# Patient Record
Sex: Female | Born: 1975 | Race: White | Hispanic: No | Marital: Married | Smoking: Former smoker
Health system: Southern US, Community
[De-identification: ages and names within clinical notes are randomized; demographics above are authoritative.]

## PROBLEM LIST (undated history)

## (undated) DIAGNOSIS — L309 Dermatitis, unspecified: Secondary | ICD-10-CM

## (undated) DIAGNOSIS — K5000 Crohn's disease of small intestine without complications: Secondary | ICD-10-CM

## (undated) DIAGNOSIS — E538 Deficiency of other specified B group vitamins: Secondary | ICD-10-CM

## (undated) DIAGNOSIS — F329 Major depressive disorder, single episode, unspecified: Secondary | ICD-10-CM

## (undated) DIAGNOSIS — M1990 Unspecified osteoarthritis, unspecified site: Secondary | ICD-10-CM

## (undated) DIAGNOSIS — G35 Multiple sclerosis: Secondary | ICD-10-CM

## (undated) DIAGNOSIS — B029 Zoster without complications: Secondary | ICD-10-CM

## (undated) DIAGNOSIS — M8580 Other specified disorders of bone density and structure, unspecified site: Secondary | ICD-10-CM

## (undated) DIAGNOSIS — G709 Myoneural disorder, unspecified: Secondary | ICD-10-CM

## (undated) DIAGNOSIS — S62009A Unspecified fracture of navicular [scaphoid] bone of unspecified wrist, initial encounter for closed fracture: Secondary | ICD-10-CM

## (undated) DIAGNOSIS — D689 Coagulation defect, unspecified: Secondary | ICD-10-CM

## (undated) DIAGNOSIS — N879 Dysplasia of cervix uteri, unspecified: Secondary | ICD-10-CM

## (undated) DIAGNOSIS — F32A Depression, unspecified: Secondary | ICD-10-CM

## (undated) DIAGNOSIS — B977 Papillomavirus as the cause of diseases classified elsewhere: Secondary | ICD-10-CM

## (undated) DIAGNOSIS — Z124 Encounter for screening for malignant neoplasm of cervix: Secondary | ICD-10-CM

## (undated) DIAGNOSIS — Z973 Presence of spectacles and contact lenses: Secondary | ICD-10-CM

## (undated) DIAGNOSIS — J45909 Unspecified asthma, uncomplicated: Secondary | ICD-10-CM

## (undated) DIAGNOSIS — F419 Anxiety disorder, unspecified: Secondary | ICD-10-CM

## (undated) DIAGNOSIS — D649 Anemia, unspecified: Secondary | ICD-10-CM

## (undated) DIAGNOSIS — F411 Generalized anxiety disorder: Secondary | ICD-10-CM

## (undated) DIAGNOSIS — I82629 Acute embolism and thrombosis of deep veins of unspecified upper extremity: Secondary | ICD-10-CM

## (undated) DIAGNOSIS — K5090 Crohn's disease, unspecified, without complications: Secondary | ICD-10-CM

## (undated) DIAGNOSIS — I2699 Other pulmonary embolism without acute cor pulmonale: Secondary | ICD-10-CM

## (undated) DIAGNOSIS — G35D Multiple sclerosis, unspecified: Secondary | ICD-10-CM

## (undated) DIAGNOSIS — J302 Other seasonal allergic rhinitis: Secondary | ICD-10-CM

## (undated) DIAGNOSIS — Z87440 Personal history of urinary (tract) infections: Secondary | ICD-10-CM

## (undated) DIAGNOSIS — E785 Hyperlipidemia, unspecified: Secondary | ICD-10-CM

## (undated) HISTORY — DX: Anxiety disorder, unspecified: F41.9

## (undated) HISTORY — DX: Zoster without complications: B02.9

## (undated) HISTORY — DX: Acute embolism and thrombosis of deep veins of unspecified upper extremity: I82.629

## (undated) HISTORY — DX: Unspecified fracture of navicular (scaphoid) bone of unspecified wrist, initial encounter for closed fracture: S62.009A

## (undated) HISTORY — DX: Other specified disorders of bone density and structure, unspecified site: M85.80

## (undated) HISTORY — DX: Generalized anxiety disorder: F41.1

## (undated) HISTORY — DX: Dysplasia of cervix uteri, unspecified: N87.9

## (undated) HISTORY — DX: Anemia, unspecified: D64.9

## (undated) HISTORY — PX: WISDOM TOOTH EXTRACTION: SHX21

## (undated) HISTORY — DX: Depression, unspecified: F32.A

## (undated) HISTORY — DX: Other pulmonary embolism without acute cor pulmonale: I26.99

## (undated) HISTORY — DX: Hyperlipidemia, unspecified: E78.5

## (undated) HISTORY — DX: Crohn's disease of small intestine without complications: K50.00

## (undated) HISTORY — DX: Encounter for screening for malignant neoplasm of cervix: Z12.4

## (undated) HISTORY — PX: COLONOSCOPY: SHX174

## (undated) HISTORY — DX: Papillomavirus as the cause of diseases classified elsewhere: B97.7

## (undated) HISTORY — DX: Other seasonal allergic rhinitis: J30.2

## (undated) HISTORY — DX: Presence of spectacles and contact lenses: Z97.3

## (undated) HISTORY — DX: Unspecified osteoarthritis, unspecified site: M19.90

## (undated) HISTORY — DX: Personal history of urinary (tract) infections: Z87.440

## (undated) HISTORY — DX: Dermatitis, unspecified: L30.9

## (undated) HISTORY — PX: APPENDECTOMY: SHX54

## (undated) HISTORY — DX: Deficiency of other specified B group vitamins: E53.8

## (undated) HISTORY — DX: Unspecified asthma, uncomplicated: J45.909

## (undated) HISTORY — DX: Major depressive disorder, single episode, unspecified: F32.9

## (undated) HISTORY — DX: Coagulation defect, unspecified: D68.9

## (undated) HISTORY — PX: ESOPHAGOGASTRODUODENOSCOPY: SHX1529

---

## 2004-11-01 HISTORY — PX: HEMICOLECTOMY: SHX854

## 2007-11-02 HISTORY — PX: CERVICAL BIOPSY  W/ LOOP ELECTRODE EXCISION: SUR135

## 2009-11-01 HISTORY — PX: LEEP: SHX91

## 2011-09-29 DIAGNOSIS — M8580 Other specified disorders of bone density and structure, unspecified site: Secondary | ICD-10-CM

## 2011-09-29 HISTORY — DX: Other specified disorders of bone density and structure, unspecified site: M85.80

## 2011-10-02 DIAGNOSIS — Z124 Encounter for screening for malignant neoplasm of cervix: Secondary | ICD-10-CM

## 2011-10-02 HISTORY — DX: Encounter for screening for malignant neoplasm of cervix: Z12.4

## 2011-11-02 DIAGNOSIS — S62009A Unspecified fracture of navicular [scaphoid] bone of unspecified wrist, initial encounter for closed fracture: Secondary | ICD-10-CM

## 2011-11-02 HISTORY — DX: Unspecified fracture of navicular (scaphoid) bone of unspecified wrist, initial encounter for closed fracture: S62.009A

## 2012-05-18 ENCOUNTER — Encounter: Payer: Self-pay | Admitting: Internal Medicine

## 2012-06-19 ENCOUNTER — Encounter: Payer: Self-pay | Admitting: Internal Medicine

## 2012-06-19 ENCOUNTER — Other Ambulatory Visit (INDEPENDENT_AMBULATORY_CARE_PROVIDER_SITE_OTHER): Payer: 59

## 2012-06-19 ENCOUNTER — Ambulatory Visit (INDEPENDENT_AMBULATORY_CARE_PROVIDER_SITE_OTHER): Payer: 59 | Admitting: Internal Medicine

## 2012-06-19 VITALS — BP 102/66 | HR 76 | Ht 63.5 in | Wt 173.0 lb

## 2012-06-19 DIAGNOSIS — Z7960 Long term (current) use of unspecified immunomodulators and immunosuppressants: Secondary | ICD-10-CM

## 2012-06-19 DIAGNOSIS — E669 Obesity, unspecified: Secondary | ICD-10-CM

## 2012-06-19 DIAGNOSIS — N879 Dysplasia of cervix uteri, unspecified: Secondary | ICD-10-CM

## 2012-06-19 DIAGNOSIS — K5000 Crohn's disease of small intestine without complications: Secondary | ICD-10-CM

## 2012-06-19 DIAGNOSIS — E538 Deficiency of other specified B group vitamins: Secondary | ICD-10-CM

## 2012-06-19 DIAGNOSIS — M8580 Other specified disorders of bone density and structure, unspecified site: Secondary | ICD-10-CM

## 2012-06-19 DIAGNOSIS — Z79899 Other long term (current) drug therapy: Secondary | ICD-10-CM

## 2012-06-19 DIAGNOSIS — M949 Disorder of cartilage, unspecified: Secondary | ICD-10-CM

## 2012-06-19 DIAGNOSIS — M899 Disorder of bone, unspecified: Secondary | ICD-10-CM

## 2012-06-19 DIAGNOSIS — K5090 Crohn's disease, unspecified, without complications: Secondary | ICD-10-CM

## 2012-06-19 HISTORY — DX: Deficiency of other specified B group vitamins: E53.8

## 2012-06-19 LAB — CBC WITH DIFFERENTIAL/PLATELET
Basophils Absolute: 0 10*3/uL (ref 0.0–0.1)
Eosinophils Absolute: 0.1 10*3/uL (ref 0.0–0.7)
MCHC: 33.2 g/dL (ref 30.0–36.0)
MCV: 102.9 fl — ABNORMAL HIGH (ref 78.0–100.0)
Monocytes Absolute: 0.7 10*3/uL (ref 0.1–1.0)
Neutrophils Relative %: 67.4 % (ref 43.0–77.0)
Platelets: 326 10*3/uL (ref 150.0–400.0)
RDW: 14.1 % (ref 11.5–14.6)
WBC: 6.7 10*3/uL (ref 4.5–10.5)

## 2012-06-19 LAB — AST: AST: 18 U/L (ref 0–37)

## 2012-06-19 LAB — VITAMIN B12: Vitamin B-12: 72 pg/mL — ABNORMAL LOW (ref 211–911)

## 2012-06-19 NOTE — Progress Notes (Signed)
Quick Note:  B12 is very low can tell her other results in so far also (are ok) Needs 1000 micrograms IM weekly x 4 starting this week then recheck level and will advise regimen (will be at least every 3 months)  ______

## 2012-06-19 NOTE — Patient Instructions (Addendum)
Please go to the basement for labs to be drawn.  We will contact you with plans once the labs are in.  Call us several days before azathioprine needs refilling.  Thank you for choosing me and [facility].  Gatha Mayer, M.D., [facility]

## 2012-06-20 ENCOUNTER — Other Ambulatory Visit: Payer: Self-pay

## 2012-06-20 DIAGNOSIS — E538 Deficiency of other specified B group vitamins: Secondary | ICD-10-CM

## 2012-06-20 MED ORDER — CYANOCOBALAMIN 1000 MCG/ML IJ SOLN
1000.0000 ug | INTRAMUSCULAR | Status: AC
  Administered 2012-06-22 – 2012-06-29 (×2): 1000 ug via INTRAMUSCULAR

## 2012-06-21 ENCOUNTER — Encounter: Payer: Self-pay | Admitting: Internal Medicine

## 2012-06-21 NOTE — Telephone Encounter (Signed)
This encounter was created in error - please disregard.

## 2012-06-22 ENCOUNTER — Ambulatory Visit (INDEPENDENT_AMBULATORY_CARE_PROVIDER_SITE_OTHER): Payer: 59 | Admitting: Internal Medicine

## 2012-06-22 DIAGNOSIS — E538 Deficiency of other specified B group vitamins: Secondary | ICD-10-CM

## 2012-06-24 ENCOUNTER — Encounter: Payer: Self-pay | Admitting: Internal Medicine

## 2012-06-24 DIAGNOSIS — K5000 Crohn's disease of small intestine without complications: Secondary | ICD-10-CM

## 2012-06-24 DIAGNOSIS — Z7960 Long term (current) use of unspecified immunomodulators and immunosuppressants: Secondary | ICD-10-CM | POA: Insufficient documentation

## 2012-06-24 DIAGNOSIS — Z79899 Other long term (current) drug therapy: Secondary | ICD-10-CM | POA: Insufficient documentation

## 2012-06-24 DIAGNOSIS — N879 Dysplasia of cervix uteri, unspecified: Secondary | ICD-10-CM | POA: Insufficient documentation

## 2012-06-24 HISTORY — DX: Crohn's disease of small intestine without complications: K50.00

## 2012-06-24 NOTE — Assessment & Plan Note (Signed)
Discovered with today's labs. Will initiate injection therapy of vitamin B12 1000 mcg weekly for 4 weeks and then determine a one or every 3 months scheduled pending that.

## 2012-06-24 NOTE — Assessment & Plan Note (Signed)
Continue calcium and vitamin D inject vitamin D level today.

## 2012-06-24 NOTE — Progress Notes (Addendum)
Subjective:    Patient ID: Lindsay Mccarthy, female    DOB: Mar 16, 1976, 36 y.o.   MRN: [id number]  HPI This 36 year old divorced white woman "Lindsay Mccarthy"presents with her significant other boyfriend to establish care for long-standing Crohn's ileitis. Details of her Crohn's history are outlined below. She reports that she is doing well, though she has some intermittent diarrhea and align will help that. He's had some intermittent bleeding from hemorrhoids at times. Overall she has responded well to Cimzia and then azathioprine which was added. She had her last flare at that time and she had been using a lot of ibuprofen for back pain she reports. Stressful also cause flare of symptoms. She stresses that she feels well on her current therapy and would like to maintain this and avoid further symptomatic flares Diagnosed 1999, in [place]. Ileitis only then. Treated with Imuran Remicade and prednisone.  Noncompliant with therapy for a period of time. 2004 return to care and was treated with Remicade prednisone Pentasa Cipro and Flagyl. 2006 status post right hemicolectomy. Subsequently treated with azathioprine and Cimzia. 200 mg every other week.  azathioprine was added in 2010. Prometheus TP MT enzyme was negative. Has had Joint complaints, microcytic anemia. Does not tolerate high dose prednisone "crazy", hallucinatory.  Last colonoscopy may 2010, status post hemicolectomy, terminal ileal ulcerations without stricture or stenosis found. Biopsies consistent with Crohn's ileitis.  EGD 11 2009, moderate chronic gastritis, negative H. pylori, small bowel looks normal but had intraepithelial lymphocytosis with normal villous architecture  DEXA scan 09/29/2011, osteopenia in the femur neck with the T score of -1.4 otherwise normal. Allergies  Allergen Reactions  . Prednisone     "crazy", hallucinatory   No outpatient prescriptions prior to visit.   No facility-administered medications prior to visit.    Past Medical History  Diagnosis Date  . Asthma     Childhood  . Hyperlipemia   . Cervical dysplasia   . Crohn's disease of small intestine 06/24/2012    Diagnosed 1999, in [place]. Ileitis only then. Treated with Imuran Remicade and prednisone.  Noncompliant with therapy. 2004 return to care and was treated with Remicade prednisone Pentasa Cipro and Flagyl. 2006 status post right hemicolectomy. Subsequently treated with azathioprine and Cimzia. 200 mg every other week.  azathioprine was added in 2010. Prometheus TP MT enzyme was negative. Has ha  . Osteopenia of femur neck T. score -1.4 09/29/2011   Past Surgical History  Procedure Date  . Leep   . Colonoscopy multiple    scanned  . Esophagogastroduodenoscopy multiple    scanned  . Hemicolectomy 2006   History   Social History  . Marital Status: Single    Spouse Name: N/A    Number of Children: N/A  . Years of Education: N/A   Occupational History  . Quality Analyst    Social History Main Topics  . Smoking status: Former Research scientist (life sciences)  . Smokeless tobacco: Never Used  . Alcohol Use: No  . Drug Use: No  . Sexually Active: None   Other Topics Concern  . None   Social History Narrative   The patient is divorced and has a significant other.No childrenQuality  [employer].Moved from [place] to [place] in 2013.Past smokerNo alcohol2 caffeinated beverages a dayShe reports she is compliant with sunscreen given her increased risk of son damage and skin cancer on azathioprineUpdated June 19, 2012   Family History  Problem Relation Age of Onset  . Breast cancer Mother   . Colon polyps Father   .  Colon cancer Paternal Grandfather         Review of Systems This is positive for things mentioned in the history of present illness. All other review of systems negative.    Objective:   Physical Exam General:  Well-developed, well-nourished and in no acute distress- obese Eyes:  anicteric. ENT:   Mouth and  posterior pharynx free of lesions.  Neck:   supple w/o thyromegaly or mass.  Lungs: Clear to auscultation bilaterally. Heart:  S1S2, no rubs, murmurs, gallops. Abdomen:  soft, non-tender, no hepatosplenomegaly, hernia, or mass and BS+. , well healed surgical scar Rectal: declined Lymph:  no cervical or supraclavicular adenopathy. Extremities:   no edema Skin   no rash. Neuro:  A&O x 3.  Psych:  appropriate mood and  Affect.   Data Reviewed: Office notes, labs, colonoscopy report, endoscopy reports, pathology reports, DEXA scan. From [place] dating from 2006 to the present.     Assessment & Plan:   1.  Crohn's disease of the small intestine   2. Long-term use of immunosuppressant medication-Azathioprine and Cimzia   3. Osteopenia   4.   5. Osteopenia of femur neck T. score -1.4   6. Cervical dysplasia s/p LEEP   7. Vitamin B12 deficiency    1. establish care today, CBC, comprehensive metabolic panel, X52 level, and vitamin D level HAV total antibody 2. Continue Cimzia and azathioprine 3. We will coordinate with her specialty pharmacy refill Cimzia. Azathioprine as refilled today. 4. She is due for tuberculosis testing in the fall of this year. November. 5. [employer] member she provided to the patient. 6. She will need a Pneumovax 7. Needs to establish gynecology care 8. Needs to establish primary care 9. Start B12 therapy as vitamin B12 was low  10. Please also see problem oriented charting which is extensive and can be viewed under the encounter section.  She also has hypertriglyceridemia at least did in 2011 with level of 753.  Will follow-up with her by phone on this.

## 2012-06-24 NOTE — Progress Notes (Signed)
Quick Note:  Let her know that vit D level ok  She needs hepatitis A vaccine and pneumonia vaccine  Repeat CBC, AST and ALT in 4 months  REV recall 1 year ______

## 2012-06-24 NOTE — Assessment & Plan Note (Signed)
She will need to continue monitoring of her cervix with her gynecologist. Have advised sunscreen used to reduce the risk of skin cancer and she says she is compliant We'll continue CBC and liver tests approximately every 3-4 months.

## 2012-06-24 NOTE — Assessment & Plan Note (Signed)
Does not have primary care or gynecology. Will need to arrange.

## 2012-06-26 ENCOUNTER — Other Ambulatory Visit: Payer: Self-pay

## 2012-06-26 MED ORDER — CERTOLIZUMAB PEGOL 2 X 200 MG ~~LOC~~ KIT: 1.0000 | PACK | SUBCUTANEOUS | Status: DC

## 2012-06-27 ENCOUNTER — Other Ambulatory Visit: Payer: Self-pay

## 2012-06-27 DIAGNOSIS — K5090 Crohn's disease, unspecified, without complications: Secondary | ICD-10-CM

## 2012-06-27 DIAGNOSIS — Z23 Encounter for immunization: Secondary | ICD-10-CM

## 2012-06-27 MED ORDER — PNEUMOCOCCAL VAC POLYVALENT 25 MCG/0.5ML IJ INJ: 0.5000 mL | INJECTION | Freq: Once | INTRAMUSCULAR | Status: DC

## 2012-06-29 ENCOUNTER — Ambulatory Visit (INDEPENDENT_AMBULATORY_CARE_PROVIDER_SITE_OTHER): Payer: 59 | Admitting: Internal Medicine

## 2012-06-29 DIAGNOSIS — E538 Deficiency of other specified B group vitamins: Secondary | ICD-10-CM

## 2012-06-29 DIAGNOSIS — Z23 Encounter for immunization: Secondary | ICD-10-CM

## 2012-06-29 MED ORDER — CYANOCOBALAMIN 1000 MCG/ML IJ SOLN
1000.0000 ug | Freq: Once | INTRAMUSCULAR | Status: AC
  Administered 2012-09-05: 1000 ug via INTRAMUSCULAR

## 2012-07-03 ENCOUNTER — Encounter: Payer: Self-pay | Admitting: Internal Medicine

## 2012-07-03 DIAGNOSIS — E781 Pure hyperglyceridemia: Secondary | ICD-10-CM | POA: Insufficient documentation

## 2012-07-03 HISTORY — DX: Pure hyperglyceridemia: E78.1

## 2012-07-05 ENCOUNTER — Telehealth: Payer: Self-pay

## 2012-07-05 NOTE — Telephone Encounter (Signed)
Left message for patient to call back  

## 2012-07-05 NOTE — Telephone Encounter (Signed)
Message copied by Marlon Pel on Wed Jul 05, 2012 10:53 AM ------      Message from: Silvano Rusk E      Created: Mon Jul 03, 2012 10:57 AM      Regarding: high TG's       Outside records show very high triglyceride level in 2011            This can lead to severe pancreatitis and other problems            Needs this rechecked - preferrably by a PCP but we can do initially if she does not have one yet - needs fasting lipid panel

## 2012-07-05 NOTE — Telephone Encounter (Signed)
She reports that she has had that rechecked in Feb of this year.  They were only slightly elevated at that time.  It was determined that in 2011 they were elevated due to a medication (she can't remember what it was) they had her stop it.  She will try and have a copy of the Feb labs sent here.

## 2012-07-05 NOTE — Telephone Encounter (Signed)
[place] like she has it covered

## 2012-07-06 ENCOUNTER — Ambulatory Visit (INDEPENDENT_AMBULATORY_CARE_PROVIDER_SITE_OTHER): Payer: 59 | Admitting: Internal Medicine

## 2012-07-06 DIAGNOSIS — E538 Deficiency of other specified B group vitamins: Secondary | ICD-10-CM

## 2012-07-06 MED ORDER — CYANOCOBALAMIN 1000 MCG/ML IJ SOLN
1000.0000 ug | Freq: Once | INTRAMUSCULAR | Status: AC
  Administered 2012-07-06: 1000 ug via INTRAMUSCULAR

## 2012-07-13 ENCOUNTER — Ambulatory Visit (INDEPENDENT_AMBULATORY_CARE_PROVIDER_SITE_OTHER): Payer: 59 | Admitting: Internal Medicine

## 2012-07-13 DIAGNOSIS — E538 Deficiency of other specified B group vitamins: Secondary | ICD-10-CM

## 2012-07-13 MED ORDER — CYANOCOBALAMIN 1000 MCG/ML IJ SOLN
1000.0000 ug | INTRAMUSCULAR | Status: DC
  Administered 2012-07-13: 1000 ug via INTRAMUSCULAR

## 2012-07-20 ENCOUNTER — Other Ambulatory Visit (INDEPENDENT_AMBULATORY_CARE_PROVIDER_SITE_OTHER): Payer: 59

## 2012-07-20 DIAGNOSIS — E538 Deficiency of other specified B group vitamins: Secondary | ICD-10-CM

## 2012-07-20 LAB — VITAMIN B12: Vitamin B-12: 274 pg/mL (ref 211–911)

## 2012-07-21 NOTE — Progress Notes (Signed)
Quick Note:  Please arrange for monthly B12 injection  1000 ug IM each month ______

## 2012-07-24 ENCOUNTER — Other Ambulatory Visit: Payer: Self-pay

## 2012-07-24 MED ORDER — CERTOLIZUMAB PEGOL 2 X 200 MG/ML ~~LOC~~ KIT: 200.0000 mg | PACK | SUBCUTANEOUS | Status: DC

## 2012-08-04 ENCOUNTER — Ambulatory Visit (INDEPENDENT_AMBULATORY_CARE_PROVIDER_SITE_OTHER): Payer: 59 | Admitting: Internal Medicine

## 2012-08-04 DIAGNOSIS — E538 Deficiency of other specified B group vitamins: Secondary | ICD-10-CM

## 2012-08-04 MED ORDER — CYANOCOBALAMIN 1000 MCG/ML IJ SOLN
1000.0000 ug | INTRAMUSCULAR | Status: DC
  Administered 2012-08-04: 1000 ug via INTRAMUSCULAR

## 2012-08-16 ENCOUNTER — Other Ambulatory Visit: Payer: Self-pay | Admitting: Internal Medicine

## 2012-09-05 ENCOUNTER — Ambulatory Visit (INDEPENDENT_AMBULATORY_CARE_PROVIDER_SITE_OTHER): Payer: 59 | Admitting: Internal Medicine

## 2012-09-05 DIAGNOSIS — E538 Deficiency of other specified B group vitamins: Secondary | ICD-10-CM

## 2012-10-03 ENCOUNTER — Ambulatory Visit (INDEPENDENT_AMBULATORY_CARE_PROVIDER_SITE_OTHER): Payer: 59 | Admitting: Medical

## 2012-10-03 ENCOUNTER — Encounter: Payer: Self-pay | Admitting: Medical

## 2012-10-03 VITALS — BP 100/78 | HR 76 | Temp 98.0°F | Resp 16 | Ht 64.0 in | Wt 176.0 lb

## 2012-10-03 DIAGNOSIS — J309 Allergic rhinitis, unspecified: Secondary | ICD-10-CM

## 2012-10-03 DIAGNOSIS — Z0000 Encounter for general adult medical examination without abnormal findings: Secondary | ICD-10-CM

## 2012-10-03 DIAGNOSIS — K5090 Crohn's disease, unspecified, without complications: Secondary | ICD-10-CM

## 2012-10-03 DIAGNOSIS — Z124 Encounter for screening for malignant neoplasm of cervix: Secondary | ICD-10-CM

## 2012-10-03 LAB — LIPID PANEL
Cholesterol: 111 mg/dL (ref 0–200)
HDL: 43 mg/dL (ref >39)
Total CHOL/HDL Ratio: 2.6 Ratio
Triglycerides: 275 mg/dL — ABNORMAL HIGH (ref <150)

## 2012-10-03 LAB — COMPREHENSIVE METABOLIC PANEL
ALT: 8 U/L (ref 0–35)
BUN: 7 mg/dL (ref 6–23)
CO2: 24 mEq/L (ref 19–32)
Creat: 0.72 mg/dL (ref 0.50–1.10)
Glucose, Bld: 75 mg/dL (ref 70–99)
Total Bilirubin: 0.6 mg/dL (ref 0.3–1.2)

## 2012-10-03 LAB — TSH: TSH: 0.769 u[IU]/mL (ref 0.350–4.500)

## 2012-10-03 LAB — POCT URINALYSIS DIPSTICK
Leukocytes, UA: NEGATIVE
Nitrite, UA: NEGATIVE
Protein, UA: NEGATIVE
pH, UA: 5

## 2012-10-03 MED ORDER — BUPROPION HCL ER (XL) 150 MG PO TB24: 150.0000 mg | ORAL_TABLET | Freq: Every day | ORAL | Status: DC

## 2012-10-03 NOTE — Progress Notes (Signed)
Subjective:   HPI  Lindsay Mccarthy is a 36 y.o. female who presents for a complete physical.  New patient today.  Moved here from [place] 06/2012.     Preventative care: Last ophthalmology visit:n/a Last dental visit:does but doesn't have one in [place] yet Last mammogram:n/a Last gynecological exam:10/2011 Last EKG:2011 Last labs:06/2012  Prior vaccinations: TD or Tdap:8 yrs ago Influenza:2 months ago  Pneumococcal: 07/06/2012 Shingles/Zostavax:n/a  Concerns: Has seasonal allergies, worse spring and fall, but since moving to [place], having issues with allergies.  Benadryl not helping.  Wants to try something else but doesn't like nasal sprays.  Has used OTC afrin and other OTC nasal sprays.  Since starting on her current crohn's medications, seems to have increase in noticeable moles and spots on her arms.  Otherwise been feeling fine, in usual state of health.     Reviewed their medical, surgical, family, social, medication, and allergy history and updated chart as appropriate.   Past Medical History  Diagnosis Date  . Asthma     Childhood  . Hyperlipemia   . Cervical dysplasia   . Crohn's disease of small intestine 06/24/2012    Diagnosed 1999, in [place]. Ileitis only then. Treated with Imuran Remicade and prednisone.  Noncompliant with therapy. 2004 return to care and was treated with Remicade prednisone Pentasa Cipro and Flagyl. 2006 status post right hemicolectomy. Subsequently treated with azathioprine and Cimzia. 200 mg every other week.  azathioprine was added in 2010. Prometheus TP MT enzyme was negative. Has ha  . Osteopenia of femur neck T. score -1.4 09/29/2011  . Anemia     related to Crohns flares  . Seasonal allergic rhinitis   . History of recurrent UTI (urinary tract infection)   . Eczema     arms and behind knees, worse in winter  . Wears glasses   . Scaphoid fracture of wrist 2013    left  . Arthritis     knees, feet, hands, wrists, related to  Crohns flare  . HPV (human papilloma virus) infection   . Papanicolaou smear 12/12    last abnormal 2011  . History of mammogram     never; recommended first screening age 85yo    Past Surgical History  Procedure Date  . Leep   . Colonoscopy multiple    scanned  . Esophagogastroduodenoscopy multiple    scanned  . Hemicolectomy 2006  . Appendectomy   . Wisdom tooth extraction     Family History  Problem Relation Age of Onset  . Breast cancer Mother 57  . Hypertension Mother   . Colon polyps Father   . Diabetes Father     borderline  . Hypertension Father   . Colon cancer Paternal Grandfather   . Cancer Paternal Grandfather     colon  . Multiple sclerosis Sister   . Stroke Maternal Grandmother   . Cancer Maternal Grandmother     lung  . Heart disease Paternal Grandmother     History   Social History  . Marital Status: Single    Spouse Name: N/A    Number of Children: N/A  . Years of Education: N/A   Occupational History  . Quality Analyst    Social History Main Topics  . Smoking status: Former Research scientist (life sciences)  . Smokeless tobacco: Never Used  . Alcohol Use: Yes     Comment: occasional  . Drug Use: No  . Sexually Active: Not on file   Other Topics Concern  . Not on file  Social History Narrative   The patient is divorced and has a significant other.No children - doesn't want anyQuality  [employer].Moved from [place] to [place] in 2013.Past smokerNo alcohol2 caffeinated beverages a dayShe reports she is compliant with sunscreen given her increased risk of son damage and skin cancer on azathioprineUpdated August 19, 2013Exercise: 3-5 x per week at the gym    Current Outpatient Prescriptions on File Prior to Visit  Medication Sig Dispense Refill  . azaTHIOprine (IMURAN) 50 MG tablet TAKE 4 TABLETS BY MOUTH DAILY  360 tablet  1  . Calcium Carbonate-Vitamin D (CALCIUM 600+D) 600-400 MG-UNIT per tablet Take 1 tablet by mouth daily.      .  Certolizumab Pegol (CIMZIA PREFILLED) 2 X 200 MG/ML KIT Inject 200 mg into the skin every 14 (fourteen) days.  1 kit  6  . DiphenhydrAMINE HCl, Sleep, 25 MG CAPS Take 1 mg by mouth at bedtime and may repeat dose one time if needed.      Marland Kitchen levonorgestrel (MIRENA) 20 MCG/24HR IUD 1 each by Intrauterine route once.      Marland Kitchen buPROPion (WELLBUTRIN XL) 150 MG 24 hr tablet Take 1 tablet (150 mg total) by mouth daily.  30 tablet  3   Current Facility-Administered Medications on File Prior to Visit  Medication Dose Route Frequency Provider Last Rate Last Dose  . pneumococcal 23 valent vaccine (PNU-IMMUNE) injection 0.5 mL  0.5 mL Intramuscular Once Gatha Mayer, MD        Allergies  Allergen Reactions  . Ibuprofen     Avoids due to Crohns disease  . Prednisone     "crazy", hallucinatory     Review of Systems Constitutional: -fever, -chills, -sweats, -unexpected weight change, -decreased appetite, -fatigue Allergy: -sneezing, -itching, -congestion, +allergy Dermatology: -changing moles, --rash, -lumps,+ getting more moles ENT: -runny nose, -ear pain, -sore throat, -hoarseness, -sinus pain, -teeth pain, - ringing in ears, -hearing loss, -nosebleeds Cardiology: -chest pain, -palpitations, -swelling, -difficulty breathing when lying flat, -waking up short of breath Respiratory: -cough, -shortness of breath, -difficulty breathing with exercise or exertion, -wheezing, -coughing up blood Gastroenterology: -abdominal pain, -nausea, -vomiting, +diarrhea, -constipation, -blood in stool, -changes in bowel movement, -difficulty swallowing or eating Hematology: -bleeding, -bruising  Musculoskeletal: -joint aches, -muscle aches, -joint swelling, -back pain, -neck pain, -cramping, -changes in gait Ophthalmology: denies vision changes, eye redness, itching, discharge Urology: -burning with urination, -difficulty urinating, -blood in urine, -urinary frequency, -urgency, -incontinence Neurology: -headache,  -weakness, -tingling, -numbness, -memory loss, +falls, -dizziness Psychology: +depressed mood, -agitation, +sleep problems     Objective:   Physical Exam  Reviewed nurse notes and vitals  General appearance: alert, no distress, WD/WN, white female Skin: right upper back with 62m raised pink round papule, benign appearing, scattered small pinpoint to 17mround erythematous flat macules on bilat arms, upper chest, no specific worrisome lesions HEENT: normocephalic, conjunctiva/corneas normal, sclerae anicteric, PERRLA, EOMi, nares patent, no discharge or erythema, pharynx normal Oral cavity: MMM, tongue normal, teeth in good repair Neck: supple, no lymphadenopathy, no thyromegaly, no masses, normal ROM, no bruits Chest: non tender, normal shape and expansion Heart: RRR, normal S1, S2, no murmurs Lungs: CTA bilaterally, no wheezes, rhonchi, or rales Abdomen: +bs, soft, periumbilical surgical scare, non tender, non distended, no masses, no hepatomegaly, no splenomegaly, no bruits Back: non tender, normal ROM, no scoliosis Musculoskeletal: upper extremities non tender, no obvious deformity, normal ROM throughout, lower extremities non tender, no obvious deformity, normal ROM throughout Extremities: no edema, no  cyanosis, no clubbing Pulses: 2+ symmetric, upper and lower extremities, normal cap refill Neurological: alert, oriented x 3, CN2-12 intact, strength normal upper extremities and lower extremities, sensation normal throughout, DTRs 2+ throughout, no cerebellar signs, gait normal Psychiatric: normal affect, behavior normal, pleasant  Breast/gyn/rectal - deferred   Assessment and Plan :    Encounter Diagnoses  Name Primary?  . Routine general medical examination at a health care facility Yes  . Allergic rhinitis   . Crohn disease   . Screening for cervical cancer    Physical exam - discussed healthy lifestyle, diet, exercise, preventative care, vaccinations, and addressed their  concerns.  Handout given.  HIV test today.  reviwed recent labs through gastroenterology.  additional labs today at baseline.  Allergic rhinitis - begin trial of OTC Zyrtec QHS.  Avoid triggers if possible.  Crohns - managed by Dr. Carlean Purl.  Screening for cervical cancer - she will return here soon for gyn exam with Dr. Tomi Bamberger, female provider at her request.  Follow-up pending labs

## 2012-10-05 ENCOUNTER — Ambulatory Visit (INDEPENDENT_AMBULATORY_CARE_PROVIDER_SITE_OTHER): Payer: 59 | Admitting: Internal Medicine

## 2012-10-05 ENCOUNTER — Other Ambulatory Visit (INDEPENDENT_AMBULATORY_CARE_PROVIDER_SITE_OTHER): Payer: 59

## 2012-10-05 DIAGNOSIS — K5090 Crohn's disease, unspecified, without complications: Secondary | ICD-10-CM

## 2012-10-05 DIAGNOSIS — E538 Deficiency of other specified B group vitamins: Secondary | ICD-10-CM

## 2012-10-05 LAB — CBC WITH DIFFERENTIAL/PLATELET
Basophils Absolute: 0 10*3/uL (ref 0.0–0.1)
HCT: 38.2 % (ref 36.0–46.0)
Hemoglobin: 12.8 g/dL (ref 12.0–15.0)
Lymphs Abs: 1.2 10*3/uL (ref 0.7–4.0)
MCHC: 33.6 g/dL (ref 30.0–36.0)
MCV: 101.2 fl — ABNORMAL HIGH (ref 78.0–100.0)
Monocytes Absolute: 0.5 10*3/uL (ref 0.1–1.0)
Neutro Abs: 5.4 10*3/uL (ref 1.4–7.7)
Platelets: 330 10*3/uL (ref 150.0–400.0)
RDW: 14.3 % (ref 11.5–14.6)

## 2012-10-05 LAB — ALT: ALT: 10 U/L (ref 0–35)

## 2012-10-05 LAB — AST: AST: 17 U/L (ref 0–37)

## 2012-10-05 MED ORDER — CYANOCOBALAMIN 1000 MCG/ML IJ SOLN
1000.0000 ug | Freq: Once | INTRAMUSCULAR | Status: DC
  Administered 2012-10-05: 1000 ug via INTRAMUSCULAR

## 2012-10-05 NOTE — Progress Notes (Signed)
Quick Note:  Labs are ok Repeat same in 4 months April 2014 ______

## 2012-10-06 ENCOUNTER — Telehealth: Payer: Self-pay

## 2012-10-06 MED ORDER — CYANOCOBALAMIN 1000 MCG/ML IJ KIT: 1.0000 mL | PACK | INTRAMUSCULAR | Status: DC

## 2012-10-06 NOTE — Telephone Encounter (Signed)
Spoke with patient and she knows how to administer injections to herself.  Per Barb Merino, RN for Dr. Silvano Rusk it is ok to send in Vitamin B-12 rx and syringes, needles to the [facility] off [address]  She will administer the B-12 injection to herself once a month.  Called in rx, canceled one sent to [facility].

## 2012-10-13 ENCOUNTER — Telehealth: Payer: Self-pay | Admitting: Internal Medicine

## 2012-10-13 ENCOUNTER — Other Ambulatory Visit: Payer: Self-pay

## 2012-10-13 DIAGNOSIS — K5000 Crohn's disease of small intestine without complications: Secondary | ICD-10-CM

## 2012-10-13 NOTE — Telephone Encounter (Signed)
Patient aware of results New labs entered for 01/2013

## 2012-10-18 ENCOUNTER — Other Ambulatory Visit (HOSPITAL_COMMUNITY)
Admission: RE | Admit: 2012-10-18 | Discharge: 2012-10-18 | Disposition: A | Discharge status: Home / Self Care | Payer: 59 | Source: Ambulatory Visit | Attending: Family Medicine | Admitting: Family Medicine

## 2012-10-18 ENCOUNTER — Encounter: Payer: Self-pay | Admitting: Family Medicine

## 2012-10-18 ENCOUNTER — Other Ambulatory Visit: Payer: 59 | Admitting: Family Medicine

## 2012-10-18 ENCOUNTER — Ambulatory Visit (INDEPENDENT_AMBULATORY_CARE_PROVIDER_SITE_OTHER): Payer: 59 | Admitting: Family Medicine

## 2012-10-18 VITALS — BP 112/80 | HR 68 | Ht 64.0 in | Wt 176.0 lb

## 2012-10-18 DIAGNOSIS — Z01419 Encounter for gynecological examination (general) (routine) without abnormal findings: Secondary | ICD-10-CM | POA: Insufficient documentation

## 2012-10-18 DIAGNOSIS — R6889 Other general symptoms and signs: Secondary | ICD-10-CM

## 2012-10-18 DIAGNOSIS — N879 Dysplasia of cervix uteri, unspecified: Secondary | ICD-10-CM

## 2012-10-18 DIAGNOSIS — E781 Pure hyperglyceridemia: Secondary | ICD-10-CM

## 2012-10-18 DIAGNOSIS — IMO0002 Reserved for concepts with insufficient information to code with codable children: Secondary | ICD-10-CM

## 2012-10-18 LAB — HM PAP SMEAR: HM Pap smear: NORMAL

## 2012-10-18 NOTE — Patient Instructions (Signed)
Baseline mammogram at [facility] or [facility]).    Continue lowfat diet as we discussed.  Fish oil 3000-4096m daily.  Hypertriglyceridemia  Diet for High blood levels of Triglycerides Most fats in food are triglycerides. Triglycerides in your blood are stored as fat in your body. High levels of triglycerides in your blood may put you at a greater risk for heart disease and stroke.  Normal triglyceride levels are less than 150 mg/dL. Borderline high levels are 150-199 mg/dl. High levels are 200 - 499 mg/dL, and very high triglyceride levels are greater than 500 mg/dL. The decision to treat high triglycerides is generally based on the level. For people with borderline or high triglyceride levels, treatment includes weight loss and exercise. Drugs are recommended for people with very high triglyceride levels. Many people who need treatment for high triglyceride levels have metabolic syndrome. This syndrome is a collection of disorders that often include: insulin resistance, high blood pressure, blood clotting problems, high cholesterol and triglycerides. TESTING PROCEDURE FOR TRIGLYCERIDES  You should not eat 4 hours before getting your triglycerides measured. The normal range of triglycerides is between 10 and 250 milligrams per deciliter (mg/dl). Some people may have extreme levels (1000 or above), but your triglyceride level may be too high if it is above 150 mg/dl, depending on what other risk factors you have for heart disease.  People with high blood triglycerides may also have high blood cholesterol levels. If you have high blood cholesterol as well as high blood triglycerides, your risk for heart disease is probably greater than if you only had high triglycerides. High blood cholesterol is one of the main risk factors for heart disease. CHANGING YOUR DIET  Your weight can affect your blood triglyceride level. If you are more than 20% above your ideal body weight, you may be able to lower your  blood triglycerides by losing weight. Eating less and exercising regularly is the best way to combat this. Fat provides more calories than any other food. The best way to lose weight is to eat less fat. Only 30% of your total calories should come from fat. Less than 7% of your diet should come from saturated fat. A diet low in fat and saturated fat is the same as a diet to decrease blood cholesterol. By eating a diet lower in fat, you may lose weight, lower your blood cholesterol, and lower your blood triglyceride level.  Eating a diet low in fat, especially saturated fat, may also help you lower your blood triglyceride level. Ask your dietitian to help you figure how much fat you can eat based on the number of calories your caregiver has prescribed for you.  Exercise, in addition to helping with weight loss may also help lower triglyceride levels.   Alcohol can increase blood triglycerides. You may need to stop drinking alcoholic beverages.  Too much carbohydrate in your diet may also increase your blood triglycerides. Some complex carbohydrates are necessary in your diet. These may include bread, rice, potatoes, other starchy vegetables and cereals.  Reduce "simple" carbohydrates. These may include pure sugars, candy, honey, and jelly without losing other nutrients. If you have the kind of high blood triglycerides that is affected by the amount of carbohydrates in your diet, you will need to eat less sugar and less high-sugar foods. Your caregiver can help you with this.  Adding 2-4 grams of fish oil (EPA+ DHA) may also help lower triglycerides. Speak with your caregiver before adding any supplements to your regimen.  Following the Diet  Maintain your ideal weight. Your caregivers can help you with a diet. Generally, eating less food and getting more exercise will help you lose weight. Joining a weight control group may also help. Ask your caregivers for a good weight control group in your area.  Eat  low-fat foods instead of high-fat foods. This can help you lose weight too.  These foods are lower in fat. Eat MORE of these:   Dried beans, peas, and lentils.  Egg whites.  Low-fat cottage cheese.  Fish.  Lean cuts of meat, such as round, sirloin, rump, and flank (cut extra fat off meat you fix).  Whole grain breads, cereals and pasta.  Skim and nonfat dry milk.  Low-fat yogurt.  Poultry without the skin.  Cheese made with skim or part-skim milk, such as mozzarella, parmesan, farmers', ricotta, or pot cheese. These are higher fat foods. Eat LESS of these:   Whole milk and foods made from whole milk, such as American, blue, cheddar, monterey jack, and swiss cheese  High-fat meats, such as luncheon meats, sausages, knockwurst, bratwurst, hot dogs, ribs, corned beef, ground pork, and regular ground beef.  Fried foods. Limit saturated fats in your diet. Substituting unsaturated fat for saturated fat may decrease your blood triglyceride level. You will need to read package labels to know which products contain saturated fats.  These foods are high in saturated fat. Eat LESS of these:   Fried pork skins.  Whole milk.  Skin and fat from poultry.  Palm oil.  Butter.  Shortening.  Cream cheese.  Berniece Salines.  Margarines and baked goods made from listed oils.  Vegetable shortenings.  Chitterlings.  Fat from meats.  Coconut oil.  Palm kernel oil.  Lard.  Cream.  Sour cream.  Fatback.  Coffee whiteners and non-dairy creamers made with these oils.  Cheese made from whole milk. Use unsaturated fats (both polyunsaturated and monounsaturated) moderately. Remember, even though unsaturated fats are better than saturated fats; you still want a diet low in total fat.  These foods are high in unsaturated fat:   Canola oil.  Sunflower oil.  Mayonnaise.  Almonds.  Peanuts.  Pine nuts.  Margarines made with these oils.  Safflower oil.  Olive  oil.  Avocados.  Cashews.  Peanut butter.  Sunflower seeds.  Soybean oil.  Peanut oil.  Olives.  Pecans.  Walnuts.  Pumpkin seeds. Avoid sugar and other high-sugar foods. This will decrease carbohydrates without decreasing other nutrients. Sugar in your food goes rapidly to your blood. When there is excess sugar in your blood, your liver may use it to make more triglycerides. Sugar also contains calories without other important nutrients.  Eat LESS of these:   Sugar, brown sugar, powdered sugar, jam, jelly, preserves, honey, syrup, molasses, pies, candy, cakes, cookies, frosting, pastries, colas, soft drinks, punches, fruit drinks, and regular gelatin.  Avoid alcohol. Alcohol, even more than sugar, may increase blood triglycerides. In addition, alcohol is high in calories and low in nutrients. Ask for sparkling water, or a diet soft drink instead of an alcoholic beverage. Suggestions for planning and preparing meals   Bake, broil, grill or roast meats instead of frying.  Remove fat from meats and skin from poultry before cooking.  Add spices, herbs, lemon juice or vinegar to vegetables instead of salt, rich sauces or gravies.  Use a non-stick skillet without fat or use no-stick sprays.  Cool and refrigerate stews and broth. Then remove the hardened fat floating on the  surface before serving.  Refrigerate meat drippings and skim off fat to make low-fat gravies.  Serve more fish.  Use less butter, margarine and other high-fat spreads on bread or vegetables.  Use skim or reconstituted non-fat dry milk for cooking.  Cook with low-fat cheeses.  Substitute low-fat yogurt or cottage cheese for all or part of the sour cream in recipes for sauces, dips or congealed salads.  Use half yogurt/half mayonnaise in salad recipes.  Substitute evaporated skim milk for cream. Evaporated skim milk or reconstituted non-fat dry milk can be whipped and substituted for whipped cream in  certain recipes.  Choose fresh fruits for dessert instead of high-fat foods such as pies or cakes. Fruits are naturally low in fat. When Dining Out   Order low-fat appetizers such as fruit or vegetable juice, pasta with vegetables or tomato sauce.  Select clear, rather than cream soups.  Ask that dressings and gravies be served on the side. Then use less of them.  Order foods that are baked, broiled, poached, steamed, stir-fried, or roasted.  Ask for margarine instead of butter, and use only a small amount.  Drink sparkling water, unsweetened tea or coffee, or diet soft drinks instead of alcohol or other sweet beverages. QUESTIONS AND ANSWERS ABOUT OTHER FATS IN THE BLOOD: SATURATED FAT, TRANS FAT, AND CHOLESTEROL What is trans fat? Trans fat is a type of fat that is formed when vegetable oil is hardened through a process called hydrogenation. This process helps makes foods more solid, gives them shape, and prolongs their shelf life. Trans fats are also called hydrogenated or partially hydrogenated oils.  What do saturated fat, trans fat, and cholesterol in foods have to do with heart disease? Saturated fat, trans fat, and cholesterol in the diet all raise the level of LDL "bad" cholesterol in the blood. The higher the LDL cholesterol, the greater the risk for coronary heart disease (CHD). Saturated fat and trans fat raise LDL similarly.  What foods contain saturated fat, trans fat, and cholesterol? High amounts of saturated fat are found in animal products, such as fatty cuts of meat, chicken skin, and full-fat dairy products like butter, whole milk, cream, and cheese, and in tropical vegetable oils such as palm, palm kernel, and coconut oil. Trans fat is found in some of the same foods as saturated fat, such as vegetable shortening, some margarines (especially hard or stick margarine), crackers, cookies, baked goods, fried foods, salad dressings, and other processed foods made with partially  hydrogenated vegetable oils. Small amounts of trans fat also occur naturally in some animal products, such as milk products, beef, and lamb. Foods high in cholesterol include liver, other organ meats, egg yolks, shrimp, and full-fat dairy products. How can I use the new food label to make heart-healthy food choices? Check the Nutrition Facts panel of the food label. Choose foods lower in saturated fat, trans fat, and cholesterol. For saturated fat and cholesterol, you can also use the Percent Daily Value (%DV): 5% DV or less is low, and 20% DV or more is high. (There is no %DV for trans fat.) Use the Nutrition Facts panel to choose foods low in saturated fat and cholesterol, and if the trans fat is not listed, read the ingredients and limit products that list shortening or hydrogenated or partially hydrogenated vegetable oil, which tend to be high in trans fat. POINTS TO REMEMBER:   Discuss your risk for heart disease with your caregivers, and take steps to reduce risk factors.  Change your diet. Choose foods that are low in saturated fat, trans fat, and cholesterol.  Add exercise to your daily routine if it is not already being done. Participate in physical activity of moderate intensity, like brisk walking, for at least 30 minutes on most, and preferably all days of the week. No time? Break the 30 minutes into three, 10-minute segments during the day.  Stop smoking. If you do smoke, contact your caregiver to discuss ways in which they can help you quit.  Do not use street drugs.  Maintain a normal weight.  Maintain a healthy blood pressure.  Keep up with your blood work for checking the fats in your blood as directed by your caregiver. Document Released: 08/05/2004 Document Revised: 04/18/2012 Document Reviewed: 03/03/2009 [facility] Patient Information 2013 [place].

## 2012-10-18 NOTE — Progress Notes (Signed)
Chief Complaint  Patient presents with  . Gynecologic Exam    pap and breast exam, CPE done by [facility] 10/03/12.   HPI: Patient presents for GYN exam.  She previously had abnormal paps, HPV.  Treated with LEEP in 2011.  Had 2 normal paps in 2012.  Last pap was 10/2011.  Has Mirena IUD, and only occasionally spots, every few months.  She has never been pregnant, and doesn't want children.  She has been in a monogamous relationship x 6 years without concern for STD.  Denies vaginal discharge, odor, itch.    Recalls having worsening lipids when she was on Nuva ring in the past.  She had lipids checked recently with CPE by Audelia Acton.  TG were elevated.  She just started on fish oil.  She has been losing weight (max of 228 in past).  Needs to eat a lot of carbs due to her Crohn's, can't eat a lot of fruit and vegetables. Has done Weight Watchers in the past.   Past Medical History  Diagnosis Date  . Asthma     Childhood  . Hyperlipemia   . Cervical dysplasia   . Crohn's disease of small intestine 06/24/2012    Diagnosed 1999, in [place]. Ileitis only then. Treated with Imuran Remicade and prednisone.  Noncompliant with therapy. 2004 return to care and was treated with Remicade prednisone Pentasa Cipro and Flagyl. 2006 status post right hemicolectomy. Subsequently treated with azathioprine and Cimzia. 200 mg every other week.  azathioprine was added in 2010. Prometheus TP MT enzyme was negative. Has ha  . Osteopenia of femur neck T. score -1.4 09/29/2011  . Anemia     related to Crohns flares  . Seasonal allergic rhinitis   . History of recurrent UTI (urinary tract infection)   . Eczema     arms and behind knees, worse in winter  . Wears glasses   . Scaphoid fracture of wrist 2013    left  . Arthritis     knees, feet, hands, wrists, related to Crohns flare  . HPV (human papilloma virus) infection   . Papanicolaou smear 12/12    last abnormal 2011  . History of mammogram     never; recommended  first screening age 69yo   Past Surgical History  Procedure Date  . Leep 2011  . Colonoscopy multiple    scanned  . Esophagogastroduodenoscopy multiple    scanned  . Hemicolectomy 2006  . Appendectomy   . Wisdom tooth extraction    History   Social History  . Marital Status: Single    Spouse Name: N/A    Number of Children: N/A  . Years of Education: N/A   Occupational History  . Quality Analyst    Social History Main Topics  . Smoking status: Former Smoker -- 1.0 packs/day for 4 years    Quit date: 11/18/1998  . Smokeless tobacco: Never Used  . Alcohol Use: Yes     Comment: occasional  . Drug Use: No  . Sexually Active: Yes -- Female partner(s)    Birth Control/ Protection: IUD   Other Topics Concern  . Not on file   Social History Narrative   The patient is divorced.  Engaged to be married, lives with fiance, 3 cats.No children - doesn't want anyQuality  [employer].Moved from [place] to [place] in 2013.Past smokerNo alcohol2 caffeinated beverages a dayShe reports she is compliant with sunscreen given her increased risk of son damage and skin cancer on azathioprineUpdated August  19, 2013Exercise: 3-5 x per week at the gym   Family History  Problem Relation Age of Onset  . Breast cancer Mother 35  . Hypertension Mother   . Cancer Mother     breast  . Colon polyps Father   . Diabetes Father     borderline  . Hypertension Father   . Colon cancer Paternal Grandfather   . Cancer Paternal Grandfather     colon  . Multiple sclerosis Sister   . Stroke Maternal Grandmother   . Cancer Maternal Grandmother     lung  . Heart disease Paternal Grandmother    Current Outpatient Prescriptions on File Prior to Visit  Medication Sig Dispense Refill  . azaTHIOprine (IMURAN) 50 MG tablet TAKE 4 TABLETS BY MOUTH DAILY  360 tablet  1  . buPROPion (WELLBUTRIN XL) 150 MG 24 hr tablet Take 1 tablet (150 mg total) by mouth daily.  30 tablet  3  . Calcium  Carbonate-Vitamin D (CALCIUM 600+D) 600-400 MG-UNIT per tablet Take 1 tablet by mouth daily.      . Certolizumab Pegol (CIMZIA PREFILLED) 2 X 200 MG/ML KIT Inject 200 mg into the skin every 14 (fourteen) days.  1 kit  6  . Cyanocobalamin 1000 MCG/ML KIT Inject 1 application as directed. Monthly through Dr. Carlean Purl      . DiphenhydrAMINE HCl, Sleep, 25 MG CAPS Take 50 mg by mouth at bedtime and may repeat dose one time if needed.       Marland Kitchen levonorgestrel (MIRENA) 20 MCG/24HR IUD 1 each by Intrauterine route once.       Current Facility-Administered Medications on File Prior to Visit  Medication Dose Route Frequency Provider Last Rate Last Dose  . pneumococcal 23 valent vaccine (PNU-IMMUNE) injection 0.5 mL  0.5 mL Intramuscular Once Gatha Mayer, MD       Allergies  Allergen Reactions  . Ibuprofen     Avoids due to Crohns disease  . Prednisone     "crazy", hallucinatory   ROS:  Denies fevers, URI symptoms, GI complaints (doing well on her current regimen).  Denies headaches, chest pain, shortness of breath. Denies joint pains, vaginal discharge, urinary complaints, rash, bleeding or other concerns.  See HPI.   PHYSICAL EXAM: BP 112/80  Pulse 68  Ht 5' 4"  (1.626 m)  Wt 176 lb (79.833 kg)  BMI 30.21 kg/m2 Well developed, pleasant overweight female in no distress Breast exam: no nipple discharge, nipple inversion, skin dimpling, breast mass or axillary lymphadenopathy.  There is some symmetric thickening of tissue at inferior breast--no focal mass, nontender Pelvic exam: normal external genitalia.  BUS/vagina normal.  Small amount of thin white discharge.  Cervix is normal, no lesions.  IUD strings visible.  No cervical motion tenderness.  Uterus and adnexa normal, no masses, nontender  Lab Results  Component Value Date   CHOL 111 10/03/2012   HDL 43 10/03/2012   LDLCALC 13 10/03/2012   TRIG 275* 10/03/2012   CHOLHDL 2.6 10/03/2012   ASSESSMENT/PLAN:  1. Routine gynecological  examination  Cytology - PAP  2. Abnormal Pap smear  Cytology - PAP  3. Hypertriglyceridemia    4. Cervical dysplasia s/p LEEP     Elevated TG; ?Validity of LDL Reviewed lowfat diet.  Recheck in 6 months to a year. Continue fish oil, recommend 12-3998 mg daily.  H/o abnormal pap--pap performed  Family h/o breast cancer.  Normal exam today (had her feel areas inferiorly to ensure that this is her baseline,  unchanged).  Recommend baseline mammogram at 46.  Monthly self breast exams recommended.  30 min visit, more than 1/2 spent counseling

## 2012-12-08 NOTE — Progress Notes (Signed)
Patient ID: Lindsay Mccarthy, female   DOB: 06-Nov-1975, 36 y.o.   MRN: [id number]   [employer] has approved her Cimzia until 12/08/2013.  Authorization number [id number]

## 2012-12-19 ENCOUNTER — Telehealth: Payer: Self-pay

## 2012-12-19 NOTE — Telephone Encounter (Signed)
Message copied by [place], Diasia Henken E on Tue Dec 19, 2012  5:06 PM ------      Message from: [place], Aliece Honold E      Created: Mon Jul 17, 2012 10:09 AM       Pt needs Hepatitis A , second vac., first one given 06/29/12 so second one has to be 59month and a day so say 01/01/13.       ------

## 2012-12-19 NOTE — Telephone Encounter (Signed)
Nurse appointment made for 2nd Hepatitis A vaccine to be given 01/01/13 at 4:15pm.

## 2013-01-04 ENCOUNTER — Ambulatory Visit (INDEPENDENT_AMBULATORY_CARE_PROVIDER_SITE_OTHER): Payer: 59 | Admitting: Internal Medicine

## 2013-01-04 DIAGNOSIS — Z23 Encounter for immunization: Secondary | ICD-10-CM

## 2013-01-18 ENCOUNTER — Telehealth: Payer: Self-pay | Admitting: Medical

## 2013-01-19 ENCOUNTER — Other Ambulatory Visit: Payer: Self-pay | Admitting: Medical

## 2013-01-19 MED ORDER — BUPROPION HCL ER (XL) 150 MG PO TB24: 150.0000 mg | ORAL_TABLET | Freq: Every day | ORAL | Status: DC

## 2013-01-19 NOTE — Telephone Encounter (Signed)
rx sent

## 2013-01-19 NOTE — Telephone Encounter (Signed)
I called out Wellbutrin to the patients pharmacy per Chana Bode PA-C. CLS

## 2013-02-01 ENCOUNTER — Other Ambulatory Visit (INDEPENDENT_AMBULATORY_CARE_PROVIDER_SITE_OTHER): Payer: 59

## 2013-02-01 ENCOUNTER — Encounter: Payer: Self-pay | Admitting: Family Medicine

## 2013-02-01 ENCOUNTER — Ambulatory Visit (INDEPENDENT_AMBULATORY_CARE_PROVIDER_SITE_OTHER): Payer: 59 | Admitting: Family Medicine

## 2013-02-01 VITALS — BP 112/70 | HR 64 | Ht 64.0 in | Wt 164.0 lb

## 2013-02-01 DIAGNOSIS — K5000 Crohn's disease of small intestine without complications: Secondary | ICD-10-CM

## 2013-02-01 DIAGNOSIS — F329 Major depressive disorder, single episode, unspecified: Secondary | ICD-10-CM

## 2013-02-01 DIAGNOSIS — F3289 Other specified depressive episodes: Secondary | ICD-10-CM

## 2013-02-01 LAB — CBC WITH DIFFERENTIAL/PLATELET
Basophils Absolute: 0 10*3/uL (ref 0.0–0.1)
Basophils Relative: 0.2 % (ref 0.0–3.0)
HCT: 39.5 % (ref 36.0–46.0)
Hemoglobin: 13.4 g/dL (ref 12.0–15.0)
Lymphs Abs: 1.2 10*3/uL (ref 0.7–4.0)
Monocytes Relative: 11.1 % (ref 3.0–12.0)
Neutro Abs: 4.8 10*3/uL (ref 1.4–7.7)
RBC: 3.95 Mil/uL (ref 3.87–5.11)
RDW: 14.1 % (ref 11.5–14.6)

## 2013-02-01 LAB — HEPATIC FUNCTION PANEL
AST: 20 U/L (ref 0–37)
Albumin: 3.6 g/dL (ref 3.5–5.2)
Alkaline Phosphatase: 57 U/L (ref 39–117)
Bilirubin, Direct: 0.1 mg/dL (ref 0.0–0.3)
Total Protein: 6.9 g/dL (ref 6.0–8.3)

## 2013-02-01 MED ORDER — BUPROPION HCL ER (SR) 100 MG PO TB12: 100.0000 mg | ORAL_TABLET | Freq: Two times a day (BID) | ORAL | Status: DC

## 2013-02-01 NOTE — Progress Notes (Signed)
Chief Complaint  Patient presents with  . discuss med    discuss wellbutrin, manufacturger changed and want to talk about  her options   Patient presents accompanied by her fiance.  Has been on Wellbutrin since she started Cimzia injections (around 3 years ago).  Had some crazy thoughts (envisioned herself smashing into parked car, irrational things--put house on market, sold couches on craigslist, crazy dreams).  Her GI doc sent her to sleep specialist, who said "if she wasn't crazy, it was because she was severely overweight" at a weight of 190.  Sleep study was normal.  She was started on Wellbutrin and things got better.  Subsequently learned of FDA warning re: Cimzia causing psych abnormalities (depression, bipolar, hallucinations).  Manufacturer of generic Wellbutrin has changed.  It has changed in past, and felt worse (crying, not feeling herself, more emotional), but improved when switching back to former manufacturer.  It switched again 2 months ago, got more emotional, then switched to a different manufacturer again, but hasn't improved.  The manufacturer that she did well with is no longer making med.    2.5 weeks ago got into fight with fiance over petty thing (he didn't wish her a happy birthday early enough in the day).  Has been more  irrational, moody.  Pharmacist mentioned fast acting rather than long-acting Wellbutrin, due to her crohn's.  She can see pill in her stool by noon (with these more recent substitution).  She changed to taking med in the evening 2 weeks ago, due to longer time before bowel movement, better absorption.  Fiance notes some improvement.  Taking 1/2 dose of nyquil at bedtime with it, previously was taking benadryl nightly (ran out).  Past Medical History  Diagnosis Date  . Asthma     Childhood  . Hyperlipemia   . Cervical dysplasia   . Crohn's disease of small intestine 06/24/2012    Diagnosed 1999, in [place]. Ileitis only then. Treated with Imuran  Remicade and prednisone.  Noncompliant with therapy. 2004 return to care and was treated with Remicade prednisone Pentasa Cipro and Flagyl. 2006 status post right hemicolectomy. Subsequently treated with azathioprine and Cimzia. 200 mg every other week.  azathioprine was added in 2010. Prometheus TP MT enzyme was negative. Has ha  . Osteopenia of femur neck T. score -1.4 09/29/2011  . Anemia     related to Crohns flares  . Seasonal allergic rhinitis   . History of recurrent UTI (urinary tract infection)   . Eczema     arms and behind knees, worse in winter  . Wears glasses   . Scaphoid fracture of wrist 2013    left  . Arthritis     knees, feet, hands, wrists, related to Crohns flare  . HPV (human papilloma virus) infection   . Papanicolaou smear 12/12    last abnormal 2011  . History of mammogram     never; recommended first screening age 36yo  . Depression    Past Surgical History  Procedure Laterality Date  . Leep  2011  . Colonoscopy  multiple    scanned  . Esophagogastroduodenoscopy  multiple    scanned  . Hemicolectomy  2006  . Appendectomy    . Wisdom tooth extraction     History   Social History  . Marital Status: Single    Spouse Name: N/A    Number of Children: N/A  . Years of Education: N/A   Occupational History  . Quality Analyst  Social History Main Topics  . Smoking status: Former Smoker -- 1.00 packs/day for 4 years    Quit date: 11/18/1998  . Smokeless tobacco: Never Used  . Alcohol Use: Yes     Comment: occasional  . Drug Use: No  . Sexually Active: Yes -- Female partner(s)    Birth Control/ Protection: IUD   Other Topics Concern  . Not on file   Social History Narrative   The patient is divorced.  Engaged to be married, lives with fiance, 3 cats.   No children - doesn't want any   [place].   Moved from [place] to [place] in 2013.   Past smoker   No alcohol   2 caffeinated beverages a day   She reports  she is compliant with sunscreen given her increased risk of son damage and skin cancer on azathioprine   Updated June 19, 2012   Exercise: 3-5 x per week at the gym   Current Outpatient Prescriptions on File Prior to Visit  Medication Sig Dispense Refill  . azaTHIOprine (IMURAN) 50 MG tablet TAKE 4 TABLETS BY MOUTH DAILY  360 tablet  1  . Calcium Carbonate-Vitamin D (CALCIUM 600+D) 600-400 MG-UNIT per tablet Take 1 tablet by mouth daily.      . Certolizumab Pegol (CIMZIA PREFILLED) 2 X 200 MG/ML KIT Inject 200 mg into the skin every 14 (fourteen) days.  1 kit  6  . Cyanocobalamin 1000 MCG/ML KIT Inject 1 application as directed. Monthly through Dr. Carlean Purl      . DiphenhydrAMINE HCl, Sleep, 25 MG CAPS Take 50 mg by mouth at bedtime and may repeat dose one time if needed.       Marland Kitchen levonorgestrel (MIRENA) 20 MCG/24HR IUD 1 each by Intrauterine route once.      . Omega-3 Fatty Acids (FISH OIL) 1000 MG CAPS Take 1 capsule by mouth daily.      . Probiotic Product (ALIGN PO) Take 1 capsule by mouth daily.       Current Facility-Administered Medications on File Prior to Visit  Medication Dose Route Frequency Provider Last Rate Last Dose  . pneumococcal 23 valent vaccine (PNU-IMMUNE) injection 0.5 mL  0.5 mL Intramuscular Once Gatha Mayer, MD       Allergies  Allergen Reactions  . Ibuprofen     Avoids due to Crohns disease  . Prednisone     "crazy", hallucinatory   ROS:  Denies weight changes, headaches, dizziness, chest pain, palpitations, GI complaints--crohn's is in remission.  No infection, URI symptoms, cough, shortness of breath, fever, rashes/lesions.  PHYSICAL EXAM: BP 112/70  Pulse 64  Ht 5' 4"  (1.626 m)  Wt 164 lb (74.39 kg)  BMI 28.14 kg/m2 Well developed, pleasant female in no distress. Normal mood, affect, hygiene and grooming.  Normal speech, eye contact  ASSESSMENT/PLAN: Depressive disorder, not elsewhere classified - Plan: buPROPion (WELLBUTRIN SR) 100 MG 12 hr  tablet  Depression, hallucinations, irritability all related to start of Cimzia, likely side effect.  Has done well on wellbutrin, only recently not doing as well since manufacturer of generic med changed, and isn't absorbing med as well, seeing pill in her stool.  Discussed SR 100 BID vs TID dosing of regular wellbutrin, vs 150 XL at night, potentially increasing to 300XL at night, aware of potential increase in side effects (weird dreams, insomnia).  Chooses to start with 166m BID of the SR  Expect 4-6 weeks before full effect, but hope to see  some improvement sooner . If tolerating without side effects, but not as effective as desired, call to have increased to 189m twice daily.  We did discuss other possible treatments, including SSRI's, vs SNRI's--she prefers to stay on Wellbutrin given efficacy she has had so far.  30 minute visit, all counseling.

## 2013-02-01 NOTE — Patient Instructions (Addendum)
If this medication isn't effective, but otherwise tolerating, call to have dose increased to the 169m twice daily. Consider changing class of medication entirely if unable to adequately absorb the pill, versus changing to the plain wellbutrin (three times daily)

## 2013-02-04 NOTE — Progress Notes (Signed)
Quick Note:  Labs are ok Need to repeat same in 4 months and she should schedule a follow-up with me for June  ______

## 2013-02-05 ENCOUNTER — Other Ambulatory Visit: Payer: Self-pay

## 2013-02-05 DIAGNOSIS — K5000 Crohn's disease of small intestine without complications: Secondary | ICD-10-CM

## 2013-02-05 MED ORDER — AZATHIOPRINE 50 MG PO TABS: ORAL_TABLET | ORAL | Status: DC

## 2013-02-08 NOTE — Progress Notes (Signed)
For HAV vaccination

## 2013-02-09 NOTE — Progress Notes (Signed)
Quick Note:  She is a little above usual maximal dose in mg/kg so she can reduce dose by 25 mg or if too hard to cut tablet reduce by 50 mg ______

## 2013-03-19 ENCOUNTER — Encounter: Payer: Self-pay | Admitting: Internal Medicine

## 2013-04-05 ENCOUNTER — Encounter: Payer: Self-pay | Admitting: Family Medicine

## 2013-04-18 ENCOUNTER — Ambulatory Visit (INDEPENDENT_AMBULATORY_CARE_PROVIDER_SITE_OTHER): Payer: 59 | Admitting: Internal Medicine

## 2013-04-18 ENCOUNTER — Encounter: Payer: Self-pay | Admitting: Internal Medicine

## 2013-04-18 VITALS — BP 98/60 | HR 80 | Ht 64.0 in | Wt 164.2 lb

## 2013-04-18 DIAGNOSIS — K5000 Crohn's disease of small intestine without complications: Secondary | ICD-10-CM

## 2013-04-18 DIAGNOSIS — E538 Deficiency of other specified B group vitamins: Secondary | ICD-10-CM

## 2013-04-18 DIAGNOSIS — M899 Disorder of bone, unspecified: Secondary | ICD-10-CM

## 2013-04-18 DIAGNOSIS — M8580 Other specified disorders of bone density and structure, unspecified site: Secondary | ICD-10-CM

## 2013-04-18 DIAGNOSIS — M949 Disorder of cartilage, unspecified: Secondary | ICD-10-CM

## 2013-04-18 DIAGNOSIS — Z79899 Other long term (current) drug therapy: Secondary | ICD-10-CM

## 2013-04-18 DIAGNOSIS — Z7960 Long term (current) use of unspecified immunomodulators and immunosuppressants: Secondary | ICD-10-CM

## 2013-04-18 MED ORDER — CYANOCOBALAMIN 1000 MCG/ML IJ KIT: 1.0000 "application " | PACK | INTRAMUSCULAR | Status: DC

## 2013-04-18 NOTE — Assessment & Plan Note (Signed)
Feels much better on supplements Refill and continue monthly B12

## 2013-04-18 NOTE — Progress Notes (Signed)
  Subjective:    Patient ID: Lindsay Mccarthy, female    DOB: 05-Oct-1976, 37 y.o.   MRN: [id number]  HPI Patient returns, I met her for the first time last year with a history of Crohn's disease of the ileum, status post right hemicolectomy, on Cimzia and azathioprine. She has done well in the past year. I diagnosed her with vitamin B12 deficiency and she said supplementation with as many U. difference in her life leading to greater energy level and overall well being. She is not having any diarrhea, perianal or perirectal symptoms, or bleeding or significant abdominal pain. She is interested in tapering off azathioprine. She says that when she started that it was a couple of months in to [place] treatment in 2010, while she was ill, having been off therapy for a number of years. Medications, allergies, past medical history, past surgical history, family history and social history are reviewed and updated in the EMR.   Review of Systems As above    Objective:   Physical Exam General:  NAD Eyes:   anicteric Lungs:  clear Heart:  S1S2 no rubs, murmurs or gallops Abdomen:  soft and nontender, BS+, well-healed surgical scars Ext:   no edema   Data Reviewed:  Pap smear, primary care notes, recent lab    Assessment & Plan:   1. Crohn's disease of small intestine, without complications   2. Osteopenia of femur neck T. score -1.4   3. Vitamin B12 deficiency   4. Long-term use of immunosuppressant medication-Azathioprine and Cimzia    Doing well overall, see problem oriented charting, return routinely in 1 year.

## 2013-04-18 NOTE — Patient Instructions (Addendum)
We have sent the following medications to your pharmacy for you to pick up at your convenience: Vitamin B-12  We will send you a reminder to get your Dexa scan in November 2014.  Orders are in epic.  You need to get a PPD test, when we get this in I will call you to set up.  Taper off your Imuran as we discussed today.  Follow up with [place] in a year.   I appreciate the opportunity to care for you.

## 2013-04-18 NOTE — Assessment & Plan Note (Signed)
Vaccines up to date PAP ok 09/2012

## 2013-04-18 NOTE — Assessment & Plan Note (Signed)
Will need repeat DEXA 09/2013

## 2013-04-18 NOTE — Assessment & Plan Note (Signed)
Doing very well Will try to taper azathioprine

## 2013-04-19 ENCOUNTER — Telehealth: Payer: Self-pay

## 2013-04-19 NOTE — Telephone Encounter (Signed)
We got our PPD in, left message for patient to call and set up Nurse appointment to get this done.  Order needs to be put in when patient comes per Barb Merino, RN , epic won't let you do a future order on it.

## 2013-04-26 ENCOUNTER — Encounter: Payer: Self-pay | Admitting: Internal Medicine

## 2013-04-30 ENCOUNTER — Ambulatory Visit (INDEPENDENT_AMBULATORY_CARE_PROVIDER_SITE_OTHER): Payer: 59 | Admitting: Internal Medicine

## 2013-04-30 DIAGNOSIS — K5010 Crohn's disease of large intestine without complications: Secondary | ICD-10-CM

## 2013-06-07 ENCOUNTER — Telehealth: Payer: Self-pay | Admitting: *Deleted

## 2013-06-07 NOTE — Telephone Encounter (Signed)
Message copied by Hulan Saas on Thu Jun 07, 2013  3:44 PM ------      Message from: Marlon Pel      Created: Mon Feb 05, 2013  8:43 AM       Needs labs.  They are in  ------

## 2013-06-07 NOTE — Telephone Encounter (Signed)
Unable to reach patient. No voice mail will try again later.

## 2013-06-08 NOTE — Telephone Encounter (Signed)
Unable to reach patient. Unable to leave a message.

## 2013-06-11 ENCOUNTER — Encounter: Payer: Self-pay | Admitting: *Deleted

## 2013-06-11 NOTE — Telephone Encounter (Signed)
Letter mailed to patient.

## 2013-06-11 NOTE — Telephone Encounter (Signed)
Unable to reach patient or leave a voice mail.

## 2013-06-17 ENCOUNTER — Encounter: Payer: Self-pay | Admitting: Internal Medicine

## 2013-06-19 ENCOUNTER — Other Ambulatory Visit (INDEPENDENT_AMBULATORY_CARE_PROVIDER_SITE_OTHER): Payer: 59

## 2013-06-19 DIAGNOSIS — K5000 Crohn's disease of small intestine without complications: Secondary | ICD-10-CM

## 2013-06-19 LAB — CBC WITH DIFFERENTIAL/PLATELET
Basophils Relative: 0.4 % (ref 0.0–3.0)
Eosinophils Absolute: 0.1 10*3/uL (ref 0.0–0.7)
Eosinophils Relative: 0.8 % (ref 0.0–5.0)
Lymphocytes Relative: 16.4 % (ref 12.0–46.0)
MCHC: 34 g/dL (ref 30.0–36.0)
Monocytes Relative: 10.4 % (ref 3.0–12.0)
Neutrophils Relative %: 72 % (ref 43.0–77.0)

## 2013-06-19 LAB — HEPATIC FUNCTION PANEL
AST: 18 U/L (ref 0–37)
Alkaline Phosphatase: 52 U/L (ref 39–117)
Bilirubin, Direct: 0.1 mg/dL (ref 0.0–0.3)
Total Protein: 6.8 g/dL (ref 6.0–8.3)

## 2013-06-20 NOTE — Progress Notes (Signed)
Quick Note:  Labs are normal or unchanged and acceptable. Repeat CBC and hepatic function panel in 4 months as usual. Please notify patient. ______

## 2013-06-21 ENCOUNTER — Other Ambulatory Visit: Payer: Self-pay

## 2013-06-21 DIAGNOSIS — K5000 Crohn's disease of small intestine without complications: Secondary | ICD-10-CM

## 2013-07-27 ENCOUNTER — Other Ambulatory Visit: Payer: Self-pay | Admitting: Family Medicine

## 2013-08-20 ENCOUNTER — Telehealth: Payer: Self-pay | Admitting: *Deleted

## 2013-08-20 NOTE — Telephone Encounter (Signed)
I have advised patient of time, date and location of DEXA. She verbalizes understanding and states that this date should work fine.

## 2013-08-20 NOTE — Telephone Encounter (Signed)
Patient called our back line to advise that she is confused about her prep for colonoscopy on 08/22/13. She states that she has lactulose but doesn't understand how to use it. I have advised that she should have Moviprep for the colonoscopy and lactulose for constipation. She states that she never got Moviprep. Patient then states that she actually thinks she used that about 2 weeks ago. I have advised her to come to the office today for a Moviprep kit and repeat instructions regarding her prep and how to take her lactulose. She verbalizes understanding and states that she is on her way.

## 2013-08-20 NOTE — Telephone Encounter (Signed)
Patient has been scheduled for a DEXA on Monday, 09/10/13 @ 9:00 am. I have left a message for patient to call back.

## 2013-08-20 NOTE — Telephone Encounter (Signed)
Message copied by Larina Bras on Mon Aug 20, 2013 10:02 AM ------      Message from: [place], PATTI E      Created: Wed Apr 18, 2013  4:45 PM       Call patient and set up DEXA for November, order put in epic at todays visit (04/18/13). ------

## 2013-08-30 ENCOUNTER — Telehealth: Payer: Self-pay | Admitting: Internal Medicine

## 2013-08-30 MED ORDER — AZATHIOPRINE 50 MG PO TABS: 50.0000 mg | ORAL_TABLET | Freq: Every day | ORAL | Status: DC

## 2013-08-30 NOTE — Telephone Encounter (Signed)
Please advise Sir, thank you. 

## 2013-08-30 NOTE — Telephone Encounter (Signed)
Ok to refill for 6 months at same dose She should have labs done in December (Future orders in place)

## 2013-08-30 NOTE — Telephone Encounter (Signed)
Left message on patient's voice mail that refill sent in as requested and reminded her to come for labs in December 2014, orders in.

## 2013-09-03 ENCOUNTER — Other Ambulatory Visit: Payer: Self-pay | Admitting: Family Medicine

## 2013-09-10 ENCOUNTER — Ambulatory Visit (INDEPENDENT_AMBULATORY_CARE_PROVIDER_SITE_OTHER)
Admission: RE | Admit: 2013-09-10 | Discharge: 2013-09-10 | Disposition: A | Discharge status: Home / Self Care | Payer: 59 | Source: Ambulatory Visit | Attending: Internal Medicine | Admitting: Internal Medicine

## 2013-09-10 DIAGNOSIS — M8580 Other specified disorders of bone density and structure, unspecified site: Secondary | ICD-10-CM

## 2013-09-10 DIAGNOSIS — M899 Disorder of bone, unspecified: Secondary | ICD-10-CM

## 2013-09-10 LAB — HM DEXA SCAN

## 2013-09-18 ENCOUNTER — Ambulatory Visit (INDEPENDENT_AMBULATORY_CARE_PROVIDER_SITE_OTHER): Payer: 59 | Admitting: Internal Medicine

## 2013-09-18 ENCOUNTER — Encounter: Payer: Self-pay | Admitting: Internal Medicine

## 2013-09-18 ENCOUNTER — Other Ambulatory Visit (INDEPENDENT_AMBULATORY_CARE_PROVIDER_SITE_OTHER): Payer: 59

## 2013-09-18 VITALS — BP 126/80 | HR 64 | Ht 64.0 in | Wt 164.4 lb

## 2013-09-18 DIAGNOSIS — E538 Deficiency of other specified B group vitamins: Secondary | ICD-10-CM

## 2013-09-18 DIAGNOSIS — Z79899 Other long term (current) drug therapy: Secondary | ICD-10-CM

## 2013-09-18 DIAGNOSIS — M2550 Pain in unspecified joint: Secondary | ICD-10-CM

## 2013-09-18 DIAGNOSIS — Z23 Encounter for immunization: Secondary | ICD-10-CM

## 2013-09-18 DIAGNOSIS — Z7960 Long term (current) use of unspecified immunomodulators and immunosuppressants: Secondary | ICD-10-CM

## 2013-09-18 DIAGNOSIS — K5000 Crohn's disease of small intestine without complications: Secondary | ICD-10-CM

## 2013-09-18 LAB — CBC WITH DIFFERENTIAL/PLATELET
Basophils Relative: 0.3 % (ref 0.0–3.0)
Eosinophils Relative: 0.8 % (ref 0.0–5.0)
HCT: 39.4 % (ref 36.0–46.0)
Hemoglobin: 13.4 g/dL (ref 12.0–15.0)
Lymphs Abs: 1.2 10*3/uL (ref 0.7–4.0)
MCV: 96.3 fl (ref 78.0–100.0)
Monocytes Absolute: 0.8 10*3/uL (ref 0.1–1.0)
Neutro Abs: 5 10*3/uL (ref 1.4–7.7)
Neutrophils Relative %: 70.6 % (ref 43.0–77.0)
RBC: 4.09 Mil/uL (ref 3.87–5.11)
WBC: 7 10*3/uL (ref 4.5–10.5)

## 2013-09-18 LAB — C-REACTIVE PROTEIN: CRP: <0.5 mg/dL (ref 0.5–20.0)

## 2013-09-18 LAB — HEPATIC FUNCTION PANEL
ALT: 15 U/L (ref 0–35)
Alkaline Phosphatase: 62 U/L (ref 39–117)
Bilirubin, Direct: 0.1 mg/dL (ref 0.0–0.3)
Total Protein: 7.4 g/dL (ref 6.0–8.3)

## 2013-09-18 LAB — COMPREHENSIVE METABOLIC PANEL
AST: 19 U/L (ref 0–37)
Albumin: 3.6 g/dL (ref 3.5–5.2)
Alkaline Phosphatase: 62 U/L (ref 39–117)
BUN: 9 mg/dL (ref 6–23)
Creatinine, Ser: 0.7 mg/dL (ref 0.4–1.2)
Potassium: 3.8 mEq/L (ref 3.5–5.1)
Sodium: 137 mEq/L (ref 135–145)
Total Protein: 7.4 g/dL (ref 6.0–8.3)

## 2013-09-18 MED ORDER — AZATHIOPRINE 50 MG PO TABS: 150.0000 mg | ORAL_TABLET | Freq: Every day | ORAL | Status: DC

## 2013-09-18 MED ORDER — DIPHENOXYLATE-ATROPINE 2.5-0.025 MG PO TABS: 1.0000 | ORAL_TABLET | Freq: Four times a day (QID) | ORAL | Status: DC | PRN

## 2013-09-18 NOTE — Assessment & Plan Note (Signed)
Influenza vaccine today Check BC, LFT's

## 2013-09-18 NOTE — Progress Notes (Signed)
  Subjective:    Patient ID: Lindsay Mccarthy, female    DOB: 1975-12-20, 37 y.o.   MRN: [id number]  HPI 4-10 loose stools, urgent daily Hair shedding Nausea and abdominal cramps Similar to other flares  We had tried reducing AZA and when she went from 100 to 50 mg daily sxs started  Align helps some Had PCN last week for dental extraction but no other Abx or travel Headed to [place] at [place]  Medications, allergies, past medical history, past surgical history, family history and social history are reviewed and updated in the EMR.  Review of Systems + arthralgias    Objective:   Physical Exam General:  NAD Eyes:   anicteric Lungs:  clear Heart:  S1S2 no rubs, murmurs or gallops Abdomen:  soft and nontender, BS+ Ext:   no edema    Assessment & Plan:  Crohn's disease of small intestine - Plan: azaTHIOprine (IMURAN) 50 MG tablet, diphenoxylate-atropine (LOMOTIL) 2.5-0.025 MG per tablet, CBC with Differential, Fecal lactoferrin, Comp Met (CMET), C-reactive protein  Arthralgia - Plan: CBC with Differential, Fecal lactoferrin, Comp Met (CMET), C-reactive protein  Long-term use of immunosuppressant medication-Azathioprine and Cimzia - Plan: CBC with Differential, Fecal lactoferrin, Comp Met (CMET), C-reactive protein  Need for prophylactic vaccination and inoculation against influenza - Plan: Flu Vaccine QUAD 36+ mos IM

## 2013-09-18 NOTE — Assessment & Plan Note (Addendum)
Flaring? Go back to 150 mg AZA C diff PCR, fecal wbc, CBC, CMET, CRP Return 12/15 week Lomotil prn ? Abx ? Change agents ? Endoscopic evaluation  Depends upon response

## 2013-09-18 NOTE — Patient Instructions (Addendum)
Your physician has requested that you go to the basement for rlab work before leaving today.  Today you have been given a flu vaccine.  We would like to see you back Dec. 17th at 10:00am.  Today we are giving you a rx for Lomotil printed out to take to your pharmacy.  Take 15m of your Imuran daily.   I appreciate the opportunity to care for you.

## 2013-09-19 NOTE — Assessment & Plan Note (Signed)
Continue supplementation  ?

## 2013-09-20 NOTE — Progress Notes (Signed)
Quick Note:  These labs all look normal. Hope you improve quickly and I will let you know when other test results are in. Still do not have bone density results but will check ______

## 2013-10-01 ENCOUNTER — Other Ambulatory Visit: Payer: Self-pay | Admitting: Internal Medicine

## 2013-10-01 ENCOUNTER — Encounter: Payer: Self-pay | Admitting: Internal Medicine

## 2013-10-01 DIAGNOSIS — K5000 Crohn's disease of small intestine without complications: Secondary | ICD-10-CM

## 2013-10-01 MED ORDER — MERCAPTOPURINE 50 MG PO TABS: 1.5000 mg/kg | ORAL_TABLET | Freq: Every day | ORAL | Status: DC

## 2013-10-01 NOTE — Progress Notes (Signed)
Quick Note:  T - 1.6 = osteopenia not osteoporosis ______

## 2013-10-03 ENCOUNTER — Encounter: Payer: Self-pay | Admitting: Family Medicine

## 2013-10-03 ENCOUNTER — Ambulatory Visit (INDEPENDENT_AMBULATORY_CARE_PROVIDER_SITE_OTHER): Payer: 59 | Admitting: Family Medicine

## 2013-10-03 ENCOUNTER — Other Ambulatory Visit: Payer: 59

## 2013-10-03 ENCOUNTER — Other Ambulatory Visit (HOSPITAL_COMMUNITY)
Admission: RE | Admit: 2013-10-03 | Discharge: 2013-10-03 | Disposition: A | Discharge status: Home / Self Care | Payer: 59 | Source: Ambulatory Visit | Attending: Family Medicine | Admitting: Family Medicine

## 2013-10-03 VITALS — BP 112/60 | HR 72 | Ht 64.0 in | Wt 162.0 lb

## 2013-10-03 DIAGNOSIS — F325 Major depressive disorder, single episode, in full remission: Secondary | ICD-10-CM | POA: Insufficient documentation

## 2013-10-03 DIAGNOSIS — K5000 Crohn's disease of small intestine without complications: Secondary | ICD-10-CM

## 2013-10-03 DIAGNOSIS — IMO0002 Reserved for concepts with insufficient information to code with codable children: Secondary | ICD-10-CM

## 2013-10-03 DIAGNOSIS — M2550 Pain in unspecified joint: Secondary | ICD-10-CM

## 2013-10-03 DIAGNOSIS — E781 Pure hyperglyceridemia: Secondary | ICD-10-CM

## 2013-10-03 DIAGNOSIS — Z01419 Encounter for gynecological examination (general) (routine) without abnormal findings: Secondary | ICD-10-CM | POA: Insufficient documentation

## 2013-10-03 DIAGNOSIS — Z1151 Encounter for screening for human papillomavirus (HPV): Secondary | ICD-10-CM | POA: Insufficient documentation

## 2013-10-03 DIAGNOSIS — Z0000 Encounter for general adult medical examination without abnormal findings: Secondary | ICD-10-CM

## 2013-10-03 DIAGNOSIS — R269 Unspecified abnormalities of gait and mobility: Secondary | ICD-10-CM

## 2013-10-03 DIAGNOSIS — Z7960 Long term (current) use of unspecified immunomodulators and immunosuppressants: Secondary | ICD-10-CM

## 2013-10-03 DIAGNOSIS — M8580 Other specified disorders of bone density and structure, unspecified site: Secondary | ICD-10-CM

## 2013-10-03 DIAGNOSIS — Z79899 Other long term (current) drug therapy: Secondary | ICD-10-CM

## 2013-10-03 DIAGNOSIS — M899 Disorder of bone, unspecified: Secondary | ICD-10-CM

## 2013-10-03 HISTORY — DX: Major depressive disorder, single episode, in full remission: F32.5

## 2013-10-03 LAB — POCT URINALYSIS DIPSTICK
Bilirubin, UA: NEGATIVE
Glucose, UA: NEGATIVE
Protein, UA: NEGATIVE
Spec Grav, UA: <=1.005
Urobilinogen, UA: NEGATIVE

## 2013-10-03 MED ORDER — BUPROPION HCL ER (SR) 100 MG PO TB12: ORAL_TABLET | ORAL | Status: DC

## 2013-10-03 NOTE — Patient Instructions (Signed)
  HEALTH MAINTENANCE RECOMMENDATIONS:  It is recommended that you get at least 30 minutes of aerobic exercise at least 5 days/week (for weight loss, you may need as much as 60-90 minutes). This can be any activity that gets your heart rate up. This can be divided in 10-15 minute intervals if needed, but try and build up your endurance at least once a week.  Weight bearing exercise is also recommended twice weekly.  Eat a healthy diet with lots of vegetables, fruits and fiber.  "Colorful" foods have a lot of vitamins (ie green vegetables, tomatoes, red peppers, etc).  Limit sweet tea, regular sodas and alcoholic beverages, all of which has a lot of calories and sugar.  Up to 1 alcoholic drink daily may be beneficial for women (unless trying to lose weight, watch sugars).  Drink a lot of water.  Calcium recommendations are 1200-1500 mg daily (1500 mg for postmenopausal women or women without ovaries), and vitamin D 1000 IU daily.  This should be obtained from diet and/or supplements (vitamins), and calcium should not be taken all at once, but in divided doses.  Monthly self breast exams and yearly mammograms for women over the age of 27 is recommended.  Sunscreen of at least SPF 30 should be used on all sun-exposed parts of the skin when outside between the hours of 10 am and 4 pm (not just when at beach or pool, but even with exercise, golf, tennis, and yard work!)  Use a sunscreen that says "broad spectrum" so it covers both UVA and UVB rays, and make sure to reapply every 1-2 hours.  Remember to change the batteries in your smoke detectors when changing your clock times in the spring and fall.  Use your seat belt every time you are in a car, and please drive safely and not be distracted with cell phones and texting while driving.   You will be due to have your bone density rechecked in 2 years. Try and add weight-bearing exercise to your routine.

## 2013-10-03 NOTE — Progress Notes (Signed)
Chief Complaint  Patient presents with  . Annual Exam    nonfasting annual exam with pap. Did not do eye exam as she just had one.UA showed 1+ leuks and trace blood, patient is asymptomatic. Would like to discuss her osteopenia with you. She had a pinched hip in her neck and has "drop foot" and has fallen 3 x this year and would like to possibly see a specialist.    Lindsay Mccarthy is a 37 y.o. female who presents for a complete physical.  She has the following concerns:  She wants to discuss her last DEXA, osteopenia, concerned that it is worsening.  These were done at two different locations [facility] and here), so cannot directly compare results. She is not currently getting any weightbearing exercise (just walks on treadmill, has a slight resistance band she uses while on treadmill).  She had a h/o pinched nerve in her back on her left side.  She thinks she has some foot drop, mainly due to the fact that she has the tips of her shoes wearing out faster than the rest of the shoe, and due to her having had some falls. She denies tripping over edge of rugs or other objects as cause of falls, just "clumsy". She has tripped and fallen 3 times this year, especially if she isn't really focusing on picking her feet up right.  She denies any back pain, numbness, tingling.  She was seeing chiropractor for this--about 5 years ago, when she had numbness in the leg, never really had any pain.  She had PT but didn't find that it helped all that much.  She states that chiro said that her left hip was out of joint, and was pinching the nerve, and was treating her for that.  She feels that her strength has improved, but she still is wearing out the toes of her shoes and falling. She is wondering if she needs an orthotic/brace or other evaluation or referral for this.  She previously had abnormal paps, HPV. Treated with LEEP in 2011. Had 2 normal paps in 2012,  and normal pap here 10/2012. Has Mirena IUD--gets some cramping  and mood change,s but no spotting/bleeding. She has never been pregnant, and doesn't want children. She has been in a monogamous relationship x 7 years without concern for STD. Denies vaginal discharge, odor, itch.   Depression follow-up: changed to SR wellbutrin 175m BID after seeing Wellbutrin XL 1516min her stool.  Doesn't seem to work quite as well as the original 1507mL that was adequately absorbed (not seen in stool), but overall moods are okay.  Perhaps some mild seasonal change, and related to stress--overall doing okay.  Immunization History  Administered Date(s) Administered  . Hepatitis A 06/29/2012, 01/04/2013  . Influenza Split 08/09/2012  . Influenza,inj,Quad PF,36+ Mos 09/18/2013  . PPD Test 04/30/2013  . Pneumococcal Polysaccharide-23 07/06/2012   Last Pap smear: 10/2012, normal Last mammogram: 03/2013 Last colonoscopy: 03/2009 Last DEXA: 09/2013 Ophtho: yearly Dentist: yearly (had a lot of dental work this year). Exercise:  Treadmill three days/week  She brings in copies of labs done at work in 05/2013: glucose 84, Tchol 143; HDL 58, LDL 49, chol/HDL ratio 2.5, TG 180  Past Medical History  Diagnosis Date  . Asthma     Childhood  . Hyperlipemia   . Cervical dysplasia   . Crohn's disease of small intestine 06/24/2012    [place] Tre[place] with Imuran Remicade and prednisone.  Noncompliant with therapy. 2004 return to care and was treated with Remicade prednisone Pentasa Cipro and Flagyl. 2006 status post right hemicolectomy. Subsequently treated with azathioprine and Cimzia. 200 mg every other week.  azathioprine was added in 2010. Prometheus TP MT enzyme was negative. Has ha  . Osteopenia of femur neck T. score -1.4 09/29/2011    09/2013 T-1.6  . Anemia     related to Crohns flares  . Seasonal allergic rhinitis   . History of recurrent UTI (urinary tract infection)   . Eczema     arms and behind knees, worse in winter  .  Wears glasses   . Scaphoid fracture of wrist 2013    left  . Arthritis     knees, feet, hands, wrists, related to Crohns flare  . HPV (human papilloma virus) infection   . Papanicolaou smear 12/12    last abnormal 2011  . Depression   . B12 deficiency     monitored/treated by Dr. Carlean Purl    Past Surgical History  Procedure Laterality Date  . Leep  2011  . Colonoscopy  multiple    scanned  . Esophagogastroduodenoscopy  multiple    scanned  . Hemicolectomy  2006  . Appendectomy    . Wisdom tooth extraction      History   Social History  . Marital Status: Single    Spouse Name: N/A    Number of Children: N/A  . Years of Education: N/A   Occupational History  . Quality Analyst    Social History Main Topics  . Smoking status: Former Smoker -- 1.00 packs/day for 4 years    Quit date: 11/18/1998  . Smokeless tobacco: Never Used  . Alcohol Use: Yes     Comment: rarely  . Drug Use: No  . Sexual Activity: Yes    Partners: Male    Birth Control/ Protection: IUD   Other Topics Concern  . Not on file   Social History Narrative   The patient is divorced.  Engaged to be married, lives with fiance, 3 cats.   No children - doesn't want any   [place].   Moved from [place] to [place] in 2013.   Past smoker   No alcohol   2 caffeinated beverages a day   She reports she is compliant with sunscreen given her increased risk of sun damage and skin cancer on azathioprine   Exercise: 3x per week--treadmill, walks on breaks at work   Updated 10/03/2013    Family History  Problem Relation Age of Onset  . Breast cancer Mother 65  . Hypertension Mother   . Cancer Mother     breast  . Colon polyps Father   . Diabetes Father     borderline  . Hypertension Father   . Colon cancer Paternal Grandfather   . Cancer Paternal Grandfather     colon  . Multiple sclerosis Sister   . Stroke Maternal Grandmother   . Cancer Maternal Grandmother      lung  . Heart disease Paternal Grandmother     Current outpatient prescriptions:azaTHIOprine (IMURAN) 50 MG tablet, Take 150 mg by mouth daily., Disp: , Rfl: ;  buPROPion (WELLBUTRIN SR) 100 MG 12 hr tablet, TAKE 1 TABLET BY MOUTH 2 TIMES DAILY., Disp: 60 tablet, Rfl: 11;  Certolizumab Pegol (CIMZIA PREFILLED) 2 X 200 MG/ML KIT, Inject 200 mg into the skin every 14 (fourteen) days., Disp: 1 kit, Rfl: 6;  Cetirizine HCl (ZYRTEC PO),  Take by mouth., Disp: , Rfl:  Cyanocobalamin 1000 MCG/ML KIT, Inject 1 application as directed every 30 (thirty) days. Monthly through Dr. Carlean Purl, Disp: 1 kit, Rfl: 11;  diphenhydrAMINE (SOMINEX) 25 MG tablet, Take 25 mg by mouth at bedtime as needed for sleep., Disp: , Rfl: ;  levonorgestrel (MIRENA) 20 MCG/24HR IUD, 1 each by Intrauterine route once., Disp: , Rfl: ;  Probiotic Product (ALIGN PO), Take 1 capsule by mouth daily., Disp: , Rfl:  diphenoxylate-atropine (LOMOTIL) 2.5-0.025 MG per tablet, Take 1 tablet by mouth 4 (four) times daily as needed for diarrhea or loose stools., Disp: 120 tablet, Rfl: 0;  mercaptopurine (PURINETHOL) 50 MG tablet, Take 2 tablets (100 mg total) by mouth daily. Give on an empty stomach 1 hour before or 2 hours after meals. Caution: Chemotherapy., Disp: 60 tablet, Rfl: 2  Allergies  Allergen Reactions  . Ibuprofen     Avoids due to Crohns disease  . Prednisone     "crazy", hallucinatory   ROS:  The patient denies anorexia, fever, weight changes, headaches,  vision changes, decreased hearing, ear pain, sore throat, breast concerns, chest pain, palpitations, dizziness, syncope, dyspnea on exertion, cough, swelling, nausea, vomiting, indigestion/heartburn, hematuria, incontinence, dysuria, vaginal bleeding, discharge, odor or itch, genital lesions, numbness, tingling, tremor, suspicious skin lesions, anxiety, abnormal bleeding/bruising, or enlarged lymph nodes. No weight changes in the last 8 months, but down 10 pounds from this time last  year Some abdominal pain and diarrhea related to her Crohn's--no significant changes in bowels.  Not seeing any blood in stool. Some seasonal allergies, controlled by OTC meds. Some pain in knees, hips, hands and wrists, mild.  PHYSICAL EXAM: BP 112/60  Pulse 72  Ht 5' 4"  (1.626 m)  Wt 162 lb (73.483 kg)  BMI 27.79 kg/m2  General Appearance:    Alert, cooperative, no distress, appears stated age  Head:    Normocephalic, without obvious abnormality, atraumatic  Eyes:    PERRL, conjunctiva/corneas clear, EOM's intact, fundi    benign  Ears:    Normal TM's and external ear canals  Nose:   Nares normal, mucosa normal, no drainage or sinus   tenderness  Throat:   Lips, mucosa, and tongue normal; teeth and gums normal  Neck:   Supple, no lymphadenopathy;  thyroid:  no   enlargement/tenderness/nodules; no carotid   bruit or JVD  Back:    Spine nontender, no curvature, ROM normal, no CVA     Tenderness.  Very mild tenderness at L SI joint.  No muscle spasm  Lungs:     Clear to auscultation bilaterally without wheezes, rales or     ronchi; respirations unlabored  Chest Wall:    No tenderness or deformity   Heart:    Regular rate and rhythm, S1 and S2 normal, no murmur, rub   or gallop  Breast Exam:    No tenderness, masses, or nipple discharge or inversion.      No axillary lymphadenopathy  Abdomen:     Soft, non-tender, nondistended, normoactive bowel sounds,    no masses, no hepatosplenomegaly  Genitalia:    Normal external genitalia without lesions.  BUS and vagina normal; cervix without lesions; IUD strings visible;  no cervical motion tenderness. No abnormal vaginal discharge.  Uterus and adnexa not enlarged, nontender, no masses.  Pap performed  Rectal:    Not performed due to age<40 and being under care of GI doctor  Extremities:   No clubbing, cyanosis or edema  Pulses:  2+ and symmetric all extremities  Skin:   Skin color, texture, turgor normal, no rashes or lesions  Lymph nodes:    Cervical, supraclavicular, and axillary nodes normal  Neurologic:   CNII-XII intact, normal strength, sensation and gait; reflexes 2+ and symmetric throughout.  Normal heel and toe walking, without any evidence of weakness.           Psych:   Normal mood, affect, hygiene and grooming.    ASSESSMENT/PLAN:  Routine general medical examination at a health care facility - Plan: POCT Urinalysis Dipstick, Cytology - PAP [place]  Osteopenia of femur neck T. score -1.4 - most recent T was -1.6. reassured that treatment isn't needed at this time, need to focus on prevention (calcium, vitamin D, weight-bearing exercise). repeat 2y  Hypertriglyceridemia - improved/borderline.  reviewed lowfat diet.  LDL is excellent  Depression, major, in remission - Plan: buPROPion (WELLBUTRIN SR) 100 MG 12 hr tablet  Abnormality of gait - abnormal wear of shoes at toes; no evidence of any significant weakness on exam   Depression--adequately controlled, although not as good as in past.  Part may be due to med change, but more likely also has contribution of SAD (stress, time of year, etc).  She elects to continue current medication (offered increase in dose to 142m BID; pt declines for now).  DEXA--full report not visible in computer, no FRAX score.  Discussed in detail the risk factors for osteoporosis, osteoporosis vs osteopenia.  Her risks include steroid use, former smoking history, lack of regular weight-bearing exercise.  Recommended repeating DEXA in 2 years, calcium plus D recommendations, weight-bearing exercise.  Complaints of falls and possible foot drop.  Advised that her neuro exam and strength is normal--not indicated to wear brace/orthotic at this time.  She has not gotten benefit from PT in the past, and no significant weakness noted.  Per history, sounds like she has had some SI joint problems (?possibly lumbar problems in past as well)--reassured no evidence of ongoing problems with pinched nerve  or need for further eval at this time, other than prn treatments by chiro for SI joint problems.  Her unusual pattern of shoewear do not suggest foot drop, but abnormal gait.  Consider getting eval by skilled salesperson at sports store to get proper fitting/supportive shoe.  Crohn's disease--stable.  In process of changing meds per GI.  Discussed monthly self breast exams and yearly mammograms after the age of 45(she had baseline mammo at age 37; at least 30 minutes of aerobic activity at least 5 days/week including weight-bearing exercise at least twice per week; proper sunscreen use reviewed; healthy diet, including goals of calcium and vitamin D intake and alcohol recommendations (less than or equal to 1 drink/day) reviewed; regular seatbelt use; changing batteries in smoke detectors.  Immunization recommendations discussed--UTD.  Colonoscopy recommendations reviewed--UTD.  45 minute total visit, more than 15-20 mins spent counseling re: osteopenia, depression, concerns over her back, feet, falls

## 2013-10-04 ENCOUNTER — Encounter: Payer: Self-pay | Admitting: Family Medicine

## 2013-10-04 LAB — FECAL LACTOFERRIN, QUANT: Lactoferrin: POSITIVE

## 2013-10-05 ENCOUNTER — Encounter: Payer: Self-pay | Admitting: Internal Medicine

## 2013-10-05 NOTE — Progress Notes (Signed)
Quick Note:  + for inflammation ______

## 2013-10-17 ENCOUNTER — Ambulatory Visit (INDEPENDENT_AMBULATORY_CARE_PROVIDER_SITE_OTHER): Payer: 59 | Admitting: Internal Medicine

## 2013-10-17 ENCOUNTER — Encounter: Payer: Self-pay | Admitting: Internal Medicine

## 2013-10-17 VITALS — BP 110/70 | HR 74 | Ht 64.0 in | Wt 170.0 lb

## 2013-10-17 DIAGNOSIS — R197 Diarrhea, unspecified: Secondary | ICD-10-CM

## 2013-10-17 DIAGNOSIS — K5000 Crohn's disease of small intestine without complications: Secondary | ICD-10-CM

## 2013-10-17 NOTE — Progress Notes (Signed)
         Subjective:    Patient ID: Lindsay Mccarthy, female    DOB: 25-Aug-1976, 37 y.o.   MRN: [id number]  HPI The patient returns for follow-up of Crohn's ileitis and diarrhea. When last seen she had a + fecal lactoferrin but NL CBC and CRP. She had improved as we increased AZA dose again. She says she is now with more diarrhea since traveling to [place] to see family. Increased # loose stools - not much if any abdominal pain. No fever. Medications, allergies, past medical history, past surgical history, family history and social history are reviewed and updated in the EMR.  Review of Systems As above - denies stressors    Objective:   Physical Exam General:  NAD Abdomen:  soft and nontender, BS+  Data Reviewed:  Recent labs     Assessment & Plan:  Crohn's disease of small intestine  Diarrhea - Plan: Clostridium Difficile by PCR

## 2013-10-17 NOTE — Patient Instructions (Signed)
Your physician has requested that you go to the basement for the following lab work before leaving today: C-diff    I appreciate the opportunity to care for you.

## 2013-10-17 NOTE — Assessment & Plan Note (Addendum)
Still not at baseline c diff check ? Flagyl If persistent issues will probably need a colonoscopy

## 2013-10-23 ENCOUNTER — Other Ambulatory Visit: Payer: 59

## 2013-10-23 DIAGNOSIS — R197 Diarrhea, unspecified: Secondary | ICD-10-CM

## 2013-10-26 LAB — CLOSTRIDIUM DIFFICILE BY PCR: Toxigenic C. Difficile by PCR: NOT DETECTED

## 2013-10-28 NOTE — Progress Notes (Signed)
Quick Note:  C diff negative Trial of metronidazole and then have her let me know ______

## 2013-11-25 ENCOUNTER — Encounter: Payer: Self-pay | Admitting: Family Medicine

## 2013-11-26 ENCOUNTER — Telehealth: Payer: Self-pay | Admitting: Family Medicine

## 2013-11-26 DIAGNOSIS — F325 Major depressive disorder, single episode, in full remission: Secondary | ICD-10-CM

## 2013-11-26 MED ORDER — BUPROPION HCL ER (SR) 150 MG PO TB12: ORAL_TABLET | ORAL | Status: DC

## 2013-11-26 NOTE — Telephone Encounter (Signed)
Advise pt--I sent 15m dose to her pharmacy (of the wellbutrin SR, to be taken BID) as requested.  I sent it for total of 6 months.  If she isn't improved after 6 weeks, she should call to schedule f/u.  If she is doing well, then schedule a f/u in 6 months (when she needs refills, she needs appt)

## 2013-11-26 NOTE — Telephone Encounter (Signed)
Dr. Tomi Bamberger,     At my yearly exam in December we talked about the dosage of my bupropion. I have decided that I would like to try the higher dose. Can you please call in the higher dose to [facility] located on [address]?     Thank you in advance,    Lindsay Mccarthy

## 2013-11-27 NOTE — Telephone Encounter (Signed)
I left the patient with Dr. Pascal Lux recommendations. CLS

## 2014-02-21 ENCOUNTER — Other Ambulatory Visit: Payer: Self-pay | Admitting: Internal Medicine

## 2014-02-26 ENCOUNTER — Telehealth: Payer: Self-pay

## 2014-02-26 ENCOUNTER — Other Ambulatory Visit: Payer: 59

## 2014-02-26 DIAGNOSIS — K5090 Crohn's disease, unspecified, without complications: Secondary | ICD-10-CM

## 2014-02-26 NOTE — Telephone Encounter (Signed)
Patient informed that lab orders put in, she's headed out of town today and will do when she comes back to town in several weeks.

## 2014-02-26 NOTE — Telephone Encounter (Signed)
Message copied by [place], Delvon Chipps E on Tue Feb 26, 2014  2:33 PM ------      Message from: Gatha Mayer      Created: Tue Feb 26, 2014  2:29 PM       CBC, LFT's      ----- Message -----         From: Lorriane Dehart E [place], CMA         Sent: 02/26/2014  12:50 PM           To: Gatha Mayer, MD            Patient stopped by the lab , thinks she's due for labs because of taking Imuran.  Last done November 2014.  Please advise what you would like drawn and I will put the orders in Sir.  Thank you.       ------

## 2014-04-24 ENCOUNTER — Other Ambulatory Visit: Payer: Self-pay | Admitting: Internal Medicine

## 2014-04-26 ENCOUNTER — Other Ambulatory Visit (INDEPENDENT_AMBULATORY_CARE_PROVIDER_SITE_OTHER): Payer: 59

## 2014-04-26 DIAGNOSIS — K5090 Crohn's disease, unspecified, without complications: Secondary | ICD-10-CM

## 2014-04-26 LAB — HEPATIC FUNCTION PANEL
ALT: 18 U/L (ref 0–35)
AST: 27 U/L (ref 0–37)
Albumin: 3.7 g/dL (ref 3.5–5.2)
Alkaline Phosphatase: 50 U/L (ref 39–117)
BILIRUBIN DIRECT: 0.1 mg/dL (ref 0.0–0.3)
TOTAL PROTEIN: 7.1 g/dL (ref 6.0–8.3)
Total Bilirubin: 0.4 mg/dL (ref 0.2–1.2)

## 2014-04-26 LAB — CBC WITH DIFFERENTIAL/PLATELET
Basophils Absolute: 0 10*3/uL (ref 0.0–0.1)
Basophils Relative: 0.5 % (ref 0.0–3.0)
EOS ABS: 0 10*3/uL (ref 0.0–0.7)
Eosinophils Relative: 0.6 % (ref 0.0–5.0)
HEMATOCRIT: 38.4 % (ref 36.0–46.0)
Hemoglobin: 13.3 g/dL (ref 12.0–15.0)
LYMPHS ABS: 1.4 10*3/uL (ref 0.7–4.0)
Lymphocytes Relative: 16.5 % (ref 12.0–46.0)
MCHC: 34.6 g/dL (ref 30.0–36.0)
MCV: 99 fl (ref 78.0–100.0)
Monocytes Absolute: 0.8 10*3/uL (ref 0.1–1.0)
Monocytes Relative: 9.7 % (ref 3.0–12.0)
NEUTROS PCT: 72.7 % (ref 43.0–77.0)
Neutro Abs: 6.1 10*3/uL (ref 1.4–7.7)
Platelets: 307 10*3/uL (ref 150.0–400.0)
RBC: 3.88 Mil/uL (ref 3.87–5.11)
RDW: 14.7 % (ref 11.5–15.5)
WBC: 8.4 10*3/uL (ref 4.0–10.5)

## 2014-04-30 NOTE — Progress Notes (Signed)
Quick Note:  Labs ok Need to repeat CBC and LFT in 4 months Please ask her if she is having any problems with Crohn's (diarrhea, pain, bleeding, vomiting) and let me know  ______

## 2014-05-01 ENCOUNTER — Telehealth: Payer: Self-pay

## 2014-05-01 ENCOUNTER — Other Ambulatory Visit: Payer: Self-pay

## 2014-05-01 DIAGNOSIS — Z79899 Other long term (current) drug therapy: Secondary | ICD-10-CM

## 2014-05-01 NOTE — Telephone Encounter (Signed)
Left message for pt to call back with female answering the phone-labs to be repeated in November

## 2014-05-01 NOTE — Telephone Encounter (Signed)
Message copied by Greggory Keen on Wed May 01, 2014 10:57 AM ------      Message from: Gatha Mayer      Created: Tue Apr 30, 2014  8:04 PM       Labs ok      Need to repeat CBC and LFT in 4 months      Please ask her if she is having any problems with Crohn's (diarrhea, pain, bleeding, vomiting) and let me know       ------

## 2014-05-01 NOTE — Telephone Encounter (Signed)
Patient informed-denies any problems presently

## 2014-05-25 ENCOUNTER — Other Ambulatory Visit: Payer: Self-pay | Admitting: Family Medicine

## 2014-05-27 NOTE — Telephone Encounter (Signed)
Is this okay to refill? 

## 2014-05-27 NOTE — Telephone Encounter (Signed)
Left message for patient to call back-2nd message today.

## 2014-05-27 NOTE — Telephone Encounter (Signed)
Her dose was increased in January (last CPE was 10/2013, and lower dose was refilled x 1 yr).  Please call pt and see how the 120m dose is working for her, and if she wants to continue at that dose. If so, okay to refill through December, and please schedule her for CPE.  Thanks

## 2014-05-28 NOTE — Telephone Encounter (Signed)
Pt called back and stated that the new dose is working well for her. She states she received a call from [place] inquiring. Pt states she would like to stay on that dose. Pt did schedule a CPE for Dec. 7. [facility] [address]

## 2014-05-28 NOTE — Telephone Encounter (Signed)
done

## 2014-09-08 ENCOUNTER — Other Ambulatory Visit: Payer: Self-pay | Admitting: Internal Medicine

## 2014-09-11 ENCOUNTER — Other Ambulatory Visit: Payer: Self-pay | Admitting: Internal Medicine

## 2014-10-02 ENCOUNTER — Other Ambulatory Visit (INDEPENDENT_AMBULATORY_CARE_PROVIDER_SITE_OTHER): Payer: 59

## 2014-10-02 DIAGNOSIS — Z79899 Other long term (current) drug therapy: Secondary | ICD-10-CM

## 2014-10-02 LAB — CBC WITH DIFFERENTIAL/PLATELET
BASOS ABS: 0 10*3/uL (ref 0.0–0.1)
Basophils Relative: 0.2 % (ref 0.0–3.0)
EOS ABS: 0.1 10*3/uL (ref 0.0–0.7)
Eosinophils Relative: 0.7 % (ref 0.0–5.0)
HCT: 40.6 % (ref 36.0–46.0)
HEMOGLOBIN: 13.6 g/dL (ref 12.0–15.0)
Lymphocytes Relative: 15.4 % (ref 12.0–46.0)
Lymphs Abs: 1.4 10*3/uL (ref 0.7–4.0)
MCHC: 33.5 g/dL (ref 30.0–36.0)
MCV: 99.4 fl (ref 78.0–100.0)
MONOS PCT: 10.9 % (ref 3.0–12.0)
Monocytes Absolute: 1 10*3/uL (ref 0.1–1.0)
NEUTROS ABS: 6.4 10*3/uL (ref 1.4–7.7)
Neutrophils Relative %: 72.8 % (ref 43.0–77.0)
PLATELETS: 340 10*3/uL (ref 150.0–400.0)
RBC: 4.08 Mil/uL (ref 3.87–5.11)
RDW: 14.1 % (ref 11.5–15.5)
WBC: 8.8 10*3/uL (ref 4.0–10.5)

## 2014-10-03 NOTE — Progress Notes (Signed)
Quick Note:  CBC NL Was supposed to get LFT's also - I see order but it was not done ______

## 2014-10-07 ENCOUNTER — Ambulatory Visit (INDEPENDENT_AMBULATORY_CARE_PROVIDER_SITE_OTHER): Payer: 59 | Admitting: Family Medicine

## 2014-10-07 ENCOUNTER — Encounter: Payer: Self-pay | Admitting: Family Medicine

## 2014-10-07 VITALS — BP 118/62 | HR 64 | Ht 64.0 in | Wt 176.0 lb

## 2014-10-07 DIAGNOSIS — Z0000 Encounter for general adult medical examination without abnormal findings: Secondary | ICD-10-CM

## 2014-10-07 DIAGNOSIS — F32A Depression, unspecified: Secondary | ICD-10-CM

## 2014-10-07 DIAGNOSIS — Z111 Encounter for screening for respiratory tuberculosis: Secondary | ICD-10-CM

## 2014-10-07 DIAGNOSIS — E781 Pure hyperglyceridemia: Secondary | ICD-10-CM

## 2014-10-07 DIAGNOSIS — Z5181 Encounter for therapeutic drug level monitoring: Secondary | ICD-10-CM

## 2014-10-07 DIAGNOSIS — F411 Generalized anxiety disorder: Secondary | ICD-10-CM

## 2014-10-07 DIAGNOSIS — Z23 Encounter for immunization: Secondary | ICD-10-CM

## 2014-10-07 DIAGNOSIS — F329 Major depressive disorder, single episode, unspecified: Secondary | ICD-10-CM

## 2014-10-07 LAB — POCT URINALYSIS DIPSTICK
Bilirubin, UA: NEGATIVE
GLUCOSE UA: NEGATIVE
Ketones, UA: NEGATIVE
LEUKOCYTES UA: NEGATIVE
NITRITE UA: NEGATIVE
Protein, UA: NEGATIVE
Spec Grav, UA: 1.01
UROBILINOGEN UA: NEGATIVE
pH, UA: 7

## 2014-10-07 LAB — HEPATIC FUNCTION PANEL
ALT: 11 U/L (ref 0–35)
AST: 17 U/L (ref 0–37)
Albumin: 3.6 g/dL (ref 3.5–5.2)
Alkaline Phosphatase: 54 U/L (ref 39–117)
BILIRUBIN DIRECT: 0.1 mg/dL (ref 0.0–0.3)
BILIRUBIN INDIRECT: 0.5 mg/dL (ref 0.2–1.2)
Total Bilirubin: 0.6 mg/dL (ref 0.2–1.2)
Total Protein: 6.4 g/dL (ref 6.0–8.3)

## 2014-10-07 LAB — VITAMIN B12: Vitamin B-12: 382 pg/mL (ref 211–911)

## 2014-10-07 LAB — TSH: TSH: 1.357 u[IU]/mL (ref 0.350–4.500)

## 2014-10-07 MED ORDER — BUPROPION HCL ER (SR) 100 MG PO TB12: 100.0000 mg | ORAL_TABLET | Freq: Two times a day (BID) | ORAL | Status: DC

## 2014-10-07 MED ORDER — ESCITALOPRAM OXALATE 10 MG PO TABS: ORAL_TABLET | ORAL | Status: DC

## 2014-10-07 NOTE — Patient Instructions (Signed)
  HEALTH MAINTENANCE RECOMMENDATIONS:  It is recommended that you get at least 30 minutes of aerobic exercise at least 5 days/week (for weight loss, you may need as much as 60-90 minutes). This can be any activity that gets your heart rate up. This can be divided in 10-15 minute intervals if needed, but try and build up your endurance at least once a week.  Weight bearing exercise is also recommended twice weekly.  Eat a healthy diet with lots of vegetables, fruits and fiber.  "Colorful" foods have a lot of vitamins (ie green vegetables, tomatoes, red peppers, etc).  Limit sweet tea, regular sodas and alcoholic beverages, all of which has a lot of calories and sugar.  Up to 1 alcoholic drink daily may be beneficial for women (unless trying to lose weight, watch sugars).  Drink a lot of water.  Calcium recommendations are 1200-1500 mg daily (1500 mg for postmenopausal women or women without ovaries), and vitamin D 1000 IU daily.  This should be obtained from diet and/or supplements (vitamins), and calcium should not be taken all at once, but in divided doses.  Monthly self breast exams and yearly mammograms for women over the age of 64 is recommended.  Sunscreen of at least SPF 30 should be used on all sun-exposed parts of the skin when outside between the hours of 10 am and 4 pm (not just when at beach or pool, but even with exercise, golf, tennis, and yard work!)  Use a sunscreen that says "broad spectrum" so it covers both UVA and UVB rays, and make sure to reapply every 1-2 hours.  Remember to change the batteries in your smoke detectors when changing your clock times in the spring and fall.  Use your seat belt every time you are in a car, and please drive safely and not be distracted with cell phones and texting while driving.  Please follow a lowfat diet--cutting back on sweets, sugars, fried foods.  Try taking omega-3 fish oil up to 3000-4027m daily, as we discussed. Check with Dr. GCarlean Purlto  see when your next colonoscopy is due.  Decrease wellbutrin to 1012mBID, and add in Lexapro 1053mstarting just at 1/2 tablet daily.  Can stay on the half tablet for up to a month, or can increase to full tablet after 1-2 weeks if not noticing any improvement.

## 2014-10-07 NOTE — Progress Notes (Signed)
Chief Complaint  Patient presents with  . Annual Exam    nonfasting annual exam with pelvic(pap done last year). UA showed trace blood-no symptoms. Has been having some allergy issues-switched from zyrtec to allegra, helped for a while. Also wellbutrin was changed last year and feels like it is not working (switched to [employer] after eeing the 150 XR in her stool). Also would like to have TB test done-has this done with Dr.Gessner usually.    Lindsay Mccarthy is a 38 y.o. female who presents for a complete physical.  She has the following concerns:  Unlike last year, she is being more careful and has only fallen once (had fallen more frequently in the past).  She is no longer having any back pain or problems.  Since wearing tennis shoes all the time, she does much better, doesn't report any weakness.  She only sees chiropractor prn now (twice in the last year).  She previously had abnormal paps, HPV. Treated with LEEP in 2011. Had 2 normal paps in 2012, and normal pap here 10/2012 and 10/2013. Has Mirena IUD--gets some cramping and mood changes; she might spot for a day every 6 weeks or so. She has never been pregnant, and doesn't want children. She has been in a monogamous relationship x 8 years without concern for STD. Denies vaginal discharge, odor, itch.   Depression follow-up: changed to SR wellbutrin 131m BID after seeing Wellbutrin XL 1528min her stool. It didn't seem to work quite as well as the original 15078mL that was adequately absorbed (not seen in stool).  Dose was increased to 150m95mD (of the SR) end of January 2015.  She hasn't noticed any change since the dose was raised.  She sometimes feels like she is in a fog, trouble concentrating, somewhat dissociated.  She does admit to having a lot of allergies and congestion, but doesn't recall feeling like this when she was just as congested, and when she was on prior Wellbutrin that they stopped making.  She has also felt more anxious since  being on this dose.  CymzBobette Mo some psych side effects, and she finds that the wellbutrin has kept her more even (TomGershon Mussel told her that). She recalls being on Prozac about 10 years ago (very irritable, wanted to stab cat that wouldn't be quite--irrational thoughts).  She also had vivid dreams.  Target symptoms--mood swings, feeling more anxious. Feels "blah".  No crying. No SI/HI  Allergies:  She changed from Zyrtec to Allegra about 6 weeks ago.  She uses tylenol sinus just if needed on bad days. She feels like the congestion is better, but she continues to sneeze a lot.  She was told to avoid nasal steroids (due to her GI meds).  Sometimes she gets significant sinus pressure, into her teeth, or feels somewhat off-balanced.  She brings in copies of labs from her insurance.  Chol 125, HDL 41, LDL 33, TG were elvated at 253 (she was fasting). Fasting glucose 77   Immunization History  Administered Date(s) Administered  . Hepatitis A 06/29/2012, 01/04/2013  . Influenza Split 08/09/2012  . Influenza,inj,Quad PF,36+ Mos 09/18/2013, 10/07/2014  . PPD Test 04/30/2013  . Pneumococcal Polysaccharide-23 07/06/2012   Last Pap smear: 10/2013 Last mammogram: baseline mammo last year (03/2013) Last colonoscopy: 03/2009 Last DEXA: 09/2013 Ophtho: yearly Dentist: twice yearly  Exercise: Not much currently, just walking the dog.  Previously used the treadmill three days/week Joined "[employer]"--weight loss program through her job/insurance  Past Medical  History  Diagnosis Date  . Asthma     Childhood  . Hyperlipemia   . Cervical dysplasia   . Crohn's disease of small intestine 06/24/2012    Diagnosed 1999, in [place]. Ileitis only then. Treated with Imuran Remicade and prednisone.  Noncompliant with therapy. 2004 return to care and was treated with Remicade prednisone Pentasa Cipro and Flagyl. 2006 status post right hemicolectomy. Subsequently treated with azathioprine and Cimzia. 200 mg every  other week.  azathioprine was added in 2010. Prometheus TP MT enzyme was negative. Has ha  . Osteopenia of femur neck T. score -1.4 09/29/2011    09/2013 T-1.6  . Anemia     related to Crohns flares  . Seasonal allergic rhinitis   . History of recurrent UTI (urinary tract infection)   . Eczema     arms and behind knees, worse in winter  . Wears glasses   . Scaphoid fracture of wrist 2013    left  . Arthritis     knees, feet, hands, wrists, related to Crohns flare  . HPV (human papilloma virus) infection   . Papanicolaou smear 12/12    last abnormal 2011  . Depression   . B12 deficiency     monitored/treated by Dr. Carlean Purl    Past Surgical History  Procedure Laterality Date  . Leep  2011  . Colonoscopy  multiple    scanned  . Esophagogastroduodenoscopy  multiple    scanned  . Hemicolectomy  2006  . Appendectomy    . Wisdom tooth extraction      History   Social History  . Marital Status: Single    Spouse Name: N/A    Number of Children: N/A  . Years of Education: N/A   Occupational History  . Quality Analyst    Social History Main Topics  . Smoking status: Former Smoker -- 1.00 packs/day for 4 years    Quit date: 11/18/1998  . Smokeless tobacco: Never Used  . Alcohol Use: 0.0 oz/week    0 Not specified per week     Comment: rarely  . Drug Use: No  . Sexual Activity:    Partners: Male    Birth Control/ Protection: IUD     Comment: Mirena   Other Topics Concern  . Not on file   Social History Narrative   The patient is divorced.  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   Moved from [place] to [place] in 2013.   Past smoker   No alcohol   2 caffeinated beverages a day   She reports she is compliant with sunscreen given her increased risk of sun damage and skin cancer on azathioprine      Updated 10/03/2014    Family History  Problem Relation Age of Onset   . Breast cancer Mother 2  . Hypertension Mother   . Breast cancer Mother   . Colon polyps Father   . Diabetes Father     borderline  . Hypertension Father   . Colon cancer Paternal Grandfather   . Colon cancer Paternal Grandfather   . Multiple sclerosis Sister   . Stroke Maternal Grandmother   . Lung cancer Maternal Grandmother   . Heart disease Paternal Grandmother     Outpatient Encounter Prescriptions as of 10/07/2014  Medication Sig Note  . azaTHIOprine (IMURAN) 50 MG tablet Take 150 mg by mouth daily.   Marland Kitchen  buPROPion (WELLBUTRIN SR) 150 MG 12 hr tablet TAKE 1 TABLET BY MOUTH 2 TIMES DAILY.   Marland Kitchen Certolizumab Pegol (CIMZIA PREFILLED) 2 X 200 MG/ML KIT Inject 200 mg into the skin every 14 (fourteen) days.   . cyanocobalamin (,VITAMIN B-12,) 1000 MCG/ML injection INJECT AS DIRECTED EVERY 30 DAYS   . diphenhydrAMINE (SOMINEX) 25 MG tablet Take 25 mg by mouth at bedtime as needed for sleep. 10/07/2014: Takes every night  . fexofenadine (ALLEGRA) 180 MG tablet Take 180 mg by mouth daily.   Marland Kitchen levonorgestrel (MIRENA) 20 MCG/24HR IUD 1 each by Intrauterine route once.   . diphenoxylate-atropine (LOMOTIL) 2.5-0.025 MG per tablet Take 1 tablet by mouth 4 (four) times daily as needed for diarrhea or loose stools. (Patient not taking: Reported on 10/07/2014) 10/03/2013: Uses prn  . [DISCONTINUED] azaTHIOprine (IMURAN) 50 MG tablet TAKE 3 TABLETS BY MOUTH DAILY   . [DISCONTINUED] Cetirizine HCl (ZYRTEC PO) Take by mouth.   . [DISCONTINUED] Probiotic Product (ALIGN PO) Take 1 capsule by mouth daily.     Allergies  Allergen Reactions  . Ibuprofen     Avoids due to Crohns disease  . Prednisone     "crazy", hallucinatory   ROS: The patient denies anorexia, fever, headaches, vision changes, decreased hearing, ear pain, sore throat, breast concerns, chest pain, palpitations, dizziness (rare mild dysequilibrium related to allergies), syncope, dyspnea on exertion, cough, swelling, nausea, vomiting,  indigestion/heartburn, hematuria, incontinence, dysuria, vaginal bleeding (1 day of spotting every 6 weeks, maybe), discharge, odor or itch, genital lesions, numbness, tingling, tremor, suspicious skin lesions, anxiety, abnormal bleeding/bruising, or enlarged lymph nodes.  Not seeing any blood in stool, slightly loose stools a few times/day, which is her baseline. Some seasonal allergies, which seems to be worse this year. Some pain in wrists, mild--mostly just the wrist that she broke, with weather changes.  +weight gain, hasn't been exercising. +depression/anxiety   PHYSICAL EXAM:  BP 118/62 mmHg  Pulse 64  Ht 5' 4"  (1.626 m)  Wt 176 lb (79.833 kg)  BMI 30.20 kg/m2   General Appearance:   Alert, cooperative, no distress, appears stated age  Head:   Normocephalic, without obvious abnormality, atraumatic  Eyes:   PERRL, conjunctiva/corneas clear, EOM's intact, fundi   benign  Ears:   Normal TM's and external ear canals  Nose:  Nares normal, mucosa mild-mod edematous, pale, no drainage or sinus tenderness  Throat:  Lips, mucosa, and tongue normal; teeth and gums normal  Neck:  Supple, no lymphadenopathy; thyroid: no enlargement/tenderness/nodules; no carotid  bruit or JVD  Back:  Spine nontender, no curvature, ROM normal, no CVAtenderness.  Lungs:   Clear to auscultation bilaterally without wheezes, rales or ronchi; respirations unlabored  Chest Wall:   No tenderness or deformity  Heart:   Regular rate and rhythm, S1 and S2 normal, no murmur, rub  or gallop  Breast Exam:   No tenderness, masses, or nipple discharge or inversion. No axillary lymphadenopathy  Abdomen:   Soft, non-tender, nondistended, normoactive bowel sounds,   no masses, no hepatosplenomegaly  Genitalia:   Normal external genitalia without lesions. BUS and vagina normal; IUD strings palpable; no cervical motion tenderness. No abnormal vaginal  discharge. Uterus and adnexa not enlarged, nontender, no masses. Pap not performed  Rectal:   Not performed due to age<40 and being under care of GI doctor  Extremities:  No clubbing, cyanosis or edema  Pulses:  2+ and symmetric all extremities  Skin:  Skin color, texture, turgor normal, no rashes  or lesions  Lymph nodes:  Cervical, supraclavicular, and axillary nodes normal  Neurologic:  CNII-XII intact, normal strength, sensation and gait; reflexes 2+ and symmetric throughout.    Psych: Normal mood, affect, hygiene and grooming.  ASSESSMENT/PLAN:  Annual physical exam - Plan: Visual acuity screening, POCT Urinalysis Dipstick, TSH  Need for prophylactic vaccination and inoculation against influenza - Plan: Flu Vaccine QUAD 36+ mos PF IM (Fluarix Quad PF)  Medication monitoring encounter - Plan: Vitamin B12, Hepatic function panel  Depression - suboptimal control of moods on wellbutrin alone.  Add Lexapro, cut back dose of wellbutrin to former dose - Plan: escitalopram (LEXAPRO) 10 MG tablet, buPROPion (WELLBUTRIN SR) 100 MG 12 hr tablet  Anxiety state - Plan: escitalopram (LEXAPRO) 10 MG tablet  Screening examination for pulmonary tuberculosis - Plan: PPD  Hypertriglyceridemia - lowfat diet reviewed in detail.  Encouraged trial of omega-3 fish oil.  recommend repeat in 3-6 months; risks of high TG reviewed   Decrease wellbutrin to 152m BID, and add in Lexapro 116m starting just at 1/2 tablet daily.  Can stay on the half tablet for up to a month, or can increase to full tablet after 1-2 weeks if not noticing any improvement.  Per chart, they missed orders for LFT's at Dr. GeMosetta [facility]. Also check B12, TSH PPD placed   Discussed monthly self breast exams and yearly mammograms after the age of 4280had baseline last year); at least 30 minutes of aerobic activity at least 5 days/week; proper sunscreen use  reviewed; healthy diet, including goals of calcium and vitamin D intake and alcohol recommendations (less than or equal to 1 drink/day) reviewed; regular seatbelt use; changing batteries in smoke detectors.  Immunization recommendations discussed--flu shot given.  Colonoscopy recommendations discussed--has been over 5 years, to check with Dr. GeCarlean Purlo see when she is due next.   F/u in 6-8 weeks if moods not improved.

## 2014-10-08 ENCOUNTER — Other Ambulatory Visit: Payer: Self-pay

## 2014-10-08 DIAGNOSIS — Z79899 Other long term (current) drug therapy: Secondary | ICD-10-CM

## 2014-10-08 DIAGNOSIS — Z7960 Long term (current) use of unspecified immunomodulators and immunosuppressants: Secondary | ICD-10-CM

## 2014-10-08 NOTE — Progress Notes (Signed)
Quick Note:  Thanks Our lab missed it ______

## 2014-10-09 LAB — TB SKIN TEST
Induration: 0 mm
TB Skin Test: NEGATIVE

## 2014-10-11 ENCOUNTER — Encounter: Payer: Self-pay | Admitting: Family Medicine

## 2014-10-24 ENCOUNTER — Other Ambulatory Visit: Payer: Self-pay | Admitting: Family Medicine

## 2014-12-06 ENCOUNTER — Other Ambulatory Visit: Payer: Self-pay | Admitting: Internal Medicine

## 2015-02-18 ENCOUNTER — Other Ambulatory Visit (INDEPENDENT_AMBULATORY_CARE_PROVIDER_SITE_OTHER): Payer: 59

## 2015-02-18 DIAGNOSIS — Z79899 Other long term (current) drug therapy: Secondary | ICD-10-CM | POA: Diagnosis not present

## 2015-02-18 DIAGNOSIS — Z7960 Long term (current) use of unspecified immunomodulators and immunosuppressants: Secondary | ICD-10-CM

## 2015-02-18 LAB — CBC WITH DIFFERENTIAL/PLATELET
BASOS PCT: 0.4 % (ref 0.0–3.0)
Basophils Absolute: 0 10*3/uL (ref 0.0–0.1)
EOS PCT: 0.5 % (ref 0.0–5.0)
Eosinophils Absolute: 0 10*3/uL (ref 0.0–0.7)
HCT: 37.2 % (ref 36.0–46.0)
HEMOGLOBIN: 12.8 g/dL (ref 12.0–15.0)
LYMPHS PCT: 18.1 % (ref 12.0–46.0)
Lymphs Abs: 1.1 10*3/uL (ref 0.7–4.0)
MCHC: 34.5 g/dL (ref 30.0–36.0)
MCV: 97.7 fl (ref 78.0–100.0)
Monocytes Absolute: 0.6 10*3/uL (ref 0.1–1.0)
Monocytes Relative: 10 % (ref 3.0–12.0)
NEUTROS ABS: 4.4 10*3/uL (ref 1.4–7.7)
NEUTROS PCT: 71 % (ref 43.0–77.0)
Platelets: 293 10*3/uL (ref 150.0–400.0)
RBC: 3.81 Mil/uL — AB (ref 3.87–5.11)
RDW: 14.9 % (ref 11.5–15.5)
WBC: 6.2 10*3/uL (ref 4.0–10.5)

## 2015-02-18 LAB — HEPATIC FUNCTION PANEL
ALT: 10 U/L (ref 0–35)
AST: 16 U/L (ref 0–37)
Albumin: 3.4 g/dL — ABNORMAL LOW (ref 3.5–5.2)
Alkaline Phosphatase: 53 U/L (ref 39–117)
BILIRUBIN DIRECT: 0.1 mg/dL (ref 0.0–0.3)
Total Bilirubin: 0.5 mg/dL (ref 0.2–1.2)
Total Protein: 6.1 g/dL (ref 6.0–8.3)

## 2015-02-19 ENCOUNTER — Other Ambulatory Visit: Payer: Self-pay

## 2015-02-19 DIAGNOSIS — K5000 Crohn's disease of small intestine without complications: Secondary | ICD-10-CM

## 2015-02-19 MED ORDER — AZATHIOPRINE 50 MG PO TABS: 150.0000 mg | ORAL_TABLET | Freq: Every day | ORAL | Status: DC

## 2015-02-19 NOTE — Progress Notes (Signed)
Quick Note:  CBC and LFT's ok No signs of toxicity from azathioprine She should see me in next few months for f/u please OK to refill AZA x 4 months Needs same labs in 4 months ______

## 2015-02-24 ENCOUNTER — Encounter: Payer: Self-pay | Admitting: Family Medicine

## 2015-02-24 ENCOUNTER — Ambulatory Visit (INDEPENDENT_AMBULATORY_CARE_PROVIDER_SITE_OTHER): Payer: 59 | Admitting: Family Medicine

## 2015-02-24 VITALS — BP 102/62 | HR 72 | Ht 64.0 in | Wt 175.8 lb

## 2015-02-24 DIAGNOSIS — S60212A Contusion of left wrist, initial encounter: Secondary | ICD-10-CM | POA: Diagnosis not present

## 2015-02-24 DIAGNOSIS — S20219A Contusion of unspecified front wall of thorax, initial encounter: Secondary | ICD-10-CM

## 2015-02-24 DIAGNOSIS — M62838 Other muscle spasm: Secondary | ICD-10-CM

## 2015-02-24 MED ORDER — CYCLOBENZAPRINE HCL 10 MG PO TABS: 5.0000 mg | ORAL_TABLET | Freq: Three times a day (TID) | ORAL | Status: DC | PRN

## 2015-02-24 NOTE — Progress Notes (Signed)
Chief Complaint  Patient presents with  . Fall    was out walking yesterday and she fell and hit the ground. Does not recall actual fall, doesn't remember tripping or anything. From the waist up her ribs and stomach are tender. Both shoulders hurt, left is worse than right. And left wrist pain. Also her neck is hurting.    She was walking on a paved trail with her husband and dog.  She landed "like a belly flop on the pavement", landing hard.  She has scrapes on her knees, left palm, felt an impact on her chest--it knocked the breath out of her.  She sat for a few minutes, got up and was able to finish her walk.  Soreness and pain has been worse today than yesterday.    She iced her left hand (where it felt the most sore) and cleaned her wounds after her walk.  Today the hand doesn't bother her as much, but her neck, back/shoulders, lower ribs.  Hurts to take a deep breath, feels better if she stretches.  She is tender to touch over her upper stomach.  Today she iced the chest and left wrist today. Denies swelling.  Has some pulling sensation when she rotates her wrist out like to carry something, but otherwise no pain with her activities or movement. Pain seemed to get worse while sitting at work today.  Previously had a scaphoid fracture--this doesn't feel the same.  She took 2 tylenol this morning, didn't help. She states she can't take ibuprofen due to her Crohn's disease.  PMH, PSH, SH reviewed.  Current Outpatient Prescriptions on File Prior to Visit  Medication Sig Dispense Refill  . azaTHIOprine (IMURAN) 50 MG tablet TAKE 3 TABLETS BY MOUTH DAILY 90 tablet 2  . buPROPion (WELLBUTRIN SR) 100 MG 12 hr tablet Take 1 tablet (100 mg total) by mouth 2 (two) times daily. 60 tablet 11  . Certolizumab Pegol (CIMZIA PREFILLED) 2 X 200 MG/ML KIT Inject 200 mg into the skin every 14 (fourteen) days. 1 kit 6  . cyanocobalamin (,VITAMIN B-12,) 1000 MCG/ML injection INJECT AS DIRECTED EVERY 30 DAYS  1 mL 11  . diphenhydrAMINE (SOMINEX) 25 MG tablet Take 25 mg by mouth at bedtime as needed for sleep.    Marland Kitchen escitalopram (LEXAPRO) 10 MG tablet Take 1/2 to 1 tablet by mouth once daily as directed. 30 tablet 5  . fexofenadine (ALLEGRA) 180 MG tablet Take 180 mg by mouth daily.    Marland Kitchen levonorgestrel (MIRENA) 20 MCG/24HR IUD 1 each by Intrauterine route once.    . diphenoxylate-atropine (LOMOTIL) 2.5-0.025 MG per tablet Take 1 tablet by mouth 4 (four) times daily as needed for diarrhea or loose stools. (Patient not taking: Reported on 10/07/2014) 120 tablet 0   No current facility-administered medications on file prior to visit.   Allergies  Allergen Reactions  . Ibuprofen     Avoids due to Crohns disease  . Prednisone     "crazy", hallucinatory   ROS:  No fevers, chills, bleeding, headache, dizziness, chest pain, palpitations, GI or GU complaints, edema or other concerns.  No vertigo.  No numbness, tingling, URI symptoms or other complaints. She reports that her moods are significantly better since Lexapro was added to her regimen at her last visit.   PHYSICAL EXAM: BP 102/62 mmHg  Pulse 72  Ht 5' 4"  (1.626 m)  Wt 175 lb 12.8 oz (79.742 kg)  BMI 30.16 kg/m2 Well developed, well-appearing female in no distress HEENT:  PERRL, EOMI, conjunctiva clear. Neck:  No c-spine tenderness.  +tenderness in trapezius muscles bilaterally, somewhat tight.   Rest of spine is nontender Chest: mildly tender along costochondral junctions and anterior chest.   Abdomen: mildly diffusely tender in upper abdomen, small focal area in RUQ that is very sensitive to light touch.  Ribs are nontender--very slightly tender, no bony stepoffs or acute tender area  Left palm, there is some abrasion at the anterior palm/wrist She has some focal tenderness over some of the bones at the anterior wrist, in the area of the abrasion.  Rest of wrist is nontender, no swelling, FROM 2+ pulses, brisk capillary  refill  ASSESSMENT/PLAN:  Wrist contusion, left, initial encounter  Muscle spasm - Plan: cyclobenzaprine (FLEXERIL) 10 MG tablet  Chest wall contusion, unspecified laterality, initial encounter   Chest and abdominal contusion due to fall. Wrist abrasion and contusion.  Don't suspect fracture.  Call for x-ray order if increasing pain or swelling. Use heat, stretch and massage to the right neck/shoulder muscles. Consider using biofreeze or icy hot there, as well as to other painful locations. Continue tylenol as needed for pain. She reports being told NEVER to take NSAIDs, so avoid these if possible. If pain persists/worsens (ie 7-8/10 despite using these measures), call for tramadol prescription.    Use the flexeril at bedtime as needed for muscle spasm (neck/shoulders); this may help you sleep.  Don't use during the day if it causes sedation.

## 2015-02-24 NOTE — Patient Instructions (Signed)
  Chest and abdominal contusion due to fall. Left wrist abrasion and contusion.  I don't suspect fracture.  Call for x-ray order if increasing pain or swelling. Use heat, stretch and massage to the right neck/shoulder muscles. Consider using biofreeze or icy hot there, as well as to other painful locations. Continue tylenol as needed for pain. Since you were told NEVER to take NSAIDs, avoid these if possible. (if GI says it is okay, use Aleve twice daily only if needed for the next few days, a week at most). If pain persists/worsens (ie 7-8/10 despite using these measures), call for tramadol prescription.    Use the flexeril at bedtime as needed for muscle spasm (neck/shoulders); this may help you sleep.  Don't use during the day if it causes sedation.

## 2015-02-28 ENCOUNTER — Encounter: Payer: Self-pay | Admitting: Internal Medicine

## 2015-02-28 ENCOUNTER — Ambulatory Visit (INDEPENDENT_AMBULATORY_CARE_PROVIDER_SITE_OTHER): Payer: 59 | Admitting: Internal Medicine

## 2015-02-28 VITALS — BP 100/70 | HR 68 | Ht 64.0 in | Wt 174.6 lb

## 2015-02-28 DIAGNOSIS — K5000 Crohn's disease of small intestine without complications: Secondary | ICD-10-CM | POA: Diagnosis not present

## 2015-02-28 DIAGNOSIS — Z7960 Long term (current) use of unspecified immunomodulators and immunosuppressants: Secondary | ICD-10-CM

## 2015-02-28 DIAGNOSIS — Z79899 Other long term (current) drug therapy: Secondary | ICD-10-CM | POA: Diagnosis not present

## 2015-02-28 DIAGNOSIS — M8580 Other specified disorders of bone density and structure, unspecified site: Secondary | ICD-10-CM

## 2015-02-28 MED ORDER — DIPHENOXYLATE-ATROPINE 2.5-0.025 MG PO TABS: 1.0000 | ORAL_TABLET | Freq: Four times a day (QID) | ORAL | Status: DC | PRN

## 2015-02-28 NOTE — Patient Instructions (Addendum)
We have refilled your Lomotil today.  You have been made an appointment with [facility] for May 5th at 11:30AM.  She is in with Dr Allyn Kenner at [address] , [place], [place] [address].  Phone # [phone].   I appreciate the opportunity to care for you. Silvano Rusk, M.D., [facility]

## 2015-02-28 NOTE — Assessment & Plan Note (Signed)
PAP ok Labs ok Derm referral

## 2015-02-28 NOTE — Assessment & Plan Note (Signed)
dexa 09/2013

## 2015-02-28 NOTE — Assessment & Plan Note (Addendum)
Well overall Continue Cimzia and AZA As far as NSAIDS - non-selectives can trigger flare so try to avoid. Celecoxcib is preferred if needed. In my experience rare non-selective use probably ok so advised her about my opinion that rare intermittent non-selective NSAID use ok but if daily long-tern would use celecoxcib.

## 2015-02-28 NOTE — Progress Notes (Signed)
   Subjective:    Patient ID: Lindsay Mccarthy, female    DOB: May 02, 1976, 39 y.o.   MRN: [id number] Cc: f/u Crohn's dieaase HPI The patient is here - last seen > 1 yr ago. Doing well overall w/o problems except has some diarrhea when traveling so wanted Lomotil Rx to use then. Asking if ok to use ibuprofen at times, prior GI MD told her never to use. PCP has suggested she use that occasionally for aches, pains. Recently fell and had dx muscle spasm, soft tissue injury, contusions and NSAID recommended but patient remebered what prior GI MD told her. Flexeril Rx provided - this is from 4/25 PCP visit.  Medications, allergies, past medical history, past surgical history, family history and social history are reviewed and updated in the EMR.  Review of Systems As above, o/w ok    Objective:   Physical Exam @BP  100/70 mmHg  Pulse 68  Ht 5' 4"  (1.626 m)  Wt 174 lb 9.6 oz (79.198 kg)  BMI 29.96 kg/m2@  General:  NAD Eyes:   anicteric Lungs:  clear Heart:: S1S2 no rubs, murmurs or gallops Abdomen:  soft and nontender, BS+ Ext:   no edema, cyanosis or clubbing    Data Reviewed:  As per HPI Lab Results  Component Value Date   ALT 10 02/18/2015   AST 16 02/18/2015   ALKPHOS 53 02/18/2015   BILITOT 0.5 02/18/2015   Lab Results  Component Value Date   WBC 6.2 02/18/2015   HGB 12.8 02/18/2015   HCT 37.2 02/18/2015   MCV 97.7 02/18/2015   PLT 293.0 02/18/2015      Assessment & Plan:  Crohn's disease of small intestine Well overall Continue Cimzia and AZA As far as NSAIDS - non-selectives can trigger flare so try to avoid. Celecoxcib is preferred if needed. In my experience rare non-selective use probably ok so advised her about my opinion that rare intermittent non-selective NSAID use ok but if daily long-tern would use celecoxcib.   Long-term use of immunosuppressant medication-Azathioprine and Cimzia PAP ok Labs ok Derm referral    Osteopenia of femur neck T. score  -1.4 dexa 09/2013    RTC 1 year  BP:JPETK,KOE A, MD

## 2015-03-03 ENCOUNTER — Other Ambulatory Visit: Payer: Self-pay | Admitting: Internal Medicine

## 2015-03-30 ENCOUNTER — Other Ambulatory Visit: Payer: Self-pay | Admitting: Family Medicine

## 2015-04-14 ENCOUNTER — Encounter: Payer: Self-pay | Admitting: Family Medicine

## 2015-04-14 ENCOUNTER — Ambulatory Visit (INDEPENDENT_AMBULATORY_CARE_PROVIDER_SITE_OTHER): Payer: 59 | Admitting: Family Medicine

## 2015-04-14 VITALS — BP 120/74 | HR 84 | Ht 64.0 in | Wt 179.4 lb

## 2015-04-14 DIAGNOSIS — Z5189 Encounter for other specified aftercare: Secondary | ICD-10-CM | POA: Diagnosis not present

## 2015-04-14 DIAGNOSIS — F324 Major depressive disorder, single episode, in partial remission: Secondary | ICD-10-CM | POA: Diagnosis not present

## 2015-04-14 DIAGNOSIS — F325 Major depressive disorder, single episode, in full remission: Secondary | ICD-10-CM

## 2015-04-14 NOTE — Patient Instructions (Addendum)
  Use antibiotic ointment such as bacitracin to the wound on the foot at least 2-3 times/day until healed.  Return if increasing in size, pain, redness, swelling, drainage, fever for re-evaluation.  Continue the Lexapro and wellbutrin at the same dose.

## 2015-04-14 NOTE — Progress Notes (Signed)
Chief Complaint  Patient presents with  . Anxiety    6 month follow on Lexapro-doing fine.  . Follow-up    had benign mole taken off bottom of left foot, still oozing and bleeding from time to time and would like to know if you would take a look at it.    Depression follow-up: She presents for 6 month follow up on depression.  Lexapro was added at her last visit.  She had changed to SR wellbutrin 153m BID after seeing Wellbutrin XL 1566min her stool. It didn't seem to work quite as well as the original 15077mL that was adequately absorbed (not seen in stool). Dose was increased to 150m15mD (of the SR) end of January 2015 ,but she didn't notice any improvement. Lexparo 10mg21m added 6 months ago, and she is "feeling great". Prior to starting the Lexapro, complaints were: sometimes feels like she is in a fog, trouble concentrating, somewhat dissociated. She had also felt more anxious.  All of these symptoms have resolved.  She is back to the 100mg 25mbutrin dose, along with the Lexapro, and is very happy.  She denies side effects or problems on her current regimen.  "I feel like I did before I was ever on Cimzia", like "the old Trish". No longer having any anxiety or "fog".  She had 2 moles removed--one from her back that has completely healed.  They were unable to completely numb the foot for the one removed from the bottom of the left foot.  Because of this, she states the doctor couldn't cauterize it. She continues to have some oozing.  She has some soreness, feels bruised.  Denies redness, swelling, fevers. She had a large scab come off, and she thinks it is finally improving, but would like it checked.  PMH, PSH, SH reviewed.  Denies changes to FH   [facility]  Medication Sig Note  . azaTHIOprine (IMURAN) 50 MG tablet TAKE 3 TABLETS BY MOUTH DAILY   . buPROPion (WELLBUTRIN SR) 100 MG 12 hr tablet Take 1 tablet (100 mg total) by mouth 2 (two) times  daily.   . Certolizumab Pegol (CIMZIA PREFILLED) 2 X 200 MG/ML KIT Inject 200 mg into the skin every 14 (fourteen) days.   . cyanocobalamin (,VITAMIN B-12,) 1000 MCG/ML injection INJECT AS DIRECTED EVERY 30 DAYS   . diphenhydrAMINE (SOMINEX) 25 MG tablet Take 25 mg by mouth at bedtime as needed for sleep. 10/07/2014: Takes every night  . diphenoxylate-atropine (LOMOTIL) 2.5-0.025 MG per tablet Take 1 tablet by mouth 4 (four) times daily as needed for diarrhea or loose stools.   . esciMarland Kitchenalopram (LEXAPRO) 10 MG tablet Take 1 tablet (10 mg total) by mouth daily.   . fexofenadine (ALLEGRA) 180 MG tablet Take 180 mg by mouth daily.   . levoMarland Kitchenorgestrel (MIRENA) 20 MCG/24HR IUD 1 each by Intrauterine route once.   . Omega-3 Fatty Acids (FISH OIL) 1000 MG CAPS Take 2 capsules by mouth daily.   . acetMarland Kitchenminophen (TYLENOL) 500 MG tablet Take 1,000 mg by mouth every 6 (six) hours as needed. 04/14/2015: prn  . [DISCONTINUED] azaTHIOprine (IMURAN) 50 MG tablet TAKE 3 TABLETS BY MOUTH DAILY   . [DISCONTINUED] cyclobenzaprine (FLEXERIL) 10 MG tablet Take 0.5-1 tablets (5-10 mg total) by mouth 3 (three) times daily as needed for muscle spasms.    No facility-administered encounter medications on file as of 04/14/2015.   Allergies  Allergen Reactions  . Ibuprofen  Avoids due to Crohns disease  . Prednisone     "crazy", hallucinatory   ROS:  No fatigue, fever, chills, URI symptoms, weight gain, nausea, vomiting, bowel changes, chest pain, palpitations, insomnia, depression, anxiety, rashes or other concerns.  PHYSICAL EXAM: BP 120/74 mmHg  Pulse 84  Ht 5' 4"  (1.626 m)  Wt 179 lb 6.4 oz (81.375 kg)  BMI 30.78 kg/m2  Well developed, pleasant female in good spirits. Full range of affect, normal mood, hygiene, grooming, eye contact and speech.  Left lateral foot, posteriorly-- central scabbed area measuring 0.5cm, slightly light yellow. There is an area surrounding this that has peeled, leaving a circular  margin of peeling measuring about 2.2 cm in size.  This area is not red, warm, there is no induration, oozing, drainage, streaks.  ASSESSMENT/PLAN:  Depression, major, in remission - Doing well on current regimen.  Continue Lexapro, Wellbutrin  Encounter for wound re-check - healing, without evidence of infection   Use antibiotic ointment such as bacitracin to the wound on the foot at least 2-3 times/day until healed.  Return if increasing in size, pain, redness, swelling, drainage, fever for re-evaluation.

## 2015-05-09 ENCOUNTER — Other Ambulatory Visit: Payer: Self-pay | Admitting: Internal Medicine

## 2015-07-18 ENCOUNTER — Other Ambulatory Visit (INDEPENDENT_AMBULATORY_CARE_PROVIDER_SITE_OTHER): Payer: 59

## 2015-07-18 DIAGNOSIS — K5000 Crohn's disease of small intestine without complications: Secondary | ICD-10-CM | POA: Diagnosis not present

## 2015-07-18 LAB — CBC WITH DIFFERENTIAL/PLATELET
Basophils Absolute: 0 10*3/uL (ref 0.0–0.1)
Basophils Relative: 0.4 % (ref 0.0–3.0)
EOS PCT: 0.5 % (ref 0.0–5.0)
Eosinophils Absolute: 0 10*3/uL (ref 0.0–0.7)
HEMATOCRIT: 39.1 % (ref 36.0–46.0)
HEMOGLOBIN: 13.4 g/dL (ref 12.0–15.0)
Lymphocytes Relative: 15.7 % (ref 12.0–46.0)
Lymphs Abs: 1.4 10*3/uL (ref 0.7–4.0)
MCHC: 34.2 g/dL (ref 30.0–36.0)
MCV: 96.7 fl (ref 78.0–100.0)
MONOS PCT: 10.5 % (ref 3.0–12.0)
Monocytes Absolute: 1 10*3/uL (ref 0.1–1.0)
Neutro Abs: 6.7 10*3/uL (ref 1.4–7.7)
Neutrophils Relative %: 72.9 % (ref 43.0–77.0)
Platelets: 339 10*3/uL (ref 150.0–400.0)
RBC: 4.04 Mil/uL (ref 3.87–5.11)
RDW: 13.6 % (ref 11.5–15.5)
WBC: 9.2 10*3/uL (ref 4.0–10.5)

## 2015-07-18 LAB — HEPATIC FUNCTION PANEL
ALT: 12 U/L (ref 0–35)
AST: 16 U/L (ref 0–37)
Albumin: 3.4 g/dL — ABNORMAL LOW (ref 3.5–5.2)
Alkaline Phosphatase: 53 U/L (ref 39–117)
BILIRUBIN TOTAL: 0.4 mg/dL (ref 0.2–1.2)
Bilirubin, Direct: 0.1 mg/dL (ref 0.0–0.3)
TOTAL PROTEIN: 6.5 g/dL (ref 6.0–8.3)

## 2015-07-23 ENCOUNTER — Other Ambulatory Visit: Payer: Self-pay

## 2015-07-23 ENCOUNTER — Telehealth: Payer: Self-pay | Admitting: Internal Medicine

## 2015-07-23 DIAGNOSIS — K50119 Crohn's disease of large intestine with unspecified complications: Secondary | ICD-10-CM

## 2015-07-23 NOTE — Progress Notes (Signed)
Quick Note:  Labs ok Repeat same in jan 2017 ______

## 2015-07-23 NOTE — Telephone Encounter (Signed)
See results on labs

## 2015-07-24 ENCOUNTER — Encounter: Payer: Self-pay | Admitting: Family Medicine

## 2015-07-24 ENCOUNTER — Ambulatory Visit (INDEPENDENT_AMBULATORY_CARE_PROVIDER_SITE_OTHER): Payer: 59 | Admitting: Family Medicine

## 2015-07-24 VITALS — BP 120/80 | HR 60 | Temp 97.9°F | Wt 189.0 lb

## 2015-07-24 DIAGNOSIS — R1031 Right lower quadrant pain: Secondary | ICD-10-CM | POA: Diagnosis not present

## 2015-07-24 LAB — POCT URINALYSIS DIPSTICK
Bilirubin, UA: NEGATIVE
GLUCOSE UA: NEGATIVE
Ketones, UA: NEGATIVE
LEUKOCYTES UA: NEGATIVE
NITRITE UA: NEGATIVE
PH UA: 6
Protein, UA: NEGATIVE
RBC UA: NEGATIVE
Spec Grav, UA: >=1.03
UROBILINOGEN UA: NEGATIVE

## 2015-07-24 LAB — CBC WITH DIFFERENTIAL/PLATELET
BASOS ABS: 0 10*3/uL (ref 0.0–0.1)
BASOS PCT: 0 % (ref 0–1)
EOS PCT: 1 % (ref 0–5)
Eosinophils Absolute: 0.1 10*3/uL (ref 0.0–0.7)
HCT: 37.5 % (ref 36.0–46.0)
Hemoglobin: 13 g/dL (ref 12.0–15.0)
Lymphocytes Relative: 24 % (ref 12–46)
Lymphs Abs: 1.3 10*3/uL (ref 0.7–4.0)
MCH: 33.2 pg (ref 26.0–34.0)
MCHC: 34.7 g/dL (ref 30.0–36.0)
MCV: 95.7 fL (ref 78.0–100.0)
MONO ABS: 0.7 10*3/uL (ref 0.1–1.0)
MPV: 8.5 fL — AB (ref 8.6–12.4)
Monocytes Relative: 12 % (ref 3–12)
Neutro Abs: 3.5 10*3/uL (ref 1.7–7.7)
Neutrophils Relative %: 63 % (ref 43–77)
PLATELETS: 339 10*3/uL (ref 150–400)
RBC: 3.92 MIL/uL (ref 3.87–5.11)
RDW: 14.3 % (ref 11.5–15.5)
WBC: 5.6 10*3/uL (ref 4.0–10.5)

## 2015-07-24 LAB — POCT URINE PREGNANCY: PREG TEST UR: NEGATIVE

## 2015-07-24 NOTE — Patient Instructions (Signed)
Take 800 mg of ibuprofen 3 times per day or 2 Aleve twice per day and see if this helps with your symptoms. Let us know if you are not feeling any better and we will reevaluate. Let us know sooner if you develop fever or worsening symptoms.

## 2015-07-24 NOTE — Progress Notes (Signed)
   Subjective:    Patient ID: Lindsay Mccarthy, female    DOB: 1976-06-04, 39 y.o.   MRN: [id number]  HPI She is here for RLQ pain since this morning that feels like something is stabbing here on the inside and some mild spotting. Also complains of abdominal cramping and low back pain. She has not had a period in several years due to the [place]. She has had the mirena for 4 1/2 years. No changes to bowel movement but has a history of Crohn's disease. Denies fever, chills, dysuria, urinary frequency, hematuria. She has not taken anything for pain today. Reports history of cyst to ovaries.  No new sexual partners.  She has had an appendectomy.   Reviewed allergies, medications   Review of Systems Pertinent positives and negatives in the history of present illness.    Objective:   Physical Exam  Constitutional: She appears well-developed and well-nourished. No distress.  Abdominal: Soft. Bowel sounds are normal. She exhibits no mass. There is no hepatosplenomegaly. There is tenderness. There is no rigidity, no rebound, no guarding, no CVA tenderness, no tenderness at McBurney's point and negative Murphy's sign.    Genitourinary: Uterus normal. There is no rash, tenderness or lesion on the right labia. There is no rash, tenderness or lesion on the left labia. Left adnexum displays no mass, no tenderness and no fullness. No tenderness in the vagina. No vaginal discharge found.  Minimal blood noted in cervical os with IUD strings visible      Urine pregnancy negative Urinalysis dipstick negative    Assessment & Plan:  Right lower quadrant abdominal pain - Plan: POCT urinalysis dipstick, POCT urine pregnancy, CBC with Differential/Platelet  Discussed that basically we ruled out what it is not but do not necessarily have clear-cut idea of what is going on. Most likely this is due to her body trying to have a period. Reassured her that I was able to visualize the strings from her IUD as this  was a big concern for her today and that her urine pregnancy test was negative. Advised her to try taking NSAIDs for 2-3 days and see if this helps with her symptoms. She is due in approximately 6 months to have her current IUD removed and would like a new one inserted. We will follow-up with her regarding labs tomorrow. She will let me know if she develops worsening symptoms.

## 2015-09-02 DIAGNOSIS — B029 Zoster without complications: Secondary | ICD-10-CM

## 2015-09-02 HISTORY — DX: Zoster without complications: B02.9

## 2015-09-04 ENCOUNTER — Other Ambulatory Visit: Payer: Self-pay

## 2015-09-04 MED ORDER — CYANOCOBALAMIN 1000 MCG/ML IJ SOLN: INTRAMUSCULAR | Status: DC

## 2015-09-12 ENCOUNTER — Encounter: Payer: Self-pay | Admitting: Family Medicine

## 2015-09-12 ENCOUNTER — Ambulatory Visit (INDEPENDENT_AMBULATORY_CARE_PROVIDER_SITE_OTHER): Payer: 59 | Admitting: Family Medicine

## 2015-09-12 VITALS — BP 112/76 | HR 70 | Ht 64.0 in | Wt 205.0 lb

## 2015-09-12 DIAGNOSIS — B029 Zoster without complications: Secondary | ICD-10-CM | POA: Diagnosis not present

## 2015-09-12 MED ORDER — VALACYCLOVIR HCL 1 G PO TABS: 1000.0000 mg | ORAL_TABLET | Freq: Three times a day (TID) | ORAL | Status: DC

## 2015-09-12 NOTE — Patient Instructions (Signed)

## 2015-09-12 NOTE — Progress Notes (Signed)
   Subjective:    Patient ID: Lindsay Mccarthy, female    DOB: 03-Mar-1976, 39 y.o.   MRN: [id number]  HPI She noted difficulty with itching yesterday and rash on the right midabdominal area today. It is slightly hypersensitive.   Review of Systems     Objective:   Physical Exam An area of erythema and blistering is noted in the right T11 nerve root with sensitivity in that same pattern.       Assessment & Plan:  Shingles - Plan: valACYclovir (VALTREX) 1000 MG tablet  commend she use Advil for relief of pain. Also discussed avoiding pregnant women and young children. Also mentioned if the rash gets worse and is question of an infection, to call for reevaluation.

## 2015-09-23 ENCOUNTER — Other Ambulatory Visit: Payer: Self-pay | Admitting: Family Medicine

## 2015-09-23 NOTE — Telephone Encounter (Signed)
Scheduled for CPE in December; refilled x 30 days so she doesn't run out before then.

## 2015-09-23 NOTE — Telephone Encounter (Signed)
Dr.Knapp are these okay to refill

## 2015-09-30 ENCOUNTER — Encounter: Payer: Self-pay | Admitting: Internal Medicine

## 2015-09-30 ENCOUNTER — Ambulatory Visit (INDEPENDENT_AMBULATORY_CARE_PROVIDER_SITE_OTHER): Payer: 59 | Admitting: Internal Medicine

## 2015-09-30 VITALS — BP 116/70 | HR 92 | Ht 64.0 in | Wt 203.4 lb

## 2015-09-30 DIAGNOSIS — K5000 Crohn's disease of small intestine without complications: Secondary | ICD-10-CM | POA: Diagnosis not present

## 2015-09-30 DIAGNOSIS — R143 Flatulence: Secondary | ICD-10-CM | POA: Diagnosis not present

## 2015-09-30 DIAGNOSIS — IMO0001 Reserved for inherently not codable concepts without codable children: Secondary | ICD-10-CM

## 2015-09-30 DIAGNOSIS — Z79899 Other long term (current) drug therapy: Secondary | ICD-10-CM | POA: Diagnosis not present

## 2015-09-30 DIAGNOSIS — Z7960 Long term (current) use of unspecified immunomodulators and immunosuppressants: Secondary | ICD-10-CM

## 2015-09-30 NOTE — Assessment & Plan Note (Signed)
Labs ok Unable to take vaccine for zoster due to Cimzia + AZA

## 2015-09-30 NOTE — Progress Notes (Signed)
   Subjective:    Patient ID: Lindsay Mccarthy, female    DOB: 10/07/1976, 39 y.o.   MRN: [id number] Cc: follow-up Crohn's HPI Here for f/u No diarrhea or bleeding but having gas pains and cramps - bothersome. Has been off her Cimzia x 2-3 doses - forgot and then got shingles RLQ area so did not take. Remains on AZA. Does drink and eat milk products.  Medications, allergies, past medical history, past surgical history, family history and social history are reviewed and updated in the EMR.  Review of Systems As above - shingles resolved - got prompt Tx and a few blisters residual but no pain.    Objective:   Physical Exam @BP  116/70 mmHg  Pulse 92  Ht 5' 4"  (1.626 m)  Wt 203 lb 6 oz (92.25 kg)  BMI 34.89 kg/m2@  General:  NAD - obese Eyes:   anicteric Lungs:  clear Heart:: S1S2 no rubs, murmurs or gallops Abdomen:  soft and nontender, BS+ Ext:   no edema, cyanosis or clubbing Skin:  RLQ/flank ? L2 dermatome crusted/healing lesions   Data Reviewed:  PCP notes Prior colonoscopy and pathology notes    Assessment & Plan:  Crohn's disease of small intestine ? sxs vs lactose intoletrance vs SIBO  Long-term use of immunosuppressant medication-Azathioprine and Cimzia Labs ok Unable to take vaccine for zoster due to Cimzia + AZA  Gas   Plan to avoid lactose Consider empiric abx ? SIBO Colonoscopy if that not helping - reassess dz status Need to catch up on DEXA scan by next year (Osteopenia)

## 2015-09-30 NOTE — Patient Instructions (Signed)
   Please go lactose free for 2 weeks and let me know if that relieved the gas. Restart Cimzia   I appreciate the opportunity to care for you. Gatha Mayer, MD, [facility]

## 2015-09-30 NOTE — Assessment & Plan Note (Signed)
?   sxs vs lactose intoletrance vs SIBO

## 2015-10-01 ENCOUNTER — Encounter: Payer: Self-pay | Admitting: Internal Medicine

## 2015-10-02 ENCOUNTER — Other Ambulatory Visit: Payer: Self-pay | Admitting: Internal Medicine

## 2015-10-09 ENCOUNTER — Telehealth: Payer: Self-pay | Admitting: Family Medicine

## 2015-10-09 NOTE — Telephone Encounter (Signed)
Pt was seen back in November for shingles. She states it has mostly healed except for a few scabs. However she is not having pain again. Pt was offered an appt and stated that she can't come in. Her job is currently on black out days and she can't be off. She is requesting another round of medication she was given. Please advise pt at [phone].

## 2015-10-10 NOTE — Telephone Encounter (Signed)
Let her know that the treatment of choice at this point would be an anti-inflammatory like ibuprofen 800 mg 3 times per day. If this is not successful then she will need an appointment

## 2015-10-10 NOTE — Telephone Encounter (Signed)
LMTCB

## 2015-10-22 ENCOUNTER — Encounter: Payer: Self-pay | Admitting: Family Medicine

## 2015-11-10 ENCOUNTER — Other Ambulatory Visit: Payer: Self-pay | Admitting: Family Medicine

## 2015-11-10 NOTE — Telephone Encounter (Signed)
Put on cancellation list (due for CPE now). Refilled x 3 mos for now.

## 2015-11-10 NOTE — Telephone Encounter (Signed)
Are these okay to refill? Last seen 04/2015, was due for CPE 12/16-scheduled for CPE 04/2016, does she need med check prior to CPE?

## 2015-12-15 ENCOUNTER — Ambulatory Visit (INDEPENDENT_AMBULATORY_CARE_PROVIDER_SITE_OTHER): Payer: 59 | Admitting: Family Medicine

## 2015-12-15 ENCOUNTER — Encounter: Payer: Self-pay | Admitting: Family Medicine

## 2015-12-15 VITALS — BP 122/76 | HR 64 | Ht 64.0 in | Wt 198.0 lb

## 2015-12-15 DIAGNOSIS — K5000 Crohn's disease of small intestine without complications: Secondary | ICD-10-CM

## 2015-12-15 DIAGNOSIS — E538 Deficiency of other specified B group vitamins: Secondary | ICD-10-CM | POA: Diagnosis not present

## 2015-12-15 DIAGNOSIS — Z0000 Encounter for general adult medical examination without abnormal findings: Secondary | ICD-10-CM | POA: Diagnosis not present

## 2015-12-15 DIAGNOSIS — F325 Major depressive disorder, single episode, in full remission: Secondary | ICD-10-CM

## 2015-12-15 DIAGNOSIS — Z3049 Encounter for surveillance of other contraceptives: Secondary | ICD-10-CM

## 2015-12-15 DIAGNOSIS — Z5181 Encounter for therapeutic drug level monitoring: Secondary | ICD-10-CM | POA: Diagnosis not present

## 2015-12-15 DIAGNOSIS — M8580 Other specified disorders of bone density and structure, unspecified site: Secondary | ICD-10-CM

## 2015-12-15 DIAGNOSIS — Z23 Encounter for immunization: Secondary | ICD-10-CM | POA: Diagnosis not present

## 2015-12-15 DIAGNOSIS — E559 Vitamin D deficiency, unspecified: Secondary | ICD-10-CM | POA: Diagnosis not present

## 2015-12-15 DIAGNOSIS — E781 Pure hyperglyceridemia: Secondary | ICD-10-CM | POA: Diagnosis not present

## 2015-12-15 LAB — CBC WITH DIFFERENTIAL/PLATELET
BASOS ABS: 0 10*3/uL (ref 0.0–0.1)
BASOS PCT: 0 % (ref 0–1)
Eosinophils Absolute: 0.1 10*3/uL (ref 0.0–0.7)
Eosinophils Relative: 1 % (ref 0–5)
HEMATOCRIT: 40.3 % (ref 36.0–46.0)
HEMOGLOBIN: 13.3 g/dL (ref 12.0–15.0)
LYMPHS PCT: 16 % (ref 12–46)
Lymphs Abs: 1.1 10*3/uL (ref 0.7–4.0)
MCH: 32.3 pg (ref 26.0–34.0)
MCHC: 33 g/dL (ref 30.0–36.0)
MCV: 97.8 fL (ref 78.0–100.0)
MONO ABS: 0.8 10*3/uL (ref 0.1–1.0)
MPV: 9.2 fL (ref 8.6–12.4)
Monocytes Relative: 12 % (ref 3–12)
NEUTROS ABS: 4.8 10*3/uL (ref 1.7–7.7)
NEUTROS PCT: 71 % (ref 43–77)
Platelets: 318 10*3/uL (ref 150–400)
RBC: 4.12 MIL/uL (ref 3.87–5.11)
RDW: 14.8 % (ref 11.5–15.5)
WBC: 6.8 10*3/uL (ref 4.0–10.5)

## 2015-12-15 LAB — COMPREHENSIVE METABOLIC PANEL
ALBUMIN: 3.5 g/dL — AB (ref 3.6–5.1)
ALK PHOS: 55 U/L (ref 33–115)
ALT: 10 U/L (ref 6–29)
AST: 17 U/L (ref 10–30)
BILIRUBIN TOTAL: 0.4 mg/dL (ref 0.2–1.2)
BUN: 8 mg/dL (ref 7–25)
CALCIUM: 8.4 mg/dL — AB (ref 8.6–10.2)
CO2: 29 mmol/L (ref 20–31)
CREATININE: 0.81 mg/dL (ref 0.50–1.10)
Chloride: 104 mmol/L (ref 98–110)
Glucose, Bld: 75 mg/dL (ref 65–99)
Potassium: 4.1 mmol/L (ref 3.5–5.3)
SODIUM: 140 mmol/L (ref 135–146)
TOTAL PROTEIN: 6.4 g/dL (ref 6.1–8.1)

## 2015-12-15 LAB — LIPID PANEL
CHOLESTEROL: 173 mg/dL (ref 125–200)
HDL: 37 mg/dL — AB (ref >=46)
TRIGLYCERIDES: 535 mg/dL — AB (ref <150)
Total CHOL/HDL Ratio: 4.7 Ratio (ref <=5.0)

## 2015-12-15 LAB — POCT URINALYSIS DIPSTICK
BILIRUBIN UA: NEGATIVE
Glucose, UA: NEGATIVE
Ketones, UA: NEGATIVE
Leukocytes, UA: NEGATIVE
NITRITE UA: NEGATIVE
PH UA: 6
Protein, UA: NEGATIVE
Spec Grav, UA: 1.025
Urobilinogen, UA: NEGATIVE

## 2015-12-15 MED ORDER — ESCITALOPRAM OXALATE 10 MG PO TABS: 10.0000 mg | ORAL_TABLET | Freq: Every day | ORAL | Status: DC

## 2015-12-15 MED ORDER — BUPROPION HCL ER (SR) 100 MG PO TB12: 100.0000 mg | ORAL_TABLET | Freq: Two times a day (BID) | ORAL | Status: DC

## 2015-12-15 NOTE — Patient Instructions (Signed)
  HEALTH MAINTENANCE RECOMMENDATIONS:  It is recommended that you get at least 30 minutes of aerobic exercise at least 5 days/week (for weight loss, you may need as much as 60-90 minutes). This can be any activity that gets your heart rate up. This can be divided in 10-15 minute intervals if needed, but try and build up your endurance at least once a week.  Weight bearing exercise is also recommended twice weekly.  Eat a healthy diet with lots of vegetables, fruits and fiber.  "Colorful" foods have a lot of vitamins (ie green vegetables, tomatoes, red peppers, etc).  Limit sweet tea, regular sodas and alcoholic beverages, all of which has a lot of calories and sugar.  Up to 1 alcoholic drink daily may be beneficial for women (unless trying to lose weight, watch sugars).  Drink a lot of water.  Calcium recommendations are 1200-1500 mg daily (1500 mg for postmenopausal women or women without ovaries), and vitamin D 1000 IU daily.  This should be obtained from diet and/or supplements (vitamins), and calcium should not be taken all at once, but in divided doses.  Monthly self breast exams and yearly mammograms for women over the age of 40 is recommended.  Sunscreen of at least SPF 30 should be used on all sun-exposed parts of the skin when outside between the hours of 10 am and 4 pm (not just when at beach or pool, but even with exercise, golf, tennis, and yard work!)  Use a sunscreen that says "broad spectrum" so it covers both UVA and UVB rays, and make sure to reapply every 1-2 hours.  Remember to change the batteries in your smoke detectors when changing your clock times in the spring and fall.  Use your seat belt every time you are in a car, and please drive safely and not be distracted with cell phones and texting while driving.   I put a referral in the system to Dr. Hyman Bower they do not contact you, feel free to call them directly, or call our office and we can arrange for you.

## 2015-12-15 NOTE — Addendum Note (Signed)
Addended by: Carolee Rota F on: 12/15/2015 02:19 PM   Modules accepted: Orders

## 2015-12-15 NOTE — Progress Notes (Signed)
Chief Complaint  Patient presents with  . Annual Exam    fasting annual exam. Does not want to do pap today, needs to have Mirena taken out and replaced and would like to do pap at that time(needs name of  a GYN). No concerns. Would like flu shot today. Needs labs for Dr.Gessner, would like to have drawn today while here having labs for CPE.   Lindsay Mccarthy is a 40 y.o. female who presents for a complete physical.    Seen by Dr. Redmond School in November with shingles at her right hip. Completely resolved, with no long-term pain.  She previously had abnormal paps, HPV. Treated with LEEP in 2011. Had 2 normal paps in 2012, and normal pap here 10/2012, 10/2013, with no high risk HPV detected. Has Mirena IUD and is due to have it changed.. She has never been pregnant, and doesn't want children. She has been in a monogamous relationship x 9 years without concern for STD. Denies vaginal discharge, odor, itch.  IUD is due to be changed in May, per patient.  Depression follow-up: changed to SR wellbutrin 149m BID after seeing Wellbutrin XL 1528min her stool a while ago.She also continues on Lexapro, and moods are well controlled. She cut the dose of lexapro back earlier this year for 7 days due to concerns about possibly being too care-free (not getting upset by anything, especially related to stressors of building/selling a house); she noticed increased anxiety, with heart palpitations.  She restarted, and feels great again. Everyday stressors don't seem to bother her. She does feel she has full range of emotions in general, just the every day stressors.   Osteopenia: Last DEXA was 09/2013, showing T -1.6.  She would like to hold off on getting DEXA this year--doesn't think it will change things, and will make her more nervous/paranoid, knowing about the thinning of the bones.  She had problems with gas, which improved after switching from regular milk to AlSmith CenterShe continues to get some cheese in  her diet. She cut out yogurt and cow's milk.  Obesity:  She started back at [employer] in December, with starting weight of 207#  Immunization History  Administered Date(s) Administered  . Hepatitis A 06/29/2012, 01/04/2013  . Influenza Split 08/09/2012  . Influenza,inj,Quad PF,36+ Mos 09/18/2013, 10/07/2014  . PPD Test 04/30/2013, 10/07/2014  . Pneumococcal Polysaccharide-23 07/06/2012   Last Pap smear: 10/2013, normal, no high risk HPV detected Last mammogram: 03/2013 Last colonoscopy: 03/2009 Last DEXA: 09/2013 T-1.6 Ophtho: yearly Dentist: twice yearly  Exercise: getting 8K steps daily.  Getting 15 minutes at least 3x/week of resistance/weight training. Currently walking some and playing with the dog in the yard for cardio.  Not currently on treadmill.  Past Medical History  Diagnosis Date  . Asthma     Childhood  . Hyperlipemia   . Cervical dysplasia   . Crohn's disease of small intestine ([place]/24/2013    [place] Treated with Imuran Remicade and prednisone.  Noncompliant with therapy. 2004 return to care and was treated with Remicade prednisone Pentasa Cipro and Flagyl. 2006 status post right hemicolectomy. Subsequently treated with azathioprine and Cimzia. 200 mg every other week.  azathioprine was added in 2010. Prometheus TP MT enzyme was negative. Has ha  . Osteopenia of femur neck T. score -1.4 09/29/2011    09/2013 T-1.6  . Anemia     related to Crohns flares  . Seasonal allergic rhinitis   . History  of recurrent UTI (urinary tract infection)   . Eczema     arms and behind knees, worse in winter  . Wears glasses   . Scaphoid fracture of wrist 2013    left  . Arthritis     knees, feet, hands, wrists, related to Crohns flare  . HPV (human papilloma virus) infection   . Papanicolaou smear 12/12    last abnormal 2011  . Depression   . B12 deficiency     monitored/treated by Dr. Carlean Purl  . Shingles 09/2015    R hip     Past Surgical History  Procedure Laterality Date  . Leep  2011  . Colonoscopy  multiple    scanned  . Esophagogastroduodenoscopy  multiple    scanned  . Hemicolectomy  2006  . Appendectomy    . Wisdom tooth extraction      Social History   Social History  . Marital Status: Single    Spouse Name: N/A  . Number of Children: N/A  . Years of Education: N/A   Occupational History  . Quality Analyst    Social History Main Topics  . Smoking status: Former Smoker -- 1.00 packs/day for 4 years    Quit date: 11/18/1998  . Smokeless tobacco: Never Used  . Alcohol Use: 0.0 oz/week    0 Standard drinks or equivalent per week     Comment: rarely  . Drug Use: No  . Sexual Activity:    Partners: Male    Birth Control/ Protection: IUD     Comment: Mirena   Other Topics Concern  . Not on file   Social History Narrative   The patient is divorced.  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   Moved from [place] to [place] in 2013.   Past smoker   No alcohol   2 caffeinated beverages a day   She reports she is compliant with sunscreen given her increased risk of sun damage and skin cancer on azathioprine      Updated 12/15/15    Family History  Problem Relation Age of Onset  . Hypertension Mother   . Breast cancer Mother 80  . Colon polyps Father   . Diabetes Father     borderline  . Hypertension Father   . Colon cancer Paternal Grandfather   . Multiple sclerosis Sister   . Alcohol abuse Sister   . Stroke Maternal Grandmother   . Lung cancer Maternal Grandmother   . Heart disease Paternal Grandmother     Outpatient Encounter Prescriptions as of 12/15/2015  Medication Sig Note  . azaTHIOprine (IMURAN) 50 MG tablet TAKE 3 TABLETS BY MOUTH DAILY   . buPROPion (WELLBUTRIN SR) 100 MG 12 hr tablet TAKE 1 TABLET BY MOUTH TWICE A DAY   . Certolizumab Pegol (CIMZIA PREFILLED) 2 X 200  MG/ML KIT Inject 200 mg into the skin every 14 (fourteen) days.   . cyanocobalamin (,VITAMIN B-12,) 1000 MCG/ML injection INJECT AS DIRECTED EVERY 30 DAYS 12/15/2015: Self-administers  . diphenhydrAMINE (SOMINEX) 25 MG tablet Take 25 mg by mouth at bedtime as needed for sleep. 10/07/2014: Takes every night  . escitalopram (LEXAPRO) 10 MG tablet TAKE 1 TABLET BY MOUTH EVERY DAY   . fexofenadine (ALLEGRA) 180 MG tablet Take 180 mg by mouth daily.   Marland Kitchen levonorgestrel (MIRENA) 20 MCG/24HR IUD 1 each by Intrauterine route once.   Marland Kitchen  Omega-3 Fatty Acids (FISH OIL) 1000 MG CAPS Take 2 capsules by mouth daily.   Marland Kitchen acetaminophen (TYLENOL) 500 MG tablet Take 1,000 mg by mouth every 6 (six) hours as needed. Reported on 12/15/2015 04/14/2015: prn  . diphenoxylate-atropine (LOMOTIL) 2.5-0.025 MG per tablet Take 1 tablet by mouth 4 (four) times daily as needed for diarrhea or loose stools. (Patient not taking: Reported on 12/15/2015)   . [DISCONTINUED] azaTHIOprine (IMURAN) 50 MG tablet TAKE 3 TABLETS BY MOUTH DAILY   . [DISCONTINUED] valACYclovir (VALTREX) 1000 MG tablet Take 1,000 mg by mouth 3 (three) times daily. 12/15/2015: Received from: External Pharmacy   No facility-administered encounter medications on file as of 12/15/2015.  currently using Zyrtec, not Allegra, changes seasonally.   Allergies  Allergen Reactions  . Ibuprofen     Avoids due to Crohns disease  . Prednisone     "crazy", hallucinatory    ROS: The patient denies anorexia, fever, headaches, vision changes, decreased hearing, ear pain, sore throat, breast concerns, chest pain, palpitations, dizziness, syncope, dyspnea on exertion, cough, swelling, nausea, vomiting, indigestion/heartburn, hematuria, incontinence, dysuria, discharge, odor or itch, genital lesions, numbness, tingling, tremor, suspicious skin lesions, anxiety, abnormal bleeding/bruising (some bruising, "clumsy", unchanged), or enlarged lymph nodes. +weight loss since restarting  [employer] in December. Crohn's is very well controlled--denies abdominal pain, diarrhea, blood in stool. Chronic allergies, controlled by OTC meds. Switches between Allegra and Zyrtec (currently using zyrtec) Some pain in knees, hips, hands and wrists, mild. Rarely needs any medication, maybe an aleve on a very cold day to help with knee stiffness/pain. Mild URI symptoms x 3 days, slightly feverish yesterday, better today. Slight tickle in throat, nasal stuffiness.  No wheezing, shortness of breath. Some increased in vaginal spotting over the last 6 months (still very light, lasts 1 day, but for a long time had no spotting at all).  PHYSICAL EXAM:  BP 122/76 mmHg  Pulse 64  Ht 5' 4" (1.626 m)  Wt 198 lb (89.812 kg)  BMI 33.97 kg/m2  General Appearance:   Alert, cooperative, no distress, appears stated age  Head:   Normocephalic, without obvious abnormality, atraumatic  Eyes:   PERRL, conjunctiva/corneas clear, EOM's intact, fundi   benign  Ears:   Normal TM's and external ear canals  Nose:  Nares normal, mucosa mildly edematou, no drainage or sinus tenderness  Throat:  Lips, mucosa, and tongue normal; teeth and gums normal  Neck:  Supple, no lymphadenopathy; thyroid: no enlargement/tenderness/nodules; no carotid  bruit or JVD  Back:  Spine nontender, no curvature, ROM normal, no CVAtenderness.  Lungs:   Clear to auscultation bilaterally without wheezes, rales or ronchi; respirations unlabored  Chest Wall:   No tenderness or deformity  Heart:   Regular rate and rhythm, S1 and S2 normal, no murmur, rub  or gallop  Breast Exam:   Deferred to GYN  Abdomen:   Soft, non-tender, nondistended, normoactive bowel sounds,   no masses, no hepatosplenomegaly  Genitalia:  deferred to GYN  Rectal:   deferred  Extremities:  No clubbing, cyanosis or edema  Pulses:  2+ and symmetric all extremities  Skin:  Skin color,  texture, turgor normal, no rashes or lesions  Lymph nodes:  Cervical, supraclavicular, and axillary nodes normal  Neurologic:  CNII-XII intact, normal strength, sensation and gait; reflexes 2+ and symmetric throughout.      Psych: Normal mood, affect, hygiene and grooming        ASSESSMENT/PLAN:  Annual physical exam - Plan: Visual acuity  screening, Tdap vaccine greater than or equal to 7yo IM, CBC with Differential/Platelet, Comprehensive metabolic panel, Lipid panel, Vitamin B12, VITAMIN D 25 Hydroxy (Vit-D Deficiency, Fractures)  Need for Tdap vaccination - Plan: Tdap vaccine greater than or equal to 7yo IM  Need for prophylactic vaccination and inoculation against influenza - Plan: Flu Vaccine QUAD 36+ mos PF IM (Fluarix & Fluzone Quad PF)  Depression, major, in remission ([place]) - doing well on current regimen; some increased anxiety when tried to decrease lexapro dose - Plan: buPROPion (WELLBUTRIN SR) 100 MG 12 hr tablet, escitalopram (LEXAPRO) 10 MG tablet  Hypertriglyceridemia - Plan: Lipid panel  Crohn's disease of small intestine without complication ([place]) - controlled on current regimen, per Dr. Carlean Purl  Vitamin B12 deficiency - on monthly injections, last 2/1. - Plan: CBC with Differential/Platelet, Vitamin B12  Osteopenia - declines DEXA now; plan to do next year. Discussed calcium, Vitamin D and weight-bearing exercise - Plan: VITAMIN D 25 Hydroxy (Vit-D Deficiency, Fractures)  Encounter for surveillance of other contraceptive - Mirena IUD due to be changed 03/2016, per pt - Plan: Ambulatory referral to Gynecology  Medication monitoring encounter - Plan: CBC with Differential/Platelet, Comprehensive metabolic panel   CBC and LFT's in future orders from Dr. Wandra Feinstein requesting to be done today (and forwarded to him) Last B12 injection was 2/1.   Labs today: c-met, CBC, B12, Vit D, lipids Send copy to Dr. Carlean Purl  Discussed monthly  self breast exams and yearly mammograms after the age of 71 (she had baseline mammo at age 28); at least 30 minutes of aerobic activity at least 5 days/week including weight-bearing exercise at least twice per week; proper sunscreen use reviewed; healthy diet, including goals of calcium and vitamin D intake and alcohol recommendations (less than or equal to 1 drink/day) reviewed; regular seatbelt use; changing batteries in smoke detectors. Immunization recommendations discussed--Tdap and flu shot given today. Colonoscopy recommendations reviewed--per Dr. Carlean Purl.  Osteopenia--okay to wait until next year to screen again.  Not likely to have a significant enough decline over the 2 years to need treatment started at this time.  Discussed need for vitamin D, calcium, and weight bearing exercise.  Agrees to DEXA next year.  F/u 1 year, sooner prn

## 2015-12-16 DIAGNOSIS — E559 Vitamin D deficiency, unspecified: Secondary | ICD-10-CM | POA: Insufficient documentation

## 2015-12-16 HISTORY — DX: Vitamin D deficiency, unspecified: E55.9

## 2015-12-16 LAB — VITAMIN B12: Vitamin B-12: 394 pg/mL (ref 200–1100)

## 2015-12-16 LAB — VITAMIN D 25 HYDROXY (VIT D DEFICIENCY, FRACTURES): VIT D 25 HYDROXY: 12 ng/mL — AB (ref 30–100)

## 2015-12-16 MED ORDER — VITAMIN D (ERGOCALCIFEROL) 1.25 MG (50000 UNIT) PO CAPS: 50000.0000 [IU] | ORAL_CAPSULE | ORAL | Status: DC

## 2015-12-16 NOTE — Addendum Note (Signed)
Addended by: Rita Ohara on: 12/16/2015 06:45 AM   Modules accepted: Orders

## 2015-12-17 ENCOUNTER — Other Ambulatory Visit: Payer: Self-pay

## 2015-12-17 ENCOUNTER — Other Ambulatory Visit: Payer: Self-pay | Admitting: *Deleted

## 2015-12-17 DIAGNOSIS — Z7960 Long term (current) use of unspecified immunomodulators and immunosuppressants: Secondary | ICD-10-CM

## 2015-12-17 DIAGNOSIS — Z79899 Other long term (current) drug therapy: Secondary | ICD-10-CM

## 2015-12-17 DIAGNOSIS — E781 Pure hyperglyceridemia: Secondary | ICD-10-CM

## 2015-12-17 MED ORDER — FENOFIBRATE 54 MG PO TABS
54.0000 mg | ORAL_TABLET | Freq: Every day | ORAL | Status: DC
Start: 2015-12-17 — End: 2016-01-28

## 2015-12-17 NOTE — Progress Notes (Signed)
Quick Note:  Patient had labs at PCP LFT's and CBC ok She can repeat these in 4 months ______

## 2015-12-29 ENCOUNTER — Ambulatory Visit: Payer: 59 | Admitting: Obstetrics and Gynecology

## 2015-12-31 ENCOUNTER — Other Ambulatory Visit: Payer: Self-pay | Admitting: Internal Medicine

## 2016-01-12 ENCOUNTER — Encounter: Payer: Self-pay | Admitting: Obstetrics and Gynecology

## 2016-01-12 ENCOUNTER — Ambulatory Visit (INDEPENDENT_AMBULATORY_CARE_PROVIDER_SITE_OTHER): Payer: 59 | Admitting: Obstetrics and Gynecology

## 2016-01-12 VITALS — BP 110/74 | HR 76 | Resp 16 | Ht 64.0 in | Wt 198.4 lb

## 2016-01-12 DIAGNOSIS — Z01419 Encounter for gynecological examination (general) (routine) without abnormal findings: Secondary | ICD-10-CM

## 2016-01-12 DIAGNOSIS — Z30433 Encounter for removal and reinsertion of intrauterine contraceptive device: Secondary | ICD-10-CM

## 2016-01-12 DIAGNOSIS — N63 Unspecified lump in breast: Secondary | ICD-10-CM

## 2016-01-12 DIAGNOSIS — N6310 Unspecified lump in the right breast, unspecified quadrant: Secondary | ICD-10-CM

## 2016-01-12 MED ORDER — MISOPROSTOL 200 MCG PO TABS: ORAL_TABLET | ORAL | Status: DC

## 2016-01-12 NOTE — Patient Instructions (Signed)

## 2016-01-12 NOTE — Progress Notes (Signed)
Patient ID: Lindsay Mccarthy, female   DOB: Jun 25, 1976, 40 y.o.   MRN: [id number] 40 y.o. [id number] Single Caucasian female here for annual exam.    Patient is due for Mirena IUD removal and reinsertion in 03/2016. IUD insertion was painful. Declines childbearing.   Wants pap today.   Declines STD testing. Steady relationship for 10 years.   Crohn's disease is under control for 6 years.   Taking vit D supplementation.  Is a Teacher, early years/pre for [employer].  PCP: Rita Ohara, MD    No LMP recorded. Patient is not currently having periods (Reason: IUD).          Sexually active: Yes.   female The current method of family planning is IUD--Mirena inserted 03-08-11.   Exercising: Yes.    patient is trying to begin a routine. Smoker:  no  Health Maintenance: Pap:  10-03-13 Neg:Neg HR HPV History of abnormal Pap:  Yes,  Hx LEEP. MMG:  03-15-13 fibroglandular densities/Neg/BiRads1.  This was a baseline mammogram./screening at 42:[facility] in [place]. Colonoscopy:  2011 with Dr. Aurther Loft of Crohns disease BMD:   09-10-13  Result  Osteopenia with [facility].  TDaP:  12-15-15 Screening Labs:  Hb today: PCP, Urine today: unable to void   reports that she quit smoking about 17 years ago. She has never used smokeless tobacco. She reports that she does not drink alcohol or use illicit drugs.  Past Medical History  Diagnosis Date  . Asthma     Childhood  . Hyperlipemia   . Cervical dysplasia   . Crohn's disease of small intestine ([place]) 06/24/2012    Diagnosed 1999, in [place]. Ileitis only then. Treated with Imuran Remicade and prednisone.  Noncompliant with therapy. 2004 return to care and was treated with Remicade prednisone Pentasa Cipro and Flagyl. 2006 status post right hemicolectomy. Subsequently treated with azathioprine and Cimzia. 200 mg every other week.  azathioprine was added in 2010. Prometheus TP MT enzyme was negative. Has ha  . Osteopenia of femur neck T. score -1.4  09/29/2011    09/2013 T-1.6  . Anemia     related to Crohns flares  . Seasonal allergic rhinitis   . History of recurrent UTI (urinary tract infection)   . Eczema     arms and behind knees, worse in winter  . Wears glasses   . Scaphoid fracture of wrist 2013    left  . Arthritis     knees, feet, hands, wrists, related to Crohns flare  . HPV (human papilloma virus) infection   . Papanicolaou smear 12/12    last abnormal 2011  . Depression   . B12 deficiency     monitored/treated by Dr. Carlean Purl  . Shingles 09/2015    R hip  . Anxiety     Past Surgical History  Procedure Laterality Date  . Leep  2011    --done in [place]  . Colonoscopy  multiple    scanned  . Esophagogastroduodenoscopy  multiple    scanned  . Hemicolectomy  2006  . Appendectomy    . Wisdom tooth extraction    . Cervical biopsy  w/ loop electrode excision  2009    ---paps normal since    Current Outpatient Prescriptions  Medication Sig Dispense Refill  . acetaminophen (TYLENOL) 500 MG tablet Take 1,000 mg by mouth every 6 (six) hours as needed. Reported on 12/15/2015    . azaTHIOprine (IMURAN) 50 MG tablet TAKE 3 TABLETS BY MOUTH DAILY 90 tablet 2  .  buPROPion (WELLBUTRIN SR) 100 MG 12 hr tablet Take 1 tablet (100 mg total) by mouth 2 (two) times daily. 180 tablet 3  . Calcium-Vitamin D-Vitamin K (CALCIUM SOFT CHEWS) 500-100-40 MG-UNT-MCG CHEW Chew 1 tablet by mouth 2 (two) times daily.    . Certolizumab Pegol (CIMZIA PREFILLED) 2 X 200 MG/ML KIT Inject 200 mg into the skin every 14 (fourteen) days. 1 kit 6  . cyanocobalamin (,VITAMIN B-12,) 1000 MCG/ML injection INJECT AS DIRECTED EVERY 30 DAYS 3 mL 0  . diphenhydrAMINE (SOMINEX) 25 MG tablet Take 25 mg by mouth at bedtime as needed for sleep.    . diphenoxylate-atropine (LOMOTIL) 2.5-0.025 MG per tablet Take 1 tablet by mouth 4 (four) times daily as needed for diarrhea or loose stools. 120 tablet 0  . escitalopram (LEXAPRO) 10 MG tablet Take 1 tablet  (10 mg total) by mouth daily. 90 tablet 3  . fenofibrate 54 MG tablet Take 1 tablet (54 mg total) by mouth daily. 30 tablet 1  . fexofenadine (ALLEGRA) 180 MG tablet Take 180 mg by mouth daily.    Marland Kitchen levonorgestrel (MIRENA) 20 MCG/24HR IUD 1 each by Intrauterine route once.    . Omega-3 Fatty Acids (FISH OIL) 1000 MG CAPS Take 2 capsules by mouth daily.    . Vitamin D, Ergocalciferol, (DRISDOL) 50000 units CAPS capsule Take 1 capsule (50,000 Units total) by mouth every 7 (seven) days. 12 capsule 0   No current facility-administered medications for this visit.    Family History  Problem Relation Age of Onset  . Hypertension Mother   . Breast cancer Mother 79  . Colon polyps Father   . Diabetes Father     borderline  . Hypertension Father   . Hyperlipidemia Father   . Colon cancer Paternal Grandfather   . Multiple sclerosis Sister   . Alcohol abuse Sister   . Stroke Maternal Grandmother   . Lung cancer Maternal Grandmother   . Hypertension Maternal Grandmother   . Heart disease Paternal Grandmother   . Hypertension Paternal Grandmother     ROS:  Pertinent items are noted in HPI.  Otherwise, a comprehensive ROS was negative.  Exam:   BP 110/74 mmHg  Pulse 76  Resp 16  Ht 5' 4"  (1.626 m)  Wt 198 lb 6.4 oz (89.994 kg)  BMI 34.04 kg/m2  LMP     General appearance: alert, cooperative and appears stated age Head: Normocephalic, without obvious abnormality, atraumatic Neck: no adenopathy, supple, symmetrical, trachea midline and thyroid normal to inspection and palpation Lungs: clear to auscultation bilaterally Breasts: normal appearance, no masses or tenderness, Inspection negative, No nipple retraction or dimpling, No nipple discharge or bleeding, No axillary or supraclavicular adenopathy.  Right breast with ridge superiorly at 10 - 11:00  Heart: regular rate and rhythm Abdomen: vertical midline incision, soft, non-tender;  no masses, no organomegaly Extremities: extremities  normal, atraumatic, no cyanosis or edema Skin: Skin color, texture, turgor normal. No rashes or lesions Lymph nodes: Cervical, supraclavicular, and axillary nodes normal. No abnormal inguinal nodes palpated Neurologic: Grossly normal  Pelvic: External genitalia:  no lesions              Urethra:  normal appearing urethra with no masses, tenderness or lesions              Bartholins and Skenes: normal                 Vagina: normal appearing vagina with normal color and discharge, no lesions  Cervix: no lesions and IUD strings seen and minimal bloody mucous.              Pap taken: Yes.   Bimanual Exam:  Uterus:  normal size, contour, position, consistency, mobility, non-tender  Exam limited by [facility].              Adnexa: normal adnexa and no mass, fullness, tenderness            Chaperone was present for exam.  Assessment:   Well woman visit with normal exam. Mirena IUD patient.  Hx LEEP.  Osteopenia.  Right breast mass.  I feel this as a ridge. FH breast cancer.  Crohn's.  Plan: Yearly mammogram recommended after age 65.  Will schedule dx bilateral mammogram and right breast U/S. Recommended self breast exam.  Pap and HR HPV as above. Discussed Calcium, Vitamin D, regular exercise program including cardiovascular and weight bearing exercise. Labs performed.  No..    Refills given on medications.  No..    Patient will return for IUD exchange.  Will plan for Cytotec and then paracervical block.  Follow up annually and prn.      After visit summary provided.

## 2016-01-12 NOTE — Progress Notes (Signed)
Patient scheduled while in office for Bilateral Breast Diagnostic Imaging and R breast Ultrasound at [facility] for 01/26/16 at 1430

## 2016-01-15 LAB — IPS PAP TEST WITH HPV

## 2016-01-19 ENCOUNTER — Encounter: Payer: Self-pay | Admitting: Obstetrics and Gynecology

## 2016-01-20 ENCOUNTER — Telehealth: Payer: Self-pay | Admitting: Emergency Medicine

## 2016-01-20 NOTE — Telephone Encounter (Signed)
Called patient to review benefits for a recommended procedure. Left Voicemail on home number requesting a call back. I have also tried patients cell phone number and received a message " the subscriber you have dialed is not in service"

## 2016-01-20 NOTE — Telephone Encounter (Signed)
Chief Complaint  Patient presents with  . Appointment    Patient sent mychart message regarding IUD   . Advice Only    ===View-only below this line===   ----- Message -----    FromWallis Bamberg    Sent: 01/19/2016  7:37 PM EDT      To: Arloa Koh, MD Subject: Non-Urgent Medical Question  It was my understanding that your office was completeing a pre approval for me to have my mirena removed and replaced.  I still have not recieved a call confirming it or to schedule the procedure.

## 2016-01-20 NOTE — Telephone Encounter (Signed)
Telephone call for triage created to discuss message with patient and disposition as appropriate.   

## 2016-01-20 NOTE — Telephone Encounter (Signed)
Routing to [place] for pre-authorization and patient contact. Patient will need to speak with nurse to schedule please. Thank you!

## 2016-01-21 NOTE — Telephone Encounter (Signed)
Spoke with patient. Reviewed benefit for iud removal/reinsertion. Patient agreeable and ready to schedule. Requests appointment in April. Patient scheduled for 02/12/2016 at 3pm with Dr Quincy Simmonds. Patient aware and agreeable to arrival time. Patient aware and agreeable to 72 hour cancellation policy with $782 fee. No further questions. Ok to close.

## 2016-01-26 ENCOUNTER — Other Ambulatory Visit: Payer: 59

## 2016-01-26 DIAGNOSIS — E781 Pure hyperglyceridemia: Secondary | ICD-10-CM

## 2016-01-26 LAB — LIPID PANEL
Cholesterol: 148 mg/dL (ref 125–200)
HDL: 51 mg/dL (ref >=46)
LDL CALC: 54 mg/dL (ref <130)
TRIGLYCERIDES: 215 mg/dL — AB (ref <150)
Total CHOL/HDL Ratio: 2.9 Ratio (ref <=5.0)
VLDL: 43 mg/dL — ABNORMAL HIGH (ref <30)

## 2016-01-27 ENCOUNTER — Ambulatory Visit
Admission: RE | Admit: 2016-01-27 | Discharge: 2016-01-27 | Disposition: A | Discharge status: Home / Self Care | Payer: 59 | Source: Ambulatory Visit | Attending: Obstetrics and Gynecology | Admitting: Obstetrics and Gynecology

## 2016-01-27 DIAGNOSIS — N6310 Unspecified lump in the right breast, unspecified quadrant: Secondary | ICD-10-CM

## 2016-01-28 ENCOUNTER — Other Ambulatory Visit: Payer: Self-pay | Admitting: Internal Medicine

## 2016-01-28 ENCOUNTER — Other Ambulatory Visit: Payer: Self-pay | Admitting: *Deleted

## 2016-01-28 DIAGNOSIS — E781 Pure hyperglyceridemia: Secondary | ICD-10-CM

## 2016-01-28 MED ORDER — FENOFIBRATE 145 MG PO TABS: 145.0000 mg | ORAL_TABLET | Freq: Every day | ORAL | Status: DC

## 2016-01-29 NOTE — Telephone Encounter (Signed)
Ok to refill Sir?

## 2016-01-29 NOTE — Telephone Encounter (Signed)
rf x 12

## 2016-02-10 ENCOUNTER — Other Ambulatory Visit: Payer: Self-pay | Admitting: Family Medicine

## 2016-02-12 ENCOUNTER — Encounter: Payer: Self-pay | Admitting: Obstetrics and Gynecology

## 2016-02-12 ENCOUNTER — Ambulatory Visit (INDEPENDENT_AMBULATORY_CARE_PROVIDER_SITE_OTHER): Payer: 59 | Admitting: Obstetrics and Gynecology

## 2016-02-12 VITALS — BP 120/76 | HR 80 | Ht 64.0 in | Wt 203.2 lb

## 2016-02-12 DIAGNOSIS — N63 Unspecified lump in breast: Secondary | ICD-10-CM

## 2016-02-12 DIAGNOSIS — Z30433 Encounter for removal and reinsertion of intrauterine contraceptive device: Secondary | ICD-10-CM

## 2016-02-12 DIAGNOSIS — N6310 Unspecified lump in the right breast, unspecified quadrant: Secondary | ICD-10-CM

## 2016-02-12 NOTE — Progress Notes (Signed)
Patient ID: Lindsay Mccarthy, female   DOB: 1976/10/06, 40 y.o.   MRN: [id number] GYNECOLOGY  VISIT   HPI: 40 y.o.   Single  Caucasian  female   G0P0000 with No LMP recorded. Patient is not currently having periods (Reason: IUD).   here for Mirena removal and reinsertion.    IUD due for removal in May 2017.   Had prior painful insertion of Mirena IUD.   Took Cytotec last hs and this am.  Took Motrin 800 mg this afternoon.   Here also for a rigth breast check.  Has a ridge noted at 10:00 on physical exam.  Normal dx mammogram and ultrasound.   FH of breast cancer in mother.   GYNECOLOGIC HISTORY: No LMP recorded. Patient is not currently having periods (Reason: IUD). Contraception:  Mirena IUD Menopausal hormone therapy:  n/a Last mammogram: 03-15-13 fibroglandular densities/Neg/BiRads1. This was a baseline mammogram./screening at 40:[facility] in [place].  Last pap smear:  01-13-16 Neg:Neg HR HPV         OB History    Gravida Para Term Preterm AB TAB SAB Ectopic Multiple Living   0 0 0 0 0 0 0 0 0 0          Patient Active Problem List   Diagnosis Date Noted  . Vitamin D deficiency 12/16/2015  . Depression, major, in remission ([place]) 10/03/2013  . Hypertriglyceridemia 07/03/2012  . Crohn's disease of small intestine ([place]) 06/24/2012  . Long-term use of immunosuppressant medication-Azathioprine and Cimzia 06/24/2012  . Vitamin B12 deficiency 06/19/2012  . Osteopenia of femur neck T. score -1.4 09/29/2011    Past Medical History  Diagnosis Date  . Asthma     Childhood  . Hyperlipemia   . Cervical dysplasia   . Crohn's disease of small intestine ([place]) 06/24/2012    Diagnosed 1999, in [place]. Ileitis only then. Treated with Imuran Remicade and prednisone.  Noncompliant with therapy. 2004 return to care and was treated with Remicade prednisone Pentasa Cipro and Flagyl. 2006 status post right hemicolectomy. Subsequently treated with azathioprine and Cimzia. 200 mg every other  week.  azathioprine was added in 2010. Prometheus TP MT enzyme was negative. Has ha  . Osteopenia of femur neck T. score -1.4 09/29/2011    09/2013 T-1.6  . Anemia     related to Crohns flares  . Seasonal allergic rhinitis   . History of recurrent UTI (urinary tract infection)   . Eczema     arms and behind knees, worse in winter  . Wears glasses   . Scaphoid fracture of wrist 2013    left  . Arthritis     knees, feet, hands, wrists, related to Crohns flare  . HPV (human papilloma virus) infection   . Papanicolaou smear 12/12    last abnormal 2011  . Depression   . B12 deficiency     monitored/treated by Dr. Carlean Purl  . Shingles 09/2015    R hip  . Anxiety     Past Surgical History  Procedure Laterality Date  . Leep  2011    --done in [place]  . Colonoscopy  multiple    scanned  . Esophagogastroduodenoscopy  multiple    scanned  . Hemicolectomy  2006  . Appendectomy    . Wisdom tooth extraction    . Cervical biopsy  w/ loop electrode excision  2009    ---paps normal since    Current Outpatient Prescriptions  Medication Sig Dispense Refill  . acetaminophen (TYLENOL) 500 MG tablet Take  1,000 mg by mouth every 6 (six) hours as needed. Reported on 12/15/2015    . azaTHIOprine (IMURAN) 50 MG tablet TAKE 3 TABLETS BY MOUTH DAILY 90 tablet 2  . buPROPion (WELLBUTRIN SR) 100 MG 12 hr tablet Take 1 tablet (100 mg total) by mouth 2 (two) times daily. 180 tablet 3  . Calcium-Vitamin D-Vitamin K (CALCIUM SOFT CHEWS) 500-100-40 MG-UNT-MCG CHEW Chew 1 tablet by mouth 2 (two) times daily.    . Certolizumab Pegol (CIMZIA PREFILLED) 2 X 200 MG/ML KIT Inject 200 mg into the skin every 14 (fourteen) days. 1 kit 6  . cyanocobalamin (,VITAMIN B-12,) 1000 MCG/ML injection INJECT AS DIRECTED EVERY 30 DAYS 3 mL 11  . diphenhydrAMINE (SOMINEX) 25 MG tablet Take 25 mg by mouth at bedtime as needed for sleep.    . diphenoxylate-atropine (LOMOTIL) 2.5-0.025 MG per tablet Take 1 tablet by mouth 4  (four) times daily as needed for diarrhea or loose stools. 120 tablet 0  . escitalopram (LEXAPRO) 10 MG tablet Take 1 tablet (10 mg total) by mouth daily. 90 tablet 3  . fenofibrate (TRICOR) 145 MG tablet Take 1 tablet (145 mg total) by mouth daily. 30 tablet 1  . fexofenadine (ALLEGRA) 180 MG tablet Take 180 mg by mouth daily.    Marland Kitchen levonorgestrel (MIRENA) 20 MCG/24HR IUD 1 each by Intrauterine route once.    . misoprostol (CYTOTEC) 200 MCG tablet Place one tablet (200 mcg) in the vagina the night before and then one tablet (200 mcg) in the vagina the morning before the IUD insertion. 2 tablet 0  . Omega-3 Fatty Acids (FISH OIL) 1000 MG CAPS Take 2 capsules by mouth daily.    . Vitamin D, Ergocalciferol, (DRISDOL) 50000 units CAPS capsule Take 1 capsule (50,000 Units total) by mouth every 7 (seven) days. 12 capsule 0   No current facility-administered medications for this visit.     ALLERGIES: Ibuprofen and Prednisone  Family History  Problem Relation Age of Onset  . Hypertension Mother   . Breast cancer Mother 72  . Colon polyps Father   . Diabetes Father     borderline  . Hypertension Father   . Hyperlipidemia Father   . Colon cancer Paternal Grandfather   . Multiple sclerosis Sister   . Alcohol abuse Sister   . Stroke Maternal Grandmother   . Lung cancer Maternal Grandmother   . Hypertension Maternal Grandmother   . Heart disease Paternal Grandmother   . Hypertension Paternal Grandmother     Social History   Social History  . Marital Status: Single    Spouse Name: N/A  . Number of Children: N/A  . Years of Education: N/A   Occupational History  . Quality Analyst    Social History Main Topics  . Smoking status: Former Smoker -- 1.00 packs/day for 4 years    Quit date: 11/18/1998  . Smokeless tobacco: Never Used  . Alcohol Use: No  . Drug Use: No  . Sexual Activity:    Partners: Male    Birth Control/ Protection: IUD     Comment: Mirena inserted 03-08-11   Other  Topics Concern  . Not on file   Social History Narrative   The patient is divorced.  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   Past smoker   No alcohol  2 caffeinated beverages a day   She reports she is compliant with sunscreen given her increased risk of sun damage and skin cancer on azathioprine      Updated 12/15/15    ROS:  Pertinent items are noted in HPI.  PHYSICAL EXAMINATION:    BP 120/76 mmHg  Pulse 80  Ht 5' 4"  (1.626 m)  Wt 203 lb 3.2 oz (92.171 kg)  BMI 34.86 kg/m2    General appearance: alert, cooperative and appears stated age  Breast exam:  Right breast with ridge at 10:00.  No change from previous exam.  No nodes palpable, skin retractions or nipple discharge. Left breast no dominant masses, retractions, nipple discharge or axillary adenopathy.   Pelvic: External genitalia:  no lesions              Urethra:  normal appearing urethra with no masses, tenderness or lesions              Bartholins and Skenes: normal                 Vagina: normal appearing vagina with normal color and discharge, no lesions              Cervix: no lesions and IUD strings seen.          Bimanual Exam:  Uterus:  normal size, contour, position, consistency, mobility, non-tender              Adnexa: normal adnexa and no mass, fullness, tenderness       Mirena IUD removal and placement of new Mirena IUD.   Mirena IUD - lot number [id number], exp. 09/19 Consent for procedure. Speculum placed in vagina. Sterile prep with Hibiclens. IUD removed with rings forceps - intact and discarded.  Paracervical block 10 cc 1% lidocaine - lot number [id number], exp. 12/30/16. Uterus sounded to almost 7 cm.  Mirena IUD placed without difficulty.  Strings trimmed.  No complications.  Minimal EBL.   Chaperone was present for exam.  ASSESSMENT  Right breast mass.   Negative mammogram and ultrasound.  FH of breast cancer.  Mirena IUD removal and placement of new Mirena IUD.   PLAN  Discussion of breast masses and evaluation process including potential biopsy if appropriate. Patient declines referral to general surgery.  Mirena instructions and precautions given. Follow up in 5 weeks for IUD check and breast recheck then.    An After Visit Summary was printed and given to the patient.  ___15____ minutes face to face time of which over 50% was spent in counseling.

## 2016-02-12 NOTE — Patient Instructions (Signed)

## 2016-02-23 ENCOUNTER — Telehealth: Payer: Self-pay | Admitting: Emergency Medicine

## 2016-02-23 NOTE — Telephone Encounter (Signed)
-----   Message from Nunzio Cobbs, MD sent at 02/20/2016  1:48 PM EDT ----- Regarding: RE: Mammogram hold  Ok to remove from mammogram hold.   Brook ----- Message -----    From: Michele Mcalpine, RN    Sent: 02/20/2016  11:12 AM      To: Brook Oletta Lamas, MD Subject: Mammogram hold                                 Dr. Quincy Simmonds,  Please advise if okay to remove from mammogram hold.  Patient had follow up breast check with you 02/12/16.

## 2016-02-23 NOTE — Telephone Encounter (Signed)
Out of hold per Dr. Quincy Simmonds

## 2016-03-08 ENCOUNTER — Other Ambulatory Visit: Payer: Self-pay | Admitting: Family Medicine

## 2016-03-18 ENCOUNTER — Ambulatory Visit (INDEPENDENT_AMBULATORY_CARE_PROVIDER_SITE_OTHER): Payer: 59 | Admitting: Obstetrics and Gynecology

## 2016-03-18 ENCOUNTER — Encounter: Payer: Self-pay | Admitting: Obstetrics and Gynecology

## 2016-03-18 VITALS — BP 100/70 | HR 88 | Resp 16 | Ht 63.5 in | Wt 201.0 lb

## 2016-03-18 DIAGNOSIS — N63 Unspecified lump in unspecified breast: Secondary | ICD-10-CM

## 2016-03-18 DIAGNOSIS — Z30431 Encounter for routine checking of intrauterine contraceptive device: Secondary | ICD-10-CM

## 2016-03-18 NOTE — Progress Notes (Signed)
Patient ID: Lindsay Mccarthy, female   DOB: October 01, 1976, 40 y.o.   MRN: [id number] GYNECOLOGY  VISIT   HPI: 40 y.o.   Single  Caucasian  female   G0P0000 with No LMP recorded. Patient is not currently having periods (Reason: IUD).   here for follow up after IUD insertion and breast recheck.  Had Mirena IUD exchange at last visit.  No vaginal bleeding.  No pain.  Has a day each month of loss of control of urine followed by internal itching for 3 - 4 days as long as she has had a Mirena IUD. Does not need tx for this. Has had evaluation in the past and this was negative for vaginitis.  Loss of urine occurs spontaneously on these occasions. Leaks with sound of water.  Does not usually have urgency or frequency.   Denies dyspareunia.   Here also for a right breast check. Has a ridge noted at 10:00 on physical exam.  Normal dx mammogram and ultrasound.   FH of breast cancer in mother.   GYNECOLOGIC HISTORY: No LMP recorded. Patient is not currently having periods (Reason: IUD). Contraception:  Mirena IUD inserted 02-12-16 Menopausal hormone therapy:  n/a Last mammogram:  02-03-16 Diag.Bil.and Rt. [place] Density B/No sonographic or mammographic evidence of malignancy/BiRads1/Neg:[facility]--screening age 54 Last pap smear:   01-13-16 Neg:Neg HR HPV        OB History    Gravida Para Term Preterm AB TAB SAB Ectopic Multiple Living   0 0 0 0 0 0 0 0 0 0          Patient Active Problem List   Diagnosis Date Noted  . Vitamin D deficiency 12/16/2015  . Depression, major, in remission ([place]) 10/03/2013  . Hypertriglyceridemia 07/03/2012  . Crohn's disease of small intestine ([place]) 06/24/2012  . Long-term use of immunosuppressant medication-Azathioprine and Cimzia 06/24/2012  . Vitamin B12 deficiency 06/19/2012  . Osteopenia of femur neck T. score -1.4 09/29/2011    Past Medical History  Diagnosis Date  . Asthma     Childhood  . Hyperlipemia   . Cervical dysplasia   . Crohn's  disease of small intestine ([place]) 06/24/2012    Diagnosed 1999, in [place]. Ileitis only then. Treated with Imuran Remicade and prednisone.  Noncompliant with therapy. 2004 return to care and was treated with Remicade prednisone Pentasa Cipro and Flagyl. 2006 status post right hemicolectomy. Subsequently treated with azathioprine and Cimzia. 200 mg every other week.  azathioprine was added in 2010. Prometheus TP MT enzyme was negative. Has ha  . Osteopenia of femur neck T. score -1.4 09/29/2011    09/2013 T-1.6  . Anemia     related to Crohns flares  . Seasonal allergic rhinitis   . History of recurrent UTI (urinary tract infection)   . Eczema     arms and behind knees, worse in winter  . Wears glasses   . Scaphoid fracture of wrist 2013    left  . Arthritis     knees, feet, hands, wrists, related to Crohns flare  . HPV (human papilloma virus) infection   . Papanicolaou smear 12/12    last abnormal 2011  . Depression   . B12 deficiency     monitored/treated by Dr. Carlean Purl  . Shingles 09/2015    R hip  . Anxiety     Past Surgical History  Procedure Laterality Date  . Leep  2011    --done in [place]  . Colonoscopy  multiple  scanned  . Esophagogastroduodenoscopy  multiple    scanned  . Hemicolectomy  2006  . Appendectomy    . Wisdom tooth extraction    . Cervical biopsy  w/ loop electrode excision  2009    ---paps normal since    Current Outpatient Prescriptions  Medication Sig Dispense Refill  . acetaminophen (TYLENOL) 500 MG tablet Take 1,000 mg by mouth every 6 (six) hours as needed. Reported on 12/15/2015    . azaTHIOprine (IMURAN) 50 MG tablet TAKE 3 TABLETS BY MOUTH DAILY 90 tablet 2  . buPROPion (WELLBUTRIN SR) 100 MG 12 hr tablet Take 1 tablet (100 mg total) by mouth 2 (two) times daily. 180 tablet 3  . Calcium-Vitamin D-Vitamin K (CALCIUM SOFT CHEWS) 500-100-40 MG-UNT-MCG CHEW Chew 1 tablet by mouth 2 (two) times daily.    . Certolizumab Pegol (CIMZIA  PREFILLED) 2 X 200 MG/ML KIT Inject 200 mg into the skin every 14 (fourteen) days. 1 kit 6  . cyanocobalamin (,VITAMIN B-12,) 1000 MCG/ML injection INJECT AS DIRECTED EVERY 30 DAYS 3 mL 11  . diphenhydrAMINE (SOMINEX) 25 MG tablet Take 25 mg by mouth at bedtime as needed for sleep.    . diphenoxylate-atropine (LOMOTIL) 2.5-0.025 MG per tablet Take 1 tablet by mouth 4 (four) times daily as needed for diarrhea or loose stools. 120 tablet 0  . escitalopram (LEXAPRO) 10 MG tablet Take 1 tablet (10 mg total) by mouth daily. 90 tablet 3  . fenofibrate (TRICOR) 145 MG tablet Take 1 tablet (145 mg total) by mouth daily. 30 tablet 1  . fexofenadine (ALLEGRA) 180 MG tablet Take 180 mg by mouth daily.    Marland Kitchen levonorgestrel (MIRENA) 20 MCG/24HR IUD 1 each by Intrauterine route once.    . Omega-3 Fatty Acids (FISH OIL) 1000 MG CAPS Take 2 capsules by mouth daily.     No current facility-administered medications for this visit.     ALLERGIES: Ibuprofen and Prednisone  Family History  Problem Relation Age of Onset  . Hypertension Mother   . Breast cancer Mother 27  . Colon polyps Father   . Diabetes Father     borderline  . Hypertension Father   . Hyperlipidemia Father   . Colon cancer Paternal Grandfather   . Multiple sclerosis Sister   . Alcohol abuse Sister   . Stroke Maternal Grandmother   . Lung cancer Maternal Grandmother   . Hypertension Maternal Grandmother   . Heart disease Paternal Grandmother   . Hypertension Paternal Grandmother     Social History   Social History  . Marital Status: Single    Spouse Name: N/A  . Number of Children: N/A  . Years of Education: N/A   Occupational History  . Quality Analyst    Social History Main Topics  . Smoking status: Former Smoker -- 1.00 packs/day for 4 years    Quit date: 11/18/1998  . Smokeless tobacco: Never Used  . Alcohol Use: No  . Drug Use: No  . Sexual Activity:    Partners: Male    Birth Control/ Protection: IUD      Comment: Mirena inserted 03-08-11   Other Topics Concern  . Not on file   Social History Narrative   The patient is divorced.  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   Past smoker   No alcohol  2 caffeinated beverages a day   She reports she is compliant with sunscreen given her increased risk of sun damage and skin cancer on azathioprine      Updated 12/15/15    ROS:  Pertinent items are noted in HPI.  PHYSICAL EXAMINATION:    BP 100/70 mmHg  Pulse 88  Resp 16  Ht 5' 3.5" (1.613 m)  Wt 201 lb (91.173 kg)  BMI 35.04 kg/m2    General appearance: alert, cooperative and appears stated age   Breasts: normal appearance, no masses or tenderness, Inspection negative, No nipple retraction or dimpling, No nipple discharge or bleeding, No axillary or supraclavicular adenopathy on left.  Right breast with 4 mm wide and 2 cm long ridge at the 10:00 position of the left breast.  Nontender.  No axillary nodes.  No nipple discharge.     Pelvic: External genitalia:  no lesions              Urethra:  normal appearing urethra with no masses, tenderness or lesions              Bartholins and Skenes: normal                 Vagina: normal appearing vagina with normal color and discharge, no lesions              Cervix: no lesions and IUD strings seen.               Bimanual Exam:  Uterus:  normal size, contour, position, consistency, mobility, non-tender              Adnexa: normal adnexa and no mass, fullness, tenderness       Chaperone was present for exam.  ASSESSMENT  Right breast mass (ridge) is unchanged.  Negative evaluation. Normal breast imaging.  FH of breast cancer.  Mirena IUD patient.  Doing well with this overall.   PLAN  OK for observational management of breast care.  Patient declines consultation with surgeon.  Plan for routine  mammogram at age 66 unless indicated due to change in self exam or clinical exam.  Follow up for routine annual exam and prn.   An After Visit Summary was printed and given to the patient.  ____15__ minutes face to face time of which over 50% was spent in counseling.

## 2016-03-24 ENCOUNTER — Telehealth: Payer: Self-pay | Admitting: Family Medicine

## 2016-03-24 ENCOUNTER — Other Ambulatory Visit: Payer: Self-pay | Admitting: *Deleted

## 2016-03-24 DIAGNOSIS — K50119 Crohn's disease of large intestine with unspecified complications: Secondary | ICD-10-CM

## 2016-03-24 NOTE — Telephone Encounter (Signed)
How does this work--I thought we couldn't release their orders.  Would we have to put them in ourselves and then forward them to him?

## 2016-03-24 NOTE — Telephone Encounter (Signed)
Pt coming in for labs for cholesterol & wants to also have her labs for Dr. Carlean Purl drawn at the same time, orders are in but wanted to make sure ok with you?

## 2016-03-24 NOTE — Telephone Encounter (Signed)
Yes

## 2016-03-24 NOTE — Telephone Encounter (Signed)
Reordered under Dr.Knapp's name.

## 2016-03-24 NOTE — Telephone Encounter (Signed)
Can you please do that? Thanks

## 2016-03-27 ENCOUNTER — Other Ambulatory Visit: Payer: Self-pay | Admitting: Internal Medicine

## 2016-03-27 ENCOUNTER — Other Ambulatory Visit: Payer: Self-pay | Admitting: Family Medicine

## 2016-03-30 ENCOUNTER — Other Ambulatory Visit: Payer: 59

## 2016-03-30 DIAGNOSIS — K50119 Crohn's disease of large intestine with unspecified complications: Secondary | ICD-10-CM

## 2016-03-30 DIAGNOSIS — E781 Pure hyperglyceridemia: Secondary | ICD-10-CM

## 2016-03-30 LAB — CBC WITH DIFFERENTIAL/PLATELET
Basophils Absolute: 0 cells/uL (ref 0–200)
Basophils Relative: 0 %
EOS PCT: 1 %
Eosinophils Absolute: 59 cells/uL (ref 15–500)
HEMATOCRIT: 36.3 % (ref 35.0–45.0)
HEMOGLOBIN: 12.3 g/dL (ref 11.7–15.5)
LYMPHS ABS: 944 {cells}/uL (ref 850–3900)
Lymphocytes Relative: 16 %
MCH: 33.2 pg — ABNORMAL HIGH (ref 27.0–33.0)
MCHC: 33.9 g/dL (ref 32.0–36.0)
MCV: 97.8 fL (ref 80.0–100.0)
MONO ABS: 590 {cells}/uL (ref 200–950)
MPV: 8.8 fL (ref 7.5–12.5)
Monocytes Relative: 10 %
NEUTROS ABS: 4307 {cells}/uL (ref 1500–7800)
NEUTROS PCT: 73 %
PLATELETS: 358 10*3/uL (ref 140–400)
RBC: 3.71 MIL/uL — AB (ref 3.80–5.10)
RDW: 14.1 % (ref 11.0–15.0)
WBC: 5.9 10*3/uL (ref 4.0–10.5)

## 2016-03-30 LAB — LIPID PANEL
Cholesterol: 151 mg/dL (ref 125–200)
HDL: 62 mg/dL (ref >=46)
LDL Cholesterol: 62 mg/dL (ref <130)
TRIGLYCERIDES: 135 mg/dL (ref <150)
Total CHOL/HDL Ratio: 2.4 Ratio (ref <=5.0)
VLDL: 27 mg/dL (ref <30)

## 2016-03-30 LAB — HEPATIC FUNCTION PANEL
ALBUMIN: 3.5 g/dL — AB (ref 3.6–5.1)
ALT: 8 U/L (ref 6–29)
AST: 16 U/L (ref 10–30)
Alkaline Phosphatase: 30 U/L — ABNORMAL LOW (ref 33–115)
Bilirubin, Direct: 0.1 mg/dL (ref <=0.2)
Indirect Bilirubin: 0.2 mg/dL (ref 0.2–1.2)
TOTAL PROTEIN: 5.9 g/dL — AB (ref 6.1–8.1)
Total Bilirubin: 0.3 mg/dL (ref 0.2–1.2)

## 2016-04-01 ENCOUNTER — Other Ambulatory Visit: Payer: Self-pay | Admitting: *Deleted

## 2016-04-01 MED ORDER — FENOFIBRATE 145 MG PO TABS: 145.0000 mg | ORAL_TABLET | Freq: Every day | ORAL | Status: DC

## 2016-04-26 ENCOUNTER — Encounter: Payer: Self-pay | Admitting: Family Medicine

## 2016-05-20 ENCOUNTER — Ambulatory Visit (INDEPENDENT_AMBULATORY_CARE_PROVIDER_SITE_OTHER): Payer: 59 | Admitting: Family Medicine

## 2016-05-20 ENCOUNTER — Encounter: Payer: Self-pay | Admitting: Family Medicine

## 2016-05-20 VITALS — BP 110/60 | HR 68 | Temp 98.6°F | Resp 16 | Ht 64.0 in | Wt 197.6 lb

## 2016-05-20 DIAGNOSIS — S46912A Strain of unspecified muscle, fascia and tendon at shoulder and upper arm level, left arm, initial encounter: Secondary | ICD-10-CM | POA: Diagnosis not present

## 2016-05-20 MED ORDER — CELECOXIB 200 MG PO CAPS: 200.0000 mg | ORAL_CAPSULE | Freq: Every day | ORAL | Status: DC | PRN

## 2016-05-20 NOTE — Progress Notes (Signed)
Chief Complaint  Patient presents with  . Shoulder Pain    left shoulder pain x 8 days. reports this is chronic issue. no injury. has not seen ortho.    History of problems with soreness in her left shoulder off and on for years.  Usually it gets better with rest.  A week ago the pain started flaring up, and worsening daily rather than improving, even with rest.  No known injury. She increased to 5# weights from a resistance band recently.  Pain is posteriorly at the shoulder joint, and spreads across the upper shoulder throughout the day.  Doesn't feel like a sore muscle.  Feels like a burning. It doesn't hurt for her to move her arm.  She tried stretching, ibuprofen 645m 3-4 times daily for 2 days (stopped when diarrhea developed, and it was not helping much).  She tried Biofreeze, which temporarily helps, but hurts worse when it wears off.  She has been trying to rest it and limit motion, also wearing a sling for a couple of hours in the evening.   No pain with reaching overhead. She gets a burning posteriorly after using the arm at all. She notices the discomfort the most when she is at rest, sitting, especially when leaning back. Leaning forward, and letting arm hang forwards relieves some of the discomfort. Slight neck stiffness on the left side, mild. No radiation of pain into the arm, no numbness, tingling or weakness in the hand.  PMH, PSH, SH reviewed Current Outpatient Prescriptions on File Prior to Visit  Medication Sig Dispense Refill  . acetaminophen (TYLENOL) 500 MG tablet Take 1,000 mg by mouth every 6 (six) hours as needed. Reported on 12/15/2015    . azaTHIOprine (IMURAN) 50 MG tablet TAKE 3 TABLETS BY MOUTH DAILY 90 tablet 2  . buPROPion (WELLBUTRIN SR) 100 MG 12 hr tablet Take 1 tablet (100 mg total) by mouth 2 (two) times daily. 180 tablet 3  . Calcium-Vitamin D-Vitamin K (CALCIUM SOFT CHEWS) 500-100-40 MG-UNT-MCG CHEW Chew 1 tablet by mouth 2 (two) times daily.    .  Certolizumab Pegol (CIMZIA PREFILLED) 2 X 200 MG/ML KIT Inject 200 mg into the skin every 14 (fourteen) days. 1 kit 6  . cyanocobalamin (,VITAMIN B-12,) 1000 MCG/ML injection INJECT AS DIRECTED EVERY 30 DAYS 3 mL 11  . diphenhydrAMINE (SOMINEX) 25 MG tablet Take 25 mg by mouth at bedtime as needed for sleep.    . diphenoxylate-atropine (LOMOTIL) 2.5-0.025 MG per tablet Take 1 tablet by mouth 4 (four) times daily as needed for diarrhea or loose stools. 120 tablet 0  . escitalopram (LEXAPRO) 10 MG tablet Take 1 tablet (10 mg total) by mouth daily. 90 tablet 3  . fenofibrate (TRICOR) 145 MG tablet Take 1 tablet (145 mg total) by mouth daily. 30 tablet 5  . fexofenadine (ALLEGRA) 180 MG tablet Take 180 mg by mouth daily.    .Marland Kitchenlevonorgestrel (MIRENA) 20 MCG/24HR IUD 1 each by Intrauterine route once.    . Omega-3 Fatty Acids (FISH OIL) 1000 MG CAPS Take 2 capsules by mouth daily.     No current facility-administered medications on file prior to visit.   Allergies  Allergen Reactions  . Ibuprofen     Avoids due to Crohns disease  . Prednisone     "crazy", hallucinatory   ROS: no fever, chills, headaches, dizziness, URI symptoms, bleeding, bruising, rash.  GI stable--had diarrhea after taking ibuprofen.  See HPI  PHYSICAL EXAM: BP 110/60 mmHg  Pulse  68  Temp(Src) 98.6 F (37 C) (Oral)  Resp 16  Ht 5' 4"  (1.626 m)  Wt 197 lb 9.6 oz (89.631 kg)  BMI 33.90 kg/m2 Well developed, pleasant female in no distress Neck: no spinal tenderness, no muscle spasm, no lymphadenopathy or mass L shoulder:  FROM without pain.  Normal strength; no pain with rotator cuff testing. No impingement.  Tender at the upper portion of the long head of the tricep vs latissimus tendon.  ASSESSMENT/PLAN:  Muscle strain, shoulder region, left, initial encounter - Plan: celecoxib (CELEBREX) 200 MG capsule   Do the stretches as shown at least 3 times daily. Continue trying moist heat, icy hot. Take the celelbrex  once daily for 7-10 days (you can stop sooner if your pain resolves).  If not improving, next step would be physical therapy. Contact us for the referral if/when needed.   ([facility] would be fine, or [facility])

## 2016-05-20 NOTE — Patient Instructions (Signed)
  Do the stretches as shown at least 3 times daily. Continue trying moist heat, icy hot. Take the celelbrex once daily for 7-10 days (you can stop sooner if your pain resolves).  If not improving, next step would be physical therapy. Contact us for the referral if/when needed.

## 2016-06-16 ENCOUNTER — Other Ambulatory Visit: Payer: Self-pay | Admitting: Family Medicine

## 2016-06-16 DIAGNOSIS — S46912A Strain of unspecified muscle, fascia and tendon at shoulder and upper arm level, left arm, initial encounter: Secondary | ICD-10-CM

## 2016-06-16 NOTE — Telephone Encounter (Signed)
Patient hit wrong button on phone, does not need.

## 2016-06-16 NOTE — Telephone Encounter (Signed)
Is this ok to refill?  

## 2016-06-16 NOTE — Telephone Encounter (Signed)
Last filled 7/20 for #30.  This is not supposed to be a long-term med, just used sparingly prn.  Check with pt--wondering if on auto-refill.  If ongoing shoulder problems, we should consider PT, not ongoing NSAID, especially if not improving.

## 2016-06-24 ENCOUNTER — Other Ambulatory Visit: Payer: Self-pay | Admitting: Internal Medicine

## 2016-08-02 ENCOUNTER — Encounter: Payer: Self-pay | Admitting: Family Medicine

## 2016-08-02 ENCOUNTER — Ambulatory Visit (INDEPENDENT_AMBULATORY_CARE_PROVIDER_SITE_OTHER): Payer: 59 | Admitting: Family Medicine

## 2016-08-02 VITALS — BP 110/70 | HR 84 | Ht 64.0 in | Wt 202.8 lb

## 2016-08-02 DIAGNOSIS — Z23 Encounter for immunization: Secondary | ICD-10-CM | POA: Diagnosis not present

## 2016-08-02 DIAGNOSIS — Z5181 Encounter for therapeutic drug level monitoring: Secondary | ICD-10-CM

## 2016-08-02 DIAGNOSIS — K5000 Crohn's disease of small intestine without complications: Secondary | ICD-10-CM | POA: Diagnosis not present

## 2016-08-02 DIAGNOSIS — F325 Major depressive disorder, single episode, in full remission: Secondary | ICD-10-CM | POA: Diagnosis not present

## 2016-08-02 LAB — CBC WITH DIFFERENTIAL/PLATELET
BASOS ABS: 0 {cells}/uL (ref 0–200)
Basophils Relative: 0 %
EOS PCT: 1 %
Eosinophils Absolute: 86 cells/uL (ref 15–500)
HEMATOCRIT: 35.7 % (ref 35.0–45.0)
Hemoglobin: 12.1 g/dL (ref 11.7–15.5)
LYMPHS PCT: 19 %
Lymphs Abs: 1634 cells/uL (ref 850–3900)
MCH: 32.6 pg (ref 27.0–33.0)
MCHC: 33.9 g/dL (ref 32.0–36.0)
MCV: 96.2 fL (ref 80.0–100.0)
MPV: 9 fL (ref 7.5–12.5)
Monocytes Absolute: 774 cells/uL (ref 200–950)
Monocytes Relative: 9 %
NEUTROS PCT: 71 %
Neutro Abs: 6106 cells/uL (ref 1500–7800)
Platelets: 401 10*3/uL — ABNORMAL HIGH (ref 140–400)
RBC: 3.71 MIL/uL — AB (ref 3.80–5.10)
RDW: 13.8 % (ref 11.0–15.0)
WBC: 8.6 10*3/uL (ref 4.0–10.5)

## 2016-08-02 LAB — HEPATIC FUNCTION PANEL
ALBUMIN: 3.6 g/dL (ref 3.6–5.1)
ALT: 9 U/L (ref 6–29)
AST: 15 U/L (ref 10–30)
Alkaline Phosphatase: 26 U/L — ABNORMAL LOW (ref 33–115)
Bilirubin, Direct: 0.1 mg/dL (ref <=0.2)
Indirect Bilirubin: 0.1 mg/dL — ABNORMAL LOW (ref 0.2–1.2)
Total Bilirubin: 0.2 mg/dL (ref 0.2–1.2)
Total Protein: 6 g/dL — ABNORMAL LOW (ref 6.1–8.1)

## 2016-08-02 NOTE — Progress Notes (Signed)
Chief Complaint  Patient presents with  . Advice Only    would like to come off of Lexapro, did decrease from 2m to 571mdaily about a month ago and is doing well.    Patient has weaned down from 10 to 35m41mf the lexapro, cutting the dose about a month ago. She is asking about tapering down further.  She reports that since cutting the dose back, she cries when frustrated, if someone pisses her off, once or twice a week (mother). Able to hide it better, "stifle it" while on the meds.  Lasts 10 minutes, and is fine after, always at home when this happens, and not bothersome to her. Her partner ThoMarcello Mooresesn't like to see her cry, wants her to go back up on the dose. She has been working hard trying not to emotionally eat. She is back to exercising regularly, helps with stress reduction. She hasn't had any palpitations or increase in anxiety.  Chart reviewed--in the past when she cut the dose back she had increased anxiety palpitations.  She feels a little too care-free at the 20m49mse (such as knowing she should exercise, but not really feeling motivated to do so), which is why she cut back.  No other side effects.  She is doing Weight Watchers, and is down 11# since December (per [employer]). Going to the gym and doing TRX 3 times/week for the last 3 weeks, plus doing a lot of walking, 45 minutes 4x/week.  She reports that celebrex caused constipation and didn't help as much as ibuprofen for pain.  Was so effective for causing constipation that she may use it prn instead of lomotil when traveling (because that causes sedation).  She is asking to have labs done for Dr. GessCarlean Purlle here today.   PMH, PSH,[place] reviewed  Outpatient Encounter Prescriptions as of 08/02/2016  Medication Sig Note  . azaTHIOprine (IMURAN) 50 MG tablet TAKE 3 TABLETS BY MOUTH DAILY   . buPROPion (WELLBUTRIN SR) 100 MG 12 hr tablet Take 1 tablet (100 mg total) by mouth 2 (two) times daily.   . Calcium-Vitamin D-Vitamin K  (CALCIUM SOFT CHEWS) 500-100-40 MG-UNT-MCG CHEW Chew 1 tablet by mouth 2 (two) times daily.   . Certolizumab Pegol (CIMZIA PREFILLED) 2 X 200 MG/ML KIT Inject 200 mg into the skin every 14 (fourteen) days.   . cyanocobalamin (,VITAMIN B-12,) 1000 MCG/ML injection INJECT AS DIRECTED EVERY 30 DAYS   . diphenhydrAMINE (SOMINEX) 25 MG tablet Take 25 mg by mouth at bedtime as needed for sleep. 10/07/2014: Takes every night  . diphenoxylate-atropine (LOMOTIL) 2.5-0.025 MG per tablet Take 1 tablet by mouth 4 (four) times daily as needed for diarrhea or loose stools.   . esMarland Kitchenitalopram (LEXAPRO) 10 MG tablet Take 1 tablet (10 mg total) by mouth daily. 08/02/2016: Taking 35mg 13mly.  . fenofibrate (TRICOR) 145 MG tablet Take 1 tablet (145 mg total) by mouth daily.   . fexofenadine (ALLEGRA) 180 MG tablet Take 180 mg by mouth daily.   . levMarland Kitchennorgestrel (MIRENA) 20 MCG/24HR IUD 1 each by Intrauterine route once.   . Omega-3 Fatty Acids (FISH OIL) 1000 MG CAPS Take 2 capsules by mouth daily.   . aceMarland Kitchenaminophen (TYLENOL) 500 MG tablet Take 1,000 mg by mouth every 6 (six) hours as needed. Reported on 12/15/2015 04/14/2015: prn  . celecoxib (CELEBREX) 200 MG capsule Take 1 capsule (200 mg total) by mouth daily as needed (pain). (Patient not taking: Reported on 08/02/2016)   . [DISCONTINUED]  azaTHIOprine (IMURAN) 50 MG tablet TAKE 3 TABLETS BY MOUTH DAILY    No facility-administered encounter medications on file as of 08/02/2016.    Allergies  Allergen Reactions  . Ibuprofen     Avoids due to Crohns disease  . Prednisone     "crazy", hallucinatory   ROS: no fever, chills, headaches, dizziness, abdominal pain, nausea, vomiting, bowel changes, urinary complaints, joint pains, muscle pains, insomnia.  Moods as per HPI.  PHYSICAL EXAM:  BP 110/70 (BP Location: Left Arm, Patient Position: Sitting, Cuff Size: Normal)   Pulse 84   Ht _0  (1.626 m)   Wt 202 lb 12.8 oz (92 kg)   BMI 34.81 kg/m   Well developed,  pleasant female in good spirits.   Normal mood, affect, hygiene and grooming Normal eye contact, speech. Normal cranial nerves, gait  ASSESSMENT/PLAN:  Depression, major, in remission ([place]) - recommended continuing at 77m since have some change in sx; consider taper off after holidays if doing well  Need for prophylactic vaccination and inoculation against influenza - Plan: Flu Vaccine QUAD 36+ mos PF IM (Fluarix & Fluzone Quad PF), Hepatic function panel, CBC with Differential/Platelet  Medication monitoring encounter - will need to send results to Dr. GCarlean Purl- Plan: Hepatic function panel, CBC with Differential/Platelet  Crohn's disease of small intestine without complication ([place] - well controlled on her current regimen   She is requesting to have the orders for Dr. GCarlean Purl(routine, standing CBC and LFT's) to be done while she is here today. Will forward results to Dr. GCarlean Purlwhen available.  F/u as scheduled for next physical, sooner prn.

## 2016-08-02 NOTE — Patient Instructions (Signed)
Continue the wellbutrin as well as the 76m of escitalopram.  I wouldn't rush to stop this, as decreasing it too soon could cause a backlash in your weight loss attempts (if increases anxiety and reaching for food for comfort).  I would remain on the 564mdose until your physical.  If you find that your irritability/moods/crying are worsening and not acceptable at this dose, but you felt too "carefree" at the 1012mwe can try doing a 7.5mg18mse.  We could change your prescription to a 5mg 18mlet and have you take 1.5 tablets daily (this is likely easier than trying to take 3/4 of a 10mg 67met).  Just let us kn[place] we can send in a prescription.  Continue your regular exercise routine and Weight Watchers.

## 2016-08-03 ENCOUNTER — Other Ambulatory Visit: Payer: Self-pay

## 2016-08-03 DIAGNOSIS — Z79899 Other long term (current) drug therapy: Secondary | ICD-10-CM

## 2016-08-03 NOTE — Progress Notes (Signed)
Labs are fine  Needs CBC and LFT in 4 mos - on immunomodulator

## 2016-08-25 ENCOUNTER — Other Ambulatory Visit: Payer: Self-pay | Admitting: Internal Medicine

## 2016-09-19 ENCOUNTER — Other Ambulatory Visit: Payer: Self-pay | Admitting: Family Medicine

## 2016-09-24 ENCOUNTER — Encounter: Payer: Self-pay | Admitting: Internal Medicine

## 2016-09-27 ENCOUNTER — Other Ambulatory Visit: Payer: Self-pay

## 2016-09-27 DIAGNOSIS — Z79899 Other long term (current) drug therapy: Secondary | ICD-10-CM

## 2016-10-07 ENCOUNTER — Encounter: Payer: Self-pay | Admitting: Internal Medicine

## 2016-10-07 ENCOUNTER — Ambulatory Visit (INDEPENDENT_AMBULATORY_CARE_PROVIDER_SITE_OTHER): Payer: 59 | Admitting: Internal Medicine

## 2016-10-07 VITALS — BP 102/72 | HR 92 | Ht 64.0 in | Wt 202.1 lb

## 2016-10-07 DIAGNOSIS — Z79899 Other long term (current) drug therapy: Secondary | ICD-10-CM

## 2016-10-07 DIAGNOSIS — R233 Spontaneous ecchymoses: Secondary | ICD-10-CM

## 2016-10-07 DIAGNOSIS — K5000 Crohn's disease of small intestine without complications: Secondary | ICD-10-CM

## 2016-10-07 DIAGNOSIS — R238 Other skin changes: Secondary | ICD-10-CM

## 2016-10-07 DIAGNOSIS — Z7960 Long term (current) use of unspecified immunomodulators and immunosuppressants: Secondary | ICD-10-CM

## 2016-10-07 NOTE — Patient Instructions (Signed)
   You may get your labs checked next week as we discussed.  CBC, hepatic function panel, PT/INR and PTT.  Please show this to the lab as two of the orders (CBC and hepatic function panel) were expected in February - we should make changes for them to be next week but do this as a back-up  I appreciate the opportunity to care for you. Gatha Mayer, MD, [facility]

## 2016-10-07 NOTE — Progress Notes (Addendum)
   Lindsay Mccarthy 40 y.o. 1976-05-10 [id number]  Assessment & Plan:   Encounter Diagnoses  Name Primary?  . Crohn's disease of small intestine without complication ([place]) Yes  . Long-term use of immunosuppressant medication   . Bruises easily     For bruising will check coags and CBC LFT next week when she comes for DEXA. Seems unlikely that anemia related to bleeding but reasonable to check her labs - will at least reassure if all ok.   Continue  Cimzia and Imuran. RTC 6 mos Has had flu vaccine - discussed Prevnar - 13  She will think about it Await f/u DEXA - has some osteopenia Had low vit D level - was Tx in Feb by Dr. Tomi Bamberger Had PAP smear ok 2017 Skin screening? - will review next time   I appreciate the opportunity to care for this patient.  Cc;KNAPP,EVE A, MD    Subjective:   Chief Complaint:  HPI GI - ok - no abdominal pain or diarrhea believes Crohn's Asx Says getting bruised easily - in past has been anemic when this occurs Dog jummps and getting bruises on shins - also had larger than usual with SQ injection last week  Hair thinning some - never had before  Medications, allergies, past medical history, past surgical history, family history and social history are reviewed and updated in the EMR.  Review of Systems As above  Objective:   Physical Exam @BP  102/72 (BP Location: Left Arm, Patient Position: Sitting, Cuff Size: Normal)   Pulse 92   Ht 5' 4"  (1.626 m) Comment: height measured without shoes  Wt 202 lb 2 oz (91.7 kg)   BMI 34.69 kg/m @  General:  NAD Eyes:   anicteric Lungs:  clear Heart::  S1S2 no rubs, murmurs or gallops Abdomen:  soft and nontender, BS+ Skin:  A few bruises - shins and where injection was RLQ - also a superficial linear abrasion right shin    Data Reviewed:  See above

## 2016-10-14 ENCOUNTER — Other Ambulatory Visit (INDEPENDENT_AMBULATORY_CARE_PROVIDER_SITE_OTHER): Payer: 59

## 2016-10-14 ENCOUNTER — Ambulatory Visit (INDEPENDENT_AMBULATORY_CARE_PROVIDER_SITE_OTHER)
Admission: RE | Admit: 2016-10-14 | Discharge: 2016-10-14 | Disposition: A | Discharge status: Home / Self Care | Payer: 59 | Source: Ambulatory Visit | Attending: Family Medicine | Admitting: Family Medicine

## 2016-10-14 DIAGNOSIS — R233 Spontaneous ecchymoses: Secondary | ICD-10-CM

## 2016-10-14 DIAGNOSIS — R238 Other skin changes: Secondary | ICD-10-CM

## 2016-10-14 DIAGNOSIS — Z79899 Other long term (current) drug therapy: Secondary | ICD-10-CM

## 2016-10-14 LAB — PROTIME-INR
INR: 1 ratio (ref 0.8–1.0)
PROTHROMBIN TIME: 10.7 s (ref 9.6–13.1)

## 2016-10-14 LAB — APTT: aPTT: 24.1 s (ref 23.4–32.7)

## 2016-10-14 NOTE — Progress Notes (Signed)
Her coags are ok so blood clotting NL We were going to run CBC early also and it did not get done - please see about that

## 2016-10-15 ENCOUNTER — Other Ambulatory Visit (INDEPENDENT_AMBULATORY_CARE_PROVIDER_SITE_OTHER): Payer: 59

## 2016-10-15 ENCOUNTER — Other Ambulatory Visit: Payer: Self-pay | Admitting: Family Medicine

## 2016-10-15 ENCOUNTER — Telehealth: Payer: Self-pay

## 2016-10-15 ENCOUNTER — Other Ambulatory Visit: Payer: 59

## 2016-10-15 DIAGNOSIS — K5000 Crohn's disease of small intestine without complications: Secondary | ICD-10-CM

## 2016-10-15 DIAGNOSIS — F325 Major depressive disorder, single episode, in full remission: Secondary | ICD-10-CM

## 2016-10-15 LAB — CBC WITH DIFFERENTIAL/PLATELET
BASOS ABS: 0.1 10*3/uL (ref 0.0–0.1)
Basophils Relative: 0.8 % (ref 0.0–3.0)
Eosinophils Absolute: 0.1 10*3/uL (ref 0.0–0.7)
Eosinophils Relative: 0.7 % (ref 0.0–5.0)
HCT: 37 % (ref 36.0–46.0)
Hemoglobin: 12.5 g/dL (ref 12.0–15.0)
LYMPHS ABS: 1.4 10*3/uL (ref 0.7–4.0)
Lymphocytes Relative: 15.8 % (ref 12.0–46.0)
MCHC: 33.8 g/dL (ref 30.0–36.0)
MCV: 97.8 fl (ref 78.0–100.0)
Monocytes Absolute: 0.8 10*3/uL (ref 0.1–1.0)
Monocytes Relative: 8.5 % (ref 3.0–12.0)
NEUTROS ABS: 6.6 10*3/uL (ref 1.4–7.7)
Neutrophils Relative %: 74.2 % (ref 43.0–77.0)
PLATELETS: 445 10*3/uL — AB (ref 150.0–400.0)
RBC: 3.79 Mil/uL — ABNORMAL LOW (ref 3.87–5.11)
RDW: 14.4 % (ref 11.5–15.5)
WBC: 8.9 10*3/uL (ref 4.0–10.5)

## 2016-10-15 LAB — HEPATIC FUNCTION PANEL
ALK PHOS: 33 U/L — AB (ref 39–117)
ALT: 11 U/L (ref 0–35)
AST: 19 U/L (ref 0–37)
Albumin: 3.8 g/dL (ref 3.5–5.2)
BILIRUBIN DIRECT: 0 mg/dL (ref 0.0–0.3)
TOTAL PROTEIN: 6.6 g/dL (ref 6.0–8.3)
Total Bilirubin: 0.3 mg/dL (ref 0.2–1.2)

## 2016-10-15 NOTE — Telephone Encounter (Signed)
She was written a year supply (#180 with 3 RF) in 12/2015.  Shouldn't need refill currently.  Should have enough to last until refilled for a year at her 12/2016 visit

## 2016-10-15 NOTE — Telephone Encounter (Signed)
Is this okay to refill? 

## 2016-10-15 NOTE — Telephone Encounter (Signed)
Her coags are ok so blood clotting NL  We were going to run CBC early also and it did not get done - please see about that   See results under lab tab.   Informed Trish and she will come back and have the CBC/diff and Hepatic panel drawn that was supposed to be done but didn't get drawn because the expected date was feb the lab didn't release it. I called the lab and spoke to Senaida Ores  the Management consultant.  Labs have been reordered.

## 2016-10-22 ENCOUNTER — Other Ambulatory Visit: Payer: Self-pay | Admitting: Internal Medicine

## 2016-10-22 ENCOUNTER — Other Ambulatory Visit: Payer: Self-pay | Admitting: Family Medicine

## 2016-10-26 ENCOUNTER — Other Ambulatory Visit: Payer: Self-pay

## 2016-10-26 DIAGNOSIS — Z79899 Other long term (current) drug therapy: Secondary | ICD-10-CM

## 2016-10-26 NOTE — Progress Notes (Signed)
Liver tests and the CBC are ok No cause of bruising detected here Would see pcp if that persists  Repeat cbc and LFT in 4 mos

## 2016-10-27 NOTE — Progress Notes (Signed)
Stable findings - hips (minimal decrease in femoral necks) Improvement in spine No changes F/u PCP as planned My Chart note sent

## 2016-12-12 ENCOUNTER — Encounter: Payer: Self-pay | Admitting: Family Medicine

## 2016-12-12 DIAGNOSIS — Z0000 Encounter for general adult medical examination without abnormal findings: Secondary | ICD-10-CM

## 2016-12-12 DIAGNOSIS — E781 Pure hyperglyceridemia: Secondary | ICD-10-CM

## 2016-12-12 DIAGNOSIS — E559 Vitamin D deficiency, unspecified: Secondary | ICD-10-CM

## 2016-12-14 ENCOUNTER — Other Ambulatory Visit: Payer: 59

## 2016-12-14 DIAGNOSIS — Z0000 Encounter for general adult medical examination without abnormal findings: Secondary | ICD-10-CM

## 2016-12-14 DIAGNOSIS — E559 Vitamin D deficiency, unspecified: Secondary | ICD-10-CM

## 2016-12-14 DIAGNOSIS — E781 Pure hyperglyceridemia: Secondary | ICD-10-CM

## 2016-12-14 LAB — LIPID PANEL
CHOLESTEROL: 136 mg/dL (ref <200)
HDL: 63 mg/dL (ref >50)
LDL Cholesterol: 50 mg/dL (ref <100)
Total CHOL/HDL Ratio: 2.2 Ratio (ref <5.0)
Triglycerides: 115 mg/dL (ref <150)
VLDL: 23 mg/dL (ref <30)

## 2016-12-14 LAB — GLUCOSE, RANDOM: GLUCOSE: 81 mg/dL (ref 65–99)

## 2016-12-14 NOTE — Progress Notes (Signed)
Chief Complaint  Patient presents with  . Annual Exam    nonfasting annual exam (labs done yesterday) no pap, sees Dr.Silva and is UTD. No concerns.     Lindsay Mccarthy is a 41 y.o. female who presents for a complete physical.  She has no complaints.  Hypertriglyceridemia:  She is following a lowfat diet. She is compliant with medications and denies side effects. Lab Results  Component Value Date   CHOL 151 03/30/2016   HDL 62 03/30/2016   LDLCALC 62 03/30/2016   TRIG 135 03/30/2016   CHOLHDL 2.4 03/30/2016    She previously had abnormal paps, HPV. Treated with LEEP in 2011. She is under the care of Dr. Quincy Simmonds for her GYN care.  She had her Mirena IUD replaced in April 2017. She has never been pregnant, and doesn't want children.   Depression follow-up: She decreased lexapro to 48m, as discussed at her last visit in October. She increased it to 7.555mshortly after the last visit, and is doing well.  She is taking 3/4 tablet without problems cutting them.  She also continues on Wellbutrin SR 10052mID.  Moods are well controlled. In the past, when dose was cut, she had increased anxiety, heart palpitations.  Osteopenia:  DEXA 09/2013 showed T -1.6.  She had repeat study 10/2016: Results:  Lumbar spine (L1-L4) Femoral neck (FN) 33% distal radius  T-score 1.4 RFN:-1.3 LFN:-1.1 n/a  Change in BMD from previous DXA test (%) Up 7.8% Down 0.1% n/a  (*) statistically significant  She takes Viactiv BID. She continues to get some cheese in her diet. She cut out yogurt and cow's milk.  Obesity:  She is back on Weight Watchers. She has lost some weight; it keeps her motivated.   Immunization History  Administered Date(s) Administered  . Hepatitis A 06/29/2012, 01/04/2013  . Influenza Split 08/09/2012  . Influenza,inj,Quad PF,36+ Mos 09/18/2013, 10/07/2014, 12/15/2015, 08/02/2016  . PPD Test 04/30/2013, 10/07/2014  . Pneumococcal Polysaccharide-23 07/06/2012  . Tdap 12/15/2015    Last Pap smear: 12/2015, normal with no high risk HPV Last mammogram: 12/2015 [place] Ophtho: yearly Dentist: twice yearly  Exercise: getting at least 10K steps daily. Marches in front of the TV, jogging in place, for at least 10-15 minutes at a time, while watching TV. At least 30 minutes/d, at least 5d/week. Some lower body weightbearing exercise more than upper body.  Past Medical History:  Diagnosis Date  . Anemia    related to Crohns flares  . Anxiety   . Arthritis    knees, feet, hands, wrists, related to Crohns flare  . Asthma    Childhood  . B12 deficiency    monitored/treated by Dr. GesCarlean Purl Cervical dysplasia   . Crohn's disease of small intestine ([place]   [place] Treated with Imuran Remicade and prednisone.  Noncompliant with therapy. 2004 return to care and was treated with Remicade prednisone Pentasa Cipro and Flagyl. 2006 status post right hemicolectomy. Subsequently treated with azathioprine and Cimzia. 200 mg every other week.  azathioprine was added in 2010. Prometheus TP MT enzyme was negative. Has ha  . Depression   . Eczema    arms and behind knees, worse in winter  . History of recurrent UTI (urinary tract infection)   . HPV (human papilloma virus) infection   . Hyperlipemia   . Osteopenia of femur neck T. score -1.4 09/29/2011   09/2013 T-1.6  .  Papanicolaou smear 12/12   last abnormal 2011  . Scaphoid fracture of wrist 2013   left  . Seasonal allergic rhinitis   . Shingles 09/2015   R hip  . Wears glasses     Past Surgical History:  Procedure Laterality Date  . APPENDECTOMY    . CERVICAL BIOPSY  W/ LOOP ELECTRODE EXCISION  2009   ---paps normal since  . COLONOSCOPY  multiple   scanned  . ESOPHAGOGASTRODUODENOSCOPY  multiple   scanned  . HEMICOLECTOMY  2006  . LEEP  2011   --done in [place]  . WISDOM TOOTH EXTRACTION      Social History    Social History  . Marital status: Single    Spouse name: N/A  . Number of children: N/A  . Years of education: N/A   Occupational History  . Quality Analyst    Social History Main Topics  . Smoking status: Former Smoker    Packs/day: 1.00    Years: 4.00    Quit date: 11/18/1998  . Smokeless tobacco: Never Used  . Alcohol use No  . Drug use: No  . Sexual activity: Yes    Partners: Male    Birth control/ protection: IUD     Comment: Mirena inserted 01/2016   Other Topics Concern  . Not on file   Social History Narrative   The patient is divorced.     Re-married Marcello Moores, partner of 10 years, on 10/10/16. 1 cats, 1 dog   No children - doesn't want any   [place].   Moved from [place] to [place] in 2013.   Past smoker   No alcohol   2 caffeinated beverages a day   She reports she is compliant with sunscreen given her increased risk of sun damage and skin cancer on azathioprine      Updated 12/15/16    Family History  Problem Relation Age of Onset  . Hypertension Mother   . Breast cancer Mother 19  . Colon polyps Father   . Diabetes Father     borderline  . Hypertension Father   . Hyperlipidemia Father   . Colon cancer Paternal Grandfather   . Multiple sclerosis Sister   . Alcohol abuse Sister   . Stroke Maternal Grandmother   . Lung cancer Maternal Grandmother   . Hypertension Maternal Grandmother   . Heart disease Paternal Grandmother   . Hypertension Paternal Grandmother     Outpatient Encounter Prescriptions as of 12/15/2016  Medication Sig Note  . azaTHIOprine (IMURAN) 50 MG tablet TAKE 3 TABLETS BY MOUTH EVERY DAY   . buPROPion (WELLBUTRIN SR) 100 MG 12 hr tablet Take 1 tablet (100 mg total) by mouth 2 (two) times daily.   . Calcium-Vitamin D-Vitamin K (CALCIUM SOFT CHEWS) 500-100-40 MG-UNT-MCG CHEW Chew 1 tablet by mouth 2 (two) times daily.   . Certolizumab Pegol (CIMZIA PREFILLED) 2 X 200 MG/ML KIT Inject 200 mg into the  skin every 14 (fourteen) days.   . cyanocobalamin (,VITAMIN B-12,) 1000 MCG/ML injection INJECT AS DIRECTED EVERY 30 DAYS   . diphenhydrAMINE (SOMINEX) 25 MG tablet Take 25 mg by mouth at bedtime as needed for sleep. 10/07/2014: Takes every night  . escitalopram (LEXAPRO) 10 MG tablet Take 1 tablet (10 mg total) by mouth daily. 12/15/2016: 7.5 daily  . fenofibrate (TRICOR) 145 MG tablet TAKE 1 TABLET (145 MG TOTAL) BY MOUTH DAILY.   . fexofenadine (ALLEGRA) 180 MG tablet Take 180 mg by mouth daily.   Marland Kitchen  levonorgestrel (MIRENA) 20 MCG/24HR IUD 1 each by Intrauterine route once.   . Omega-3 Fatty Acids (FISH OIL) 1000 MG CAPS Take 2 capsules by mouth daily.   Marland Kitchen acetaminophen (TYLENOL) 500 MG tablet Take 1,000 mg by mouth every 6 (six) hours as needed. Reported on 12/15/2015 04/14/2015: prn  . diphenoxylate-atropine (LOMOTIL) 2.5-0.025 MG per tablet Take 1 tablet by mouth 4 (four) times daily as needed for diarrhea or loose stools. (Patient not taking: Reported on 12/15/2016)   . [DISCONTINUED] celecoxib (CELEBREX) 200 MG capsule Take 1 capsule (200 mg total) by mouth daily as needed (pain). (Patient not taking: Reported on 12/15/2016)    No facility-administered encounter medications on file as of 12/15/2016.     Allergies  Allergen Reactions  . Ibuprofen     Avoids due to Crohns disease  . Prednisone     "crazy", hallucinatory    ROS: The patient denies anorexia, fever, headaches, vision changes, decreased hearing, ear pain, sore throat, breast concerns, chest pain, palpitations, dizziness, syncope, dyspnea on exertion, cough, swelling, nausea, vomiting, indigestion/heartburn, hematuria, incontinence, dysuria, discharge, odor or itch, genital lesions, numbness, tingling, tremor, suspicious skin lesions, anxiety, abnormal bleeding/bruising (some bruising, "clumsy", unchanged, a little worse when she cut out red meat), or enlarged lymph nodes. Has Mirena, no menstrual bleeding. +weight loss since  restarting Weight Watchers. Crohn's is very well controlled--denies abdominal pain, diarrhea, blood in stool.  Had some loose stools, 3 weeks ago, responded to 2 weeks of Align probiotic. Chronic allergies, controlled by OTC meds. Switches between Allegra and Zyrtec Some pain in knees, hips, hands and wrists, mild. Rarely needs any medication  PHYSICAL EXAM:  Wt Readings from Last 3 Encounters:  10/07/16 202 lb 2 oz (91.7 kg)  08/02/16 202 lb 12.8 oz (92 kg)  05/20/16 197 lb 9.6 oz (89.6 kg)   BP 116/74 (BP Location: Left Arm, Patient Position: Sitting, Cuff Size: Normal)   Pulse 68   Ht _0  (1.626 m)   Wt 197 lb 9.6 oz (89.6 kg)   BMI 33.92 kg/m    General Appearance:   Alert, cooperative, no distress, appears stated age  Head:   Normocephalic, without obvious abnormality, atraumatic  Eyes:   PERRL, conjunctiva/corneas clear, EOM's intact, fundi   benign  Ears:   Normal TM's and external ear canals  Nose:  Nares normal, mucosa mildly edematou, no drainage or sinus tenderness  Throat:  Lips, mucosa, and tongue normal; teeth and gums normal  Neck:  Supple, no lymphadenopathy; thyroid: no enlargement/tenderness/nodules; no carotid bruit or JVD  Back:  Spine nontender, no curvature, ROM normal, no CVA tenderness.  Lungs:   Clear to auscultation bilaterally without wheezes, rales or ronchi; respirations unlabored  Chest Wall:   No tenderness or deformity  Heart:   Regular rate and rhythm, S1 and S2 normal, no murmur, rub  or gallop  Breast Exam:   Deferred to GYN  Abdomen:   Soft, non-tender, nondistended, normoactive bowel sounds,   no masses, no hepatosplenomegaly  Genitalia:  deferred to GYN  Rectal:   deferred  Extremities:  No clubbing, cyanosis or edema  Pulses:  2+ and symmetric all extremities  Skin:  Skin color, texture, turgor normal, no rashes or lesions  Lymph nodes:  Cervical,  supraclavicular, and axillary nodes normal  Neurologic:  CNII-XII intact, normal strength, sensation and gait; reflexes 2+ and symmetric throughout.     Psych: Normal mood, affect, hygiene and grooming  Lab Results  Component Value Date  CHOL 136 12/14/2016   HDL 63 12/14/2016   LDLCALC 50 12/14/2016   TRIG 115 12/14/2016   CHOLHDL 2.2 12/14/2016   Vitamin D-OH 28 Fasting glucose 81  ASSESSMENT/PLAN:  Annual physical exam - Plan: POCT Urinalysis Dipstick, Visual acuity screening  Vitamin D deficiency - improved; still slightly low. Add 1000 IU of OTC Vtamin D3 daily  Hypertriglyceridemia - well controlled; continue fenofibrate - Plan: fenofibrate (TRICOR) 145 MG tablet  Depression, major, in remission ([place]) - pt to stay on 7.73m dose  Cro[place] of [place]   Discussed monthly self breast exams and yearly mammograms after the age of 89(she had baseline mammo at age 41; at least 30 minutes of aerobic activity at least 5 days/week including weight-bearing exercise at least twice per week; proper sunscreen use reviewed; healthy diet, including goals of calcium and vitamin D intake and alcohol recommendations (less than or equal to 1 drink/day) reviewed; regular seatbelt use; changing batteries in smoke detectors. Immunization recommendations discussed--UTD; continue yearly flu shots. Colonoscopy recommendations reviewed--per Dr. GCarlean Purl  Discussed obesity, risks. Encouraged continued weight loss--healthy food choices, portion control, and continued regular exercise.  F/u 1 year, sooner prn

## 2016-12-15 ENCOUNTER — Encounter: Payer: Self-pay | Admitting: Family Medicine

## 2016-12-15 ENCOUNTER — Ambulatory Visit (INDEPENDENT_AMBULATORY_CARE_PROVIDER_SITE_OTHER): Payer: 59 | Admitting: Family Medicine

## 2016-12-15 VITALS — BP 116/74 | HR 68 | Ht 64.0 in | Wt 197.6 lb

## 2016-12-15 DIAGNOSIS — E559 Vitamin D deficiency, unspecified: Secondary | ICD-10-CM | POA: Diagnosis not present

## 2016-12-15 DIAGNOSIS — F325 Major depressive disorder, single episode, in full remission: Secondary | ICD-10-CM | POA: Diagnosis not present

## 2016-12-15 DIAGNOSIS — E781 Pure hyperglyceridemia: Secondary | ICD-10-CM | POA: Diagnosis not present

## 2016-12-15 DIAGNOSIS — K5000 Crohn's disease of small intestine without complications: Secondary | ICD-10-CM | POA: Diagnosis not present

## 2016-12-15 DIAGNOSIS — Z0000 Encounter for general adult medical examination without abnormal findings: Secondary | ICD-10-CM

## 2016-12-15 LAB — POCT URINALYSIS DIPSTICK
BILIRUBIN UA: NEGATIVE
Blood, UA: NEGATIVE
Glucose, UA: NEGATIVE
Ketones, UA: NEGATIVE
LEUKOCYTES UA: NEGATIVE
NITRITE UA: NEGATIVE
Protein, UA: NEGATIVE
Spec Grav, UA: 1.015
Urobilinogen, UA: NEGATIVE
pH, UA: 6

## 2016-12-15 LAB — VITAMIN D 25 HYDROXY (VIT D DEFICIENCY, FRACTURES): VIT D 25 HYDROXY: 28 ng/mL — AB (ref 30–100)

## 2016-12-15 MED ORDER — FENOFIBRATE 145 MG PO TABS: 145.0000 mg | ORAL_TABLET | Freq: Every day | ORAL | 11 refills | Status: DC

## 2016-12-15 NOTE — Patient Instructions (Signed)
  HEALTH MAINTENANCE RECOMMENDATIONS:  It is recommended that you get at least 30 minutes of aerobic exercise at least 5 days/week (for weight loss, you may need as much as 60-90 minutes). This can be any activity that gets your heart rate up. This can be divided in 10-15 minute intervals if needed, but try and build up your endurance at least once a week.  Weight bearing exercise is also recommended twice weekly.  Eat a healthy diet with lots of vegetables, fruits and fiber.  "Colorful" foods have a lot of vitamins (ie green vegetables, tomatoes, red peppers, etc).  Limit sweet tea, regular sodas and alcoholic beverages, all of which has a lot of calories and sugar.  Up to 1 alcoholic drink daily may be beneficial for women (unless trying to lose weight, watch sugars).  Drink a lot of water.  Calcium recommendations are 1200-1500 mg daily (1500 mg for postmenopausal women or women without ovaries), and vitamin D 1000 IU daily.  This should be obtained from diet and/or supplements (vitamins), and calcium should not be taken all at once, but in divided doses.  Monthly self breast exams and yearly mammograms for women over the age of 73 is recommended.  Sunscreen of at least SPF 30 should be used on all sun-exposed parts of the skin when outside between the hours of 10 am and 4 pm (not just when at beach or pool, but even with exercise, golf, tennis, and yard work!)  Use a sunscreen that says "broad spectrum" so it covers both UVA and UVB rays, and make sure to reapply every 1-2 hours.  Remember to change the batteries in your smoke detectors when changing your clock times in the spring and fall.  Use your seat belt every time you are in a car, and please drive safely and not be distracted with cell phones and texting while driving.  Your vitamin D level was just a little low, meaning that your viactiv and diet isn't providing quite enough. Start taking either a multivitamin with 1000 IU of D, or a  separate vitamin D3 1000 IU daily.

## 2016-12-17 ENCOUNTER — Other Ambulatory Visit: Payer: Self-pay | Admitting: Family Medicine

## 2016-12-17 ENCOUNTER — Other Ambulatory Visit: Payer: Self-pay | Admitting: Internal Medicine

## 2016-12-17 DIAGNOSIS — F325 Major depressive disorder, single episode, in full remission: Secondary | ICD-10-CM

## 2016-12-17 NOTE — Telephone Encounter (Signed)
Is this okay to refill?. Looks like in your notes she is taking 7.47m

## 2017-01-17 ENCOUNTER — Other Ambulatory Visit: Payer: Self-pay | Admitting: *Deleted

## 2017-01-17 ENCOUNTER — Encounter: Payer: Self-pay | Admitting: Family Medicine

## 2017-01-17 MED ORDER — FENOFIBRATE 160 MG PO TABS: 160.0000 mg | ORAL_TABLET | Freq: Every day | ORAL | 10 refills | Status: DC

## 2017-01-19 NOTE — Telephone Encounter (Signed)
This encounter was created in error - please disregard.

## 2017-01-20 ENCOUNTER — Telehealth: Payer: Self-pay | Admitting: Family Medicine

## 2017-01-20 NOTE — Telephone Encounter (Signed)
This should have already been handled on 3/19.  Verify with pharm?

## 2017-01-20 NOTE — Telephone Encounter (Signed)
Rcvd not stating that insurance will not cove the strength on Fenofibrate 145 mg. Pharmacy requesting alternate. Suggested alternates are 54 mg or 160 mg

## 2017-01-20 NOTE — Telephone Encounter (Signed)
Yes already handled and spoke with pharmacist.

## 2017-01-24 NOTE — Progress Notes (Signed)
41 y.o. G42P0000 Married Caucasian female here for annual exam.    Having more night sweats.  Not disruptive.  No hot flashes during the day.  No menses for 6 years.   Doing Weight Watchers and losing weight slowly.   Labs with PCP.   Got married in December.  PCP:  Dr. Tomi Bamberger   No LMP recorded. Patient is not currently having periods (Reason: IUD).           Sexually active: Yes.    The current method of family planning is IUD--Mirena inserted 02-12-16.    Exercising: Yes.    Walking, strength training Smoker:  no  Health Maintenance: Pap: 01-13-16 Neg:Neg HR HPV;10-03-13 Neg:Neg HR HPV History of abnormal Pap:  Yes, Hx LEEP 2009.  Follow up paps were normal following LEEP. MMG:  01-27-16 Diag. Bil.MMG with Rt.Br.US;Density B/Neg/BiRads1/screening age 61/TBC Colonoscopy: 03-03-09--Hx Crohns Disease--spotting erosions and ulcerations in the ileum;otherwise normal:done in [place] BMD: 10/2016 Result: Osteopenia with [place] TDaP:  12-15-15 Gardasil:   N/A HIV: Yes Hep C: Yes Screening Labs: PCP  Hb today: PCP, Urine today: PCP   reports that she quit smoking about 18 years ago. She has a 4.00 pack-year smoking history. She has never used smokeless tobacco. She reports that she does not drink alcohol or use drugs.  Past Medical History:  Diagnosis Date  . Anemia    related to Crohns flares  . Anxiety   . Arthritis    knees, feet, hands, wrists, related to Crohns flare  . Asthma    Childhood  . B12 deficiency    monitored/treated by Dr. Carlean Purl  . Cervical dysplasia   . Crohn's disease of small intestine ([place]) 06/24/2012   Diagnosed 1999, in [place]. Ileitis only then. Treated with Imuran Remicade and prednisone.  Noncompliant with therapy. 2004 return to care and was treated with Remicade prednisone Pentasa Cipro and Flagyl. 2006 status post right hemicolectomy. Subsequently treated with azathioprine and Cimzia. 200 mg every other week.  azathioprine was added in 2010.  Prometheus TP MT enzyme was negative. Has ha  . Depression   . Eczema    arms and behind knees, worse in winter  . History of recurrent UTI (urinary tract infection)   . HPV (human papilloma virus) infection   . Hyperlipemia   . Osteopenia of femur neck T. score -1.4 09/29/2011   09/2013 T-1.6  . Papanicolaou smear 12/12   last abnormal 2011  . Scaphoid fracture of wrist 2013   left  . Seasonal allergic rhinitis   . Shingles 09/2015   R hip  . Wears glasses     Past Surgical History:  Procedure Laterality Date  . APPENDECTOMY    . CERVICAL BIOPSY  W/ LOOP ELECTRODE EXCISION  2009   ---paps normal since  . COLONOSCOPY  multiple   scanned  . ESOPHAGOGASTRODUODENOSCOPY  multiple   scanned  . HEMICOLECTOMY  2006  . LEEP  2011   --done in [place]  . WISDOM TOOTH EXTRACTION      Current Outpatient Prescriptions  Medication Sig Dispense Refill  . acetaminophen (TYLENOL) 500 MG tablet Take 1,000 mg by mouth every 6 (six) hours as needed. Reported on 12/15/2015    . azaTHIOprine (IMURAN) 50 MG tablet TAKE 3 TABLETS BY MOUTH EVERY DAY 90 tablet 1  . buPROPion (WELLBUTRIN SR) 100 MG 12 hr tablet Take 1 tablet (100 mg total) by mouth 2 (two) times daily. 180 tablet 3  . Calcium-Vitamin D-Vitamin K (CALCIUM SOFT  CHEWS) 500-100-40 MG-UNT-MCG CHEW Chew 1 tablet by mouth 2 (two) times daily.    . Certolizumab Pegol (CIMZIA PREFILLED) 2 X 200 MG/ML KIT Inject 200 mg into the skin every 14 (fourteen) days. 1 kit 6  . cyanocobalamin (,VITAMIN B-12,) 1000 MCG/ML injection INJECT AS DIRECTED EVERY 30 DAYS 3 mL 11  . diphenhydrAMINE (SOMINEX) 25 MG tablet Take 25 mg by mouth at bedtime as needed for sleep.    . diphenoxylate-atropine (LOMOTIL) 2.5-0.025 MG per tablet Take 1 tablet by mouth 4 (four) times daily as needed for diarrhea or loose stools. 120 tablet 0  . escitalopram (LEXAPRO) 10 MG tablet TAKE 1 TABLET (10 MG TOTAL) BY MOUTH DAILY. 90 tablet 3  . fenofibrate 160 MG tablet Take 1  tablet (160 mg total) by mouth daily. 30 tablet 10  . fexofenadine (ALLEGRA) 180 MG tablet Take 180 mg by mouth daily.    Marland Kitchen levonorgestrel (MIRENA) 20 MCG/24HR IUD 1 each by Intrauterine route once.    . Omega-3 Fatty Acids (FISH OIL) 1000 MG CAPS Take 2 capsules by mouth daily.     No current facility-administered medications for this visit.     Family History  Problem Relation Age of Onset  . Hypertension Mother   . Breast cancer Mother 53  . Colon polyps Father   . Diabetes Father     borderline  . Hypertension Father   . Hyperlipidemia Father   . Colon cancer Paternal Grandfather   . Multiple sclerosis Sister   . Alcohol abuse Sister   . Stroke Maternal Grandmother   . Lung cancer Maternal Grandmother   . Hypertension Maternal Grandmother   . Heart disease Paternal Grandmother   . Hypertension Paternal Grandmother     ROS:  Pertinent items are noted in HPI.  Otherwise, a comprehensive ROS was negative.  Exam:   BP 110/72 (BP Location: Right Arm, Patient Position: Sitting, Cuff Size: Normal)   Pulse 60   Resp 12   Ht 5' 3.5" (1.613 m)   Wt 199 lb 12.8 oz (90.6 kg)   BMI 34.84 kg/m     General appearance: alert, cooperative and appears stated age Head: Normocephalic, without obvious abnormality, atraumatic Neck: no adenopathy, supple, symmetrical, trachea midline and thyroid normal to inspection and palpation Lungs: clear to auscultation bilaterally Breasts: normal appearance, no masses or tenderness, No nipple retraction or dimpling, No nipple discharge or bleeding, No axillary or supraclavicular adenopathy Heart: regular rate and rhythm Abdomen: soft, non-tender; no masses, no organomegaly Extremities: extremities normal, atraumatic, no cyanosis or edema Skin: Skin color, texture, turgor normal. No rashes or lesions Lymph nodes: Cervical, supraclavicular, and axillary nodes normal. No abnormal inguinal nodes palpated Neurologic: Grossly normal  Pelvic: External  genitalia:  no lesions              Urethra:  normal appearing urethra with no masses, tenderness or lesions              Bartholins and Skenes: normal                 Vagina: normal appearing vagina with normal color and discharge, no lesions              Cervix: no lesions.  IUD string noted.              Pap taken: No. Bimanual Exam:  Uterus:  normal size, contour, position, consistency, mobility, non-tender  Adnexa: no mass, fullness, tenderness            Chaperone was present for exam.  Assessment:   Well woman visit with normal exam. Mirena IUD. Night sweats.  Hx LEEP. Hx osteopenia.  FH breast cancer.  Mother.  Crohn's.  Plan: Mammogram screening discussed.  Mammogram age 52. Recommended self breast awareness. Pap and HR HPV as above. Guidelines for Calcium, Vitamin D, regular exercise program including cardiovascular and weight bearing exercise. Weight loss encouraged.  Declines FSH.  She will watch for signs of menopause.  Follow up annually and prn.    After visit summary provided.

## 2017-01-26 ENCOUNTER — Ambulatory Visit (INDEPENDENT_AMBULATORY_CARE_PROVIDER_SITE_OTHER): Payer: 59 | Admitting: Obstetrics and Gynecology

## 2017-01-26 ENCOUNTER — Encounter: Payer: Self-pay | Admitting: Obstetrics and Gynecology

## 2017-01-26 VITALS — BP 110/72 | HR 60 | Resp 12 | Ht 63.5 in | Wt 199.8 lb

## 2017-01-26 DIAGNOSIS — Z01419 Encounter for gynecological examination (general) (routine) without abnormal findings: Secondary | ICD-10-CM | POA: Diagnosis not present

## 2017-01-26 NOTE — Patient Instructions (Signed)

## 2017-01-31 ENCOUNTER — Other Ambulatory Visit: Payer: Self-pay

## 2017-02-10 ENCOUNTER — Encounter: Payer: Self-pay | Admitting: Family Medicine

## 2017-02-10 DIAGNOSIS — Z87898 Personal history of other specified conditions: Secondary | ICD-10-CM

## 2017-02-11 MED ORDER — SCOPOLAMINE 1 MG/3DAYS TD PT72: 1.0000 | MEDICATED_PATCH | TRANSDERMAL | 0 refills | Status: DC

## 2017-02-14 ENCOUNTER — Other Ambulatory Visit: Payer: Self-pay | Admitting: Family Medicine

## 2017-02-14 DIAGNOSIS — F325 Major depressive disorder, single episode, in full remission: Secondary | ICD-10-CM

## 2017-02-16 ENCOUNTER — Other Ambulatory Visit: Payer: Self-pay | Admitting: Internal Medicine

## 2017-03-02 ENCOUNTER — Other Ambulatory Visit: Payer: Self-pay | Admitting: Internal Medicine

## 2017-03-02 NOTE — Telephone Encounter (Signed)
Refill x 1 yr If needs syringes ok too

## 2017-03-02 NOTE — Telephone Encounter (Signed)
Please advise, seen in December 2017. Thank you Sir.

## 2017-03-14 ENCOUNTER — Encounter: Payer: Self-pay | Admitting: Family Medicine

## 2017-03-16 ENCOUNTER — Ambulatory Visit (INDEPENDENT_AMBULATORY_CARE_PROVIDER_SITE_OTHER): Payer: 59 | Admitting: Family Medicine

## 2017-03-16 ENCOUNTER — Encounter: Payer: Self-pay | Admitting: Family Medicine

## 2017-03-16 VITALS — BP 112/72 | HR 84 | Temp 99.4°F | Ht 63.5 in | Wt 202.4 lb

## 2017-03-16 DIAGNOSIS — J019 Acute sinusitis, unspecified: Secondary | ICD-10-CM

## 2017-03-16 DIAGNOSIS — D849 Immunodeficiency, unspecified: Secondary | ICD-10-CM

## 2017-03-16 DIAGNOSIS — R05 Cough: Secondary | ICD-10-CM

## 2017-03-16 DIAGNOSIS — D899 Disorder involving the immune mechanism, unspecified: Secondary | ICD-10-CM

## 2017-03-16 DIAGNOSIS — R059 Cough, unspecified: Secondary | ICD-10-CM

## 2017-03-16 MED ORDER — ALBUTEROL SULFATE HFA 108 (90 BASE) MCG/ACT IN AERS: 2.0000 | INHALATION_SPRAY | Freq: Four times a day (QID) | RESPIRATORY_TRACT | 0 refills | Status: DC | PRN

## 2017-03-16 MED ORDER — AMOXICILLIN-POT CLAVULANATE 875-125 MG PO TABS: 1.0000 | ORAL_TABLET | Freq: Two times a day (BID) | ORAL | 0 refills | Status: DC

## 2017-03-16 MED ORDER — BENZONATATE 200 MG PO CAPS: 200.0000 mg | ORAL_CAPSULE | Freq: Three times a day (TID) | ORAL | 0 refills | Status: DC | PRN

## 2017-03-16 NOTE — Patient Instructions (Signed)
  Drink plenty of water. Use the albuterol inhaler when having chest tightness, shortness of breath, or spasms of dry cough.   Use the benzonatate as needed for cough. Take the antibiotic as directed. Continue your probiotics, as the antibiotic may contribute to your diarrhea. You may continue to use Mucinex as needed. This helps keep the phlegm thin (guaifenesin is an expectorant). You may continue to use dayquil/nyquil if needed. Be sure not to overlap ingredients from over-the-counter medications
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
## 2017-03-22 ENCOUNTER — Other Ambulatory Visit (INDEPENDENT_AMBULATORY_CARE_PROVIDER_SITE_OTHER): Payer: 59

## 2017-03-22 DIAGNOSIS — Z79899 Other long term (current) drug therapy: Secondary | ICD-10-CM

## 2017-03-22 LAB — CBC WITH DIFFERENTIAL/PLATELET
Basophils Absolute: 0 10*3/uL (ref 0.0–0.1)
Basophils Relative: 0.3 % (ref 0.0–3.0)
EOS PCT: 0.9 % (ref 0.0–5.0)
Eosinophils Absolute: 0.1 10*3/uL (ref 0.0–0.7)
HEMATOCRIT: 34.2 % — AB (ref 36.0–46.0)
HEMOGLOBIN: 11.5 g/dL — AB (ref 12.0–15.0)
LYMPHS PCT: 15 % (ref 12.0–46.0)
Lymphs Abs: 1.6 10*3/uL (ref 0.7–4.0)
MCHC: 33.6 g/dL (ref 30.0–36.0)
MCV: 94.4 fl (ref 78.0–100.0)
MONOS PCT: 9.8 % (ref 3.0–12.0)
Monocytes Absolute: 1 10*3/uL (ref 0.1–1.0)
Neutro Abs: 7.9 10*3/uL — ABNORMAL HIGH (ref 1.4–7.7)
Neutrophils Relative %: 74 % (ref 43.0–77.0)
Platelets: 528 10*3/uL — ABNORMAL HIGH (ref 150.0–400.0)
RBC: 3.62 Mil/uL — AB (ref 3.87–5.11)
RDW: 14.7 % (ref 11.5–15.5)
WBC: 10.6 10*3/uL — AB (ref 4.0–10.5)

## 2017-03-22 LAB — HEPATIC FUNCTION PANEL
ALT: 23 U/L (ref 0–35)
AST: 34 U/L (ref 0–37)
Albumin: 3.7 g/dL (ref 3.5–5.2)
Alkaline Phosphatase: 52 U/L (ref 39–117)
BILIRUBIN TOTAL: 0.3 mg/dL (ref 0.2–1.2)
Bilirubin, Direct: 0.1 mg/dL (ref 0.0–0.3)
TOTAL PROTEIN: 6.9 g/dL (ref 6.0–8.3)

## 2017-03-24 ENCOUNTER — Other Ambulatory Visit: Payer: Self-pay

## 2017-03-24 DIAGNOSIS — Z79899 Other long term (current) drug therapy: Secondary | ICD-10-CM

## 2017-03-24 NOTE — Progress Notes (Signed)
Slight decline in hemoglobin not a problem everythig else ok Repeat same labs 4 mos She should see me this summer Also I recommend she have an annual skin exam if not doing usually from a dermatologist since on azathioprine and slight increase risk in skin cancers - alternative is to have Dr. Tomi Bamberger do full skin exam - we can review more at f/u

## 2017-03-25 ENCOUNTER — Encounter: Payer: Self-pay | Admitting: Family Medicine

## 2017-04-26 ENCOUNTER — Other Ambulatory Visit: Payer: Self-pay | Admitting: Internal Medicine

## 2017-05-11 ENCOUNTER — Encounter: Payer: Self-pay | Admitting: Internal Medicine

## 2017-05-11 ENCOUNTER — Telehealth: Payer: Self-pay | Admitting: Internal Medicine

## 2017-05-11 MED ORDER — CELECOXIB 200 MG PO CAPS: 200.0000 mg | ORAL_CAPSULE | Freq: Two times a day (BID) | ORAL | 1 refills | Status: DC | PRN

## 2017-05-11 NOTE — Telephone Encounter (Signed)
celebrex info

## 2017-06-03 ENCOUNTER — Other Ambulatory Visit (INDEPENDENT_AMBULATORY_CARE_PROVIDER_SITE_OTHER): Payer: 59

## 2017-06-03 ENCOUNTER — Encounter: Payer: Self-pay | Admitting: Internal Medicine

## 2017-06-03 ENCOUNTER — Ambulatory Visit (INDEPENDENT_AMBULATORY_CARE_PROVIDER_SITE_OTHER): Payer: 59 | Admitting: Internal Medicine

## 2017-06-03 DIAGNOSIS — K50011 Crohn's disease of small intestine with rectal bleeding: Secondary | ICD-10-CM

## 2017-06-03 LAB — COMPREHENSIVE METABOLIC PANEL
ALK PHOS: 31 U/L — AB (ref 39–117)
ALT: 8 U/L (ref 0–35)
AST: 14 U/L (ref 0–37)
Albumin: 3.5 g/dL (ref 3.5–5.2)
BUN: 11 mg/dL (ref 6–23)
CALCIUM: 8.2 mg/dL — AB (ref 8.4–10.5)
CO2: 28 mEq/L (ref 19–32)
Chloride: 105 mEq/L (ref 96–112)
Creatinine, Ser: 0.95 mg/dL (ref 0.40–1.20)
GFR: 69.47 mL/min (ref >60.00)
GLUCOSE: 78 mg/dL (ref 70–99)
POTASSIUM: 4.1 meq/L (ref 3.5–5.1)
Sodium: 140 mEq/L (ref 135–145)
TOTAL PROTEIN: 6.6 g/dL (ref 6.0–8.3)
Total Bilirubin: 0.2 mg/dL (ref 0.2–1.2)

## 2017-06-03 LAB — CBC WITH DIFFERENTIAL/PLATELET
Basophils Absolute: 0 10*3/uL (ref 0.0–0.1)
Basophils Relative: 0.5 % (ref 0.0–3.0)
Eosinophils Absolute: 0.1 10*3/uL (ref 0.0–0.7)
Eosinophils Relative: 1.2 % (ref 0.0–5.0)
HEMATOCRIT: 34.6 % — AB (ref 36.0–46.0)
Hemoglobin: 11.2 g/dL — ABNORMAL LOW (ref 12.0–15.0)
LYMPHS ABS: 1.4 10*3/uL (ref 0.7–4.0)
Lymphocytes Relative: 18.3 % (ref 12.0–46.0)
MCHC: 32.4 g/dL (ref 30.0–36.0)
MCV: 95.1 fl (ref 78.0–100.0)
Monocytes Absolute: 0.9 10*3/uL (ref 0.1–1.0)
Monocytes Relative: 11.4 % (ref 3.0–12.0)
NEUTROS PCT: 68.6 % (ref 43.0–77.0)
Neutro Abs: 5.3 10*3/uL (ref 1.4–7.7)
Platelets: 477 10*3/uL — ABNORMAL HIGH (ref 150.0–400.0)
RBC: 3.64 Mil/uL — ABNORMAL LOW (ref 3.87–5.11)
RDW: 15.5 % (ref 11.5–15.5)
WBC: 7.8 10*3/uL (ref 4.0–10.5)

## 2017-06-03 LAB — C-REACTIVE PROTEIN: CRP: 0.1 mg/dL — AB (ref 0.5–20.0)

## 2017-06-03 LAB — SEDIMENTATION RATE: SED RATE: 15 mm/h (ref 0–20)

## 2017-06-03 NOTE — Assessment & Plan Note (Signed)
No signs toxicity

## 2017-06-03 NOTE — Progress Notes (Signed)
Lindsay Mccarthy 41 y.o. [birth year] [id number]  Assessment & Plan:   Encounter Diagnosis  Name Primary?  . Crohn's disease of small intestine with rectal bleeding ([place])     I think she is probably having a Crohn's flare. C. difficile is possible since she had Augmentin in May. However she's having joint symptoms diarrhea she had a stenotic tender rectal exam also. I'm going to work her up with a C. difficile PCR, CBC, cemented C-reactive protein and a sedimentation rate. I will also check thiopurine metabolites to see if she has adequate levels of azathioprine. She's had problems with side effects from steroids and would not be interested in using those. If the C. difficile is negative I think a colonoscopy as the next step.  I appreciate the opportunity to care for this patient. CC: Rita Ohara, MD   Subjective:   Chief Complaint:Diarrhea rectal bleeding  HPI Lindsay Mccarthy is here. Over the years she has been extremely stable without problems with her Crohn's but in the past couple of months she has developed some intermittent crampy right lower quadrant pain, borborygmi, and diarrhea that is mostly watery. She says at best it is pudding consistency. She's had intermittent bright red rectal bleeding, she says in the past she has been told she has hemorrhoids. Her last colonoscopy was in 2010 before she moved here from [place] and she had some ileal ulcerations then. Since being on azathioprine and Cimzia around that time she has been very stable. She had messaged me not too long ago asking about using Celebrex instead of ibuprofen as she had been having joint problems. She attributed that to the human weather. Since changing from ibuprofen to Celebrex she thinks the rectal bleeding is much less. Sometimes she'll feel pain in the right lower quadrant and massage that area and feel like things pop and then there are bowel sounds. Then she'll move her bowels. She has not had any fecal  incontinence. Fortunately she works from home and is close to a bathroom. Allergies  Allergen Reactions  . Ibuprofen     Avoids due to Crohns disease  . Prednisone     "crazy", hallucinatory   Current Meds  Medication Sig  . acetaminophen (TYLENOL) 500 MG tablet Take 1,000 mg by mouth every 6 (six) hours as needed. Reported on 12/15/2015  . azaTHIOprine (IMURAN) 50 MG tablet TAKE 3 TABLETS BY MOUTH EVERY DAY  . buPROPion (WELLBUTRIN SR) 100 MG 12 hr tablet TAKE 1 TABLET (100 MG TOTAL) BY MOUTH 2 (TWO) TIMES DAILY.  . Calcium-Vitamin D-Vitamin K (CALCIUM SOFT CHEWS) 500-100-40 MG-UNT-MCG CHEW Chew 1 tablet by mouth 2 (two) times daily.  . celecoxib (CELEBREX) 200 MG capsule Take 1 capsule (200 mg total) by mouth 2 (two) times daily as needed.  . Certolizumab Pegol (CIMZIA PREFILLED) 2 X 200 MG/ML KIT Inject 200 mg into the skin every 14 (fourteen) days.  . cetirizine (ZYRTEC) 10 MG tablet Take 10 mg by mouth daily.  . cyanocobalamin (,VITAMIN B-12,) 1000 MCG/ML injection INJECT AS DIRECTED EVERY 30 DAYS  . diphenhydrAMINE (SOMINEX) 25 MG tablet Take 25 mg by mouth at bedtime as needed for sleep.  Marland Kitchen escitalopram (LEXAPRO) 10 MG tablet TAKE 1 TABLET (10 MG TOTAL) BY MOUTH DAILY.  . fenofibrate 160 MG tablet Take 1 tablet (160 mg total) by mouth daily.  Marland Kitchen levonorgestrel (MIRENA) 20 MCG/24HR IUD 1 each by Intrauterine route once.  . Omega-3 Fatty Acids (FISH OIL) 1000 MG CAPS Take 2 capsules  by mouth daily.   Past Medical History:  Diagnosis Date  . Anemia    related to Crohns flares  . Anxiety   . Arthritis    knees, feet, hands, wrists, related to Crohns flare  . Asthma    Childhood  . B12 deficiency    monitored/treated by Dr. Carlean Purl  . Cervical dysplasia   . Crohn's disease of small intestine ([place]) 06/24/2012   Diagnosed 1999, in [place]. Ileitis only then. Treated with Imuran Remicade and prednisone.  Noncompliant with therapy. 2004 return to care and was treated with Remicade  prednisone Pentasa Cipro and Flagyl. 2006 status post right hemicolectomy. Subsequently treated with azathioprine and Cimzia. 200 mg every other week.  azathioprine was added in 2010. Prometheus TP MT enzyme was negative. Has ha  . Depression   . Eczema    arms and behind knees, worse in winter  . History of recurrent UTI (urinary tract infection)   . HPV (human papilloma virus) infection   . Hyperlipemia   . Osteopenia of femur neck T. score -1.4 09/29/2011   09/2013 T-1.6  . Papanicolaou smear 12/12   last abnormal 2011  . Scaphoid fracture of wrist 2013   left  . Seasonal allergic rhinitis   . Shingles 09/2015   R hip  . Wears glasses    Past Surgical History:  Procedure Laterality Date  . APPENDECTOMY    . CERVICAL BIOPSY  W/ LOOP ELECTRODE EXCISION  2009   ---paps normal since  . COLONOSCOPY  multiple   scanned  . ESOPHAGOGASTRODUODENOSCOPY  multiple   scanned  . HEMICOLECTOMY  2006  . LEEP  2011   --done in [place]  . WISDOM TOOTH EXTRACTION     Social History   Social History  . Marital status: Married    Spouse name: N/A  . Number of children: 0  . Years of education: N/A   Occupational History  . Quality Analyst    Social History Main Topics  . Smoking status: Former Smoker    Packs/day: 1.00    Years: 4.00    Quit date: 11/18/1998  . Smokeless tobacco: Never Used  . Alcohol use No  . Drug use: No  . Sexual activity: Yes    Partners: Male    Birth control/ protection: IUD     Comment: Mirena inserted 01/2016   Other Topics Concern  . Not on file   Social History Narrative   The patient is divorced.     Re-married Marcello Moores, partner of 10 years, on 10/10/16. 1 cats, 1 dog   No children - doesn't want any   [place].   Moved from [place] to [place] in 2013.   Past smoker   No alcohol   2 caffeinated beverages a day   She reports she is compliant with sunscreen given her increased risk of sun damage and skin cancer  on azathioprine      Updated 12/15/16   family history includes Alcohol abuse in her sister; Bone cancer in her mother; Breast cancer (age of onset: 40) in her mother; Colon cancer in her paternal grandfather; Colon polyps in her father; Diabetes in her father; Heart disease in her paternal grandmother; Hyperlipidemia in her father; Hypertension in her father, maternal grandmother, mother, and paternal grandmother; Lung cancer in her maternal grandmother; Multiple sclerosis in her sister; Stroke in her maternal grandmother.   Review of Systems As per history of present illness  Objective:   Physical  Exam BP 100/68   Pulse 80   Ht 5' 4"  (1.626 m)   Wt 213 lb (96.6 kg)   BMI 36.56 kg/m  Obese white woman pleasant and in no acute distress Eyes are anicteric The lungs are clear The heart sounds are normal no murmurs The abdomen is obese with some mild tenderness in the right lower quadrant area. There are well-healed surgical scars. Bowel sounds are present and seen normal now. Extremities show no pitting edema and no cyanosis or clubbing

## 2017-06-03 NOTE — Assessment & Plan Note (Signed)
?   Flare vs C diff

## 2017-06-03 NOTE — Patient Instructions (Addendum)
  Your physician has requested that you go to the basement for the following lab work before leaving today: CBC/diff, CMET, CRP, Sed Rate, C-Diff by PCR stool test and a thiopurine metabolites test   I appreciate the opportunity to care for you. Silvano Rusk, MD, [facility]

## 2017-06-04 LAB — CLOSTRIDIUM DIFFICILE BY PCR: Toxigenic C. Difficile by PCR: NOT DETECTED

## 2017-06-06 NOTE — Progress Notes (Signed)
C diff is negative so she needs a colonoscopy dx Crohn's ileitis with complication  Please arrange - I can do 8/22 0730 or she can have the 8/24 opening

## 2017-06-07 ENCOUNTER — Encounter: Payer: Self-pay | Admitting: Internal Medicine

## 2017-06-07 NOTE — Progress Notes (Signed)
Patient given results and recommendations. Scheduled  Procedure on 06/22/17 at 7:30 AM and previsit on 06/14/17 at 4:00 PM.

## 2017-06-10 LAB — SERIAL MONITORING

## 2017-06-11 LAB — THIOPURINE METABOLITES
6 MMPN METABOLITE: 586 pmol/8x 10E8
6 TGN METABOLITE: 235 pmol/8x 10E8

## 2017-06-12 NOTE — Progress Notes (Signed)
Levels ok My Chart message

## 2017-06-14 ENCOUNTER — Ambulatory Visit (AMBULATORY_SURGERY_CENTER): Payer: Self-pay | Admitting: *Deleted

## 2017-06-14 VITALS — Ht 64.0 in | Wt 209.0 lb

## 2017-06-14 DIAGNOSIS — K50011 Crohn's disease of small intestine with rectal bleeding: Secondary | ICD-10-CM

## 2017-06-14 NOTE — Progress Notes (Signed)
No egg or soy allergy known to patient  No issues with past sedation with any surgeries  or procedures, no intubation problems  No diet pills per patient No home 02 use per patient  No blood thinners per patient  Pt denies issues with constipation  No A fib or A flutter  EMMI video sent to pt's e mail

## 2017-06-15 ENCOUNTER — Encounter: Payer: Self-pay | Admitting: Internal Medicine

## 2017-06-21 ENCOUNTER — Other Ambulatory Visit: Payer: Self-pay | Admitting: Internal Medicine

## 2017-06-21 NOTE — Telephone Encounter (Signed)
How many refills Sir, thank you.

## 2017-06-21 NOTE — Telephone Encounter (Signed)
2 RF

## 2017-06-22 ENCOUNTER — Telehealth: Payer: Self-pay

## 2017-06-22 ENCOUNTER — Other Ambulatory Visit: Payer: Self-pay

## 2017-06-22 ENCOUNTER — Encounter: Payer: Self-pay | Admitting: Internal Medicine

## 2017-06-22 ENCOUNTER — Ambulatory Visit (AMBULATORY_SURGERY_CENTER): Payer: 59 | Admitting: Internal Medicine

## 2017-06-22 VITALS — BP 115/65 | HR 74 | Temp 97.7°F | Resp 19 | Ht 64.0 in | Wt 209.0 lb

## 2017-06-22 DIAGNOSIS — K50011 Crohn's disease of small intestine with rectal bleeding: Secondary | ICD-10-CM | POA: Diagnosis not present

## 2017-06-22 DIAGNOSIS — K50119 Crohn's disease of large intestine with unspecified complications: Secondary | ICD-10-CM | POA: Diagnosis present

## 2017-06-22 DIAGNOSIS — K9189 Other postprocedural complications and disorders of digestive system: Secondary | ICD-10-CM | POA: Diagnosis not present

## 2017-06-22 DIAGNOSIS — K50112 Crohn's disease of large intestine with intestinal obstruction: Secondary | ICD-10-CM

## 2017-06-22 DIAGNOSIS — K5150 Left sided colitis without complications: Secondary | ICD-10-CM | POA: Diagnosis not present

## 2017-06-22 MED ORDER — SODIUM CHLORIDE 0.9 % IV SOLN: 500.0000 mL | INTRAVENOUS | Status: DC

## 2017-06-22 NOTE — Patient Instructions (Addendum)
The junction of the small and large intestine is inflamed and narrowed.  I took biopsies of that. The rectum looked inflamed - could just be from the scope and the prep. I biopsied that and the normal colon, too.  Will discuss options soon.  I appreciate the opportunity to care for you. Gatha Mayer, MD, [facility]   Discharge instructions given. Biopsies taken. Resume previous medications. YOU HAD AN ENDOSCOPIC PROCEDURE TODAY AT [facility]:   Refer to the procedure report that was given to you for any specific questions about what was found during the examination.  If the procedure report does not answer your questions, please call your gastroenterologist to clarify.  If you requested that your care partner not be given the details of your procedure findings, then the procedure report has been included in a sealed envelope for you to review at your convenience later.  YOU SHOULD EXPECT: Some feelings of bloating in the abdomen. Passage of more gas than usual.  Walking can help get rid of the air that was put into your GI tract during the procedure and reduce the bloating. If you had a lower endoscopy (such as a colonoscopy or flexible sigmoidoscopy) you may notice spotting of blood in your stool or on the toilet paper. If you underwent a bowel prep for your procedure, you may not have a normal bowel movement for a few days.  Please Note:  You might notice some irritation and congestion in your nose or some drainage.  This is from the oxygen used during your procedure.  There is no need for concern and it should clear up in a day or so.  SYMPTOMS TO REPORT IMMEDIATELY:   Following lower endoscopy (colonoscopy or flexible sigmoidoscopy):  Excessive amounts of blood in the stool  Significant tenderness or worsening of abdominal pains  Swelling of the abdomen that is new, acute  Fever of 100F or higher   For urgent or emergent issues, a gastroenterologist can be  reached at any hour by calling [phone].   DIET:  We do recommend a small meal at first, but then you may proceed to your regular diet.  Drink plenty of fluids but you should avoid alcoholic beverages for 24 hours.  ACTIVITY:  You should plan to take it easy for the rest of today and you should NOT DRIVE or use heavy machinery until tomorrow (because of the sedation medicines used during the test).    FOLLOW UP: Our staff will call the number listed on your records the next business day following your procedure to check on you and address any questions or concerns that you may have regarding the information given to you following your procedure. If we do not reach you, we will leave a message.  However, if you are feeling well and you are not experiencing any problems, there is no need to return our call.  We will assume that you have returned to your regular daily activities without incident.  If any biopsies were taken you will be contacted by phone or by letter within the next 1-3 weeks.  Please call us at [phone] if you have not heard about the biopsies in 3 weeks.    SIGNATURES/CONFIDENTIALITY: You and/or your care partner have signed paperwork which will be entered into your electronic medical record.  These signatures attest to the fact that that the information above on your After Visit Summary has been reviewed and is understood.  Full  responsibility of the confidentiality of this discharge information lies with you and/or your care-partner.

## 2017-06-22 NOTE — Telephone Encounter (Signed)
Patient notified of the appt details and to be NPO after midnight

## 2017-06-22 NOTE — Progress Notes (Signed)
Report to PACU, RN, vss, BBS= Clear.  

## 2017-06-22 NOTE — Telephone Encounter (Signed)
MR is scheduled for 07/01/17 at [facility].  She needs to arrive at 7:00 am and MRI will be at 8:00  Left message for patient to call back

## 2017-06-22 NOTE — Progress Notes (Signed)
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

## 2017-06-22 NOTE — Progress Notes (Signed)
Called to room to assist during endoscopic procedure.  Patient ID and intended procedure confirmed with present staff. Received instructions for my participation in the procedure from the performing physician.  

## 2017-06-22 NOTE — Telephone Encounter (Signed)
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

## 2017-06-22 NOTE — Op Note (Signed)
[facility] Patient Name: Lindsay Mccarthy Procedure Date: 06/22/2017 7:37 AM MRN: [id number] Endoscopist: Gatha Mayer , MD Age: 41 Referring MD:  Date of Birth: Oct 13, 1976 Gender: Female Account #: [id number] Procedure:                Colonoscopy Indications:              Crohn's disease of the small bowel, Follow-up of                            Crohn's disease of the small bowel Medicines:                Propofol per Anesthesia, Monitored Anesthesia Care Procedure:                Pre-Anesthesia Assessment:                           - Prior to the procedure, a History and Physical                            was performed, and patient medications and                            allergies were reviewed. The patient's tolerance of                            previous anesthesia was also reviewed. The risks                            and benefits of the procedure and the sedation                            options and risks were discussed with the patient.                            All questions were answered, and informed consent                            was obtained. Prior Anticoagulants: The patient has                            taken no previous anticoagulant or antiplatelet                            agents. ASA Grade Assessment: II - A patient with                            mild systemic disease. After reviewing the risks                            and benefits, the patient was deemed in                            satisfactory condition to undergo the procedure.  After obtaining informed consent, the colonoscope                            was passed under direct vision. Throughout the                            procedure, the patient's blood pressure, pulse, and                            oxygen saturations were monitored continuously. The                            Colonoscope was introduced through the anus and   advanced to the the ileocolonic anastomosis. The                            colonoscopy was performed without difficulty. The                            patient tolerated the procedure well. The quality                            of the bowel preparation was excellent. The                            terminal ileum, the rectum and Ileocolonic                            anastomsis areas were photographed. Scope In: 7:42:11 AM Scope Out: 8:00:24 AM Scope Withdrawal Time: 0 hours 12 minutes 25 seconds  Total Procedure Duration: 0 hours 18 minutes 13 seconds  Findings:                 The perianal and digital rectal examinations were                            normal.                           There was evidence of a patent end-to-side                            ileo-colonic anastomosis found in the surgical                            anastomosis. This was characterized by edema,                            erosion, moderate stenosis and ulceration. This                            could not be traversed. Biopsies were taken with a                            cold forceps for histology. Verification of patient  identification for the specimen was done. Estimated                            blood loss was minimal.                           A localized area of mildly erythematous, eroded and                            granular mucosa was found in the distal rectum.                            Biopsies were taken with a cold forceps for                            histology. Verification of patient identification                            for the specimen was done. Estimated blood loss was                            minimal.                           The exam was otherwise without abnormality on                            direct and retroflexion views.                           Biopsies for histology were taken with a cold                            forceps from the left  colon for evaluation of                            microscopic colitis. Estimated blood loss was                            minimal. Complications:            No immediate complications. Estimated Blood Loss:     Estimated blood loss was minimal. Impression:               - Patent end-to-side ileo-colonic anastomosis,                            characterized by edema, erosion, ulceration and                            moderate stenosis. Biopsied.                           - Erythematous, eroded and granular mucosa in the  distal rectum. Biopsied.                           - The examination was otherwise normal on direct                            and retroflexion views.                           - Biopsies were taken with a cold forceps from the                            left colon for evaluation of microscopic colitis. Recommendation:           - Patient has a contact number available for                            emergencies. The signs and symptoms of potential                            delayed complications were discussed with the                            patient. Return to normal activities tomorrow.                            Written discharge instructions were provided to the                            patient.                           - Continue present medications.                           - Await pathology results.                           - Will discuss additional w/u, ? Try budesonide -                            think may need to change biologics                           - Patient has a contact number available for                            emergencies. The signs and symptoms of potential                            delayed complications were discussed with the                            patient. Return to normal activities tomorrow.  Written discharge instructions were provided to the                             patient.                           - Resume previous diet.                           - Repeat colonoscopy is recommended. The                            colonoscopy date will be determined after pathology                            results from today's exam become available for                            review. Gatha Mayer, MD 06/22/2017 8:11:56 AM This report has been signed electronically.

## 2017-06-23 ENCOUNTER — Telehealth: Payer: Self-pay | Admitting: *Deleted

## 2017-06-23 NOTE — Telephone Encounter (Signed)
  Follow up Call-  Call back number 06/22/2017  Post procedure Call Back phone  # [phone]  Permission to leave phone message Yes  Some recent data might be hidden    [facility]

## 2017-06-23 NOTE — Telephone Encounter (Signed)
  Follow up Call-  Call back number 06/22/2017  Post procedure Call Back phone  # [phone]  Permission to leave phone message Yes  Some recent data might be hidden    [facility]

## 2017-06-28 ENCOUNTER — Encounter: Payer: Self-pay | Admitting: Internal Medicine

## 2017-06-29 ENCOUNTER — Ambulatory Visit (HOSPITAL_COMMUNITY): Payer: 59

## 2017-07-01 ENCOUNTER — Encounter: Payer: Self-pay | Admitting: Internal Medicine

## 2017-07-01 ENCOUNTER — Ambulatory Visit (HOSPITAL_COMMUNITY)
Admission: RE | Admit: 2017-07-01 | Discharge: 2017-07-01 | Disposition: A | Discharge status: Home / Self Care | Payer: 59 | Source: Ambulatory Visit | Attending: Internal Medicine | Admitting: Internal Medicine

## 2017-07-01 DIAGNOSIS — K50112 Crohn's disease of large intestine with intestinal obstruction: Secondary | ICD-10-CM | POA: Diagnosis present

## 2017-07-01 MED ORDER — GADOBENATE DIMEGLUMINE 529 MG/ML IV SOLN
20.0000 mL | Freq: Once | INTRAVENOUS | Status: AC | PRN
  Administered 2017-07-01: 20 mL via INTRAVENOUS

## 2017-07-01 NOTE — Progress Notes (Signed)
My Chart note

## 2017-07-01 NOTE — Progress Notes (Signed)
No recall or letter Sending a My Chart message to patient about the results

## 2017-07-06 ENCOUNTER — Telehealth: Payer: Self-pay

## 2017-07-06 DIAGNOSIS — K50118 Crohn's disease of large intestine with other complication: Secondary | ICD-10-CM

## 2017-07-06 NOTE — Telephone Encounter (Signed)
Referral placed for [facility] Records faxed to [facility]. I spoke with Mardene Celeste and she is aware that she will be contacted by [facility] directly for an appt.

## 2017-07-06 NOTE — Telephone Encounter (Signed)
-----   Message from Gatha Mayer, MD sent at 07/06/2017  4:37 PM EDT ----- Regarding: Weyman Rodney Needs to start Entyvio due to failing Cimzia - Crohn's ileitis  Please refer to [facility] to start this (after we get preauth I suspect)

## 2017-07-07 ENCOUNTER — Telehealth: Payer: Self-pay

## 2017-07-07 NOTE — Telephone Encounter (Signed)
-----  Message from Darden Dates sent at 07/07/2017 11:51 AM EDT ----- Regarding: Weyman Rodney info Per Nicole Kindred @ [employer] [facility] would fall under deductible then co-ins. Ded 1350 (has already met) oop max 4050 Remaining 2170 Co-ins 80% No prior approval needed @ [place] facility. Pt can be scheduled right away. Ref# [id number]  If you have any questions, just let me know. Thanks, Amy

## 2017-07-07 NOTE — Telephone Encounter (Signed)
Referral is faxed

## 2017-07-11 ENCOUNTER — Ambulatory Visit (HOSPITAL_COMMUNITY)
Admission: RE | Admit: 2017-07-11 | Discharge: 2017-07-11 | Disposition: A | Discharge status: Home / Self Care | Payer: 59 | Source: Ambulatory Visit | Attending: Family Medicine | Admitting: Family Medicine

## 2017-07-11 ENCOUNTER — Telehealth: Payer: Self-pay

## 2017-07-11 ENCOUNTER — Encounter: Payer: Self-pay | Admitting: Family Medicine

## 2017-07-11 ENCOUNTER — Ambulatory Visit (INDEPENDENT_AMBULATORY_CARE_PROVIDER_SITE_OTHER): Payer: 59 | Admitting: Family Medicine

## 2017-07-11 VITALS — BP 102/60 | HR 87 | Resp 16 | Wt 210.4 lb

## 2017-07-11 DIAGNOSIS — Z79899 Other long term (current) drug therapy: Secondary | ICD-10-CM

## 2017-07-11 DIAGNOSIS — M79662 Pain in left lower leg: Secondary | ICD-10-CM

## 2017-07-11 DIAGNOSIS — Z7960 Long term (current) use of unspecified immunomodulators and immunosuppressants: Secondary | ICD-10-CM

## 2017-07-11 DIAGNOSIS — K50011 Crohn's disease of small intestine with rectal bleeding: Secondary | ICD-10-CM | POA: Diagnosis not present

## 2017-07-11 MED ORDER — TRAMADOL HCL 50 MG PO TABS: 50.0000 mg | ORAL_TABLET | Freq: Three times a day (TID) | ORAL | 0 refills | Status: DC | PRN

## 2017-07-11 NOTE — Progress Notes (Addendum)
   Subjective:    Patient ID: Lindsay Mccarthy, female    DOB: Feb 17, 1976, 41 y.o.   MRN: [id number]  HPI She is here for evaluation of left leg pain. Last Tuesday she had left elbow as well as left knee pain that lasted 24 hours and then went away. Saturday she developed left medial calf pain but no redness or increased temperature, chest pain, shortness of breath. No other joints are involved. No history of overuse or injury. She does have underlying Crohn's disease and is in the process of being switched to a different DMARD. She has tried NSAIDs in the past but avoids him due to GI bleeding from her underlying Crohn's. She has tried Celebrex in the past but was not very successful. No previous history of DVT.   Review of Systems     Objective:   Physical Exam Alert and in no distress. Breathing pattern is normal. Tender to palpation in the left medial calf but it is not hot. The lateral aspect of her calf is nontender. Pulses and skin appear normal.       Assessment & Plan:  Pain of left calf - Plan: VAS [place] LOWER EXTREMITY VENOUS (DVT), traMADol (ULTRAM) 50 MG tablet  Crohn's disease of small intestine with rectal bleeding (HCC)  Long-term use of immunosuppressant medication-Azathioprine and Cimzia  The Doppler showed superficial saphenous thrombosis. Case was discussed with Dr. early. We will treat with heat and pain medication.

## 2017-07-11 NOTE — Telephone Encounter (Signed)
Called in Tramadol to [facility] per [employer]. Victorino December

## 2017-07-11 NOTE — Progress Notes (Signed)
VASCULAR LAB PRELIMINARY  PRELIMINARY  PRELIMINARY  PRELIMINARY  Left lower extremity venous duplex completed.    Preliminary report:  Left:  No evidence of DVT or Baker's cyst. Positive for a superficlal thrombosis of the greater saphenous vein mid calf to proximal thigh.  Dulcemaria Bula, [place], RVS 07/11/2017, 5:23 PM

## 2017-07-13 NOTE — Telephone Encounter (Signed)
Patient was seen at [facility] on 07/11/17 and they are working on arranging Entyvio infusions

## 2017-08-09 ENCOUNTER — Encounter: Payer: Self-pay | Admitting: Internal Medicine

## 2017-08-21 ENCOUNTER — Other Ambulatory Visit: Payer: Self-pay | Admitting: Internal Medicine

## 2017-09-28 ENCOUNTER — Other Ambulatory Visit (INDEPENDENT_AMBULATORY_CARE_PROVIDER_SITE_OTHER): Payer: 59

## 2017-09-28 DIAGNOSIS — Z79899 Other long term (current) drug therapy: Secondary | ICD-10-CM

## 2017-09-28 LAB — CBC WITH DIFFERENTIAL/PLATELET
BASOS ABS: 0 10*3/uL (ref 0.0–0.1)
BASOS PCT: 0.6 % (ref 0.0–3.0)
EOS ABS: 0.2 10*3/uL (ref 0.0–0.7)
Eosinophils Relative: 2.4 % (ref 0.0–5.0)
HEMATOCRIT: 33.4 % — AB (ref 36.0–46.0)
Hemoglobin: 11 g/dL — ABNORMAL LOW (ref 12.0–15.0)
LYMPHS ABS: 1.3 10*3/uL (ref 0.7–4.0)
Lymphocytes Relative: 15.7 % (ref 12.0–46.0)
MCHC: 33 g/dL (ref 30.0–36.0)
MCV: 93.7 fl (ref 78.0–100.0)
MONO ABS: 0.9 10*3/uL (ref 0.1–1.0)
Monocytes Relative: 10.7 % (ref 3.0–12.0)
NEUTROS ABS: 5.9 10*3/uL (ref 1.4–7.7)
NEUTROS PCT: 70.6 % (ref 43.0–77.0)
PLATELETS: 463 10*3/uL — AB (ref 150.0–400.0)
RBC: 3.57 Mil/uL — ABNORMAL LOW (ref 3.87–5.11)
RDW: 15.7 % — AB (ref 11.5–15.5)
WBC: 8.4 10*3/uL (ref 4.0–10.5)

## 2017-09-29 ENCOUNTER — Other Ambulatory Visit: Payer: Self-pay

## 2017-09-29 DIAGNOSIS — K50119 Crohn's disease of large intestine with unspecified complications: Secondary | ICD-10-CM

## 2017-09-29 NOTE — Progress Notes (Signed)
CBC is stable - slight anemia  I/we overlooked doing LFT's - please ask her to get those and I want to check a ferritin (dx mild anemia) also    Thx

## 2017-09-30 ENCOUNTER — Other Ambulatory Visit (INDEPENDENT_AMBULATORY_CARE_PROVIDER_SITE_OTHER): Payer: 59

## 2017-09-30 DIAGNOSIS — K50119 Crohn's disease of large intestine with unspecified complications: Secondary | ICD-10-CM | POA: Diagnosis not present

## 2017-09-30 LAB — HEPATIC FUNCTION PANEL
ALBUMIN: 3.6 g/dL (ref 3.5–5.2)
ALK PHOS: 39 U/L (ref 39–117)
ALT: 9 U/L (ref 0–35)
AST: 13 U/L (ref 0–37)
Bilirubin, Direct: 0 mg/dL (ref 0.0–0.3)
TOTAL PROTEIN: 6.9 g/dL (ref 6.0–8.3)
Total Bilirubin: 0.3 mg/dL (ref 0.2–1.2)

## 2017-09-30 LAB — FERRITIN: FERRITIN: 6.8 ng/mL — AB (ref 10.0–291.0)

## 2017-10-03 NOTE — Progress Notes (Signed)
My Chart note

## 2017-10-05 ENCOUNTER — Encounter: Payer: Self-pay | Admitting: Internal Medicine

## 2017-10-05 ENCOUNTER — Ambulatory Visit (INDEPENDENT_AMBULATORY_CARE_PROVIDER_SITE_OTHER): Payer: 59 | Admitting: Internal Medicine

## 2017-10-05 VITALS — BP 110/72 | HR 88 | Ht 64.0 in | Wt 219.0 lb

## 2017-10-05 DIAGNOSIS — R5383 Other fatigue: Secondary | ICD-10-CM | POA: Insufficient documentation

## 2017-10-05 DIAGNOSIS — R12 Heartburn: Secondary | ICD-10-CM

## 2017-10-05 DIAGNOSIS — Z23 Encounter for immunization: Secondary | ICD-10-CM | POA: Diagnosis not present

## 2017-10-05 DIAGNOSIS — K50018 Crohn's disease of small intestine with other complication: Secondary | ICD-10-CM | POA: Diagnosis not present

## 2017-10-05 DIAGNOSIS — D500 Iron deficiency anemia secondary to blood loss (chronic): Secondary | ICD-10-CM | POA: Diagnosis not present

## 2017-10-05 DIAGNOSIS — Z7960 Long term (current) use of unspecified immunomodulators and immunosuppressants: Secondary | ICD-10-CM

## 2017-10-05 DIAGNOSIS — Z79899 Other long term (current) drug therapy: Secondary | ICD-10-CM | POA: Diagnosis not present

## 2017-10-05 MED ORDER — AZATHIOPRINE 50 MG PO TABS: 200.0000 mg | ORAL_TABLET | Freq: Every day | ORAL | 1 refills | Status: DC

## 2017-10-05 MED ORDER — FERROUS SULFATE 325 (65 FE) MG PO TABS: ORAL_TABLET | ORAL | 3 refills | Status: DC

## 2017-10-05 NOTE — Patient Instructions (Addendum)
  Please come and get lab work (CBC/diff,  LFT's) done 2 weeks after increasing your dose of azathioprine.  No appointment needed in the lab and they are open 7:30AM-5:30PM Mon-Friday   Today you have been given a flu vaccine and information sheet on this.   Please take Ferrous Sulfate 351m,  Take 1-2 daily.  This is over the counter.   You may take Tums or Pepcid Complete for your acid reflux as needed.   Walk daily please.   We are giving you GERD information to read and follow.   Follow up with uKoreain March 2019, call back in early February to make this appointment.   I appreciate the opportunity to care for you. CSilvano Rusk MD, [facility]

## 2017-10-05 NOTE — Progress Notes (Signed)
Lindsay Mccarthy 41 y.o. 12/25/1975 [id number]  Assessment & Plan:  Crohn's disease of small intestine (HCC) Entyvio loading done will continue every 8 weeks and also will increase the azathioprine she is not close to the 2.5 mg/kg max so we will increase to 200 mg.  Her 6TGN level was 235 which is lower point of therapeutic range recently.  Metabolites were not in toxic range. RTC March  Long-term use of immunosuppressant medication-Azathioprine and Entyvio Influenza vaccine today CBC LFT's 2 weeks after increasing the AZA  Iron deficiency anemia due to chronic blood loss Ferrous sulfate at least once a day.  Other fatigue Likely multifactorial.  I have encouraged her to take at least a daily walk perhaps 3 short walks a day, get some sunlight, as depression may be having a role.  We will see if ferrous sulfate supplementation and improvement in ferritin and hemoglobin helps also.  Heartburn She wondered if this was from [place], I doubt it.  She does 2.  Lifestyle modification has been undertaken.  Noticed after she left is that her weight has increased and that may have nothing to do with this as well.  Tums or Pepcid Complete generic as needed.  I appreciate the opportunity to care for this patient. CC: Rita Ohara, MD    Subjective:   Chief Complaint:  HPI The patient is here with her husband today, being seen after having her loading doses of Entyvio.  She still having about 8-10 stools a day but can generally make it to the bathroom.  She is about to change jobs and even though she works from home will have to be in a lot more conference calls etc. and is going to be submitting FMLA paperwork.  Her main complaints today beyond the diarrhea which she is dealing with overall are some right lower quadrant soreness, heartburn symptoms and fatigue.  She says in the last couple of months she has been noticing some heartburn when she eats, sometimes when eating in the morning  she will have some heartburn that starts in as long as she stops eating what she is eating right and that will go away.  She has moved suppertime up to about 530 because she was having some nocturnal symptoms.  All of that has helped.  She drinks 2 large cups of coffee a day.  No significant other reflux triggers that I note.  She is not a smoker.  Recent lab tests have shown a mildly declining hemoglobin and her ferritin was 6.  Wt Readings from Last 3 Encounters:  10/05/17 219 lb (99.3 kg)  07/11/17 210 lb 6.4 oz (95.4 kg)  06/22/17 209 lb (94.8 kg)      Allergies  Allergen Reactions  . Adhesive [Tape]     Rash with electrodes  . Ibuprofen     Avoids due to Crohns disease  . Morphine And Related     itchy  . Prednisone     "crazy", hallucinatory   Current Meds  Medication Sig  . acetaminophen (TYLENOL) 500 MG tablet Take 1,000 mg by mouth every 6 (six) hours as needed. Reported on 12/15/2015  . aspirin 325 MG tablet Take 325 mg by mouth daily.  Marland Kitchen azaTHIOprine (IMURAN) 50 MG tablet Take 4 tablets (200 mg total) by mouth daily.  Marland Kitchen buPROPion (WELLBUTRIN SR) 100 MG 12 hr tablet TAKE 1 TABLET (100 MG TOTAL) BY MOUTH 2 (TWO) TIMES DAILY.  . Calcium-Vitamin D-Vitamin K (CALCIUM SOFT CHEWS) 500-100-40 MG-UNT-MCG  CHEW Chew 1 tablet by mouth 2 (two) times daily.  . celecoxib (CELEBREX) 200 MG capsule Take 1 capsule (200 mg total) by mouth 2 (two) times daily as needed.  . cetirizine (ZYRTEC) 10 MG tablet Take 10 mg by mouth daily.  . cyanocobalamin (,VITAMIN B-12,) 1000 MCG/ML injection INJECT AS DIRECTED EVERY 30 DAYS  . diphenhydrAMINE (SOMINEX) 25 MG tablet Take 25 mg by mouth at bedtime as needed for sleep.  Marland Kitchen escitalopram (LEXAPRO) 10 MG tablet TAKE 1 TABLET (10 MG TOTAL) BY MOUTH DAILY.  . fenofibrate 160 MG tablet Take 1 tablet (160 mg total) by mouth daily.  Marland Kitchen levonorgestrel (MIRENA) 20 MCG/24HR IUD 1 each by Intrauterine route once.  . Omega-3 Fatty Acids (FISH OIL) 1000 MG CAPS  Take 2 capsules by mouth daily.  . Vedolizumab (ENTYVIO IV) Inject into the vein.  . [DISCONTINUED] azaTHIOprine (IMURAN) 50 MG tablet TAKE 3 TABLETS BY MOUTH EVERY DAY   Past Medical History:  Diagnosis Date  . Anemia    related to Crohns flares  . Anxiety   . Arthritis    knees, feet, hands, wrists, related to Crohns flare  . Asthma    Childhood  . B12 deficiency    monitored/treated by Dr. Carlean Purl  . Cervical dysplasia   . Crohn's disease of small intestine ([place]) 06/24/2012   Diagnosed 1999, in [place]. Ileitis only then. Treated with Imuran Remicade and prednisone.  Noncompliant with therapy. 2004 return to care and was treated with Remicade prednisone Pentasa Cipro and Flagyl. 2006 status post right hemicolectomy. Subsequently treated with azathioprine and Cimzia. 200 mg every other week.  azathioprine was added in 2010. Prometheus TP MT enzyme was negative. Has ha  . Depression   . Eczema    arms and behind knees, worse in winter  . History of recurrent UTI (urinary tract infection)   . HPV (human papilloma virus) infection   . Hyperlipemia   . Osteopenia of femur neck T. score -1.4 09/29/2011   09/2013 T-1.6  . Papanicolaou smear 12/12   last abnormal 2011  . Scaphoid fracture of wrist 2013   left  . Seasonal allergic rhinitis   . Shingles 09/2015   R hip  . Wears glasses    Past Surgical History:  Procedure Laterality Date  . APPENDECTOMY    . CERVICAL BIOPSY  W/ LOOP ELECTRODE EXCISION  2009   ---paps normal since  . COLONOSCOPY  multiple   scanned  . ESOPHAGOGASTRODUODENOSCOPY  multiple   scanned  . HEMICOLECTOMY  2006  . LEEP  2011   --done in [place]  . WISDOM TOOTH EXTRACTION     Social History   Social History Narrative   The patient is divorced.     Re-married Marcello Moores, partner of 10 years, on 10/10/16. 1 cats, 1 dog   No children - doesn't want any   [place].   Moved from [place] to [place] in 2013.   Past  smoker   No alcohol   2 caffeinated beverages a day   She reports she is compliant with sunscreen given her increased risk of sun damage and skin cancer on azathioprine      Updated 12/15/16      Review of Systems As per HPI Objective:   Physical Exam @BP  110/72   Pulse 88   Ht 5' 4"  (1.626 m)   Wt 219 lb (99.3 kg)   BMI 37.59 kg/m @  General:  NAD Eyes:  anicteric Lungs:  clear Heart::  S1S2 no rubs, murmurs or gallops Abdomen:  Obese soft and mildly tender with some fullness in the right lower quadrant, BS+ Ext:   no edema, cyanosis or clubbing    Data Reviewed:  See HPI  Tender RLQ

## 2017-10-05 NOTE — Assessment & Plan Note (Addendum)
Influenza vaccine today CBC LFT's 2 weeks after increasing the AZA

## 2017-10-05 NOTE — Assessment & Plan Note (Addendum)
Entyvio loading done will continue every 8 weeks and also will increase the azathioprine she is not close to the 2.5 mg/kg max so we will increase to 200 mg.  Her 6TGN level was 235 which is lower point of therapeutic range recently.  Metabolites were not in toxic range. RTC March

## 2017-10-05 NOTE — Assessment & Plan Note (Signed)
Likely multifactorial.  I have encouraged her to take at least a daily walk perhaps 3 short walks a day, get some sunlight, as depression may be having a role.  We will see if ferrous sulfate supplementation and improvement in ferritin and hemoglobin helps also.

## 2017-10-05 NOTE — Assessment & Plan Note (Addendum)
She wondered if this was from [place], I doubt it.  She does 2.  Lifestyle modification has been undertaken.  Noticed after she left is that her weight has increased and that may have nothing to do with this as well.  Tums or Pepcid Complete generic as needed.

## 2017-10-05 NOTE — Assessment & Plan Note (Signed)
Ferrous sulfate at least once a day.

## 2017-10-14 ENCOUNTER — Encounter: Payer: Self-pay | Admitting: Internal Medicine

## 2017-10-15 ENCOUNTER — Other Ambulatory Visit: Payer: Self-pay | Admitting: Family Medicine

## 2017-10-15 ENCOUNTER — Other Ambulatory Visit: Payer: Self-pay | Admitting: Internal Medicine

## 2017-10-15 DIAGNOSIS — F325 Major depressive disorder, single episode, in full remission: Secondary | ICD-10-CM

## 2017-10-17 ENCOUNTER — Ambulatory Visit (INDEPENDENT_AMBULATORY_CARE_PROVIDER_SITE_OTHER): Payer: 59 | Admitting: Family Medicine

## 2017-10-17 ENCOUNTER — Encounter: Payer: Self-pay | Admitting: Family Medicine

## 2017-10-17 VITALS — BP 120/84 | HR 86 | Temp 98.0°F | Ht 64.0 in | Wt 219.4 lb

## 2017-10-17 DIAGNOSIS — J069 Acute upper respiratory infection, unspecified: Secondary | ICD-10-CM

## 2017-10-17 DIAGNOSIS — K50018 Crohn's disease of small intestine with other complication: Secondary | ICD-10-CM

## 2017-10-17 DIAGNOSIS — B9789 Other viral agents as the cause of diseases classified elsewhere: Secondary | ICD-10-CM

## 2017-10-17 NOTE — Progress Notes (Signed)
   Subjective:    Patient ID: Lindsay Mccarthy, female    DOB: [birth year], 41 y.o.   MRN: [id number]  HPI She complains of a one day history of slight cough and questionable fever but no sore throat, earache, chest congestion, malaise or myalgias.  She does have an underlying history of Crohn's disease and is on a biologic agent.   Review of Systems     Objective:   Physical Exam Alert and in no distress. Tympanic membranes and canals are normal. Pharyngeal area is normal. Neck is supple without adenopathy or thyromegaly. Cardiac exam shows a regular sinus rhythm without murmurs or gallops. Lungs are clear to auscultation.        Assessment & Plan:  Crohn's disease of small intestine with other complication ([place])  Viral URI with cough Recommend supportive care.  She will call if she gets worse especially in regard to her being on a biologic.

## 2017-10-21 ENCOUNTER — Other Ambulatory Visit: Payer: Self-pay | Admitting: Internal Medicine

## 2017-10-21 ENCOUNTER — Other Ambulatory Visit (INDEPENDENT_AMBULATORY_CARE_PROVIDER_SITE_OTHER): Payer: 59

## 2017-10-21 DIAGNOSIS — K50018 Crohn's disease of small intestine with other complication: Secondary | ICD-10-CM

## 2017-10-21 DIAGNOSIS — D500 Iron deficiency anemia secondary to blood loss (chronic): Secondary | ICD-10-CM | POA: Diagnosis not present

## 2017-10-21 DIAGNOSIS — Z7960 Long term (current) use of unspecified immunomodulators and immunosuppressants: Secondary | ICD-10-CM

## 2017-10-21 DIAGNOSIS — Z79899 Other long term (current) drug therapy: Secondary | ICD-10-CM

## 2017-10-21 LAB — CBC WITH DIFFERENTIAL/PLATELET
BASOS ABS: 0 10*3/uL (ref 0.0–0.1)
Basophils Relative: 0.6 % (ref 0.0–3.0)
Eosinophils Absolute: 0.2 10*3/uL (ref 0.0–0.7)
Eosinophils Relative: 2.5 % (ref 0.0–5.0)
HEMATOCRIT: 35 % — AB (ref 36.0–46.0)
HEMOGLOBIN: 11.4 g/dL — AB (ref 12.0–15.0)
LYMPHS PCT: 23.5 % (ref 12.0–46.0)
Lymphs Abs: 1.6 10*3/uL (ref 0.7–4.0)
MCHC: 32.5 g/dL (ref 30.0–36.0)
MCV: 92.4 fl (ref 78.0–100.0)
MONOS PCT: 11.5 % (ref 3.0–12.0)
Monocytes Absolute: 0.8 10*3/uL (ref 0.1–1.0)
Neutro Abs: 4.2 10*3/uL (ref 1.4–7.7)
Neutrophils Relative %: 61.9 % (ref 43.0–77.0)
Platelets: 500 10*3/uL — ABNORMAL HIGH (ref 150.0–400.0)
RBC: 3.79 Mil/uL — AB (ref 3.87–5.11)
RDW: 15.1 % (ref 11.5–15.5)
WBC: 6.8 10*3/uL (ref 4.0–10.5)

## 2017-10-21 LAB — HEPATIC FUNCTION PANEL
ALBUMIN: 3.7 g/dL (ref 3.5–5.2)
ALT: 8 U/L (ref 0–35)
AST: 12 U/L (ref 0–37)
Alkaline Phosphatase: 44 U/L (ref 39–117)
BILIRUBIN DIRECT: 0.1 mg/dL (ref 0.0–0.3)
TOTAL PROTEIN: 7.4 g/dL (ref 6.0–8.3)
Total Bilirubin: 0.2 mg/dL (ref 0.2–1.2)

## 2017-10-21 NOTE — Progress Notes (Signed)
LFT's NL Mild anemia persists Recheck labs 1 month My Chart message

## 2017-10-31 ENCOUNTER — Encounter: Payer: Self-pay | Admitting: Internal Medicine

## 2017-11-01 DIAGNOSIS — G35 Multiple sclerosis: Secondary | ICD-10-CM

## 2017-11-01 HISTORY — DX: Multiple sclerosis: G35

## 2017-11-15 ENCOUNTER — Other Ambulatory Visit: Payer: Self-pay | Admitting: Family Medicine

## 2017-11-15 DIAGNOSIS — F325 Major depressive disorder, single episode, in full remission: Secondary | ICD-10-CM

## 2017-11-15 NOTE — Telephone Encounter (Signed)
Refilled x 90d; appt next month

## 2017-11-15 NOTE — Telephone Encounter (Signed)
Can pt have a refill on meds  

## 2017-11-27 ENCOUNTER — Emergency Department (HOSPITAL_COMMUNITY): Payer: 59

## 2017-11-27 ENCOUNTER — Encounter (HOSPITAL_COMMUNITY): Payer: Self-pay

## 2017-11-27 ENCOUNTER — Other Ambulatory Visit: Payer: Self-pay

## 2017-11-27 ENCOUNTER — Emergency Department (HOSPITAL_COMMUNITY)
Admit: 2017-11-27 | Discharge: 2017-11-27 | Disposition: A | Discharge status: Home / Self Care | Payer: 59 | Attending: Emergency Medicine | Admitting: Emergency Medicine

## 2017-11-27 ENCOUNTER — Inpatient Hospital Stay (HOSPITAL_COMMUNITY)
Admission: EM | Admit: 2017-11-27 | Discharge: 2017-12-06 | DRG: 270 | Disposition: A | Discharge status: Home / Self Care | Payer: 59 | Attending: Surgery | Admitting: Surgery

## 2017-11-27 DIAGNOSIS — D649 Anemia, unspecified: Secondary | ICD-10-CM | POA: Diagnosis present

## 2017-11-27 DIAGNOSIS — I2699 Other pulmonary embolism without acute cor pulmonale: Secondary | ICD-10-CM | POA: Diagnosis not present

## 2017-11-27 DIAGNOSIS — I82411 Acute embolism and thrombosis of right femoral vein: Principal | ICD-10-CM | POA: Diagnosis present

## 2017-11-27 DIAGNOSIS — I82419 Acute embolism and thrombosis of unspecified femoral vein: Secondary | ICD-10-CM | POA: Diagnosis present

## 2017-11-27 DIAGNOSIS — K5000 Crohn's disease of small intestine without complications: Secondary | ICD-10-CM | POA: Diagnosis present

## 2017-11-27 DIAGNOSIS — Z87891 Personal history of nicotine dependence: Secondary | ICD-10-CM

## 2017-11-27 DIAGNOSIS — Z86718 Personal history of other venous thrombosis and embolism: Secondary | ICD-10-CM | POA: Diagnosis not present

## 2017-11-27 DIAGNOSIS — M545 Low back pain: Secondary | ICD-10-CM | POA: Diagnosis not present

## 2017-11-27 DIAGNOSIS — F329 Major depressive disorder, single episode, unspecified: Secondary | ICD-10-CM | POA: Diagnosis present

## 2017-11-27 DIAGNOSIS — I2601 Septic pulmonary embolism with acute cor pulmonale: Secondary | ICD-10-CM

## 2017-11-27 DIAGNOSIS — E785 Hyperlipidemia, unspecified: Secondary | ICD-10-CM | POA: Diagnosis present

## 2017-11-27 DIAGNOSIS — J9601 Acute respiratory failure with hypoxia: Secondary | ICD-10-CM | POA: Diagnosis not present

## 2017-11-27 DIAGNOSIS — Z79899 Other long term (current) drug therapy: Secondary | ICD-10-CM

## 2017-11-27 DIAGNOSIS — Z7982 Long term (current) use of aspirin: Secondary | ICD-10-CM

## 2017-11-27 DIAGNOSIS — M7989 Other specified soft tissue disorders: Secondary | ICD-10-CM | POA: Diagnosis present

## 2017-11-27 DIAGNOSIS — E538 Deficiency of other specified B group vitamins: Secondary | ICD-10-CM | POA: Diagnosis present

## 2017-11-27 DIAGNOSIS — I824Y1 Acute embolism and thrombosis of unspecified deep veins of right proximal lower extremity: Secondary | ICD-10-CM

## 2017-11-27 DIAGNOSIS — I82621 Acute embolism and thrombosis of deep veins of right upper extremity: Secondary | ICD-10-CM | POA: Diagnosis not present

## 2017-11-27 DIAGNOSIS — I82491 Acute embolism and thrombosis of other specified deep vein of right lower extremity: Secondary | ICD-10-CM

## 2017-11-27 DIAGNOSIS — J9600 Acute respiratory failure, unspecified whether with hypoxia or hypercapnia: Secondary | ICD-10-CM

## 2017-11-27 DIAGNOSIS — M79609 Pain in unspecified limb: Secondary | ICD-10-CM | POA: Diagnosis not present

## 2017-11-27 DIAGNOSIS — I2609 Other pulmonary embolism with acute cor pulmonale: Secondary | ICD-10-CM | POA: Diagnosis not present

## 2017-11-27 DIAGNOSIS — I2782 Chronic pulmonary embolism: Secondary | ICD-10-CM | POA: Diagnosis not present

## 2017-11-27 LAB — BASIC METABOLIC PANEL
Anion gap: 10 (ref 5–15)
BUN: 10 mg/dL (ref 6–20)
CALCIUM: 8.4 mg/dL — AB (ref 8.9–10.3)
CO2: 20 mmol/L — ABNORMAL LOW (ref 22–32)
CREATININE: 0.84 mg/dL (ref 0.44–1.00)
Chloride: 106 mmol/L (ref 101–111)
GFR calc Af Amer: >60 mL/min (ref >60)
Glucose, Bld: 93 mg/dL (ref 65–99)
Potassium: 3.7 mmol/L (ref 3.5–5.1)
SODIUM: 136 mmol/L (ref 135–145)

## 2017-11-27 LAB — URINALYSIS, ROUTINE W REFLEX MICROSCOPIC
Bilirubin Urine: NEGATIVE
GLUCOSE, UA: NEGATIVE mg/dL
Hgb urine dipstick: NEGATIVE
KETONES UR: NEGATIVE mg/dL
LEUKOCYTES UA: NEGATIVE
Nitrite: NEGATIVE
PROTEIN: NEGATIVE mg/dL
Specific Gravity, Urine: >1.046 — ABNORMAL HIGH (ref 1.005–1.030)
pH: 5 (ref 5.0–8.0)

## 2017-11-27 LAB — I-STAT BETA HCG BLOOD, ED (MC, WL, AP ONLY)

## 2017-11-27 LAB — CBC
HCT: 34.9 % — ABNORMAL LOW (ref 36.0–46.0)
Hemoglobin: 11.4 g/dL — ABNORMAL LOW (ref 12.0–15.0)
MCH: 30.4 pg (ref 26.0–34.0)
MCHC: 32.7 g/dL (ref 30.0–36.0)
MCV: 93.1 fL (ref 78.0–100.0)
PLATELETS: 281 10*3/uL (ref 150–400)
RBC: 3.75 MIL/uL — AB (ref 3.87–5.11)
RDW: 15.4 % (ref 11.5–15.5)
WBC: 10.4 10*3/uL (ref 4.0–10.5)

## 2017-11-27 LAB — PROTIME-INR
INR: 1.21
PROTHROMBIN TIME: 15.2 s (ref 11.4–15.2)

## 2017-11-27 MED ORDER — FERROUS SULFATE 325 (65 FE) MG PO TABS
325.0000 mg | ORAL_TABLET | Freq: Every day | ORAL | Status: DC
  Administered 2017-11-27 – 2017-12-05 (×9): 325 mg via ORAL
  Filled 2017-11-27 (×9): qty 1

## 2017-11-27 MED ORDER — ASPIRIN 325 MG PO TABS
325.0000 mg | ORAL_TABLET | Freq: Every day | ORAL | Status: DC
  Administered 2017-11-28 – 2017-12-06 (×7): 325 mg via ORAL
  Filled 2017-11-27 (×7): qty 1

## 2017-11-27 MED ORDER — OMEGA-3-ACID ETHYL ESTERS 1 G PO CAPS
1.0000 g | ORAL_CAPSULE | Freq: Two times a day (BID) | ORAL | Status: DC
  Administered 2017-11-27 – 2017-12-03 (×3): 1 g via ORAL
  Filled 2017-11-27 (×12): qty 1

## 2017-11-27 MED ORDER — FENOFIBRATE 160 MG PO TABS
160.0000 mg | ORAL_TABLET | Freq: Every day | ORAL | Status: DC
  Administered 2017-11-27 – 2017-12-05 (×9): 160 mg via ORAL
  Filled 2017-11-27 (×10): qty 1

## 2017-11-27 MED ORDER — POTASSIUM CHLORIDE CRYS ER 20 MEQ PO TBCR
20.0000 meq | EXTENDED_RELEASE_TABLET | Freq: Once | ORAL | Status: AC
  Administered 2017-11-27: 20 meq via ORAL
  Filled 2017-11-27: qty 1

## 2017-11-27 MED ORDER — DIPHENHYDRAMINE HCL 25 MG PO CAPS
25.0000 mg | ORAL_CAPSULE | Freq: Four times a day (QID) | ORAL | Status: DC | PRN
  Administered 2017-11-28 – 2017-12-06 (×14): 25 mg via ORAL
  Filled 2017-11-27 (×14): qty 1

## 2017-11-27 MED ORDER — HEPARIN (PORCINE) IN NACL 100-0.45 UNIT/ML-% IJ SOLN
1800.0000 [IU]/h | INTRAMUSCULAR | Status: DC
  Administered 2017-11-27: 1400 [IU]/h via INTRAVENOUS
  Administered 2017-11-28 (×2): 1700 [IU]/h via INTRAVENOUS
  Filled 2017-11-27 (×5): qty 250

## 2017-11-27 MED ORDER — HYDRALAZINE HCL 20 MG/ML IJ SOLN: 5.0000 mg | INTRAMUSCULAR | Status: DC | PRN

## 2017-11-27 MED ORDER — DEXTROSE-NACL 5-0.45 % IV SOLN
INTRAVENOUS | Status: DC
  Administered 2017-11-27: 20:00:00 via INTRAVENOUS

## 2017-11-27 MED ORDER — IOPAMIDOL (ISOVUE-370) INJECTION 76%
INTRAVENOUS | Status: AC
  Administered 2017-11-27: 100 mL
  Filled 2017-11-27: qty 100

## 2017-11-27 MED ORDER — ACETAMINOPHEN 325 MG PO TABS
325.0000 mg | ORAL_TABLET | ORAL | Status: DC | PRN
  Administered 2017-11-29 – 2017-11-30 (×2): 650 mg via ORAL
  Filled 2017-11-27 (×2): qty 2

## 2017-11-27 MED ORDER — PANTOPRAZOLE SODIUM 40 MG PO TBEC
40.0000 mg | DELAYED_RELEASE_TABLET | Freq: Every day | ORAL | Status: DC
  Administered 2017-11-27 – 2017-12-06 (×8): 40 mg via ORAL
  Filled 2017-11-27 (×8): qty 1

## 2017-11-27 MED ORDER — DOCUSATE SODIUM 100 MG PO CAPS
100.0000 mg | ORAL_CAPSULE | Freq: Two times a day (BID) | ORAL | Status: DC
  Administered 2017-12-02: 100 mg via ORAL
  Filled 2017-11-27 (×10): qty 1

## 2017-11-27 MED ORDER — CALCIUM-VITAMIN D-VITAMIN K 500-100-40 MG-UNT-MCG PO CHEW: 1.0000 | CHEWABLE_TABLET | Freq: Two times a day (BID) | ORAL | Status: DC

## 2017-11-27 MED ORDER — GUAIFENESIN-DM 100-10 MG/5ML PO SYRP
15.0000 mL | ORAL_SOLUTION | ORAL | Status: DC | PRN
  Administered 2017-12-03: 15 mL via ORAL
  Filled 2017-11-27 (×2): qty 15

## 2017-11-27 MED ORDER — PHENOL 1.4 % MT LIQD: 1.0000 | OROMUCOSAL | Status: DC | PRN

## 2017-11-27 MED ORDER — ESCITALOPRAM OXALATE 10 MG PO TABS
10.0000 mg | ORAL_TABLET | Freq: Every day | ORAL | Status: DC
  Administered 2017-11-28 – 2017-12-06 (×7): 10 mg via ORAL
  Filled 2017-11-27 (×8): qty 1

## 2017-11-27 MED ORDER — ACETAMINOPHEN 325 MG RE SUPP
325.0000 mg | RECTAL | Status: DC | PRN
  Filled 2017-11-27: qty 2

## 2017-11-27 MED ORDER — BUPROPION HCL ER (SR) 100 MG PO TB12
100.0000 mg | ORAL_TABLET | Freq: Two times a day (BID) | ORAL | Status: DC
  Administered 2017-11-27 – 2017-12-06 (×16): 100 mg via ORAL
  Filled 2017-11-27 (×20): qty 1

## 2017-11-27 MED ORDER — ONDANSETRON HCL 4 MG/2ML IJ SOLN
4.0000 mg | Freq: Four times a day (QID) | INTRAMUSCULAR | Status: DC | PRN
Start: 2017-11-27 — End: 2017-12-06

## 2017-11-27 MED ORDER — MORPHINE SULFATE (PF) 4 MG/ML IV SOLN
2.0000 mg | INTRAVENOUS | Status: DC | PRN
  Administered 2017-11-28: 2 mg via INTRAVENOUS
  Administered 2017-11-28: 4 mg via INTRAVENOUS
  Administered 2017-11-28 – 2017-11-29 (×4): 2 mg via INTRAVENOUS
  Administered 2017-11-29: 4 mg via INTRAVENOUS
  Filled 2017-11-27 (×7): qty 1

## 2017-11-27 MED ORDER — HEPARIN BOLUS VIA INFUSION
4500.0000 [IU] | Freq: Once | INTRAVENOUS | Status: AC
  Administered 2017-11-27: 4500 [IU] via INTRAVENOUS
  Filled 2017-11-27: qty 4500

## 2017-11-27 MED ORDER — ALUM & MAG HYDROXIDE-SIMETH 200-200-20 MG/5ML PO SUSP
15.0000 mL | ORAL | Status: DC | PRN
  Filled 2017-11-27: qty 30

## 2017-11-27 MED ORDER — METOPROLOL TARTRATE 5 MG/5ML IV SOLN: 2.0000 mg | INTRAVENOUS | Status: DC | PRN

## 2017-11-27 MED ORDER — AZATHIOPRINE 50 MG PO TABS
200.0000 mg | ORAL_TABLET | Freq: Every day | ORAL | Status: DC
  Administered 2017-11-28 – 2017-12-06 (×7): 200 mg via ORAL
  Filled 2017-11-27 (×9): qty 4

## 2017-11-27 MED ORDER — OXYCODONE HCL 5 MG PO TABS
5.0000 mg | ORAL_TABLET | ORAL | Status: DC | PRN
  Administered 2017-11-27 – 2017-12-01 (×17): 10 mg via ORAL
  Administered 2017-12-02 (×2): 5 mg via ORAL
  Administered 2017-12-02 – 2017-12-03 (×5): 10 mg via ORAL
  Filled 2017-11-27 (×6): qty 2
  Filled 2017-11-27: qty 1
  Filled 2017-11-27 (×13): qty 2
  Filled 2017-11-27: qty 1
  Filled 2017-11-27 (×4): qty 2

## 2017-11-27 MED ORDER — LABETALOL HCL 5 MG/ML IV SOLN: 10.0000 mg | INTRAVENOUS | Status: DC | PRN

## 2017-11-27 NOTE — ED Notes (Signed)
Report attempted 

## 2017-11-27 NOTE — ED Provider Notes (Deleted)
Patient placed in Quick Look pathway, seen and evaluated   Chief Complaint: Right leg pain and swelling  HPI:   Patient reports that she developed bilateral leg swelling yesterday.  Noted that the left leg improved after exercise at the gym.  However the right leg swelling worsen and became painful.  The pain is felt in the right inguinal region down to the calve.  It is exacerbated with ambulation, movement, palpation.  She endorses a history of Crohn's disease.  Denies any history of DVT/PE.  She does endorse prior history of superficial thrombophlebitis.  No recent travel, surgeries, history of cancer.  Patient is on Mirena IUD.  She denies trauma.  ROS: Fevers, chills, chest pain, shortness of breath.  (one)  Physical Exam:   Gen: No distress  Neuro: Awake and Alert  Skin: Warm    Focused Exam: Right leg swelling.  Larger in size when compared to the contralateral leg.  2+ DP pulses.  Discomfort with palpation of the calf, popliteal region, inguinal region.  No lymphadenopathy noted.  Suspicious for possible DVT.  Will obtain Doppler and screening labs.  Denied any trauma, thus plain films not necessary at this time.   Initiation of care has begun. The patient has been counseled on the process, plan, and necessity for staying for the completion/evaluation, and the remainder of the medical screening examination    Kymberli Wiegand, Grayce Sessions, MD 11/27/17 1404

## 2017-11-27 NOTE — Progress Notes (Signed)
Pt received from ED. Pt oriented to room and equipment. Call bell within reach. Telemetry applied, CCMD notified x2. VSS. Pt describes allergic reaction to baby wipes - CHG wipe deferred.  Fritz Pickerel, RN

## 2017-11-27 NOTE — Progress Notes (Signed)
ANTICOAGULATION CONSULT NOTE - Initial Consult  Pharmacy Consult for heparin Indication: pulmonary embolus and DVT  Allergies  Allergen Reactions  . Adhesive [Tape]     Rash with electrodes  . Ibuprofen     Avoids due to Crohns disease  . Morphine And Related     itchy  . Prednisone     "crazy", hallucinatory    Patient Measurements: Height: 5' 4"  (162.6 cm) Weight: 220 lb (99.8 kg) IBW/kg (Calculated) : 54.7 Heparin Dosing Weight: 77.8kg  Vital Signs: Temp: 98.9 F (37.2 C) (01/27 1337) Temp Source: Oral (01/27 1337) BP: 114/77 (01/27 1337) Pulse Rate: 101 (01/27 1337)  Labs: Recent Labs    11/27/17 1347  HGB 11.4*  HCT 34.9*  PLT 281  CREATININE 0.84    Estimated Creatinine Clearance: 103.2 mL/min (by C-G formula based on SCr of 0.84 mg/dL).   Medical History: Past Medical History:  Diagnosis Date  . Anemia    related to Crohns flares  . Anxiety   . Arthritis    knees, feet, hands, wrists, related to Crohns flare  . Asthma    Childhood  . B12 deficiency    monitored/treated by Dr. Carlean Purl  . Cervical dysplasia   . Crohn's disease of small intestine ([place]) 06/24/2012   Diagnosed 1999, in [place]. Ileitis only then. Treated with Imuran Remicade and prednisone.  Noncompliant with therapy. 2004 return to care and was treated with Remicade prednisone Pentasa Cipro and Flagyl. 2006 status post right hemicolectomy. Subsequently treated with azathioprine and Cimzia. 200 mg every other week.  azathioprine was added in 2010. Prometheus TP MT enzyme was negative. Has ha  . Depression   . Eczema    arms and behind knees, worse in winter  . History of recurrent UTI (urinary tract infection)   . HPV (human papilloma virus) infection   . Hyperlipemia   . Osteopenia of femur neck T. score -1.4 09/29/2011   09/2013 T-1.6  . Papanicolaou smear 12/12   last abnormal 2011  . Scaphoid fracture of wrist 2013   left  . Seasonal allergic rhinitis   . Shingles 09/2015    R hip  . Wears glasses    Assessment: 44 yof presented to the ED with RLE pain and swelling. Found to have a DVT and possible PE. To start IV heparin. Baseline H/H is slightly low but platelets are WNL. She is not on anticoagulation PTA.   Goal of Therapy:  Heparin level 0.3-0.7 units/ml Monitor platelets by anticoagulation protocol: Yes   Plan:  Heparin bolus 4500 units IV x 1 Heparin gtt 1400 units/hr Check a 6 hr heparin level Daily heparin level and CBC F/u plans for long-term AC  Aiyla Baucom, Rande Lawman 11/27/2017,5:21 PM

## 2017-11-27 NOTE — Progress Notes (Signed)
Right lower extremity venous duplex completed. Positive for an acute DVT noted coursing from the distal femoral vein, profunda , and common femoral vein. Unable to evaluated the iliac due to edema and body habitus. Also noted is a thrombus in the saphenofemoral junction. [facility], [place] 11/27/2017, 3:32 PM

## 2017-11-27 NOTE — ED Provider Notes (Signed)
[facility] 4E CV SURGICAL PROGRESSIVE CARE Provider Note  CSN: [id number] Arrival date & time: 11/27/17 1258  Chief Complaint(s) leg pain/leg swelling  HPI Lindsay Mccarthy is a 42 y.o. female w/ h/o Crohn's here with right leg pain  HPI  Patient reports that she developed bilateral leg swelling yesterday.  Noted that the left leg improved after exercise at the gym.  However the right leg swelling worsen and became painful.  The pain is felt in the right inguinal region down to the calve.  It is exacerbated with ambulation, movement, palpation.  She endorses a history of Crohn's disease.  Denies any history of DVT/PE.  She does endorse prior history of superficial thrombophlebitis.  No recent travel, surgeries, history of cancer.  Patient is on Mirena IUD.  She denies trauma.  She denies any recent fevers chills or infections.  Patient is endorsing associated pleuritic chest discomfort and dyspnea on exertion which is worse than her usual dyspnea.  She noted this while walking up the stairs earlier today.  Denies any other physical complaints.   Past Medical History Past Medical History:  Diagnosis Date  . Anemia    related to Crohns flares  . Anxiety   . Arthritis    knees, feet, hands, wrists, related to Crohns flare  . Asthma    Childhood  . B12 deficiency    monitored/treated by Dr. Carlean Purl  . Cervical dysplasia   . Crohn's disease of small intestine ([place]) 06/24/2012   Diagnosed 1999, in [place]. Ileitis only then. Treated with Imuran Remicade and prednisone.  Noncompliant with therapy. 2004 return to care and was treated with Remicade prednisone Pentasa Cipro and Flagyl. 2006 status post right hemicolectomy. Subsequently treated with azathioprine and Cimzia. 200 mg every other week.  azathioprine was added in 2010. Prometheus TP MT enzyme was negative. Has ha  . Depression   . Eczema    arms and behind knees, worse in winter  . History of recurrent UTI (urinary tract infection)     . HPV (human papilloma virus) infection   . Hyperlipemia   . Osteopenia of femur neck T. score -1.4 09/29/2011   09/2013 T-1.6  . Papanicolaou smear 12/12   last abnormal 2011  . Scaphoid fracture of wrist 2013   left  . Seasonal allergic rhinitis   . Shingles 09/2015   R hip  . Wears glasses    Patient Active Problem List   Diagnosis Date Noted  . Pulmonary embolus ([place]) 11/27/2017  . Iron deficiency anemia due to chronic blood loss 10/05/2017  . Heartburn 10/05/2017  . Other fatigue 10/05/2017  . Vitamin D deficiency 12/16/2015  . Depression, major, in remission ([place]) 10/03/2013  . Hypertriglyceridemia 07/03/2012  . Crohn's disease of small intestine ([place]) 06/24/2012  . Long-term use of immunosuppressant medication-Azathioprine and Entyvio 06/24/2012  . Vitamin B12 deficiency 06/19/2012  . Osteopenia of femur neck T. score -1.4 09/29/2011   Home Medication(s) Prior to Admission medications   Medication Sig Start Date End Date Taking? Authorizing Provider  acetaminophen (TYLENOL) 500 MG tablet Take 1,000 mg by mouth every 6 (six) hours as needed for headache (pain). Reported on 12/15/2015   Yes [provider]  aspirin 325 MG tablet Take 325 mg by mouth daily.   Yes [provider]  azaTHIOprine (IMURAN) 50 MG tablet Take 4 tablets by mouth every day Patient taking differently: Take 200 mg by mouth daily.  10/17/17  Yes Gatha Mayer, MD  buPROPion [facility] SR)  100 MG 12 hr tablet TAKE 1 TABLET BY MOUTH TWICE A DAY Patient taking differently: TAKE 1 TABLET (100 MG) BY MOUTH TWICE A DAY 11/15/17  Yes Rita Ohara, MD  cetirizine (ZYRTEC) 10 MG tablet Take 10 mg by mouth daily.   Yes [provider]  cyanocobalamin (,VITAMIN B-12,) 1000 MCG/ML injection INJECT AS DIRECTED EVERY 30 DAYS Patient taking differently: INJECT 1 ML (1000 MCG) INTRAMUSCULARLY EVERY 30 DAYS 03/03/17  Yes Gatha Mayer, MD  diphenhydrAMINE (SOMINEX) 25 MG tablet Take 25 mg by  mouth at bedtime.    Yes [provider]  escitalopram (LEXAPRO) 10 MG tablet TAKE 1 TABLET (10 MG TOTAL) BY MOUTH DAILY. 10/17/17  Yes Rita Ohara, MD  fenofibrate 160 MG tablet Take 1 tablet (160 mg total) by mouth daily. Patient taking differently: Take 160 mg by mouth at bedtime.  01/17/17  Yes Rita Ohara, MD  ferrous sulfate 325 (65 FE) MG tablet Take 1-2 tablets daily with food - at least 1x/day Patient taking differently: Take 325 mg by mouth 2 (two) times daily with a meal.  10/05/17  Yes Gatha Mayer, MD  levonorgestrel (MIRENA) 20 MCG/24HR IUD 1 each by Intrauterine route once. Implanted April 2017 05/19/11  Yes [provider]  Menthol, Topical Analgesic, (BIOFREEZE EX) Apply 1 application topically 3 (three) times daily as needed (pain).   Yes [provider]  Omega-3 Fatty Acids (FISH OIL) 1200 MG CAPS Take 1,200 mg by mouth 2 (two) times daily.   Yes [provider]  traMADol (ULTRAM) 50 MG tablet Take 50 mg by mouth daily as needed (pain).   Yes [provider]  Vedolizumab (ENTYVIO IV) Inject into the vein See admin instructions. Administer once every 8 weeks, ordered by Dr. Carlean Purl (last injection 10/31/17)   Yes [provider]  azaTHIOprine (IMURAN) 50 MG tablet Take 4 tablets (200 mg total) by mouth daily. Patient not taking: Reported on 11/27/2017 10/05/17   Gatha Mayer, MD  celecoxib (CELEBREX) 200 MG capsule Take 1 capsule (200 mg total) by mouth 2 (two) times daily as needed. Patient not taking: Reported on 11/27/2017 05/11/17   Gatha Mayer, MD                                                                                                                                    Past Surgical History Past Surgical History:  Procedure Laterality Date  . APPENDECTOMY    . CERVICAL BIOPSY  W/ LOOP ELECTRODE EXCISION  2009   ---paps normal since  . COLONOSCOPY  multiple   scanned  . ESOPHAGOGASTRODUODENOSCOPY  multiple    scanned  . HEMICOLECTOMY  2006  . LEEP  2011   --done in [place]  . WISDOM TOOTH EXTRACTION     Family History Family History  Problem Relation Age of Onset  . Hypertension Mother   . Breast cancer Mother 82  .  Bone cancer Mother   . Colon polyps Father   . Diabetes Father        borderline  . Hypertension Father   . Hyperlipidemia Father   . Colon cancer Paternal Grandfather   . Multiple sclerosis Sister   . Alcohol abuse Sister   . Stroke Maternal Grandmother   . Lung cancer Maternal Grandmother   . Hypertension Maternal Grandmother   . Heart disease Paternal Grandmother   . Hypertension Paternal Grandmother     Social History Social History   Tobacco Use  . Smoking status: Former Smoker    Packs/day: 1.00    Years: 4.00    Pack years: 4.00    Last attempt to quit: 11/18/1998    Years since quitting: 19.0  . Smokeless tobacco: Never Used  Substance Use Topics  . Alcohol use: No    Alcohol/week: 0.0 oz  . Drug use: No   Allergies Ibuprofen; Morphine and related; Prednisone; and Adhesive [tape]  Review of Systems Review of Systems All other systems are reviewed and are negative for acute change except as noted in the HPI  Physical Exam Vital Signs  I have reviewed the triage vital signs BP 112/75 (BP Location: Left Arm)   Pulse 98   Temp 97.9 F (36.6 C) (Oral)   Resp 17   Ht 5' 4"  (1.626 m)   Wt 99.8 kg (220 lb)   SpO2 97%   BMI 37.76 kg/m  Physical Exam  Constitutional: She is oriented to person, place, and time. She appears well-developed and well-nourished. No distress.  HENT:  Head: Normocephalic and atraumatic.  Nose: Nose normal.  Eyes: Conjunctivae and EOM are normal. Pupils are equal, round, and reactive to light. Right eye exhibits no discharge. Left eye exhibits no discharge. No scleral icterus.  Neck: Normal range of motion. Neck supple.  Cardiovascular: Normal rate and regular rhythm. Exam reveals no gallop and no friction rub.  No  murmur heard. Pulmonary/Chest: Effort normal and breath sounds normal. No stridor. No respiratory distress. She has no rales.  Abdominal: Soft. She exhibits no distension. There is no tenderness.  Musculoskeletal: She exhibits no edema or tenderness.  Right leg swelling.  Larger in size when compared to the contralateral leg.  2+ DP pulses.  Discomfort with palpation of the calf, popliteal region, inguinal region.  No lymphadenopathy noted.  Neurological: She is alert and oriented to person, place, and time.  Skin: Skin is warm and dry. No rash noted. She is not diaphoretic. No erythema.  Psychiatric: She has a normal mood and affect.  Vitals reviewed.   ED Results and Treatments Labs (all labs ordered are listed, but only abnormal results are displayed) Labs Reviewed  CBC - Abnormal; Notable for the following components:      Result Value   RBC 3.75 (*)    Hemoglobin 11.4 (*)    HCT 34.9 (*)    All other components within normal limits  BASIC METABOLIC PANEL - Abnormal; Notable for the following components:   CO2 20 (*)    Calcium 8.4 (*)    All other components within normal limits  PROTIME-INR  HIV ANTIBODY (ROUTINE TESTING)  CBC  COMPREHENSIVE METABOLIC PANEL  URINALYSIS, ROUTINE W REFLEX MICROSCOPIC  HEPARIN LEVEL (UNFRACTIONATED)  I-STAT BETA HCG BLOOD, ED ([facility], [facility], [facility] ONLY)  EKG  EKG Interpretation  Date/Time:    Ventricular Rate:    PR Interval:    QRS Duration:   QT Interval:    QTC Calculation:   R Axis:     Text Interpretation:        Radiology Ct Angio Chest Pe W And/or Wo Contrast  Result Date: 11/27/2017 CLINICAL DATA:  High pretest probability for pulmonary embolism. One day of leg swelling on the right. History of DVT. EXAM: CT ANGIOGRAPHY CHEST WITH CONTRAST TECHNIQUE: Multidetector CT imaging of the chest was performed using the  standard protocol during bolus administration of intravenous contrast. Multiplanar CT image reconstructions and MIPs were obtained to evaluate the vascular anatomy. CONTRAST:  187m ISOVUE-370 IOPAMIDOL (ISOVUE-370) INJECTION 76% COMPARISON:  None. FINDINGS: Cardiovascular: Satisfactory opacification of the pulmonary arteries. There is a linear eccentric filling defect within the left arterial tree at the bifurcation of the lower lobe and lingular branches, a web-like appearance. Filling defect within posterior basal segment branch left lower lobe appears central and is age indeterminate. There is a somewhat eccentric filling defect within right lower lobe segmental branch point on 6:158, age-indeterminate. No indication of right heart strain. Heart size is normal. No pericardial effusion. Mediastinum/Nodes: Negative Lungs/Pleura: Negative for lung infarct or pulmonary edema. No pneumonia, effusion, or pneumothorax. 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 Follow-up is recommended in this former smoker to ensure that there is not a small occult underlying obstructive lesion. Upper Abdomen: Possible hepatic steatosis, but certainty limited by contrast timing. Musculoskeletal: Negative Critical Value/emergent results were called by telephone at the time of interpretation on 11/27/2017 at 4:50 pm to Dr. PAddison Lank, who verbally acknowledged these results. Review of the MIP images confirms the above findings. IMPRESSION: 1. 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 Additional small segmental pulmonary embolism in the left lower lobe which may be acute. 2. Branching tubular density in the left lower lobe compatible with bronchocele. If no outside comparison follow-up noncontrast chest CT in 3-6 months is recommended to ensure there is no underlying obstructive lesion in this former smoker. Electronically  Signed   By: JMonte FantasiaM.D.   On: 11/27/2017 16:50   Pertinent labs & imaging results that were available during my care of the patient were reviewed by me and considered in my medical decision making (see chart for details).  Medications Ordered in ED Medications  ondansetron (ZOFRAN) injection 4 mg (not administered)  alum & mag hydroxide-simeth (MAALOX/MYLANTA) 200-200-20 MG/5ML suspension 15-30 mL (not administered)  pantoprazole (PROTONIX) EC tablet 40 mg (40 mg Oral Given 11/27/17 1843)  labetalol (NORMODYNE,TRANDATE) injection 10 mg (not administered)  hydrALAZINE (APRESOLINE) injection 5 mg (not administered)  metoprolol tartrate (LOPRESSOR) injection 2-5 mg (not administered)  guaiFENesin-dextromethorphan (ROBITUSSIN DM) 100-10 MG/5ML syrup 15 mL (not administered)  phenol (CHLORASEPTIC) mouth spray 1 spray (not administered)  dextrose 5 %-0.45 % sodium chloride infusion ( Intravenous New Bag/Given 11/27/17 1953)  acetaminophen (TYLENOL) tablet 325-650 mg (not administered)    Or  acetaminophen (TYLENOL) suppository 325-650 mg (not administered)  oxyCODONE (Oxy IR/ROXICODONE) immediate release tablet 5-10 mg (10 mg Oral Given 11/27/17 1803)  morphine 4 MG/ML injection 2-5 mg (not administered)  docusate sodium (COLACE) capsule 100 mg (not administered)  diphenhydrAMINE (BENADRYL) capsule 25 mg (not administered)  aspirin tablet 325 mg (not administered)  azaTHIOprine (IMURAN) tablet 200 mg (not administered)  buPROPion (WELLBUTRIN SR) 12 hr  tablet 100 mg (not administered)  escitalopram (LEXAPRO) tablet 10 mg (not administered)  fenofibrate tablet 160 mg (160 mg Oral Given 11/27/17 1843)  ferrous sulfate tablet 325 mg (not administered)  omega-3 acid ethyl esters (LOVAZA) capsule 1 g (not administered)  heparin ADULT infusion 100 units/mL (25000 units/234m sodium chloride 0.45%) (1,400 Units/hr Intravenous New Bag/Given 11/27/17 1832)  iopamidol (ISOVUE-370) 76 % injection  (100 mLs  Contrast Given 11/27/17 1618)  potassium chloride SA (K-DUR,KLOR-CON) CR tablet 20-40 mEq (20 mEq Oral Given 11/27/17 1843)  heparin bolus via infusion 4,500 Units (4,500 Units Intravenous Bolus from Bag 11/27/17 1834)                                                                                                                                    Procedures Procedures CRITICAL CARE Performed by: PGrayce SessionsCardama Total critical care time: 30 minutes Critical care time was exclusive of separately billable procedures and treating other patients. Critical care was necessary to treat or prevent imminent or life-threatening deterioration. Critical care was time spent personally by me on the following activities: development of treatment plan with patient and/or surrogate as well as nursing, discussions with consultants, evaluation of patient's response to treatment, examination of patient, obtaining history from patient or surrogate, ordering and performing treatments and interventions, ordering and review of laboratory studies, ordering and review of radiographic studies, pulse oximetry and re-evaluation of patient's condition.   (including critical care time)  Medical Decision Making / ED Course I have reviewed the nursing notes for this encounter and the patient's prior records (if available in EHR or on provided paperwork).    Workup revealed large right DVT to the common iliac, profundus and femoral veins.  Patient also noted to have chronic PEs with a possible new pulmonary embolism.  Labs are grossly reassuring.  Heparin started.  Case is discussed with vascular surgery who will admit the patient for continued management and possible intervascular thrombolysis.    Final Clinical Impression(s) / ED Diagnoses Final diagnoses:  Other acute pulmonary embolism without acute cor pulmonale (HCC)  DVT, lower extremity, proximal, acute, right ([place]     This chart was dictated  using voice recognition software.  Despite best efforts to proofread,  errors can occur which can change the documentation meaning.   CFatima Blank MD 11/27/17 2144

## 2017-11-27 NOTE — H&P (Signed)
Referring Physician: Dr Leonette Monarch  Patient name: Lindsay Mccarthy MRN: [id number] DOB: [birth year] Sex: female  REASON FOR CONSULT: DVT with Pe  HPI: Lindsay Mccarthy is a 42 y.o. female with fairly sudden onset of right leg swelling about 2 days ago.  She has had prior superficial venous thrombosis in the left leg but denies PE.  She also has been a little more short of breath with some pleuritic chest pain.  She denies hemoptysis.  She has no recent travel or immobilization history.  She does have Crohn's disease and is actively having a flare now.  She denies personal or family history of hypercoagulable state.  No recent GI or neuro bleed.  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 She does not take oral contraceptives.  She has an IUD.  Past Medical History:  Diagnosis Date  . Anemia    related to Crohns flares  . Anxiety   . Arthritis    knees, feet, hands, wrists, related to Crohns flare  . Asthma    Childhood  . B12 deficiency    monitored/treated by Dr. Carlean Purl  . Cervical dysplasia   . Crohn's disease of small intestine ([place]) 06/24/2012   Diagnosed 1999, in [place]. Ileitis only then. Treated with Imuran Remicade and prednisone.  Noncompliant with therapy. 2004 return to care and was treated with Remicade prednisone Pentasa Cipro and Flagyl. 2006 status post right hemicolectomy. Subsequently treated with azathioprine and Cimzia. 200 mg every other week.  azathioprine was added in 2010. Prometheus TP MT enzyme was negative. Has ha  . Depression   . Eczema    arms and behind knees, worse in winter  . History of recurrent UTI (urinary tract infection)   . HPV (human papilloma virus) infection   . Hyperlipemia   . Osteopenia of femur neck T. score -1.4 09/29/2011   09/2013 T-1.6  . Papanicolaou smear 12/12   last abnormal 2011  . Scaphoid fracture of wrist 2013   left  . Seasonal allergic rhinitis   . Shingles 09/2015   R hip  . Wears  glasses    Past Surgical History:  Procedure Laterality Date  . APPENDECTOMY    . CERVICAL BIOPSY  W/ LOOP ELECTRODE EXCISION  2009   ---paps normal since  . COLONOSCOPY  multiple   scanned  . ESOPHAGOGASTRODUODENOSCOPY  multiple   scanned  . HEMICOLECTOMY  2006  . LEEP  2011   --done in [place]  . WISDOM TOOTH EXTRACTION    Small bowel resection for Crohn's  Family History  Problem Relation Age of Onset  . Hypertension Mother   . Breast cancer Mother 110  . Bone cancer Mother   . Colon polyps Father   . Diabetes Father        borderline  . Hypertension Father   . Hyperlipidemia Father   . Colon cancer Paternal Grandfather   . Multiple sclerosis Sister   . Alcohol abuse Sister   . Stroke Maternal Grandmother   . Lung cancer Maternal Grandmother   . Hypertension Maternal Grandmother   . Heart disease Paternal Grandmother   . Hypertension Paternal Grandmother     SOCIAL HISTORY: Social History   Socioeconomic History  . Marital status: Married    Spouse name: Not on file  . Number of children: 0  . Years of education: Not on file  . Highest education level: Not on file  Social Needs  . Emergency planning/management officer  strain: Not on file  . Food insecurity - worry: Not on file  . Food insecurity - inability: Not on file  . Transportation needs - medical: Not on file  . Transportation needs - non-medical: Not on file  Occupational History  . Occupation: Teacher, early years/pre  Tobacco Use  . Smoking status: Former Smoker    Packs/day: 1.00    Years: 4.00    Pack years: 4.00    Last attempt to quit: 11/18/1998    Years since quitting: 19.0  . Smokeless tobacco: Never Used  Substance and Sexual Activity  . Alcohol use: No    Alcohol/week: 0.0 oz  . Drug use: No  . Sexual activity: Yes    Partners: Male    Birth control/protection: IUD    Comment: Mirena inserted 01/2016  Other Topics Concern  . Not on file  Social History Narrative   The patient is divorced.      Re-married Marcello Moores, partner of 10 years, on 10/10/16. 1 cats, 1 dog   No children - doesn't want any   [place].   Moved from [place] to [place] in 2013.   Past smoker   No alcohol   2 caffeinated beverages a day   She reports she is compliant with sunscreen given her increased risk of sun damage and skin cancer on azathioprine      Updated 12/15/16    Allergies  Allergen Reactions  . Adhesive [Tape]     Rash with electrodes  . Ibuprofen     Avoids due to Crohns disease  . Morphine And Related     itchy  . Prednisone     "crazy", hallucinatory    Current Facility-Administered Medications  Medication Dose Route Frequency Provider Last Rate Last Dose  . acetaminophen (TYLENOL) tablet 325-650 mg  325-650 mg Oral Q4H PRN Elam Dutch, MD       Or  . acetaminophen (TYLENOL) suppository 325-650 mg  325-650 mg Rectal Q4H PRN Elam Dutch, MD      . alum & mag hydroxide-simeth (MAALOX/MYLANTA) 200-200-20 MG/5ML suspension 15-30 mL  15-30 mL Oral Q2H PRN Elam Dutch, MD      . aspirin tablet 325 mg  325 mg Oral Daily Sahan Pen, Jessy Oto, MD      . azaTHIOprine (IMURAN) tablet 200 mg  200 mg Oral Daily Dawnelle Warman, Jessy Oto, MD      . buPROPion [facility] SR) 12 hr tablet 100 mg  100 mg Oral BID Elam Dutch, MD      . Calcium-Vitamin D-Vitamin K 500-100-40 MG-UNT-MCG CHEW 1 tablet  1 tablet Oral BID Elam Dutch, MD      . dextrose 5 %-0.45 % sodium chloride infusion   Intravenous Continuous Shayaan Parke, Jessy Oto, MD      . diphenhydrAMINE (BENADRYL) capsule 25 mg  25 mg Oral Q6H PRN Elam Dutch, MD      . docusate sodium (COLACE) capsule 100 mg  100 mg Oral BID Elam Dutch, MD      . escitalopram (LEXAPRO) tablet 10 mg  10 mg Oral Daily Kandon Hosking, Jessy Oto, MD      . fenofibrate tablet 160 mg  160 mg Oral Daily Elam Dutch, MD      . Derrill Memo ON 11/28/2017] ferrous sulfate tablet 325 mg  325 mg Oral Q breakfast Jacki Couse, Jessy Oto, MD      . guaiFENesin-dextromethorphan (ROBITUSSIN DM) 100-10 MG/5ML syrup 15 mL  15 mL Oral Q4H PRN Elam Dutch, MD      . hydrALAZINE (APRESOLINE) injection 5 mg  5 mg Intravenous Q20 Min PRN Elam Dutch, MD      . labetalol (NORMODYNE,TRANDATE) injection 10 mg  10 mg Intravenous Q10 min PRN Elam Dutch, MD      . metoprolol tartrate (LOPRESSOR) injection 2-5 mg  2-5 mg Intravenous Q2H PRN Elam Dutch, MD      . morphine 4 MG/ML injection 2-5 mg  2-5 mg Intravenous Q1H PRN Elam Dutch, MD      . omega-3 acid ethyl esters (LOVAZA) capsule 1 g  1 g Oral Daily Eilis Chestnutt E, MD      . ondansetron [facility]) injection 4 mg  4 mg Intravenous Q6H PRN Elam Dutch, MD      . oxyCODONE (Oxy IR/ROXICODONE) immediate release tablet 5-10 mg  5-10 mg Oral Q4H PRN Elam Dutch, MD      . pantoprazole (PROTONIX) EC tablet 40 mg  40 mg Oral Daily Victory Dresden E, MD      . phenol (CHLORASEPTIC) mouth spray 1 spray  1 spray Mouth/Throat PRN Elam Dutch, MD      . potassium chloride SA (K-DUR,KLOR-CON) CR tablet 20-40 mEq  20-40 mEq Oral Once Elam Dutch, MD       Current Outpatient Medications  Medication Sig Dispense Refill  . acetaminophen (TYLENOL) 500 MG tablet Take 1,000 mg by mouth every 6 (six) hours as needed. Reported on 12/15/2015    . aspirin 325 MG tablet Take 325 mg by mouth daily.    Marland Kitchen azaTHIOprine (IMURAN) 50 MG tablet Take 4 tablets (200 mg total) by mouth daily. 360 tablet 1  . azaTHIOprine (IMURAN) 50 MG tablet Take 4 tablets by mouth every day 120 tablet 2  . buPROPion (WELLBUTRIN SR) 100 MG 12 hr tablet TAKE 1 TABLET BY MOUTH TWICE A DAY 180 tablet 0  . Calcium-Vitamin D-Vitamin K (CALCIUM SOFT CHEWS) 500-100-40 MG-UNT-MCG CHEW Chew 1 tablet by mouth 2 (two) times daily.    . celecoxib (CELEBREX) 200 MG capsule Take 1 capsule (200 mg total) by mouth 2 (two) times daily as needed. 60 capsule 1  . cetirizine (ZYRTEC) 10 MG tablet  Take 10 mg by mouth daily.    . cyanocobalamin (,VITAMIN B-12,) 1000 MCG/ML injection INJECT AS DIRECTED EVERY 30 DAYS 3 mL 4  . diphenhydrAMINE (SOMINEX) 25 MG tablet Take 25 mg by mouth at bedtime as needed for sleep.    Marland Kitchen escitalopram (LEXAPRO) 10 MG tablet TAKE 1 TABLET (10 MG TOTAL) BY MOUTH DAILY. 90 tablet 0  . fenofibrate 160 MG tablet Take 1 tablet (160 mg total) by mouth daily. 30 tablet 10  . ferrous sulfate 325 (65 FE) MG tablet Take 1-2 tablets daily with food - at least 1x/day  3  . levonorgestrel (MIRENA) 20 MCG/24HR IUD 1 each by Intrauterine route once.    . Omega-3 Fatty Acids (FISH OIL) 1000 MG CAPS Take 2 capsules by mouth daily.    . Vedolizumab (ENTYVIO IV) Inject into the vein.      ROS:   General:  No weight loss, Fever, chills  HEENT: No recent headaches, no nasal bleeding, no visual changes, no sore throat  Neurologic: No dizziness, blackouts, seizures. No recent symptoms of stroke or mini- stroke. No recent episodes of slurred speech, or temporary blindness.  Cardiac: No recent episodes of chest pain/pressure, no shortness of  breath at rest.  + shortness of breath with exertion.  Denies history of atrial fibrillation or irregular heartbeat  Vascular: No history of rest pain in feet.  No history of claudication.  No history of non-healing ulcer, No history of DVT   Pulmonary: No home oxygen, no productive cough, no hemoptysis,  No asthma or wheezing  Musculoskeletal:  [ ]  Arthritis, [X]  Low back pain,  [ ]  Joint pain  Hematologic:No history of hypercoagulable state.  No history of easy bleeding.  + history of anemia  Gastrointestinal: No hematochezia or melena,  No gastroesophageal reflux, no trouble swallowing  Urinary: [ ]  chronic Kidney disease, [ ]  on HD - [ ]  MWF or [ ]  TTHS, [ ]  Burning with urination, [ ]  Frequent urination, [ ]  Difficulty urinating;   Skin: No rashes  Psychological: No history of anxiety,  No history of depression   Physical  Examination  Vitals:   11/27/17 1337  BP: 114/77  Pulse: (!) 101  Resp: 16  Temp: 98.9 F (37.2 C)  TempSrc: Oral  SpO2: 95%  Weight: 220 lb (99.8 kg)  Height: 5' 4"  (1.626 m)    Body mass index is 37.76 kg/m.  General:  Alert and oriented, no acute distress HEENT: Normal Neck: No bruit or JVD Pulmonary: Clear to auscultation bilaterally Cardiac: Regular Rate and Rhythm  Abdomen: Soft, non-tender, non-distended, no mass, well healed midline scar, obese Skin: No rash Extremity Pulses:  2+ radial, brachial, femoral, dorsalis pedis, posterior tibial pulses bilaterally Musculoskeletal: Right leg edema 20% larger than right diffuse extending from hip to foot with venous collaterals  Neurologic: Upper and lower extremity motor 5/5 and symmetric  DATA:  CTA chest chronic and acute PE Duplex DVT femoral to calf iliac not visualized due to body habitus  ASSESSMENT:  Acute extensive DVT right leg  Acute PE no hemodynamic compromise   PLAN:  1. Admit for heparin  2. Most likely thrombolysis of right leg on Tuesday unless clinical condition changes   Ruta Hinds, MD [facility] [place]: [phone] Pager: [phone]

## 2017-11-27 NOTE — ED Triage Notes (Signed)
Patient complains of 1 day of right leg swelling and pain inner groin since yesterday, pain worse with ambulation. Reports hx of DVT, no SOB, NAD

## 2017-11-28 DIAGNOSIS — I82629 Acute embolism and thrombosis of deep veins of unspecified upper extremity: Secondary | ICD-10-CM

## 2017-11-28 HISTORY — DX: Acute embolism and thrombosis of deep veins of unspecified upper extremity: I82.629

## 2017-11-28 LAB — COMPREHENSIVE METABOLIC PANEL
ALK PHOS: 36 U/L — AB (ref 38–126)
ALT: 12 U/L — AB (ref 14–54)
AST: 19 U/L (ref 15–41)
Albumin: 2.9 g/dL — ABNORMAL LOW (ref 3.5–5.0)
Anion gap: 10 (ref 5–15)
BILIRUBIN TOTAL: 0.3 mg/dL (ref 0.3–1.2)
BUN: 7 mg/dL (ref 6–20)
CALCIUM: 8.1 mg/dL — AB (ref 8.9–10.3)
CO2: 23 mmol/L (ref 22–32)
CREATININE: 0.9 mg/dL (ref 0.44–1.00)
Chloride: 103 mmol/L (ref 101–111)
Glucose, Bld: 131 mg/dL — ABNORMAL HIGH (ref 65–99)
Potassium: 3.8 mmol/L (ref 3.5–5.1)
SODIUM: 136 mmol/L (ref 135–145)
TOTAL PROTEIN: 6.2 g/dL — AB (ref 6.5–8.1)

## 2017-11-28 LAB — CBC
HEMATOCRIT: 32.7 % — AB (ref 36.0–46.0)
HEMOGLOBIN: 10.5 g/dL — AB (ref 12.0–15.0)
MCH: 30.3 pg (ref 26.0–34.0)
MCHC: 32.1 g/dL (ref 30.0–36.0)
MCV: 94.2 fL (ref 78.0–100.0)
Platelets: 282 10*3/uL (ref 150–400)
RBC: 3.47 MIL/uL — AB (ref 3.87–5.11)
RDW: 15.8 % — ABNORMAL HIGH (ref 11.5–15.5)
WBC: 10.9 10*3/uL — ABNORMAL HIGH (ref 4.0–10.5)

## 2017-11-28 LAB — HEPARIN LEVEL (UNFRACTIONATED)
HEPARIN UNFRACTIONATED: 0.47 [IU]/mL (ref 0.30–0.70)
Heparin Unfractionated: 0.22 IU/mL — ABNORMAL LOW (ref 0.30–0.70)

## 2017-11-28 MED ORDER — SODIUM CHLORIDE 0.45 % IV SOLN
INTRAVENOUS | Status: DC
  Administered 2017-11-28 – 2017-11-29 (×3): via INTRAVENOUS

## 2017-11-28 MED ORDER — HEPARIN BOLUS VIA INFUSION
3000.0000 [IU] | Freq: Once | INTRAVENOUS | Status: AC
  Administered 2017-11-28: 3000 [IU] via INTRAVENOUS
  Filled 2017-11-28: qty 3000

## 2017-11-28 NOTE — Progress Notes (Addendum)
  Progress Note    11/28/2017 7:12 AM Hospital Day 1  Subjective:  Denies shortness of breath and breathing ok.  Says her leg is okay unless she moves it then she has pain.  Afebrile HR  80's-100's NSR 830'N-407'W systolic 80% RA  Vitals:   11/27/17 2349 11/28/17 0459  BP: 110/68 106/73  Pulse: 91 90  Resp: 12 16  Temp: 98.8 F (37.1 C) 98.6 F (37 C)  SpO2: 95% 96%    Physical Exam: Cardiac:  regular Lungs:  Non labored Extremities:  Significant swelling RLE; faintly palpable right DP pulse.  CBC    Component Value Date/Time   WBC 10.4 11/27/2017 1347   RBC 3.75 (L) 11/27/2017 1347   HGB 11.4 (L) 11/27/2017 1347   HCT 34.9 (L) 11/27/2017 1347   PLT 281 11/27/2017 1347   MCV 93.1 11/27/2017 1347   MCH 30.4 11/27/2017 1347   MCHC 32.7 11/27/2017 1347   RDW 15.4 11/27/2017 1347   LYMPHSABS 1.6 10/21/2017 1551   MONOABS 0.8 10/21/2017 1551   EOSABS 0.2 10/21/2017 1551   BASOSABS 0.0 10/21/2017 1551    BMET    Component Value Date/Time   NA 136 11/27/2017 1347   K 3.7 11/27/2017 1347   CL 106 11/27/2017 1347   CO2 20 (L) 11/27/2017 1347   GLUCOSE 93 11/27/2017 1347   BUN 10 11/27/2017 1347   CREATININE 0.84 11/27/2017 1347   CREATININE 0.81 12/15/2015 0001   CALCIUM 8.4 (L) 11/27/2017 1347   GFRNONAA >60 11/27/2017 1347   GFRAA >60 11/27/2017 1347    INR    Component Value Date/Time   INR 1.21 11/27/2017 1841     Intake/Output Summary (Last 24 hours) at 11/28/2017 8811 Last data filed at 11/28/2017 0315 Gross per 24 hour  Intake 1377.3 ml  Output 200 ml  Net 1177.3 ml     Assessment/Plan:  42 y.o. female with DVT/PE Hospital Day 1  -pt with significant swelling RLE-plan for thrombolysis tomorrow  -breathing fine without tachypnea or shortness of breath -continue heparin per pharmacy   Leontine Locket, PA-C [facility] [phone] 11/28/2017 7:12 AM   Still with right leg swelling.  Foot viable.  She denies any  prior intracranial bleed.  She has had some bleeding in the past with her Crohn's but not with this current flareup  Lysis tomorrow.  IV hydration today to reduce risk of renal dysfunction  Ruta Hinds, MD [facility] [place] Office: [phone] Pager: [phone]

## 2017-11-28 NOTE — Progress Notes (Signed)
   Pre-op orders for  thrombolysis tomorrow right LE with DR. Brabham.  NPO past MN  [employer] PA-C

## 2017-11-28 NOTE — Progress Notes (Signed)
Patient back from bathroom and H.R. Coming down 97 S.R.

## 2017-11-28 NOTE — Progress Notes (Signed)
Patient in bathroom and H.R. Up 140's S.T.

## 2017-11-28 NOTE — Progress Notes (Signed)
[place] for heparin Indication: pulmonary embolus and DVT  Allergies  Allergen Reactions  . Ibuprofen Other (See Comments)    Avoids due to Crohns disease  . Morphine And Related Itching    itchy  . Prednisone Other (See Comments)    "crazy", hallucinations  . Adhesive [Tape] Rash    Rash with electrodes    Patient Measurements: Height: 5' 4"  (162.6 cm) Weight: 220 lb (99.8 kg) IBW/kg (Calculated) : 54.7 Heparin Dosing Weight: 77.8kg  Vital Signs: Temp: 98.6 F (37 C) (01/28 0459) Temp Source: Oral (01/28 0459) BP: 106/73 (01/28 0459) Pulse Rate: 90 (01/28 0459)  Labs: Recent Labs    11/27/17 1347 11/27/17 1841 11/27/17 2348 11/28/17 0806  HGB 11.4*  --   --  10.5*  HCT 34.9*  --   --  32.7*  PLT 281  --   --  282  LABPROT  --  15.2  --   --   INR  --  1.21  --   --   HEPARINUNFRC  --   --  0.22* 0.47  CREATININE 0.84  --   --   --     Estimated Creatinine Clearance: 103.2 mL/min (by C-G formula based on SCr of 0.84 mg/dL).   Medical History: Past Medical History:  Diagnosis Date  . Anemia    related to Crohns flares  . Anxiety   . Arthritis    knees, feet, hands, wrists, related to Crohns flare  . Asthma    Childhood  . B12 deficiency    monitored/treated by Dr. Carlean Purl  . Cervical dysplasia   . Crohn's disease of small intestine ([place]) 06/24/2012   Diagnosed 1999, in [place]. Ileitis only then. Treated with Imuran Remicade and prednisone.  Noncompliant with therapy. 2004 return to care and was treated with Remicade prednisone Pentasa Cipro and Flagyl. 2006 status post right hemicolectomy. Subsequently treated with azathioprine and Cimzia. 200 mg every other week.  azathioprine was added in 2010. Prometheus TP MT enzyme was negative. Has ha  . Depression   . Eczema    arms and behind knees, worse in winter  . History of recurrent UTI (urinary tract infection)   . HPV (human papilloma virus) infection   .  Hyperlipemia   . Osteopenia of femur neck T. score -1.4 09/29/2011   09/2013 T-1.6  . Papanicolaou smear 12/12   last abnormal 2011  . Scaphoid fracture of wrist 2013   left  . Seasonal allergic rhinitis   . Shingles 09/2015   R hip  . Wears glasses    Assessment: 32 yof presented to the ED with RLE pain and swelling. Found to have a DVT and PE. To start IV heparin. Baseline H/H is slightly low but platelets are WNL. She is not on anticoagulation PTA.   Heparin level at goal this morning at 0.47. CBC stable, plan for thrombolysis in am.   Goal of Therapy:  Heparin level 0.3-0.7 units/ml Monitor platelets by anticoagulation protocol: Yes   Plan:  Heparin infusion at 1700 units/hr Daily heparin level and CBC F/u plans for long-term [facility]  Erin Hearing PharmD., BCPS Clinical Pharmacist Pager [phone] 11/28/2017 9:51 AM

## 2017-11-28 NOTE — Progress Notes (Signed)
ANTICOAGULATION CONSULT NOTE - Follow Up Consult  Pharmacy Consult for heparin Indication: PE/DVT  Labs: Recent Labs    11/27/17 1347 11/27/17 1841 11/27/17 2348  HGB 11.4*  --   --   HCT 34.9*  --   --   PLT 281  --   --   LABPROT  --  15.2  --   INR  --  1.21  --   HEPARINUNFRC  --   --  0.22*  CREATININE 0.84  --   --     Assessment: 41yo female subtherapeutic on heparin with initial dosing for PE/DVT.   Goal of Therapy:  Heparin level 0.3-0.7 units/ml   Plan:  Will rebolus with heparin 3000 units and increase heparin gtt by ~3 units/kg/hr to 1700 units/hr and check level in 6 hours.   Wynona Neat, PharmD, BCPS  11/28/2017,12:53 AM

## 2017-11-29 ENCOUNTER — Encounter (HOSPITAL_COMMUNITY): Payer: Self-pay | Admitting: Surgery

## 2017-11-29 ENCOUNTER — Inpatient Hospital Stay (HOSPITAL_COMMUNITY)
Admission: EM | Disposition: A | Discharge status: Home / Self Care | Payer: Self-pay | Source: Home / Self Care | Attending: Surgery

## 2017-11-29 DIAGNOSIS — I82621 Acute embolism and thrombosis of deep veins of right upper extremity: Secondary | ICD-10-CM

## 2017-11-29 HISTORY — DX: Acute embolism and thrombosis of deep veins of right upper extremity: I82.621

## 2017-11-29 HISTORY — PX: PERIPHERAL VASCULAR THROMBECTOMY: CATH118306

## 2017-11-29 LAB — BASIC METABOLIC PANEL
ANION GAP: 8 (ref 5–15)
BUN: 6 mg/dL (ref 6–20)
CALCIUM: 7.7 mg/dL — AB (ref 8.9–10.3)
CO2: 24 mmol/L (ref 22–32)
CREATININE: 0.9 mg/dL (ref 0.44–1.00)
Chloride: 103 mmol/L (ref 101–111)
GFR calc Af Amer: >60 mL/min (ref >60)
GLUCOSE: 96 mg/dL (ref 65–99)
Potassium: 3.5 mmol/L (ref 3.5–5.1)
Sodium: 135 mmol/L (ref 135–145)

## 2017-11-29 LAB — HIV ANTIBODY (ROUTINE TESTING W REFLEX): HIV Screen 4th Generation wRfx: NONREACTIVE

## 2017-11-29 LAB — CBC
HCT: 30.2 % — ABNORMAL LOW (ref 36.0–46.0)
HCT: 29.6 % — ABNORMAL LOW (ref 36.0–46.0)
HEMATOCRIT: 27.2 % — AB (ref 36.0–46.0)
HEMOGLOBIN: 9.7 g/dL — AB (ref 12.0–15.0)
HEMOGLOBIN: 9.1 g/dL — AB (ref 12.0–15.0)
HEMOGLOBIN: 9.7 g/dL — AB (ref 12.0–15.0)
MCH: 30.4 pg (ref 26.0–34.0)
MCH: 31.5 pg (ref 26.0–34.0)
MCH: 30.8 pg (ref 26.0–34.0)
MCHC: 32.1 g/dL (ref 30.0–36.0)
MCHC: 33.5 g/dL (ref 30.0–36.0)
MCHC: 32.8 g/dL (ref 30.0–36.0)
MCV: 94.7 fL (ref 78.0–100.0)
MCV: 94.1 fL (ref 78.0–100.0)
MCV: 94 fL (ref 78.0–100.0)
PLATELETS: 279 10*3/uL (ref 150–400)
Platelets: 263 10*3/uL (ref 150–400)
Platelets: 266 10*3/uL (ref 150–400)
RBC: 3.19 MIL/uL — ABNORMAL LOW (ref 3.87–5.11)
RBC: 2.89 MIL/uL — ABNORMAL LOW (ref 3.87–5.11)
RBC: 3.15 MIL/uL — AB (ref 3.87–5.11)
RDW: 15.7 % — ABNORMAL HIGH (ref 11.5–15.5)
RDW: 15.6 % — ABNORMAL HIGH (ref 11.5–15.5)
RDW: 15.4 % (ref 11.5–15.5)
WBC: 9.7 10*3/uL (ref 4.0–10.5)
WBC: 9.5 10*3/uL (ref 4.0–10.5)
WBC: 10.6 10*3/uL — ABNORMAL HIGH (ref 4.0–10.5)

## 2017-11-29 LAB — FIBRINOGEN
Fibrinogen: 589 mg/dL — ABNORMAL HIGH (ref 210–475)
Fibrinogen: 605 mg/dL — ABNORMAL HIGH (ref 210–475)

## 2017-11-29 LAB — POCT ACTIVATED CLOTTING TIME: ACTIVATED CLOTTING TIME: 103 s

## 2017-11-29 LAB — HEPARIN LEVEL (UNFRACTIONATED)
HEPARIN UNFRACTIONATED: 0.42 [IU]/mL (ref 0.30–0.70)
Heparin Unfractionated: 0.3 IU/mL (ref 0.30–0.70)
Heparin Unfractionated: 1.68 IU/mL — ABNORMAL HIGH (ref 0.30–0.70)

## 2017-11-29 LAB — PROTIME-INR
INR: 1.09
PROTHROMBIN TIME: 14.1 s (ref 11.4–15.2)

## 2017-11-29 SURGERY — PERIPHERAL VASCULAR THROMBECTOMY
Anesthesia: LOCAL

## 2017-11-29 MED ORDER — HEPARIN (PORCINE) IN NACL 2-0.9 UNIT/ML-% IJ SOLN
INTRAMUSCULAR | Status: AC | PRN
  Administered 2017-11-29 (×2): 500 mL

## 2017-11-29 MED ORDER — SODIUM CHLORIDE 0.9 % IV SOLN
INTRAVENOUS | Status: AC | PRN
  Administered 2017-11-29: 0.5 mg/h

## 2017-11-29 MED ORDER — MIDAZOLAM HCL 2 MG/2ML IJ SOLN
INTRAMUSCULAR | Status: AC
  Filled 2017-11-29: qty 2

## 2017-11-29 MED ORDER — HYDRALAZINE HCL 20 MG/ML IJ SOLN: 5.0000 mg | INTRAMUSCULAR | Status: DC | PRN

## 2017-11-29 MED ORDER — FENTANYL CITRATE (PF) 100 MCG/2ML IJ SOLN
25.0000 ug | INTRAMUSCULAR | Status: DC | PRN
Start: 2017-11-29 — End: 2017-12-04
  Administered 2017-11-29 – 2017-12-04 (×8): 25 ug via INTRAVENOUS
  Filled 2017-11-29 (×7): qty 2

## 2017-11-29 MED ORDER — HEPARIN SODIUM (PORCINE) 1000 UNIT/ML IJ SOLN
INTRAMUSCULAR | Status: DC | PRN
  Administered 2017-11-29: 5000 [IU] via INTRAVENOUS

## 2017-11-29 MED ORDER — SODIUM CHLORIDE 0.9 % IV SOLN
INTRAVENOUS | Status: DC
  Filled 2017-11-29: qty 20

## 2017-11-29 MED ORDER — LIDOCAINE HCL (PF) 1 % IJ SOLN
INTRAMUSCULAR | Status: DC | PRN
  Administered 2017-11-29: 20 mL via INTRADERMAL

## 2017-11-29 MED ORDER — SODIUM CHLORIDE 0.9 % IV SOLN
0.5000 mg/h | INTRAVENOUS | Status: DC
  Administered 2017-11-29: 0.5 mg/h
  Filled 2017-11-29 (×3): qty 10

## 2017-11-29 MED ORDER — HEPARIN SODIUM (PORCINE) 1000 UNIT/ML IJ SOLN
INTRAMUSCULAR | Status: AC
  Filled 2017-11-29: qty 1

## 2017-11-29 MED ORDER — HEPARIN (PORCINE) IN NACL 2-0.9 UNIT/ML-% IJ SOLN
INTRAMUSCULAR | Status: AC
  Filled 2017-11-29: qty 500

## 2017-11-29 MED ORDER — FENTANYL CITRATE (PF) 100 MCG/2ML IJ SOLN
INTRAMUSCULAR | Status: DC | PRN
  Administered 2017-11-29: 50 ug via INTRAVENOUS
  Administered 2017-11-29 (×2): 25 ug via INTRAVENOUS

## 2017-11-29 MED ORDER — LABETALOL HCL 5 MG/ML IV SOLN: 10.0000 mg | INTRAVENOUS | Status: DC | PRN

## 2017-11-29 MED ORDER — SODIUM CHLORIDE 0.9% FLUSH
3.0000 mL | Freq: Two times a day (BID) | INTRAVENOUS | Status: DC
  Administered 2017-11-29 – 2017-12-05 (×4): 3 mL via INTRAVENOUS

## 2017-11-29 MED ORDER — SODIUM CHLORIDE 0.9% FLUSH: 3.0000 mL | INTRAVENOUS | Status: DC | PRN

## 2017-11-29 MED ORDER — SODIUM CHLORIDE 0.9 % IV SOLN
INTRAVENOUS | Status: DC
  Filled 2017-11-29: qty 500

## 2017-11-29 MED ORDER — IODIXANOL 320 MG/ML IV SOLN
INTRAVENOUS | Status: DC | PRN
  Administered 2017-11-29: 3 mL via INTRAVENOUS

## 2017-11-29 MED ORDER — FENTANYL CITRATE (PF) 100 MCG/2ML IJ SOLN
INTRAMUSCULAR | Status: AC
  Filled 2017-11-29: qty 2

## 2017-11-29 MED ORDER — MIDAZOLAM HCL 2 MG/2ML IJ SOLN
INTRAMUSCULAR | Status: DC | PRN
  Administered 2017-11-29: 1 mg via INTRAVENOUS
  Administered 2017-11-29: 2 mg via INTRAVENOUS
  Administered 2017-11-29: 1 mg via INTRAVENOUS

## 2017-11-29 MED ORDER — LIDOCAINE HCL 1 % IJ SOLN
INTRAMUSCULAR | Status: AC
  Filled 2017-11-29: qty 20

## 2017-11-29 MED ORDER — HEPARIN (PORCINE) IN NACL 100-0.45 UNIT/ML-% IJ SOLN
2000.0000 [IU]/h | INTRAMUSCULAR | Status: AC
  Administered 2017-11-30: 1900 [IU]/h via INTRAVENOUS
  Administered 2017-11-30: 1800 [IU]/h via INTRAVENOUS
  Administered 2017-12-03 – 2017-12-05 (×4): 2000 [IU]/h via INTRAVENOUS
  Filled 2017-11-29 (×16): qty 250

## 2017-11-29 MED ORDER — ONDANSETRON HCL 4 MG/2ML IJ SOLN: 4.0000 mg | Freq: Four times a day (QID) | INTRAMUSCULAR | Status: DC | PRN

## 2017-11-29 MED ORDER — SODIUM CHLORIDE 0.9 % IV SOLN: 250.0000 mL | INTRAVENOUS | Status: DC | PRN

## 2017-11-29 SURGICAL SUPPLY — 10 items
CATH ANGIO 5F BER2 100CM (CATHETERS) ×2 IMPLANT
CATH INFUS 135CMX50CM (CATHETERS) ×2 IMPLANT
CATH VISIONS PV .035 IVUS (CATHETERS) ×2 IMPLANT
COVER PRB 48X5XTLSCP FOLD TPE (BAG) ×1 IMPLANT
COVER PROBE 5X48 (BAG) ×1
KIT MICROINTRODUCER STIFF 5F (SHEATH) ×2 IMPLANT
SHEATH PINNACLE 8F 10CM (SHEATH) ×2 IMPLANT
SHIELD RADPAD SCOOP 12X17 (MISCELLANEOUS) ×2 IMPLANT
WIRE AMPLATZ SS-J .035X260CM (WIRE) ×2 IMPLANT
WIRE BENTSON .035X145CM (WIRE) ×2 IMPLANT

## 2017-11-29 NOTE — Progress Notes (Addendum)
Addendum:  S/p IVUS, placement of lysis catheter and tPA started with plans to continue overnight. Likely take back to OR tomorrow.   Heparin is to be continued with tPA, new heparin level goal 0.2 - 0.5 while on concomitant tPA. Will recheck level this evening to ensure within range as a small rate adjustment was made this morning prior to OR. 11/29/2017 2:17 PM  ANTICOAGULATION CONSULT NOTE   Pharmacy Consult for heparin Indication: pulmonary embolus and DVT  Allergies  Allergen Reactions  . Ibuprofen Other (See Comments)    Avoids due to Crohns disease  . Morphine And Related Itching    itchy  . Prednisone Other (See Comments)    "crazy", hallucinations  . Adhesive [Tape] Rash    Rash with electrodes    Patient Measurements: Height: 5' 4"  (162.6 cm) Weight: 220 lb (99.8 kg) IBW/kg (Calculated) : 54.7 Heparin Dosing Weight: 77.8kg  Vital Signs: Temp: 99.3 F (37.4 C) (01/29 0406) Temp Source: Oral (01/29 0406) BP: 133/64 (01/29 0406) Pulse Rate: 105 (01/29 0800)  Labs: Recent Labs    11/27/17 1347 11/27/17 1841 11/27/17 2348 11/28/17 0806 11/29/17 0241  HGB 11.4*  --   --  10.5* 9.7*  HCT 34.9*  --   --  32.7* 30.2*  PLT 281  --   --  282 279  LABPROT  --  15.2  --   --  14.1  INR  --  1.21  --   --  1.09  HEPARINUNFRC  --   --  0.22* 0.47 0.30  CREATININE 0.84  --   --  0.90 0.90    Estimated Creatinine Clearance: 96.3 mL/min (by C-G formula based on SCr of 0.9 mg/dL).   Medical History: Past Medical History:  Diagnosis Date  . Anemia    related to Crohns flares  . Anxiety   . Arthritis    knees, feet, hands, wrists, related to Crohns flare  . Asthma    Childhood  . B12 deficiency    monitored/treated by Dr. Carlean Purl  . Cervical dysplasia   . Crohn's disease of small intestine ([place]) 06/24/2012   Diagnosed 1999, in [place]. Ileitis only then. Treated with Imuran Remicade and prednisone.  Noncompliant with therapy. 2004 return to care and was  treated with Remicade prednisone Pentasa Cipro and Flagyl. 2006 status post right hemicolectomy. Subsequently treated with azathioprine and Cimzia. 200 mg every other week.  azathioprine was added in 2010. Prometheus TP MT enzyme was negative. Has ha  . Depression   . Eczema    arms and behind knees, worse in winter  . History of recurrent UTI (urinary tract infection)   . HPV (human papilloma virus) infection   . Hyperlipemia   . Osteopenia of femur neck T. score -1.4 09/29/2011   09/2013 T-1.6  . Papanicolaou smear 12/12   last abnormal 2011  . Scaphoid fracture of wrist 2013   left  . Seasonal allergic rhinitis   . Shingles 09/2015   R hip  . Wears glasses    Assessment: 33 yof presented to the ED with RLE pain and swelling. Found to have a DVT and PE. To start IV heparin. Baseline H/H is slightly low but platelets are WNL. She is not on anticoagulation PTA.   Heparin level at low end of goal this morning at 0.3. CBC stable, plan for thrombolysis this am. Will increase heparin given that she is at low end of goal.  Goal of Therapy:  Heparin level 0.3-0.7  units/ml Monitor platelets by anticoagulation protocol: Yes   Plan:  Increase Heparin infusion to 1800 units/hr Daily heparin level and CBC F/u plans for long-term AC when stable post thrombolysis  Erin Hearing PharmD., BCPS Clinical Pharmacist 11/29/2017 10:23 AM

## 2017-11-29 NOTE — Progress Notes (Signed)
[place] for heparin Indication: pulmonary embolus and DVT  Allergies  Allergen Reactions  . Ibuprofen Other (See Comments)    Avoids due to Crohns disease  . Morphine And Related Itching    itchy  . Prednisone Other (See Comments)    "crazy", hallucinations  . Adhesive [Tape] Rash    Rash with electrodes    Patient Measurements: Height: 5' 4"  (162.6 cm) Weight: 220 lb (99.8 kg) IBW/kg (Calculated) : 54.7 Heparin Dosing Weight: 77.8kg  Vital Signs: BP: 115/73 (01/29 1930) Pulse Rate: 116 (01/29 1930)  Labs: Recent Labs    11/27/17 1347 11/27/17 1841  11/28/17 0806 11/29/17 0241 11/29/17 1420 11/29/17 1921  HGB 11.4*  --   --  10.5* 9.7* 9.1* 9.7*  HCT 34.9*  --   --  32.7* 30.2* 27.2* 29.6*  PLT 281  --   --  282 279 263 266  LABPROT  --  15.2  --   --  14.1  --   --   INR  --  1.21  --   --  1.09  --   --   HEPARINUNFRC  --   --    < > 0.47 0.30 1.68* 0.42  CREATININE 0.84  --   --  0.90 0.90  --   --    < > = values in this interval not displayed.    Estimated Creatinine Clearance: 96.3 mL/min (by C-G formula based on SCr of 0.9 mg/dL). : Assessment: 47 yof presented to the ED with RLE pain and swelling. Found to have a DVT and PE. To start IV heparin. Baseline H/H is slightly low but platelets are WNL. She is not on anticoagulation PTA.   Pt on thrombolysis Hep lvl within range 0.42  Goal of Therapy:  Heparin level 0.2 - 0.5 units/ml Monitor platelets by anticoagulation protocol: Yes   Plan:  Continue hep 1800 units/hr Next lvl 0200 (q6 while on lytics) Monitor for bleeding  Levester Fresh, PharmD, BCPS, BCCCP Clinical Pharmacist Clinical phone for 11/29/2017 from 1430 [phone]: [phone] If after 2300, please call main pharmacy at: [phone] 11/29/2017 8:18 PM

## 2017-11-29 NOTE — Op Note (Signed)
    Patient name: Lindsay Mccarthy MRN: [id number] DOB: 06/11/1976 Sex: female  11/29/2017 Pre-operative Diagnosis: right leg DVT Post-operative diagnosis:  Same Surgeon:  Annamarie Major Procedure Performed:  1.  Ultrasound-guided access, right small saphenous vein  2.  Intravascular ultrasound (IVUS), right popliteal, femoral, common femoral, external iliac, common iliac vein, and inferior vena cava  3.  Placement of lysis catheter for TPA infusion within the femoral-popliteal, iliac, and inferior vena cava conscious sedation (29 minutes)    Indications: The patient has a history of Crohn's disease.  She presented to the hospital with right leg swelling.  She was found to have an extensive DVT.  She comes in today for possible intervention. Conscious sedation was used Procedure:  The patient was identified in the holding area and taken to room 8.  The patient was then placed supine on the table and prepped and draped in the usual sterile fashion.  A time out was called.  Conscious sedation was administered with the use of IV fentanyl and Versed under continuous physician and nurse monitoring.  Heart rate, blood pressure, and oxygen saturations were continuously monitored.  Ultrasound was used to evaluate the right small saphenous vein.  It was patent .  A digital ultrasound image was acquired.  A micropuncture needle was used to access the right small saphenous under ultrasound guidance.  An 018 wire was advanced without resistance and a micropuncture sheath was placed.  The 018 wire was removed and a benson wire was placed.  The micropuncture sheath was exchanged for a 8 french sheath.  I then used a 035 Bentson wire and a Berenstein 2 catheter to navigate the wire and catheter through the heart into the left subclavian vein.  A intravascular ultrasound catheter was then placed.  Findings:   Intravascular ultrasound was used to scan the femoral and popliteal veins which had occlusive thrombus.   There is also occlusive thrombus within the common femoral vein.  This extended up into the right common and external iliac veins.  There is also thrombus which was not occlusive that extended up to the level of the left renal vein.  Intervention: Because the thrombus extended into the inferior vena cava above the renal veins, I did not feel that mechanical thrombectomy was where we should start today.  Therefore I elected to place a 50 cm UniFuse catheter for overnight administration of TPA.  Impression:  #1 extensive thrombus which is occlusive in the right femoral-popliteal veins.  The thrombus extends into the iliac veins as well as up into the inferior vena cava.  #2 successful placement of TPA catheter for overnight infusion    V. Annamarie Major, M.D. [facility] [place] Office: [phone] Pager:  [phone]

## 2017-11-29 NOTE — Interval H&P Note (Signed)
History and Physical Interval Note:  11/29/2017 10:49 AM  Lindsay Mccarthy  has presented today for surgery, with the diagnosis of DVT  The various methods of treatment have been discussed with the patient and family. After consideration of risks, benefits and other options for treatment, the patient has consented to  Procedure(s): PERIPHERAL VASCULAR THROMBECTOMY - THROMBOLYSIS (N/A) as a surgical intervention .  The patient's history has been reviewed, patient examined, no change in status, stable for surgery.  I have reviewed the patient's chart and labs.  Questions were answered to the patient's satisfaction.     Annamarie Major

## 2017-11-29 NOTE — Care Management Note (Signed)
Case Management Note  Patient Details  Name: Lindsay Mccarthy MRN: [id number] Date of Birth: October 20, 1976  Subjective/Objective:       Pt admitted with DVT and PE             Action/Plan:   Pt was independent from home.  CM will continue to follow for discharge needs   Expected Discharge Date:                  Expected Discharge Plan:  Home/Self Care  In-House Referral:     Discharge planning Services  CM Consult  Post Acute Care Choice:    Choice offered to:     DME Arranged:    DME Agency:     HH Arranged:    HH Agency:     Status of Service:     If discussed at [employer] of Stay Meetings, dates discussed:    Additional Comments:  Maryclare Labrador, RN 11/29/2017, 3:55 PM

## 2017-11-30 ENCOUNTER — Encounter (HOSPITAL_COMMUNITY)
Admission: EM | Disposition: A | Discharge status: Home / Self Care | Payer: Self-pay | Source: Home / Self Care | Attending: Surgery

## 2017-11-30 DIAGNOSIS — I82419 Acute embolism and thrombosis of unspecified femoral vein: Secondary | ICD-10-CM | POA: Diagnosis present

## 2017-11-30 DIAGNOSIS — I2699 Other pulmonary embolism without acute cor pulmonale: Secondary | ICD-10-CM

## 2017-11-30 HISTORY — DX: Other pulmonary embolism without acute cor pulmonale: I26.99

## 2017-11-30 HISTORY — PX: PERIPHERAL VASCULAR BALLOON ANGIOPLASTY: CATH118281

## 2017-11-30 HISTORY — PX: PERIPHERAL VASCULAR THROMBECTOMY: CATH118306

## 2017-11-30 LAB — BASIC METABOLIC PANEL
Anion gap: 9 (ref 5–15)
CHLORIDE: 104 mmol/L (ref 101–111)
CO2: 22 mmol/L (ref 22–32)
Calcium: 7.3 mg/dL — ABNORMAL LOW (ref 8.9–10.3)
Creatinine, Ser: 0.94 mg/dL (ref 0.44–1.00)
GFR calc Af Amer: >60 mL/min (ref >60)
Glucose, Bld: 110 mg/dL — ABNORMAL HIGH (ref 65–99)
POTASSIUM: 3.6 mmol/L (ref 3.5–5.1)
SODIUM: 135 mmol/L (ref 135–145)

## 2017-11-30 LAB — FIBRINOGEN
FIBRINOGEN: 442 mg/dL (ref 210–475)
Fibrinogen: 447 mg/dL (ref 210–475)

## 2017-11-30 LAB — CBC
HEMATOCRIT: 29.4 % — AB (ref 36.0–46.0)
HEMATOCRIT: 29.1 % — AB (ref 36.0–46.0)
HEMOGLOBIN: 9.4 g/dL — AB (ref 12.0–15.0)
HEMOGLOBIN: 9.4 g/dL — AB (ref 12.0–15.0)
MCH: 30.1 pg (ref 26.0–34.0)
MCH: 30.2 pg (ref 26.0–34.0)
MCHC: 32 g/dL (ref 30.0–36.0)
MCHC: 32.3 g/dL (ref 30.0–36.0)
MCV: 94.2 fL (ref 78.0–100.0)
MCV: 93.6 fL (ref 78.0–100.0)
Platelets: 219 10*3/uL (ref 150–400)
Platelets: 226 10*3/uL (ref 150–400)
RBC: 3.12 MIL/uL — AB (ref 3.87–5.11)
RBC: 3.11 MIL/uL — AB (ref 3.87–5.11)
RDW: 15.4 % (ref 11.5–15.5)
RDW: 15.6 % — AB (ref 11.5–15.5)
WBC: 8.6 10*3/uL (ref 4.0–10.5)
WBC: 8.1 10*3/uL (ref 4.0–10.5)

## 2017-11-30 LAB — HEPARIN LEVEL (UNFRACTIONATED)
HEPARIN UNFRACTIONATED: 0.32 [IU]/mL (ref 0.30–0.70)
HEPARIN UNFRACTIONATED: 0.27 [IU]/mL — AB (ref 0.30–0.70)

## 2017-11-30 SURGERY — PERIPHERAL VASCULAR THROMBECTOMY
Anesthesia: LOCAL | Laterality: Right

## 2017-11-30 MED ORDER — ACETAMINOPHEN 325 MG PO TABS
650.0000 mg | ORAL_TABLET | ORAL | Status: DC | PRN
Start: 2017-11-30 — End: 2017-12-01

## 2017-11-30 MED ORDER — SODIUM CHLORIDE 0.9 % IV SOLN: INTRAVENOUS | Status: AC

## 2017-11-30 MED ORDER — FENTANYL CITRATE (PF) 100 MCG/2ML IJ SOLN
INTRAMUSCULAR | Status: DC | PRN
  Administered 2017-11-30: 25 ug via INTRAVENOUS
  Administered 2017-11-30: 50 ug via INTRAVENOUS

## 2017-11-30 MED ORDER — SODIUM CHLORIDE 0.9% FLUSH: 3.0000 mL | INTRAVENOUS | Status: DC | PRN

## 2017-11-30 MED ORDER — LIDOCAINE HCL 1 % IJ SOLN
INTRAMUSCULAR | Status: AC
  Filled 2017-11-30: qty 20

## 2017-11-30 MED ORDER — IODIXANOL 320 MG/ML IV SOLN
INTRAVENOUS | Status: DC | PRN
  Administered 2017-11-30: 10 mL via INTRAVENOUS

## 2017-11-30 MED ORDER — LABETALOL HCL 5 MG/ML IV SOLN: 10.0000 mg | INTRAVENOUS | Status: DC | PRN

## 2017-11-30 MED ORDER — HEPARIN (PORCINE) IN NACL 2-0.9 UNIT/ML-% IJ SOLN
INTRAMUSCULAR | Status: AC | PRN
  Administered 2017-11-30: 500 mL

## 2017-11-30 MED ORDER — SODIUM CHLORIDE 0.9 % IV SOLN
250.0000 mL | INTRAVENOUS | Status: DC | PRN
  Administered 2017-12-03: 250 mL via INTRAVENOUS

## 2017-11-30 MED ORDER — HYDRALAZINE HCL 20 MG/ML IJ SOLN: 5.0000 mg | INTRAMUSCULAR | Status: DC | PRN

## 2017-11-30 MED ORDER — MIDAZOLAM HCL 2 MG/2ML IJ SOLN
INTRAMUSCULAR | Status: DC | PRN
  Administered 2017-11-30: 2 mg via INTRAVENOUS

## 2017-11-30 MED ORDER — MIDAZOLAM HCL 2 MG/2ML IJ SOLN
INTRAMUSCULAR | Status: AC
  Filled 2017-11-30: qty 2

## 2017-11-30 MED ORDER — AZATHIOPRINE 50 MG PO TABS
200.0000 mg | ORAL_TABLET | Freq: Once | ORAL | Status: AC
  Administered 2017-11-30: 200 mg via ORAL
  Filled 2017-11-30: qty 4

## 2017-11-30 MED ORDER — FENTANYL CITRATE (PF) 100 MCG/2ML IJ SOLN
INTRAMUSCULAR | Status: AC
  Filled 2017-11-30: qty 2

## 2017-11-30 MED ORDER — SODIUM CHLORIDE 0.9 % IV SOLN
INTRAVENOUS | Status: DC | PRN
  Administered 2017-11-30: 09:00:00 via INTRAVENOUS

## 2017-11-30 MED ORDER — SODIUM CHLORIDE 0.9 % IV SOLN
INTRAVENOUS | Status: DC
  Administered 2017-11-30 – 2017-12-01 (×2): via INTRAVENOUS

## 2017-11-30 MED ORDER — ESCITALOPRAM OXALATE 10 MG PO TABS
10.0000 mg | ORAL_TABLET | Freq: Once | ORAL | Status: AC
  Administered 2017-11-30: 10 mg via ORAL

## 2017-11-30 MED ORDER — ONDANSETRON HCL 4 MG/2ML IJ SOLN: 4.0000 mg | Freq: Four times a day (QID) | INTRAMUSCULAR | Status: DC | PRN

## 2017-11-30 MED ORDER — HEPARIN (PORCINE) IN NACL 2-0.9 UNIT/ML-% IJ SOLN
INTRAMUSCULAR | Status: AC
  Filled 2017-11-30: qty 500

## 2017-11-30 MED ORDER — SODIUM CHLORIDE 0.9% FLUSH
3.0000 mL | Freq: Two times a day (BID) | INTRAVENOUS | Status: DC
  Administered 2017-12-03 – 2017-12-05 (×3): 3 mL via INTRAVENOUS

## 2017-11-30 MED FILL — Fentanyl Citrate Preservative Free (PF) Inj 100 MCG/2ML: INTRAMUSCULAR | Qty: 2 | Status: AC

## 2017-11-30 MED FILL — Lidocaine HCl Local Inj 1%: INTRAMUSCULAR | Qty: 20 | Status: AC

## 2017-11-30 SURGICAL SUPPLY — 11 items
BALLN MUSTANG 12X60X75 (BALLOONS) ×3
BALLN MUSTANG 8X80X75 (BALLOONS) ×3
BALLOON MUSTANG 12X60X75 (BALLOONS) ×2 IMPLANT
BALLOON MUSTANG 8X80X75 (BALLOONS) ×2 IMPLANT
CATH VISIONS PV .035 IVUS (CATHETERS) ×3 IMPLANT
KIT ENCORE 26 ADVANTAGE (KITS) ×3 IMPLANT
KIT PV (KITS) ×3 IMPLANT
SET ZELANTE DVT THROMB (CATHETERS) ×3 IMPLANT
SHEATH PINNACLE 8F 10CM (SHEATH) ×3 IMPLANT
TRAY PV CATH (CUSTOM PROCEDURE TRAY) ×3 IMPLANT
WIRE AMPLATZ SS-J .035X260CM (WIRE) ×3 IMPLANT

## 2017-11-30 NOTE — Progress Notes (Signed)
[place] for heparin Indication: pulmonary embolus and DVT  Allergies  Allergen Reactions  . Ibuprofen Other (See Comments)    Avoids due to Crohns disease  . Morphine And Related Itching    itchy  . Prednisone Other (See Comments)    "crazy", hallucinations  . Adhesive [Tape] Rash    Rash with electrodes    Patient Measurements: Height: 5' 4"  (162.6 cm) Weight: 220 lb (99.8 kg) IBW/kg (Calculated) : 54.7 Heparin Dosing Weight: 77.8kg  Vital Signs: Temp: 97.6 F (36.4 C) (01/30 1352) Temp Source: Oral (01/30 1352) BP: 129/81 (01/30 1352) Pulse Rate: 125 (01/30 1352)  Labs: Recent Labs    11/27/17 1841  11/28/17 0806 11/29/17 0241  11/29/17 1921 11/30/17 0215 11/30/17 0732  HGB  --    < > 10.5* 9.7*   < > 9.7* 9.4* 9.4*  HCT  --    < > 32.7* 30.2*   < > 29.6* 29.4* 29.1*  PLT  --    < > 282 279   < > 266 219 226  LABPROT 15.2  --   --  14.1  --   --   --   --   INR 1.21  --   --  1.09  --   --   --   --   HEPARINUNFRC  --    < > 0.47 0.30   < > 0.42 0.32 0.27*  CREATININE  --   --  0.90 0.90  --   --  0.94  --    < > = values in this interval not displayed.    Estimated Creatinine Clearance: 92.2 mL/min (by C-G formula based on SCr of 0.94 mg/dL).   Medical History: Past Medical History:  Diagnosis Date  . Anemia    related to Crohns flares  . Anxiety   . Arthritis    knees, feet, hands, wrists, related to Crohns flare  . Asthma    Childhood  . B12 deficiency    monitored/treated by Dr. Carlean Purl  . Cervical dysplasia   . Crohn's disease of small intestine ([place]) 06/24/2012   Diagnosed 1999, in [place]. Ileitis only then. Treated with Imuran Remicade and prednisone.  Noncompliant with therapy. 2004 return to care and was treated with Remicade prednisone Pentasa Cipro and Flagyl. 2006 status post right hemicolectomy. Subsequently treated with azathioprine and Cimzia. 200 mg every other week.  azathioprine was added  in 2010. Prometheus TP MT enzyme was negative. Has ha  . Depression   . Eczema    arms and behind knees, worse in winter  . History of recurrent UTI (urinary tract infection)   . HPV (human papilloma virus) infection   . Hyperlipemia   . Osteopenia of femur neck T. score -1.4 09/29/2011   09/2013 T-1.6  . Papanicolaou smear 12/12   last abnormal 2011  . Scaphoid fracture of wrist 2013   left  . Seasonal allergic rhinitis   . Shingles 09/2015   R hip  . Wears glasses    Assessment: 54 yof presented to the ED with RLE pain and swelling. Found to have a DVT and PE. To start IV heparin. Baseline H/H is slightly low but platelets are WNL. She is not on anticoagulation PTA.   Heparin level at low end of goal this morning at 0.27 this morning while they were still on tpa. CBC stable overnight, went to OR today for assessment and eventual thrombolysis/thromboectomy. Mild improvement with residual  thrombus, may take back in the morning for re-exploration.  Given that patient is at low end of goal and tpa now off per nurse will increase heparin up to 1900/hr to keep closer to 0.3-0.5 range.  Goal of Therapy:  Heparin level 0.3-0.5 units/ml Monitor platelets by anticoagulation protocol: Yes   Plan:  Increase Heparin infusion to 1900 units/hr Daily heparin level and CBC F/u plans for long-term AC when stable post thrombolysis  Erin Hearing PharmD., BCPS Clinical Pharmacist 11/30/2017 2:57 PM

## 2017-11-30 NOTE — Op Note (Signed)
    Patient name: Lindsay Mccarthy MRN: [id number] DOB: Mar 16, 1976 Sex: female  11/30/2017 Pre-operative Diagnosis: Right leg DVT Post-operative diagnosis:  Same Surgeon:  Annamarie Major Procedure Performed:  1.  Intravascular ultrasound (IVUS) right popliteal, femoral, common femoral, external iliac, common iliac vein, and inferior vena cava  2.  Mechanical thrombectomy of the right popliteal, femoral, common femoral, external iliac, and common iliac vein  3.  Intravenous catheter directed infusion of TPA  4.  Venoplasty of the right femoral, common femoral, external iliac, and common iliac vein as well as the inferior vena cava  5.  Right leg and inferior venacavogram  6.  Conscious sedation (greater than 15 minutes)   Indications: Patient is here today for follow-up thrombolyzes study.  She had catheters placed yesterday for overnight infusion  Procedure:  The patient was identified in the holding area and taken to room 8.  The patient was then placed supine on the table and prepped and draped in the usual sterile fashion.  A time out was called.  Conscious sedation was administered with the use of IV fentanyl and Versed under continuous physician and nurse monitoring.  Heart rate, blood pressure, and oxygen saturation were continuously monitored.  The patient's heparin drip was continued.   Intravascular ultrasound was used to evaluate the right popliteal, femoral, common femoral, external iliac, and common iliac vein as well as the inferior vena cava.  There was mild improvement in the thrombus burden.  Intervention: At this point I elected to proceed with AngioJet thrombolysis and mechanical thrombectomy.  The device was run for a total of 240 seconds throughout the right popliteal, femoral, common femoral, external iliac, common iliac veins.  Once the device was complete I reinserted the intravascular ultrasound catheter.  There was again mild to moderate improvement in the thrombus  burden.  I then selected a 8 x 80 Mustang balloon and performed balloon angioplasty of the femoral, common femoral, external iliac, common iliac, and inferior vena cava.  Intravascular ultrasound was then reinserted and I was not happy with these results and therefore I upsized to a 12 x 60 Mustang balloon and performed balloon angioplasty of the right common femoral, external iliac, common iliac vein, and inferior vena cava.  The ultrasound machine was then reinserted and there appeared to be a flow channel throughout the venous system.  I then performed left leg and inferior vena cava a sending venography.  This showed sluggish progression of contrast through the venous system however appear to patent.  At this point I do not feel that proceeding with continuing thrombolysis would be beneficial given the overnight results.  I did not feel clinically he is in the AngioJet for any further time.  Elected to terminate the procedure at this time.  The sheath and wires were removed and manual pressure was held for hemostasis.  Patient be taken to recovery room in stable condition  Impression:  #1  Mild improvement in thrombus burden after overnight TPA infusion  #2  Mild improvement after AngioJet thrombectomy and balloon venoplasty  #3  The patient has residual thrombus within her inferior vena cava.  #4  We will evaluate her clinically over the next several days and consider AngioVac utilization to address the ileo-caval thrombus   V. Annamarie Major, M.D. [facility] [place] Office: [phone] Pager:  [phone]

## 2017-11-30 NOTE — Progress Notes (Addendum)
[facility] [place]  Subjective  - Doing well over all, no new complaints.   Objective 113/71 93 99.2 F (37.3 C) (Oral) 14 95%  Intake/Output Summary (Last 24 hours) at 11/30/2017 0751 Last data filed at 11/30/2017 0600 Gross per 24 hour  Intake 6711.33 ml  Output 1450 ml  Net 5261.33 ml    Edema right LE > left Palpable DP pulses B, active range of motion intact with out ischemic changes. TPA catheter right lateral lower leg. Lungs non labored breathing Heart RRR  Assessment/Planning: DVT/PE POD # 1 placement of TPA catheter for overnight infusion secondary to extensive thrombus which is occlusive in the right femoral-popliteal veins.  The thrombus extends into the iliac veins as well as up into the inferior vena cava. HGB 9.4 hemodynamically stable Heparin IV with TPA   I agree with the above.  Her right leg feels a little better today.  Discussed proceeding with follow up lysis study today.  Wells Cristi Loron 11/30/2017 7:51 AM --  Laboratory Lab Results: Recent Labs    11/29/17 1921 11/30/17 0215  WBC 10.6* 8.6  HGB 9.7* 9.4*  HCT 29.6* 29.4*  PLT 266 219   BMET Recent Labs    11/29/17 0241 11/30/17 0215  NA 135 135  K 3.5 3.6  CL 103 104  CO2 24 22  GLUCOSE 96 110*  BUN 6 <5*  CREATININE 0.90 0.94  CALCIUM 7.7* 7.3*    COAG Lab Results  Component Value Date   INR 1.09 11/29/2017   INR 1.21 11/27/2017   INR 1.0 10/14/2016   No results found for: PTT

## 2017-11-30 NOTE — Progress Notes (Signed)
[facility] [place]  Subjective  - feels a little better   Objective 126/77 (!) 126 97.6 F (36.4 C) (Oral) (!) 24 94%  Intake/Output Summary (Last 24 hours) at 11/30/2017 1632 Last data filed at 11/30/2017 0800 Gross per 24 hour  Intake 7267.33 ml  Output 1900 ml  Net 5367.33 ml   Right leg still swollen  Assessment/Planning: Extensive right leg DVT Still on heparin Hopefully we have decreased her clot burden enough to recover.  Dr Trula Slade to see tomorrow to consider whether or not she needs angiovac  Lindsay Mccarthy 11/30/2017 4:32 PM --  Laboratory Lab Results: Recent Labs    11/30/17 0215 11/30/17 0732  WBC 8.6 8.1  HGB 9.4* 9.4*  HCT 29.4* 29.1*  PLT 219 226   BMET Recent Labs    11/29/17 0241 11/30/17 0215  NA 135 135  K 3.5 3.6  CL 103 104  CO2 24 22  GLUCOSE 96 110*  BUN 6 <5*  CREATININE 0.90 0.94  CALCIUM 7.7* 7.3*    COAG Lab Results  Component Value Date   INR 1.09 11/29/2017   INR 1.21 11/27/2017   INR 1.0 10/14/2016   No results found for: PTT

## 2017-11-30 NOTE — Progress Notes (Signed)
ANTICOAGULATION CONSULT NOTE - Follow Up Consult  Pharmacy Consult for heparin Indication: PE/DVT  Labs: Recent Labs    11/27/17 1841  11/28/17 0806 11/29/17 0241 11/29/17 1420 11/29/17 1921 11/30/17 0215  HGB  --   --  10.5* 9.7* 9.1* 9.7* 9.4*  HCT  --   --  32.7* 30.2* 27.2* 29.6* 29.4*  PLT  --   --  282 279 263 266 219  LABPROT 15.2  --   --  14.1  --   --   --   INR 1.21  --   --  1.09  --   --   --   HEPARINUNFRC  --    < > 0.47 0.30 1.68* 0.42 0.32  CREATININE  --   --  0.90 0.90  --   --  0.94   < > = values in this interval not displayed.    Assessment/Plan:  42yo female remains therapeutic on heparin though trending down. Will continue gtt at current rate and confirm stable with additional level.   Wynona Neat, PharmD, BCPS  11/30/2017,3:41 AM

## 2017-12-01 ENCOUNTER — Other Ambulatory Visit (HOSPITAL_COMMUNITY): Payer: 59

## 2017-12-01 ENCOUNTER — Encounter (HOSPITAL_COMMUNITY): Payer: Self-pay | Admitting: Surgery

## 2017-12-01 LAB — BASIC METABOLIC PANEL
Anion gap: 11 (ref 5–15)
BUN: 6 mg/dL (ref 6–20)
CALCIUM: 7.5 mg/dL — AB (ref 8.9–10.3)
CO2: 19 mmol/L — ABNORMAL LOW (ref 22–32)
CREATININE: 0.93 mg/dL (ref 0.44–1.00)
Chloride: 109 mmol/L (ref 101–111)
GFR calc non Af Amer: >60 mL/min (ref >60)
Glucose, Bld: 109 mg/dL — ABNORMAL HIGH (ref 65–99)
Potassium: 3.3 mmol/L — ABNORMAL LOW (ref 3.5–5.1)
SODIUM: 139 mmol/L (ref 135–145)

## 2017-12-01 LAB — CBC
HCT: 28.8 % — ABNORMAL LOW (ref 36.0–46.0)
Hemoglobin: 9.4 g/dL — ABNORMAL LOW (ref 12.0–15.0)
MCH: 30.5 pg (ref 26.0–34.0)
MCHC: 32.6 g/dL (ref 30.0–36.0)
MCV: 93.5 fL (ref 78.0–100.0)
PLATELETS: 209 10*3/uL (ref 150–400)
RBC: 3.08 MIL/uL — ABNORMAL LOW (ref 3.87–5.11)
RDW: 15.6 % — AB (ref 11.5–15.5)
WBC: 7.4 10*3/uL (ref 4.0–10.5)

## 2017-12-01 LAB — HEPARIN LEVEL (UNFRACTIONATED): Heparin Unfractionated: 0.33 IU/mL (ref 0.30–0.70)

## 2017-12-01 NOTE — Progress Notes (Addendum)
  Progress Note    12/01/2017 7:39 AM 1 Day Post-Op  Subjective:  Patient states pain in RLE has almost completely resolved and feels her leg feels less "tight"   Vitals:   11/30/17 2019 12/01/17 0330  BP: 112/77 117/79  Pulse: (!) 118 (!) 114  Resp: 17 (!) 22  Temp: 98.4 F (36.9 C) 97.7 F (36.5 C)  SpO2: 99% 95%   Physical Exam: Lungs:  Non labored Incisions:  Unremarkable catheterization site Extremities:  Feet symmetrically warm to touch; edema RLE compared to L Abdomen:  Soft Neurologic: A&O  CBC    Component Value Date/Time   WBC 7.4 12/01/2017 0427   RBC 3.08 (L) 12/01/2017 0427   HGB 9.4 (L) 12/01/2017 0427   HCT 28.8 (L) 12/01/2017 0427   PLT 209 12/01/2017 0427   MCV 93.5 12/01/2017 0427   MCH 30.5 12/01/2017 0427   MCHC 32.6 12/01/2017 0427   RDW 15.6 (H) 12/01/2017 0427   LYMPHSABS 1.6 10/21/2017 1551   MONOABS 0.8 10/21/2017 1551   EOSABS 0.2 10/21/2017 1551   BASOSABS 0.0 10/21/2017 1551    BMET    Component Value Date/Time   NA 135 11/30/2017 0215   K 3.6 11/30/2017 0215   CL 104 11/30/2017 0215   CO2 22 11/30/2017 0215   GLUCOSE 110 (H) 11/30/2017 0215   BUN <5 (L) 11/30/2017 0215   CREATININE 0.94 11/30/2017 0215   CREATININE 0.81 12/15/2015 0001   CALCIUM 7.3 (L) 11/30/2017 0215   GFRNONAA >60 11/30/2017 0215   GFRAA >60 11/30/2017 0215    INR    Component Value Date/Time   INR 1.09 11/29/2017 0241     Intake/Output Summary (Last 24 hours) at 12/01/2017 0739 Last data filed at 12/01/2017 0224 Gross per 24 hour  Intake 1580.62 ml  Output 450 ml  Net 1130.62 ml     Assessment/Plan:  42 y.o. female is s/p completion lysis study with thrombectomy R popliteal, femoral, CFV, EI vein, and CI vein 1 Day Post-Op   Symptoms RLE improved post operatively Continue IV heparin Continue elevation and compression with ACE wrap RLE Dr. Trula Slade considering ileo-caval angiovac if symptoms of pain and edema persist   Dagoberto Ligas,  PA-C [facility] [phone] 12/01/2017 7:39 AM  I agree with the above.  I have seen and examined the patient.  PE:  She has had some tachycardia overnight with the addition of nasal canula O2.  She potentially could have had additional PE's during her procedure.  I will get an echo to evaluate for right heart strain.  DVT;  Currently she feels better.  I will let her ambulate to see how her legs feel.  I would like to get a CTA to evaluate her lungs and the IVC.  I will delay this given angiojet yesterday.  Creatinine pending for today  Continue IV heparin with likely transition to NOAC prior to discharge  Wells Nevayah Faust

## 2017-12-01 NOTE — Progress Notes (Signed)
Lindsay Mccarthy for heparin Indication: pulmonary embolus and DVT  Allergies  Allergen Reactions  . Ibuprofen Other (See Comments)    Avoids due to Crohns disease  . Morphine And Related Itching    itchy  . Prednisone Other (See Comments)    "crazy", hallucinations  . Adhesive [Tape] Rash    Rash with electrodes    Patient Measurements: Height: 5' 4"  (162.6 cm) Weight: 220 lb (99.8 kg) IBW/kg (Calculated) : 54.7 Heparin Dosing Weight: 77.8kg  Vital Signs: Temp: 97.7 F (36.5 C) (01/31 0330) Temp Source: Oral (01/31 0330) BP: 117/79 (01/31 0330) Pulse Rate: 114 (01/31 0330)  Labs: Recent Labs    11/29/17 0241  11/30/17 0215 11/30/17 0732 12/01/17 0427  HGB 9.7*   < > 9.4* 9.4* 9.4*  HCT 30.2*   < > 29.4* 29.1* 28.8*  PLT 279   < > 219 226 209  LABPROT 14.1  --   --   --   --   INR 1.09  --   --   --   --   HEPARINUNFRC 0.30   < > 0.32 0.27* 0.33  CREATININE 0.90  --  0.94  --   --    < > = values in this interval not displayed.    Estimated Creatinine Clearance: 92.2 mL/min (by C-G formula based on SCr of 0.94 mg/dL).   Medical History: Past Medical History:  Diagnosis Date  . Anemia    related to Crohns flares  . Anxiety   . Arthritis    knees, feet, hands, wrists, related to Crohns flare  . Asthma    Childhood  . B12 deficiency    monitored/treated by Dr. Carlean Purl  . Cervical dysplasia   . Crohn's disease of small intestine ([place]) 06/24/2012   Diagnosed 1999, in [place]. Ileitis only then. Treated with Imuran Remicade and prednisone.  Noncompliant with therapy. 2004 return to care and was treated with Remicade prednisone Pentasa Cipro and Flagyl. 2006 status post right hemicolectomy. Subsequently treated with azathioprine and Cimzia. 200 mg every other week.  azathioprine was added in 2010. Prometheus TP MT enzyme was negative. Has ha  . Depression   . Eczema    arms and behind knees, worse in winter  . History of  recurrent UTI (urinary tract infection)   . HPV (human papilloma virus) infection   . Hyperlipemia   . Osteopenia of femur neck T. score -1.4 09/29/2011   09/2013 T-1.6  . Papanicolaou smear 12/12   last abnormal 2011  . Scaphoid fracture of wrist 2013   left  . Seasonal allergic rhinitis   . Shingles 09/2015   R hip  . Wears glasses    Assessment: 79 yof presented to the ED with RLE pain and swelling. Found to have a DVT and PE. To start IV heparin. Baseline H/H is slightly low but platelets are WNL. She is not on anticoagulation PTA.   Heparin level at low end of goal this morning at 0.27 this morning while they were still on tpa. CBC stable overnight, went to OR today for assessment and eventual thrombolysis/thromboectomy. Mild improvement with residual thrombus, may take back in the morning for re-exploration.  Given that patient is at low end of goal and tpa still off will increase heparin up to 2000/hr to keep closer to 0.3-0.5 range.  Goal of Therapy:  Heparin level 0.3-0.5 units/ml Monitor platelets by anticoagulation protocol: Yes   Plan:  Increase Heparin infusion to  2000 units/hr Daily heparin level and CBC Transition to NOAC prior to [place] PharmD., BCPS Clinical Pharmacist 12/01/2017 9:51 AM

## 2017-12-01 NOTE — Plan of Care (Signed)
  Progressing Health Behavior/Discharge Planning: Ability to manage health-related needs will improve 12/01/2017 0407 - Progressing by Peggye Pitt, RN   Completed/Met Education: Knowledge of General Education information will improve 12/01/2017 0407 - Completed/Met by Peggye Pitt, RN Clinical Measurements: Respiratory complications will improve 12/01/2017 0407 - Completed/Met by Peggye Pitt, RN

## 2017-12-02 ENCOUNTER — Encounter (HOSPITAL_COMMUNITY): Payer: Self-pay | Admitting: Radiology

## 2017-12-02 ENCOUNTER — Inpatient Hospital Stay (HOSPITAL_COMMUNITY): Payer: 59

## 2017-12-02 DIAGNOSIS — I2782 Chronic pulmonary embolism: Secondary | ICD-10-CM

## 2017-12-02 DIAGNOSIS — M545 Low back pain: Secondary | ICD-10-CM

## 2017-12-02 LAB — HEPARIN LEVEL (UNFRACTIONATED): HEPARIN UNFRACTIONATED: 0.49 [IU]/mL (ref 0.30–0.70)

## 2017-12-02 LAB — BASIC METABOLIC PANEL
Anion gap: 10 (ref 5–15)
BUN: 5 mg/dL — ABNORMAL LOW (ref 6–20)
CALCIUM: 7.5 mg/dL — AB (ref 8.9–10.3)
CO2: 21 mmol/L — ABNORMAL LOW (ref 22–32)
CREATININE: 0.87 mg/dL (ref 0.44–1.00)
Chloride: 108 mmol/L (ref 101–111)
GFR calc Af Amer: >60 mL/min (ref >60)
GLUCOSE: 93 mg/dL (ref 65–99)
Potassium: 3.2 mmol/L — ABNORMAL LOW (ref 3.5–5.1)
SODIUM: 139 mmol/L (ref 135–145)

## 2017-12-02 LAB — CBC
HCT: 25.5 % — ABNORMAL LOW (ref 36.0–46.0)
HEMOGLOBIN: 8.1 g/dL — AB (ref 12.0–15.0)
MCH: 29.6 pg (ref 26.0–34.0)
MCHC: 31.8 g/dL (ref 30.0–36.0)
MCV: 93.1 fL (ref 78.0–100.0)
PLATELETS: 183 10*3/uL (ref 150–400)
RBC: 2.74 MIL/uL — ABNORMAL LOW (ref 3.87–5.11)
RDW: 15.6 % — AB (ref 11.5–15.5)
WBC: 6.5 10*3/uL (ref 4.0–10.5)

## 2017-12-02 LAB — ECHOCARDIOGRAM COMPLETE
HEIGHTINCHES: 64 in
WEIGHTICAEL: 3520 [oz_av]

## 2017-12-02 MED ORDER — IOPAMIDOL (ISOVUE-370) INJECTION 76%
INTRAVENOUS | Status: AC
  Administered 2017-12-02: 100 mL
  Filled 2017-12-02: qty 100

## 2017-12-02 NOTE — Progress Notes (Signed)
[place] for heparin Indication: pulmonary embolus and DVT  Allergies  Allergen Reactions  . Ibuprofen Other (See Comments)    Avoids due to Crohns disease  . Morphine And Related Itching    itchy  . Prednisone Other (See Comments)    "crazy", hallucinations  . Adhesive [Tape] Rash    Rash with electrodes    Patient Measurements: Height: 5' 4"  (162.6 cm) Weight: 220 lb (99.8 kg) IBW/kg (Calculated) : 54.7 Heparin Dosing Weight: 77.8kg  Vital Signs: Temp: 97.7 F (36.5 C) (02/01 0752) Temp Source: Oral (02/01 0752) BP: 120/85 (02/01 0752) Pulse Rate: 96 (02/01 0752)  Labs: Recent Labs    11/30/17 0215 11/30/17 0732 12/01/17 0427 12/01/17 1025 12/02/17 0237 12/02/17 0244  HGB 9.4* 9.4* 9.4*  --   --  8.1*  HCT 29.4* 29.1* 28.8*  --   --  25.5*  PLT 219 226 209  --   --  183  HEPARINUNFRC 0.32 0.27* 0.33  --  0.49  --   CREATININE 0.94  --   --  0.93  --  0.87    Estimated Creatinine Clearance: 99.6 mL/min (by C-G formula based on SCr of 0.87 mg/dL).   Medical History: Past Medical History:  Diagnosis Date  . Anemia    related to Crohns flares  . Anxiety   . Arthritis    knees, feet, hands, wrists, related to Crohns flare  . Asthma    Childhood  . B12 deficiency    monitored/treated by Dr. Carlean Purl  . Cervical dysplasia   . Crohn's disease of small intestine ([place]) 06/24/2012   Diagnosed 1999, in [place]. Ileitis only then. Treated with Imuran Remicade and prednisone.  Noncompliant with therapy. 2004 return to care and was treated with Remicade prednisone Pentasa Cipro and Flagyl. 2006 status post right hemicolectomy. Subsequently treated with azathioprine and Cimzia. 200 mg every other week.  azathioprine was added in 2010. Prometheus TP MT enzyme was negative. Has ha  . Depression   . Eczema    arms and behind knees, worse in winter  . History of recurrent UTI (urinary tract infection)   . HPV (human papilloma  virus) infection   . Hyperlipemia   . Osteopenia of femur neck T. score -1.4 09/29/2011   09/2013 T-1.6  . Papanicolaou smear 12/12   last abnormal 2011  . Scaphoid fracture of wrist 2013   left  . Seasonal allergic rhinitis   . Shingles 09/2015   R hip  . Wears glasses    Assessment: 65 yof presented to the ED with RLE pain and swelling. Found to have a DVT and PE. To start IV heparin. Baseline H/H is slightly low but platelets are WNL. She was not on anticoagulation PTA.   Heparin level at goal on 2000 units/hr this morning. No bleeding issues noted. I have asked case management to go ahead and look into copay information for DOACs. Hgb down this am, to 8.1.  Goal of Therapy:  Heparin level 0.3-0.5 units/ml Monitor platelets by anticoagulation protocol: Yes   Plan:  Continue Heparin infusion at 2000 units/hr Daily heparin level and CBC Transition to [place] prior to [place] PharmD., BCPS Clinical Pharmacist 12/02/2017 9:16 AM

## 2017-12-02 NOTE — Progress Notes (Signed)
Per Insurance check for [place]  # 3. S/W JC @ [employer] # [phone]   1. ELIQUIS  5 MG BID  COVER- YES  CO-PAY- $ 84.00  TIER- 3 DRUG  PRIOR APPROVAL- NO   2. XARELTO  20 MG DAILY  COVER- YES  CO-PAY-$ 40.00  TIER- 2 DRUG  PRIOR APPROVAL- NO   DEDUCTIBLE: NOT MET   PREFERRED PHARMACY : [facility]  

## 2017-12-02 NOTE — Progress Notes (Signed)
  Echocardiogram 2D Echocardiogram has been performed.  Lindsay Mccarthy 12/02/2017, 2:13 PM

## 2017-12-02 NOTE — Progress Notes (Addendum)
    Subjective  -   C/o mid back pain with laying flat which goes away with movement Still with SOB Leg feels better  Physical Exam:  On O2 Abd soft Leg wrapped       Assessment/Plan:    CT scan today Echo today Possible d/c if able to come off of o2 and not worrisome findings on CT scan or echo  ADDENDUM: I discussed the CT scan findings with the patient.  She has a significant right pulmonary embolus.  I discussed with interventional radiology the possibility of thrombolysis given her tachycardia and oxygen requirement.  She is currently getting an echocardiogram.  She understands that we will not discharge her home today.  Hopefully an attempt at thrombolysis can be performed.  Her vena cava does not appear to have any thrombus so I do not think any additional procedures need to be performed regarding her DVT.  Wells Brabham 12/02/2017 9:50 AM --  Vitals:   12/02/17 0515 12/02/17 0752  BP: 103/73 120/85  Pulse: 93 96  Resp: 17 (!) 22  Temp: 98.4 F (36.9 C) 97.7 F (36.5 C)  SpO2: 97% 99%    Intake/Output Summary (Last 24 hours) at 12/02/2017 0950 Last data filed at 12/01/2017 2200 Gross per 24 hour  Intake 720 ml  Output -  Net 720 ml     Laboratory CBC    Component Value Date/Time   WBC 6.5 12/02/2017 0244   HGB 8.1 (L) 12/02/2017 0244   HCT 25.5 (L) 12/02/2017 0244   PLT 183 12/02/2017 0244    BMET    Component Value Date/Time   NA 139 12/02/2017 0244   K 3.2 (L) 12/02/2017 0244   CL 108 12/02/2017 0244   CO2 21 (L) 12/02/2017 0244   GLUCOSE 93 12/02/2017 0244   BUN 5 (L) 12/02/2017 0244   CREATININE 0.87 12/02/2017 0244   CREATININE 0.81 12/15/2015 0001   CALCIUM 7.5 (L) 12/02/2017 0244   GFRNONAA >60 12/02/2017 0244   GFRAA >60 12/02/2017 0244    COAG Lab Results  Component Value Date   INR 1.09 11/29/2017   INR 1.21 11/27/2017   INR 1.0 10/14/2016   No results found for: PTT  Antibiotics Anti-infectives (From admission,  onward)   None       V. Leia Alf, M.D. [facility] Office: [phone] Pager:  [phone]

## 2017-12-03 ENCOUNTER — Encounter (HOSPITAL_COMMUNITY): Payer: Self-pay | Admitting: Interventional Radiology

## 2017-12-03 ENCOUNTER — Inpatient Hospital Stay (HOSPITAL_COMMUNITY): Payer: 59

## 2017-12-03 DIAGNOSIS — I82411 Acute embolism and thrombosis of right femoral vein: Principal | ICD-10-CM

## 2017-12-03 DIAGNOSIS — I2609 Other pulmonary embolism with acute cor pulmonale: Secondary | ICD-10-CM

## 2017-12-03 DIAGNOSIS — J9600 Acute respiratory failure, unspecified whether with hypoxia or hypercapnia: Secondary | ICD-10-CM

## 2017-12-03 HISTORY — PX: IR INFUSION THROMBOL ARTERIAL INITIAL (MS): IMG5376

## 2017-12-03 HISTORY — PX: IR ANGIOGRAM SELECTIVE EACH ADDITIONAL VESSEL: IMG667

## 2017-12-03 HISTORY — PX: IR US GUIDE VASC ACCESS RIGHT: IMG2390

## 2017-12-03 HISTORY — PX: IR ANGIOGRAM PULMONARY BILATERAL SELECTIVE: IMG664

## 2017-12-03 LAB — CBC
HCT: 24.2 % — ABNORMAL LOW (ref 36.0–46.0)
HEMATOCRIT: 23.7 % — AB (ref 36.0–46.0)
HEMOGLOBIN: 7.8 g/dL — AB (ref 12.0–15.0)
Hemoglobin: 7.9 g/dL — ABNORMAL LOW (ref 12.0–15.0)
MCH: 30.6 pg (ref 26.0–34.0)
MCH: 31.1 pg (ref 26.0–34.0)
MCHC: 32.6 g/dL (ref 30.0–36.0)
MCHC: 32.9 g/dL (ref 30.0–36.0)
MCV: 93.8 fL (ref 78.0–100.0)
MCV: 94.4 fL (ref 78.0–100.0)
Platelets: 260 10*3/uL (ref 150–400)
Platelets: 302 10*3/uL (ref 150–400)
RBC: 2.58 MIL/uL — ABNORMAL LOW (ref 3.87–5.11)
RBC: 2.51 MIL/uL — AB (ref 3.87–5.11)
RDW: 16 % — AB (ref 11.5–15.5)
RDW: 15.8 % — ABNORMAL HIGH (ref 11.5–15.5)
WBC: 5.9 10*3/uL (ref 4.0–10.5)
WBC: 4.8 10*3/uL (ref 4.0–10.5)

## 2017-12-03 LAB — MRSA PCR SCREENING: MRSA by PCR: NEGATIVE

## 2017-12-03 LAB — FIBRINOGEN: Fibrinogen: 566 mg/dL — ABNORMAL HIGH (ref 210–475)

## 2017-12-03 LAB — HEPARIN LEVEL (UNFRACTIONATED)
HEPARIN UNFRACTIONATED: 0.49 [IU]/mL (ref 0.30–0.70)
Heparin Unfractionated: 0.34 IU/mL (ref 0.30–0.70)

## 2017-12-03 MED ORDER — SODIUM CHLORIDE 0.9% FLUSH
3.0000 mL | INTRAVENOUS | Status: DC | PRN
  Administered 2017-12-06: 3 mL via INTRAVENOUS
  Filled 2017-12-03: qty 3

## 2017-12-03 MED ORDER — MIDAZOLAM HCL 2 MG/2ML IJ SOLN
INTRAMUSCULAR | Status: AC
  Filled 2017-12-03: qty 6

## 2017-12-03 MED ORDER — SODIUM CHLORIDE 0.9 % IV SOLN
12.0000 mg | Freq: Once | INTRAVENOUS | Status: AC
  Administered 2017-12-03: 12 mg via INTRAVENOUS
  Filled 2017-12-03: qty 12

## 2017-12-03 MED ORDER — IOPAMIDOL (ISOVUE-300) INJECTION 61%
INTRAVENOUS | Status: AC
  Administered 2017-12-03: 15 mL
  Filled 2017-12-03: qty 50

## 2017-12-03 MED ORDER — HYDROMORPHONE HCL 1 MG/ML IJ SOLN
INTRAMUSCULAR | Status: AC | PRN
  Administered 2017-12-03: 0.5 mg via INTRAVENOUS

## 2017-12-03 MED ORDER — SODIUM CHLORIDE 0.9 % IV SOLN
250.0000 mL | INTRAVENOUS | Status: DC | PRN
  Administered 2017-12-03: 250 mL via INTRAVENOUS

## 2017-12-03 MED ORDER — FENTANYL CITRATE (PF) 100 MCG/2ML IJ SOLN
INTRAMUSCULAR | Status: AC
  Filled 2017-12-03: qty 4

## 2017-12-03 MED ORDER — LIDOCAINE HCL 1 % IJ SOLN
INTRAMUSCULAR | Status: AC
  Filled 2017-12-03: qty 20

## 2017-12-03 MED ORDER — MIDAZOLAM HCL 2 MG/2ML IJ SOLN
INTRAMUSCULAR | Status: DC | PRN
  Administered 2017-12-03 (×4): 1 mg via INTRAVENOUS

## 2017-12-03 MED ORDER — SODIUM CHLORIDE 0.9 % IV SOLN
INTRAVENOUS | Status: DC
  Administered 2017-12-04: 04:00:00 via INTRAVENOUS

## 2017-12-03 MED ORDER — ORAL CARE MOUTH RINSE
15.0000 mL | Freq: Two times a day (BID) | OROMUCOSAL | Status: DC
  Administered 2017-12-03 – 2017-12-04 (×2): 15 mL via OROMUCOSAL

## 2017-12-03 MED ORDER — SODIUM CHLORIDE 0.9 % IV SOLN
INTRAVENOUS | Status: DC
  Administered 2017-12-03: 17:00:00 via INTRAVENOUS

## 2017-12-03 MED ORDER — HYDROMORPHONE HCL 1 MG/ML IJ SOLN
INTRAMUSCULAR | Status: AC
  Filled 2017-12-03: qty 1

## 2017-12-03 MED ORDER — DIAZEPAM 5 MG PO TABS
5.0000 mg | ORAL_TABLET | Freq: Four times a day (QID) | ORAL | Status: DC | PRN
  Administered 2017-12-03 – 2017-12-05 (×4): 5 mg via ORAL
  Filled 2017-12-03 (×4): qty 1

## 2017-12-03 MED ORDER — LIDOCAINE HCL (PF) 1 % IJ SOLN
INTRAMUSCULAR | Status: AC | PRN
  Administered 2017-12-03: 10 mL

## 2017-12-03 MED ORDER — SODIUM CHLORIDE 0.9% FLUSH
3.0000 mL | Freq: Two times a day (BID) | INTRAVENOUS | Status: DC
  Administered 2017-12-04 – 2017-12-05 (×4): 3 mL via INTRAVENOUS

## 2017-12-03 MED ORDER — FENTANYL CITRATE (PF) 100 MCG/2ML IJ SOLN
INTRAMUSCULAR | Status: AC | PRN
  Administered 2017-12-03 (×2): 50 ug via INTRAVENOUS

## 2017-12-03 NOTE — Progress Notes (Signed)
Subjective: Interval History: none.. Less short of breath this morning.  Oxygen saturations 100% on nasal cannula.  Heart rate 95-100  Objective: Vital signs in last 24 hours: Temp:  [97.7 F (36.5 C)-98.6 F (37 C)] 98.2 F (36.8 C) (02/02 0344) Pulse Rate:  [94-110] 110 (02/02 0900) Resp:  [14-37] 37 (02/02 0900) BP: (90-132)/(53-89) 132/69 (02/02 0809) SpO2:  [92 %-100 %] 100 % (02/02 0900)  Intake/Output from previous day: 02/01 0701 - 02/02 0700 In: 240 [P.O.:240] Out: -  Intake/Output this shift: No intake/output data recorded.  Lower extremities swelling.  No change per patient.  Lab Results: Recent Labs    12/02/17 0244 12/03/17 0623  WBC 6.5 5.9  HGB 8.1* 7.9*  HCT 25.5* 24.2*  PLT 183 260   BMET Recent Labs    12/01/17 1025 12/02/17 0244  NA 139 139  K 3.3* 3.2*  CL 109 108  CO2 19* 21*  GLUCOSE 109* 93  BUN 6 5*  CREATININE 0.93 0.87  CALCIUM 7.5* 7.5*    Studies/Results: Ct Angio Chest Pe W Or Wo Contrast  Result Date: 12/02/2017 CLINICAL DATA:  High pretest probability of pulmonary emboli. Mid back pain, shortness of breath. EXAM: CT ANGIOGRAPHY CHEST CT ABDOMEN AND PELVIS WITH CONTRAST TECHNIQUE: Multidetector CT imaging of the chest was performed using the standard protocol during bolus administration of intravenous contrast. Multiplanar CT image reconstructions and MIPs were obtained to evaluate the vascular anatomy. Multidetector CT imaging of the abdomen and pelvis was performed using the standard protocol during bolus administration of intravenous contrast. CONTRAST:  190m ISOVUE-370 IOPAMIDOL (ISOVUE-370) INJECTION 76% COMPARISON:  11/27/2017 FINDINGS: CTA CHEST FINDINGS Cardiovascular: Mild right atrial enlargement. Dilated right ventricle, RV/LV ratio 1.5. Large partially occlusive acute pulmonary embolus in the right pulmonary artery extending into the upper and lower lobe segmental branches. 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 Adequate contrast opacification of the thoracic aorta with no evidence of dissection, aneurysm, or stenosis. There is classic 3-vessel brachiocephalic arch anatomy without proximal stenosis. No significant atheromatous irregularity. Mediastinum/Nodes: No enlarged mediastinal, hilar, or axillary lymph nodes. Thyroid gland, trachea, and esophagus demonstrate no significant findings. Lungs/Pleura: Stable presumed bronchocele in the lateral basal segment left lower lobe. New interstitial infiltrate in the left upper lobe suprahilar region. Right lung clear. No pneumothorax. No pleural effusion. Musculoskeletal: No chest wall abnormality. No acute or significant osseous findings. Review of the MIP images confirms the above findings. CT ABDOMEN and PELVIS FINDINGS Hepatobiliary: No focal liver abnormality is seen. No gallstones, gallbladder wall thickening, or biliary dilatation. Pancreas: Unremarkable. No pancreatic ductal dilatation or surrounding inflammatory changes. Spleen: Normal in size without focal abnormality. Adrenals/Urinary Tract: Normal adrenals. 0.8 cm probable cyst, upper pole left kidney. 5.2 cm cyst, lower pole right kidney. No hydronephrosis. Urinary bladder incompletely distended. Stomach/Bowel: Stomach is nondistended. Small bowel decompressed. Anastomotic staple line in the terminal ileum. The colon is nondilated. Vascular/Lymphatic: No significant arterial pathology evident. Partially occlusive DVT in the right femoral, deep femoral, and common femoral veins. There is also some partially occlusive thrombus in the right iliac vein just proximal to its confluence with the cava. IVC appears widely patent. Portal vein patent. No retroperitoneal hemorrhage. Reproductive: Uterus and bilateral adnexa are unremarkable. IUD in expected location. Other: No ascites.  No  free air. Musculoskeletal: Edematous/inflammatory changes in the subcutaneous tissues of the visualized proximal right thigh.  If regional bones unremarkable. Review of the MIP images confirms the above findings. IMPRESSION: 1. Positive for central partially occlusive right and segmental left lower lobe pulmonary emboli with CT evidence of right heart strain (RV/LV Ratio = 1.5) consistent with at least submassive (intermediate risk) PE. The presence of right heart strain has been associated with an increased risk of morbidity and mortality. Critical Value/emergent results were called by telephone at the time of interpretation on 12/02/2017 at 11:52 am to Dr. Harold Barban , who verbally acknowledged these results. 2. Residual partially occlusive DVT in the right lower extremity and right iliac vein. No evidence of caval thrombus. Electronically Signed   By: Lucrezia Europe M.D.   On: 12/02/2017 11:52   Ct Angio Chest Pe W And/or Wo Contrast  Result Date: 11/27/2017 CLINICAL DATA:  High pretest probability for pulmonary embolism. One day of leg swelling on the right. History of DVT. EXAM: CT ANGIOGRAPHY CHEST WITH CONTRAST TECHNIQUE: Multidetector CT imaging of the chest was performed using the standard protocol during bolus administration of intravenous contrast. Multiplanar CT image reconstructions and MIPs were obtained to evaluate the vascular anatomy. CONTRAST:  112m ISOVUE-370 IOPAMIDOL (ISOVUE-370) INJECTION 76% COMPARISON:  None. FINDINGS: Cardiovascular: Satisfactory opacification of the pulmonary arteries. There is a linear eccentric filling defect within the left arterial tree at the bifurcation of the lower lobe and lingular branches, a web-like appearance. Filling defect within posterior basal segment branch left lower lobe appears central and is age indeterminate. There is a somewhat eccentric filling defect within right lower lobe segmental branch point on 6:158, age-indeterminate. No indication of right  heart strain. Heart size is normal. No pericardial effusion. Mediastinum/Nodes: Negative Lungs/Pleura: Negative for lung infarct or pulmonary edema. No pneumonia, effusion, or pneumothorax. 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 Follow-up is recommended in this former smoker to ensure that there is not a small occult underlying obstructive lesion. Upper Abdomen: Possible hepatic steatosis, but certainty limited by contrast timing. Musculoskeletal: Negative Critical Value/emergent results were called by telephone at the time of interpretation on 11/27/2017 at 4:50 pm to Dr. PAddison Lank, who verbally acknowledged these results. Review of the MIP images confirms the above findings. IMPRESSION: 1. 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 Additional small segmental pulmonary embolism in the left lower lobe which may be acute. 2. Branching tubular density in the left lower lobe compatible with bronchocele. If no outside comparison follow-up noncontrast chest CT in 3-6 months is recommended to ensure there is no underlying obstructive lesion in this former smoker. Electronically Signed   By: JMonte FantasiaM.D.   On: 11/27/2017 16:50   Ct Abdomen Pelvis W Contrast  Result Date: 12/02/2017 CLINICAL DATA:  High pretest probability of pulmonary emboli. Mid back pain, shortness of breath. EXAM: CT ANGIOGRAPHY CHEST CT ABDOMEN AND PELVIS WITH CONTRAST TECHNIQUE: Multidetector CT imaging of the chest was performed using the standard protocol during bolus administration of intravenous contrast. Multiplanar CT image reconstructions and MIPs were obtained to evaluate the vascular anatomy. Multidetector CT imaging of the abdomen and pelvis was performed using the standard protocol during bolus administration of intravenous contrast. CONTRAST:  1067mISOVUE-370 IOPAMIDOL (ISOVUE-370) INJECTION 76% COMPARISON:   11/27/2017 FINDINGS: CTA CHEST FINDINGS Cardiovascular: Mild right atrial enlargement. Dilated right ventricle, RV/LV ratio 1.5. Large partially occlusive acute pulmonary embolus in the right pulmonary artery extending into the upper and lower lobe segmental branches.  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 Adequate contrast opacification of the thoracic aorta with no evidence of dissection, aneurysm, or stenosis. There is classic 3-vessel brachiocephalic arch anatomy without proximal stenosis. No significant atheromatous irregularity. Mediastinum/Nodes: No enlarged mediastinal, hilar, or axillary lymph nodes. Thyroid gland, trachea, and esophagus demonstrate no significant findings. Lungs/Pleura: Stable presumed bronchocele in the lateral basal segment left lower lobe. New interstitial infiltrate in the left upper lobe suprahilar region. Right lung clear. No pneumothorax. No pleural effusion. Musculoskeletal: No chest wall abnormality. No acute or significant osseous findings. Review of the MIP images confirms the above findings. CT ABDOMEN and PELVIS FINDINGS Hepatobiliary: No focal liver abnormality is seen. No gallstones, gallbladder wall thickening, or biliary dilatation. Pancreas: Unremarkable. No pancreatic ductal dilatation or surrounding inflammatory changes. Spleen: Normal in size without focal abnormality. Adrenals/Urinary Tract: Normal adrenals. 0.8 cm probable cyst, upper pole left kidney. 5.2 cm cyst, lower pole right kidney. No hydronephrosis. Urinary bladder incompletely distended. Stomach/Bowel: Stomach is nondistended. Small bowel decompressed. Anastomotic staple line in the terminal ileum. The colon is nondilated. Vascular/Lymphatic: No significant arterial pathology evident. Partially occlusive DVT in the right femoral, deep femoral, and common femoral  veins. There is also some partially occlusive thrombus in the right iliac vein just proximal to its confluence with the cava. IVC appears widely patent. Portal vein patent. No retroperitoneal hemorrhage. Reproductive: Uterus and bilateral adnexa are unremarkable. IUD in expected location. Other: No ascites.  No free air. Musculoskeletal: Edematous/inflammatory changes in the subcutaneous tissues of the visualized proximal right thigh. If regional bones unremarkable. Review of the MIP images confirms the above findings. IMPRESSION: 1. Positive for central partially occlusive right and segmental left lower lobe pulmonary emboli with CT evidence of right heart strain (RV/LV Ratio = 1.5) consistent with at least submassive (intermediate risk) PE. The presence of right heart strain has been associated with an increased risk of morbidity and mortality. Critical Value/emergent results were called by telephone at the time of interpretation on 12/02/2017 at 11:52 am to Dr. Harold Barban , who verbally acknowledged these results. 2. Residual partially occlusive DVT in the right lower extremity and right iliac vein. No evidence of caval thrombus. Electronically Signed   By: Lucrezia Europe M.D.   On: 12/02/2017 11:52   Anti-infectives: Anti-infectives (From admission, onward)   None      Assessment/Plan: s/p Procedure(s): PERIPHERAL VASCULAR THROMBECTOMY - Lysis Recheck (N/A) PERIPHERAL VASCULAR BALLOON ANGIOPLASTY (Right) Less tachypneic and tachycardic this morning.  Oxygen saturations stable.  Reviewed chest CT with Dr Pascal Lux from interventional radiology.  Apparently some miscommunication yesterday regarding interventional radiology consultation.  Patient does appear to be clinically better today than yesterday.  Will mobilize and continue to monitor on heparin drip.  2D echocardiogram Phillip Heal reviewed with normal LV function and possible mild right heart strain.  Discussed at length with the patient who understand   Will ask pulmonary-critical care medicine to see for an opinion as well.   LOS: 6 days   Todd Early 12/03/2017, 9:50 AM

## 2017-12-03 NOTE — Progress Notes (Signed)
ANTICOAGULATION CONSULT NOTE - Follow Up Consult  Pharmacy Consult for heparin Indication: pulmonary embolus and DVT  Allergies  Allergen Reactions  . Ibuprofen Other (See Comments)    Avoids due to Crohns disease  . Morphine And Related Itching    itchy  . Prednisone Other (See Comments)    "crazy", hallucinations  . Adhesive [Tape] Rash    Rash with electrodes    Patient Measurements: Height: 5' 4"  (162.6 cm) Weight: 220 lb (99.8 kg) IBW/kg (Calculated) : 54.7 Heparin Dosing Weight: 77.8kg  Vital Signs: Temp: 98.2 F (36.8 C) (02/02 0344) Temp Source: Oral (02/02 0344) BP: 132/69 (02/02 0809) Pulse Rate: 110 (02/02 0900)  Labs: Recent Labs    12/01/17 0427 12/01/17 1025 12/02/17 0237 12/02/17 0244 12/03/17 0623  HGB 9.4*  --   --  8.1* 7.9*  HCT 28.8*  --   --  25.5* 24.2*  PLT 209  --   --  183 260  HEPARINUNFRC 0.33  --  0.49  --  0.49  CREATININE  --  0.93  --  0.87  --     Estimated Creatinine Clearance: 99.6 mL/min (by C-G formula based on SCr of 0.87 mg/dL).  Medications: Heparin @ 2000 units/hr  Assessment: 4 yof presented to the ED with RLE pain and swelling. Found to have a DVT and PE. She was started on heparin and then underwent lysis with tpa on 1/29. Heparin was continued due to residual thrombus. Heparin level therapeutic at 0.49. CCM consulted and plan is now for EKOS and IVC filter. Hgb low stable, platelets wnl.   Goal of Therapy:  Heparin level 0.3-0.7 units/ml Monitor platelets by anticoagulation protocol: Yes   Plan:  1) Continue heparin at 2000 units/hr 2) Follow up after EKOS  Nena Jordan, PharmD, BCPS 12/03/2017 11:17 AM

## 2017-12-03 NOTE — Progress Notes (Signed)
Patient ID: Lindsay Mccarthy, female   DOB: 03/08/1976, 42 y.o.   MRN: [id number] Appreciate consultation from pulmonary/critical care medicine. Discussed with patient as well with her tachypnea and tachycardia and inability to maintain oxygen saturations on room air that would in all likelihood benefit from lysis of her significant right lung pulmonary embolus.  Relayed this follow-up to Dr. Pascal Lux who is currently in a procedure.  Critical care medicine also has suggested vena cava filter due to her history of Crohn's and rectal bleeding and recurrent pulmonary embolus by CT scan.  Agree that retrievable filter would be appropriate as well.

## 2017-12-03 NOTE — Consult Note (Signed)
Chief Complaint: Patient was seen in consultation today for Ekos thrombolysis and retrievable inferior vena cava filter placement Chief Complaint  Patient presents with  . leg pain/leg swelling   at the request of [name]  Referring Physician(s): [name]  Supervising Physician: Sandi Mariscal  Patient Status: [facility] - In-pt  History of Present Illness: Lindsay Mccarthy is a 42 y.o. female   SOB; acute hypoxemia ++ PE  CTA yesterday IMPRESSION: 1. Positive for central partially occlusive right and segmental left lower lobe pulmonary emboli with CT evidence of right heart strain (RV/LV Ratio = 1.5) consistent with at least submassive (intermediate risk) PE. The presence of right heart strain has been associated with an increased risk of morbidity and mortality. Critical Value/emergent results were called by telephone at the time of interpretation on 12/02/2017 at 11:52 am to Dr. Harold Barban , who verbally acknowledged these results. 2. Residual partially occlusive DVT in the right lower extremity and right iliac vein. No evidence of caval thrombus.  + DVT Rt LE overnight thrombolysis with Dr Trula Slade just 11/29/17  +SOB Tachycardia Tachypnea  Dr Donnetta Hutching note today: Appreciate consultation from pulmonary/critical care medicine. Discussed with patient as well with her tachypnea and tachycardia and inability to maintain oxygen saturations on room air that would in all likelihood benefit from lysis of her significant right lung pulmonary embolus.  Relayed this follow-up to Dr. Pascal Lux who is currently in a procedure.  Critical care medicine also has suggested vena cava filter due to her history of Crohn's and rectal bleeding and recurrent pulmonary embolus by CT scan.  Agree that retrievable filter would be appropriate as well  Request for pulmonary angiogram with Ekos thrombolysis and placement of retrievable inferior vena cava filter  Dr Pascal Lux has  reviewed imaging Approves procedures   Past Medical History:  Diagnosis Date  . Anemia    related to Crohns flares  . Anxiety   . Arthritis    knees, feet, hands, wrists, related to Crohns flare  . Asthma    Childhood  . B12 deficiency    monitored/treated by Dr. Carlean Purl  . Cervical dysplasia   . Crohn's disease of small intestine ([place]) 06/24/2012   Diagnosed 1999, in [place]. Ileitis only then. Treated with Imuran Remicade and prednisone.  Noncompliant with therapy. 2004 return to care and was treated with Remicade prednisone Pentasa Cipro and Flagyl. 2006 status post right hemicolectomy. Subsequently treated with azathioprine and Cimzia. 200 mg every other week.  azathioprine was added in 2010. Prometheus TP MT enzyme was negative. Has ha  . Depression   . Eczema    arms and behind knees, worse in winter  . History of recurrent UTI (urinary tract infection)   . HPV (human papilloma virus) infection   . Hyperlipemia   . Osteopenia of femur neck T. score -1.4 09/29/2011   09/2013 T-1.6  . Papanicolaou smear 12/12   last abnormal 2011  . Scaphoid fracture of wrist 2013   left  . Seasonal allergic rhinitis   . Shingles 09/2015   R hip  . Wears glasses     Past Surgical History:  Procedure Laterality Date  . APPENDECTOMY    . CERVICAL BIOPSY  W/ LOOP ELECTRODE EXCISION  2009   ---paps normal since  . COLONOSCOPY  multiple   scanned  . ESOPHAGOGASTRODUODENOSCOPY  multiple   scanned  . HEMICOLECTOMY  2006  . LEEP  2011   --done in [place]  . PERIPHERAL VASCULAR BALLOON ANGIOPLASTY  Right 11/30/2017   Procedure: PERIPHERAL VASCULAR BALLOON ANGIOPLASTY;  Surgeon: Serafina Mitchell, MD;  Location: [place] [facility];  Service: Cardiovascular;  Laterality: Right;  . PERIPHERAL VASCULAR THROMBECTOMY N/A 11/29/2017   Procedure: PERIPHERAL VASCULAR THROMBECTOMY - THROMBOLYSIS;  Surgeon: Serafina Mitchell, MD;  Location: [place] [facility];  Service: Cardiovascular;  Laterality:  N/A;  LYSIS CATHETER PLACEMENT ONLY  . PERIPHERAL VASCULAR THROMBECTOMY N/A 11/30/2017   Procedure: PERIPHERAL VASCULAR THROMBECTOMY - Lysis Recheck;  Surgeon: Serafina Mitchell, MD;  Location: [place] [facility];  Service: Cardiovascular;  Laterality: N/A;  . WISDOM TOOTH EXTRACTION      Allergies: Ibuprofen; Morphine and related; Prednisone; and Adhesive [tape]  Medications: Prior to Admission medications   Medication Sig Start Date End Date Taking? Authorizing Provider  acetaminophen (TYLENOL) 500 MG tablet Take 1,000 mg by mouth every 6 (six) hours as needed for headache (pain). Reported on 12/15/2015   Yes [provider]  aspirin 325 MG tablet Take 325 mg by mouth daily.   Yes [provider]  azaTHIOprine (IMURAN) 50 MG tablet Take 4 tablets by mouth every day Patient taking differently: Take 200 mg by mouth daily.  10/17/17  Yes Gatha Mayer, MD  buPROPion (WELLBUTRIN SR) 100 MG 12 hr tablet TAKE 1 TABLET BY MOUTH TWICE A DAY Patient taking differently: TAKE 1 TABLET (100 MG) BY MOUTH TWICE A DAY 11/15/17  Yes Rita Ohara, MD  cetirizine (ZYRTEC) 10 MG tablet Take 10 mg by mouth daily.   Yes [provider]  cyanocobalamin (,VITAMIN B-12,) 1000 MCG/ML injection INJECT AS DIRECTED EVERY 30 DAYS Patient taking differently: INJECT 1 ML (1000 MCG) INTRAMUSCULARLY EVERY 30 DAYS 03/03/17  Yes Gatha Mayer, MD  diphenhydrAMINE (SOMINEX) 25 MG tablet Take 25 mg by mouth at bedtime.    Yes [provider]  escitalopram (LEXAPRO) 10 MG tablet TAKE 1 TABLET (10 MG TOTAL) BY MOUTH DAILY. 10/17/17  Yes Rita Ohara, MD  fenofibrate 160 MG tablet Take 1 tablet (160 mg total) by mouth daily. Patient taking differently: Take 160 mg by mouth at bedtime.  01/17/17  Yes Rita Ohara, MD  ferrous sulfate 325 (65 FE) MG tablet Take 1-2 tablets daily with food - at least 1x/day Patient taking differently: Take 325 mg by mouth 2 (two) times daily with a meal.  10/05/17  Yes  Gatha Mayer, MD  levonorgestrel (MIRENA) 20 MCG/24HR IUD 1 each by Intrauterine route once. Implanted April 2017 05/19/11  Yes [provider]  Menthol, Topical Analgesic, (BIOFREEZE EX) Apply 1 application topically 3 (three) times daily as needed (pain).   Yes [provider]  Omega-3 Fatty Acids (FISH OIL) 1200 MG CAPS Take 1,200 mg by mouth 2 (two) times daily.   Yes [provider]  traMADol (ULTRAM) 50 MG tablet Take 50 mg by mouth daily as needed (pain).   Yes [provider]  Vedolizumab (ENTYVIO IV) Inject into the vein See admin instructions. Administer once every 8 weeks, ordered by Dr. Carlean Purl (last injection 10/31/17)   Yes [provider]  azaTHIOprine (IMURAN) 50 MG tablet Take 4 tablets (200 mg total) by mouth daily. Patient not taking: Reported on 11/27/2017 10/05/17   Gatha Mayer, MD  celecoxib (CELEBREX) 200 MG capsule Take 1 capsule (200 mg total) by mouth 2 (two) times daily as needed. Patient not taking: Reported on 11/27/2017 05/11/17   Gatha Mayer, MD     Family History  Problem Relation  Age of Onset  . Hypertension Mother   . Breast cancer Mother 32  . Bone cancer Mother   . Colon polyps Father   . Diabetes Father        borderline  . Hypertension Father   . Hyperlipidemia Father   . Colon cancer Paternal Grandfather   . Multiple sclerosis Sister   . Alcohol abuse Sister   . Stroke Maternal Grandmother   . Lung cancer Maternal Grandmother   . Hypertension Maternal Grandmother   . Heart disease Paternal Grandmother   . Hypertension Paternal Grandmother     Social History   Socioeconomic History  . Marital status: Married    Spouse name: None  . Number of children: 0  . Years of education: None  . Highest education level: None  Social Needs  . Financial resource strain: None  . Food insecurity - worry: None  . Food insecurity - inability: None  . Transportation needs - medical: None  .  Transportation needs - non-medical: None  Occupational History  . Occupation: Teacher, early years/pre  Tobacco Use  . Smoking status: Former Smoker    Packs/day: 1.00    Years: 4.00    Pack years: 4.00    Last attempt to quit: 11/18/1998    Years since quitting: 19.0  . Smokeless tobacco: Never Used  Substance and Sexual Activity  . Alcohol use: No    Alcohol/week: 0.0 oz  . Drug use: No  . Sexual activity: Yes    Partners: Male    Birth control/protection: IUD    Comment: Mirena inserted 01/2016  Other Topics Concern  . None  Social History Narrative   The patient is divorced.     Re-married Marcello Moores, partner of 10 years, on 10/10/16. 1 cats, 1 dog   No children - doesn't want any   [place].   Moved from [place] to [place] in 2013.   Past smoker   No alcohol   2 caffeinated beverages a day   She reports she is compliant with sunscreen given her increased risk of sun damage and skin cancer on azathioprine      Updated 12/15/16    Review of Systems: A 12 point ROS discussed and pertinent positives are indicated in the HPI above.  All other systems are negative.  Review of Systems  Constitutional: Positive for activity change and fatigue. Negative for fever.  Respiratory: Positive for shortness of breath.   Cardiovascular: Positive for leg swelling. Negative for chest pain.  Neurological: Positive for weakness.  Psychiatric/Behavioral: Negative for behavioral problems and confusion.    Vital Signs: BP 132/69   Pulse (!) 110   Temp 98.2 F (36.8 C) (Oral)   Resp (!) 27   Ht 5' 4"  (1.626 m)   Wt 220 lb (99.8 kg)   SpO2 100%   BMI 37.76 kg/m   Physical Exam  Constitutional: She is oriented to person, place, and time.  Cardiovascular: Normal rate and regular rhythm.  Pulmonary/Chest: Effort normal. She has wheezes.  Abdominal: Soft. Bowel sounds are normal.  Musculoskeletal: Normal range of motion.  Neurological: She is alert and oriented to  person, place, and time.  Skin: Skin is warm and dry.  Psychiatric: She has a normal mood and affect. Her behavior is normal. Judgment and thought content normal.  Nursing note and vitals reviewed.   Imaging: Ct Angio Chest Pe W Or Wo Contrast  Result Date: 12/02/2017 CLINICAL DATA:  High pretest probability of  pulmonary emboli. Mid back pain, shortness of breath. EXAM: CT ANGIOGRAPHY CHEST CT ABDOMEN AND PELVIS WITH CONTRAST TECHNIQUE: Multidetector CT imaging of the chest was performed using the standard protocol during bolus administration of intravenous contrast. Multiplanar CT image reconstructions and MIPs were obtained to evaluate the vascular anatomy. Multidetector CT imaging of the abdomen and pelvis was performed using the standard protocol during bolus administration of intravenous contrast. CONTRAST:  13m ISOVUE-370 IOPAMIDOL (ISOVUE-370) INJECTION 76% COMPARISON:  11/27/2017 FINDINGS: CTA CHEST FINDINGS Cardiovascular: Mild right atrial enlargement. Dilated right ventricle, RV/LV ratio 1.5. Large partially occlusive acute pulmonary embolus in the right pulmonary artery extending into the upper and lower lobe segmental branches. 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 Adequate contrast opacification of the thoracic aorta with no evidence of dissection, aneurysm, or stenosis. There is classic 3-vessel brachiocephalic arch anatomy without proximal stenosis. No significant atheromatous irregularity. Mediastinum/Nodes: No enlarged mediastinal, hilar, or axillary lymph nodes. Thyroid gland, trachea, and esophagus demonstrate no significant findings. Lungs/Pleura: Stable presumed bronchocele in the lateral basal segment left lower lobe. New interstitial infiltrate in the left upper lobe suprahilar region. Right lung clear. No pneumothorax. No pleural  effusion. Musculoskeletal: No chest wall abnormality. No acute or significant osseous findings. Review of the MIP images confirms the above findings. CT ABDOMEN and PELVIS FINDINGS Hepatobiliary: No focal liver abnormality is seen. No gallstones, gallbladder wall thickening, or biliary dilatation. Pancreas: Unremarkable. No pancreatic ductal dilatation or surrounding inflammatory changes. Spleen: Normal in size without focal abnormality. Adrenals/Urinary Tract: Normal adrenals. 0.8 cm probable cyst, upper pole left kidney. 5.2 cm cyst, lower pole right kidney. No hydronephrosis. Urinary bladder incompletely distended. Stomach/Bowel: Stomach is nondistended. Small bowel decompressed. Anastomotic staple line in the terminal ileum. The colon is nondilated. Vascular/Lymphatic: No significant arterial pathology evident. Partially occlusive DVT in the right femoral, deep femoral, and common femoral veins. There is also some partially occlusive thrombus in the right iliac vein just proximal to its confluence with the cava. IVC appears widely patent. Portal vein patent. No retroperitoneal hemorrhage. Reproductive: Uterus and bilateral adnexa are unremarkable. IUD in expected location. Other: No ascites.  No free air. Musculoskeletal: Edematous/inflammatory changes in the subcutaneous tissues of the visualized proximal right thigh. If regional bones unremarkable. Review of the MIP images confirms the above findings. IMPRESSION: 1. Positive for central partially occlusive right and segmental left lower lobe pulmonary emboli with CT evidence of right heart strain (RV/LV Ratio = 1.5) consistent with at least submassive (intermediate risk) PE. The presence of right heart strain has been associated with an increased risk of morbidity and mortality. Critical Value/emergent results were called by telephone at the time of interpretation on 12/02/2017 at 11:52 am to Dr. VHarold Barban, who verbally acknowledged these results. 2.  Residual partially occlusive DVT in the right lower extremity and right iliac vein. No evidence of caval thrombus. Electronically Signed   By: DLucrezia EuropeM.D.   On: 12/02/2017 11:52   Ct Angio Chest Pe W And/or Wo Contrast  Result Date: 11/27/2017 CLINICAL DATA:  High pretest probability for pulmonary embolism. One day of leg swelling on the right. History of DVT. EXAM: CT ANGIOGRAPHY CHEST WITH CONTRAST TECHNIQUE: Multidetector CT imaging of the chest was performed using the standard protocol during bolus administration of intravenous contrast. Multiplanar CT image reconstructions and MIPs were obtained to evaluate the vascular anatomy. CONTRAST:  160m ISOVUE-370 IOPAMIDOL (ISOVUE-370) INJECTION 76% COMPARISON:  None. FINDINGS: Cardiovascular: Satisfactory opacification of the pulmonary arteries. There is a linear eccentric filling defect within the left arterial tree at the bifurcation of the lower lobe and lingular branches, a web-like appearance. Filling defect within posterior basal segment branch left lower lobe appears central and is age indeterminate. There is a somewhat eccentric filling defect within right lower lobe segmental branch point on 6:158, age-indeterminate. No indication of right heart strain. Heart size is normal. No pericardial effusion. Mediastinum/Nodes: Negative Lungs/Pleura: Negative for lung infarct or pulmonary edema. No pneumonia, effusion, or pneumothorax. 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 Follow-up is recommended in this former smoker to ensure that there is not a small occult underlying obstructive lesion. Upper Abdomen: Possible hepatic steatosis, but certainty limited by contrast timing. Musculoskeletal: Negative Critical Value/emergent results were called by telephone at the time of interpretation on 11/27/2017 at 4:50 pm to Dr. PAddison Lank, who verbally acknowledged these results. Review  of the MIP images confirms the above findings. IMPRESSION: 1. 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 Additional small segmental pulmonary embolism in the left lower lobe which may be acute. 2. Branching tubular density in the left lower lobe compatible with bronchocele. If no outside comparison follow-up noncontrast chest CT in 3-6 months is recommended to ensure there is no underlying obstructive lesion in this former smoker. Electronically Signed   By: JMonte FantasiaM.D.   On: 11/27/2017 16:50   Ct Abdomen Pelvis W Contrast  Result Date: 12/02/2017 CLINICAL DATA:  High pretest probability of pulmonary emboli. Mid back pain, shortness of breath. EXAM: CT ANGIOGRAPHY CHEST CT ABDOMEN AND PELVIS WITH CONTRAST TECHNIQUE: Multidetector CT imaging of the chest was performed using the standard protocol during bolus administration of intravenous contrast. Multiplanar CT image reconstructions and MIPs were obtained to evaluate the vascular anatomy. Multidetector CT imaging of the abdomen and pelvis was performed using the standard protocol during bolus administration of intravenous contrast. CONTRAST:  1070mISOVUE-370 IOPAMIDOL (ISOVUE-370) INJECTION 76% COMPARISON:  11/27/2017 FINDINGS: CTA CHEST FINDINGS Cardiovascular: Mild right atrial enlargement. Dilated right ventricle, RV/LV ratio 1.5. Large partially occlusive acute pulmonary embolus in the right pulmonary artery extending into the upper and lower lobe segmental branches. 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 Adequate contrast opacification of the thoracic aorta with no evidence of dissection, aneurysm, or stenosis. There is classic 3-vessel brachiocephalic arch anatomy without proximal stenosis. No significant atheromatous irregularity. Mediastinum/Nodes: No enlarged mediastinal,  hilar, or axillary lymph nodes. Thyroid gland, trachea, and esophagus demonstrate no significant findings. Lungs/Pleura: Stable presumed bronchocele in the lateral basal segment left lower lobe. New interstitial infiltrate in the left upper lobe suprahilar region. Right lung clear. No pneumothorax. No pleural effusion. Musculoskeletal: No chest wall abnormality. No acute or significant osseous findings. Review of the MIP images confirms the above findings. CT ABDOMEN and PELVIS FINDINGS Hepatobiliary: No focal liver abnormality is seen. No gallstones, gallbladder wall thickening, or biliary dilatation. Pancreas: Unremarkable. No pancreatic ductal dilatation or surrounding inflammatory changes. Spleen: Normal in size without focal abnormality. Adrenals/Urinary Tract: Normal adrenals. 0.8 cm probable cyst, upper pole left kidney. 5.2 cm cyst, lower pole right kidney. No hydronephrosis. Urinary bladder incompletely distended. Stomach/Bowel: Stomach is nondistended. Small bowel decompressed. Anastomotic staple line in the  terminal ileum. The colon is nondilated. Vascular/Lymphatic: No significant arterial pathology evident. Partially occlusive DVT in the right femoral, deep femoral, and common femoral veins. There is also some partially occlusive thrombus in the right iliac vein just proximal to its confluence with the cava. IVC appears widely patent. Portal vein patent. No retroperitoneal hemorrhage. Reproductive: Uterus and bilateral adnexa are unremarkable. IUD in expected location. Other: No ascites.  No free air. Musculoskeletal: Edematous/inflammatory changes in the subcutaneous tissues of the visualized proximal right thigh. If regional bones unremarkable. Review of the MIP images confirms the above findings. IMPRESSION: 1. Positive for central partially occlusive right and segmental left lower lobe pulmonary emboli with CT evidence of right heart strain (RV/LV Ratio = 1.5) consistent with at least submassive  (intermediate risk) PE. The presence of right heart strain has been associated with an increased risk of morbidity and mortality. Critical Value/emergent results were called by telephone at the time of interpretation on 12/02/2017 at 11:52 am to Dr. Harold Barban , who verbally acknowledged these results. 2. Residual partially occlusive DVT in the right lower extremity and right iliac vein. No evidence of caval thrombus. Electronically Signed   By: Lucrezia Europe M.D.   On: 12/02/2017 11:52    Labs:  CBC: Recent Labs    11/30/17 0732 12/01/17 0427 12/02/17 0244 12/03/17 0623  WBC 8.1 7.4 6.5 5.9  HGB 9.4* 9.4* 8.1* 7.9*  HCT 29.1* 28.8* 25.5* 24.2*  PLT 226 209 183 260    COAGS: Recent Labs    11/27/17 1841 11/29/17 0241  INR 1.21 1.09    BMP: Recent Labs    11/29/17 0241 11/30/17 0215 12/01/17 1025 12/02/17 0244  NA 135 135 139 139  K 3.5 3.6 3.3* 3.2*  CL 103 104 109 108  CO2 24 22 19* 21*  GLUCOSE 96 110* 109* 93  BUN 6 <5* 6 5*  CALCIUM 7.7* 7.3* 7.5* 7.5*  CREATININE 0.90 0.94 0.93 0.87  GFRNONAA >60 >60 >60 >60  GFRAA >60 >60 >60 >60    LIVER FUNCTION TESTS: Recent Labs    06/03/17 1435 09/30/17 1634 10/21/17 1551 11/28/17 0806  BILITOT 0.2 0.3 0.2 0.3  AST 14 13 12 19   ALT 8 9 8  12*  ALKPHOS 31* 39 44 36*  PROT 6.6 6.9 7.4 6.2*  ALBUMIN 3.5 3.6 3.7 2.9*    TUMOR MARKERS: No results for input(s): AFPTM, CEA, CA199, CHROMGRNA in the last 8760 hours.  Assessment and Plan:  Hypoxic SOB + PE with Rt heart strain per CTA Recent known + RLE DVT and thrombolysis with Dr Trula Slade 11/29/17 Now scheduled for pulmonary angiogram with Ekos Thrombolysis  Also for Retrievable inferior vena cava filter placement  Risks and benefits discussed with the patient including, but not limited to bleeding, infection, contrast induced renal failure, filter fracture or migration which can lead to emergency surgery or even death, strut penetration with damage or irritation  to adjacent structures and caval thrombosis. All of the patient's questions were answered, patient is agreeable to proceed. Consent signed and in chart.  Risks and benefits discussed with the patient including, but not limited to bleeding, possible life threatening bleeding and need for blood product transfusion, vascular injury, stroke, contrast induced renal failure All of the patient's questions were answered, patient is agreeable to proceed. Consent signed and in chart.  Thank you for this interesting consult.  I greatly enjoyed meeting SHAIANN MCMANAMON and look forward to participating in their care.  A copy  of this report was sent to the requesting provider on this date.  Electronically Signed: Lavonia Drafts, PA-C 12/03/2017, 11:31 AM   I spent a total of 40 Minutes    in face to face in clinical consultation, greater than 50% of which was counseling/coordinating care for PE lysis and IVC filter placement

## 2017-12-03 NOTE — Progress Notes (Signed)
Pt has been having increased back pain. Fentanyl has been given, ice pack, heating pad and oxycodone has been given as well. MD has been paged, waiting for a call back. Will continue to monitor.

## 2017-12-03 NOTE — Progress Notes (Signed)
ANTICOAGULATION CONSULT NOTE - Follow Up Consult  Pharmacy Consult for heparin Indication: pulmonary embolus and DVT  Allergies  Allergen Reactions  . Ibuprofen Other (See Comments)    Avoids due to Crohns disease  . Morphine And Related Itching    itchy  . Prednisone Other (See Comments)    "crazy", hallucinations  . Adhesive [Tape] Rash    Rash with electrodes    Patient Measurements: Height: 5' 4"  (162.6 cm) Weight: 220 lb (99.8 kg) IBW/kg (Calculated) : 54.7 Heparin Dosing Weight: 77.8kg  Vital Signs: Temp: 98.1 F (36.7 C) (02/02 1600) Temp Source: Oral (02/02 1600) BP: 134/85 (02/02 1800) Pulse Rate: 113 (02/02 1826)  Labs: Recent Labs    12/01/17 1025 12/02/17 0237 12/02/17 0244 12/03/17 0623 12/03/17 1703  HGB  --   --  8.1* 7.9* 7.8*  HCT  --   --  25.5* 24.2* 23.7*  PLT  --   --  183 260 302  HEPARINUNFRC  --  0.49  --  0.49 0.34  CREATININE 0.93  --  0.87  --   --     Estimated Creatinine Clearance: 99.6 mL/min (by C-G formula based on SCr of 0.87 mg/dL).  Medications: Heparin @ 2000 units/hr  Assessment: 89 yof presented to the ED with RLE pain and swelling. Found to have a DVT and PE. She was started on heparin and then underwent lysis with tpa on 1/29. Heparin was continued due to residual thrombus.   S/p Ekos First heparin level therapeutic  Goal of Therapy:  Heparin level 0.3-0.7 units/ml Monitor platelets by anticoagulation protocol: Yes   Plan:  1) Continue heparin at 2000 units/hr 2) Follow up heparin level at 2300 pm  Thank you Anette Guarneri, PharmD [phone]  12/03/2017 6:33 PM

## 2017-12-03 NOTE — Sedation Documentation (Signed)
PAP 64/19 (39)

## 2017-12-03 NOTE — Progress Notes (Signed)
Patient left  [facility] headed to interventional radiology at 1345pm  Mervyn Skeeters, RN

## 2017-12-03 NOTE — Procedures (Signed)
Pre-procedure Diagnosis: DVT and Submassive PE Post-procedure Diagnosis: Same  Post initiation of bilateral US assisted pulmonary arterial thrombolysis.    Pre procedural Main PA pressure measurements - 46/19 (mean - 39)  Complications: None Immediate  EBL: None   SignedSandi Mariscal Pager: [phone] 12/03/2017, 3:33 PM

## 2017-12-03 NOTE — Consult Note (Signed)
Name: Lindsay Mccarthy MRN: [id number] DOB: 04/12/1976    ADMISSION DATE:  11/27/2017 CONSULTATION DATE: December 03, 2017  REFERRING MD : Vascular surgery CHIEF COMPLAINT: Right lower extremity pain shortness of breath  BRIEF PATIENT DESCRIPTION: Patient is 42 year old Caucasian female with extensive DVT and PE she had thrombectomy mechanical on Tuesday and Wednesday with recurrence of more PE clot load with extensive right lower extremity DVT  SIGNIFICANT EVENTS  Echo with McConnell sign  STUDIES:  CT with more clot burden the RV dilatation RV to LV ratio is one-point more than 1.5   HISTORY OF PRESENT ILLNESS: This is a 42 year old Caucasian female with history of Crohn's disease on biological therapy and Imuran and relapse who presents to the hospital few days history of right lower extremity swelling pain shortness of breath heaviness in her chest she was found to have PE and extensive DVT on Wednesday and Tuesday of last week she had mechanical thrombectomy repeat CAT scan was done yesterday showed more clot burden with RV dilatation McConnell sign  RV to LV ratio more than 1.5 patient still having heavy breathing hypoxic when she is not on oxygen tachypneic and tachycardic she cannot ambulate without feeling very short of breath patient had extensive history of Crohn's disease about a month ago she had bloody stools with her diarrhea.  She had been working as an IT person and has not had any evidence with with provoked PE she is considering herself has not a sedentary lifestyle where she moves.  She had no recent orthopedic surgery her contraceptive Mirena implant has been there for a long time.  Patient has no history of recurrent DVTs or family history of hypercoagulable state she does not smoke she has IUD PAST MEDICAL HISTORY :   has a past medical history of Anemia, Anxiety, Arthritis, Asthma, B12 deficiency, Cervical dysplasia, Crohn's disease of small intestine ([place]) (06/24/2012),  Depression, Eczema, History of recurrent UTI (urinary tract infection), HPV (human papilloma virus) infection, Hyperlipemia, Osteopenia of femur neck T. score -1.4 (09/29/2011), Papanicolaou smear (12/12), Scaphoid fracture of wrist (2013), Seasonal allergic rhinitis, Shingles (09/2015), and Wears glasses.  has a past surgical history that includes LEEP (2011); Colonoscopy (multiple); Esophagogastroduodenoscopy (multiple); Hemicolectomy (2006); Appendectomy; Wisdom tooth extraction; Cervical biopsy w/ loop electrode excision (2009); PERIPHERAL VASCULAR THROMBECTOMY (N/A, 11/29/2017); PERIPHERAL VASCULAR THROMBECTOMY (N/A, 11/30/2017); and PERIPHERAL VASCULAR BALLOON ANGIOPLASTY (Right, 11/30/2017). Prior to Admission medications   Medication Sig Start Date End Date Taking? Authorizing Provider  acetaminophen (TYLENOL) 500 MG tablet Take 1,000 mg by mouth every 6 (six) hours as needed for headache (pain). Reported on 12/15/2015   Yes [provider]  aspirin 325 MG tablet Take 325 mg by mouth daily.   Yes [provider]  azaTHIOprine (IMURAN) 50 MG tablet Take 4 tablets by mouth every day Patient taking differently: Take 200 mg by mouth daily.  10/17/17  Yes Gatha Mayer, MD  buPROPion (WELLBUTRIN SR) 100 MG 12 hr tablet TAKE 1 TABLET BY MOUTH TWICE A DAY Patient taking differently: TAKE 1 TABLET (100 MG) BY MOUTH TWICE A DAY 11/15/17  Yes Rita Ohara, MD  cetirizine (ZYRTEC) 10 MG tablet Take 10 mg by mouth daily.   Yes [provider]  cyanocobalamin (,VITAMIN B-12,) 1000 MCG/ML injection INJECT AS DIRECTED EVERY 30 DAYS Patient taking differently: INJECT 1 ML (1000 MCG) INTRAMUSCULARLY EVERY 30 DAYS 03/03/17  Yes Gatha Mayer, MD  diphenhydrAMINE (SOMINEX) 25 MG tablet Take 25 mg by mouth at  bedtime.    Yes [provider]  escitalopram (LEXAPRO) 10 MG tablet TAKE 1 TABLET (10 MG TOTAL) BY MOUTH DAILY. 10/17/17  Yes Rita Ohara, MD  fenofibrate 160 MG tablet Take 1  tablet (160 mg total) by mouth daily. Patient taking differently: Take 160 mg by mouth at bedtime.  01/17/17  Yes Rita Ohara, MD  ferrous sulfate 325 (65 FE) MG tablet Take 1-2 tablets daily with food - at least 1x/day Patient taking differently: Take 325 mg by mouth 2 (two) times daily with a meal.  10/05/17  Yes Gatha Mayer, MD  levonorgestrel (MIRENA) 20 MCG/24HR IUD 1 each by Intrauterine route once. Implanted April 2017 05/19/11  Yes [provider]  Menthol, Topical Analgesic, (BIOFREEZE EX) Apply 1 application topically 3 (three) times daily as needed (pain).   Yes [provider]  Omega-3 Fatty Acids (FISH OIL) 1200 MG CAPS Take 1,200 mg by mouth 2 (two) times daily.   Yes [provider]  traMADol (ULTRAM) 50 MG tablet Take 50 mg by mouth daily as needed (pain).   Yes [provider]  Vedolizumab (ENTYVIO IV) Inject into the vein See admin instructions. Administer once every 8 weeks, ordered by Dr. Carlean Purl (last injection 10/31/17)   Yes [provider]  azaTHIOprine (IMURAN) 50 MG tablet Take 4 tablets (200 mg total) by mouth daily. Patient not taking: Reported on 11/27/2017 10/05/17   Gatha Mayer, MD  celecoxib (CELEBREX) 200 MG capsule Take 1 capsule (200 mg total) by mouth 2 (two) times daily as needed. Patient not taking: Reported on 11/27/2017 05/11/17   Gatha Mayer, MD   Allergies  Allergen Reactions  . Ibuprofen Other (See Comments)    Avoids due to Crohns disease  . Morphine And Related Itching    itchy  . Prednisone Other (See Comments)    "crazy", hallucinations  . Adhesive [Tape] Rash    Rash with electrodes    FAMILY HISTORY:  family history includes Alcohol abuse in her sister; Bone cancer in her mother; Breast cancer (age of onset: 54) in her mother; Colon cancer in her paternal grandfather; Colon polyps in her father; Diabetes in her father; Heart disease in her paternal grandmother; Hyperlipidemia in her father;  Hypertension in her father, maternal grandmother, mother, and paternal grandmother; Lung cancer in her maternal grandmother; Multiple sclerosis in her sister; Stroke in her maternal grandmother. SOCIAL HISTORY:  reports that she quit smoking about 19 years ago. She has a 4.00 pack-year smoking history. she has never used smokeless tobacco. She reports that she does not drink alcohol or use drugs.  REVIEW OF SYSTEMS:   Constitutional: Negative for fever, chills, weight loss, malaise/fatigue and diaphoresis.  HENT: Negative for hearing loss, ear pain, nosebleeds, congestion, sore throat, neck pain, tinnitus and ear discharge.   Eyes: Negative for blurred vision, double vision, photophobia, pain, discharge and redness.  Respiratory: Negative for cough, hemoptysis, sputum production, shortness of breath, wheezing and stridor.   Cardiovascular: Negative for chest pain, palpitations, orthopnea, claudication, leg swelling and PND.  Gastrointestinal: Negative for heartburn, nausea, vomiting, abdominal pain, diarrhea, constipation, blood in stool and melena.  Genitourinary: Negative for dysuria, urgency, frequency, hematuria and flank pain.  Musculoskeletal: Negative for myalgias, back pain, joint pain and falls.  Skin: Negative for itching and rash.  Neurological: Negative for dizziness, tingling, tremors, sensory change, speech change, focal weakness, seizures, loss of consciousness, weakness and headaches.  Endo/Heme/Allergies: Negative for environmental allergies and polydipsia. Does not  bruise/bleed easily.  SUBJECTIVE:   VITAL SIGNS: Temp:  [97.7 F (36.5 C)-98.6 F (37 C)] 98.2 F (36.8 C) (02/02 0344) Pulse Rate:  [94-110] 110 (02/02 0900) Resp:  [14-37] 37 (02/02 0900) BP: (90-132)/(53-89) 132/69 (02/02 0809) SpO2:  [92 %-100 %] 100 % (02/02 0900)  PHYSICAL EXAMINATION: General: Mild acute distress from shortness of breath tachypnea she is tachycardic Neuro:  WNL , AOX3 , EOMI , CN  II-XII intact , UL , LL strength is symmetrical and 5/5 HEENT:  atraumatic , no jaundice , dry mucous membranes  Cardiovascular: Tachycardia normal sinus rhythm, ESM 2/6 in the aortic area  Lungs:  CTA bilateral , no wheezing or crackles  Abdomen:  Soft lax +BS , no tenderness . Musculoskeletal:  WNL , normal pulses  Skin:  No rash    Recent Labs  Lab 11/30/17 0215 12/01/17 1025 12/02/17 0244  NA 135 139 139  K 3.6 3.3* 3.2*  CL 104 109 108  CO2 22 19* 21*  BUN <5* 6 5*  CREATININE 0.94 0.93 0.87  GLUCOSE 110* 109* 93   Recent Labs  Lab 12/01/17 0427 12/02/17 0244 12/03/17 0623  HGB 9.4* 8.1* 7.9*  HCT 28.8* 25.5* 24.2*  WBC 7.4 6.5 5.9  PLT 209 183 260   Ct Angio Chest Pe W Or Wo Contrast  Result Date: 12/02/2017 CLINICAL DATA:  High pretest probability of pulmonary emboli. Mid back pain, shortness of breath. EXAM: CT ANGIOGRAPHY CHEST CT ABDOMEN AND PELVIS WITH CONTRAST TECHNIQUE: Multidetector CT imaging of the chest was performed using the standard protocol during bolus administration of intravenous contrast. Multiplanar CT image reconstructions and MIPs were obtained to evaluate the vascular anatomy. Multidetector CT imaging of the abdomen and pelvis was performed using the standard protocol during bolus administration of intravenous contrast. CONTRAST:  119m ISOVUE-370 IOPAMIDOL (ISOVUE-370) INJECTION 76% COMPARISON:  11/27/2017 FINDINGS: CTA CHEST FINDINGS Cardiovascular: Mild right atrial enlargement. Dilated right ventricle, RV/LV ratio 1.5. Large partially occlusive acute pulmonary embolus in the right pulmonary artery extending into the upper and lower lobe segmental branches. 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 Adequate contrast opacification of the thoracic aorta with no evidence of dissection, aneurysm, or stenosis. There  is classic 3-vessel brachiocephalic arch anatomy without proximal stenosis. No significant atheromatous irregularity. Mediastinum/Nodes: No enlarged mediastinal, hilar, or axillary lymph nodes. Thyroid gland, trachea, and esophagus demonstrate no significant findings. Lungs/Pleura: Stable presumed bronchocele in the lateral basal segment left lower lobe. New interstitial infiltrate in the left upper lobe suprahilar region. Right lung clear. No pneumothorax. No pleural effusion. Musculoskeletal: No chest wall abnormality. No acute or significant osseous findings. Review of the MIP images confirms the above findings. CT ABDOMEN and PELVIS FINDINGS Hepatobiliary: No focal liver abnormality is seen. No gallstones, gallbladder wall thickening, or biliary dilatation. Pancreas: Unremarkable. No pancreatic ductal dilatation or surrounding inflammatory changes. Spleen: Normal in size without focal abnormality. Adrenals/Urinary Tract: Normal adrenals. 0.8 cm probable cyst, upper pole left kidney. 5.2 cm cyst, lower pole right kidney. No hydronephrosis. Urinary bladder incompletely distended. Stomach/Bowel: Stomach is nondistended. Small bowel decompressed. Anastomotic staple line in the terminal ileum. The colon is nondilated. Vascular/Lymphatic: No significant arterial pathology evident. Partially occlusive DVT in the right femoral, deep femoral, and common femoral veins. There is also some partially occlusive thrombus in the right iliac vein just proximal to its confluence with the cava.  IVC appears widely patent. Portal vein patent. No retroperitoneal hemorrhage. Reproductive: Uterus and bilateral adnexa are unremarkable. IUD in expected location. Other: No ascites.  No free air. Musculoskeletal: Edematous/inflammatory changes in the subcutaneous tissues of the visualized proximal right thigh. If regional bones unremarkable. Review of the MIP images confirms the above findings. IMPRESSION: 1. Positive for central partially  occlusive right and segmental left lower lobe pulmonary emboli with CT evidence of right heart strain (RV/LV Ratio = 1.5) consistent with at least submassive (intermediate risk) PE. The presence of right heart strain has been associated with an increased risk of morbidity and mortality. Critical Value/emergent results were called by telephone at the time of interpretation on 12/02/2017 at 11:52 am to Dr. Harold Barban , who verbally acknowledged these results. 2. Residual partially occlusive DVT in the right lower extremity and right iliac vein. No evidence of caval thrombus. Electronically Signed   By: Lucrezia Europe M.D.   On: 12/02/2017 11:52   Ct Abdomen Pelvis W Contrast  Result Date: 12/02/2017 CLINICAL DATA:  High pretest probability of pulmonary emboli. Mid back pain, shortness of breath. EXAM: CT ANGIOGRAPHY CHEST CT ABDOMEN AND PELVIS WITH CONTRAST TECHNIQUE: Multidetector CT imaging of the chest was performed using the standard protocol during bolus administration of intravenous contrast. Multiplanar CT image reconstructions and MIPs were obtained to evaluate the vascular anatomy. Multidetector CT imaging of the abdomen and pelvis was performed using the standard protocol during bolus administration of intravenous contrast. CONTRAST:  142m ISOVUE-370 IOPAMIDOL (ISOVUE-370) INJECTION 76% COMPARISON:  11/27/2017 FINDINGS: CTA CHEST FINDINGS Cardiovascular: Mild right atrial enlargement. Dilated right ventricle, RV/LV ratio 1.5. Large partially occlusive acute pulmonary embolus in the right pulmonary artery extending into the upper and lower lobe segmental branches. 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 Adequate contrast opacification of the thoracic aorta with no evidence of dissection, aneurysm, or stenosis. There is classic 3-vessel brachiocephalic arch anatomy  without proximal stenosis. No significant atheromatous irregularity. Mediastinum/Nodes: No enlarged mediastinal, hilar, or axillary lymph nodes. Thyroid gland, trachea, and esophagus demonstrate no significant findings. Lungs/Pleura: Stable presumed bronchocele in the lateral basal segment left lower lobe. New interstitial infiltrate in the left upper lobe suprahilar region. Right lung clear. No pneumothorax. No pleural effusion. Musculoskeletal: No chest wall abnormality. No acute or significant osseous findings. Review of the MIP images confirms the above findings. CT ABDOMEN and PELVIS FINDINGS Hepatobiliary: No focal liver abnormality is seen. No gallstones, gallbladder wall thickening, or biliary dilatation. Pancreas: Unremarkable. No pancreatic ductal dilatation or surrounding inflammatory changes. Spleen: Normal in size without focal abnormality. Adrenals/Urinary Tract: Normal adrenals. 0.8 cm probable cyst, upper pole left kidney. 5.2 cm cyst, lower pole right kidney. No hydronephrosis. Urinary bladder incompletely distended. Stomach/Bowel: Stomach is nondistended. Small bowel decompressed. Anastomotic staple line in the terminal ileum. The colon is nondilated. Vascular/Lymphatic: No significant arterial pathology evident. Partially occlusive DVT in the right femoral, deep femoral, and common femoral veins. There is also some partially occlusive thrombus in the right iliac vein just proximal to its confluence with the cava. IVC appears widely patent. Portal vein patent. No retroperitoneal hemorrhage. Reproductive: Uterus and bilateral adnexa are unremarkable. IUD in expected location. Other: No ascites.  No free air. Musculoskeletal: Edematous/inflammatory changes in the subcutaneous tissues of the visualized proximal right thigh. If regional bones unremarkable. Review of the MIP images confirms the above findings. IMPRESSION: 1.  Positive for central partially occlusive right and segmental left lower lobe  pulmonary emboli with CT evidence of right heart strain (RV/LV Ratio = 1.5) consistent with at least submassive (intermediate risk) PE. The presence of right heart strain has been associated with an increased risk of morbidity and mortality. Critical Value/emergent results were called by telephone at the time of interpretation on 12/02/2017 at 11:52 am to Dr. Harold Barban , who verbally acknowledged these results. 2. Residual partially occlusive DVT in the right lower extremity and right iliac vein. No evidence of caval thrombus. Electronically Signed   By: Lucrezia Europe M.D.   On: 12/02/2017 11:52    ASSESSMENT / PLAN:  --Submassive PE with acute hypoxemic respiratory failure tachypnea tachycardia with failure of medical treatment of heparin for 4 days at this point I did recommend patient goes through catheter directed thrombolysis by interventional radiology given her RV dilatation McConnell sign. --Given her acute extensive lower extremity DVT recommend placing IVC filter to the patient is over the treatment given her history of Crohn's disease and bloody diarrhea. --Acute hypoxemic respiratory failure provide oxygen while she is hypoxic if she improves she might need to go home and that --Extensive DVT and PE could be provoked by her autoimmune disease of Crohn's disease which is in relapse. --Had long discussion with the patient and vascular surgery about the plan the plan is to go to interventional radiology for EKOS system. --Patient will have urine pregnancy test checked given her age and radiology exposure. --In the future patient is to consult with OB/GYN about the IUD specific time that she has if it increases the risk of DVTs after the acute phase is resolved.   Pulmonary and [place] Pager: [phone]  12/03/2017, 10:34 AM

## 2017-12-04 ENCOUNTER — Encounter (HOSPITAL_COMMUNITY): Payer: Self-pay | Admitting: Interventional Radiology

## 2017-12-04 ENCOUNTER — Inpatient Hospital Stay (HOSPITAL_COMMUNITY): Payer: 59

## 2017-12-04 DIAGNOSIS — J9601 Acute respiratory failure with hypoxia: Secondary | ICD-10-CM

## 2017-12-04 DIAGNOSIS — I82621 Acute embolism and thrombosis of deep veins of right upper extremity: Secondary | ICD-10-CM

## 2017-12-04 HISTORY — PX: IR IVC FILTER PLMT / S&I /IMG GUID/MOD SED: IMG701

## 2017-12-04 HISTORY — PX: IR THROMB F/U EVAL ART/VEN FINAL DAY (MS): IMG5379

## 2017-12-04 LAB — GLUCOSE, CAPILLARY
Glucose-Capillary: 69 mg/dL (ref 65–99)
Glucose-Capillary: 72 mg/dL (ref 65–99)

## 2017-12-04 LAB — HEPARIN LEVEL (UNFRACTIONATED)
HEPARIN UNFRACTIONATED: 0.46 [IU]/mL (ref 0.30–0.70)
HEPARIN UNFRACTIONATED: 0.51 [IU]/mL (ref 0.30–0.70)
HEPARIN UNFRACTIONATED: 0.53 [IU]/mL (ref 0.30–0.70)
Heparin Unfractionated: 0.42 IU/mL (ref 0.30–0.70)

## 2017-12-04 LAB — CBC
HEMATOCRIT: 23.2 % — AB (ref 36.0–46.0)
HEMATOCRIT: 23.8 % — AB (ref 36.0–46.0)
HEMATOCRIT: 25 % — AB (ref 36.0–46.0)
HEMOGLOBIN: 7.5 g/dL — AB (ref 12.0–15.0)
HEMOGLOBIN: 7.7 g/dL — AB (ref 12.0–15.0)
HEMOGLOBIN: 8.1 g/dL — AB (ref 12.0–15.0)
MCH: 30.7 pg (ref 26.0–34.0)
MCH: 30.6 pg (ref 26.0–34.0)
MCH: 30.9 pg (ref 26.0–34.0)
MCHC: 32.4 g/dL (ref 30.0–36.0)
MCHC: 32.3 g/dL (ref 30.0–36.0)
MCHC: 32.4 g/dL (ref 30.0–36.0)
MCV: 94.8 fL (ref 78.0–100.0)
MCV: 94.7 fL (ref 78.0–100.0)
MCV: 95.4 fL (ref 78.0–100.0)
Platelets: 281 10*3/uL (ref 150–400)
Platelets: 269 10*3/uL (ref 150–400)
Platelets: 285 10*3/uL (ref 150–400)
RBC: 2.45 MIL/uL — AB (ref 3.87–5.11)
RBC: 2.51 MIL/uL — AB (ref 3.87–5.11)
RBC: 2.62 MIL/uL — AB (ref 3.87–5.11)
RDW: 15.8 % — ABNORMAL HIGH (ref 11.5–15.5)
RDW: 15.9 % — ABNORMAL HIGH (ref 11.5–15.5)
RDW: 16.1 % — ABNORMAL HIGH (ref 11.5–15.5)
WBC: 5.4 10*3/uL (ref 4.0–10.5)
WBC: 6 10*3/uL (ref 4.0–10.5)
WBC: 7.3 10*3/uL (ref 4.0–10.5)

## 2017-12-04 LAB — BASIC METABOLIC PANEL
ANION GAP: 12 (ref 5–15)
BUN: 5 mg/dL — ABNORMAL LOW (ref 6–20)
CALCIUM: 7.8 mg/dL — AB (ref 8.9–10.3)
CO2: 23 mmol/L (ref 22–32)
Chloride: 106 mmol/L (ref 101–111)
Creatinine, Ser: 0.86 mg/dL (ref 0.44–1.00)
GFR calc non Af Amer: >60 mL/min (ref >60)
Glucose, Bld: 93 mg/dL (ref 65–99)
POTASSIUM: 3.4 mmol/L — AB (ref 3.5–5.1)
Sodium: 141 mmol/L (ref 135–145)

## 2017-12-04 LAB — PREGNANCY, URINE: Preg Test, Ur: NEGATIVE

## 2017-12-04 LAB — FIBRINOGEN
Fibrinogen: 528 mg/dL — ABNORMAL HIGH (ref 210–475)
Fibrinogen: 534 mg/dL — ABNORMAL HIGH (ref 210–475)
Fibrinogen: 560 mg/dL — ABNORMAL HIGH (ref 210–475)

## 2017-12-04 MED ORDER — HYDROMORPHONE HCL 1 MG/ML IJ SOLN
INTRAMUSCULAR | Status: AC | PRN
  Administered 2017-12-04 (×2): 0.5 mg via INTRAVENOUS

## 2017-12-04 MED ORDER — IOPAMIDOL (ISOVUE-300) INJECTION 61%
INTRAVENOUS | Status: AC
  Administered 2017-12-04: 20 mL
  Filled 2017-12-04: qty 50

## 2017-12-04 MED ORDER — ALBUTEROL SULFATE (2.5 MG/3ML) 0.083% IN NEBU
2.5000 mg | INHALATION_SOLUTION | RESPIRATORY_TRACT | Status: DC | PRN
  Administered 2017-12-04 – 2017-12-05 (×3): 2.5 mg via RESPIRATORY_TRACT
  Filled 2017-12-04 (×3): qty 3

## 2017-12-04 MED ORDER — MIDAZOLAM HCL 2 MG/2ML IJ SOLN
INTRAMUSCULAR | Status: AC
  Filled 2017-12-04: qty 6

## 2017-12-04 MED ORDER — FENTANYL CITRATE (PF) 100 MCG/2ML IJ SOLN
INTRAMUSCULAR | Status: AC | PRN
  Administered 2017-12-04 (×2): 25 ug via INTRAVENOUS

## 2017-12-04 MED ORDER — LIDOCAINE HCL 1 % IJ SOLN
INTRAMUSCULAR | Status: AC
  Filled 2017-12-04: qty 20

## 2017-12-04 MED ORDER — MIDAZOLAM HCL 2 MG/2ML IJ SOLN
INTRAMUSCULAR | Status: AC | PRN
  Administered 2017-12-04 (×2): 1 mg via INTRAVENOUS

## 2017-12-04 MED ORDER — LIDOCAINE HCL 1 % IJ SOLN
INTRAMUSCULAR | Status: AC | PRN
  Administered 2017-12-04: 10 mL

## 2017-12-04 MED ORDER — HYDROMORPHONE HCL 1 MG/ML IJ SOLN
INTRAMUSCULAR | Status: AC
  Filled 2017-12-04: qty 0.5

## 2017-12-04 MED ORDER — IOPAMIDOL (ISOVUE-300) INJECTION 61%
INTRAVENOUS | Status: AC
  Administered 2017-12-04: 30 mL
  Filled 2017-12-04: qty 100

## 2017-12-04 MED ORDER — HYDROMORPHONE HCL 1 MG/ML IJ SOLN
1.0000 mg | INTRAMUSCULAR | Status: DC | PRN
  Administered 2017-12-04 – 2017-12-05 (×3): 2 mg via INTRAVENOUS
  Administered 2017-12-06: 1 mg via INTRAVENOUS
  Filled 2017-12-04 (×2): qty 2
  Filled 2017-12-04: qty 1
  Filled 2017-12-04: qty 2

## 2017-12-04 MED ORDER — FENTANYL CITRATE (PF) 100 MCG/2ML IJ SOLN
INTRAMUSCULAR | Status: AC
  Filled 2017-12-04: qty 4

## 2017-12-04 NOTE — Sedation Documentation (Signed)
6 and 12 FR sheaths removed from L IJ by [facility], RTR. Hemostasis achieved with manual pressure. Site unremarkable. Dressed with gauze/tegaderm drsg, CDI.

## 2017-12-04 NOTE — Progress Notes (Signed)
Pt left 2MW headed to IR at 0924. Report given to [employer] .

## 2017-12-04 NOTE — Progress Notes (Addendum)
ANTICOAGULATION CONSULT NOTE - Follow Up Consult  Pharmacy Consult for Heparin  Indication: pulmonary embolus and DVT, undergoing EKOS  Allergies  Allergen Reactions  . Ibuprofen Other (See Comments)    Avoids due to Crohns disease  . Morphine And Related Itching    itchy  . Prednisone Other (See Comments)    "crazy", hallucinations  . Adhesive [Tape] Rash    Rash with electrodes    Patient Measurements: Height: 5' 4"  (162.6 cm) Weight: 220 lb (99.8 kg) IBW/kg (Calculated) : 54.7  Vital Signs: Temp: 97.6 F (36.4 C) (02/02 2320) Temp Source: Oral (02/02 2320) BP: 124/87 (02/03 0000) Pulse Rate: 95 (02/03 0000)  Labs: Recent Labs    12/01/17 1025  12/02/17 0244 12/03/17 0623 12/03/17 1703 12/04/17 0053  HGB  --   --  8.1* 7.9* 7.8* 7.7*  HCT  --   --  25.5* 24.2* 23.7* 23.8*  PLT  --   --  183 260 302 281  HEPARINUNFRC  --    < >  --  0.49 0.34 0.53  CREATININE 0.93  --  0.87  --   --   --    < > = values in this interval not displayed.    Estimated Creatinine Clearance: 99.6 mL/min (by C-G formula based on SCr of 0.87 mg/dL).   Assessment: 42 y/o F with DVT/PE, currently on heparin at 2000 units/hr, undergoing EKOS, heparin level is therapeutic at 0.53  Goal of Therapy:  Heparin level 0.3-0.7 units/ml Monitor platelets by anticoagulation protocol: Yes   Plan:  Cont heparin at 2000 units/hr q6h heparin levels while on EKOS  Narda Bonds 12/04/2017,2:00 AM   ==================== Addendum 6:27 AM Heparin level remains therapeutic at 0.51 -Cont heparin 2000 units/hr -Cont q6h heparin levels while on [place], PharmD, BCPS Clinical Pharmacist Phone: [phone] ====================

## 2017-12-04 NOTE — Plan of Care (Addendum)
Pain medication changed from fent to dilaudid IVP. Guided imaging and meditation technique discussed. Pt reposition and ice packs applied.

## 2017-12-04 NOTE — Procedures (Signed)
Pre procedural Dx: DVT and Pulmonary embolism Post Procedural Dx: Same  Successful completion of bilateral US assisted pulmonary arterial thrombolysis with significant reduction in main PA pressure measurements as follows: Pre procedural PA pressure: 46/19 (mean - 39) Post procedural PA pressure: 47/12 (mean - 29)  Successful placement of an infrarenal IVC filter via the R IJ.  EBL: Trace  Complications: None immediate  Ronny Bacon, MD Pager #: [phone]

## 2017-12-04 NOTE — Progress Notes (Addendum)
ANTICOAGULATION CONSULT NOTE - Follow Up Consult  Pharmacy Consult for Heparin  Indication: pulmonary embolus and DVT  Allergies  Allergen Reactions  . Ibuprofen Other (See Comments)    Avoids due to Crohns disease  . Morphine And Related Itching    itchy  . Prednisone Other (See Comments)    "crazy", hallucinations  . Adhesive [Tape] Rash    Rash with electrodes    Patient Measurements: Height: 5' 4"  (162.6 cm) Weight: 220 lb (99.8 kg) IBW/kg (Calculated) : 54.7  Vital Signs: Temp: 97.2 F (36.2 C) (02/03 1235) Temp Source: Oral (02/03 1235) BP: 132/98 (02/03 1200) Pulse Rate: 98 (02/03 1200)  Labs: Recent Labs    12/02/17 0244  12/04/17 0053 12/04/17 0529 12/04/17 1131  HGB 8.1*   < > 7.7* 7.5* 8.1*  HCT 25.5*   < > 23.8* 23.2* 25.0*  PLT 183   < > 281 269 285  HEPARINUNFRC  --    < > 0.53 0.51 0.42  CREATININE 0.87  --   --  0.86  --    < > = values in this interval not displayed.    Estimated Creatinine Clearance: 100.8 mL/min (by C-G formula based on SCr of 0.86 mg/dL).   Assessment: 42 y/o F with DVT/PE, currently on heparin at 2000 units/hr. Patient underwent EKOS procedure with TPA stopped around 0400 this AM and sheaths pulled at 1030. Heparin remains therapeutic at 0.42. CBC is stable and no signs of bleeding noted.   Goal of Therapy:  Heparin level 0.3-0.7 units/ml Monitor platelets by anticoagulation protocol: Yes   Plan:  Cont heparin at 2000 units/hr Will check 1 more 6 hour HL post-EKOS Then Daily heparin level/CBC  Jimmy Footman, PharmD, BCPS PGY2 Infectious Diseases Pharmacy Resident Pager: [phone]  12/04/2017,1:25 PM   Addendum: Heparin level remains therapeutic at 0.46 on 2000 units/hr s/p EKOS. Will re-check level with morning labs  Jimmy Footman, PharmD, [place] PGY2 Infectious Diseases Pharmacy Resident Pager: [phone]

## 2017-12-04 NOTE — Progress Notes (Signed)
Name: Lindsay Mccarthy MRN: [id number] DOB: [birth year]    ADMISSION DATE:  11/27/2017 CONSULTATION DATE: December 03, 2017  REFERRING MD : Vascular surgery CHIEF COMPLAINT: Right lower extremity pain shortness of breath  BRIEF PATIENT DESCRIPTION: Patient is 42 year old Caucasian female with extensive DVT and PE she had thrombectomy mechanical on Tuesday and Wednesday with recurrence of more PE clot load with extensive right lower extremity DVT  SIGNIFICANT EVENTS  Echo with McConnell sign  STUDIES:  CT with more clot burden the RV dilatation RV to LV ratio is one-point more than 1.5   HISTORY OF PRESENT ILLNESS: This is a 42 year old Caucasian female with history of Crohn's disease on biological therapy and Imuran and relapse who presents to the hospital few days history of right lower extremity swelling pain shortness of breath heaviness in her chest she was found to have PE and extensive DVT on Wednesday and Tuesday of last week she had mechanical thrombectomy repeat CAT scan was done yesterday showed more clot burden with RV dilatation McConnell sign  RV to LV ratio more than 1.5 patient still having heavy breathing hypoxic when she is not on oxygen tachypneic and tachycardic she cannot ambulate without feeling very short of breath patient had extensive history of Crohn's disease about a month ago she had bloody stools with her diarrhea.  She had been working as an IT person and has not had any evidence with with provoked PE she is considering herself has not a sedentary lifestyle where she moves.  She had no recent orthopedic surgery her contraceptive Mirena implant has been there for a long time.  Patient has no history of recurrent DVTs or family history of hypercoagulable state she does not smoke she has IUD  SUBJECTIVE / Interval events:    VITAL SIGNS: Temp:  [97.2 F (36.2 C)-98.4 F (36.9 C)] 97.2 F (36.2 C) (02/03 1235) Pulse Rate:  [85-113] 98 (02/03 1200) Resp:   [11-26] 16 (02/03 1200) BP: (104-148)/(62-100) 132/98 (02/03 1200) SpO2:  [84 %-100 %] 94 % (02/03 1200)  PHYSICAL EXAMINATION: General: Obese woman in no distress.  Neuro: awake, interacting appropriately, non-focal HEENT:  Moist MM Cardiovascular: regular, no M Lungs:  Clear bilaterally Abdomen:  Soft, benign Skin:  No rash    Recent Labs  Lab 12/01/17 1025 12/02/17 0244 12/04/17 0529  NA 139 139 141  K 3.3* 3.2* 3.4*  CL 109 108 106  CO2 19* 21* 23  BUN 6 5* 5*  CREATININE 0.93 0.87 0.86  GLUCOSE 109* 93 93   Recent Labs  Lab 12/03/17 1703 12/04/17 0053 12/04/17 0529  HGB 7.8* 7.7* 7.5*  HCT 23.7* 23.8* 23.2*  WBC 4.8 6.0 5.4  PLT 302 281 269   Ir Angiogram Pulmonary Bilateral Selective  Result Date: 12/03/2017 INDICATION: History of lower extremity DVT post lower extremity catheter directed thrombolysis, now with sub massive pulmonary embolism and CT/echo findings worrisome for right-sided heart failure. As such, request made for bilateral pulmonary arterial ultrasound assisted catheter directed thrombolysis as well as potential placement of an IVC filter. EXAM: 1. ULTRASOUND GUIDANCE FOR VENOUS ACCESS X2 2. BILATERAL PULMONARY ARTERIOGRAPHY 3. FLUOROSCOPIC GUIDED PLACEMENT OF BILATERAL PULMONARY ARTERIAL LYTIC INFUSION CATHETERS COMPARISON:  Chest CTA - 12/02/2017 MEDICATIONS: None ANESTHESIA/SEDATION: Moderate (conscious) sedation was employed during this procedure. A total of Versed 4 mg, Dilaudid 0.5 mg and Fentanyl 150 mcg was administered intravenously. Moderate Sedation Time: 39 minutes. The patient's level of consciousness and vital signs were monitored continuously by  radiology nursing throughout the procedure under my direct supervision. CONTRAST:  15 cc Isovue 300 FLUOROSCOPY TIME:  4 minutes 54 seconds (308 mGy) COMPLICATIONS: None immediate. TECHNIQUE: Informed written consent was obtained from the patient after a discussion of the risks, benefits and  alternatives to treatment. Questions regarding the procedure were encouraged and answered. A timeout was performed prior to the initiation of the procedure. Given the presence of lower extremity DVT, the decision was made to perform the procedure via right internal jugular approach. As such, the right neck was prepped and draped in the usual sterile fashion, and a sterile drape was applied covering the operative field. Maximum barrier sterile technique with sterile gowns and gloves were used for the procedure. A timeout was performed prior to the initiation of the procedure. Local anesthesia was provided with 1% lidocaine. Under direct ultrasound guidance, the right internal jugular vein was accessed with a micro puncture sheath ultimately allowing placement of a 6 French vascular sheath. Slightly cranial to this initial access, the right internal jugular vein was again accessed with a micropuncture sheath ultimately allowing placement of an additional 6 French vascular sheath. With the use of a Bentson wire, a C2 catheter was advanced into the main pulmonary artery and a limited pulmonary arteriogram was performed with a hand injection. Pressure measurements were then obtained from the level of the main pulmonary artery. With use of a Bentson wire, the C2 catheter was advanced into the left pulmonary artery. Limited contrast injection confirmed appropriate positioning. Over an exchange length Rosen wire, the C2 catheter was exchanged for a 105/18 cm multi side-hole EKOS ultrasound assisted infusion catheter. Next, a Bentson wire was utilized to advance a C2 catheter into the right pulmonary artery. Limited contrast injection confirmed appropriate positioning. Over an exchange length Rosen wire, the C2 catheter was exchanged for a 105/12 cm multi side-hole EKOS ultrasound assisted infusion catheter. A postprocedural fluoroscopic image was obtained of the check demonstrating final catheter positioning. Both vascular  sheath were secured at the right neck with an interrupted 0 Prolene suture. The external catheter tubing was secured at the right chest and the lytic therapy was initiated. The patient tolerated the procedure well without immediate postprocedural complication. FINDINGS: Limited pulmonary arteriogram was performed for location documentation purposes. Acquired pressure measurements: Main pulmonary artery - 46/19 (mean - 39) (normal: < 25/10) Following the procedure, both ultrasound assisted infusion catheter tips terminate within the distal aspects of the bilateral lower lobe sub segmental pulmonary arteries. IMPRESSION: 1. Successful fluoroscopic guided initiation of bilateral ultrasound assisted catheter directed pulmonary arterial lysis for sub massive pulmonary embolism and right-sided heart strain. 2. Markedly elevated pressure measurements within the main pulmonary artery compatible with critical pulmonary arterial hypertension. PLAN: The patient will return to the interventional radiology suite following 12 hours bilateral pulmonary arterial catheter directed thrombolysis for repeat pressure measurements as well as potential placement of an IVC filter. Electronically Signed   By: Sandi Mariscal M.D.   On: 12/03/2017 16:10   Ir Angiogram Selective Each Additional Vessel  Result Date: 12/03/2017 INDICATION: History of lower extremity DVT post lower extremity catheter directed thrombolysis, now with sub massive pulmonary embolism and CT/echo findings worrisome for right-sided heart failure. As such, request made for bilateral pulmonary arterial ultrasound assisted catheter directed thrombolysis as well as potential placement of an IVC filter. EXAM: 1. ULTRASOUND GUIDANCE FOR VENOUS ACCESS X2 2. BILATERAL PULMONARY ARTERIOGRAPHY 3. FLUOROSCOPIC GUIDED PLACEMENT OF BILATERAL PULMONARY ARTERIAL LYTIC INFUSION CATHETERS COMPARISON:  Chest CTA -  12/02/2017 MEDICATIONS: None ANESTHESIA/SEDATION: Moderate (conscious)  sedation was employed during this procedure. A total of Versed 4 mg, Dilaudid 0.5 mg and Fentanyl 150 mcg was administered intravenously. Moderate Sedation Time: 39 minutes. The patient's level of consciousness and vital signs were monitored continuously by radiology nursing throughout the procedure under my direct supervision. CONTRAST:  15 cc Isovue 300 FLUOROSCOPY TIME:  4 minutes 54 seconds (119 mGy) COMPLICATIONS: None immediate. TECHNIQUE: Informed written consent was obtained from the patient after a discussion of the risks, benefits and alternatives to treatment. Questions regarding the procedure were encouraged and answered. A timeout was performed prior to the initiation of the procedure. Given the presence of lower extremity DVT, the decision was made to perform the procedure via right internal jugular approach. As such, the right neck was prepped and draped in the usual sterile fashion, and a sterile drape was applied covering the operative field. Maximum barrier sterile technique with sterile gowns and gloves were used for the procedure. A timeout was performed prior to the initiation of the procedure. Local anesthesia was provided with 1% lidocaine. Under direct ultrasound guidance, the right internal jugular vein was accessed with a micro puncture sheath ultimately allowing placement of a 6 French vascular sheath. Slightly cranial to this initial access, the right internal jugular vein was again accessed with a micropuncture sheath ultimately allowing placement of an additional 6 French vascular sheath. With the use of a Bentson wire, a C2 catheter was advanced into the main pulmonary artery and a limited pulmonary arteriogram was performed with a hand injection. Pressure measurements were then obtained from the level of the main pulmonary artery. With use of a Bentson wire, the C2 catheter was advanced into the left pulmonary artery. Limited contrast injection confirmed appropriate positioning. Over  an exchange length Rosen wire, the C2 catheter was exchanged for a 105/18 cm multi side-hole EKOS ultrasound assisted infusion catheter. Next, a Bentson wire was utilized to advance a C2 catheter into the right pulmonary artery. Limited contrast injection confirmed appropriate positioning. Over an exchange length Rosen wire, the C2 catheter was exchanged for a 105/12 cm multi side-hole EKOS ultrasound assisted infusion catheter. A postprocedural fluoroscopic image was obtained of the check demonstrating final catheter positioning. Both vascular sheath were secured at the right neck with an interrupted 0 Prolene suture. The external catheter tubing was secured at the right chest and the lytic therapy was initiated. The patient tolerated the procedure well without immediate postprocedural complication. FINDINGS: Limited pulmonary arteriogram was performed for location documentation purposes. Acquired pressure measurements: Main pulmonary artery - 46/19 (mean - 39) (normal: < 25/10) Following the procedure, both ultrasound assisted infusion catheter tips terminate within the distal aspects of the bilateral lower lobe sub segmental pulmonary arteries. IMPRESSION: 1. Successful fluoroscopic guided initiation of bilateral ultrasound assisted catheter directed pulmonary arterial lysis for sub massive pulmonary embolism and right-sided heart strain. 2. Markedly elevated pressure measurements within the main pulmonary artery compatible with critical pulmonary arterial hypertension. PLAN: The patient will return to the interventional radiology suite following 12 hours bilateral pulmonary arterial catheter directed thrombolysis for repeat pressure measurements as well as potential placement of an IVC filter. Electronically Signed   By: Sandi Mariscal M.D.   On: 12/03/2017 16:10   Ir Angiogram Selective Each Additional Vessel  Result Date: 12/03/2017 INDICATION: History of lower extremity DVT post lower extremity catheter  directed thrombolysis, now with sub massive pulmonary embolism and CT/echo findings worrisome for right-sided heart failure. As such, request  made for bilateral pulmonary arterial ultrasound assisted catheter directed thrombolysis as well as potential placement of an IVC filter. EXAM: 1. ULTRASOUND GUIDANCE FOR VENOUS ACCESS X2 2. BILATERAL PULMONARY ARTERIOGRAPHY 3. FLUOROSCOPIC GUIDED PLACEMENT OF BILATERAL PULMONARY ARTERIAL LYTIC INFUSION CATHETERS COMPARISON:  Chest CTA - 12/02/2017 MEDICATIONS: None ANESTHESIA/SEDATION: Moderate (conscious) sedation was employed during this procedure. A total of Versed 4 mg, Dilaudid 0.5 mg and Fentanyl 150 mcg was administered intravenously. Moderate Sedation Time: 39 minutes. The patient's level of consciousness and vital signs were monitored continuously by radiology nursing throughout the procedure under my direct supervision. CONTRAST:  15 cc Isovue 300 FLUOROSCOPY TIME:  4 minutes 54 seconds (884 mGy) COMPLICATIONS: None immediate. TECHNIQUE: Informed written consent was obtained from the patient after a discussion of the risks, benefits and alternatives to treatment. Questions regarding the procedure were encouraged and answered. A timeout was performed prior to the initiation of the procedure. Given the presence of lower extremity DVT, the decision was made to perform the procedure via right internal jugular approach. As such, the right neck was prepped and draped in the usual sterile fashion, and a sterile drape was applied covering the operative field. Maximum barrier sterile technique with sterile gowns and gloves were used for the procedure. A timeout was performed prior to the initiation of the procedure. Local anesthesia was provided with 1% lidocaine. Under direct ultrasound guidance, the right internal jugular vein was accessed with a micro puncture sheath ultimately allowing placement of a 6 French vascular sheath. Slightly cranial to this initial access, the  right internal jugular vein was again accessed with a micropuncture sheath ultimately allowing placement of an additional 6 French vascular sheath. With the use of a Bentson wire, a C2 catheter was advanced into the main pulmonary artery and a limited pulmonary arteriogram was performed with a hand injection. Pressure measurements were then obtained from the level of the main pulmonary artery. With use of a Bentson wire, the C2 catheter was advanced into the left pulmonary artery. Limited contrast injection confirmed appropriate positioning. Over an exchange length Rosen wire, the C2 catheter was exchanged for a 105/18 cm multi side-hole EKOS ultrasound assisted infusion catheter. Next, a Bentson wire was utilized to advance a C2 catheter into the right pulmonary artery. Limited contrast injection confirmed appropriate positioning. Over an exchange length Rosen wire, the C2 catheter was exchanged for a 105/12 cm multi side-hole EKOS ultrasound assisted infusion catheter. A postprocedural fluoroscopic image was obtained of the check demonstrating final catheter positioning. Both vascular sheath were secured at the right neck with an interrupted 0 Prolene suture. The external catheter tubing was secured at the right chest and the lytic therapy was initiated. The patient tolerated the procedure well without immediate postprocedural complication. FINDINGS: Limited pulmonary arteriogram was performed for location documentation purposes. Acquired pressure measurements: Main pulmonary artery - 46/19 (mean - 39) (normal: < 25/10) Following the procedure, both ultrasound assisted infusion catheter tips terminate within the distal aspects of the bilateral lower lobe sub segmental pulmonary arteries. IMPRESSION: 1. Successful fluoroscopic guided initiation of bilateral ultrasound assisted catheter directed pulmonary arterial lysis for sub massive pulmonary embolism and right-sided heart strain. 2. Markedly elevated pressure  measurements within the main pulmonary artery compatible with critical pulmonary arterial hypertension. PLAN: The patient will return to the interventional radiology suite following 12 hours bilateral pulmonary arterial catheter directed thrombolysis for repeat pressure measurements as well as potential placement of an IVC filter. Electronically Signed   By: Eldridge Abrahams.D.  On: 12/03/2017 16:10   Ir Ivc Filter Plmt / S&i /img Guid/mod Sed  Result Date: 12/04/2017 INDICATION: History of right lower extremity DVT, post lower extremity catheter directed thrombolysis with postprocedural development of sub massive pulmonary embolism and CT/echo findings worrisome for right-sided heart strain. Post initiation of bilateral pulmonary arterial ultrasound assisted catheter directed thrombolysis on 12/03/2017 Request has also been made for placement of an IVC filter for temporary caval interruption purposes given presence of persistent DVT. EXAM: 1. IR PULMONARY ARTERIAL LYSIS, SUBSEQUENT; FLUOROSCOPIC GUIDED PULMONARY ARTERIAL PRESSURE MEASUREMENT ACQUISITION 2. IVC CATHETERIZATION AND VENOGRAM 3. IVC FILTER INSERTION COMPARISON:  Initiation of bilateral ultrasound assisted pulmonary arterial thrombolysis - 12/03/2017; chest CT - 12/02/2017; CT abdomen and pelvis - 12/02/2017 MEDICATIONS: None. ANESTHESIA/SEDATION: Dilaudid 2 mg IV; Versed 1 mg IV Sedation Time: 23 minutes; The patient was continuously monitored during the procedure by the interventional radiology nurse under my direct supervision. CONTRAST:  50 cc Isovue-300 FLUOROSCOPY TIME:  2 Minutes 30 seconds (657 mGy) COMPLICATIONS: None immediate PROCEDURE: Patient was placed supine on the fluoroscopy table. Preprocedural spot fluoroscopic image was obtained of the chest and existing bilateral pulmonary arterial lytic infusion catheters. The introducer wire from the left pulmonary arterial infusion catheter was removed and the tip of the infusion catheter was  retracted to the level of the main pulmonary artery where pressure measurements were obtained. At this time, both infusion catheters removed. The external portion of the adjacent right internal jugular vein vascular sheath as well as the surrounding skin were prepped and draped in usual sterile fashion. Maximal barrier sterile technique utilized including caps, mask, sterile gowns, sterile gloves, large sterile drape, hand hygiene, and Betadine prep. The more caudal vascular sheath was cannulated with a Kumpe the catheter which was utilized to advance a Bentson wire to the level of the IVC. Over the Bentson wire, a pigtail catheter was advanced into the caudal aspect of the abdominal aorta where a inferior vena cavagram was performed. Next, the vascular sheath was exchanged for the IVC filter delivery sheath and inner dilator. Through the delivery sheath, a retrievable Denali IVC filter was deployed below the level of the renal veins and above the IVC bifurcation. Limited post deployment venacavagram was performed. The delivery sheath as well as the adjacent vascular sheath were both removed and hemostasis was obtained with manual compression. A dressing was placed. The patient tolerated the procedure well without immediate post procedural complication. FINDINGS: Preprocedural spot fluoroscopic image demonstrates unchanged positioning of the bilateral pulmonary arterial lytic infusion catheters. Significant reduction in main pulmonary pressure with measurements as follows: Preprocedural pressure measurements - 46/19 (mean - 39) Postprocedural pressure measurements - 47/12 (mean - 29) The IVC is patent. No evidence of thrombus, stenosis, or occlusion. No variant venous anatomy. Successful placement of the IVC filter below the level of the renal veins. IMPRESSION: 1. Technically successful completion of 12 hour bilateral ultrasound assisted pulmonary arterial thrombolysis with reduction in the main PA pressure by 10  mmHg. 2. Successful ultrasound and fluoroscopically guided placement of an infrarenal retrievable IVC filter via right jugular approach. PLAN: This IVC filter is potentially retrievable. The patient will be assessed for filter retrieval by Interventional Radiology in approximately 8-12 weeks. Further recommendations regarding filter retrieval, continued surveillance or declaration of device permanence, will be made at that time. Electronically Signed   By: Sandi Mariscal M.D.   On: 12/04/2017 10:53   Ir US Guide Vasc Access Right  Result Date: 12/03/2017 INDICATION: History of lower  extremity DVT post lower extremity catheter directed thrombolysis, now with sub massive pulmonary embolism and CT/echo findings worrisome for right-sided heart failure. As such, request made for bilateral pulmonary arterial ultrasound assisted catheter directed thrombolysis as well as potential placement of an IVC filter. EXAM: 1. ULTRASOUND GUIDANCE FOR VENOUS ACCESS X2 2. BILATERAL PULMONARY ARTERIOGRAPHY 3. FLUOROSCOPIC GUIDED PLACEMENT OF BILATERAL PULMONARY ARTERIAL LYTIC INFUSION CATHETERS COMPARISON:  Chest CTA - 12/02/2017 MEDICATIONS: None ANESTHESIA/SEDATION: Moderate (conscious) sedation was employed during this procedure. A total of Versed 4 mg, Dilaudid 0.5 mg and Fentanyl 150 mcg was administered intravenously. Moderate Sedation Time: 39 minutes. The patient's level of consciousness and vital signs were monitored continuously by radiology nursing throughout the procedure under my direct supervision. CONTRAST:  15 cc Isovue 300 FLUOROSCOPY TIME:  4 minutes 54 seconds (680 mGy) COMPLICATIONS: None immediate. TECHNIQUE: Informed written consent was obtained from the patient after a discussion of the risks, benefits and alternatives to treatment. Questions regarding the procedure were encouraged and answered. A timeout was performed prior to the initiation of the procedure. Given the presence of lower extremity DVT, the  decision was made to perform the procedure via right internal jugular approach. As such, the right neck was prepped and draped in the usual sterile fashion, and a sterile drape was applied covering the operative field. Maximum barrier sterile technique with sterile gowns and gloves were used for the procedure. A timeout was performed prior to the initiation of the procedure. Local anesthesia was provided with 1% lidocaine. Under direct ultrasound guidance, the right internal jugular vein was accessed with a micro puncture sheath ultimately allowing placement of a 6 French vascular sheath. Slightly cranial to this initial access, the right internal jugular vein was again accessed with a micropuncture sheath ultimately allowing placement of an additional 6 French vascular sheath. With the use of a Bentson wire, a C2 catheter was advanced into the main pulmonary artery and a limited pulmonary arteriogram was performed with a hand injection. Pressure measurements were then obtained from the level of the main pulmonary artery. With use of a Bentson wire, the C2 catheter was advanced into the left pulmonary artery. Limited contrast injection confirmed appropriate positioning. Over an exchange length Rosen wire, the C2 catheter was exchanged for a 105/18 cm multi side-hole EKOS ultrasound assisted infusion catheter. Next, a Bentson wire was utilized to advance a C2 catheter into the right pulmonary artery. Limited contrast injection confirmed appropriate positioning. Over an exchange length Rosen wire, the C2 catheter was exchanged for a 105/12 cm multi side-hole EKOS ultrasound assisted infusion catheter. A postprocedural fluoroscopic image was obtained of the check demonstrating final catheter positioning. Both vascular sheath were secured at the right neck with an interrupted 0 Prolene suture. The external catheter tubing was secured at the right chest and the lytic therapy was initiated. The patient tolerated the  procedure well without immediate postprocedural complication. FINDINGS: Limited pulmonary arteriogram was performed for location documentation purposes. Acquired pressure measurements: Main pulmonary artery - 46/19 (mean - 39) (normal: < 25/10) Following the procedure, both ultrasound assisted infusion catheter tips terminate within the distal aspects of the bilateral lower lobe sub segmental pulmonary arteries. IMPRESSION: 1. Successful fluoroscopic guided initiation of bilateral ultrasound assisted catheter directed pulmonary arterial lysis for sub massive pulmonary embolism and right-sided heart strain. 2. Markedly elevated pressure measurements within the main pulmonary artery compatible with critical pulmonary arterial hypertension. PLAN: The patient will return to the interventional radiology suite following 12 hours bilateral pulmonary arterial  catheter directed thrombolysis for repeat pressure measurements as well as potential placement of an IVC filter. Electronically Signed   By: Sandi Mariscal M.D.   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 As such, request made for bilateral pulmonary arterial ultrasound assisted catheter directed thrombolysis as well as potential placement of an IVC filter. EXAM: 1. ULTRASOUND GUIDANCE FOR VENOUS ACCESS X2 2. BILATERAL PULMONARY ARTERIOGRAPHY 3. FLUOROSCOPIC GUIDED PLACEMENT OF BILATERAL PULMONARY ARTERIAL LYTIC INFUSION CATHETERS COMPARISON:  Chest CTA - 12/02/2017 MEDICATIONS: None ANESTHESIA/SEDATION: Moderate (conscious) sedation was employed during this procedure. A total of Versed 4 mg, Dilaudid 0.5 mg and Fentanyl 150 mcg was administered intravenously. Moderate Sedation Time: 39 minutes. The patient's level of  consciousness and vital signs were monitored continuously by radiology nursing throughout the procedure under my direct supervision. CONTRAST:  15 cc Isovue 300 FLUOROSCOPY TIME:  4 minutes 54 seconds (562 mGy) COMPLICATIONS: None immediate. TECHNIQUE: Informed written consent was obtained from the patient after a discussion of the risks, benefits and alternatives to treatment. Questions regarding the procedure were encouraged and answered. A timeout was performed prior to the initiation of the procedure. Given the presence of lower extremity DVT, the decision was made to perform the procedure via right internal jugular approach. As such, the right neck was prepped and draped in the usual sterile fashion, and a sterile drape was applied covering the operative field. Maximum barrier sterile technique with sterile gowns and gloves were used for the procedure. A timeout was performed prior to the initiation of the procedure. Local anesthesia was provided with 1% lidocaine. Under direct ultrasound guidance, the right internal jugular vein was accessed with a micro puncture sheath ultimately allowing placement of a 6 French vascular sheath. Slightly cranial to this initial access, the right internal jugular vein was again accessed with a micropuncture sheath ultimately allowing placement of an additional 6 French vascular sheath. With the use of a Bentson wire, a C2 catheter was advanced into the main pulmonary artery and a limited pulmonary arteriogram was performed with a hand injection. Pressure measurements were then obtained from the level of the main pulmonary artery. With use of a Bentson wire, the C2 catheter was advanced into the left pulmonary artery. Limited contrast injection confirmed appropriate positioning. Over an exchange length Rosen wire, the C2 catheter was exchanged for a 105/18 cm multi side-hole EKOS ultrasound assisted infusion catheter. Next, a Bentson wire was utilized to advance a C2 catheter  into the right pulmonary artery. Limited contrast injection confirmed appropriate positioning. Over an exchange length Rosen wire, the C2 catheter was exchanged for a 105/12 cm multi side-hole EKOS ultrasound assisted infusion catheter. A postprocedural fluoroscopic image was obtained of the check demonstrating final catheter positioning. Both vascular sheath were secured at the right neck with an interrupted 0 Prolene suture. The external catheter tubing was secured at the right chest and the lytic therapy was initiated. The patient tolerated the procedure well without immediate postprocedural complication. FINDINGS: Limited pulmonary arteriogram was performed for location documentation purposes. Acquired pressure measurements: Main pulmonary artery - 46/19 (mean - 39) (normal: < 25/10) Following the procedure, both ultrasound assisted infusion catheter tips terminate within the distal aspects of the bilateral lower lobe sub segmental pulmonary arteries. IMPRESSION: 1. Successful fluoroscopic guided initiation of bilateral ultrasound assisted  catheter directed pulmonary arterial lysis for sub massive pulmonary embolism and right-sided heart strain. 2. Markedly elevated pressure measurements within the main pulmonary artery compatible with critical pulmonary arterial hypertension. PLAN: The patient will return to the interventional radiology suite following 12 hours bilateral pulmonary arterial catheter directed thrombolysis for repeat pressure measurements as well as potential placement of an IVC filter. Electronically Signed   By: Sandi Mariscal M.D.   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 As such, request made for bilateral pulmonary arterial ultrasound assisted  catheter directed thrombolysis as well as potential placement of an IVC filter. EXAM: 1. ULTRASOUND GUIDANCE FOR VENOUS ACCESS X2 2. BILATERAL PULMONARY ARTERIOGRAPHY 3. FLUOROSCOPIC GUIDED PLACEMENT OF BILATERAL PULMONARY ARTERIAL LYTIC INFUSION CATHETERS COMPARISON:  Chest CTA - 12/02/2017 MEDICATIONS: None ANESTHESIA/SEDATION: Moderate (conscious) sedation was employed during this procedure. A total of Versed 4 mg, Dilaudid 0.5 mg and Fentanyl 150 mcg was administered intravenously. Moderate Sedation Time: 39 minutes. The patient's level of consciousness and vital signs were monitored continuously by radiology nursing throughout the procedure under my direct supervision. CONTRAST:  15 cc Isovue 300 FLUOROSCOPY TIME:  4 minutes 54 seconds (245 mGy) COMPLICATIONS: None immediate. TECHNIQUE: Informed written consent was obtained from the patient after a discussion of the risks, benefits and alternatives to treatment. Questions regarding the procedure were encouraged and answered. A timeout was performed prior to the initiation of the procedure. Given the presence of lower extremity DVT, the decision was made to perform the procedure via right internal jugular approach. As such, the right neck was prepped and draped in the usual sterile fashion, and a sterile drape was applied covering the operative field. Maximum barrier sterile technique with sterile gowns and gloves were used for the procedure. A timeout was performed prior to the initiation of the procedure. Local anesthesia was provided with 1% lidocaine. Under direct ultrasound guidance, the right internal jugular vein was accessed with a micro puncture sheath ultimately allowing placement of a 6 French vascular sheath. Slightly cranial to this initial access, the right internal jugular vein was again accessed with a micropuncture sheath ultimately allowing placement of an additional 6 French vascular sheath. With the use of a Bentson wire, a C2 catheter was  advanced into the main pulmonary artery and a limited pulmonary arteriogram was performed with a hand injection. Pressure measurements were then obtained from the level of the main pulmonary artery. With use of a Bentson wire, the C2 catheter was advanced into the left pulmonary artery. Limited contrast injection confirmed appropriate positioning. Over an exchange length Rosen wire, the C2 catheter was exchanged for a 105/18 cm multi side-hole EKOS ultrasound assisted infusion catheter. Next, a Bentson wire was utilized to advance a C2 catheter into the right pulmonary artery. Limited contrast injection confirmed appropriate positioning. Over an exchange length Rosen wire, the C2 catheter was exchanged for a 105/12 cm multi side-hole EKOS ultrasound assisted infusion catheter. A postprocedural fluoroscopic image was obtained of the check demonstrating final catheter positioning. Both vascular sheath were secured at the right neck with an interrupted 0 Prolene suture. The external catheter tubing was secured at the right chest and the lytic therapy was initiated. The patient tolerated the procedure well without immediate postprocedural complication. FINDINGS: Limited pulmonary arteriogram was performed for location documentation purposes. Acquired  pressure measurements: Main pulmonary artery - 46/19 (mean - 39) (normal: < 25/10) Following the procedure, both ultrasound assisted infusion catheter tips terminate within the distal aspects of the bilateral lower lobe sub segmental pulmonary arteries. IMPRESSION: 1. Successful fluoroscopic guided initiation of bilateral ultrasound assisted catheter directed pulmonary arterial lysis for sub massive pulmonary embolism and right-sided heart strain. 2. Markedly elevated pressure measurements within the main pulmonary artery compatible with critical pulmonary arterial hypertension. PLAN: The patient will return to the interventional radiology suite following 12 hours bilateral  pulmonary arterial catheter directed thrombolysis for repeat pressure measurements as well as potential placement of an IVC filter. Electronically Signed   By: Sandi Mariscal M.D.   On: 12/03/2017 16:10   Ir Jacolyn Reedy F/u Eval Art/ven Final Day (ms)  Result Date: 12/04/2017 INDICATION: History of right lower extremity DVT, post lower extremity catheter directed thrombolysis with postprocedural development of sub massive pulmonary embolism and CT/echo findings worrisome for right-sided heart strain. Post initiation of bilateral pulmonary arterial ultrasound assisted catheter directed thrombolysis on 12/03/2017 Request has also been made for placement of an IVC filter for temporary caval interruption purposes given presence of persistent DVT. EXAM: 1. IR PULMONARY ARTERIAL LYSIS, SUBSEQUENT; FLUOROSCOPIC GUIDED PULMONARY ARTERIAL PRESSURE MEASUREMENT ACQUISITION 2. IVC CATHETERIZATION AND VENOGRAM 3. IVC FILTER INSERTION COMPARISON:  Initiation of bilateral ultrasound assisted pulmonary arterial thrombolysis - 12/03/2017; chest CT - 12/02/2017; CT abdomen and pelvis - 12/02/2017 MEDICATIONS: None. ANESTHESIA/SEDATION: Dilaudid 2 mg IV; Versed 1 mg IV Sedation Time: 23 minutes; The patient was continuously monitored during the procedure by the interventional radiology nurse under my direct supervision. CONTRAST:  50 cc Isovue-300 FLUOROSCOPY TIME:  2 Minutes 30 seconds (941 mGy) COMPLICATIONS: None immediate PROCEDURE: Patient was placed supine on the fluoroscopy table. Preprocedural spot fluoroscopic image was obtained of the chest and existing bilateral pulmonary arterial lytic infusion catheters. The introducer wire from the left pulmonary arterial infusion catheter was removed and the tip of the infusion catheter was retracted to the level of the main pulmonary artery where pressure measurements were obtained. At this time, both infusion catheters removed. The external portion of the adjacent right internal jugular  vein vascular sheath as well as the surrounding skin were prepped and draped in usual sterile fashion. Maximal barrier sterile technique utilized including caps, mask, sterile gowns, sterile gloves, large sterile drape, hand hygiene, and Betadine prep. The more caudal vascular sheath was cannulated with a Kumpe the catheter which was utilized to advance a Bentson wire to the level of the IVC. Over the Bentson wire, a pigtail catheter was advanced into the caudal aspect of the abdominal aorta where a inferior vena cavagram was performed. Next, the vascular sheath was exchanged for the IVC filter delivery sheath and inner dilator. Through the delivery sheath, a retrievable Denali IVC filter was deployed below the level of the renal veins and above the IVC bifurcation. Limited post deployment venacavagram was performed. The delivery sheath as well as the adjacent vascular sheath were both removed and hemostasis was obtained with manual compression. A dressing was placed. The patient tolerated the procedure well without immediate post procedural complication. FINDINGS: Preprocedural spot fluoroscopic image demonstrates unchanged positioning of the bilateral pulmonary arterial lytic infusion catheters. Significant reduction in main pulmonary pressure with measurements as follows: Preprocedural pressure measurements - 46/19 (mean - 39) Postprocedural pressure measurements - 47/12 (mean - 29) The IVC is patent. No evidence of thrombus, stenosis, or occlusion. No variant venous anatomy. Successful placement of the IVC filter  below the level of the renal veins. IMPRESSION: 1. Technically successful completion of 12 hour bilateral ultrasound assisted pulmonary arterial thrombolysis with reduction in the main PA pressure by 10 mmHg. 2. Successful ultrasound and fluoroscopically guided placement of an infrarenal retrievable IVC filter via right jugular approach. PLAN: This IVC filter is potentially retrievable. The patient will  be assessed for filter retrieval by Interventional Radiology in approximately 8-12 weeks. Further recommendations regarding filter retrieval, continued surveillance or declaration of device permanence, will be made at that time. Electronically Signed   By: Sandi Mariscal M.D.   On: 12/04/2017 10:53    ASSESSMENT / PLAN: LE DVT s/p mechanical thrombectomy and lysis Acute submassive PE  Acute respiratory failure  She has now had IVC filter placed, has undergone EKOS for targeted lysis of her PE. Well tolerated and dyspnea improved. She will need a repeat TTE in several months to look at RV fxn. We will wean her O2 as able. Continue heparin gtt for now. Defer to vascular sgy with regard to duration of heparin and when ok to change to PO anticoagulation. Also will need to determine with Vascular when IVC filter can be removed.    Baltazar Apo, MD, PhD 12/04/2017, 1:06 PM [facility] [phone] or if no answer [phone]

## 2017-12-04 NOTE — Progress Notes (Signed)
Subjective: Interval History: none.. Complains of persistent back pain which seems to be related to lying in bed.  Respiratory comfortable.  Objective: Vital signs in last 24 hours: Temp:  [97.4 F (36.3 C)-98.4 F (36.9 C)] 98.3 F (36.8 C) (02/03 0342) Pulse Rate:  [85-113] 97 (02/03 0530) Resp:  [12-37] 19 (02/03 0530) BP: (104-148)/(64-98) 143/91 (02/03 0530) SpO2:  [86 %-100 %] 94 % (02/03 0530)  Intake/Output from previous day: 02/02 0701 - 02/03 0700 In: 3820.7 [P.O.:120; I.V.:3700.7] Out: -  Intake/Output this shift: No intake/output data recorded.  Continued swelling both lower extremities.  Lab Results: Recent Labs    12/04/17 0053 12/04/17 0529  WBC 6.0 5.4  HGB 7.7* 7.5*  HCT 23.8* 23.2*  PLT 281 269   BMET Recent Labs    12/02/17 0244 12/04/17 0529  NA 139 141  K 3.2* 3.4*  CL 108 106  CO2 21* 23  GLUCOSE 93 93  BUN 5* 5*  CREATININE 0.87 0.86  CALCIUM 7.5* 7.8*    Studies/Results: Ct Angio Chest Pe W Or Wo Contrast  Result Date: 12/02/2017 CLINICAL DATA:  High pretest probability of pulmonary emboli. Mid back pain, shortness of breath. EXAM: CT ANGIOGRAPHY CHEST CT ABDOMEN AND PELVIS WITH CONTRAST TECHNIQUE: Multidetector CT imaging of the chest was performed using the standard protocol during bolus administration of intravenous contrast. Multiplanar CT image reconstructions and MIPs were obtained to evaluate the vascular anatomy. Multidetector CT imaging of the abdomen and pelvis was performed using the standard protocol during bolus administration of intravenous contrast. CONTRAST:  178m ISOVUE-370 IOPAMIDOL (ISOVUE-370) INJECTION 76% COMPARISON:  11/27/2017 FINDINGS: CTA CHEST FINDINGS Cardiovascular: Mild right atrial enlargement. Dilated right ventricle, RV/LV ratio 1.5. Large partially occlusive acute pulmonary embolus in the right pulmonary artery extending into the upper and lower lobe segmental branches. 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 Adequate contrast opacification of the thoracic aorta with no evidence of dissection, aneurysm, or stenosis. There is classic 3-vessel brachiocephalic arch anatomy without proximal stenosis. No significant atheromatous irregularity. Mediastinum/Nodes: No enlarged mediastinal, hilar, or axillary lymph nodes. Thyroid gland, trachea, and esophagus demonstrate no significant findings. Lungs/Pleura: Stable presumed bronchocele in the lateral basal segment left lower lobe. New interstitial infiltrate in the left upper lobe suprahilar region. Right lung clear. No pneumothorax. No pleural effusion. Musculoskeletal: No chest wall abnormality. No acute or significant osseous findings. Review of the MIP images confirms the above findings. CT ABDOMEN and PELVIS FINDINGS Hepatobiliary: No focal liver abnormality is seen. No gallstones, gallbladder wall thickening, or biliary dilatation. Pancreas: Unremarkable. No pancreatic ductal dilatation or surrounding inflammatory changes. Spleen: Normal in size without focal abnormality. Adrenals/Urinary Tract: Normal adrenals. 0.8 cm probable cyst, upper pole left kidney. 5.2 cm cyst, lower pole right kidney. No hydronephrosis. Urinary bladder incompletely distended. Stomach/Bowel: Stomach is nondistended. Small bowel decompressed. Anastomotic staple line in the terminal ileum. The colon is nondilated. Vascular/Lymphatic: No significant arterial pathology evident. Partially occlusive DVT in the right femoral, deep femoral, and common femoral veins. There is also some partially occlusive thrombus in the right iliac vein just proximal to its confluence with the cava. IVC appears widely patent. Portal vein patent. No retroperitoneal hemorrhage. Reproductive: Uterus and bilateral adnexa are unremarkable. IUD in expected location. Other: No ascites.   No free air. Musculoskeletal: Edematous/inflammatory changes in the subcutaneous tissues of the visualized proximal right thigh. If regional  bones unremarkable. Review of the MIP images confirms the above findings. IMPRESSION: 1. Positive for central partially occlusive right and segmental left lower lobe pulmonary emboli with CT evidence of right heart strain (RV/LV Ratio = 1.5) consistent with at least submassive (intermediate risk) PE. The presence of right heart strain has been associated with an increased risk of morbidity and mortality. Critical Value/emergent results were called by telephone at the time of interpretation on 12/02/2017 at 11:52 am to Dr. Harold Barban , who verbally acknowledged these results. 2. Residual partially occlusive DVT in the right lower extremity and right iliac vein. No evidence of caval thrombus. Electronically Signed   By: Lucrezia Europe M.D.   On: 12/02/2017 11:52   Ct Angio Chest Pe W And/or Wo Contrast  Result Date: 11/27/2017 CLINICAL DATA:  High pretest probability for pulmonary embolism. One day of leg swelling on the right. History of DVT. EXAM: CT ANGIOGRAPHY CHEST WITH CONTRAST TECHNIQUE: Multidetector CT imaging of the chest was performed using the standard protocol during bolus administration of intravenous contrast. Multiplanar CT image reconstructions and MIPs were obtained to evaluate the vascular anatomy. CONTRAST:  18m ISOVUE-370 IOPAMIDOL (ISOVUE-370) INJECTION 76% COMPARISON:  None. FINDINGS: Cardiovascular: Satisfactory opacification of the pulmonary arteries. There is a linear eccentric filling defect within the left arterial tree at the bifurcation of the lower lobe and lingular branches, a web-like appearance. Filling defect within posterior basal segment branch left lower lobe appears central and is age indeterminate. There is a somewhat eccentric filling defect within right lower lobe segmental branch point on 6:158, age-indeterminate. No indication of  right heart strain. Heart size is normal. No pericardial effusion. Mediastinum/Nodes: Negative Lungs/Pleura: Negative for lung infarct or pulmonary edema. No pneumonia, effusion, or pneumothorax. 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 Follow-up is recommended in this former smoker to ensure that there is not a small occult underlying obstructive lesion. Upper Abdomen: Possible hepatic steatosis, but certainty limited by contrast timing. Musculoskeletal: Negative Critical Value/emergent results were called by telephone at the time of interpretation on 11/27/2017 at 4:50 pm to Dr. PAddison Lank, who verbally acknowledged these results. Review of the MIP images confirms the above findings. IMPRESSION: 1. 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 Additional small segmental pulmonary embolism in the left lower lobe which may be acute. 2. Branching tubular density in the left lower lobe compatible with bronchocele. If no outside comparison follow-up noncontrast chest CT in 3-6 months is recommended to ensure there is no underlying obstructive lesion in this former smoker. Electronically Signed   By: JMonte FantasiaM.D.   On: 11/27/2017 16:50   Ct Abdomen Pelvis W Contrast  Result Date: 12/02/2017 CLINICAL DATA:  High pretest probability of pulmonary emboli. Mid back pain, shortness of breath. EXAM: CT ANGIOGRAPHY CHEST CT ABDOMEN AND PELVIS WITH CONTRAST TECHNIQUE: Multidetector CT imaging of the chest was performed using the standard protocol during bolus administration of intravenous contrast. Multiplanar CT image reconstructions and MIPs were obtained to evaluate the vascular anatomy. Multidetector CT imaging of the abdomen and pelvis was performed using the standard protocol during bolus administration of intravenous contrast. CONTRAST:  1059mISOVUE-370 IOPAMIDOL (ISOVUE-370) INJECTION 76% COMPARISON:   11/27/2017 FINDINGS: CTA CHEST FINDINGS Cardiovascular: Mild right atrial enlargement. Dilated right ventricle, RV/LV ratio 1.5. Large partially occlusive acute pulmonary embolus in the right pulmonary artery extending into the upper and lower lobe segmental branches. Chronic intraluminal  web in left lower lobe pulmonary artery branch with partially occlusive PE in posterior and lateral basal segmental branches, more conspicuous than on prior exam. Adequate contrast opacification of the thoracic aorta with no evidence of dissection, aneurysm, or stenosis. There is classic 3-vessel brachiocephalic arch anatomy without proximal stenosis. No significant atheromatous irregularity. Mediastinum/Nodes: No enlarged mediastinal, hilar, or axillary lymph nodes. Thyroid gland, trachea, and esophagus demonstrate no significant findings. Lungs/Pleura: Stable presumed bronchocele in the lateral basal segment left lower lobe. New interstitial infiltrate in the left upper lobe suprahilar region. Right lung clear. No pneumothorax. No pleural effusion. Musculoskeletal: No chest wall abnormality. No acute or significant osseous findings. Review of the MIP images confirms the above findings. CT ABDOMEN and PELVIS FINDINGS Hepatobiliary: No focal liver abnormality is seen. No gallstones, gallbladder wall thickening, or biliary dilatation. Pancreas: Unremarkable. No pancreatic ductal dilatation or surrounding inflammatory changes. Spleen: Normal in size without focal abnormality. Adrenals/Urinary Tract: Normal adrenals. 0.8 cm probable cyst, upper pole left kidney. 5.2 cm cyst, lower pole right kidney. No hydronephrosis. Urinary bladder incompletely distended. Stomach/Bowel: Stomach is nondistended. Small bowel decompressed. Anastomotic staple line in the terminal ileum. The colon is nondilated. Vascular/Lymphatic: No significant arterial pathology evident. Partially occlusive DVT in the right femoral, deep femoral, and common femoral  veins. There is also some partially occlusive thrombus in the right iliac vein just proximal to its confluence with the cava. IVC appears widely patent. Portal vein patent. No retroperitoneal hemorrhage. Reproductive: Uterus and bilateral adnexa are unremarkable. IUD in expected location. Other: No ascites.  No free air. Musculoskeletal: Edematous/inflammatory changes in the subcutaneous tissues of the visualized proximal right thigh. If regional bones unremarkable. Review of the MIP images confirms the above findings. IMPRESSION: 1. Positive for central partially occlusive right and segmental left lower lobe pulmonary emboli with CT evidence of right heart strain (RV/LV Ratio = 1.5) consistent with at least submassive (intermediate risk) PE. The presence of right heart strain has been associated with an increased risk of morbidity and mortality. Critical Value/emergent results were called by telephone at the time of interpretation on 12/02/2017 at 11:52 am to Dr. Harold Barban , who verbally acknowledged these results. 2. Residual partially occlusive DVT in the right lower extremity and right iliac vein. No evidence of caval thrombus. Electronically Signed   By: Lucrezia Europe M.D.   On: 12/02/2017 11:52   Ir Angiogram Pulmonary Bilateral Selective  Result Date: 12/03/2017 INDICATION: History of lower extremity DVT post lower extremity catheter directed thrombolysis, now with sub massive pulmonary embolism and CT/echo findings worrisome for right-sided heart failure. As such, request made for bilateral pulmonary arterial ultrasound assisted catheter directed thrombolysis as well as potential placement of an IVC filter. EXAM: 1. ULTRASOUND GUIDANCE FOR VENOUS ACCESS X2 2. BILATERAL PULMONARY ARTERIOGRAPHY 3. FLUOROSCOPIC GUIDED PLACEMENT OF BILATERAL PULMONARY ARTERIAL LYTIC INFUSION CATHETERS COMPARISON:  Chest CTA - 12/02/2017 MEDICATIONS: None ANESTHESIA/SEDATION: Moderate (conscious) sedation was employed during  this procedure. A total of Versed 4 mg, Dilaudid 0.5 mg and Fentanyl 150 mcg was administered intravenously. Moderate Sedation Time: 39 minutes. The patient's level of consciousness and vital signs were monitored continuously by radiology nursing throughout the procedure under my direct supervision. CONTRAST:  15 cc Isovue 300 FLUOROSCOPY TIME:  4 minutes 54 seconds (932 mGy) COMPLICATIONS: None immediate. TECHNIQUE: Informed written consent was obtained from the patient after a discussion of the risks, benefits and alternatives to treatment. Questions regarding the procedure were encouraged and answered. A timeout was performed prior  to the initiation of the procedure. Given the presence of lower extremity DVT, the decision was made to perform the procedure via right internal jugular approach. As such, the right neck was prepped and draped in the usual sterile fashion, and a sterile drape was applied covering the operative field. Maximum barrier sterile technique with sterile gowns and gloves were used for the procedure. A timeout was performed prior to the initiation of the procedure. Local anesthesia was provided with 1% lidocaine. Under direct ultrasound guidance, the right internal jugular vein was accessed with a micro puncture sheath ultimately allowing placement of a 6 French vascular sheath. Slightly cranial to this initial access, the right internal jugular vein was again accessed with a micropuncture sheath ultimately allowing placement of an additional 6 French vascular sheath. With the use of a Bentson wire, a C2 catheter was advanced into the main pulmonary artery and a limited pulmonary arteriogram was performed with a hand injection. Pressure measurements were then obtained from the level of the main pulmonary artery. With use of a Bentson wire, the C2 catheter was advanced into the left pulmonary artery. Limited contrast injection confirmed appropriate positioning. Over an exchange length Rosen  wire, the C2 catheter was exchanged for a 105/18 cm multi side-hole EKOS ultrasound assisted infusion catheter. Next, a Bentson wire was utilized to advance a C2 catheter into the right pulmonary artery. Limited contrast injection confirmed appropriate positioning. Over an exchange length Rosen wire, the C2 catheter was exchanged for a 105/12 cm multi side-hole EKOS ultrasound assisted infusion catheter. A postprocedural fluoroscopic image was obtained of the check demonstrating final catheter positioning. Both vascular sheath were secured at the right neck with an interrupted 0 Prolene suture. The external catheter tubing was secured at the right chest and the lytic therapy was initiated. The patient tolerated the procedure well without immediate postprocedural complication. FINDINGS: Limited pulmonary arteriogram was performed for location documentation purposes. Acquired pressure measurements: Main pulmonary artery - 46/19 (mean - 39) (normal: < 25/10) Following the procedure, both ultrasound assisted infusion catheter tips terminate within the distal aspects of the bilateral lower lobe sub segmental pulmonary arteries. IMPRESSION: 1. Successful fluoroscopic guided initiation of bilateral ultrasound assisted catheter directed pulmonary arterial lysis for sub massive pulmonary embolism and right-sided heart strain. 2. Markedly elevated pressure measurements within the main pulmonary artery compatible with critical pulmonary arterial hypertension. PLAN: The patient will return to the interventional radiology suite following 12 hours bilateral pulmonary arterial catheter directed thrombolysis for repeat pressure measurements as well as potential placement of an IVC filter. Electronically Signed   By: Sandi Mariscal M.D.   On: 12/03/2017 16:10   Ir Angiogram Selective Each Additional Vessel  Result Date: 12/03/2017 INDICATION: History of lower extremity DVT post lower extremity catheter directed thrombolysis, now  with sub massive pulmonary embolism and CT/echo findings worrisome for right-sided heart failure. As such, request made for bilateral pulmonary arterial ultrasound assisted catheter directed thrombolysis as well as potential placement of an IVC filter. EXAM: 1. ULTRASOUND GUIDANCE FOR VENOUS ACCESS X2 2. BILATERAL PULMONARY ARTERIOGRAPHY 3. FLUOROSCOPIC GUIDED PLACEMENT OF BILATERAL PULMONARY ARTERIAL LYTIC INFUSION CATHETERS COMPARISON:  Chest CTA - 12/02/2017 MEDICATIONS: None ANESTHESIA/SEDATION: Moderate (conscious) sedation was employed during this procedure. A total of Versed 4 mg, Dilaudid 0.5 mg and Fentanyl 150 mcg was administered intravenously. Moderate Sedation Time: 39 minutes. The patient's level of consciousness and vital signs were monitored continuously by radiology nursing throughout the procedure under my direct supervision. CONTRAST:  15 cc Isovue  300 FLUOROSCOPY TIME:  4 minutes 54 seconds (202 mGy) COMPLICATIONS: None immediate. TECHNIQUE: Informed written consent was obtained from the patient after a discussion of the risks, benefits and alternatives to treatment. Questions regarding the procedure were encouraged and answered. A timeout was performed prior to the initiation of the procedure. Given the presence of lower extremity DVT, the decision was made to perform the procedure via right internal jugular approach. As such, the right neck was prepped and draped in the usual sterile fashion, and a sterile drape was applied covering the operative field. Maximum barrier sterile technique with sterile gowns and gloves were used for the procedure. A timeout was performed prior to the initiation of the procedure. Local anesthesia was provided with 1% lidocaine. Under direct ultrasound guidance, the right internal jugular vein was accessed with a micro puncture sheath ultimately allowing placement of a 6 French vascular sheath. Slightly cranial to this initial access, the right internal jugular vein  was again accessed with a micropuncture sheath ultimately allowing placement of an additional 6 French vascular sheath. With the use of a Bentson wire, a C2 catheter was advanced into the main pulmonary artery and a limited pulmonary arteriogram was performed with a hand injection. Pressure measurements were then obtained from the level of the main pulmonary artery. With use of a Bentson wire, the C2 catheter was advanced into the left pulmonary artery. Limited contrast injection confirmed appropriate positioning. Over an exchange length Rosen wire, the C2 catheter was exchanged for a 105/18 cm multi side-hole EKOS ultrasound assisted infusion catheter. Next, a Bentson wire was utilized to advance a C2 catheter into the right pulmonary artery. Limited contrast injection confirmed appropriate positioning. Over an exchange length Rosen wire, the C2 catheter was exchanged for a 105/12 cm multi side-hole EKOS ultrasound assisted infusion catheter. A postprocedural fluoroscopic image was obtained of the check demonstrating final catheter positioning. Both vascular sheath were secured at the right neck with an interrupted 0 Prolene suture. The external catheter tubing was secured at the right chest and the lytic therapy was initiated. The patient tolerated the procedure well without immediate postprocedural complication. FINDINGS: Limited pulmonary arteriogram was performed for location documentation purposes. Acquired pressure measurements: Main pulmonary artery - 46/19 (mean - 39) (normal: < 25/10) Following the procedure, both ultrasound assisted infusion catheter tips terminate within the distal aspects of the bilateral lower lobe sub segmental pulmonary arteries. IMPRESSION: 1. Successful fluoroscopic guided initiation of bilateral ultrasound assisted catheter directed pulmonary arterial lysis for sub massive pulmonary embolism and right-sided heart strain. 2. Markedly elevated pressure measurements within the main  pulmonary artery compatible with critical pulmonary arterial hypertension. PLAN: The patient will return to the interventional radiology suite following 12 hours bilateral pulmonary arterial catheter directed thrombolysis for repeat pressure measurements as well as potential placement of an IVC filter. Electronically Signed   By: Sandi Mariscal M.D.   On: 12/03/2017 16:10   Ir Angiogram Selective Each Additional Vessel  Result Date: 12/03/2017 INDICATION: History of lower extremity DVT post lower extremity catheter directed thrombolysis, now with sub massive pulmonary embolism and CT/echo findings worrisome for right-sided heart failure. As such, request made for bilateral pulmonary arterial ultrasound assisted catheter directed thrombolysis as well as potential placement of an IVC filter. EXAM: 1. ULTRASOUND GUIDANCE FOR VENOUS ACCESS X2 2. BILATERAL PULMONARY ARTERIOGRAPHY 3. FLUOROSCOPIC GUIDED PLACEMENT OF BILATERAL PULMONARY ARTERIAL LYTIC INFUSION CATHETERS COMPARISON:  Chest CTA - 12/02/2017 MEDICATIONS: None ANESTHESIA/SEDATION: Moderate (conscious) sedation was employed during this procedure. A  total of Versed 4 mg, Dilaudid 0.5 mg and Fentanyl 150 mcg was administered intravenously. Moderate Sedation Time: 39 minutes. The patient's level of consciousness and vital signs were monitored continuously by radiology nursing throughout the procedure under my direct supervision. CONTRAST:  15 cc Isovue 300 FLUOROSCOPY TIME:  4 minutes 54 seconds (737 mGy) COMPLICATIONS: None immediate. TECHNIQUE: Informed written consent was obtained from the patient after a discussion of the risks, benefits and alternatives to treatment. Questions regarding the procedure were encouraged and answered. A timeout was performed prior to the initiation of the procedure. Given the presence of lower extremity DVT, the decision was made to perform the procedure via right internal jugular approach. As such, the right neck was prepped and  draped in the usual sterile fashion, and a sterile drape was applied covering the operative field. Maximum barrier sterile technique with sterile gowns and gloves were used for the procedure. A timeout was performed prior to the initiation of the procedure. Local anesthesia was provided with 1% lidocaine. Under direct ultrasound guidance, the right internal jugular vein was accessed with a micro puncture sheath ultimately allowing placement of a 6 French vascular sheath. Slightly cranial to this initial access, the right internal jugular vein was again accessed with a micropuncture sheath ultimately allowing placement of an additional 6 French vascular sheath. With the use of a Bentson wire, a C2 catheter was advanced into the main pulmonary artery and a limited pulmonary arteriogram was performed with a hand injection. Pressure measurements were then obtained from the level of the main pulmonary artery. With use of a Bentson wire, the C2 catheter was advanced into the left pulmonary artery. Limited contrast injection confirmed appropriate positioning. Over an exchange length Rosen wire, the C2 catheter was exchanged for a 105/18 cm multi side-hole EKOS ultrasound assisted infusion catheter. Next, a Bentson wire was utilized to advance a C2 catheter into the right pulmonary artery. Limited contrast injection confirmed appropriate positioning. Over an exchange length Rosen wire, the C2 catheter was exchanged for a 105/12 cm multi side-hole EKOS ultrasound assisted infusion catheter. A postprocedural fluoroscopic image was obtained of the check demonstrating final catheter positioning. Both vascular sheath were secured at the right neck with an interrupted 0 Prolene suture. The external catheter tubing was secured at the right chest and the lytic therapy was initiated. The patient tolerated the procedure well without immediate postprocedural complication. FINDINGS: Limited pulmonary arteriogram was performed for  location documentation purposes. Acquired pressure measurements: Main pulmonary artery - 46/19 (mean - 39) (normal: < 25/10) Following the procedure, both ultrasound assisted infusion catheter tips terminate within the distal aspects of the bilateral lower lobe sub segmental pulmonary arteries. IMPRESSION: 1. Successful fluoroscopic guided initiation of bilateral ultrasound assisted catheter directed pulmonary arterial lysis for sub massive pulmonary embolism and right-sided heart strain. 2. Markedly elevated pressure measurements within the main pulmonary artery compatible with critical pulmonary arterial hypertension. PLAN: The patient will return to the interventional radiology suite following 12 hours bilateral pulmonary arterial catheter directed thrombolysis for repeat pressure measurements as well as potential placement of an IVC filter. Electronically Signed   By: Sandi Mariscal M.D.   On: 12/03/2017 16:10   Ir US Guide Vasc Access Right  Result Date: 12/03/2017 INDICATION: History of lower extremity DVT post lower extremity catheter directed thrombolysis, now with sub massive pulmonary embolism and CT/echo findings worrisome for right-sided heart failure. As such, request made for bilateral pulmonary arterial ultrasound assisted catheter directed thrombolysis as well as potential  placement of an IVC filter. EXAM: 1. ULTRASOUND GUIDANCE FOR VENOUS ACCESS X2 2. BILATERAL PULMONARY ARTERIOGRAPHY 3. FLUOROSCOPIC GUIDED PLACEMENT OF BILATERAL PULMONARY ARTERIAL LYTIC INFUSION CATHETERS COMPARISON:  Chest CTA - 12/02/2017 MEDICATIONS: None ANESTHESIA/SEDATION: Moderate (conscious) sedation was employed during this procedure. A total of Versed 4 mg, Dilaudid 0.5 mg and Fentanyl 150 mcg was administered intravenously. Moderate Sedation Time: 39 minutes. The patient's level of consciousness and vital signs were monitored continuously by radiology nursing throughout the procedure under my direct supervision.  CONTRAST:  15 cc Isovue 300 FLUOROSCOPY TIME:  4 minutes 54 seconds (161 mGy) COMPLICATIONS: None immediate. TECHNIQUE: Informed written consent was obtained from the patient after a discussion of the risks, benefits and alternatives to treatment. Questions regarding the procedure were encouraged and answered. A timeout was performed prior to the initiation of the procedure. Given the presence of lower extremity DVT, the decision was made to perform the procedure via right internal jugular approach. As such, the right neck was prepped and draped in the usual sterile fashion, and a sterile drape was applied covering the operative field. Maximum barrier sterile technique with sterile gowns and gloves were used for the procedure. A timeout was performed prior to the initiation of the procedure. Local anesthesia was provided with 1% lidocaine. Under direct ultrasound guidance, the right internal jugular vein was accessed with a micro puncture sheath ultimately allowing placement of a 6 French vascular sheath. Slightly cranial to this initial access, the right internal jugular vein was again accessed with a micropuncture sheath ultimately allowing placement of an additional 6 French vascular sheath. With the use of a Bentson wire, a C2 catheter was advanced into the main pulmonary artery and a limited pulmonary arteriogram was performed with a hand injection. Pressure measurements were then obtained from the level of the main pulmonary artery. With use of a Bentson wire, the C2 catheter was advanced into the left pulmonary artery. Limited contrast injection confirmed appropriate positioning. Over an exchange length Rosen wire, the C2 catheter was exchanged for a 105/18 cm multi side-hole EKOS ultrasound assisted infusion catheter. Next, a Bentson wire was utilized to advance a C2 catheter into the right pulmonary artery. Limited contrast injection confirmed appropriate positioning. Over an exchange length Rosen wire, the  C2 catheter was exchanged for a 105/12 cm multi side-hole EKOS ultrasound assisted infusion catheter. A postprocedural fluoroscopic image was obtained of the check demonstrating final catheter positioning. Both vascular sheath were secured at the right neck with an interrupted 0 Prolene suture. The external catheter tubing was secured at the right chest and the lytic therapy was initiated. The patient tolerated the procedure well without immediate postprocedural complication. FINDINGS: Limited pulmonary arteriogram was performed for location documentation purposes. Acquired pressure measurements: Main pulmonary artery - 46/19 (mean - 39) (normal: < 25/10) Following the procedure, both ultrasound assisted infusion catheter tips terminate within the distal aspects of the bilateral lower lobe sub segmental pulmonary arteries. IMPRESSION: 1. Successful fluoroscopic guided initiation of bilateral ultrasound assisted catheter directed pulmonary arterial lysis for sub massive pulmonary embolism and right-sided heart strain. 2. Markedly elevated pressure measurements within the main pulmonary artery compatible with critical pulmonary arterial hypertension. PLAN: The patient will return to the interventional radiology suite following 12 hours bilateral pulmonary arterial catheter directed thrombolysis for repeat pressure measurements as well as potential placement of an IVC filter. Electronically Signed   By: Sandi Mariscal M.D.   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 As such, request made for bilateral pulmonary arterial ultrasound assisted catheter directed thrombolysis as well as potential placement of an IVC filter. EXAM: 1. ULTRASOUND GUIDANCE FOR VENOUS ACCESS X2 2.  BILATERAL PULMONARY ARTERIOGRAPHY 3. FLUOROSCOPIC GUIDED PLACEMENT OF BILATERAL PULMONARY ARTERIAL LYTIC INFUSION CATHETERS COMPARISON:  Chest CTA - 12/02/2017 MEDICATIONS: None ANESTHESIA/SEDATION: Moderate (conscious) sedation was employed during this procedure. A total of Versed 4 mg, Dilaudid 0.5 mg and Fentanyl 150 mcg was administered intravenously. Moderate Sedation Time: 39 minutes. The patient's level of consciousness and vital signs were monitored continuously by radiology nursing throughout the procedure under my direct supervision. CONTRAST:  15 cc Isovue 300 FLUOROSCOPY TIME:  4 minutes 54 seconds (093 mGy) COMPLICATIONS: None immediate. TECHNIQUE: Informed written consent was obtained from the patient after a discussion of the risks, benefits and alternatives to treatment. Questions regarding the procedure were encouraged and answered. A timeout was performed prior to the initiation of the procedure. Given the presence of lower extremity DVT, the decision was made to perform the procedure via right internal jugular approach. As such, the right neck was prepped and draped in the usual sterile fashion, and a sterile drape was applied covering the operative field. Maximum barrier sterile technique with sterile gowns and gloves were used for the procedure. A timeout was performed prior to the initiation of the procedure. Local anesthesia was provided with 1% lidocaine. Under direct ultrasound guidance, the right internal jugular vein was accessed with a micro puncture sheath ultimately allowing placement of a 6 French vascular sheath. Slightly cranial to this initial access, the right internal jugular vein was again accessed with a micropuncture sheath ultimately allowing placement of an additional 6 French vascular sheath. With the use of a Bentson wire, a C2 catheter was advanced into the main pulmonary artery and a limited pulmonary arteriogram was performed with a hand injection. Pressure measurements  were then obtained from the level of the main pulmonary artery. With use of a Bentson wire, the C2 catheter was advanced into the left pulmonary artery. Limited contrast injection confirmed appropriate positioning. Over an exchange length Rosen wire, the C2 catheter was exchanged for a 105/18 cm multi side-hole EKOS ultrasound assisted infusion catheter. Next, a Bentson wire was utilized to advance a C2 catheter into the right pulmonary artery. Limited contrast injection confirmed appropriate positioning. Over an exchange length Rosen wire, the C2 catheter was exchanged for a 105/12 cm multi side-hole EKOS ultrasound assisted infusion catheter. A postprocedural fluoroscopic image was obtained of the check demonstrating final catheter positioning. Both vascular sheath were secured at the right neck with an interrupted 0 Prolene suture. The external catheter tubing was secured at the right chest and the lytic therapy was initiated. The patient tolerated the procedure well without immediate postprocedural complication. FINDINGS: Limited pulmonary arteriogram was performed for location documentation purposes. Acquired pressure measurements: Main pulmonary artery - 46/19 (mean - 39) (normal: < 25/10) Following the procedure, both ultrasound assisted infusion catheter tips terminate within the distal aspects of the bilateral lower lobe sub segmental pulmonary arteries. IMPRESSION: 1. Successful fluoroscopic guided initiation of bilateral ultrasound assisted catheter directed pulmonary arterial lysis for sub massive pulmonary embolism and right-sided heart strain. 2. Markedly elevated pressure measurements within the main pulmonary artery compatible with critical pulmonary arterial hypertension. PLAN: The patient will return to the interventional radiology  suite following 12 hours bilateral pulmonary arterial catheter directed thrombolysis for repeat pressure measurements as well as potential placement of an IVC filter.  Electronically Signed   By: Sandi Mariscal M.D.   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 As such, request made for bilateral pulmonary arterial ultrasound assisted catheter directed thrombolysis as well as potential placement of an IVC filter. EXAM: 1. ULTRASOUND GUIDANCE FOR VENOUS ACCESS X2 2. BILATERAL PULMONARY ARTERIOGRAPHY 3. FLUOROSCOPIC GUIDED PLACEMENT OF BILATERAL PULMONARY ARTERIAL LYTIC INFUSION CATHETERS COMPARISON:  Chest CTA - 12/02/2017 MEDICATIONS: None ANESTHESIA/SEDATION: Moderate (conscious) sedation was employed during this procedure. A total of Versed 4 mg, Dilaudid 0.5 mg and Fentanyl 150 mcg was administered intravenously. Moderate Sedation Time: 39 minutes. The patient's level of consciousness and vital signs were monitored continuously by radiology nursing throughout the procedure under my direct supervision. CONTRAST:  15 cc Isovue 300 FLUOROSCOPY TIME:  4 minutes 54 seconds (809 mGy) COMPLICATIONS: None immediate. TECHNIQUE: Informed written consent was obtained from the patient after a discussion of the risks, benefits and alternatives to treatment. Questions regarding the procedure were encouraged and answered. A timeout was performed prior to the initiation of the procedure. Given the presence of lower extremity DVT, the decision was made to perform the procedure via right internal jugular approach. As such, the right neck was prepped and draped in the usual sterile fashion, and a sterile drape was applied covering the operative field. Maximum barrier sterile technique with sterile gowns and gloves were used for the procedure. A timeout was performed prior to the initiation of the procedure. Local anesthesia was provided with 1% lidocaine.  Under direct ultrasound guidance, the right internal jugular vein was accessed with a micro puncture sheath ultimately allowing placement of a 6 French vascular sheath. Slightly cranial to this initial access, the right internal jugular vein was again accessed with a micropuncture sheath ultimately allowing placement of an additional 6 French vascular sheath. With the use of a Bentson wire, a C2 catheter was advanced into the main pulmonary artery and a limited pulmonary arteriogram was performed with a hand injection. Pressure measurements were then obtained from the level of the main pulmonary artery. With use of a Bentson wire, the C2 catheter was advanced into the left pulmonary artery. Limited contrast injection confirmed appropriate positioning. Over an exchange length Rosen wire, the C2 catheter was exchanged for a 105/18 cm multi side-hole EKOS ultrasound assisted infusion catheter. Next, a Bentson wire was utilized to advance a C2 catheter into the right pulmonary artery. Limited contrast injection confirmed appropriate positioning. Over an exchange length Rosen wire, the C2 catheter was exchanged for a 105/12 cm multi side-hole EKOS ultrasound assisted infusion catheter. A postprocedural fluoroscopic image was obtained of the check demonstrating final catheter positioning. Both vascular sheath were secured at the right neck with an interrupted 0 Prolene suture. The external catheter tubing was secured at the right chest and the lytic therapy was initiated. The patient tolerated the procedure well without immediate postprocedural complication. FINDINGS: Limited pulmonary arteriogram was performed for location documentation purposes. Acquired pressure measurements: Main pulmonary artery - 46/19 (mean - 39) (normal: < 25/10) Following the procedure, both ultrasound assisted infusion catheter tips terminate within the distal aspects of the bilateral lower lobe sub segmental pulmonary arteries. IMPRESSION: 1.  Successful fluoroscopic guided initiation of bilateral ultrasound assisted catheter directed pulmonary arterial lysis for sub massive pulmonary embolism and right-sided heart strain. 2. Markedly elevated pressure measurements within the main pulmonary artery compatible with critical pulmonary arterial hypertension. PLAN: The patient will return to the interventional radiology suite following 12 hours bilateral pulmonary arterial catheter directed thrombolysis for repeat pressure measurements as well as potential placement of an IVC filter. Electronically Signed   By: Sandi Mariscal M.D.   On: 12/03/2017 16:10   Anti-infectives: Anti-infectives (From admission, onward)   None      Assessment/Plan: s/p Procedure(s): PERIPHERAL VASCULAR THROMBECTOMY - Lysis Recheck (N/A) PERIPHERAL VASCULAR BALLOON ANGIOPLASTY (Right) DVT and subsequent large right pulmonary embolus.  Currently undergoing lysis.  For recheck her interventional radiology later today.  Changed pain medication from fentanyl to Dilaudid.   LOS: 7 days   Chares Slaymaker 12/04/2017, 7:12 AM

## 2017-12-05 LAB — CBC
HEMATOCRIT: 23.7 % — AB (ref 36.0–46.0)
HEMOGLOBIN: 7.4 g/dL — AB (ref 12.0–15.0)
MCH: 30.3 pg (ref 26.0–34.0)
MCHC: 31.2 g/dL (ref 30.0–36.0)
MCV: 97.1 fL (ref 78.0–100.0)
Platelets: 299 10*3/uL (ref 150–400)
RBC: 2.44 MIL/uL — ABNORMAL LOW (ref 3.87–5.11)
RDW: 15.9 % — ABNORMAL HIGH (ref 11.5–15.5)
WBC: 6.7 10*3/uL (ref 4.0–10.5)

## 2017-12-05 LAB — HEPARIN LEVEL (UNFRACTIONATED): HEPARIN UNFRACTIONATED: 0.41 [IU]/mL (ref 0.30–0.70)

## 2017-12-05 MED ORDER — APIXABAN 5 MG PO TABS: 5.0000 mg | ORAL_TABLET | Freq: Two times a day (BID) | ORAL | Status: DC

## 2017-12-05 MED ORDER — APIXABAN 5 MG PO TABS
10.0000 mg | ORAL_TABLET | Freq: Two times a day (BID) | ORAL | Status: DC
  Filled 2017-12-05: qty 2

## 2017-12-05 MED ORDER — POTASSIUM CHLORIDE CRYS ER 20 MEQ PO TBCR
40.0000 meq | EXTENDED_RELEASE_TABLET | Freq: Once | ORAL | Status: AC
  Administered 2017-12-05: 40 meq via ORAL
  Filled 2017-12-05: qty 2

## 2017-12-05 MED ORDER — ALBUTEROL SULFATE (2.5 MG/3ML) 0.083% IN NEBU
2.5000 mg | INHALATION_SOLUTION | Freq: Three times a day (TID) | RESPIRATORY_TRACT | Status: DC
  Administered 2017-12-05 – 2017-12-06 (×2): 2.5 mg via RESPIRATORY_TRACT
  Filled 2017-12-05 (×2): qty 3

## 2017-12-05 MED ORDER — APIXABAN 5 MG PO TABS
5.0000 mg | ORAL_TABLET | Freq: Two times a day (BID) | ORAL | Status: DC
Start: 2017-12-12 — End: 2017-12-06

## 2017-12-05 MED ORDER — FUROSEMIDE 10 MG/ML IJ SOLN
40.0000 mg | Freq: Once | INTRAMUSCULAR | Status: AC
  Administered 2017-12-05: 40 mg via INTRAVENOUS
  Filled 2017-12-05: qty 4

## 2017-12-05 MED ORDER — APIXABAN 5 MG PO TABS
10.0000 mg | ORAL_TABLET | Freq: Two times a day (BID) | ORAL | Status: DC
  Administered 2017-12-05 – 2017-12-06 (×4): 10 mg via ORAL
  Filled 2017-12-05 (×5): qty 2

## 2017-12-05 NOTE — Progress Notes (Signed)
Patient ambulating to bathroom with assistance. O2 increased to 4L from 2L for desaturation while ambulating. Patients saturations 86 on 2L with ambulation. When increased to 4L no desaturations with exertion. Patient stable now on 2L O2 with 97%saturations.

## 2017-12-05 NOTE — Progress Notes (Addendum)
Addendum:  Orders received to change IV Heparin to Apixaban.   Plan:  Start Apixaban 72m po BID x7 days then 580mpo BID.  Stop IV Heparin with first dose of Apixaban.   JeSloan LeiterPharmD, BCPS, BCCCP Clinical Pharmacist Clinical phone 12/05/2017 until 3:30PM - [phone] hours, please call #[phone]/01/2018, 2:18 PM   ANTICOAGULATION CONSULT NOTE - [place] Heparin  Indication: pulmonary embolus and DVT  Allergies  Allergen Reactions  . Ibuprofen Other (See Comments)    Avoids due to Crohns disease  . Morphine And Related Itching    itchy  . Prednisone Other (See Comments)    "crazy", hallucinations  . Adhesive [Tape] Rash    Rash with electrodes    Patient Measurements: Height: 5' 4"  (162.6 cm) Weight: 220 lb (99.8 kg) IBW/kg (Calculated) : 54.7  Vital Signs: Temp: 97.9 F (36.6 C) (02/04 1107) Temp Source: Oral (02/04 1107) BP: 121/89 (02/04 0700) Pulse Rate: 96 (02/04 1000)  Labs: Recent Labs    12/04/17 0529 12/04/17 1131 12/04/17 1833 12/05/17 0354  HGB 7.5* 8.1*  --  7.4*  HCT 23.2* 25.0*  --  23.7*  PLT 269 285  --  299  HEPARINUNFRC 0.51 0.42 0.46 0.41  CREATININE 0.86  --   --   --     Estimated Creatinine Clearance: 100.8 mL/min (by C-G formula based on SCr of 0.86 mg/dL).   Assessment: [age]/o F with DVT/PE, currently on heparin at 2000 units/hr. Patient underwent EKOS procedure with TPA stopped around 0400 t and sheaths pulled at 1030 on 2/3. Note history of Crohn's and rectal bleeding.   Heparin remains therapeutic at 0.41. H/H is slightly down today and platelets are wnl. No signs of bleeding noted. IVC filter in place.   Goal of Therapy:  Heparin level 0.3-0.7 units/ml Monitor platelets by anticoagulation protocol: Yes   Plan:  Continue heparin at 2000 units/hr Daily heparin level/CBC Monitor closely for bleeding Follow-up plan for long-term anticoagulation plan  JeSloan LeiterPharmD, BCPS,  BCCCP Clinical Pharmacist Clinical phone 12/05/2017 until 3:30PM - - [phone] hours, please call #[phone] 12/05/2017,11:47 AM

## 2017-12-05 NOTE — Progress Notes (Signed)
Name: Lindsay Mccarthy MRN: [id number] DOB: Jun 22, 1976    ADMISSION DATE:  11/27/2017 CONSULTATION DATE: December 03, 2017  REFERRING MD : Vascular surgery CHIEF COMPLAINT: Right lower extremity pain shortness of breath  BRIEF PATIENT DESCRIPTION: Patient is 42 year old Caucasian Mccarthy with extensive DVT and PE she had thrombectomy mechanical on Tuesday and Wednesday with recurrence of more PE clot load with extensive right lower extremity DVT  SIGNIFICANT EVENTS  Echo with McConnell sign  STUDIES:  CT with more clot burden the RV dilatation RV to LV ratio is one-point more than 1.5   HISTORY OF PRESENT ILLNESS: This is a Lindsay Mccarthy with history of Crohn's disease on biological therapy and Imuran and relapse who presents to the hospital few days history of right lower extremity swelling pain shortness of breath heaviness in her chest she was found to have PE and extensive DVT on Wednesday and Tuesday of last week she had mechanical thrombectomy repeat CAT scan was done yesterday showed more clot burden with RV dilatation McConnell sign  RV to LV ratio more than 1.5 patient still having heavy breathing hypoxic when she is not on oxygen tachypneic and tachycardic she cannot ambulate without feeling very short of breath patient had extensive history of Crohn's disease about a month ago she had bloody stools with her diarrhea.  She had been working as an IT person and has not had any evidence with with provoked PE she is considering herself has not a sedentary lifestyle where she moves.  She had no recent orthopedic surgery her contraceptive Mirena implant has been there for a long time.  Patient has no history of recurrent DVTs or family history of hypercoagulable state she does not smoke she has IUD  SUBJECTIVE / Interval events:  No acute events.  Vitals stable.  Has some wheezing this AM. Is +15L positive since admit.   VITAL SIGNS: Temp:  [97.2 F (36.2 C)-98.4 F  (36.9 C)] 98.2 F (36.8 C) (02/04 0820) Pulse Rate:  [80-118] 96 (02/04 1000) Resp:  [9-21] 18 (02/04 1000) BP: (97-145)/(62-98) 121/Lindsay (02/04 0700) SpO2:  [84 %-100 %] 96 % (02/04 1000)  PHYSICAL EXAMINATION: General: Obese woman in no distress Neuro: awake, interacting appropriately, non-focal HEENT:  [place]/AT, Moist MM Cardiovascular: RRR, no M/R/G Lungs:  Very faint end expiratory wheezes Abdomen:  Soft, benign Ext: 1+ edema Skin:  No rash    Recent Labs  Lab 12/01/17 1025 12/02/17 0244 12/04/17 0529  NA 139 139 141  K 3.3* 3.2* 3.4*  CL 109 108 106  CO2 19* 21* 23  BUN 6 5* 5*  CREATININE 0.93 0.87 0.86  GLUCOSE 109* 93 93   Recent Labs  Lab 12/04/17 0529 12/04/17 1131 12/05/17 0354  HGB 7.5* 8.1* 7.4*  HCT 23.2* 25.0* 23.7*  WBC 5.4 7.3 6.7  PLT 269 285 299   Ir Angiogram Pulmonary Bilateral Selective  Result Date: 12/03/2017 INDICATION: History of lower extremity DVT post lower extremity catheter directed thrombolysis, now with sub massive pulmonary embolism and CT/echo findings worrisome for right-sided heart failure. As such, request made for bilateral pulmonary arterial ultrasound assisted catheter directed thrombolysis as well as potential placement of an IVC filter. EXAM: 1. ULTRASOUND GUIDANCE FOR VENOUS ACCESS X2 2. BILATERAL PULMONARY ARTERIOGRAPHY 3. FLUOROSCOPIC GUIDED PLACEMENT OF BILATERAL PULMONARY ARTERIAL LYTIC INFUSION CATHETERS COMPARISON:  Chest CTA - 12/02/2017 MEDICATIONS: None ANESTHESIA/SEDATION: Moderate (conscious) sedation was employed during this procedure. A total of Versed 4 mg, Dilaudid 0.5 mg and  Fentanyl 150 mcg was administered intravenously. Moderate Sedation Time: 39 minutes. The patient's level of consciousness and vital signs were monitored continuously by radiology nursing throughout the procedure under my direct supervision. CONTRAST:  15 cc Isovue 300 FLUOROSCOPY TIME:  4 minutes 54 seconds (378 mGy) COMPLICATIONS: None immediate.  TECHNIQUE: Informed written consent was obtained from the patient after a discussion of the risks, benefits and alternatives to treatment. Questions regarding the procedure were encouraged and answered. A timeout was performed prior to the initiation of the procedure. Given the presence of lower extremity DVT, the decision was made to perform the procedure via right internal jugular approach. As such, the right neck was prepped and draped in the usual sterile fashion, and a sterile drape was applied covering the operative field. Maximum barrier sterile technique with sterile gowns and gloves were used for the procedure. A timeout was performed prior to the initiation of the procedure. Local anesthesia was provided with 1% lidocaine. Under direct ultrasound guidance, the right internal jugular vein was accessed with a micro puncture sheath ultimately allowing placement of a 6 French vascular sheath. Slightly cranial to this initial access, the right internal jugular vein was again accessed with a micropuncture sheath ultimately allowing placement of an additional 6 French vascular sheath. With the use of a Bentson wire, a C2 catheter was advanced into the main pulmonary artery and a limited pulmonary arteriogram was performed with a hand injection. Pressure measurements were then obtained from the level of the main pulmonary artery. With use of a Bentson wire, the C2 catheter was advanced into the left pulmonary artery. Limited contrast injection confirmed appropriate positioning. Over an exchange length Rosen wire, the C2 catheter was exchanged for a 105/18 cm multi side-hole EKOS ultrasound assisted infusion catheter. Next, a Bentson wire was utilized to advance a C2 catheter into the right pulmonary artery. Limited contrast injection confirmed appropriate positioning. Over an exchange length Rosen wire, the C2 catheter was exchanged for a 105/12 cm multi side-hole EKOS ultrasound assisted infusion catheter. A  postprocedural fluoroscopic image was obtained of the check demonstrating final catheter positioning. Both vascular sheath were secured at the right neck with an interrupted 0 Prolene suture. The external catheter tubing was secured at the right chest and the lytic therapy was initiated. The patient tolerated the procedure well without immediate postprocedural complication. FINDINGS: Limited pulmonary arteriogram was performed for location documentation purposes. Acquired pressure measurements: Main pulmonary artery - 46/19 (mean - 39) (normal: < 25/10) Following the procedure, both ultrasound assisted infusion catheter tips terminate within the distal aspects of the bilateral lower lobe sub segmental pulmonary arteries. IMPRESSION: 1. Successful fluoroscopic guided initiation of bilateral ultrasound assisted catheter directed pulmonary arterial lysis for sub massive pulmonary embolism and right-sided heart strain. 2. Markedly elevated pressure measurements within the main pulmonary artery compatible with critical pulmonary arterial hypertension. PLAN: The patient will return to the interventional radiology suite following 12 hours bilateral pulmonary arterial catheter directed thrombolysis for repeat pressure measurements as well as potential placement of an IVC filter. Electronically Signed   By: Sandi Mariscal M.D.   On: 12/03/2017 16:10   Ir Angiogram Selective Each Additional Vessel  Result Date: 12/03/2017 INDICATION: History of lower extremity DVT post lower extremity catheter directed thrombolysis, now with sub massive pulmonary embolism and CT/echo findings worrisome for right-sided heart failure. As such, request made for bilateral pulmonary arterial ultrasound assisted catheter directed thrombolysis as well as potential placement of an IVC filter. EXAM: 1. ULTRASOUND GUIDANCE  FOR VENOUS ACCESS X2 2. BILATERAL PULMONARY ARTERIOGRAPHY 3. FLUOROSCOPIC GUIDED PLACEMENT OF BILATERAL PULMONARY ARTERIAL LYTIC  INFUSION CATHETERS COMPARISON:  Chest CTA - 12/02/2017 MEDICATIONS: None ANESTHESIA/SEDATION: Moderate (conscious) sedation was employed during this procedure. A total of Versed 4 mg, Dilaudid 0.5 mg and Fentanyl 150 mcg was administered intravenously. Moderate Sedation Time: 39 minutes. The patient's level of consciousness and vital signs were monitored continuously by radiology nursing throughout the procedure under my direct supervision. CONTRAST:  15 cc Isovue 300 FLUOROSCOPY TIME:  4 minutes 54 seconds (518 mGy) COMPLICATIONS: None immediate. TECHNIQUE: Informed written consent was obtained from the patient after a discussion of the risks, benefits and alternatives to treatment. Questions regarding the procedure were encouraged and answered. A timeout was performed prior to the initiation of the procedure. Given the presence of lower extremity DVT, the decision was made to perform the procedure via right internal jugular approach. As such, the right neck was prepped and draped in the usual sterile fashion, and a sterile drape was applied covering the operative field. Maximum barrier sterile technique with sterile gowns and gloves were used for the procedure. A timeout was performed prior to the initiation of the procedure. Local anesthesia was provided with 1% lidocaine. Under direct ultrasound guidance, the right internal jugular vein was accessed with a micro puncture sheath ultimately allowing placement of a 6 French vascular sheath. Slightly cranial to this initial access, the right internal jugular vein was again accessed with a micropuncture sheath ultimately allowing placement of an additional 6 French vascular sheath. With the use of a Bentson wire, a C2 catheter was advanced into the main pulmonary artery and a limited pulmonary arteriogram was performed with a hand injection. Pressure measurements were then obtained from the level of the main pulmonary artery. With use of a Bentson wire, the C2  catheter was advanced into the left pulmonary artery. Limited contrast injection confirmed appropriate positioning. Over an exchange length Rosen wire, the C2 catheter was exchanged for a 105/18 cm multi side-hole EKOS ultrasound assisted infusion catheter. Next, a Bentson wire was utilized to advance a C2 catheter into the right pulmonary artery. Limited contrast injection confirmed appropriate positioning. Over an exchange length Rosen wire, the C2 catheter was exchanged for a 105/12 cm multi side-hole EKOS ultrasound assisted infusion catheter. A postprocedural fluoroscopic image was obtained of the check demonstrating final catheter positioning. Both vascular sheath were secured at the right neck with an interrupted 0 Prolene suture. The external catheter tubing was secured at the right chest and the lytic therapy was initiated. The patient tolerated the procedure well without immediate postprocedural complication. FINDINGS: Limited pulmonary arteriogram was performed for location documentation purposes. Acquired pressure measurements: Main pulmonary artery - 46/19 (mean - 39) (normal: < 25/10) Following the procedure, both ultrasound assisted infusion catheter tips terminate within the distal aspects of the bilateral lower lobe sub segmental pulmonary arteries. IMPRESSION: 1. Successful fluoroscopic guided initiation of bilateral ultrasound assisted catheter directed pulmonary arterial lysis for sub massive pulmonary embolism and right-sided heart strain. 2. Markedly elevated pressure measurements within the main pulmonary artery compatible with critical pulmonary arterial hypertension. PLAN: The patient will return to the interventional radiology suite following 12 hours bilateral pulmonary arterial catheter directed thrombolysis for repeat pressure measurements as well as potential placement of an IVC filter. Electronically Signed   By: Sandi Mariscal M.D.   On: 12/03/2017 16:10   Ir Angiogram Selective Each  Additional Vessel  Result Date: 12/03/2017 INDICATION: History of lower extremity  DVT post lower extremity catheter directed thrombolysis, now with sub massive pulmonary embolism and CT/echo findings worrisome for right-sided heart failure. As such, request made for bilateral pulmonary arterial ultrasound assisted catheter directed thrombolysis as well as potential placement of an IVC filter. EXAM: 1. ULTRASOUND GUIDANCE FOR VENOUS ACCESS X2 2. BILATERAL PULMONARY ARTERIOGRAPHY 3. FLUOROSCOPIC GUIDED PLACEMENT OF BILATERAL PULMONARY ARTERIAL LYTIC INFUSION CATHETERS COMPARISON:  Chest CTA - 12/02/2017 MEDICATIONS: None ANESTHESIA/SEDATION: Moderate (conscious) sedation was employed during this procedure. A total of Versed 4 mg, Dilaudid 0.5 mg and Fentanyl 150 mcg was administered intravenously. Moderate Sedation Time: 39 minutes. The patient's level of consciousness and vital signs were monitored continuously by radiology nursing throughout the procedure under my direct supervision. CONTRAST:  15 cc Isovue 300 FLUOROSCOPY TIME:  4 minutes 54 seconds (712 mGy) COMPLICATIONS: None immediate. TECHNIQUE: Informed written consent was obtained from the patient after a discussion of the risks, benefits and alternatives to treatment. Questions regarding the procedure were encouraged and answered. A timeout was performed prior to the initiation of the procedure. Given the presence of lower extremity DVT, the decision was made to perform the procedure via right internal jugular approach. As such, the right neck was prepped and draped in the usual sterile fashion, and a sterile drape was applied covering the operative field. Maximum barrier sterile technique with sterile gowns and gloves were used for the procedure. A timeout was performed prior to the initiation of the procedure. Local anesthesia was provided with 1% lidocaine. Under direct ultrasound guidance, the right internal jugular vein was accessed with a micro  puncture sheath ultimately allowing placement of a 6 French vascular sheath. Slightly cranial to this initial access, the right internal jugular vein was again accessed with a micropuncture sheath ultimately allowing placement of an additional 6 French vascular sheath. With the use of a Bentson wire, a C2 catheter was advanced into the main pulmonary artery and a limited pulmonary arteriogram was performed with a hand injection. Pressure measurements were then obtained from the level of the main pulmonary artery. With use of a Bentson wire, the C2 catheter was advanced into the left pulmonary artery. Limited contrast injection confirmed appropriate positioning. Over an exchange length Rosen wire, the C2 catheter was exchanged for a 105/18 cm multi side-hole EKOS ultrasound assisted infusion catheter. Next, a Bentson wire was utilized to advance a C2 catheter into the right pulmonary artery. Limited contrast injection confirmed appropriate positioning. Over an exchange length Rosen wire, the C2 catheter was exchanged for a 105/12 cm multi side-hole EKOS ultrasound assisted infusion catheter. A postprocedural fluoroscopic image was obtained of the check demonstrating final catheter positioning. Both vascular sheath were secured at the right neck with an interrupted 0 Prolene suture. The external catheter tubing was secured at the right chest and the lytic therapy was initiated. The patient tolerated the procedure well without immediate postprocedural complication. FINDINGS: Limited pulmonary arteriogram was performed for location documentation purposes. Acquired pressure measurements: Main pulmonary artery - 46/19 (mean - 39) (normal: < 25/10) Following the procedure, both ultrasound assisted infusion catheter tips terminate within the distal aspects of the bilateral lower lobe sub segmental pulmonary arteries. IMPRESSION: 1. Successful fluoroscopic guided initiation of bilateral ultrasound assisted catheter directed  pulmonary arterial lysis for sub massive pulmonary embolism and right-sided heart strain. 2. Markedly elevated pressure measurements within the main pulmonary artery compatible with critical pulmonary arterial hypertension. PLAN: The patient will return to the interventional radiology suite following 12 hours bilateral pulmonary arterial catheter  directed thrombolysis for repeat pressure measurements as well as potential placement of an IVC filter. Electronically Signed   By: Sandi Mariscal M.D.   On: 12/03/2017 16:10   Ir Ivc Filter Plmt / S&i /img Guid/mod Sed  Result Date: 12/04/2017 INDICATION: History of right lower extremity DVT, post lower extremity catheter directed thrombolysis with postprocedural development of sub massive pulmonary embolism and CT/echo findings worrisome for right-sided heart strain. Post initiation of bilateral pulmonary arterial ultrasound assisted catheter directed thrombolysis on 12/03/2017 Request has also been made for placement of an IVC filter for temporary caval interruption purposes given presence of persistent DVT. EXAM: 1. IR PULMONARY ARTERIAL LYSIS, SUBSEQUENT; FLUOROSCOPIC GUIDED PULMONARY ARTERIAL PRESSURE MEASUREMENT ACQUISITION 2. IVC CATHETERIZATION AND VENOGRAM 3. IVC FILTER INSERTION COMPARISON:  Initiation of bilateral ultrasound assisted pulmonary arterial thrombolysis - 12/03/2017; chest CT - 12/02/2017; CT abdomen and pelvis - 12/02/2017 MEDICATIONS: None. ANESTHESIA/SEDATION: Dilaudid 2 mg IV; Versed 1 mg IV Sedation Time: 23 minutes; The patient was continuously monitored during the procedure by the interventional radiology nurse under my direct supervision. CONTRAST:  50 cc Isovue-300 FLUOROSCOPY TIME:  2 Minutes 30 seconds (888 mGy) COMPLICATIONS: None immediate PROCEDURE: Patient was placed supine on the fluoroscopy table. Preprocedural spot fluoroscopic image was obtained of the chest and existing bilateral pulmonary arterial lytic infusion catheters. The  introducer wire from the left pulmonary arterial infusion catheter was removed and the tip of the infusion catheter was retracted to the level of the main pulmonary artery where pressure measurements were obtained. At this time, both infusion catheters removed. The external portion of the adjacent right internal jugular vein vascular sheath as well as the surrounding skin were prepped and draped in usual sterile fashion. Maximal barrier sterile technique utilized including caps, mask, sterile gowns, sterile gloves, large sterile drape, hand hygiene, and Betadine prep. The more caudal vascular sheath was cannulated with a Kumpe the catheter which was utilized to advance a Bentson wire to the level of the IVC. Over the Bentson wire, a pigtail catheter was advanced into the caudal aspect of the abdominal aorta where a inferior vena cavagram was performed. Next, the vascular sheath was exchanged for the IVC filter delivery sheath and inner dilator. Through the delivery sheath, a retrievable Denali IVC filter was deployed below the level of the renal veins and above the IVC bifurcation. Limited post deployment venacavagram was performed. The delivery sheath as well as the adjacent vascular sheath were both removed and hemostasis was obtained with manual compression. A dressing was placed. The patient tolerated the procedure well without immediate post procedural complication. FINDINGS: Preprocedural spot fluoroscopic image demonstrates unchanged positioning of the bilateral pulmonary arterial lytic infusion catheters. Significant reduction in main pulmonary pressure with measurements as follows: Preprocedural pressure measurements - 46/19 (mean - 39) Postprocedural pressure measurements - 47/12 (mean - 29) The IVC is patent. No evidence of thrombus, stenosis, or occlusion. No variant venous anatomy. Successful placement of the IVC filter below the level of the renal veins. IMPRESSION: 1. Technically successful completion  of 12 hour bilateral ultrasound assisted pulmonary arterial thrombolysis with reduction in the main PA pressure by 10 mmHg. 2. Successful ultrasound and fluoroscopically guided placement of an infrarenal retrievable IVC filter via right jugular approach. PLAN: This IVC filter is potentially retrievable. The patient will be assessed for filter retrieval by Interventional Radiology in approximately 8-12 weeks. Further recommendations regarding filter retrieval, continued surveillance or declaration of device permanence, will be made at that time. Electronically Signed  By: Sandi Mariscal M.D.   On: 12/04/2017 10:53   Ir US Guide Vasc Access Right  Result Date: 12/03/2017 INDICATION: History of lower extremity DVT post lower extremity catheter directed thrombolysis, now with sub massive pulmonary embolism and CT/echo findings worrisome for right-sided heart failure. As such, request made for bilateral pulmonary arterial ultrasound assisted catheter directed thrombolysis as well as potential placement of an IVC filter. EXAM: 1. ULTRASOUND GUIDANCE FOR VENOUS ACCESS X2 2. BILATERAL PULMONARY ARTERIOGRAPHY 3. FLUOROSCOPIC GUIDED PLACEMENT OF BILATERAL PULMONARY ARTERIAL LYTIC INFUSION CATHETERS COMPARISON:  Chest CTA - 12/02/2017 MEDICATIONS: None ANESTHESIA/SEDATION: Moderate (conscious) sedation was employed during this procedure. A total of Versed 4 mg, Dilaudid 0.5 mg and Fentanyl 150 mcg was administered intravenously. Moderate Sedation Time: 39 minutes. The patient's level of consciousness and vital signs were monitored continuously by radiology nursing throughout the procedure under my direct supervision. CONTRAST:  15 cc Isovue 300 FLUOROSCOPY TIME:  4 minutes 54 seconds (250 mGy) COMPLICATIONS: None immediate. TECHNIQUE: Informed written consent was obtained from the patient after a discussion of the risks, benefits and alternatives to treatment. Questions regarding the procedure were encouraged and answered. A  timeout was performed prior to the initiation of the procedure. Given the presence of lower extremity DVT, the decision was made to perform the procedure via right internal jugular approach. As such, the right neck was prepped and draped in the usual sterile fashion, and a sterile drape was applied covering the operative field. Maximum barrier sterile technique with sterile gowns and gloves were used for the procedure. A timeout was performed prior to the initiation of the procedure. Local anesthesia was provided with 1% lidocaine. Under direct ultrasound guidance, the right internal jugular vein was accessed with a micro puncture sheath ultimately allowing placement of a 6 French vascular sheath. Slightly cranial to this initial access, the right internal jugular vein was again accessed with a micropuncture sheath ultimately allowing placement of an additional 6 French vascular sheath. With the use of a Bentson wire, a C2 catheter was advanced into the main pulmonary artery and a limited pulmonary arteriogram was performed with a hand injection. Pressure measurements were then obtained from the level of the main pulmonary artery. With use of a Bentson wire, the C2 catheter was advanced into the left pulmonary artery. Limited contrast injection confirmed appropriate positioning. Over an exchange length Rosen wire, the C2 catheter was exchanged for a 105/18 cm multi side-hole EKOS ultrasound assisted infusion catheter. Next, a Bentson wire was utilized to advance a C2 catheter into the right pulmonary artery. Limited contrast injection confirmed appropriate positioning. Over an exchange length Rosen wire, the C2 catheter was exchanged for a 105/12 cm multi side-hole EKOS ultrasound assisted infusion catheter. A postprocedural fluoroscopic image was obtained of the check demonstrating final catheter positioning. Both vascular sheath were secured at the right neck with an interrupted 0 Prolene suture. The external  catheter tubing was secured at the right chest and the lytic therapy was initiated. The patient tolerated the procedure well without immediate postprocedural complication. FINDINGS: Limited pulmonary arteriogram was performed for location documentation purposes. Acquired pressure measurements: Main pulmonary artery - 46/19 (mean - 39) (normal: < 25/10) Following the procedure, both ultrasound assisted infusion catheter tips terminate within the distal aspects of the bilateral lower lobe sub segmental pulmonary arteries. IMPRESSION: 1. Successful fluoroscopic guided initiation of bilateral ultrasound assisted catheter directed pulmonary arterial lysis for sub massive pulmonary embolism and right-sided heart strain. 2. Markedly elevated pressure measurements within  the main pulmonary artery compatible with critical pulmonary arterial hypertension. PLAN: The patient will return to the interventional radiology suite following 12 hours bilateral pulmonary arterial catheter directed thrombolysis for repeat pressure measurements as well as potential placement of an IVC filter. Electronically Signed   By: Sandi Mariscal M.D.   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 As such, request made for bilateral pulmonary arterial ultrasound assisted catheter directed thrombolysis as well as potential placement of an IVC filter. EXAM: 1. ULTRASOUND GUIDANCE FOR VENOUS ACCESS X2 2. BILATERAL PULMONARY ARTERIOGRAPHY 3. FLUOROSCOPIC GUIDED PLACEMENT OF BILATERAL PULMONARY ARTERIAL LYTIC INFUSION CATHETERS COMPARISON:  Chest CTA - 12/02/2017 MEDICATIONS: None ANESTHESIA/SEDATION: Moderate (conscious) sedation was employed during this procedure. A total of Versed 4 mg, Dilaudid 0.5 mg and  Fentanyl 150 mcg was administered intravenously. Moderate Sedation Time: 39 minutes. The patient's level of consciousness and vital signs were monitored continuously by radiology nursing throughout the procedure under my direct supervision. CONTRAST:  15 cc Isovue 300 FLUOROSCOPY TIME:  4 minutes 54 seconds (366 mGy) COMPLICATIONS: None immediate. TECHNIQUE: Informed written consent was obtained from the patient after a discussion of the risks, benefits and alternatives to treatment. Questions regarding the procedure were encouraged and answered. A timeout was performed prior to the initiation of the procedure. Given the presence of lower extremity DVT, the decision was made to perform the procedure via right internal jugular approach. As such, the right neck was prepped and draped in the usual sterile fashion, and a sterile drape was applied covering the operative field. Maximum barrier sterile technique with sterile gowns and gloves were used for the procedure. A timeout was performed prior to the initiation of the procedure. Local anesthesia was provided with 1% lidocaine. Under direct ultrasound guidance, the right internal jugular vein was accessed with a micro puncture sheath ultimately allowing placement of a 6 French vascular sheath. Slightly cranial to this initial access, the right internal jugular vein was again accessed with a micropuncture sheath ultimately allowing placement of an additional 6 French vascular sheath. With the use of a Bentson wire, a C2 catheter was advanced into the main pulmonary artery and a limited pulmonary arteriogram was performed with a hand injection. Pressure measurements were then obtained from the level of the main pulmonary artery. With use of a Bentson wire, the C2 catheter was advanced into the left pulmonary artery. Limited contrast injection confirmed appropriate positioning. Over an exchange length Rosen wire, the C2 catheter was exchanged for a 105/18 cm multi  side-hole EKOS ultrasound assisted infusion catheter. Next, a Bentson wire was utilized to advance a C2 catheter into the right pulmonary artery. Limited contrast injection confirmed appropriate positioning. Over an exchange length Rosen wire, the C2 catheter was exchanged for a 105/12 cm multi side-hole EKOS ultrasound assisted infusion catheter. A postprocedural fluoroscopic image was obtained of the check demonstrating final catheter positioning. Both vascular sheath were secured at the right neck with an interrupted 0 Prolene suture. The external catheter tubing was secured at the right chest and the lytic therapy was initiated. The patient tolerated the procedure well without immediate postprocedural complication. FINDINGS: Limited pulmonary arteriogram was performed for location documentation purposes. Acquired pressure measurements: Main pulmonary artery - 46/19 (mean - 39) (normal: < 25/10) Following the procedure, both ultrasound assisted infusion  catheter tips terminate within the distal aspects of the bilateral lower lobe sub segmental pulmonary arteries. IMPRESSION: 1. Successful fluoroscopic guided initiation of bilateral ultrasound assisted catheter directed pulmonary arterial lysis for sub massive pulmonary embolism and right-sided heart strain. 2. Markedly elevated pressure measurements within the main pulmonary artery compatible with critical pulmonary arterial hypertension. PLAN: The patient will return to the interventional radiology suite following 12 hours bilateral pulmonary arterial catheter directed thrombolysis for repeat pressure measurements as well as potential placement of an IVC filter. Electronically Signed   By: Sandi Mariscal M.D.   On: 12/03/2017 16:10   Ir Infusion Thrombol Arterial Initial (ms)  Result Date: 12/03/2017 INDICATION: History of lower extremity DVT post lower extremity catheter directed thrombolysis, now with sub massive pulmonary embolism and CT/echo findings  worrisome for right-sided heart failure. As such, request made for bilateral pulmonary arterial ultrasound assisted catheter directed thrombolysis as well as potential placement of an IVC filter. EXAM: 1. ULTRASOUND GUIDANCE FOR VENOUS ACCESS X2 2. BILATERAL PULMONARY ARTERIOGRAPHY 3. FLUOROSCOPIC GUIDED PLACEMENT OF BILATERAL PULMONARY ARTERIAL LYTIC INFUSION CATHETERS COMPARISON:  Chest CTA - 12/02/2017 MEDICATIONS: None ANESTHESIA/SEDATION: Moderate (conscious) sedation was employed during this procedure. A total of Versed 4 mg, Dilaudid 0.5 mg and Fentanyl 150 mcg was administered intravenously. Moderate Sedation Time: 39 minutes. The patient's level of consciousness and vital signs were monitored continuously by radiology nursing throughout the procedure under my direct supervision. CONTRAST:  15 cc Isovue 300 FLUOROSCOPY TIME:  4 minutes 54 seconds (035 mGy) COMPLICATIONS: None immediate. TECHNIQUE: Informed written consent was obtained from the patient after a discussion of the risks, benefits and alternatives to treatment. Questions regarding the procedure were encouraged and answered. A timeout was performed prior to the initiation of the procedure. Given the presence of lower extremity DVT, the decision was made to perform the procedure via right internal jugular approach. As such, the right neck was prepped and draped in the usual sterile fashion, and a sterile drape was applied covering the operative field. Maximum barrier sterile technique with sterile gowns and gloves were used for the procedure. A timeout was performed prior to the initiation of the procedure. Local anesthesia was provided with 1% lidocaine. Under direct ultrasound guidance, the right internal jugular vein was accessed with a micro puncture sheath ultimately allowing placement of a 6 French vascular sheath. Slightly cranial to this initial access, the right internal jugular vein was again accessed with a micropuncture sheath  ultimately allowing placement of an additional 6 French vascular sheath. With the use of a Bentson wire, a C2 catheter was advanced into the main pulmonary artery and a limited pulmonary arteriogram was performed with a hand injection. Pressure measurements were then obtained from the level of the main pulmonary artery. With use of a Bentson wire, the C2 catheter was advanced into the left pulmonary artery. Limited contrast injection confirmed appropriate positioning. Over an exchange length Rosen wire, the C2 catheter was exchanged for a 105/18 cm multi side-hole EKOS ultrasound assisted infusion catheter. Next, a Bentson wire was utilized to advance a C2 catheter into the right pulmonary artery. Limited contrast injection confirmed appropriate positioning. Over an exchange length Rosen wire, the C2 catheter was exchanged for a 105/12 cm multi side-hole EKOS ultrasound assisted infusion catheter. A postprocedural fluoroscopic image was obtained of the check demonstrating final catheter positioning. Both vascular sheath were secured at the right neck with an interrupted 0 Prolene suture. The external catheter tubing was secured at the right chest and  the lytic therapy was initiated. The patient tolerated the procedure well without immediate postprocedural complication. FINDINGS: Limited pulmonary arteriogram was performed for location documentation purposes. Acquired pressure measurements: Main pulmonary artery - 46/19 (mean - 39) (normal: < 25/10) Following the procedure, both ultrasound assisted infusion catheter tips terminate within the distal aspects of the bilateral lower lobe sub segmental pulmonary arteries. IMPRESSION: 1. Successful fluoroscopic guided initiation of bilateral ultrasound assisted catheter directed pulmonary arterial lysis for sub massive pulmonary embolism and right-sided heart strain. 2. Markedly elevated pressure measurements within the main pulmonary artery compatible with critical  pulmonary arterial hypertension. PLAN: The patient will return to the interventional radiology suite following 12 hours bilateral pulmonary arterial catheter directed thrombolysis for repeat pressure measurements as well as potential placement of an IVC filter. Electronically Signed   By: Sandi Mariscal M.D.   On: 12/03/2017 16:10   Ir Jacolyn Reedy F/u Eval Art/ven Final Day (ms)  Result Date: 12/04/2017 INDICATION: History of right lower extremity DVT, post lower extremity catheter directed thrombolysis with postprocedural development of sub massive pulmonary embolism and CT/echo findings worrisome for right-sided heart strain. Post initiation of bilateral pulmonary arterial ultrasound assisted catheter directed thrombolysis on 12/03/2017 Request has also been made for placement of an IVC filter for temporary caval interruption purposes given presence of persistent DVT. EXAM: 1. IR PULMONARY ARTERIAL LYSIS, SUBSEQUENT; FLUOROSCOPIC GUIDED PULMONARY ARTERIAL PRESSURE MEASUREMENT ACQUISITION 2. IVC CATHETERIZATION AND VENOGRAM 3. IVC FILTER INSERTION COMPARISON:  Initiation of bilateral ultrasound assisted pulmonary arterial thrombolysis - 12/03/2017; chest CT - 12/02/2017; CT abdomen and pelvis - 12/02/2017 MEDICATIONS: None. ANESTHESIA/SEDATION: Dilaudid 2 mg IV; Versed 1 mg IV Sedation Time: 23 minutes; The patient was continuously monitored during the procedure by the interventional radiology nurse under my direct supervision. CONTRAST:  50 cc Isovue-300 FLUOROSCOPY TIME:  2 Minutes 30 seconds (675 mGy) COMPLICATIONS: None immediate PROCEDURE: Patient was placed supine on the fluoroscopy table. Preprocedural spot fluoroscopic image was obtained of the chest and existing bilateral pulmonary arterial lytic infusion catheters. The introducer wire from the left pulmonary arterial infusion catheter was removed and the tip of the infusion catheter was retracted to the level of the main pulmonary artery where pressure  measurements were obtained. At this time, both infusion catheters removed. The external portion of the adjacent right internal jugular vein vascular sheath as well as the surrounding skin were prepped and draped in usual sterile fashion. Maximal barrier sterile technique utilized including caps, mask, sterile gowns, sterile gloves, large sterile drape, hand hygiene, and Betadine prep. The more caudal vascular sheath was cannulated with a Kumpe the catheter which was utilized to advance a Bentson wire to the level of the IVC. Over the Bentson wire, a pigtail catheter was advanced into the caudal aspect of the abdominal aorta where a inferior vena cavagram was performed. Next, the vascular sheath was exchanged for the IVC filter delivery sheath and inner dilator. Through the delivery sheath, a retrievable Denali IVC filter was deployed below the level of the renal veins and above the IVC bifurcation. Limited post deployment venacavagram was performed. The delivery sheath as well as the adjacent vascular sheath were both removed and hemostasis was obtained with manual compression. A dressing was placed. The patient tolerated the procedure well without immediate post procedural complication. FINDINGS: Preprocedural spot fluoroscopic image demonstrates unchanged positioning of the bilateral pulmonary arterial lytic infusion catheters. Significant reduction in main pulmonary pressure with measurements as follows: Preprocedural pressure measurements - 46/19 (mean - 39) Postprocedural pressure measurements -  47/12 (mean - 29) The IVC is patent. No evidence of thrombus, stenosis, or occlusion. No variant venous anatomy. Successful placement of the IVC filter below the level of the renal veins. IMPRESSION: 1. Technically successful completion of 12 hour bilateral ultrasound assisted pulmonary arterial thrombolysis with reduction in the main PA pressure by 10 mmHg. 2. Successful ultrasound and fluoroscopically guided placement  of an infrarenal retrievable IVC filter via right jugular approach. PLAN: This IVC filter is potentially retrievable. The patient will be assessed for filter retrieval by Interventional Radiology in approximately 8-12 weeks. Further recommendations regarding filter retrieval, continued surveillance or declaration of device permanence, will be made at that time. Electronically Signed   By: Sandi Mariscal M.D.   On: 12/04/2017 10:53    ASSESSMENT / PLAN: LE DVT s/p mechanical thrombectomy and lysis Acute submassive PE - s/p EKOS and IVC filter placement 2/3 Acute hypoxemic respiratory failure - due to above  She has now had IVC filter placed and has undergone EKOS for targeted lysis of her PE. Well tolerated and dyspnea improved. She will need a repeat TTE in several months to look at RV fxn. We will continue to wean her O2 as able to support an O2 saturation of > 92%.  Will also give 26m lasix now and assess her response given that she is +15L net. ontinue heparin gtt for now. Defer to vascular sgy with regard to duration of heparin and when ok to change to PO anticoagulation. Also will need to determine with Vascular when IVC filter can be removed.    Nothing further to add from PCCM standpoint.  PCCM will sign off.  Please do not hesitate to call [email or web address] if we can be of any further assistance.   RMontey Hora PUniversity CityPulmonary & Critical Care Medicine Pager: [phone] or ([phone]/01/2018, 10:30 AM

## 2017-12-05 NOTE — Progress Notes (Signed)
Referring Physician(s): [name]  Supervising Physician: Jacqulynn Cadet  Patient Status:  [facility] - In-pt  Chief Complaint: Pulmonary embolus  Subjective: Resting comfortably.  Questions about IVC filter.   Allergies: Ibuprofen; Morphine and related; Prednisone; and Adhesive [tape]  Medications: Prior to Admission medications   Medication Sig Start Date End Date Taking? Authorizing Provider  acetaminophen (TYLENOL) 500 MG tablet Take 1,000 mg by mouth every 6 (six) hours as needed for headache (pain). Reported on 12/15/2015   Yes [provider]  aspirin 325 MG tablet Take 325 mg by mouth daily.   Yes [provider]  azaTHIOprine (IMURAN) 50 MG tablet Take 4 tablets by mouth every day Patient taking differently: Take 200 mg by mouth daily.  10/17/17  Yes Gatha Mayer, MD  buPROPion (WELLBUTRIN SR) 100 MG 12 hr tablet TAKE 1 TABLET BY MOUTH TWICE A DAY Patient taking differently: TAKE 1 TABLET (100 MG) BY MOUTH TWICE A DAY 11/15/17  Yes Rita Ohara, MD  cetirizine (ZYRTEC) 10 MG tablet Take 10 mg by mouth daily.   Yes [provider]  cyanocobalamin (,VITAMIN B-12,) 1000 MCG/ML injection INJECT AS DIRECTED EVERY 30 DAYS Patient taking differently: INJECT 1 ML (1000 MCG) INTRAMUSCULARLY EVERY 30 DAYS 03/03/17  Yes Gatha Mayer, MD  diphenhydrAMINE (SOMINEX) 25 MG tablet Take 25 mg by mouth at bedtime.    Yes [provider]  escitalopram (LEXAPRO) 10 MG tablet TAKE 1 TABLET (10 MG TOTAL) BY MOUTH DAILY. 10/17/17  Yes Rita Ohara, MD  fenofibrate 160 MG tablet Take 1 tablet (160 mg total) by mouth daily. Patient taking differently: Take 160 mg by mouth at bedtime.  01/17/17  Yes Rita Ohara, MD  ferrous sulfate 325 (65 FE) MG tablet Take 1-2 tablets daily with food - at least 1x/day Patient taking differently: Take 325 mg by mouth 2 (two) times daily with a meal.  10/05/17  Yes Gatha Mayer, MD  levonorgestrel (MIRENA) 20 MCG/24HR  IUD 1 each by Intrauterine route once. Implanted April 2017 05/19/11  Yes [provider]  Menthol, Topical Analgesic, (BIOFREEZE EX) Apply 1 application topically 3 (three) times daily as needed (pain).   Yes [provider]  Omega-3 Fatty Acids (FISH OIL) 1200 MG CAPS Take 1,200 mg by mouth 2 (two) times daily.   Yes [provider]  traMADol (ULTRAM) 50 MG tablet Take 50 mg by mouth daily as needed (pain).   Yes [provider]  Vedolizumab (ENTYVIO IV) Inject into the vein See admin instructions. Administer once every 8 weeks, ordered by Dr. Carlean Purl (last injection 10/31/17)   Yes [provider]  azaTHIOprine (IMURAN) 50 MG tablet Take 4 tablets (200 mg total) by mouth daily. Patient not taking: Reported on 11/27/2017 10/05/17   Gatha Mayer, MD  celecoxib (CELEBREX) 200 MG capsule Take 1 capsule (200 mg total) by mouth 2 (two) times daily as needed. Patient not taking: Reported on 11/27/2017 05/11/17   Gatha Mayer, MD     Vital Signs: BP 121/89   Pulse 96   Temp 97.9 F (36.6 C) (Oral)   Resp 18   Ht 5' 4"  (1.626 m)   Wt 220 lb (99.8 kg)   SpO2 96%   BMI 37.76 kg/m   Physical Exam  NAD, alert Chest:  Lungs clear to auscultation, effort normal.  Skin:  Dressing in place to right neck.  Clean, dry, intact.   Imaging: Ct Angio Chest Pe W Or Wo  Contrast  Result Date: 12/02/2017 CLINICAL DATA:  High pretest probability of pulmonary emboli. Mid back pain, shortness of breath. EXAM: CT ANGIOGRAPHY CHEST CT ABDOMEN AND PELVIS WITH CONTRAST TECHNIQUE: Multidetector CT imaging of the chest was performed using the standard protocol during bolus administration of intravenous contrast. Multiplanar CT image reconstructions and MIPs were obtained to evaluate the vascular anatomy. Multidetector CT imaging of the abdomen and pelvis was performed using the standard protocol during bolus administration of intravenous contrast. CONTRAST:  186m  ISOVUE-370 IOPAMIDOL (ISOVUE-370) INJECTION 76% COMPARISON:  11/27/2017 FINDINGS: CTA CHEST FINDINGS Cardiovascular: Mild right atrial enlargement. Dilated right ventricle, RV/LV ratio 1.5. Large partially occlusive acute pulmonary embolus in the right pulmonary artery extending into the upper and lower lobe segmental branches. 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 Adequate contrast opacification of the thoracic aorta with no evidence of dissection, aneurysm, or stenosis. There is classic 3-vessel brachiocephalic arch anatomy without proximal stenosis. No significant atheromatous irregularity. Mediastinum/Nodes: No enlarged mediastinal, hilar, or axillary lymph nodes. Thyroid gland, trachea, and esophagus demonstrate no significant findings. Lungs/Pleura: Stable presumed bronchocele in the lateral basal segment left lower lobe. New interstitial infiltrate in the left upper lobe suprahilar region. Right lung clear. No pneumothorax. No pleural effusion. Musculoskeletal: No chest wall abnormality. No acute or significant osseous findings. Review of the MIP images confirms the above findings. CT ABDOMEN and PELVIS FINDINGS Hepatobiliary: No focal liver abnormality is seen. No gallstones, gallbladder wall thickening, or biliary dilatation. Pancreas: Unremarkable. No pancreatic ductal dilatation or surrounding inflammatory changes. Spleen: Normal in size without focal abnormality. Adrenals/Urinary Tract: Normal adrenals. 0.8 cm probable cyst, upper pole left kidney. 5.2 cm cyst, lower pole right kidney. No hydronephrosis. Urinary bladder incompletely distended. Stomach/Bowel: Stomach is nondistended. Small bowel decompressed. Anastomotic staple line in the terminal ileum. The colon is nondilated. Vascular/Lymphatic: No significant arterial pathology evident. Partially occlusive  DVT in the right femoral, deep femoral, and common femoral veins. There is also some partially occlusive thrombus in the right iliac vein just proximal to its confluence with the cava. IVC appears widely patent. Portal vein patent. No retroperitoneal hemorrhage. Reproductive: Uterus and bilateral adnexa are unremarkable. IUD in expected location. Other: No ascites.  No free air. Musculoskeletal: Edematous/inflammatory changes in the subcutaneous tissues of the visualized proximal right thigh. If regional bones unremarkable. Review of the MIP images confirms the above findings. IMPRESSION: 1. Positive for central partially occlusive right and segmental left lower lobe pulmonary emboli with CT evidence of right heart strain (RV/LV Ratio = 1.5) consistent with at least submassive (intermediate risk) PE. The presence of right heart strain has been associated with an increased risk of morbidity and mortality. Critical Value/emergent results were called by telephone at the time of interpretation on 12/02/2017 at 11:52 am to Dr. VHarold Barban, who verbally acknowledged these results. 2. Residual partially occlusive DVT in the right lower extremity and right iliac vein. No evidence of caval thrombus. Electronically Signed   By: DLucrezia EuropeM.D.   On: 12/02/2017 11:52   Ct Abdomen Pelvis W Contrast  Result Date: 12/02/2017 CLINICAL DATA:  High pretest probability of pulmonary emboli. Mid back pain, shortness of breath. EXAM: CT ANGIOGRAPHY CHEST CT ABDOMEN AND PELVIS WITH CONTRAST TECHNIQUE: Multidetector CT imaging of the chest was performed using the standard protocol during bolus administration of intravenous contrast. Multiplanar CT image reconstructions and MIPs were  obtained to evaluate the vascular anatomy. Multidetector CT imaging of the abdomen and pelvis was performed using the standard protocol during bolus administration of intravenous contrast. CONTRAST:  149m ISOVUE-370 IOPAMIDOL (ISOVUE-370) INJECTION 76%  COMPARISON:  11/27/2017 FINDINGS: CTA CHEST FINDINGS Cardiovascular: Mild right atrial enlargement. Dilated right ventricle, RV/LV ratio 1.5. Large partially occlusive acute pulmonary embolus in the right pulmonary artery extending into the upper and lower lobe segmental branches. 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 Adequate contrast opacification of the thoracic aorta with no evidence of dissection, aneurysm, or stenosis. There is classic 3-vessel brachiocephalic arch anatomy without proximal stenosis. No significant atheromatous irregularity. Mediastinum/Nodes: No enlarged mediastinal, hilar, or axillary lymph nodes. Thyroid gland, trachea, and esophagus demonstrate no significant findings. Lungs/Pleura: Stable presumed bronchocele in the lateral basal segment left lower lobe. New interstitial infiltrate in the left upper lobe suprahilar region. Right lung clear. No pneumothorax. No pleural effusion. Musculoskeletal: No chest wall abnormality. No acute or significant osseous findings. Review of the MIP images confirms the above findings. CT ABDOMEN and PELVIS FINDINGS Hepatobiliary: No focal liver abnormality is seen. No gallstones, gallbladder wall thickening, or biliary dilatation. Pancreas: Unremarkable. No pancreatic ductal dilatation or surrounding inflammatory changes. Spleen: Normal in size without focal abnormality. Adrenals/Urinary Tract: Normal adrenals. 0.8 cm probable cyst, upper pole left kidney. 5.2 cm cyst, lower pole right kidney. No hydronephrosis. Urinary bladder incompletely distended. Stomach/Bowel: Stomach is nondistended. Small bowel decompressed. Anastomotic staple line in the terminal ileum. The colon is nondilated. Vascular/Lymphatic: No significant arterial pathology evident. Partially occlusive DVT in the right femoral, deep femoral, and  common femoral veins. There is also some partially occlusive thrombus in the right iliac vein just proximal to its confluence with the cava. IVC appears widely patent. Portal vein patent. No retroperitoneal hemorrhage. Reproductive: Uterus and bilateral adnexa are unremarkable. IUD in expected location. Other: No ascites.  No free air. Musculoskeletal: Edematous/inflammatory changes in the subcutaneous tissues of the visualized proximal right thigh. If regional bones unremarkable. Review of the MIP images confirms the above findings. IMPRESSION: 1. Positive for central partially occlusive right and segmental left lower lobe pulmonary emboli with CT evidence of right heart strain (RV/LV Ratio = 1.5) consistent with at least submassive (intermediate risk) PE. The presence of right heart strain has been associated with an increased risk of morbidity and mortality. Critical Value/emergent results were called by telephone at the time of interpretation on 12/02/2017 at 11:52 am to Dr. VHarold Barban, who verbally acknowledged these results. 2. Residual partially occlusive DVT in the right lower extremity and right iliac vein. No evidence of caval thrombus. Electronically Signed   By: DLucrezia EuropeM.D.   On: 12/02/2017 11:52   Ir Angiogram Pulmonary Bilateral Selective  Result Date: 12/03/2017 INDICATION: History of lower extremity DVT post lower extremity catheter directed thrombolysis, now with sub massive pulmonary embolism and CT/echo findings worrisome for right-sided heart failure. As such, request made for bilateral pulmonary arterial ultrasound assisted catheter directed thrombolysis as well as potential placement of an IVC filter. EXAM: 1. ULTRASOUND GUIDANCE FOR VENOUS ACCESS X2 2. BILATERAL PULMONARY ARTERIOGRAPHY 3. FLUOROSCOPIC GUIDED PLACEMENT OF BILATERAL PULMONARY ARTERIAL LYTIC INFUSION CATHETERS COMPARISON:  Chest CTA - 12/02/2017 MEDICATIONS: None ANESTHESIA/SEDATION: Moderate (conscious) sedation was  employed during this procedure. A total of Versed 4 mg, Dilaudid 0.5 mg and Fentanyl 150 mcg was administered intravenously. Moderate Sedation  Time: 39 minutes. The patient's level of consciousness and vital signs were monitored continuously by radiology nursing throughout the procedure under my direct supervision. CONTRAST:  15 cc Isovue 300 FLUOROSCOPY TIME:  4 minutes 54 seconds (573 mGy) COMPLICATIONS: None immediate. TECHNIQUE: Informed written consent was obtained from the patient after a discussion of the risks, benefits and alternatives to treatment. Questions regarding the procedure were encouraged and answered. A timeout was performed prior to the initiation of the procedure. Given the presence of lower extremity DVT, the decision was made to perform the procedure via right internal jugular approach. As such, the right neck was prepped and draped in the usual sterile fashion, and a sterile drape was applied covering the operative field. Maximum barrier sterile technique with sterile gowns and gloves were used for the procedure. A timeout was performed prior to the initiation of the procedure. Local anesthesia was provided with 1% lidocaine. Under direct ultrasound guidance, the right internal jugular vein was accessed with a micro puncture sheath ultimately allowing placement of a 6 French vascular sheath. Slightly cranial to this initial access, the right internal jugular vein was again accessed with a micropuncture sheath ultimately allowing placement of an additional 6 French vascular sheath. With the use of a Bentson wire, a C2 catheter was advanced into the main pulmonary artery and a limited pulmonary arteriogram was performed with a hand injection. Pressure measurements were then obtained from the level of the main pulmonary artery. With use of a Bentson wire, the C2 catheter was advanced into the left pulmonary artery. Limited contrast injection confirmed appropriate positioning. Over an exchange  length Rosen wire, the C2 catheter was exchanged for a 105/18 cm multi side-hole EKOS ultrasound assisted infusion catheter. Next, a Bentson wire was utilized to advance a C2 catheter into the right pulmonary artery. Limited contrast injection confirmed appropriate positioning. Over an exchange length Rosen wire, the C2 catheter was exchanged for a 105/12 cm multi side-hole EKOS ultrasound assisted infusion catheter. A postprocedural fluoroscopic image was obtained of the check demonstrating final catheter positioning. Both vascular sheath were secured at the right neck with an interrupted 0 Prolene suture. The external catheter tubing was secured at the right chest and the lytic therapy was initiated. The patient tolerated the procedure well without immediate postprocedural complication. FINDINGS: Limited pulmonary arteriogram was performed for location documentation purposes. Acquired pressure measurements: Main pulmonary artery - 46/19 (mean - 39) (normal: < 25/10) Following the procedure, both ultrasound assisted infusion catheter tips terminate within the distal aspects of the bilateral lower lobe sub segmental pulmonary arteries. IMPRESSION: 1. Successful fluoroscopic guided initiation of bilateral ultrasound assisted catheter directed pulmonary arterial lysis for sub massive pulmonary embolism and right-sided heart strain. 2. Markedly elevated pressure measurements within the main pulmonary artery compatible with critical pulmonary arterial hypertension. PLAN: The patient will return to the interventional radiology suite following 12 hours bilateral pulmonary arterial catheter directed thrombolysis for repeat pressure measurements as well as potential placement of an IVC filter. Electronically Signed   By: Sandi Mariscal M.D.   On: 12/03/2017 16:10   Ir Angiogram Selective Each Additional Vessel  Result Date: 12/03/2017 INDICATION: History of lower extremity DVT post lower extremity catheter directed  thrombolysis, now with sub massive pulmonary embolism and CT/echo findings worrisome for right-sided heart failure. As such, request made for bilateral pulmonary arterial ultrasound assisted catheter directed thrombolysis as well as potential placement of an IVC filter. EXAM: 1. ULTRASOUND GUIDANCE FOR VENOUS ACCESS X2 2. BILATERAL PULMONARY ARTERIOGRAPHY  3. FLUOROSCOPIC GUIDED PLACEMENT OF BILATERAL PULMONARY ARTERIAL LYTIC INFUSION CATHETERS COMPARISON:  Chest CTA - 12/02/2017 MEDICATIONS: None ANESTHESIA/SEDATION: Moderate (conscious) sedation was employed during this procedure. A total of Versed 4 mg, Dilaudid 0.5 mg and Fentanyl 150 mcg was administered intravenously. Moderate Sedation Time: 39 minutes. The patient's level of consciousness and vital signs were monitored continuously by radiology nursing throughout the procedure under my direct supervision. CONTRAST:  15 cc Isovue 300 FLUOROSCOPY TIME:  4 minutes 54 seconds (381 mGy) COMPLICATIONS: None immediate. TECHNIQUE: Informed written consent was obtained from the patient after a discussion of the risks, benefits and alternatives to treatment. Questions regarding the procedure were encouraged and answered. A timeout was performed prior to the initiation of the procedure. Given the presence of lower extremity DVT, the decision was made to perform the procedure via right internal jugular approach. As such, the right neck was prepped and draped in the usual sterile fashion, and a sterile drape was applied covering the operative field. Maximum barrier sterile technique with sterile gowns and gloves were used for the procedure. A timeout was performed prior to the initiation of the procedure. Local anesthesia was provided with 1% lidocaine. Under direct ultrasound guidance, the right internal jugular vein was accessed with a micro puncture sheath ultimately allowing placement of a 6 French vascular sheath. Slightly cranial to this initial access, the right  internal jugular vein was again accessed with a micropuncture sheath ultimately allowing placement of an additional 6 French vascular sheath. With the use of a Bentson wire, a C2 catheter was advanced into the main pulmonary artery and a limited pulmonary arteriogram was performed with a hand injection. Pressure measurements were then obtained from the level of the main pulmonary artery. With use of a Bentson wire, the C2 catheter was advanced into the left pulmonary artery. Limited contrast injection confirmed appropriate positioning. Over an exchange length Rosen wire, the C2 catheter was exchanged for a 105/18 cm multi side-hole EKOS ultrasound assisted infusion catheter. Next, a Bentson wire was utilized to advance a C2 catheter into the right pulmonary artery. Limited contrast injection confirmed appropriate positioning. Over an exchange length Rosen wire, the C2 catheter was exchanged for a 105/12 cm multi side-hole EKOS ultrasound assisted infusion catheter. A postprocedural fluoroscopic image was obtained of the check demonstrating final catheter positioning. Both vascular sheath were secured at the right neck with an interrupted 0 Prolene suture. The external catheter tubing was secured at the right chest and the lytic therapy was initiated. The patient tolerated the procedure well without immediate postprocedural complication. FINDINGS: Limited pulmonary arteriogram was performed for location documentation purposes. Acquired pressure measurements: Main pulmonary artery - 46/19 (mean - 39) (normal: < 25/10) Following the procedure, both ultrasound assisted infusion catheter tips terminate within the distal aspects of the bilateral lower lobe sub segmental pulmonary arteries. IMPRESSION: 1. Successful fluoroscopic guided initiation of bilateral ultrasound assisted catheter directed pulmonary arterial lysis for sub massive pulmonary embolism and right-sided heart strain. 2. Markedly elevated pressure  measurements within the main pulmonary artery compatible with critical pulmonary arterial hypertension. PLAN: The patient will return to the interventional radiology suite following 12 hours bilateral pulmonary arterial catheter directed thrombolysis for repeat pressure measurements as well as potential placement of an IVC filter. Electronically Signed   By: Sandi Mariscal M.D.   On: 12/03/2017 16:10   Ir Angiogram Selective Each Additional Vessel  Result Date: 12/03/2017 INDICATION: History of lower extremity DVT post lower extremity catheter directed thrombolysis, now with  sub massive pulmonary embolism and CT/echo findings worrisome for right-sided heart failure. As such, request made for bilateral pulmonary arterial ultrasound assisted catheter directed thrombolysis as well as potential placement of an IVC filter. EXAM: 1. ULTRASOUND GUIDANCE FOR VENOUS ACCESS X2 2. BILATERAL PULMONARY ARTERIOGRAPHY 3. FLUOROSCOPIC GUIDED PLACEMENT OF BILATERAL PULMONARY ARTERIAL LYTIC INFUSION CATHETERS COMPARISON:  Chest CTA - 12/02/2017 MEDICATIONS: None ANESTHESIA/SEDATION: Moderate (conscious) sedation was employed during this procedure. A total of Versed 4 mg, Dilaudid 0.5 mg and Fentanyl 150 mcg was administered intravenously. Moderate Sedation Time: 39 minutes. The patient's level of consciousness and vital signs were monitored continuously by radiology nursing throughout the procedure under my direct supervision. CONTRAST:  15 cc Isovue 300 FLUOROSCOPY TIME:  4 minutes 54 seconds (580 mGy) COMPLICATIONS: None immediate. TECHNIQUE: Informed written consent was obtained from the patient after a discussion of the risks, benefits and alternatives to treatment. Questions regarding the procedure were encouraged and answered. A timeout was performed prior to the initiation of the procedure. Given the presence of lower extremity DVT, the decision was made to perform the procedure via right internal jugular approach. As such,  the right neck was prepped and draped in the usual sterile fashion, and a sterile drape was applied covering the operative field. Maximum barrier sterile technique with sterile gowns and gloves were used for the procedure. A timeout was performed prior to the initiation of the procedure. Local anesthesia was provided with 1% lidocaine. Under direct ultrasound guidance, the right internal jugular vein was accessed with a micro puncture sheath ultimately allowing placement of a 6 French vascular sheath. Slightly cranial to this initial access, the right internal jugular vein was again accessed with a micropuncture sheath ultimately allowing placement of an additional 6 French vascular sheath. With the use of a Bentson wire, a C2 catheter was advanced into the main pulmonary artery and a limited pulmonary arteriogram was performed with a hand injection. Pressure measurements were then obtained from the level of the main pulmonary artery. With use of a Bentson wire, the C2 catheter was advanced into the left pulmonary artery. Limited contrast injection confirmed appropriate positioning. Over an exchange length Rosen wire, the C2 catheter was exchanged for a 105/18 cm multi side-hole EKOS ultrasound assisted infusion catheter. Next, a Bentson wire was utilized to advance a C2 catheter into the right pulmonary artery. Limited contrast injection confirmed appropriate positioning. Over an exchange length Rosen wire, the C2 catheter was exchanged for a 105/12 cm multi side-hole EKOS ultrasound assisted infusion catheter. A postprocedural fluoroscopic image was obtained of the check demonstrating final catheter positioning. Both vascular sheath were secured at the right neck with an interrupted 0 Prolene suture. The external catheter tubing was secured at the right chest and the lytic therapy was initiated. The patient tolerated the procedure well without immediate postprocedural complication. FINDINGS: Limited pulmonary  arteriogram was performed for location documentation purposes. Acquired pressure measurements: Main pulmonary artery - 46/19 (mean - 39) (normal: < 25/10) Following the procedure, both ultrasound assisted infusion catheter tips terminate within the distal aspects of the bilateral lower lobe sub segmental pulmonary arteries. IMPRESSION: 1. Successful fluoroscopic guided initiation of bilateral ultrasound assisted catheter directed pulmonary arterial lysis for sub massive pulmonary embolism and right-sided heart strain. 2. Markedly elevated pressure measurements within the main pulmonary artery compatible with critical pulmonary arterial hypertension. PLAN: The patient will return to the interventional radiology suite following 12 hours bilateral pulmonary arterial catheter directed thrombolysis for repeat pressure measurements as well as  potential placement of an IVC filter. Electronically Signed   By: Sandi Mariscal M.D.   On: 12/03/2017 16:10   Ir Ivc Filter Plmt / S&i /img Guid/mod Sed  Result Date: 12/04/2017 INDICATION: History of right lower extremity DVT, post lower extremity catheter directed thrombolysis with postprocedural development of sub massive pulmonary embolism and CT/echo findings worrisome for right-sided heart strain. Post initiation of bilateral pulmonary arterial ultrasound assisted catheter directed thrombolysis on 12/03/2017 Request has also been made for placement of an IVC filter for temporary caval interruption purposes given presence of persistent DVT. EXAM: 1. IR PULMONARY ARTERIAL LYSIS, SUBSEQUENT; FLUOROSCOPIC GUIDED PULMONARY ARTERIAL PRESSURE MEASUREMENT ACQUISITION 2. IVC CATHETERIZATION AND VENOGRAM 3. IVC FILTER INSERTION COMPARISON:  Initiation of bilateral ultrasound assisted pulmonary arterial thrombolysis - 12/03/2017; chest CT - 12/02/2017; CT abdomen and pelvis - 12/02/2017 MEDICATIONS: None. ANESTHESIA/SEDATION: Dilaudid 2 mg IV; Versed 1 mg IV Sedation Time: 23 minutes;  The patient was continuously monitored during the procedure by the interventional radiology nurse under my direct supervision. CONTRAST:  50 cc Isovue-300 FLUOROSCOPY TIME:  2 Minutes 30 seconds (413 mGy) COMPLICATIONS: None immediate PROCEDURE: Patient was placed supine on the fluoroscopy table. Preprocedural spot fluoroscopic image was obtained of the chest and existing bilateral pulmonary arterial lytic infusion catheters. The introducer wire from the left pulmonary arterial infusion catheter was removed and the tip of the infusion catheter was retracted to the level of the main pulmonary artery where pressure measurements were obtained. At this time, both infusion catheters removed. The external portion of the adjacent right internal jugular vein vascular sheath as well as the surrounding skin were prepped and draped in usual sterile fashion. Maximal barrier sterile technique utilized including caps, mask, sterile gowns, sterile gloves, large sterile drape, hand hygiene, and Betadine prep. The more caudal vascular sheath was cannulated with a Kumpe the catheter which was utilized to advance a Bentson wire to the level of the IVC. Over the Bentson wire, a pigtail catheter was advanced into the caudal aspect of the abdominal aorta where a inferior vena cavagram was performed. Next, the vascular sheath was exchanged for the IVC filter delivery sheath and inner dilator. Through the delivery sheath, a retrievable Denali IVC filter was deployed below the level of the renal veins and above the IVC bifurcation. Limited post deployment venacavagram was performed. The delivery sheath as well as the adjacent vascular sheath were both removed and hemostasis was obtained with manual compression. A dressing was placed. The patient tolerated the procedure well without immediate post procedural complication. FINDINGS: Preprocedural spot fluoroscopic image demonstrates unchanged positioning of the bilateral pulmonary arterial  lytic infusion catheters. Significant reduction in main pulmonary pressure with measurements as follows: Preprocedural pressure measurements - 46/19 (mean - 39) Postprocedural pressure measurements - 47/12 (mean - 29) The IVC is patent. No evidence of thrombus, stenosis, or occlusion. No variant venous anatomy. Successful placement of the IVC filter below the level of the renal veins. IMPRESSION: 1. Technically successful completion of 12 hour bilateral ultrasound assisted pulmonary arterial thrombolysis with reduction in the main PA pressure by 10 mmHg. 2. Successful ultrasound and fluoroscopically guided placement of an infrarenal retrievable IVC filter via right jugular approach. PLAN: This IVC filter is potentially retrievable. The patient will be assessed for filter retrieval by Interventional Radiology in approximately 8-12 weeks. Further recommendations regarding filter retrieval, continued surveillance or declaration of device permanence, will be made at that time. Electronically Signed   By: Sandi Mariscal M.D.   On: 12/04/2017  10:53   Ir US Guide Vasc Access Right  Result Date: 12/03/2017 INDICATION: History of lower extremity DVT post lower extremity catheter directed thrombolysis, now with sub massive pulmonary embolism and CT/echo findings worrisome for right-sided heart failure. As such, request made for bilateral pulmonary arterial ultrasound assisted catheter directed thrombolysis as well as potential placement of an IVC filter. EXAM: 1. ULTRASOUND GUIDANCE FOR VENOUS ACCESS X2 2. BILATERAL PULMONARY ARTERIOGRAPHY 3. FLUOROSCOPIC GUIDED PLACEMENT OF BILATERAL PULMONARY ARTERIAL LYTIC INFUSION CATHETERS COMPARISON:  Chest CTA - 12/02/2017 MEDICATIONS: None ANESTHESIA/SEDATION: Moderate (conscious) sedation was employed during this procedure. A total of Versed 4 mg, Dilaudid 0.5 mg and Fentanyl 150 mcg was administered intravenously. Moderate Sedation Time: 39 minutes. The patient's level of  consciousness and vital signs were monitored continuously by radiology nursing throughout the procedure under my direct supervision. CONTRAST:  15 cc Isovue 300 FLUOROSCOPY TIME:  4 minutes 54 seconds (161 mGy) COMPLICATIONS: None immediate. TECHNIQUE: Informed written consent was obtained from the patient after a discussion of the risks, benefits and alternatives to treatment. Questions regarding the procedure were encouraged and answered. A timeout was performed prior to the initiation of the procedure. Given the presence of lower extremity DVT, the decision was made to perform the procedure via right internal jugular approach. As such, the right neck was prepped and draped in the usual sterile fashion, and a sterile drape was applied covering the operative field. Maximum barrier sterile technique with sterile gowns and gloves were used for the procedure. A timeout was performed prior to the initiation of the procedure. Local anesthesia was provided with 1% lidocaine. Under direct ultrasound guidance, the right internal jugular vein was accessed with a micro puncture sheath ultimately allowing placement of a 6 French vascular sheath. Slightly cranial to this initial access, the right internal jugular vein was again accessed with a micropuncture sheath ultimately allowing placement of an additional 6 French vascular sheath. With the use of a Bentson wire, a C2 catheter was advanced into the main pulmonary artery and a limited pulmonary arteriogram was performed with a hand injection. Pressure measurements were then obtained from the level of the main pulmonary artery. With use of a Bentson wire, the C2 catheter was advanced into the left pulmonary artery. Limited contrast injection confirmed appropriate positioning. Over an exchange length Rosen wire, the C2 catheter was exchanged for a 105/18 cm multi side-hole EKOS ultrasound assisted infusion catheter. Next, a Bentson wire was utilized to advance a C2 catheter  into the right pulmonary artery. Limited contrast injection confirmed appropriate positioning. Over an exchange length Rosen wire, the C2 catheter was exchanged for a 105/12 cm multi side-hole EKOS ultrasound assisted infusion catheter. A postprocedural fluoroscopic image was obtained of the check demonstrating final catheter positioning. Both vascular sheath were secured at the right neck with an interrupted 0 Prolene suture. The external catheter tubing was secured at the right chest and the lytic therapy was initiated. The patient tolerated the procedure well without immediate postprocedural complication. FINDINGS: Limited pulmonary arteriogram was performed for location documentation purposes. Acquired pressure measurements: Main pulmonary artery - 46/19 (mean - 39) (normal: < 25/10) Following the procedure, both ultrasound assisted infusion catheter tips terminate within the distal aspects of the bilateral lower lobe sub segmental pulmonary arteries. IMPRESSION: 1. Successful fluoroscopic guided initiation of bilateral ultrasound assisted catheter directed pulmonary arterial lysis for sub massive pulmonary embolism and right-sided heart strain. 2. Markedly elevated pressure measurements within the main pulmonary artery compatible with critical pulmonary arterial  hypertension. PLAN: The patient will return to the interventional radiology suite following 12 hours bilateral pulmonary arterial catheter directed thrombolysis for repeat pressure measurements as well as potential placement of an IVC filter. Electronically Signed   By: Sandi Mariscal M.D.   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 As such, request made for bilateral pulmonary arterial ultrasound assisted  catheter directed thrombolysis as well as potential placement of an IVC filter. EXAM: 1. ULTRASOUND GUIDANCE FOR VENOUS ACCESS X2 2. BILATERAL PULMONARY ARTERIOGRAPHY 3. FLUOROSCOPIC GUIDED PLACEMENT OF BILATERAL PULMONARY ARTERIAL LYTIC INFUSION CATHETERS COMPARISON:  Chest CTA - 12/02/2017 MEDICATIONS: None ANESTHESIA/SEDATION: Moderate (conscious) sedation was employed during this procedure. A total of Versed 4 mg, Dilaudid 0.5 mg and Fentanyl 150 mcg was administered intravenously. Moderate Sedation Time: 39 minutes. The patient's level of consciousness and vital signs were monitored continuously by radiology nursing throughout the procedure under my direct supervision. CONTRAST:  15 cc Isovue 300 FLUOROSCOPY TIME:  4 minutes 54 seconds (433 mGy) COMPLICATIONS: None immediate. TECHNIQUE: Informed written consent was obtained from the patient after a discussion of the risks, benefits and alternatives to treatment. Questions regarding the procedure were encouraged and answered. A timeout was performed prior to the initiation of the procedure. Given the presence of lower extremity DVT, the decision was made to perform the procedure via right internal jugular approach. As such, the right neck was prepped and draped in the usual sterile fashion, and a sterile drape was applied covering the operative field. Maximum barrier sterile technique with sterile gowns and gloves were used for the procedure. A timeout was performed prior to the initiation of the procedure. Local anesthesia was provided with 1% lidocaine. Under direct ultrasound guidance, the right internal jugular vein was accessed with a micro puncture sheath ultimately allowing placement of a 6 French vascular sheath. Slightly cranial to this initial access, the right internal jugular vein was again accessed with a micropuncture sheath ultimately allowing placement of an additional 6 French vascular sheath. With the use of a Bentson wire, a C2 catheter was  advanced into the main pulmonary artery and a limited pulmonary arteriogram was performed with a hand injection. Pressure measurements were then obtained from the level of the main pulmonary artery. With use of a Bentson wire, the C2 catheter was advanced into the left pulmonary artery. Limited contrast injection confirmed appropriate positioning. Over an exchange length Rosen wire, the C2 catheter was exchanged for a 105/18 cm multi side-hole EKOS ultrasound assisted infusion catheter. Next, a Bentson wire was utilized to advance a C2 catheter into the right pulmonary artery. Limited contrast injection confirmed appropriate positioning. Over an exchange length Rosen wire, the C2 catheter was exchanged for a 105/12 cm multi side-hole EKOS ultrasound assisted infusion catheter. A postprocedural fluoroscopic image was obtained of the check demonstrating final catheter positioning. Both vascular sheath were secured at the right neck with an interrupted 0 Prolene suture. The external catheter tubing was secured at the right chest and the lytic therapy was initiated. The patient tolerated the procedure well without immediate postprocedural complication. FINDINGS: Limited pulmonary arteriogram was performed for location documentation purposes. Acquired pressure measurements: Main pulmonary artery - 46/19 (mean - 39) (normal: < 25/10) Following the procedure, both ultrasound assisted infusion catheter tips terminate within the distal aspects of the  bilateral lower lobe sub segmental pulmonary arteries. IMPRESSION: 1. Successful fluoroscopic guided initiation of bilateral ultrasound assisted catheter directed pulmonary arterial lysis for sub massive pulmonary embolism and right-sided heart strain. 2. Markedly elevated pressure measurements within the main pulmonary artery compatible with critical pulmonary arterial hypertension. PLAN: The patient will return to the interventional radiology suite following 12 hours bilateral  pulmonary arterial catheter directed thrombolysis for repeat pressure measurements as well as potential placement of an IVC filter. Electronically Signed   By: Sandi Mariscal M.D.   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 As such, request made for bilateral pulmonary arterial ultrasound assisted catheter directed thrombolysis as well as potential placement of an IVC filter. EXAM: 1. ULTRASOUND GUIDANCE FOR VENOUS ACCESS X2 2. BILATERAL PULMONARY ARTERIOGRAPHY 3. FLUOROSCOPIC GUIDED PLACEMENT OF BILATERAL PULMONARY ARTERIAL LYTIC INFUSION CATHETERS COMPARISON:  Chest CTA - 12/02/2017 MEDICATIONS: None ANESTHESIA/SEDATION: Moderate (conscious) sedation was employed during this procedure. A total of Versed 4 mg, Dilaudid 0.5 mg and Fentanyl 150 mcg was administered intravenously. Moderate Sedation Time: 39 minutes. The patient's level of consciousness and vital signs were monitored continuously by radiology nursing throughout the procedure under my direct supervision. CONTRAST:  15 cc Isovue 300 FLUOROSCOPY TIME:  4 minutes 54 seconds (315 mGy) COMPLICATIONS: None immediate. TECHNIQUE: Informed written consent was obtained from the patient after a discussion of the risks, benefits and alternatives to treatment. Questions regarding the procedure were encouraged and answered. A timeout was performed prior to the initiation of the procedure. Given the presence of lower extremity DVT, the decision was made to perform the procedure via right internal jugular approach. As such, the right neck was prepped and draped in the usual sterile fashion, and a sterile drape was applied covering the operative field. Maximum barrier sterile technique with sterile gowns and gloves were used  for the procedure. A timeout was performed prior to the initiation of the procedure. Local anesthesia was provided with 1% lidocaine. Under direct ultrasound guidance, the right internal jugular vein was accessed with a micro puncture sheath ultimately allowing placement of a 6 French vascular sheath. Slightly cranial to this initial access, the right internal jugular vein was again accessed with a micropuncture sheath ultimately allowing placement of an additional 6 French vascular sheath. With the use of a Bentson wire, a C2 catheter was advanced into the main pulmonary artery and a limited pulmonary arteriogram was performed with a hand injection. Pressure measurements were then obtained from the level of the main pulmonary artery. With use of a Bentson wire, the C2 catheter was advanced into the left pulmonary artery. Limited contrast injection confirmed appropriate positioning. Over an exchange length Rosen wire, the C2 catheter was exchanged for a 105/18 cm multi side-hole EKOS ultrasound assisted infusion catheter. Next, a Bentson wire was utilized to advance a C2 catheter into the right pulmonary artery. Limited contrast injection confirmed appropriate positioning. Over an exchange length Rosen wire, the C2 catheter was exchanged for a 105/12 cm multi side-hole EKOS ultrasound assisted infusion catheter. A postprocedural fluoroscopic image was obtained of the check demonstrating final catheter positioning. Both vascular sheath were secured at the right neck with an interrupted 0 Prolene suture. The external catheter tubing was secured at the right chest and the lytic therapy was initiated. The patient tolerated the  procedure well without immediate postprocedural complication. FINDINGS: Limited pulmonary arteriogram was performed for location documentation purposes. Acquired pressure measurements: Main pulmonary artery - 46/19 (mean - 39) (normal: < 25/10) Following the procedure, both ultrasound assisted  infusion catheter tips terminate within the distal aspects of the bilateral lower lobe sub segmental pulmonary arteries. IMPRESSION: 1. Successful fluoroscopic guided initiation of bilateral ultrasound assisted catheter directed pulmonary arterial lysis for sub massive pulmonary embolism and right-sided heart strain. 2. Markedly elevated pressure measurements within the main pulmonary artery compatible with critical pulmonary arterial hypertension. PLAN: The patient will return to the interventional radiology suite following 12 hours bilateral pulmonary arterial catheter directed thrombolysis for repeat pressure measurements as well as potential placement of an IVC filter. Electronically Signed   By: Sandi Mariscal M.D.   On: 12/03/2017 16:10   Ir Jacolyn Reedy F/u Eval Art/ven Final Day (ms)  Result Date: 12/04/2017 INDICATION: History of right lower extremity DVT, post lower extremity catheter directed thrombolysis with postprocedural development of sub massive pulmonary embolism and CT/echo findings worrisome for right-sided heart strain. Post initiation of bilateral pulmonary arterial ultrasound assisted catheter directed thrombolysis on 12/03/2017 Request has also been made for placement of an IVC filter for temporary caval interruption purposes given presence of persistent DVT. EXAM: 1. IR PULMONARY ARTERIAL LYSIS, SUBSEQUENT; FLUOROSCOPIC GUIDED PULMONARY ARTERIAL PRESSURE MEASUREMENT ACQUISITION 2. IVC CATHETERIZATION AND VENOGRAM 3. IVC FILTER INSERTION COMPARISON:  Initiation of bilateral ultrasound assisted pulmonary arterial thrombolysis - 12/03/2017; chest CT - 12/02/2017; CT abdomen and pelvis - 12/02/2017 MEDICATIONS: None. ANESTHESIA/SEDATION: Dilaudid 2 mg IV; Versed 1 mg IV Sedation Time: 23 minutes; The patient was continuously monitored during the procedure by the interventional radiology nurse under my direct supervision. CONTRAST:  50 cc Isovue-300 FLUOROSCOPY TIME:  2 Minutes 30 seconds (416 mGy)  COMPLICATIONS: None immediate PROCEDURE: Patient was placed supine on the fluoroscopy table. Preprocedural spot fluoroscopic image was obtained of the chest and existing bilateral pulmonary arterial lytic infusion catheters. The introducer wire from the left pulmonary arterial infusion catheter was removed and the tip of the infusion catheter was retracted to the level of the main pulmonary artery where pressure measurements were obtained. At this time, both infusion catheters removed. The external portion of the adjacent right internal jugular vein vascular sheath as well as the surrounding skin were prepped and draped in usual sterile fashion. Maximal barrier sterile technique utilized including caps, mask, sterile gowns, sterile gloves, large sterile drape, hand hygiene, and Betadine prep. The more caudal vascular sheath was cannulated with a Kumpe the catheter which was utilized to advance a Bentson wire to the level of the IVC. Over the Bentson wire, a pigtail catheter was advanced into the caudal aspect of the abdominal aorta where a inferior vena cavagram was performed. Next, the vascular sheath was exchanged for the IVC filter delivery sheath and inner dilator. Through the delivery sheath, a retrievable Denali IVC filter was deployed below the level of the renal veins and above the IVC bifurcation. Limited post deployment venacavagram was performed. The delivery sheath as well as the adjacent vascular sheath were both removed and hemostasis was obtained with manual compression. A dressing was placed. The patient tolerated the procedure well without immediate post procedural complication. FINDINGS: Preprocedural spot fluoroscopic image demonstrates unchanged positioning of the bilateral pulmonary arterial lytic infusion catheters. Significant reduction in main pulmonary pressure with measurements as follows: Preprocedural pressure measurements - 46/19 (mean - 39) Postprocedural pressure measurements - 47/12  (mean - 29) The IVC is  patent. No evidence of thrombus, stenosis, or occlusion. No variant venous anatomy. Successful placement of the IVC filter below the level of the renal veins. IMPRESSION: 1. Technically successful completion of 12 hour bilateral ultrasound assisted pulmonary arterial thrombolysis with reduction in the main PA pressure by 10 mmHg. 2. Successful ultrasound and fluoroscopically guided placement of an infrarenal retrievable IVC filter via right jugular approach. PLAN: This IVC filter is potentially retrievable. The patient will be assessed for filter retrieval by Interventional Radiology in approximately 8-12 weeks. Further recommendations regarding filter retrieval, continued surveillance or declaration of device permanence, will be made at that time. Electronically Signed   By: Sandi Mariscal M.D.   On: 12/04/2017 10:53    Labs:  CBC: Recent Labs    12/04/17 0053 12/04/17 0529 12/04/17 1131 12/05/17 0354  WBC 6.0 5.4 7.3 6.7  HGB 7.7* 7.5* 8.1* 7.4*  HCT 23.8* 23.2* 25.0* 23.7*  PLT 281 269 285 299    COAGS: Recent Labs    11/27/17 1841 11/29/17 0241  INR 1.21 1.09    BMP: Recent Labs    11/30/17 0215 12/01/17 1025 12/02/17 0244 12/04/17 0529  NA 135 139 139 141  K 3.6 3.3* 3.2* 3.4*  CL 104 109 108 106  CO2 22 19* 21* 23  GLUCOSE 110* 109* 93 93  BUN <5* 6 5* 5*  CALCIUM 7.3* 7.5* 7.5* 7.8*  CREATININE 0.94 0.93 0.87 0.86  GFRNONAA >60 >60 >60 >60  GFRAA >60 >60 >60 >60    LIVER FUNCTION TESTS: Recent Labs    06/03/17 1435 09/30/17 1634 10/21/17 1551 11/28/17 0806  BILITOT 0.2 0.3 0.2 0.3  AST 14 13 12 19   ALT 8 9 8  12*  ALKPHOS 31* 39 44 36*  PROT 6.6 6.9 7.4 6.2*  ALBUMIN 3.5 3.6 3.7 2.9*    Assessment and Plan: Pulmonary embolus, RLE DVT s/p Ekos thrombolysis Patient s/p lysis with improvement in PA pressures.  Also underwent IVC filter placement.  Site assessed today; clean dry and intact.  IR available if  needed.  Electronically Signed: Docia Barrier, PA 12/05/2017, 11:54 AM   I spent a total of 15 Minutes at the the patient's bedside AND on the patient's hospital floor or unit, greater than 50% of which was counseling/coordinating care for DVT, PE

## 2017-12-05 NOTE — Progress Notes (Addendum)
[facility] [place]  Subjective  - Decreased effort to breath.  Over all she states she feels better.     Objective 121/89 (!) 104 98.2 F (36.8 C) (Oral) 14 93%  Intake/Output Summary (Last 24 hours) at 12/05/2017 0946 Last data filed at 12/05/2017 0800 Gross per 24 hour  Intake 1534 ml  Output -  Net 1534 ml    Bilateral LE edema without significant change, LE warm with intact active range of motion. Lungs 3L [place], non labored breathing at rest Abdomin soft Heart RRR    Assessment/Planning: s/p Procedure(s): PERIPHERAL VASCULAR THROMBECTOMY - Lysis Recheck (N/A) PERIPHERAL VASCULAR BALLOON ANGIOPLASTY (Right)  Radiology procedure: Post initiation of bilateral US assisted pulmonary arterial thrombolysis and IVC filter placement.  Breathing SAT 95% 3L [place].  Breathing efforts have improved.  States he left leg feels like it is not as tight and swollen.    Continue Heparin IV.   Vit B12 deficiency with anemia. HGB 7.4 she does have a history of Crohn's and rectal bleeding.  She states she has never required a transfusion in the past.  She reports no bloody stools since her admission.   Her BP is stable, HR 98 A & O x 3.    I will discuss her disposition with Dr. Trula Slade for now we observe her.      Roxy Horseman 12/05/2017 9:46 AM --  Laboratory Lab Results: Recent Labs    12/04/17 1131 12/05/17 0354  WBC 7.3 6.7  HGB 8.1* 7.4*  HCT 25.0* 23.7*  PLT 285 299   BMET Recent Labs    12/04/17 0529  NA 141  K 3.4*  CL 106  CO2 23  GLUCOSE 93  BUN 5*  CREATININE 0.86  CALCIUM 7.8*    COAG Lab Results  Component Value Date   INR 1.09 11/29/2017   INR 1.21 11/27/2017   INR 1.0 10/14/2016   No results found for: PTT   I agree with the above.  I am transferring her to the floor.  She has been converted to a NOAC.  If her Hb is stable tomorrow, she can go home with follow up in 2 weeks with bilateral LE venous duplex.  She  will need information about getting 20-30 thigh high compression.  Annamarie Major

## 2017-12-06 LAB — CBC
HEMATOCRIT: 23.6 % — AB (ref 36.0–46.0)
Hemoglobin: 7.4 g/dL — ABNORMAL LOW (ref 12.0–15.0)
MCH: 30 pg (ref 26.0–34.0)
MCHC: 31.4 g/dL (ref 30.0–36.0)
MCV: 95.5 fL (ref 78.0–100.0)
Platelets: 339 10*3/uL (ref 150–400)
RBC: 2.47 MIL/uL — ABNORMAL LOW (ref 3.87–5.11)
RDW: 15.8 % — AB (ref 11.5–15.5)
WBC: 6 10*3/uL (ref 4.0–10.5)

## 2017-12-06 LAB — GLUCOSE, CAPILLARY: Glucose-Capillary: 77 mg/dL (ref 65–99)

## 2017-12-06 MED ORDER — APIXABAN 5 MG PO TABS: 5.0000 mg | ORAL_TABLET | Freq: Two times a day (BID) | ORAL | 2 refills | Status: DC

## 2017-12-06 MED ORDER — DIAZEPAM 5 MG PO TABS: 5.0000 mg | ORAL_TABLET | Freq: Every evening | ORAL | 0 refills | Status: DC | PRN

## 2017-12-06 MED ORDER — APIXABAN 5 MG PO TABS: 5.0000 mg | ORAL_TABLET | Freq: Two times a day (BID) | ORAL | 0 refills | Status: DC

## 2017-12-06 NOTE — Progress Notes (Signed)
[id number] Notified by staff that pt interested in Pulmonary Rehab. Reviewed chart and obtained information for follow up by our pulmonary staff. Gave brochure to pt and briefly discussed program. Graylon Good RN BSN 12/06/2017 2:20 PM

## 2017-12-06 NOTE — Discharge Instructions (Addendum)
Information on my medicine - ELIQUIS (apixaban)  This medication education was reviewed with me or my healthcare representative as part of my discharge preparation.  The pharmacist that spoke with me during my hospital stay was:  Brain Hilts, [facility]  Why was Eliquis prescribed for you? Eliquis was prescribed to treat blood clots that may have been found in the veins of your legs (deep vein thrombosis) or in your lungs (pulmonary embolism) and to reduce the risk of them occurring again.  What do You need to know about Eliquis ? The starting dose is 10 mg (two 5 mg tablets) taken TWICE daily for the FIRST SEVEN (7) DAYS, then on Monday 12/12/17 the dose is reduced to ONE 5 mg tablet taken TWICE daily.  Eliquis may be taken with or without food.   Try to take the dose about the same time in the morning and in the evening. If you have difficulty swallowing the tablet whole please discuss with your pharmacist how to take the medication safely.  Take Eliquis exactly as prescribed and DO NOT stop taking Eliquis without talking to the doctor who prescribed the medication.  Stopping may increase your risk of developing a new blood clot.  Refill your prescription before you run out.  After discharge, you should have regular check-up appointments with your healthcare provider that is prescribing your Eliquis.    What do you do if you miss a dose? If a dose of ELIQUIS is not taken at the scheduled time, take it as soon as possible on the same day and twice-daily administration should be resumed. The dose should not be doubled to make up for a missed dose.  Important Safety Information A possible side effect of Eliquis is bleeding. You should call your healthcare provider right away if you experience any of the following: ? Bleeding from an injury or your nose that does not stop. ? Unusual colored urine (red or dark brown) or unusual colored stools (red or black). ? Unusual bruising for  unknown reasons. ? A serious fall or if you hit your head (even if there is no bleeding).  Some medicines may interact with Eliquis and might increase your risk of bleeding or clotting while on Eliquis. To help avoid this, consult your healthcare provider or pharmacist prior to using any new prescription or non-prescription medications, including herbals, vitamins, non-steroidal anti-inflammatory drugs (NSAIDs) and supplements.  This website has more information on Eliquis (apixaban): http://www.eliquis.com/eliquis/home

## 2017-12-06 NOTE — Progress Notes (Signed)
[facility]  Chart reviewed, it is significantly less expensive for the patient to take Xarelto rather than Eliquis. From my perspective it would be fine for her to take Xarelto.  Roselie Awkward, MD [facility] Pager: [phone] Cell: [phone] After 3pm or if no response, call [phone]

## 2017-12-06 NOTE — Care Management Note (Signed)
Case Management Note  Patient Details  Name: Lindsay Mccarthy MRN: [id number] Date of Birth: [birth year]  Subjective/Objective:  Pt is s/p thrombectomy and balloon angioplasty                  Action/Plan:  PTA independent from home with husband.  Pt to discharge home today on Eliquis 10 mg BID for 7 days and then 69m BID.  Benefit check submitted for 526mBID - CM requested updated benefit check for 106maily and per CMA pts copay will continue to be $84 and prior auth is still not required regardless if the daily dose is 47m51m 10mg[facility] - script will read specific instructions with increased quantity to cover full 30 day supply- [facility] confirmed that pt would be able to get a greater than 60 quantitiy filled with insurance coverage and that pt could use free 30day card for this fill .  CM provided both free 30 day card and copay card.     Expected Discharge Date:  12/06/17               Expected Discharge Plan:  Home/Self Care  In-House Referral:     Discharge planning Services  CM Consult  Post Acute Care Choice:    Choice offered to:     DME Arranged:    DME Agency:     HH Arranged:    HH Agency:     Status of Service:  Completed, signed off  If discussed at [facility] [place] [place] Meetings, dates discussed:    Additional Comments:  ClaxtMaryclare Labrador2/03/2018, 2:06 PM

## 2017-12-06 NOTE — Plan of Care (Signed)
  Health Behavior/Discharge Planning: Ability to manage health-related needs will improve 12/06/2017 0936 - Progressing by Wilma Flavin, RN    Coping: Level of anxiety will decrease 12/06/2017 0936 - Progressing by Wilma Flavin, RN  Pt states she feels "ready to be at home". Pt is calm & cooperative with no complaints of anxiety at this time.     Pain Managment: General experience of comfort will improve 12/06/2017 0936 - Progressing by Wilma Flavin, RN   Pt reports no pain at this time.

## 2017-12-06 NOTE — Progress Notes (Addendum)
        [facility] [place]  Subjective  - Doing much better.   Objective 129/83 97 97.9 F (36.6 C) (Oral) 17 92%  Intake/Output Summary (Last 24 hours) at 12/06/2017 1211 Last data filed at 12/06/2017 0000 Gross per 24 hour  Intake 60 ml  Output 4950 ml  Net -4890 ml    Edema still present B LE, active range of motion intract and sensation intact and equal B. Feet warm and well perfused. Heart RRR Lungs non labored breathing on RA SAT 96%  Assessment/Planning: s/pProcedure(s): PERIPHERAL VASCULAR THROMBECTOMY - Lysis Recheck (N/A) PERIPHERAL VASCULAR BALLOON ANGIOPLASTY (Right)  Radiology procedure: Postinitiation of bilateral US assisted pulmonary arterial thrombolysis and IVC filter placement.  Anemia asymptomatic  She has ambulated without desaturation of her O2.  She reports no dizziness or weakness. She has been started on Eliquis and will be discharged on it. F/U with Dr. Trula Slade in 2 weeks.  She was given information on compression garments 20-30  Thigh highs and a work note.     Roxy Horseman 12/06/2017 12:11 PM --  Laboratory Lab Results: Recent Labs    12/05/17 0354 12/06/17 0314  WBC 6.7 6.0  HGB 7.4* 7.4*  HCT 23.7* 23.6*  PLT 299 339   BMET Recent Labs    12/04/17 0529  NA 141  K 3.4*  CL 106  CO2 23  GLUCOSE 93  BUN 5*  CREATININE 0.86  CALCIUM 7.8*    COAG Lab Results  Component Value Date   INR 1.09 11/29/2017   INR 1.21 11/27/2017   INR 1.0 10/14/2016   No results found for: PTT

## 2017-12-07 ENCOUNTER — Telehealth: Payer: Self-pay | Admitting: Surgery

## 2017-12-07 ENCOUNTER — Encounter: Payer: Self-pay | Admitting: Internal Medicine

## 2017-12-07 MED ORDER — AZATHIOPRINE 50 MG PO TABS: ORAL_TABLET | ORAL | 2 refills | Status: DC

## 2017-12-07 NOTE — Telephone Encounter (Signed)
Sched lab 12/16/17 at 4:00 and MD 12/19/17 at 4:00. Lm on cell# to inform pt of appts.

## 2017-12-07 NOTE — Telephone Encounter (Signed)
-----   Message from Mena Goes, RN sent at 12/07/2017 10:34 AM EST ----- Regarding: 2 weeks postop w/ duplex   ----- Message ----- From: Ulyses Amor, PA-C Sent: 12/07/2017  10:31 AM To: [facility] Charge Pool  follow up in 2 weeks with bilateral LE venous duplex with Dr. Trula Slade s/p right LE thrombolysis and IVC filter placement.

## 2017-12-07 NOTE — Discharge Summary (Signed)
[facility] Discharge Summary   Patient ID:  Lindsay Mccarthy MRN: [id number] DOB/AGE: 1976-08-06 42 y.o.  Admit date: 11/27/2017 Discharge date: 12/06/2017 Date of Surgery: 11/30/2017 Surgeon: Surgeon(s): Serafina Mitchell, MD  Admission Diagnosis: rt leg swelling, hx blood clot  Discharge Diagnoses:  rt leg swelling, hx blood clot  Secondary Diagnoses: Past Medical History:  Diagnosis Date  . Anemia    related to Crohns flares  . Anxiety   . Arthritis    knees, feet, hands, wrists, related to Crohns flare  . Asthma    Childhood  . B12 deficiency    monitored/treated by Dr. Carlean Purl  . Cervical dysplasia   . Crohn's disease of small intestine ([place]) 06/24/2012   Diagnosed 1999, in [place]. Ileitis only then. Treated with Imuran Remicade and prednisone.  Noncompliant with therapy. 2004 return to care and was treated with Remicade prednisone Pentasa Cipro and Flagyl. 2006 status post right hemicolectomy. Subsequently treated with azathioprine and Cimzia. 200 mg every other week.  azathioprine was added in 2010. Prometheus TP MT enzyme was negative. Has ha  . Depression   . Eczema    arms and behind knees, worse in winter  . History of recurrent UTI (urinary tract infection)   . HPV (human papilloma virus) infection   . Hyperlipemia   . Osteopenia of femur neck T. score -1.4 09/29/2011   09/2013 T-1.6  . Papanicolaou smear 12/12   last abnormal 2011  . Scaphoid fracture of wrist 2013   left  . Seasonal allergic rhinitis   . Shingles 09/2015   R hip  . Wears glasses     Procedure(s): PERIPHERAL VASCULAR THROMBECTOMY - Lysis Recheck PERIPHERAL VASCULAR BALLOON ANGIOPLASTY  Discharged Condition: stable  HPI:  42 y/o female developed sudden right leg swelling with SOB.  She has a prior history of superficial venous thrombosis.  She denise recent long traveling trips and no family history of hypercoagulable states.    Past medical history includes:   Crohn's disease and is actively having a flare now.  She denies personal or family history of hypercoagulable state.  No recent GI or neuro bleed.  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 She does not take oral contraceptives.  She has an IUD.   Further work up includes: CTA chest chronic and acute PE Duplex DVT femoral to calf iliac not visualized due to body habitus. Heparin was initiated.    Hospital Course:  Lindsay Mccarthy is a 42 y.o. female is S/P Right 11/29/2017 Procedure(s): PERIPHERAL VASCULAR THROMBECTOMY - Lysis 11/30/2017 Recheck PERIPHERAL VASCULAR BALLOON ANGIOPLASTY   Symptoms of RLE improved and Heparin was continued.   Maintained palpable pulses in all 4 extremities.  Interventional radiology consulted for bilateral US assisted pulmonary arterial thrombolysis and  placement of an infrarenal IVC filter via the R IJ. Her SOB subsided prior to discharge she was weaned off O2.  SAT on RA prior to discharge was 96%.  She was ambulatory and discharged on Eliquis BID.  F/U with Dr. Trula Slade in 2 weeks.  She was given information on compression garments 20-30  Thigh highs    Significant Diagnostic Studies: CBC Lab Results  Component Value Date   WBC 6.0 12/06/2017   HGB 7.4 (L) 12/06/2017   HCT 23.6 (L) 12/06/2017   MCV 95.5 12/06/2017   PLT 339 12/06/2017    BMET    Component Value Date/Time   NA 141 12/04/2017 0529  K 3.4 (L) 12/04/2017 0529   CL 106 12/04/2017 0529   CO2 23 12/04/2017 0529   GLUCOSE 93 12/04/2017 0529   BUN 5 (L) 12/04/2017 0529   CREATININE 0.86 12/04/2017 0529   CREATININE 0.81 12/15/2015 0001   CALCIUM 7.8 (L) 12/04/2017 0529   GFRNONAA >60 12/04/2017 0529   GFRAA >60 12/04/2017 0529   COAG Lab Results  Component Value Date   INR 1.09 11/29/2017   INR 1.21 11/27/2017   INR 1.0 10/14/2016     Disposition:  Discharge to :Home Discharge Instructions    Call MD for:  redness, tenderness, or signs  of infection (pain, swelling, bleeding, redness, odor or green/yellow discharge around incision site)   Complete by:  As directed    Call MD for:  redness, tenderness, or signs of infection (pain, swelling, bleeding, redness, odor or green/yellow discharge around incision site)   Complete by:  As directed    Call MD for:  severe or increased pain, loss or decreased feeling  in affected limb(s)   Complete by:  As directed    Call MD for:  severe or increased pain, loss or decreased feeling  in affected limb(s)   Complete by:  As directed    Call MD for:  temperature >100.5   Complete by:  As directed    Call MD for:  temperature >100.5   Complete by:  As directed    Discharge instructions   Complete by:  As directed    Shower daily as needed   Driving Restrictions   Complete by:  As directed    No driving for 1 week   Resume previous diet   Complete by:  As directed    Resume previous diet   Complete by:  As directed      Allergies as of 12/06/2017      Reactions   Ibuprofen Other (See Comments)   Avoids due to Crohns disease   Morphine And Related Itching   itchy   Prednisone Other (See Comments)   "crazy", hallucinations   Adhesive [tape] Rash   Rash with electrodes      Medication List    STOP taking these medications   celecoxib 200 MG capsule Commonly known as:  CELEBREX     TAKE these medications   acetaminophen 500 MG tablet Commonly known as:  TYLENOL Take 1,000 mg by mouth every 6 (six) hours as needed for headache (pain). Reported on 12/15/2015   apixaban 5 MG Tabs tablet Commonly known as:  ELIQUIS Take 1 tablet (5 mg total) by mouth 2 (two) times daily. Take 2 tablets BID for 5 days then take 1 tablet BID Start taking on:  12/12/2017   aspirin 325 MG tablet Take 325 mg by mouth daily.   azaTHIOprine 50 MG tablet Commonly known as:  IMURAN Take 4 tablets by mouth every day What changed:    how much to take  how to take this  when to take  this  additional instructions  Another medication with the same name was removed. Continue taking this medication, and follow the directions you see here.   BIOFREEZE EX Apply 1 application topically 3 (three) times daily as needed (pain).   buPROPion 100 MG 12 hr tablet Commonly known as:  WELLBUTRIN SR TAKE 1 TABLET BY MOUTH TWICE A DAY What changed:    how much to take  how to take this  when to take this   cetirizine 10 MG tablet Commonly  known as:  ZYRTEC Take 10 mg by mouth daily.   cyanocobalamin 1000 MCG/ML injection Commonly known as:  (VITAMIN B-12) INJECT AS DIRECTED EVERY 30 DAYS What changed:  See the new instructions.   diazepam 5 MG tablet Commonly known as:  VALIUM Take 1 tablet (5 mg total) by mouth at bedtime as needed for muscle spasms.   diphenhydrAMINE 25 MG tablet Commonly known as:  SOMINEX Take 25 mg by mouth at bedtime.   ENTYVIO IV Inject into the vein See admin instructions. Administer once every 8 weeks, ordered by Dr. Carlean Purl (last injection 10/31/17)   escitalopram 10 MG tablet Commonly known as:  LEXAPRO TAKE 1 TABLET (10 MG TOTAL) BY MOUTH DAILY.   fenofibrate 160 MG tablet Take 1 tablet (160 mg total) by mouth daily. What changed:  when to take this   ferrous sulfate 325 (65 FE) MG tablet Take 1-2 tablets daily with food - at least 1x/day What changed:    how much to take  how to take this  when to take this  additional instructions   Fish Oil 1200 MG Caps Take 1,200 mg by mouth 2 (two) times daily.   levonorgestrel 20 MCG/24HR IUD Commonly known as:  MIRENA 1 each by Intrauterine route once. Implanted April 2017   traMADol 50 MG tablet Commonly known as:  ULTRAM Take 50 mg by mouth daily as needed (pain).      Verbal and written Discharge instructions given to the patient. Wound care per Discharge AVS Follow-up Information    Serafina Mitchell, MD Follow up in 2 week(s).   Specialties:  Vascular Surgery,  Cardiology Why:  office will make appt. Contact information: [address] [place] [address] [phone]           Signed: Roxy Horseman 12/07/2017, 10:32 AM

## 2017-12-08 ENCOUNTER — Encounter: Payer: Self-pay | Admitting: Internal Medicine

## 2017-12-09 ENCOUNTER — Other Ambulatory Visit (HOSPITAL_COMMUNITY): Payer: Self-pay

## 2017-12-09 DIAGNOSIS — I2609 Other pulmonary embolism with acute cor pulmonale: Secondary | ICD-10-CM

## 2017-12-12 ENCOUNTER — Encounter: Payer: Self-pay | Admitting: Family Medicine

## 2017-12-12 DIAGNOSIS — Z5181 Encounter for therapeutic drug level monitoring: Secondary | ICD-10-CM

## 2017-12-12 DIAGNOSIS — E781 Pure hyperglyceridemia: Secondary | ICD-10-CM

## 2017-12-12 DIAGNOSIS — E559 Vitamin D deficiency, unspecified: Secondary | ICD-10-CM

## 2017-12-12 DIAGNOSIS — Z0000 Encounter for general adult medical examination without abnormal findings: Secondary | ICD-10-CM

## 2017-12-13 ENCOUNTER — Other Ambulatory Visit: Payer: 59

## 2017-12-13 ENCOUNTER — Telehealth (HOSPITAL_COMMUNITY): Payer: Self-pay

## 2017-12-13 DIAGNOSIS — E559 Vitamin D deficiency, unspecified: Secondary | ICD-10-CM

## 2017-12-13 DIAGNOSIS — Z5181 Encounter for therapeutic drug level monitoring: Secondary | ICD-10-CM

## 2017-12-13 DIAGNOSIS — Z0000 Encounter for general adult medical examination without abnormal findings: Secondary | ICD-10-CM

## 2017-12-13 DIAGNOSIS — E781 Pure hyperglyceridemia: Secondary | ICD-10-CM

## 2017-12-13 NOTE — Progress Notes (Signed)
Chief Complaint  Patient presents with  . Annual Exam    nonfasting annual, no pap-sees Dr. Quincy Simmonds and is UTD. Sees [facility] for eye exam and is UTD. No concerns today. Will try again to give UA but not having any concerns.     Lindsay Mccarthy is a 42 y.o. female who presents for a complete physical.  She has the following concerns:  She was recently hospitalized with extensive DVT and PE. She is currently on Eliquis. She was admitted 1/27-12/06/17.  Interventional radiology consulted for bilateral US assisted pulmonary arterial thrombolysis and placement of an infrarenal IVC filter via theR IJ. She had peripheral vascular thrombectomy and peripheral vascular balloon angioplasty.  She is scheduled to f/u with Dr. Trula Slade on 12/19/17. She was given information on compression garments 20-30 thigh highs--she got a pair that goes just above her knee and has been wearing at home (thinks they are knee-high, but due to her height, can wear them above the knee).  She is compliant with Eliquis. Denies bleeding, bruising.  Denies shortness of breath, chest pain. She continues to have some swelling in the right leg, but overall much better.  She has discomfort/pain at the right upper medial thigh.  Hypertriglyceridemia:  She is following a lowfat diet. She is compliant with medications and denies side effects. Lab Results  Component Value Date   CHOL 136 12/14/2016   HDL 63 12/14/2016   LDLCALC 50 12/14/2016   TRIG 115 12/14/2016   CHOLHDL 2.2 12/14/2016    She previously had abnormal paps, HPV. Treated with LEEP in 2011. She is under the care of Dr. Quincy Simmonds for her GYN care.  She was last seen 12/2016, last pap 12/2015. She has appointment scheduled next month. She had her Mirena IUD replaced in April 2017. She has never been pregnant, and doesn't want children.   Obesity:  20# weight gain over the last year.  Hasn't been doing Weight Watchers.  No sweets since home from hospital. No soda, sweet  tea.  +snacking is contributing to her weight gain. She had active Crohn's, which limited what she could eat--couldn't eat raw vegetables, most fruits (just bananas).  Depression follow-up: Lexapro dose was increased to 32m around April of last year--when Crohn's started flaring, and mom's diagnosis of metastatic breast cancer.  "I'm doing okay".  She also continues on Wellbutrin SR 1056mBID.  Moods are well controlled.   Osteopenia:  DEXA 09/2013 showed T -1.6. She had repeat study 10/2016: Results:  Lumbar spine (L1-L4) Femoral neck (FN) 33% distal radius  T-score 1.4 RFN:-1.3 LFN:-1.1 n/a  Change in BMD from previous DXA test (%) Up 7.8% Down 0.1% n/a  (*) statistically significant  She takes Viactiv BID. She continues to get some milk in her diet, no cheese.  Immunization History  Administered Date(s) Administered  . Hepatitis A 06/29/2012, 01/04/2013  . Influenza Split 08/09/2012  . Influenza,inj,Quad PF,6+ Mos 09/18/2013, 10/07/2014, 12/15/2015, 08/02/2016, 10/05/2017  . PPD Test 04/30/2013, 10/07/2014  . Pneumococcal Polysaccharide-23 07/06/2012  . Tdap 12/15/2015   Last Pap smear: 12/2015, normal with no high risk HPV Last mammogram: 12/2015 Last colonoscopy: 06/2017, Dr. GeCarlean Purlast DEXA: 10/2016 Ophtho: yearly Dentist: twice yearly  Exercise: nothing regular.  Had been working [place] [employer] [place] weeks prior to getting blood clot.  Plans to resume when able.  Past Medical History:  Diagnosis Date  . Anemia    related to Crohns flares  . Anxiety   .  Arthritis    knees, feet, hands, wrists, related to Crohns flare  . Asthma    Childhood  . B12 deficiency    monitored/treated by Dr. Carlean Purl  . Cervical dysplasia   . Crohn's disease of small intestine ([place]) 06/24/2012   Diagnosed 1999, in [place]. Ileitis only then. Treated with Imuran Remicade and prednisone.  Noncompliant with therapy. 2004 return to care and was treated with Remicade  prednisone Pentasa Cipro and Flagyl. 2006 status post right hemicolectomy. Subsequently treated with azathioprine and Cimzia. 200 mg every other week.  azathioprine was added in 2010. Prometheus TP MT enzyme was negative. Has ha  . Depression   . Eczema    arms and behind knees, worse in winter  . History of recurrent UTI (urinary tract infection)   . HPV (human papilloma virus) infection   . Hyperlipemia   . Osteopenia of femur neck T. score -1.4 09/29/2011   09/2013 T-1.6  . Papanicolaou smear 12/12   last abnormal 2011  . Scaphoid fracture of wrist 2013   left  . Seasonal allergic rhinitis   . Shingles 09/2015   R hip  . Wears glasses     Past Surgical History:  Procedure Laterality Date  . APPENDECTOMY    . CERVICAL BIOPSY  W/ LOOP ELECTRODE EXCISION  2009   ---paps normal since  . COLONOSCOPY  multiple   scanned  . ESOPHAGOGASTRODUODENOSCOPY  multiple   scanned  . HEMICOLECTOMY  2006  . IR ANGIOGRAM PULMONARY BILATERAL SELECTIVE  12/03/2017  . IR ANGIOGRAM SELECTIVE EACH ADDITIONAL VESSEL  12/03/2017  . IR ANGIOGRAM SELECTIVE EACH ADDITIONAL VESSEL  12/03/2017  . IR INFUSION THROMBOL ARTERIAL INITIAL (MS)  12/03/2017  . IR INFUSION THROMBOL ARTERIAL INITIAL (MS)  12/03/2017  . IR IVC FILTER PLMT / S&I /IMG GUID/MOD SED  12/04/2017  . IR THROMB F/U EVAL ART/VEN FINAL DAY (MS)  12/04/2017  . IR US GUIDE VASC ACCESS RIGHT  12/03/2017  . LEEP  2011   --done in [place]  . PERIPHERAL VASCULAR BALLOON ANGIOPLASTY Right 11/30/2017   Procedure: PERIPHERAL VASCULAR BALLOON ANGIOPLASTY;  Surgeon: Serafina Mitchell, MD;  Location: [facility];  Service: Cardiovascular;  Laterality: Right;  . PERIPHERAL VASCULAR THROMBECTOMY N/A 11/29/2017   Procedure: PERIPHERAL VASCULAR THROMBECTOMY - THROMBOLYSIS;  Surgeon: Serafina Mitchell, MD;  Location: [facility];  Service: Cardiovascular;  Laterality: N/A;  LYSIS CATHETER PLACEMENT ONLY  . PERIPHERAL VASCULAR THROMBECTOMY N/A 11/30/2017    Procedure: PERIPHERAL VASCULAR THROMBECTOMY - Lysis Recheck;  Surgeon: Serafina Mitchell, MD;  Location: [facility];  Service: Cardiovascular;  Laterality: N/A;  . WISDOM TOOTH EXTRACTION      Social History   Socioeconomic History  . Marital status: Married    Spouse name: Not on file  . Number of children: 0  . Years of education: Not on file  . Highest education level: Not on file  Social Needs  . Financial resource strain: Not on file  . Food insecurity - worry: Not on file  . Food insecurity - inability: Not on file  . Transportation needs - medical: Not on file  . Transportation needs - non-medical: Not on file  Occupational History  . Occupation: Teacher, early years/pre  Tobacco Use  . Smoking status: Former Smoker    Packs/day: 1.00    Years: 4.00    Pack years: 4.00    Last attempt to quit: 11/18/1998    Years since quitting: 19.0  .  Smokeless tobacco: Never Used  Substance and Sexual Activity  . Alcohol use: No    Alcohol/week: 0.0 oz  . Drug use: No  . Sexual activity: Yes    Partners: Male    Birth control/protection: IUD    Comment: Mirena inserted 01/2016  Other Topics Concern  . Not on file  Social History Narrative   The patient is divorced.     Re-married Marcello Moores, partner of 10 years, on 10/10/16. 1 cats, 2 dogs, 1 cat   No children - doesn't want any   [place].   Moved from [place] to [place] in 2013.   Past smoker   No alcohol   1 caffeinated beverages a day   She reports she is compliant with sunscreen given her increased risk of sun damage and skin cancer on azathioprine      Updated 12/15/17    Family History  Problem Relation Age of Onset  . Hypertension Mother   . Breast cancer Mother 12  . Bone cancer Mother        metastatic from breast  . Depression Mother   . Hypothyroidism Mother   . Colon polyps Father   . Diabetes Father        borderline  . Hypertension Father   . Hyperlipidemia Father   .  Colon cancer Paternal Grandfather   . Multiple sclerosis Sister   . Alcohol abuse Sister   . Fuch's dystrophy Sister   . Stroke Maternal Grandmother   . Lung cancer Maternal Grandmother   . Hypertension Maternal Grandmother   . Heart disease Paternal Grandmother   . Hypertension Paternal Grandmother     Outpatient Encounter Medications as of 12/15/2017  Medication Sig Note  . apixaban (ELIQUIS) 5 MG TABS tablet Take 1 tablet (5 mg total) by mouth 2 (two) times daily. Take 2 tablets BID for 5 days then take 1 tablet BID   . aspirin 325 MG tablet Take 325 mg by mouth daily.   Marland Kitchen azaTHIOprine (IMURAN) 50 MG tablet Take 4 tablets by mouth every day   . buPROPion (WELLBUTRIN SR) 100 MG 12 hr tablet TAKE 1 TABLET BY MOUTH TWICE A DAY (Patient taking differently: TAKE 1 TABLET (100 MG) BY MOUTH TWICE A DAY)   . Calcium-Vitamin D-Vitamin K 657-846-96 MG-UNT-MCG CHEW Chew 2 each by mouth daily.   . cetirizine (ZYRTEC) 10 MG tablet Take 10 mg by mouth daily.   . cyanocobalamin (,VITAMIN B-12,) 1000 MCG/ML injection INJECT AS DIRECTED EVERY 30 DAYS (Patient taking differently: INJECT 1 ML (1000 MCG) INTRAMUSCULARLY EVERY 30 DAYS) 11/27/2017: Last injection 11/01/17  . diphenhydrAMINE (SOMINEX) 25 MG tablet Take 25 mg by mouth at bedtime.    Marland Kitchen escitalopram (LEXAPRO) 10 MG tablet TAKE 1 TABLET (10 MG TOTAL) BY MOUTH DAILY.   . fenofibrate 160 MG tablet Take 1 tablet (160 mg total) by mouth daily. (Patient taking differently: Take 160 mg by mouth at bedtime. )   . ferrous sulfate 325 (65 FE) MG tablet Take 1-2 tablets daily with food - at least 1x/day (Patient taking differently: Take 325 mg by mouth 2 (two) times daily with a meal. )   . levonorgestrel (MIRENA) 20 MCG/24HR IUD 1 each by Intrauterine route once. Implanted April 2017   . Menthol, Topical Analgesic, (BIOFREEZE EX) Apply 1 application topically 3 (three) times daily as needed (pain).   . Omega-3 Fatty Acids (FISH OIL) 1200 MG CAPS Take 1,200 mg  by mouth 2 (two)  times daily.   . Vedolizumab (ENTYVIO IV) Inject into the vein See admin instructions. Administer once every 8 weeks, ordered by Dr. Carlean Purl (last injection 10/31/17) 11/27/2017: Unknown strength   . acetaminophen (TYLENOL) 500 MG tablet Take 1,000 mg by mouth every 6 (six) hours as needed for headache (pain). Reported on 12/15/2015   . diazepam (VALIUM) 5 MG tablet Take 1 tablet (5 mg total) by mouth at bedtime as needed for muscle spasms. (Patient not taking: Reported on 12/15/2017)   . [DISCONTINUED] traMADol (ULTRAM) 50 MG tablet Take 50 mg by mouth daily as needed (pain). 11/27/2017: #[facility]/17 [facility] per PMP AWARE   No facility-administered encounter medications on file as of 12/15/2017.     Allergies  Allergen Reactions  . Ibuprofen Other (See Comments)    Avoids due to Crohns disease  . Morphine And Related Itching    itchy  . Prednisone Other (See Comments)    "crazy", hallucinations  . Adhesive [Tape] Rash    Rash with electrodes    ROS: The patient denies anorexia, fever, headaches, vision changes, decreased hearing, ear pain, sore throat, breast concerns, chest pain, palpitations, dizziness, syncope, cough, nausea, vomiting, indigestion/heartburn, hematuria, incontinence, dysuria, vaginal bleeding, discharge, odor or itch, genital lesions, numbness, tingling, tremor, suspicious skin lesions, anxiety, abnormal bleeding/bruising or enlarged lymph nodes. Has Mirena, no menstrual bleeding. Crohn's was very well controlled while in the hospital; current medication has been helping; some diarrhea, more controlled, no bleeding. Chronic allergies, controlled by OTC meds. Switches between Allegra and Zyrtec Has some persistent mild shortness of breath from PE, much improved. Notices only with exertion, not at rest. Has persistent right knee soreness/pain, and right hip/groin; rest of leg pain has resolved (from DVT). Slight swelling at her knee in the evenings.  No  giving way.  Chronic fatigue, no noticeable change recently 20# weight gain since last physical   PHYSICAL EXAM:  BP 120/76   Pulse 80   Ht 5' 4.25" (1.632 m)   Wt 218 lb 6.4 oz (99.1 kg)   BMI 37.20 kg/m   Wt Readings from Last 3 Encounters:  12/15/17 218 lb 6.4 oz (99.1 kg)  11/27/17 220 lb (99.8 kg)  10/17/17 219 lb 6.4 oz (99.5 kg)    197# 9.6 oz at her CPE last year   General Appearance:   Alert, cooperative, no distress, appears stated age  Head:   Normocephalic, without obvious abnormality, atraumatic  Eyes:   PERRL, conjunctiva/corneas clear, EOM's intact, fundi benign  Ears:   Normal TM's and external ear canals  Nose:  Nares normal, mucosa mildly edematous, no drainage or sinus tenderness. Scab noted at right septum,no active bleeding  Throat:  Lips, mucosa, and tongue normal; teeth and gums normal  Neck:  Supple, no lymphadenopathy; thyroid: no enlargement/tenderness/ nodules; no carotid bruit or JVD  Back:  Spine nontender, no curvature, ROM normal, no CVA tenderness.  Lungs:   Clear to auscultation bilaterally without wheezes, rales or ronchi; respirations unlabored  Chest Wall:   No tenderness or deformity  Heart:   Regular rate and rhythm, S1 and S2 normal, no murmur, rub or gallop  Breast Exam:   Deferred to GYN  Abdomen:   Soft, nondistended, normoactive bowel sounds, no masses, no hepatosplenomegaly. Minimally tender at RLQ, no rebound, guarding or masses  Genitalia:  deferred to GYN  Rectal:   deferred  Extremities:  No clubbing or cyanosis. There is asymmetric swelling of the entire right leg compared to the  left (significantly improved, per pt), with some pinkish-violaceous slight discoloration noted in the right thigh.  There is some erythema, warmth, and slight cord vs induration and tender at her proximal medial thigh.  (same/improving, not worse, per pt)  Pulses:  2+ and symmetric all extremities   Skin:  Skin color, texture, turgor normal, no rashes or lesions  Lymph nodes:  Cervical, supraclavicular, and axillary nodes normal  Neurologic:  CNII-XII intact, normal strength, sensation and gait; reflexes 2+ and symmetric throughout.    Psych: Normal mood, affect, hygiene and grooming  PHQ-9 score of 5 (mainly related to fatigue)    Chemistry      Component Value Date/Time   NA 142 12/13/2017 0942   K 4.4 12/13/2017 0942   CL 105 12/13/2017 0942   CO2 21 12/13/2017 0942   BUN 7 12/13/2017 0942   CREATININE 0.87 12/13/2017 0942   CREATININE 0.81 12/15/2015 0001      Component Value Date/Time   CALCIUM 9.4 12/13/2017 0942   ALKPHOS 54 12/13/2017 0942   AST 21 12/13/2017 0942   ALT 15 12/13/2017 0942   BILITOT 0.3 12/13/2017 0942     fasting glucose 87  Lab Results  Component Value Date   CHOL 119 12/13/2017   HDL 45 12/13/2017   LDLCALC 44 12/13/2017   TRIG 150 (H) 12/13/2017   CHOLHDL 2.6 12/13/2017   Lab Results  Component Value Date   TSH 0.844 12/13/2017   Lab Results  Component Value Date   WBC 8.1 12/13/2017   HGB 10.8 (L) 12/13/2017   HCT 35.4 12/13/2017   MCV 94 12/13/2017   PLT 837 (HH) 12/13/2017     ASSESSMENT/PLAN:  Annual physical exam  Hypertriglyceridemia - continue fenofibrate. Reviewed lowfat diet - Plan: fenofibrate 160 MG tablet  Crohn's disease of small intestine with other complication (HCC) - stable, per Dr. Carlean Purl.  Bowels improved on current regimen  Immunosuppressed status ([place]) - due to medications. Consider pneumovax booster (last was 5 years ago)  Depression, major, in remission ([place]) - continue wellbutrin and 44m lexapro  Other acute pulmonary embolism with acute cor pulmonale (HCC) - dyspnea is improved, still some shortness of breath with exertion, but improving. Continue eliquis - Plan: Ambulatory referral to Hematology  Acute deep vein thrombosis (DVT) of femoral vein of right  lower extremity ([place] - cont eliquis and f/u as scheduled with vascular. Referring to hematology to help eval underlying cause, elevated plt, and confirm length of need for anticoag - Plan: Ambulatory referral to Hematology  Elevated platelet count - currently on ASA and eliquis.  hematology to address - Plan: Ambulatory referral to Hematology   Discussed monthly self breast exams and yearly mammograms after the age of 89(she had baseline mammo at age 42 due after next birthday, 3D recommended); at least 30 minutes of aerobic activity at least 5 days/week including weight-bearing exercise at least twice per week (when able; currently recovering from PE); proper sunscreen use reviewed; healthy diet, including goals of calcium and vitamin D intake and alcohol recommendations (less than or equal to 1 drink/day) reviewed; regular seatbelt use; changing batteries in smoke detectors. Immunization recommendations discussed--UTD; continue yearly flu shots. Consider pneumovax booster due to immunosuppressant drugs, last >5 years ago. Colonoscopy recommendations reviewed--per Dr. GCarlean Purl  F/u 1 year, sooner prn F/u with other providers as planned (GI, vascular, GYN, and referral to heme)

## 2017-12-13 NOTE — Telephone Encounter (Signed)
Patients insurance is active and benefits verified through [employer] - No co-pay, deductible amount of $1,100/$1,100 has been met, out of pocket amount of $3,300/$1,859.73 has been met, 20% co-insurance, and no pre-authorization is required. Spoke with [employer] - Reference #[id number] ° °Patient will be contacted for scheduling. °

## 2017-12-14 LAB — COMPREHENSIVE METABOLIC PANEL
ALBUMIN: 3.9 g/dL (ref 3.5–5.5)
ALT: 15 IU/L (ref 0–32)
AST: 21 IU/L (ref 0–40)
Albumin/Globulin Ratio: 1.2 (ref 1.2–2.2)
Alkaline Phosphatase: 54 IU/L (ref 39–117)
BILIRUBIN TOTAL: 0.3 mg/dL (ref 0.0–1.2)
BUN / CREAT RATIO: 8 — AB (ref 9–23)
BUN: 7 mg/dL (ref 6–20)
CHLORIDE: 105 mmol/L (ref 96–106)
CO2: 21 mmol/L (ref 20–29)
CREATININE: 0.87 mg/dL (ref 0.57–1.00)
Calcium: 9.4 mg/dL (ref 8.7–10.2)
GFR calc non Af Amer: 84 mL/min/{1.73_m2} (ref >59)
GFR, EST AFRICAN AMERICAN: 97 mL/min/{1.73_m2} (ref >59)
GLUCOSE: 87 mg/dL (ref 65–99)
Globulin, Total: 3.3 g/dL (ref 1.5–4.5)
Potassium: 4.4 mmol/L (ref 3.5–5.2)
Sodium: 142 mmol/L (ref 134–144)
TOTAL PROTEIN: 7.2 g/dL (ref 6.0–8.5)

## 2017-12-14 LAB — CBC WITH DIFFERENTIAL/PLATELET
BASOS ABS: 0 10*3/uL (ref 0.0–0.2)
Basos: 0 %
EOS (ABSOLUTE): 0.2 10*3/uL (ref 0.0–0.4)
Eos: 3 %
Hematocrit: 35.4 % (ref 34.0–46.6)
Hemoglobin: 10.8 g/dL — ABNORMAL LOW (ref 11.1–15.9)
Immature Grans (Abs): 0 10*3/uL (ref 0.0–0.1)
Immature Granulocytes: 1 %
LYMPHS ABS: 1.3 10*3/uL (ref 0.7–3.1)
Lymphs: 16 %
MCH: 28.8 pg (ref 26.6–33.0)
MCHC: 30.5 g/dL — AB (ref 31.5–35.7)
MCV: 94 fL (ref 79–97)
MONOCYTES: 6 %
MONOS ABS: 0.4 10*3/uL (ref 0.1–0.9)
NEUTROS ABS: 6.1 10*3/uL (ref 1.4–7.0)
Neutrophils: 74 %
PLATELETS: 837 10*3/uL — AB (ref 150–379)
RBC: 3.75 x10E6/uL — AB (ref 3.77–5.28)
RDW: 16.4 % — AB (ref 12.3–15.4)
WBC: 8.1 10*3/uL (ref 3.4–10.8)

## 2017-12-14 LAB — LIPID PANEL
CHOLESTEROL TOTAL: 119 mg/dL (ref 100–199)
Chol/HDL Ratio: 2.6 ratio (ref 0.0–4.4)
HDL: 45 mg/dL (ref >39)
LDL Calculated: 44 mg/dL (ref 0–99)
Triglycerides: 150 mg/dL — ABNORMAL HIGH (ref 0–149)
VLDL CHOLESTEROL CAL: 30 mg/dL (ref 5–40)

## 2017-12-14 LAB — VITAMIN D 25 HYDROXY (VIT D DEFICIENCY, FRACTURES): VIT D 25 HYDROXY: 32 ng/mL (ref 30.0–100.0)

## 2017-12-14 LAB — TSH: TSH: 0.844 u[IU]/mL (ref 0.450–4.500)

## 2017-12-14 NOTE — Progress Notes (Signed)
That combined with recent clotting makes me recommend she see a hematologist.  Could be reactive but she may have some sort of thrombophilia disorder that is manifesting?

## 2017-12-15 ENCOUNTER — Other Ambulatory Visit: Payer: Self-pay | Admitting: Obstetrics and Gynecology

## 2017-12-15 ENCOUNTER — Encounter: Payer: Self-pay | Admitting: Family Medicine

## 2017-12-15 ENCOUNTER — Ambulatory Visit (INDEPENDENT_AMBULATORY_CARE_PROVIDER_SITE_OTHER): Payer: 59 | Admitting: Family Medicine

## 2017-12-15 ENCOUNTER — Other Ambulatory Visit: Payer: Self-pay

## 2017-12-15 ENCOUNTER — Encounter: Payer: Self-pay | Admitting: Hematology

## 2017-12-15 VITALS — BP 120/76 | HR 80 | Ht 64.25 in | Wt 218.4 lb

## 2017-12-15 DIAGNOSIS — Z0000 Encounter for general adult medical examination without abnormal findings: Secondary | ICD-10-CM

## 2017-12-15 DIAGNOSIS — K50018 Crohn's disease of small intestine with other complication: Secondary | ICD-10-CM | POA: Diagnosis not present

## 2017-12-15 DIAGNOSIS — F325 Major depressive disorder, single episode, in full remission: Secondary | ICD-10-CM

## 2017-12-15 DIAGNOSIS — E781 Pure hyperglyceridemia: Secondary | ICD-10-CM

## 2017-12-15 DIAGNOSIS — I2609 Other pulmonary embolism with acute cor pulmonale: Secondary | ICD-10-CM | POA: Diagnosis not present

## 2017-12-15 DIAGNOSIS — D899 Disorder involving the immune mechanism, unspecified: Secondary | ICD-10-CM | POA: Diagnosis not present

## 2017-12-15 DIAGNOSIS — Z139 Encounter for screening, unspecified: Secondary | ICD-10-CM

## 2017-12-15 DIAGNOSIS — R7989 Other specified abnormal findings of blood chemistry: Secondary | ICD-10-CM

## 2017-12-15 DIAGNOSIS — I82411 Acute embolism and thrombosis of right femoral vein: Secondary | ICD-10-CM | POA: Diagnosis not present

## 2017-12-15 DIAGNOSIS — D849 Immunodeficiency, unspecified: Secondary | ICD-10-CM

## 2017-12-15 MED ORDER — FENOFIBRATE 160 MG PO TABS: 160.0000 mg | ORAL_TABLET | Freq: Every day | ORAL | 11 refills | Status: DC

## 2017-12-15 NOTE — Patient Instructions (Signed)
  HEALTH MAINTENANCE RECOMMENDATIONS:  It is recommended that you get at least 30 minutes of aerobic exercise at least 5 days/week (for weight loss, you may need as much as 60-90 minutes). This can be any activity that gets your heart rate up. This can be divided in 10-15 minute intervals if needed, but try and build up your endurance at least once a week.  Weight bearing exercise is also recommended twice weekly.  Eat a healthy diet with lots of vegetables, fruits and fiber.  "Colorful" foods have a lot of vitamins (ie green vegetables, tomatoes, red peppers, etc).  Limit sweet tea, regular sodas and alcoholic beverages, all of which has a lot of calories and sugar.  Up to 1 alcoholic drink daily may be beneficial for women (unless trying to lose weight, watch sugars).  Drink a lot of water.  Calcium recommendations are 1200-1500 mg daily (1500 mg for postmenopausal women or women without ovaries), and vitamin D 1000 IU daily.  This should be obtained from diet and/or supplements (vitamins), and calcium should not be taken all at once, but in divided doses.  Monthly self breast exams and yearly mammograms for women over the age of 58 is recommended. Schedule your mammmogram after your birthday.  Sunscreen of at least SPF 30 should be used on all sun-exposed parts of the skin when outside between the hours of 10 am and 4 pm (not just when at beach or pool, but even with exercise, golf, tennis, and yard work!)  Use a sunscreen that says "broad spectrum" so it covers both UVA and UVB rays, and make sure to reapply every 1-2 hours.  Remember to change the batteries in your smoke detectors when changing your clock times in the spring and fall. I recommend carbon monoxide detector for the home.  Use your seat belt every time you are in a car, and please drive safely and not be distracted with cell phones and texting while driving.  We are referring you to the hematologist as we discussed. Let Verdene Lennert  know about any referrals you need.

## 2017-12-16 ENCOUNTER — Ambulatory Visit (HOSPITAL_COMMUNITY)
Admission: RE | Admit: 2017-12-16 | Discharge: 2017-12-16 | Disposition: A | Discharge status: Home / Self Care | Payer: 59 | Source: Ambulatory Visit | Attending: Vascular Surgery | Admitting: Vascular Surgery

## 2017-12-16 DIAGNOSIS — I82411 Acute embolism and thrombosis of right femoral vein: Secondary | ICD-10-CM | POA: Insufficient documentation

## 2017-12-19 ENCOUNTER — Other Ambulatory Visit: Payer: Self-pay

## 2017-12-19 ENCOUNTER — Encounter: Payer: Self-pay | Admitting: Surgery

## 2017-12-19 ENCOUNTER — Ambulatory Visit (INDEPENDENT_AMBULATORY_CARE_PROVIDER_SITE_OTHER): Payer: 59 | Admitting: Surgery

## 2017-12-19 VITALS — BP 122/80 | HR 85 | Temp 97.8°F | Resp 16 | Ht 64.0 in | Wt 218.0 lb

## 2017-12-19 DIAGNOSIS — I868 Varicose veins of other specified sites: Secondary | ICD-10-CM

## 2017-12-19 DIAGNOSIS — I82401 Acute embolism and thrombosis of unspecified deep veins of right lower extremity: Secondary | ICD-10-CM | POA: Diagnosis not present

## 2017-12-19 NOTE — Progress Notes (Signed)
[facility]  Patient name: Lindsay Mccarthy MRN: [id number] DOB: 1976-10-18 Sex: female   REASON FOR VISIT:    Follow up  HISOTRY OF PRESENT ILLNESS:    Lindsay Mccarthy is a 42 y.o. female who presented to the hospital on 11/27/2017 with a sudden onset of right leg swelling for 2 days.  She is also been having some pleuritic chest pain and shortness of breath.  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  The following day she returned and had AngioJet thrombectomy and balloon venoplasty.  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  Unfortunately, she began developing shortness of breath and was confirmed to have worsening PE by CT angiogram.  This required thrombolyzes and IVC filter placement.  She was ultimately able to be discharged home.  She returns today for follow-up.  She reports that she does not have hardly any swelling in her right leg.  She will wear her compression stockings occasionally.  She still has some shortness of breath with exertion but this is getting better.   PAST MEDICAL HISTORY:   Past Medical History:  Diagnosis Date  . Anemia    related to Crohns flares  . Anxiety   . Arthritis    knees, feet, hands, wrists, related to Crohns flare  . Asthma    Childhood  . B12 deficiency    monitored/treated by Dr. Carlean Purl  . Cervical dysplasia   . Crohn's disease of small intestine ([place]) 06/24/2012   Diagnosed 1999, in [place]. Ileitis only then. Treated with Imuran Remicade and prednisone.  Noncompliant with therapy. 2004 return to care and was treated with Remicade prednisone Pentasa Cipro and Flagyl. 2006 status post right hemicolectomy.  Subsequently treated with azathioprine and Cimzia. 200 mg every other week.  azathioprine was added in 2010. Prometheus TP MT enzyme was negative. Has ha  . Depression   . Eczema    arms and behind knees, worse in winter  . History of recurrent UTI (urinary tract infection)   . HPV (human papilloma virus) infection   . Hyperlipemia   . Osteopenia of femur neck T. score -1.4 09/29/2011   09/2013 T-1.6  . Papanicolaou smear 12/12   last abnormal 2011  . Scaphoid fracture of wrist 2013   left  . Seasonal allergic rhinitis   . Shingles 09/2015   R hip  . Wears glasses      FAMILY HISTORY:   Family History  Problem Relation Age of Onset  . Hypertension Mother   . Breast cancer Mother 77  . Bone cancer Mother        metastatic from breast  . Depression Mother   . Hypothyroidism Mother   . Colon polyps Father   . Diabetes Father        borderline  . Hypertension Father   . Hyperlipidemia Father   . Colon cancer Paternal Grandfather   . Multiple sclerosis Sister   . Alcohol abuse Sister   . Fuch's dystrophy Sister   . Stroke Maternal Grandmother   . Lung cancer Maternal Grandmother   . Hypertension Maternal Grandmother   . Heart disease Paternal Grandmother   . Hypertension Paternal Grandmother     SOCIAL HISTORY:  Social History   Tobacco Use  . Smoking status: Former Smoker    Packs/day: 1.00    Years: 4.00    Pack years: 4.00    Last attempt to quit: 11/18/1998    Years since quitting: 19.0  . Smokeless tobacco: Never Used  Substance Use Topics  . Alcohol use: No    Alcohol/week: 0.0 oz     ALLERGIES:   Allergies  Allergen Reactions  . Ibuprofen Other (See Comments)    Avoids due to Crohns disease  . Morphine And Related Itching    itchy  . Prednisone Other (See Comments)    "crazy", hallucinations  . Adhesive [Tape] Rash    Rash with electrodes     CURRENT MEDICATIONS:   Current Outpatient Medications  Medication Sig Dispense Refill  .  acetaminophen (TYLENOL) 500 MG tablet Take 1,000 mg by mouth every 6 (six) hours as needed for headache (pain). Reported on 12/15/2015    . apixaban (ELIQUIS) 5 MG TABS tablet Take 1 tablet (5 mg total) by mouth 2 (two) times daily. Take 2 tablets BID for 5 days then take 1 tablet BID 72 tablet 0  . aspirin 325 MG tablet Take 325 mg by mouth daily.    Marland Kitchen azaTHIOprine (IMURAN) 50 MG tablet Take 4 tablets by mouth every day 120 tablet 2  . buPROPion (WELLBUTRIN SR) 100 MG 12 hr tablet TAKE 1 TABLET BY MOUTH TWICE A DAY (Patient taking differently: TAKE 1 TABLET (100 MG) BY MOUTH TWICE A DAY) 180 tablet 0  . Calcium-Vitamin D-Vitamin K 244-010-27 MG-UNT-MCG CHEW Chew 2 each by mouth daily.    . cetirizine (ZYRTEC) 10 MG tablet Take 10 mg by mouth daily.    . cyanocobalamin (,VITAMIN B-12,) 1000 MCG/ML injection INJECT AS DIRECTED EVERY 30 DAYS (Patient taking differently: INJECT 1 ML (1000 MCG) INTRAMUSCULARLY EVERY 30 DAYS) 3 mL 4  . diazepam (VALIUM) 5 MG tablet Take 1 tablet (5 mg total) by mouth at bedtime as needed for muscle spasms. 12 tablet 0  . diphenhydrAMINE (SOMINEX) 25 MG tablet Take 25 mg by mouth at bedtime.     Marland Kitchen escitalopram (LEXAPRO) 10 MG tablet TAKE 1 TABLET (10 MG TOTAL) BY MOUTH DAILY. 90 tablet 0  . fenofibrate 160 MG tablet Take 1 tablet (160 mg total) by mouth daily. 30 tablet 11  . ferrous sulfate 325 (65 FE) MG tablet Take 1-2 tablets daily with food - at least 1x/day (Patient taking differently: Take 325 mg by mouth 2 (two) times daily with a meal. )  3  . levonorgestrel (MIRENA) 20 MCG/24HR IUD 1 each by Intrauterine route once. Implanted April 2017    . Omega-3 Fatty Acids (FISH OIL) 1200 MG CAPS Take 1,200 mg by mouth 2 (two) times daily.    . Vedolizumab (ENTYVIO IV) Inject into the vein See admin instructions. Administer once every 8 weeks, ordered by Dr. Carlean Purl (last injection 10/31/17)     No current facility-administered medications for this visit.     REVIEW OF  SYSTEMS:   [X]  denotes positive finding, [ ]  denotes negative finding Cardiac  Comments:  Chest pain or chest pressure:    Shortness of breath upon exertion:    Short of breath when lying flat:    Irregular heart rhythm:        Vascular    Pain in calf, thigh, or hip brought on by ambulation:    Pain in feet at night that wakes you up from your  sleep:     Blood clot in your veins:    Leg swelling:  x       Pulmonary    Oxygen at home:    Productive cough:     Wheezing:         Neurologic    Sudden weakness in arms or legs:     Sudden numbness in arms or legs:     Sudden onset of difficulty speaking or slurred speech:    Temporary loss of vision in one eye:     Problems with dizziness:         Gastrointestinal    Blood in stool:     Vomited blood:         Genitourinary    Burning when urinating:     Blood in urine:        Psychiatric    Major depression:         Hematologic    Bleeding problems:    Problems with blood clotting too easily:        Skin    Rashes or ulcers:        Constitutional    Fever or chills:      PHYSICAL EXAM:   Vitals:   12/19/17 1626  BP: 122/80  Pulse: 85  Resp: 16  Temp: 97.8 F (36.6 C)  TempSrc: Oral  SpO2: 98%  Weight: 218 lb (98.9 kg)  Height: 5' 4"  (1.626 m)    GENERAL: The patient is a well-nourished female, in no acute distress. The vital signs are documented above. CARDIAC: There is a regular rate and rhythm.  VASCULAR: Significant improvement in right leg edema.  Both legs are approximately similar in size. PULMONARY: Non-labored respirations MUSCULOSKELETAL: There are no major deformities or cyanosis. NEUROLOGIC: No focal weakness or paresthesias are detected. SKIN: There are no ulcers or rashes noted. PSYCHIATRIC: The patient has a normal affect.  STUDIES:   Ultrasound today shows partially recanalized right femoral-popliteal vein.  MEDICAL ISSUES:   The patient is recovering nicely from her right leg  DVT and PE.  She will need to remain on anticoagulation for 3-6 months.  She has an appointment to see hematology in March.  Hopefully, a hypercoagulable workup can be completed and we can make a determination as to whether or not she needs lifelong anticoagulation.  Ideally, I would like to have her IVC filter removed.  I will wait to her hematology workup has been completed.  I have her scheduled to see me back in 6 months.  At that time I will plan on addressing her IVC filter.    Annamarie Major, MD [facility] [phone] Pager [phone]

## 2017-12-20 ENCOUNTER — Telehealth (HOSPITAL_COMMUNITY): Payer: Self-pay

## 2017-12-20 NOTE — Telephone Encounter (Signed)
Called to speak with patient in regards to Pulmonary Rehab - Patient is interested in the program. Went over insurance with patient and she understands what she is responsible for. Scheduled orientation on 01/02/2018 at 9:15am. Patient will attend the 1:30pm exc class.

## 2017-12-28 ENCOUNTER — Encounter: Payer: Self-pay | Admitting: Family Medicine

## 2017-12-29 ENCOUNTER — Telehealth (HOSPITAL_COMMUNITY): Payer: Self-pay

## 2017-12-29 ENCOUNTER — Encounter: Payer: Self-pay | Admitting: Internal Medicine

## 2017-12-29 ENCOUNTER — Encounter: Payer: Self-pay | Admitting: *Deleted

## 2017-12-29 NOTE — Telephone Encounter (Signed)
Patient call to speak with Jan to cancel orientation. Patient stated she will if she decides to reschedule. Closed referral.

## 2018-01-02 ENCOUNTER — Ambulatory Visit (HOSPITAL_COMMUNITY): Payer: 59

## 2018-01-03 ENCOUNTER — Inpatient Hospital Stay: Payer: 59 | Attending: Hematology | Admitting: Hematology

## 2018-01-03 ENCOUNTER — Telehealth: Payer: Self-pay | Admitting: Hematology

## 2018-01-03 VITALS — BP 129/83 | HR 94 | Temp 98.6°F | Resp 20 | Ht 64.0 in | Wt 223.0 lb

## 2018-01-03 DIAGNOSIS — D6859 Other primary thrombophilia: Secondary | ICD-10-CM | POA: Insufficient documentation

## 2018-01-03 DIAGNOSIS — D473 Essential (hemorrhagic) thrombocythemia: Secondary | ICD-10-CM | POA: Diagnosis not present

## 2018-01-03 DIAGNOSIS — D649 Anemia, unspecified: Secondary | ICD-10-CM

## 2018-01-03 DIAGNOSIS — I82411 Acute embolism and thrombosis of right femoral vein: Secondary | ICD-10-CM | POA: Insufficient documentation

## 2018-01-03 DIAGNOSIS — D500 Iron deficiency anemia secondary to blood loss (chronic): Secondary | ICD-10-CM | POA: Insufficient documentation

## 2018-01-03 DIAGNOSIS — D75839 Thrombocytosis, unspecified: Secondary | ICD-10-CM

## 2018-01-03 DIAGNOSIS — E538 Deficiency of other specified B group vitamins: Secondary | ICD-10-CM | POA: Insufficient documentation

## 2018-01-03 NOTE — Progress Notes (Signed)
HEMATOLOGY/ONCOLOGY CONSULTATION NOTE  Date of Service: 01/03/2018  Patient Care Team: Rita Ohara, MD as PCP - General (Family Medicine)  CHIEF COMPLAINTS/PURPOSE OF CONSULTATION:  Acute DVT and PE  HISTORY OF PRESENTING ILLNESS:    Lindsay Mccarthy is a wonderful 42 y.o. female who has been referred to [place] by Dr. Tomi Bamberger, her PCP, for evaluation and management of DVT and PE.  Patient reported a significant h/o Crohns disease and noted that she noticed on 11/26/17 that her b/l thighs were heavy swelling and painful. She initially did not have SOB. She presented to the hospital on 11/27/2017 with a sudden onset of right leg swelling for 2 days. She is also been having some pleuritic chest pain and shortness of breath upon exertion. 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 The following day she returned and had AngioJet thrombectomy and balloon venoplasty. She had residual thrombus within her inferior vena cava. Following her procedure, the pain in her right leg had almost completely resolved. Unfortunately, she began developing shortness of breath and was confirmed to have worsening PE by CT angiogram. This required thrombolysis and IVC filter placement.   She had a superficial blood clot in her left calf in 07/2017. She was not on Cimzia at that time. Blood clot went away on it's own. Red "streaking" of the leg and her the pain went away in 6 weeks.  She has been on 38m of aspirin after her superficial blood clot. She was placed on eliquis since her acute DVT and PE.   She denies family history of blood clots. She had not been traveling around that time and was being more active. She was on Birth control in the past and blood clots never occurred. She was never pregnant. Her previous stricture resection surgery did not lead to a blood  clot.   She was diagnosed with Crohn's disease when she was on 21 She reviewed her medication for this. She had a stricture resected 12 years ago. She denies having IV iron or blood transfusions. She has not had a period in 8 years. She is currently on Mirena. Prior to did not have menorrhagia. Last colonoscopy was fall 2018.   On review of symptoms, pt notes she has rarely has GI bleeding. Breathing is back to baseline, only some shortness of breath upon significant exertion.    MEDICAL HISTORY:  Past Medical History:  Diagnosis Date  . Anemia    related to Crohns flares  . Anxiety   . Arthritis    knees, feet, hands, wrists, related to Crohns flare  . Asthma    Childhood  . B12 deficiency    monitored/treated by Dr. GCarlean Purl . Cervical dysplasia   . [place] intestine [place]   [place]. Treated with Imuran Remicade and prednisone.  Noncompliant with therapy. 2004 return to care and was treated with Remicade prednisone Pentasa Cipro and Flagyl. 2006 status post right hemicolectomy. Subsequently treated with azathioprine and Cimzia. 200 mg every other week.  azathioprine was added in 2010. Prometheus TP MT enzyme was negative. Has ha  . Depression   . Eczema    arms and behind knees, worse in winter  . History of recurrent UTI (urinary tract infection)   . HPV (human papilloma virus) infection   .  Hyperlipemia   . Osteopenia of femur neck T. score -1.4 09/29/2011   09/2013 T-1.6  . Papanicolaou smear 12/12   last abnormal 2011  . Scaphoid fracture of wrist 2013   left  . Seasonal allergic rhinitis   . Shingles 09/2015   R hip  . Wears glasses     SURGICAL HISTORY: Past Surgical History:  Procedure Laterality Date  . APPENDECTOMY    . CERVICAL BIOPSY  W/ LOOP ELECTRODE EXCISION  2009   ---paps normal since  . COLONOSCOPY  multiple   scanned  . ESOPHAGOGASTRODUODENOSCOPY  multiple   scanned  .  HEMICOLECTOMY  2006  . IR ANGIOGRAM PULMONARY BILATERAL SELECTIVE  12/03/2017  . IR ANGIOGRAM SELECTIVE EACH ADDITIONAL VESSEL  12/03/2017  . IR ANGIOGRAM SELECTIVE EACH ADDITIONAL VESSEL  12/03/2017  . IR INFUSION THROMBOL ARTERIAL INITIAL (MS)  12/03/2017  . IR INFUSION THROMBOL ARTERIAL INITIAL (MS)  12/03/2017  . IR IVC FILTER PLMT / S&I /IMG GUID/MOD SED  12/04/2017  . IR THROMB F/U EVAL ART/VEN FINAL DAY (MS)  12/04/2017  . IR US GUIDE VASC ACCESS RIGHT  12/03/2017  . LEEP  2011   --done in [place]  . PERIPHERAL VASCULAR BALLOON ANGIOPLASTY Right 11/30/2017   Procedure: PERIPHERAL VASCULAR BALLOON ANGIOPLASTY;  Surgeon: Serafina Mitchell, MD;  Location: [facility];  Service: Cardiovascular;  Laterality: Right;  . PERIPHERAL VASCULAR THROMBECTOMY N/A 11/29/2017   Procedure: PERIPHERAL VASCULAR THROMBECTOMY - THROMBOLYSIS;  Surgeon: Serafina Mitchell, MD;  Location: [facility];  Service: Cardiovascular;  Laterality: N/A;  LYSIS CATHETER PLACEMENT ONLY  . PERIPHERAL VASCULAR THROMBECTOMY N/A 11/30/2017   Procedure: PERIPHERAL VASCULAR THROMBECTOMY - Lysis Recheck;  Surgeon: Serafina Mitchell, MD;  Location: [facility];  Service: Cardiovascular;  Laterality: N/A;  . WISDOM TOOTH EXTRACTION      SOCIAL HISTORY: Social History   Socioeconomic History  . Marital status: Married    Spouse name: Not on file  . Number of children: 0  . Years of education: Not on file  . Highest education level: Not on file  Social Needs  . Financial resource strain: Not on file  . Food insecurity - worry: Not on file  . Food insecurity - inability: Not on file  . Transportation needs - medical: Not on file  . Transportation needs - non-medical: Not on file  Occupational History  . Occupation: Teacher, early years/pre  Tobacco Use  . Smoking status: Former Smoker    Packs/day: 1.00    Years: 4.00    Pack years: 4.00    Last attempt to quit: 11/18/1998    Years since quitting: 19.1  . Smokeless  tobacco: Never Used  Substance and Sexual Activity  . Alcohol use: No    Alcohol/week: 0.0 oz  . Drug use: No  . Sexual activity: Yes    Partners: Male    Birth control/protection: IUD    Comment: Mirena inserted 01/2016  Other Topics Concern  . Not on file  Social History Narrative   The patient is divorced.     Re-married Marcello Moores, partner of 10 years, on 10/10/16. 1 cats, 2 dogs, 1 cat   No children - doesn't want any   [place].   Moved from [place] to [place] in 2013.   Past smoker   No alcohol   1 caffeinated beverages a day   She reports she is compliant with sunscreen given her increased risk of sun damage  and skin cancer on azathioprine      Updated 12/15/17    FAMILY HISTORY: Family History  Problem Relation Age of Onset  . Hypertension Mother   . Breast cancer Mother 34  . Bone cancer Mother        metastatic from breast  . Depression Mother   . Hypothyroidism Mother   . Colon polyps Father   . Diabetes Father        borderline  . Hypertension Father   . Hyperlipidemia Father   . Colon cancer Paternal Grandfather   . Multiple sclerosis Sister   . Alcohol abuse Sister   . Fuch's dystrophy Sister   . Stroke Maternal Grandmother   . Lung cancer Maternal Grandmother   . Hypertension Maternal Grandmother   . Heart disease Paternal Grandmother   . Hypertension Paternal Grandmother     ALLERGIES:  is allergic to ibuprofen; morphine and related; prednisone; and adhesive [tape].  MEDICATIONS:  Current Outpatient Medications  Medication Sig Dispense Refill  . acetaminophen (TYLENOL) 500 MG tablet Take 1,000 mg by mouth every 6 (six) hours as needed for headache (pain). Reported on 12/15/2015    . apixaban (ELIQUIS) 5 MG TABS tablet Take 1 tablet (5 mg total) by mouth 2 (two) times daily. 60 tablet 0  . aspirin 325 MG tablet Take 325 mg by mouth daily.    Marland Kitchen azaTHIOprine (IMURAN) 50 MG tablet Take 4 tablets by mouth every day 120  tablet 2  . buPROPion (WELLBUTRIN SR) 100 MG 12 hr tablet TAKE 1 TABLET BY MOUTH TWICE A DAY (Patient taking differently: TAKE 1 TABLET (100 MG) BY MOUTH TWICE A DAY) 180 tablet 0  . Calcium-Vitamin D-Vitamin K 536-144-31 MG-UNT-MCG CHEW Chew 2 each by mouth daily.    . cetirizine (ZYRTEC) 10 MG tablet Take 10 mg by mouth daily.    . cyanocobalamin (,VITAMIN B-12,) 1000 MCG/ML injection INJECT AS DIRECTED EVERY 30 DAYS (Patient taking differently: INJECT 1 ML (1000 MCG) INTRAMUSCULARLY EVERY 30 DAYS) 3 mL 4  . diazepam (VALIUM) 5 MG tablet Take 1 tablet (5 mg total) by mouth at bedtime as needed for muscle spasms. 12 tablet 0  . diphenhydrAMINE (SOMINEX) 25 MG tablet Take 25 mg by mouth at bedtime.     Marland Kitchen escitalopram (LEXAPRO) 10 MG tablet TAKE 1 TABLET (10 MG TOTAL) BY MOUTH DAILY. 90 tablet 0  . fenofibrate 160 MG tablet Take 1 tablet (160 mg total) by mouth daily. 30 tablet 11  . ferrous sulfate 325 (65 FE) MG tablet Take 1-2 tablets daily with food - at least 1x/day (Patient taking differently: Take 325 mg by mouth 2 (two) times daily with a meal. )  3  . levonorgestrel (MIRENA) 20 MCG/24HR IUD 1 each by Intrauterine route once. Implanted April 2017    . Omega-3 Fatty Acids (FISH OIL) 1200 MG CAPS Take 1,200 mg by mouth 2 (two) times daily.    . Vedolizumab (ENTYVIO IV) Inject into the vein See admin instructions. Administer once every 8 weeks, ordered by Dr. Carlean Purl (last injection 10/31/17)     No current facility-administered medications for this visit.     REVIEW OF SYSTEMS:    10 Point review of Systems was done is negative except as noted above.  PHYSICAL EXAMINATION: ECOG PERFORMANCE STATUS: 1 - Symptomatic but completely ambulatory  . Vitals:   01/03/18 1257  BP: 129/83  Pulse: 94  Resp: 20  Temp: 98.6 F (37 C)  SpO2: 96%  Filed Weights   01/03/18 1257  Weight: 223 lb (101.2 kg)   .Body mass index is 38.28 kg/m.  GENERAL:alert, in no acute distress and  comfortable SKIN: no acute rashes, no significant lesions EYES: conjunctiva are pink and non-injected, sclera anicteric OROPHARYNX: MMM, no exudates, no oropharyngeal erythema or ulceration NECK: supple, no JVD LYMPH:  no palpable lymphadenopathy in the cervical, axillary or inguinal regions LUNGS: clear to auscultation b/l with normal respiratory effort HEART: regular rate & rhythm ABDOMEN:  normoactive bowel sounds , non tender, not distended. Extremity: still notes some minimal RLE swelling PSYCH: alert & oriented x 3 with fluent speech NEURO: no focal motor/sensory deficits  LABORATORY DATA:  I have reviewed the data as listed  . CBC Latest Ref Rng & Units 12/13/2017 12/06/2017 12/05/2017  WBC 3.4 - 10.8 x10E3/uL 8.1 6.0 6.7  Hemoglobin 11.1 - 15.9 g/dL 10.8(L) 7.4(L) 7.4(L)  Hematocrit 34.0 - 46.6 % 35.4 23.6(L) 23.7(L)  Platelets 150 - 379 x10E3/uL 837(HH) 339 299    . CMP Latest Ref Rng & Units 12/13/2017 12/04/2017 12/02/2017  Glucose 65 - 99 mg/dL 87 93 93  BUN 6 - 20 mg/dL 7 5(L) 5(L)  Creatinine 0.57 - 1.00 mg/dL 0.87 0.86 0.87  Sodium 134 - 144 mmol/L 142 141 139  Potassium 3.5 - 5.2 mmol/L 4.4 3.4(L) 3.2(L)  Chloride 96 - 106 mmol/L 105 106 108  CO2 20 - 29 mmol/L 21 23 21(L)  Calcium 8.7 - 10.2 mg/dL 9.4 7.8(L) 7.5(L)  Total Protein 6.0 - 8.5 g/dL 7.2 - -  Total Bilirubin 0.0 - 1.2 mg/dL 0.3 - -  Alkaline Phos 39 - 117 IU/L 54 - -  AST 0 - 40 IU/L 21 - -  ALT 0 - 32 IU/L 15 - -     RADIOGRAPHIC STUDIES: I have personally reviewed the radiological images as listed and agreed with the findings in the report. No results found.  ASSESSMENT & PLAN:   NIAOMI CARTAYA is a 42 y.o. caucasian female with   1. Acute DVT and Submassive PE No definitive provocation of DVT. PE likely from dislodged DVT after mechanical thrombectomy and balloon venoplasty Has IVC filter placed.  On Eliquis since 12/2017 Non smoker. No recent use of hormonal contracept. -we discussed  that her Crohns disease is certainly a risk factor for her VTE .Body mass index is 38.28 kg/m. - obesity could an additional contributing risk factor. 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  2. Elevated platelets  Have been elevated 300-800K for over a year  -likely reactive to iron deficiency anemia and inflammation from Crohn's disease Low likelihood of ET/MPN but would rule this out given significant  Thrombotic event.  3. Iron Deficiency Anemia on ferrous sulfate BID. Tolerating well  4. Crohn's Disease -Mx by Dr. Carlean Purl -On Imuran    PLAN:  -I reviewed the recent scans and labs with the pt.  -I discussed that though there isnt an overt single new provoking factor that's obvious she has several possible linked causes of her DVT/PE such as upregulation of FVIII from loss GI bleeding, Obesity and her Crohn's disease . --Will do labs today for current baseline and to further investigate cause of DVT by checking for a primary hypercoagulable disorder or bone marrow abnormality such as essential thrombocytosis.  -Currently on Eliquis since 12/2017.  -given the extent of her blood clots suggest she  would have to be on anticoagulants for at least 9-12 months if a reversible triggering event is noted. If no reversible/controllable hypercoagulability factor is noted she may be able be placed on long term anticoagulants. -She is okay to stop baby aspirin while on Eliquis.  -She has IVC filter placed during hospitalization. If she is doing well on anticoagulants she can get IVC filter removed in 3-4 months.  -If iron levels are too low will give option of IV iron for more direct intervention.  -I discussed the option of iron polysaccharide that she can take if she is not able to tolerate ferrous sulfate.  -labs ordered for workup . Orders Placed This Encounter  Procedures  . CBC & Diff and  Retic    Standing Status:   Future    Standing Expiration Date:   01/04/2019  . CMP ([facility] only)    Standing Status:   Future    Standing Expiration Date:   01/04/2019  . Ferritin    Standing Status:   Future    Standing Expiration Date:   01/03/2019  . Iron and TIBC    Standing Status:   Future    Standing Expiration Date:   01/03/2019  . Vitamin B12    Standing Status:   Future    Standing Expiration Date:   01/03/2019  . Factor 5 leiden    Standing Status:   Future    Standing Expiration Date:   02/07/2019  . Prothrombin gene mutation    Standing Status:   Future    Standing Expiration Date:   02/07/2019  . Cardiolipin antibodies, IgG, IgM, IgA    Standing Status:   Future    Standing Expiration Date:   01/04/2019  . Beta-2-glycoprotein i abs, IgG/M/A    Standing Status:   Future    Standing Expiration Date:   01/03/2019  . Lupus anticoagulant panel    Standing Status:   Future    Standing Expiration Date:   02/07/2019  . Sedimentation rate    Standing Status:   Future    Standing Expiration Date:   01/03/2019  . JAK2 (including V617F and Exon 12), MPL, and CALR-Next Generation Sequencing    Standing Status:   Future    Standing Expiration Date:   01/03/2019    Labs today RTC with Dr Irene Limbo in 6 months with labs   All of the patients questions were answered with apparent satisfaction. The patient knows to call the clinic with any problems, questions or concerns.  I spent 45 minutes counseling the patient face to face. The total time spent in the appointment was 60 minutes and more than 50% was on counseling and direct patient cares.    Sullivan Lone MD [place] AAHIVMS [facility], [facility] Hematology/Oncology Physician [facility]  (Office):       [phone] (Work cell):  [phone] (Fax):           [phone]  01/03/2018 1:13 PM  This document serves as a record of services personally performed by Sullivan Lone, MD. It was created on his behalf by Joslyn Devon, a trained  medical scribe. The creation of this record is based on the scribe's personal observations and the provider's statements to them.    .I have reviewed the above documentation for accuracy and completeness, and I agree with the above. Brunetta Genera MD MS

## 2018-01-03 NOTE — Patient Instructions (Signed)
Thank you for choosing [facility] to provide your oncology and hematology care.  To afford each patient quality time with our providers, please arrive 30 minutes before your scheduled appointment time.  If you arrive late for your appointment, you may be asked to reschedule.  We strive to give you quality time with our providers, and arriving late affects you and other patients whose appointments are after yours.   If you are a no show for multiple scheduled visits, you may be dismissed from the clinic at the providers discretion.    Again, thank you for choosing [facility], our hope is that these requests will decrease the amount of time that you wait before being seen by our physicians.  ______________________________________________________________________  Should you have questions after your visit to the [facility], please contact our office at [phone] between the hours of 8:30 and 4:30 p.m.    Voicemails left after 4:30p.m will not be returned until the following business day.    For prescription refill requests, please have your pharmacy contact us directly.  Please also try to allow 48 hours for prescription requests.    Please contact the scheduling department for questions regarding scheduling.  For scheduling of procedures such as PET scans, CT scans, MRI, Ultrasound, etc please contact central scheduling at [phone].    Resources For Cancer Patients and Caregivers:   Oncolink.org:  A wonderful resource for patients and healthcare providers for information regarding your disease, ways to tract your treatment, what to expect, etc.     American Cancer Society:  800-227-2345  Can help patients locate various types of support and financial assistance  Cancer Care: 1-800-813-HOPE (4673) Provides financial assistance, online support groups, medication/co-pay assistance.    [place] DSS:  336-641-3447 Where to apply for food  stamps, Medicaid, and utility assistance  Medicare Rights Center: 800-333-4114 Helps people with Medicare understand their rights and benefits, navigate the Medicare system, and secure the quality healthcare they deserve  SCAT: 336-333-6589 [place] [employer]'s shared-ride transportation service for eligible riders who have a disability that prevents them from riding the fixed route bus.    For additional information on assistance programs please contact our social worker:   Grier Hock/Abigail Elmore:  [phone]            

## 2018-01-03 NOTE — Telephone Encounter (Signed)
Scheduled appt per 3/5 los - unable to schedule labs today due to down time - gave patient AVS and calender per los.

## 2018-01-05 ENCOUNTER — Encounter: Payer: Self-pay | Admitting: Family Medicine

## 2018-01-05 ENCOUNTER — Other Ambulatory Visit: Payer: Self-pay | Admitting: Family Medicine

## 2018-01-05 MED ORDER — APIXABAN 5 MG PO TABS: 5.0000 mg | ORAL_TABLET | Freq: Two times a day (BID) | ORAL | 0 refills | Status: DC

## 2018-01-09 ENCOUNTER — Other Ambulatory Visit: Payer: Self-pay

## 2018-01-09 ENCOUNTER — Other Ambulatory Visit (HOSPITAL_COMMUNITY)
Admission: RE | Admit: 2018-01-09 | Discharge: 2018-01-09 | Disposition: A | Discharge status: Home / Self Care | Payer: 59 | Source: Ambulatory Visit | Attending: Obstetrics and Gynecology | Admitting: Obstetrics and Gynecology

## 2018-01-09 ENCOUNTER — Encounter: Payer: Self-pay | Admitting: Family Medicine

## 2018-01-09 ENCOUNTER — Encounter: Payer: Self-pay | Admitting: Obstetrics and Gynecology

## 2018-01-09 ENCOUNTER — Other Ambulatory Visit: Payer: Self-pay | Admitting: *Deleted

## 2018-01-09 ENCOUNTER — Inpatient Hospital Stay: Payer: 59

## 2018-01-09 ENCOUNTER — Other Ambulatory Visit: Payer: 59

## 2018-01-09 ENCOUNTER — Ambulatory Visit (INDEPENDENT_AMBULATORY_CARE_PROVIDER_SITE_OTHER): Payer: 59 | Admitting: Obstetrics and Gynecology

## 2018-01-09 VITALS — BP 110/68 | HR 72 | Resp 16 | Ht 64.0 in | Wt 223.0 lb

## 2018-01-09 DIAGNOSIS — D6859 Other primary thrombophilia: Secondary | ICD-10-CM | POA: Diagnosis not present

## 2018-01-09 DIAGNOSIS — E538 Deficiency of other specified B group vitamins: Secondary | ICD-10-CM

## 2018-01-09 DIAGNOSIS — Z0000 Encounter for general adult medical examination without abnormal findings: Secondary | ICD-10-CM | POA: Diagnosis not present

## 2018-01-09 DIAGNOSIS — Z01419 Encounter for gynecological examination (general) (routine) without abnormal findings: Secondary | ICD-10-CM | POA: Diagnosis not present

## 2018-01-09 DIAGNOSIS — D75839 Thrombocytosis, unspecified: Secondary | ICD-10-CM

## 2018-01-09 DIAGNOSIS — D473 Essential (hemorrhagic) thrombocythemia: Secondary | ICD-10-CM

## 2018-01-09 DIAGNOSIS — D500 Iron deficiency anemia secondary to blood loss (chronic): Secondary | ICD-10-CM

## 2018-01-09 LAB — CMP (CANCER CENTER ONLY)
ALK PHOS: 38 U/L — AB (ref 40–150)
ALT: 10 U/L (ref 0–55)
ANION GAP: 8 (ref 3–11)
AST: 17 U/L (ref 5–34)
Albumin: 3.3 g/dL — ABNORMAL LOW (ref 3.5–5.0)
BILIRUBIN TOTAL: 0.3 mg/dL (ref 0.2–1.2)
BUN: 9 mg/dL (ref 7–26)
CALCIUM: 9.1 mg/dL (ref 8.4–10.4)
CO2: 24 mmol/L (ref 22–29)
CREATININE: 0.83 mg/dL (ref 0.60–1.10)
Chloride: 108 mmol/L (ref 98–109)
GFR, Estimated: >60 mL/min (ref >60)
GLUCOSE: 94 mg/dL (ref 70–140)
Potassium: 4 mmol/L (ref 3.5–5.1)
SODIUM: 140 mmol/L (ref 136–145)
TOTAL PROTEIN: 7.1 g/dL (ref 6.4–8.3)

## 2018-01-09 LAB — SEDIMENTATION RATE: Sed Rate: 18 mm/hr (ref 0–22)

## 2018-01-09 LAB — VITAMIN B12: VITAMIN B 12: 143 pg/mL — AB (ref 180–914)

## 2018-01-09 LAB — IRON AND TIBC
IRON: 70 ug/dL (ref 41–142)
SATURATION RATIOS: 15 % — AB (ref 21–57)
TIBC: 461 ug/dL — AB (ref 236–444)
UIBC: 392 ug/dL

## 2018-01-09 LAB — CBC WITH DIFFERENTIAL (CANCER CENTER ONLY)
BASOS ABS: 0 10*3/uL (ref 0.0–0.1)
BASOS PCT: 0 %
EOS ABS: 0.1 10*3/uL (ref 0.0–0.5)
Eosinophils Relative: 1 %
HCT: 37.1 % (ref 34.8–46.6)
Hemoglobin: 11.8 g/dL (ref 11.6–15.9)
Lymphocytes Relative: 16 %
Lymphs Abs: 1.1 10*3/uL (ref 0.9–3.3)
MCH: 29.8 pg (ref 25.1–34.0)
MCHC: 31.8 g/dL (ref 31.5–36.0)
MCV: 93.7 fL (ref 79.5–101.0)
MONO ABS: 0.5 10*3/uL (ref 0.1–0.9)
Monocytes Relative: 7 %
NEUTROS PCT: 76 %
Neutro Abs: 5.5 10*3/uL (ref 1.5–6.5)
Platelet Count: 427 10*3/uL — ABNORMAL HIGH (ref 145–400)
RBC: 3.96 MIL/uL (ref 3.70–5.45)
RDW: 15.2 % — AB (ref 11.2–14.5)
WBC Count: 7.1 10*3/uL (ref 3.9–10.3)

## 2018-01-09 LAB — RETICULOCYTES
RBC.: 3.96 MIL/uL (ref 3.70–5.45)
RETIC COUNT ABSOLUTE: 63.4 10*3/uL (ref 33.7–90.7)
RETIC CT PCT: 1.6 % (ref 0.7–2.1)

## 2018-01-09 LAB — FERRITIN: Ferritin: 16 ng/mL (ref 9–269)

## 2018-01-09 MED ORDER — APIXABAN 5 MG PO TABS: 5.0000 mg | ORAL_TABLET | Freq: Two times a day (BID) | ORAL | 0 refills | Status: DC

## 2018-01-09 NOTE — Patient Instructions (Signed)

## 2018-01-09 NOTE — Progress Notes (Signed)
42 y.o. G71P0000 Married Caucasian female here for annual exam.    No menses with her Mirena IUD.   Hx DVT/PE.  Presenting symptom was weight in her leg and leg swelling.  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 On Elaquis.   Had CT scan of abdomen and pelvis done during her hospital care.  Left renal cyst 5 cm. Normal reproductive anatomy with IUD in endometrial canal.  Having a work up with hematology.  Thinking that the Crohn's disease is a risk factor.   Traveling to [place] to see her mother who has metastatic breast cancer.  Thinks she may have done genetic testing.   PCP:Eve Kanpp, MD     No LMP recorded. Patient is not currently having periods (Reason: IUD).     Period Cycle (Days): (no cycles with Mirena IUD)     Sexually active: Yes.    The current method of family planning is Mirena IUD inserted 02/12/16.    Exercising: No.   Smoker:  no  Health Maintenance: Pap:  01-13-16 Neg:Neg HR HPV History of abnormal Pap:  Yes, Hx LEEP 2009.  Follow up paps were normal following LEEP MMG:  01-27-16 Diag. Bil.MMG with Rt.Br.US;Density B/Neg/BiRads1/screening age 38/TBC Colonoscopy:  06/22/17  BMD: 10/2016  Result  Osteopenia TDaP:  12/15/15 Gardasil:   n/a HIV: 11/27/17 Negative Hep C: --- Screening Labs:  Hb today: PCP, Urine today: not done   reports that she quit smoking about 19 years ago. She has a 4.00 pack-year smoking history. she has never used smokeless tobacco. She reports that she does not drink alcohol or use drugs.  Past Medical History:  Diagnosis Date  . Anemia    related to Crohns flares  . Anxiety   . Arthritis    knees, feet, hands, wrists, related to Crohns flare  . Asthma    Childhood  . B12 deficiency    monitored/treated by Dr. Carlean Purl  . Cervical dysplasia   . Crohn's disease of small intestine ([place]) 06/24/2012   Diagnosed 1999, in [place]. Ileitis only then. Treated with Imuran Remicade and prednisone.  Noncompliant with therapy. 2004 return to care and  was treated with Remicade prednisone Pentasa Cipro and Flagyl. 2006 status post right hemicolectomy. Subsequently treated with azathioprine and Cimzia. 200 mg every other week.  azathioprine was added in 2010. Prometheus TP MT enzyme was negative. Has ha  . Depression   . DVT of upper extremity (deep vein thrombosis) ([place]) 11/28/2017  . Eczema    arms and behind knees, worse in winter  . History of recurrent UTI (urinary tract infection)   . HPV (human papilloma virus) infection   . Hyperlipemia   . Osteopenia of femur neck T. score -1.4 09/29/2011   09/2013 T-1.6  . Papanicolaou smear 12/12   last abnormal 2011  . Pulmonary embolism ([place]) 11/30/2017  . Scaphoid fracture of wrist 2013   left  . Seasonal allergic rhinitis   . Shingles 09/2015   R hip  . Wears glasses     Past Surgical History:  Procedure Laterality Date  . APPENDECTOMY    . CERVICAL BIOPSY  W/ LOOP ELECTRODE EXCISION  2009   ---paps normal since  . COLONOSCOPY  multiple   scanned  . ESOPHAGOGASTRODUODENOSCOPY  multiple   scanned  . HEMICOLECTOMY  2006  . IR ANGIOGRAM PULMONARY BILATERAL SELECTIVE  12/03/2017  . IR ANGIOGRAM SELECTIVE EACH ADDITIONAL VESSEL  12/03/2017  . IR ANGIOGRAM SELECTIVE EACH ADDITIONAL VESSEL  12/03/2017  .  IR INFUSION THROMBOL ARTERIAL INITIAL (MS)  12/03/2017  . IR INFUSION THROMBOL ARTERIAL INITIAL (MS)  12/03/2017  . IR IVC FILTER PLMT / S&I /IMG GUID/MOD SED  12/04/2017  . IR THROMB F/U EVAL ART/VEN FINAL DAY (MS)  12/04/2017  . IR US GUIDE VASC ACCESS RIGHT  12/03/2017  . LEEP  2011   --done in [place]  . PERIPHERAL VASCULAR BALLOON ANGIOPLASTY Right 11/30/2017   Procedure: PERIPHERAL VASCULAR BALLOON ANGIOPLASTY;  Surgeon: Serafina Mitchell, MD;  Location: [facility];  Service: Cardiovascular;  Laterality: Right;  . PERIPHERAL VASCULAR THROMBECTOMY N/A 11/29/2017   Procedure: PERIPHERAL VASCULAR THROMBECTOMY - THROMBOLYSIS;  Surgeon: Serafina Mitchell, MD;  Location: [facility];   Service: Cardiovascular;  Laterality: N/A;  LYSIS CATHETER PLACEMENT ONLY  . PERIPHERAL VASCULAR THROMBECTOMY N/A 11/30/2017   Procedure: PERIPHERAL VASCULAR THROMBECTOMY - Lysis Recheck;  Surgeon: Serafina Mitchell, MD;  Location: [facility];  Service: Cardiovascular;  Laterality: N/A;  . WISDOM TOOTH EXTRACTION      Current Outpatient Medications  Medication Sig Dispense Refill  . acetaminophen (TYLENOL) 500 MG tablet Take 1,000 mg by mouth every 6 (six) hours as needed for headache (pain). Reported on 12/15/2015    . apixaban (ELIQUIS) 5 MG TABS tablet Take 1 tablet (5 mg total) by mouth 2 (two) times daily. 60 tablet 0  . azaTHIOprine (IMURAN) 50 MG tablet Take 4 tablets by mouth every day 120 tablet 2  . buPROPion (WELLBUTRIN SR) 100 MG 12 hr tablet TAKE 1 TABLET BY MOUTH TWICE A DAY (Patient taking differently: TAKE 1 TABLET (100 MG) BY MOUTH TWICE A DAY) 180 tablet 0  . Calcium-Vitamin D-Vitamin K [id number] MG-UNT-MCG CHEW Chew 2 each by mouth daily.    . cetirizine (ZYRTEC) 10 MG tablet Take 10 mg by mouth daily.    . cyanocobalamin (,VITAMIN B-12,) 1000 MCG/ML injection INJECT AS DIRECTED EVERY 30 DAYS (Patient taking differently: INJECT 1 ML (1000 MCG) INTRAMUSCULARLY EVERY 30 DAYS) 3 mL 4  . diphenhydrAMINE (SOMINEX) 25 MG tablet Take 25 mg by mouth at bedtime.     Marland Kitchen escitalopram (LEXAPRO) 10 MG tablet TAKE 1 TABLET (10 MG TOTAL) BY MOUTH DAILY. 90 tablet 0  . fenofibrate 160 MG tablet Take 1 tablet (160 mg total) by mouth daily. 30 tablet 11  . ferrous sulfate 325 (65 FE) MG tablet Take 1-2 tablets daily with food - at least 1x/day (Patient taking differently: Take 325 mg by mouth 2 (two) times daily with a meal. )  3  . levonorgestrel (MIRENA) 20 MCG/24HR IUD 1 each by Intrauterine route once. Implanted April 2017    . Omega-3 Fatty Acids (FISH OIL) 1200 MG CAPS Take 1,200 mg by mouth 2 (two) times daily.    . Vedolizumab (ENTYVIO IV) Inject into the vein See admin  instructions. Administer once every 8 weeks, ordered by Dr. Carlean Purl (last injection 10/31/17)     No current facility-administered medications for this visit.     Family History  Problem Relation Age of Onset  . Hypertension Mother   . Breast cancer Mother 82  . Bone cancer Mother        metastatic from breast  . Depression Mother   . Hypothyroidism Mother   . Colon polyps Father   . Diabetes Father        borderline  . Hypertension Father   . Hyperlipidemia Father   . Colon cancer Paternal Grandfather   . Multiple sclerosis  Sister   . Alcohol abuse Sister   . Fuch's dystrophy Sister   . Stroke Maternal Grandmother   . Lung cancer Maternal Grandmother   . Hypertension Maternal Grandmother   . Heart disease Paternal Grandmother   . Hypertension Paternal Grandmother     ROS:  Pertinent items are noted in HPI.  Otherwise, a comprehensive ROS was negative.  Exam:   BP 110/68 (BP Location: Right Arm, Patient Position: Sitting, Cuff Size: Normal)   Pulse 72   Resp 16   Ht 5' 4"  (1.626 m)   Wt 223 lb (101.2 kg)   BMI 38.28 kg/m     General appearance: alert, cooperative and appears stated age Head: Normocephalic, without obvious abnormality, atraumatic Neck: no adenopathy, supple, symmetrical, trachea midline and thyroid normal to inspection and palpation Lungs: clear to auscultation bilaterally Breasts: normal appearance, no masses or tenderness, No nipple retraction or dimpling, No nipple discharge or bleeding, No axillary or supraclavicular adenopathy Heart: regular rate and rhythm Abdomen: obese, soft, non-tender; no masses, no organomegaly Extremities: Compression hose on.  Extremities normal, atraumatic, no cyanosis or edema Skin: Skin color, texture, turgor normal. No rashes or lesions Lymph nodes: Cervical, supraclavicular, and axillary nodes normal. No abnormal inguinal nodes palpated Neurologic: Grossly normal  Pelvic: External genitalia:  no lesions               Urethra:  normal appearing urethra with no masses, tenderness or lesions              Bartholins and Skenes: normal                 Vagina: normal appearing vagina with normal color and discharge, no lesions              Cervix: no lesions.  IUD strings noted.              Pap taken: Yes.   Bimanual Exam:  Uterus:  normal size, contour, position, consistency, mobility, non-tender.  Limited bimanual exam due to [facility].              Adnexa: no mass, fullness, tenderness              Rectal exam: No.  Chaperone was present for exam.  Assessment:   Well woman visit with normal exam. Mirena IUD. Hx LEEP. Hx osteopenia.  FH metastatic breast cancer.  Mother.  Crohn's.  On immunosuppressive medication.  Hx DVT/PE.  Plan: Mammogram screening discussed. Recommended self breast awareness. Pap and HR HPV as above.  Pap yearly. Guidelines for Calcium, Vitamin D, regular exercise program including cardiovascular and weight bearing exercise.   Follow up annually and prn.   After visit summary provided.

## 2018-01-10 LAB — CARDIOLIPIN ANTIBODIES, IGG, IGM, IGA
ANTICARDIOLIPIN IGM: 13 [MPL'U]/mL — AB (ref 0–12)
Anticardiolipin IgA: <9 APL U/mL (ref 0–11)
Anticardiolipin IgG: <9 GPL U/mL (ref 0–14)

## 2018-01-11 LAB — CYTOLOGY - PAP
Diagnosis: NEGATIVE
HPV: NOT DETECTED

## 2018-01-11 LAB — LUPUS ANTICOAGULANT PANEL
DRVVT: 53.9 s — ABNORMAL HIGH (ref 0.0–47.0)
PTT LA: 34.3 s (ref 0.0–51.9)

## 2018-01-11 LAB — BETA-2-GLYCOPROTEIN I ABS, IGG/M/A
BETA 2 GLYCO I IGG: 9 GPI IgG units (ref 0–20)
Beta-2-Glycoprotein I IgA: <9 GPI IgA units (ref 0–25)
Beta-2-Glycoprotein I IgM: <9 GPI IgM units (ref 0–32)

## 2018-01-11 LAB — DRVVT MIX: dRVVT Mix: 44.7 s (ref 0.0–47.0)

## 2018-01-13 LAB — FACTOR 5 LEIDEN

## 2018-01-16 ENCOUNTER — Encounter: Payer: Self-pay | Admitting: Family Medicine

## 2018-01-16 LAB — PROTHROMBIN GENE MUTATION

## 2018-01-19 ENCOUNTER — Encounter: Payer: Self-pay | Admitting: Hematology

## 2018-01-26 LAB — JAK2 (INCLUDING V617F AND EXON 12), MPL,& CALR-NEXT GEN SEQ

## 2018-01-27 ENCOUNTER — Ambulatory Visit: Payer: 59 | Admitting: Obstetrics and Gynecology

## 2018-01-27 ENCOUNTER — Ambulatory Visit: Payer: 59

## 2018-02-01 ENCOUNTER — Telehealth: Payer: Self-pay

## 2018-02-01 NOTE — Telephone Encounter (Signed)
MyChart message sent to patient and f/u on the phone regarding results and recommendations.  "Ms. Lindsay Mccarthy,  This is Lindsay Mccarthy, one of Dr. Grier Mitts nurses. I spoke with you on the phone last week regarding recommendations and results from Dr. Irene Limbo, and remember you saying that you were out of town for a period of time and would not be able to come in for a visit. If you would like to follow-up upon your return please call scheduling at [phone]. Results are as follows:   Prothrombin gene mutation/Factor V leiden mutation negative, Anti-phoshopholipid ab negative. Jak2 testing results negative. Lupus negative.  Based on results, elevated platelets likely had a reactive cause due to inflammation from Crohn's and elevated iron. Platelets have dropped to 427 from 837 which is a change in the right direction. Recommendations at this point are:  1. Schedule a follow-up to monitor iron and B12 levels 2. Start taking Vitamin B12 2040mg sublingual (under the tongue) - you can get this medication over-the-counter 3. Consider IV iron replacement x 2 doses - this is more likely to be absorbed than oral iron  Please let u[place]now what you would like to do.  Sincerely,   SAldona Bar RN"  Pt has been taking B12 injections and they are typically given at the end of the month, so this was the reason why her B12 was low at the time. PCP is monitoring pt B12 levels. Pt willing to come in after her return at the end of the month for IV iron doses x 2. Scheduling message sent to add pt for iron in infusion.

## 2018-02-03 ENCOUNTER — Telehealth: Payer: Self-pay | Admitting: Family Medicine

## 2018-02-03 ENCOUNTER — Other Ambulatory Visit: Payer: Self-pay | Admitting: Family Medicine

## 2018-02-03 ENCOUNTER — Encounter: Payer: Self-pay | Admitting: Family Medicine

## 2018-02-03 DIAGNOSIS — F325 Major depressive disorder, single episode, in full remission: Secondary | ICD-10-CM

## 2018-02-03 NOTE — Telephone Encounter (Signed)
New Message   *STAT* If patient is at the pharmacy, call can be transferred to refill team.   1. Which medications need to be refilled? (please list name of each medication and dose if known)  bupropion 100 mg 12hr tablet twice daily  2. Which pharmacy/location (including street and city if local pharmacy) is medication to be sent to? [id number] - [place], [address]  3. Do they need a 30 day or 90 day supply?  90 day supply

## 2018-02-03 NOTE — Telephone Encounter (Signed)
Please see pt advise request

## 2018-02-03 NOTE — Telephone Encounter (Signed)
Pt pharmacy is requesting bupropin be sent in on her behalf. Pharmacy is [facility] on [address]. Thanks [employer]

## 2018-02-04 MED ORDER — BUPROPION HCL ER (SR) 100 MG PO TB12: ORAL_TABLET | ORAL | 0 refills | Status: DC

## 2018-02-06 NOTE — Telephone Encounter (Signed)
Dr.Lalonde sent on 02/04/18.

## 2018-02-07 ENCOUNTER — Ambulatory Visit: Payer: 59 | Admitting: Internal Medicine

## 2018-02-08 ENCOUNTER — Encounter: Payer: Self-pay | Admitting: Hematology

## 2018-02-08 ENCOUNTER — Encounter: Payer: Self-pay | Admitting: Internal Medicine

## 2018-02-13 ENCOUNTER — Other Ambulatory Visit: Payer: Self-pay

## 2018-02-13 MED ORDER — VEDOLIZUMAB 300 MG IV SOLR: 300.0000 mg | INTRAVENOUS | 6 refills | Status: DC

## 2018-02-14 ENCOUNTER — Telehealth: Payer: Self-pay | Admitting: Hematology

## 2018-02-14 NOTE — Telephone Encounter (Signed)
Appointment scheduled patient notified per 4/11 sch msg

## 2018-02-22 ENCOUNTER — Encounter: Payer: Self-pay | Admitting: Family Medicine

## 2018-02-22 ENCOUNTER — Ambulatory Visit (INDEPENDENT_AMBULATORY_CARE_PROVIDER_SITE_OTHER): Payer: 59 | Admitting: Family Medicine

## 2018-02-22 VITALS — BP 124/74 | HR 72 | Temp 99.2°F | Ht 64.0 in | Wt 227.6 lb

## 2018-02-22 DIAGNOSIS — R296 Repeated falls: Secondary | ICD-10-CM | POA: Diagnosis not present

## 2018-02-22 DIAGNOSIS — Z8739 Personal history of other diseases of the musculoskeletal system and connective tissue: Secondary | ICD-10-CM | POA: Diagnosis not present

## 2018-02-22 DIAGNOSIS — Z820 Family history of epilepsy and other diseases of the nervous system: Secondary | ICD-10-CM | POA: Diagnosis not present

## 2018-02-22 DIAGNOSIS — R32 Unspecified urinary incontinence: Secondary | ICD-10-CM

## 2018-02-22 DIAGNOSIS — D224 Melanocytic nevi of scalp and neck: Secondary | ICD-10-CM | POA: Diagnosis not present

## 2018-02-22 DIAGNOSIS — R269 Unspecified abnormalities of gait and mobility: Secondary | ICD-10-CM | POA: Diagnosis not present

## 2018-02-22 LAB — POCT URINALYSIS DIP (PROADVANTAGE DEVICE)
Bilirubin, UA: NEGATIVE
Glucose, UA: NEGATIVE mg/dL
Ketones, POC UA: NEGATIVE mg/dL
Leukocytes, UA: NEGATIVE
NITRITE UA: NEGATIVE
PH UA: 6 (ref 5.0–8.0)
Specific Gravity, Urine: 1.03
UUROB: NEGATIVE

## 2018-02-22 NOTE — Patient Instructions (Addendum)
We are referring you to neurology for further consult regarding your various neurologic complaints.  I think you will need physical therapy to help with balance and decrease your fall risk.  I didn't see any obvious weakness or deficit on exam.  Imaging procedures may be needed, and I will leave that up to the neurologist.  The bladder incontinence issue sounds like spasms from overactive bladder. Read the info below.  Try and keep your bladder from getting too full. There are medications that potentially can be used if this isn't improving.  Keep an eye on the lesion on the scalp.  It doesn't appear concerning to me, but if it changes in shape, size or color, it should be checked out again.   Overactive Bladder, Adult Overactive bladder is a group of urinary symptoms. With overactive bladder, you may suddenly feel the need to pass urine (urinate) right away. After feeling this sudden urge, you might also leak urine if you cannot get to the bathroom fast enough (urinary incontinence). These symptoms might interfere with your daily work or social activities. Overactive bladder symptoms may also wake you up at night. Overactive bladder affects the nerve signals between your bladder and your brain. Your bladder may get the signal to empty before it is full. Very sensitive muscles can also make your bladder squeeze too soon. What are the causes? Many things can cause an overactive bladder. Possible causes include:  Urinary tract infection.  Infection of nearby tissues, such as the prostate.  Prostate enlargement.  Being pregnant with twins or more (multiples).  Surgery on the uterus or urethra.  Bladder stones, inflammation, or tumors.  Drinking too much caffeine or alcohol.  Certain medicines, especially those that you take to help your body get rid of extra fluid (diuretics) by increasing urine production.  Muscle or nerve weakness, especially from: ? A spinal cord  injury. ? Stroke. ? Multiple sclerosis. ? Parkinson disease.  Diabetes. This can cause a high urine volume that fills the bladder so quickly that the normal urge to urinate is triggered very strongly.  Constipation. A buildup of too much stool can put pressure on your bladder.  What increases the risk? You may be at greater risk for overactive bladder if you:  Are an older adult.  Smoke.  Are going through menopause.  Have prostate problems.  Have a neurological disease, such as stroke, dementia, Parkinson disease, or multiple sclerosis (MS).  Eat or drink things that irritate the bladder. These include alcohol, spicy food, and caffeine.  Are overweight or obese.  What are the signs or symptoms? The signs and symptoms of an overactive bladder include:  Sudden, strong urges to urinate.  Leaking urine.  Urinating eight or more times per day.  Waking up to urinate two or more times per night.  How is this diagnosed? Your health care provider may suspect overactive bladder based on your symptoms. The health care provider will do a physical exam and take your medical history. Blood or urine tests may also be done. For example, you might need to have a bladder function test to check how well you can hold your urine. You might also need to see a health care provider who specializes in the urinary tract (urologist). How is this treated? Treatment for overactive bladder depends on the cause of your condition and whether it is mild or severe. Certain treatments can be done in your health care provider's office or clinic. You can also make lifestyle changes  at home. Options include: Behavioral Treatments  Biofeedback. A specialist uses sensors to help you become aware of your body's signals.  Keeping a daily log of when you need to urinate and what happens after the urge. This may help you manage your condition.  Bladder training. This helps you learn to control the urge to  urinate by following a schedule that directs you to urinate at regular intervals (timed voiding). At first, you might have to wait a few minutes after feeling the urge. In time, you should be able to schedule bathroom visits an hour or more apart.  Kegel exercises. These are exercises to strengthen the pelvic floor muscles, which support the bladder. Toning these muscles can help you control urination, even if your bladder muscles are overactive. A specialist will teach you how to do these exercises correctly. They require daily practice.  Weight loss. If you are obese or overweight, losing weight might relieve your symptoms of overactive bladder. Talk to your health care provider about losing weight and whether there is a specific program or method that would work best for you.  Diet change. This might help if constipation is making your overactive bladder worse. Your health care provider or a dietitian can explain ways to change what you eat to ease constipation. You might also need to consume less alcohol and caffeine or drink other fluids at different times of the day.  Stopping smoking.  Wearing pads to absorb leakage while you wait for other treatments to take effect. Physical Treatments  Electrical stimulation. Electrodes send gentle pulses of electricity to strengthen the nerves or muscles that help to control the bladder. Sometimes, the electrodes are placed outside of the body. In other cases, they might be placed inside the body (implanted). This treatment can take several months to have an effect.  Supportive devices. Women may need a plastic device that fits into the vagina and supports the bladder (pessary). Medicines Several medicines can help treat overactive bladder and are usually used along with other treatments. Some are injected into the muscles involved in urination. Others come in pill form. Your health care provider may prescribe:  Antispasmodics. These medicines block the  signals that the nerves send to the bladder. This keeps the bladder from releasing urine at the wrong time.  Tricyclic antidepressants. These types of antidepressants also relax bladder muscles.  Surgery  You may have a device implanted to help manage the nerve signals that indicate when you need to urinate.  You may have surgery to implant electrodes for electrical stimulation.  Sometimes, very severe cases of overactive bladder require surgery to change the shape of the bladder. Follow these instructions at home:  Take medicines only as directed by your health care provider.  Use any implants or a pessary as directed by your health care provider.  Make any diet or lifestyle changes that are recommended by your health care provider. These might include: ? Drinking less fluid or drinking at different times of the day. If you need to urinate often during the night, you may need to stop drinking fluids early in the evening. ? Cutting down on caffeine or alcohol. Both can make an overactive bladder worse. Caffeine is found in coffee, tea, and sodas. ? Doing Kegel exercises to strengthen muscles. ? Losing weight if you need to. ? Eating a healthy and balanced diet to prevent constipation.  Keep a journal or log to track how much and when you drink and also when you feel  the need to urinate. This will help your health care provider to monitor your condition. Contact a health care provider if:  Your symptoms do not get better after treatment.  Your pain and discomfort are getting worse.  You have more frequent urges to urinate.  You have a fever. Get help right away if: You are not able to control your bladder at all. This information is not intended to replace advice given to you by your health care provider. Make sure you discuss any questions you have with your health care provider. Document Released: 08/14/2009 Document Revised: 03/25/2016 Document Reviewed: 03/13/2014 Elsevier  Interactive Patient Education  [employer].

## 2018-02-22 NOTE — Progress Notes (Signed)
Chief Complaint  Patient presents with  . Consult    mole on scalp, noticed by sister 3 weeks ago-so unsure of how long it has actually been there. Does she derm yearly and never been made aware of this and appt is not until end of year.   . Urinary Incontinence    over the last few years she will have an episode of incontinence, where he whole bladder empties withour warning. This is one of the reasons she would like to be referred to neuro to be tested for MS. Also leg pains at night, groin pains, legs fell heavy-worse when cold or stressed, feet drag, eye twitching(usually right), horrible balance as well as clumsy.     Her sister recently noted a mole on her scalp.  She sees derm yearly in November (Dr. Fontaine No), who does check her scalp, but she doesn't recall anything being in this location.   Unsure if she needs something done sooner than November.  Denies itching, pain.  Her sister took a Clinical research associate with her phone (which she left in the car).  Urinary incontinence--has been occurring once or twice a month, large volume. She is usually home, often standing. Sometimes occurs while washing dishes with the water running, or related to a lot of stress.  Happened when in [place] 5 times (under a lot of stress related to her mother's health). She drank less caffeine when there, no alcohol. Denies dysuria, hematuria, urgencyfrequency.  She is concerned that this incontincne, as well as some other vague neuro symptoms she has had could be MS, as her sister has MS. She has a hard time seeing to drive at night, no other vision changes. She has had some numbness/tingling in hands in the mornings or after work in the evenings. "I've walked funny for about 10 years".  Had a pinched nerve in back, chiropractor said she had dropped foot in her left leg.  Recalls having an MRI done. She feels like her walking has gotten worse, trips easily over her own feet.  She falls a lot, but now is concerned due to  her being on blood thinners.  She has to hold on when she walks on the treadmill (she has fallen in the past on the treadmill). She is now motivated to get on treadmill to try and lose weight.  She tried some braces for her left foot, which help some, but hard to wear in most shoes. She had PT 10 years ago related to the pinched nerve. She didn't think it helped much with her walking.  PMH, PSH, SH and FH reviewed and updated.  Her sister has MS (some vision issues, but also has Fuch's, mostly has cognitive issues, fatigue).  Mother is on Hospice, due to liver failure (has metastatic breast cancer)  Outpatient Encounter Medications as of 02/22/2018  Medication Sig Note  . azaTHIOprine (IMURAN) 50 MG tablet Take 4 tablets by mouth every day   . buPROPion (WELLBUTRIN SR) 100 MG 12 hr tablet TAKE 1 TABLET (100 MG) BY MOUTH TWICE A DAY   . Calcium-Vitamin D-Vitamin K 338-329-19 MG-UNT-MCG CHEW Chew 2 each by mouth daily.   . cetirizine (ZYRTEC) 10 MG tablet Take 10 mg by mouth daily.   . cyanocobalamin (,VITAMIN B-12,) 1000 MCG/ML injection INJECT AS DIRECTED EVERY 30 DAYS (Patient taking differently: INJECT 1 ML (1000 MCG) INTRAMUSCULARLY EVERY 30 DAYS) 11/27/2017: Last injection 11/01/17  . diphenhydrAMINE (SOMINEX) 25 MG tablet Take 25 mg by mouth at bedtime.    Marland Kitchen  ELIQUIS 5 MG TABS tablet TAKE 1 TABLET(5 MG) BY MOUTH TWICE DAILY   . escitalopram (LEXAPRO) 10 MG tablet TAKE 1 TABLET (10 MG TOTAL) BY MOUTH DAILY.   . fenofibrate 160 MG tablet Take 1 tablet (160 mg total) by mouth daily.   . ferrous sulfate 325 (65 FE) MG tablet Take 1-2 tablets daily with food - at least 1x/day (Patient taking differently: Take 325 mg by mouth 2 (two) times daily with a meal. )   . levonorgestrel (MIRENA) 20 MCG/24HR IUD 1 each by Intrauterine route once. Implanted April 2017   . Omega-3 Fatty Acids (FISH OIL) 1200 MG CAPS Take 1,200 mg by mouth 2 (two) times daily.   . Probiotic Product (ALIGN JR FOR KIDS) CHEW  Chew 1 tablet by mouth daily.   . vedolizumab (ENTYVIO) 300 MG injection Inject 300 mg into the vein every 8 (eight) weeks.   Marland Kitchen acetaminophen (TYLENOL) 500 MG tablet Take 1,000 mg by mouth every 6 (six) hours as needed for headache (pain). Reported on 12/15/2015    No facility-administered encounter medications on file as of 02/22/2018.    Allergies  Allergen Reactions  . Ibuprofen Other (See Comments)    Avoids due to Crohns disease  . Morphine And Related Itching    itchy  . Prednisone Other (See Comments)    "crazy", hallucinations  . Adhesive [Tape] Rash    Rash with electrodes   ROS:  No known fever, chills, dizziness, vision loss, headaches, or neurologic symptoms other than those noted in HPI.  No URI symptoms, chest pain, shortness of breath (resolved).  RLE pain improved, denies swelling.  Denies bleeding/bruising. Bowels stable. Incontinence (urge/urinary) per HPI.  Moods stable   PHYSICAL EXAM:  BP 124/74   Pulse 72   Temp 99.2 F (37.3 C) (Tympanic)   Ht 5' 4"  (1.626 m)   Wt 227 lb 9.6 oz (103.2 kg)   BMI 39.07 kg/m   Wt Readings from Last 3 Encounters:  02/22/18 227 lb 9.6 oz (103.2 kg)  01/09/18 223 lb (101.2 kg)  01/03/18 223 lb (101.2 kg)   Well developed, pleasant female, in good spirits. HEENT: Center of scalp, posteriorly (anterior to the crown) there is a slightly raised, flesh-colored area, somewhat irregular in shape, approximately 1cm in size.  Nontender, no erythema, flaking.  Normal hair growth in this area. Conjunctiva and sclera are clear, EOMI Neck: no lymphadenopathy, thyromegaly or mass Heart: regular rate and rhythm Lungs: clear bilaterally Back: no CVA tenderness Abdomen: soft, nontender, no mass Extremities: no edema, normal pulse Neuro: alert and oriented, cranial nerves intact. Normal strength, sensation, DTR's. Negative SLR. Trouble with tandem gait/balance (somewhat widened, struggled, no falls). Negative romberg. Strength intact, no  foot drop or weakness noted (intact strength with walking on heels and toes) Skin: no bruising, normal turgor, no rashes. Scalp lesion per HEENT section  Urine dip: notable for trace blood only (chronic per pt) and trace protein.  Negative leuks/nitrite   ASSESSMENT/PLAN:   Urinary incontinence, unspecified type - suspect urge incontinence. Reviewed behavioral measures. More frequent w/stress, bad recently. Consider meds if not improving. - Plan: POCT Urinalysis DIP (Proadvantage Device), Ambulatory referral to Neurology  Abnormal gait - trouble with tandem gait, balance. Would benefit from PT, but will await neuro's input - Plan: Ambulatory referral to Neurology  Frequent falls - pt on anticoagulants, so important to minimize risk of falls/complications. Discussed home safety. 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 Would like to see neuro - Plan: Ambulatory referral to Neurology  History of left foot drop - no significant weakness noted on exam today - Plan: Ambulatory referral to Neurology  Nevus of scalp - not pigmented or inflamed. Sister took picture. Monitor for any change, and f/u w/derm sooner if changing. Appears benign, just large.  Counseled re: beavioral measures for overactive bladder

## 2018-02-23 ENCOUNTER — Encounter: Payer: Self-pay | Admitting: Neurology

## 2018-03-08 ENCOUNTER — Other Ambulatory Visit: Payer: Self-pay | Admitting: Family Medicine

## 2018-03-08 NOTE — Telephone Encounter (Signed)
Left message to call back  

## 2018-03-09 NOTE — Telephone Encounter (Signed)
Left message again.

## 2018-03-13 ENCOUNTER — Other Ambulatory Visit: Payer: Self-pay | Admitting: Internal Medicine

## 2018-03-13 NOTE — Progress Notes (Signed)
HEMATOLOGY/ONCOLOGY CONSULTATION NOTE  Date of Service: 03/14/2018  Patient Care Team: Rita Ohara, MD as PCP - General (Family Medicine)  CHIEF COMPLAINTS/PURPOSE OF CONSULTATION:  F/u for Acute DVT and PE  HISTORY OF PRESENTING ILLNESS:    Lindsay Mccarthy is a wonderful 42 y.o. female who has been referred to [place] by Dr. Tomi Bamberger, her PCP, for evaluation and management of DVT and PE.  Patient reported a significant h/o Crohns disease and noted that she noticed on 11/26/17 that her b/l thighs were heavy swelling and painful. She initially did not have SOB. She presented to the hospital on 11/27/2017 with a sudden onset of right leg swelling for 2 days. She is also been having some pleuritic chest pain and shortness of breath upon exertion. 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 The following day she returned and had AngioJet thrombectomy and balloon venoplasty. She had residual thrombus within her inferior vena cava. Following her procedure, the pain in her right leg had almost completely resolved. Unfortunately, she began developing shortness of breath and was confirmed to have worsening PE by CT angiogram. This required thrombolysis and IVC filter placement.   She had a superficial blood clot in her left calf in 07/2017. She was not on Cimzia at that time. Blood clot went away on it's own. Red "streaking" of the leg and her the pain went away in 6 weeks.  She has been on 385m of aspirin after her superficial blood clot. She was placed on eliquis since her acute DVT and PE.   She denies family history of blood clots. She had not been traveling around that time and was being more active. She was on Birth control in the past and blood clots never occurred. She was never pregnant. Her previous stricture resection surgery did not lead to a  blood clot.   She was diagnosed with Crohn's disease when she was on 89 She reviewed her medication for this. She had a stricture resected 12 years ago. She denies having IV iron or blood transfusions. She has not had a period in 8 years. She is currently on Mirena. Prior to did not have menorrhagia. Last colonoscopy was fall 2018.   On review of symptoms, pt notes she has rarely has GI bleeding. Breathing is back to baseline, only some shortness of breath upon significant exertion.    INTERVAL HISTORY   Lindsay URIETAreturns for a follow up of her acute DVT and PE. She presents to the clinic today by herself. She notes she recently went to see her mother who is in hospice for her stage 4 cancer. She notes she is on ferrous sulfate still. She is on imuran and entyvio for her Crohn's Disease. She notes she has returned to work where she sits a lot. She notes she is stretching as much as possible and drinking more water.   On review of symptoms, pt notes she is fatigued and tired. She denies abdominal pain or leg pain. She has not had leg swelling even without compression sock. She notes popliteal pain on her right leg, but this is manageable. She notes some SOB due to humidity. She denies pain in her spine.    MEDICAL HISTORY:  Past Medical History:  Diagnosis Date  . Anemia    related to Crohns flares  .  Anxiety   . Arthritis    knees, feet, hands, wrists, related to Crohns flare  . Asthma    Childhood  . B12 deficiency    monitored/treated by Dr. Carlean Purl  . Cervical dysplasia   . Crohn's disease of small intestine ([place]) 06/24/2012   Diagnosed 1999, in [place]. Ileitis only then. Treated with Imuran Remicade and prednisone.  Noncompliant with therapy. 2004 return to care and was treated with Remicade prednisone Pentasa Cipro and Flagyl. 2006 status post right hemicolectomy. Subsequently treated with azathioprine and Cimzia. 200 mg every other week.  azathioprine was added in 2010.  Prometheus TP MT enzyme was negative. Has ha  . Depression   . DVT of upper extremity (deep vein thrombosis) ([place]) 11/28/2017  . Eczema    arms and behind knees, worse in winter  . History of recurrent UTI (urinary tract infection)   . HPV (human papilloma virus) infection   . Hyperlipemia   . Osteopenia of femur neck T. score -1.4 09/29/2011   09/2013 T-1.6  . Papanicolaou smear 12/12   last abnormal 2011  . Pulmonary embolism ([place]) 11/30/2017  . Scaphoid fracture of wrist 2013   left  . Seasonal allergic rhinitis   . Shingles 09/2015   R hip  . Wears glasses     SURGICAL HISTORY: Past Surgical History:  Procedure Laterality Date  . APPENDECTOMY    . CERVICAL BIOPSY  W/ LOOP ELECTRODE EXCISION  2009   ---paps normal since  . COLONOSCOPY  multiple   scanned  . ESOPHAGOGASTRODUODENOSCOPY  multiple   scanned  . HEMICOLECTOMY  2006  . IR ANGIOGRAM PULMONARY BILATERAL SELECTIVE  12/03/2017  . IR ANGIOGRAM SELECTIVE EACH ADDITIONAL VESSEL  12/03/2017  . IR ANGIOGRAM SELECTIVE EACH ADDITIONAL VESSEL  12/03/2017  . IR INFUSION THROMBOL ARTERIAL INITIAL (MS)  12/03/2017  . IR INFUSION THROMBOL ARTERIAL INITIAL (MS)  12/03/2017  . IR IVC FILTER PLMT / S&I /IMG GUID/MOD SED  12/04/2017  . IR THROMB F/U EVAL ART/VEN FINAL DAY (MS)  12/04/2017  . IR US GUIDE VASC ACCESS RIGHT  12/03/2017  . LEEP  2011   --done in [place]  . PERIPHERAL VASCULAR BALLOON ANGIOPLASTY Right 11/30/2017   Procedure: PERIPHERAL VASCULAR BALLOON ANGIOPLASTY;  Surgeon: Serafina Mitchell, MD;  Location: [facility];  Service: Cardiovascular;  Laterality: Right;  . PERIPHERAL VASCULAR THROMBECTOMY N/A 11/29/2017   Procedure: PERIPHERAL VASCULAR THROMBECTOMY - THROMBOLYSIS;  Surgeon: Serafina Mitchell, MD;  Location: [facility];  Service: Cardiovascular;  Laterality: N/A;  LYSIS CATHETER PLACEMENT ONLY  . PERIPHERAL VASCULAR THROMBECTOMY N/A 11/30/2017   Procedure: PERIPHERAL VASCULAR THROMBECTOMY - Lysis Recheck;   Surgeon: Serafina Mitchell, MD;  Location: [facility];  Service: Cardiovascular;  Laterality: N/A;  . WISDOM TOOTH EXTRACTION      SOCIAL HISTORY: Social History   Socioeconomic History  . Marital status: Married    Spouse name: Not on file  . Number of children: 0  . Years of education: Not on file  . Highest education level: Not on file  Occupational History  . Occupation: Teacher, early years/pre  Social Needs  . Financial resource strain: Not on file  . Food insecurity:    Worry: Not on file    Inability: Not on file  . Transportation needs:    Medical: Not on file    Non-medical: Not on file  Tobacco Use  . Smoking status: Former Smoker    Packs/day: 1.00  Years: 4.00    Pack years: 4.00    Last attempt to quit: 11/18/1998    Years since quitting: 19.3  . Smokeless tobacco: Never Used  Substance and Sexual Activity  . Alcohol use: No    Alcohol/week: 0.0 oz  . Drug use: No  . Sexual activity: Yes    Partners: Male    Birth control/protection: IUD    Comment: Mirena inserted 01/2016  Lifestyle  . Physical activity:    Days per week: Not on file    Minutes per session: Not on file  . Stress: Not on file  Relationships  . Social connections:    Talks on phone: Not on file    Gets together: Not on file    Attends religious service: Not on file    Active member of club or organization: Not on file    Attends meetings of clubs or organizations: Not on file    Relationship status: Not on file  . Intimate partner violence:    Fear of current or ex partner: Not on file    Emotionally abused: Not on file    Physically abused: Not on file    Forced sexual activity: Not on file  Other Topics Concern  . Not on file  Social History Narrative   The patient is divorced.     Re-married Marcello Moores, partner of 10 years, on 10/10/16. 1 cats, 2 dogs, 1 cat   No children - doesn't want any   [place].   Moved from [place] to [place] in 2013.    Past smoker   No alcohol   1 caffeinated beverages a day   She reports she is compliant with sunscreen given her increased risk of sun damage and skin cancer on azathioprine      Updated 12/15/17    FAMILY HISTORY: Family History  Problem Relation Age of Onset  . Hypertension Mother   . Breast cancer Mother 9  . Bone cancer Mother        metastatic from breast  . Depression Mother   . Hypothyroidism Mother   . Colon polyps Father   . Diabetes Father        borderline  . Hypertension Father   . Hyperlipidemia Father   . Colon cancer Paternal Grandfather   . Multiple sclerosis Sister   . Alcohol abuse Sister   . Fuch's dystrophy Sister   . Stroke Maternal Grandmother   . Lung cancer Maternal Grandmother   . Hypertension Maternal Grandmother   . Heart disease Paternal Grandmother   . Hypertension Paternal Grandmother     ALLERGIES:  is allergic to ibuprofen; morphine and related; prednisone; and adhesive [tape].  MEDICATIONS:  Current Outpatient Medications  Medication Sig Dispense Refill  . acetaminophen (TYLENOL) 500 MG tablet Take 1,000 mg by mouth every 6 (six) hours as needed for headache (pain). Reported on 12/15/2015    . azaTHIOprine (IMURAN) 50 MG tablet Take 4 tablets by mouth every day 120 tablet 2  . buPROPion (WELLBUTRIN SR) 100 MG 12 hr tablet TAKE 1 TABLET (100 MG) BY MOUTH TWICE A DAY 180 tablet 0  . Calcium-Vitamin D-Vitamin K 767-341-93 MG-UNT-MCG CHEW Chew 2 each by mouth daily.    . cetirizine (ZYRTEC) 10 MG tablet Take 10 mg by mouth daily.    . cyanocobalamin (,VITAMIN B-12,) 1000 MCG/ML injection INJECT AS DIRECTED EVERY 30 DAYS 3 mL 4  . diphenhydrAMINE (SOMINEX) 25 MG tablet Take 25 mg by  mouth at bedtime.     Marland Kitchen ELIQUIS 5 MG TABS tablet TAKE 1 TABLET(5 MG) BY MOUTH TWICE DAILY 60 tablet 0  . escitalopram (LEXAPRO) 10 MG tablet TAKE 1 TABLET (10 MG TOTAL) BY MOUTH DAILY. 90 tablet 0  . fenofibrate 160 MG tablet Take 1 tablet (160 mg total) by mouth  daily. 30 tablet 11  . ferrous sulfate 325 (65 FE) MG tablet Take 1-2 tablets daily with food - at least 1x/day (Patient taking differently: Take 325 mg by mouth 2 (two) times daily with a meal. )  3  . levonorgestrel (MIRENA) 20 MCG/24HR IUD 1 each by Intrauterine route once. Implanted April 2017    . Omega-3 Fatty Acids (FISH OIL) 1200 MG CAPS Take 1,200 mg by mouth 2 (two) times daily.    . vedolizumab (ENTYVIO) 300 MG injection Inject 300 mg into the vein every 8 (eight) weeks. 1 vial 6   No current facility-administered medications for this visit.     REVIEW OF SYSTEMS:   .10 Point review of Systems was done is negative except as noted above.   PHYSICAL EXAMINATION: ECOG PERFORMANCE STATUS: 1 - Symptomatic but completely ambulatory  Vitals:   03/14/18 1447  BP: 121/82  Pulse: 93  Resp: 17  Temp: 98.4 F (36.9 C)  SpO2: 98%   Filed Weights   03/14/18 1447  Weight: 224 lb 12.8 oz (102 kg)   .Body mass index is 38.59 kg/m. Marland Kitchen GENERAL:alert, in no acute distress and comfortable SKIN: no acute rashes, no significant lesions EYES: conjunctiva are pink and non-injected, sclera anicteric OROPHARYNX: MMM, no exudates, no oropharyngeal erythema or ulceration NECK: supple, no JVD LYMPH:  no palpable lymphadenopathy in the cervical, axillary or inguinal regions LUNGS: clear to auscultation b/l with normal respiratory effort HEART: regular rate & rhythm ABDOMEN:  normoactive bowel sounds , non tender, not distended. Extremity: no pedal edema PSYCH: alert & oriented x 3 with fluent speech NEURO: no focal motor/sensory deficits   LABORATORY DATA:  I have reviewed the data as listed  . CBC Latest Ref Rng & Units 03/14/2018 01/09/2018 12/13/2017  WBC 3.9 - 10.3 K/uL 7.7 7.1 8.1  Hemoglobin 11.6 - 15.9 g/dL 12.4 11.8 10.8(L)  Hematocrit 34.8 - 46.6 % 37.5 37.1 35.4  Platelets 145 - 400 K/uL 470(H) 427(H) 837(HH)   . CBC    Component Value Date/Time   WBC 7.7 03/14/2018 1435    RBC 4.12 03/14/2018 1435   HGB 12.4 03/14/2018 1435   HGB 11.8 01/09/2018 1312   HGB 10.8 (L) 12/13/2017 0942   HCT 37.5 03/14/2018 1435   HCT 35.4 12/13/2017 0942   PLT 470 (H) 03/14/2018 1435   PLT 427 (H) 01/09/2018 1312   PLT 837 (HH) 12/13/2017 0942   MCV 91.0 03/14/2018 1435   MCV 94 12/13/2017 0942   MCH 30.2 03/14/2018 1435   MCHC 33.2 03/14/2018 1435   RDW 15.1 (H) 03/14/2018 1435   RDW 16.4 (H) 12/13/2017 0942   LYMPHSABS 1.1 03/14/2018 1435   LYMPHSABS 1.3 12/13/2017 0942   MONOABS 0.8 03/14/2018 1435   EOSABS 0.1 03/14/2018 1435   EOSABS 0.2 12/13/2017 0942   BASOSABS 0.0 03/14/2018 1435   BASOSABS 0.0 12/13/2017 0942     . CMP Latest Ref Rng & Units 03/14/2018 01/09/2018 12/13/2017  Glucose 70 - 140 mg/dL 91 94 87  BUN 7 - 26 mg/dL 12 9 7   Creatinine 0.60 - 1.10 mg/dL 0.92 0.83 0.87  Sodium 136 - 145  mmol/L 139 140 142  Potassium 3.5 - 5.1 mmol/L 4.2 4.0 4.4  Chloride 98 - 109 mmol/L 109 108 105  CO2 22 - 29 mmol/L 25 24 21   Calcium 8.4 - 10.4 mg/dL 9.0 9.1 9.4  Total Protein 6.4 - 8.3 g/dL 7.1 7.1 7.2  Total Bilirubin 0.2 - 1.2 mg/dL 0.3 0.3 0.3  Alkaline Phos 40 - 150 U/L 40 38(L) 54  AST 5 - 34 U/L 17 17 21   ALT 0 - 55 U/L 12 10 15    Component     Latest Ref Rng & Units 01/09/2018  PTT Lupus Anticoagulant     0.0 - 51.9 sec 34.3  DRVVT     0.0 - 47.0 sec 53.9 (H)  Lupus Anticoag Interp      Comment:  Beta-2 Glycoprotein I Ab, IgG     0 - 20 GPI IgG units 9  Beta-2-Glycoprotein I IgM     0 - 32 GPI IgM units <9  Beta-2-Glycoprotein I IgA     0 - 25 GPI IgA units <9  Anticardiolipin Ab,IgG,Qn     0 - 14 GPL U/mL <9  Anticardiolipin Ab,IgM,Qn     0 - 12 MPL U/mL 13 (H)  Anticardiolipin Ab,IgA,Qn     0 - 11 APL U/mL <9    Lupus anticoagulant panel      The value has a corrected status.      No reference range information available      Resulting Lab: [facility]      Comments:           (NOTE)           No lupus  anticoagulant was detected  RADIOGRAPHIC STUDIES: I have personally reviewed the radiological images as listed and agreed with the findings in the report. No results found.  ASSESSMENT & PLAN:   Lindsay Mccarthy is a 42 y.o. caucasian female with   1. Acute DVT and Submassive PE No definitive provocation of DVT. PE likely from dislodged DVT after mechanical thrombectomy and balloon venoplasty Has IVC filter placed.  On Eliquis since 12/2017 Non smoker. No recent use of hormonal contracept. Prothrombin gene mutation/Factor V leiden mutation negative, Anti-phoshopholipid ab negative. Jak2 testing results negative. Lupus negative. Based on results, elevated platelets likely had a reactive cause due to inflammation from Crohn's and elevated iron.   Body mass index is 38.59 kg/m. - obesity could an additional contributing risk factor. 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  2. Elevated platelets  Have been elevated 300-800K for over a year  -likely reactive to iron deficiency anemia and inflammation from Crohn's disease Low likelihood of ET/MPN but would rule this out given significant  Thrombotic event.  3. Iron Deficiency Anemia . Lab Results  Component Value Date   IRON 63 03/14/2018   TIBC 534 (H) 03/14/2018   IRONPCTSAT 12 (L) 03/14/2018   (Iron and TIBC)  Lab Results  Component Value Date   FERRITIN 6 (L) 03/14/2018  -on ferrous sulfate BID. Tolerating well -continue PO ironto maintain ferritin levels >50. -if still low in 3 months would consider IV iron replacement.  4. Vitamin B12 deficiency  -seen at 0258 on 01/09/18 labs  -Currently on monthly B12 injections with PCP, will continue    5. Crohn's Disease -Mx by Dr. Carlean Purl -On Imuran and Entyvio   PLAN:  -Labs  reviewed with pt in great detail, Her Hg remains normal at 12.4, PLT at 470K.  -I reviewed her  hypercoag w/u and all results were negative. There is no provocation for her clot outside of possibly her Crohn's.  -Given how extensive her blood clots were and her Crohn's disease I recommend she continue full dose blood thinners long term.  -In 6 months her IVC filter can be removed if her clots are completely resolved.  -Continue monthly vitamin B12 injections with PCP.  -Continue oral ferrous sulfate BID to target ferritin levels>50 -I discussed if she needs a surgery she would need a transition plan before and after surgery or invasive procedure.  -I answered all her questions to her understanding and satisfaction about how to proceed with treatment.  -Continue on same dose Eliquis for now and proceed to be managed by her PCP for ongoing long term anticoagulation. -I will follow up with her as needed in the future.    No orders of the defined types were placed in this encounter.  All of the patients questions were answered with apparent satisfaction. The patient knows to call the clinic with any problems, questions or concerns.  . The total time spent in the appointment was 25 minutes and more than 50% was on counseling and direct patient cares.      Sullivan Lone MD [place] AAHIVMS [facility] Hematology/Oncology Physician [facility]  (Office):       [phone] (Work cell):  [phone] (Fax):           [phone]  03/14/2018 3:05 PM  This document serves as a record of services personally performed by Sullivan Lone, MD. It was created on his behalf by Joslyn Devon, a trained medical scribe. The creation of this record is based on the scribe's personal observations and the provider's statements to them.    .I have reviewed the above documentation for accuracy and completeness, and I agree with the above.   Brunetta Genera MD MS

## 2018-03-14 ENCOUNTER — Encounter: Payer: Self-pay | Admitting: Hematology

## 2018-03-14 ENCOUNTER — Inpatient Hospital Stay: Payer: 59

## 2018-03-14 ENCOUNTER — Inpatient Hospital Stay: Payer: 59 | Attending: Hematology | Admitting: Hematology

## 2018-03-14 ENCOUNTER — Other Ambulatory Visit: Payer: Self-pay | Admitting: Hematology

## 2018-03-14 VITALS — BP 121/82 | HR 93 | Temp 98.4°F | Resp 17 | Ht 64.0 in | Wt 224.8 lb

## 2018-03-14 DIAGNOSIS — D6859 Other primary thrombophilia: Secondary | ICD-10-CM | POA: Insufficient documentation

## 2018-03-14 DIAGNOSIS — D509 Iron deficiency anemia, unspecified: Secondary | ICD-10-CM

## 2018-03-14 DIAGNOSIS — D500 Iron deficiency anemia secondary to blood loss (chronic): Secondary | ICD-10-CM

## 2018-03-14 DIAGNOSIS — D473 Essential (hemorrhagic) thrombocythemia: Secondary | ICD-10-CM

## 2018-03-14 DIAGNOSIS — Z87891 Personal history of nicotine dependence: Secondary | ICD-10-CM | POA: Insufficient documentation

## 2018-03-14 DIAGNOSIS — I82411 Acute embolism and thrombosis of right femoral vein: Secondary | ICD-10-CM | POA: Diagnosis not present

## 2018-03-14 DIAGNOSIS — I2782 Chronic pulmonary embolism: Secondary | ICD-10-CM

## 2018-03-14 DIAGNOSIS — Z79899 Other long term (current) drug therapy: Secondary | ICD-10-CM | POA: Insufficient documentation

## 2018-03-14 DIAGNOSIS — E538 Deficiency of other specified B group vitamins: Secondary | ICD-10-CM

## 2018-03-14 DIAGNOSIS — D75839 Thrombocytosis, unspecified: Secondary | ICD-10-CM

## 2018-03-14 LAB — CBC WITH DIFFERENTIAL/PLATELET
BASOS ABS: 0 10*3/uL (ref 0.0–0.1)
BASOS PCT: 0 %
EOS ABS: 0.1 10*3/uL (ref 0.0–0.5)
EOS PCT: 1 %
HCT: 37.5 % (ref 34.8–46.6)
Hemoglobin: 12.4 g/dL (ref 11.6–15.9)
Lymphocytes Relative: 14 %
Lymphs Abs: 1.1 10*3/uL (ref 0.9–3.3)
MCH: 30.2 pg (ref 25.1–34.0)
MCHC: 33.2 g/dL (ref 31.5–36.0)
MCV: 91 fL (ref 79.5–101.0)
MONO ABS: 0.8 10*3/uL (ref 0.1–0.9)
Monocytes Relative: 11 %
Neutro Abs: 5.7 10*3/uL (ref 1.5–6.5)
Neutrophils Relative %: 74 %
PLATELETS: 470 10*3/uL — AB (ref 145–400)
RBC: 4.12 MIL/uL (ref 3.70–5.45)
RDW: 15.1 % — AB (ref 11.2–14.5)
WBC: 7.7 10*3/uL (ref 3.9–10.3)

## 2018-03-14 LAB — CMP (CANCER CENTER ONLY)
ALBUMIN: 3.6 g/dL (ref 3.5–5.0)
ALT: 12 U/L (ref 0–55)
AST: 17 U/L (ref 5–34)
Alkaline Phosphatase: 40 U/L (ref 40–150)
Anion gap: 5 (ref 3–11)
BUN: 12 mg/dL (ref 7–26)
CHLORIDE: 109 mmol/L (ref 98–109)
CO2: 25 mmol/L (ref 22–29)
Calcium: 9 mg/dL (ref 8.4–10.4)
Creatinine: 0.92 mg/dL (ref 0.60–1.10)
GFR, Est AFR Am: >60 mL/min (ref >60)
GFR, Estimated: >60 mL/min (ref >60)
GLUCOSE: 91 mg/dL (ref 70–140)
POTASSIUM: 4.2 mmol/L (ref 3.5–5.1)
Sodium: 139 mmol/L (ref 136–145)
Total Bilirubin: 0.3 mg/dL (ref 0.2–1.2)
Total Protein: 7.1 g/dL (ref 6.4–8.3)

## 2018-03-14 LAB — FERRITIN: FERRITIN: 6 ng/mL — AB (ref 9–269)

## 2018-03-14 LAB — IRON AND TIBC
Iron: 63 ug/dL (ref 41–142)
Saturation Ratios: 12 % — ABNORMAL LOW (ref 21–57)
TIBC: 534 ug/dL — AB (ref 236–444)
UIBC: 471 ug/dL

## 2018-03-14 NOTE — Telephone Encounter (Signed)
Refill x 1 year

## 2018-03-14 NOTE — Telephone Encounter (Signed)
B12 level in March was 143. Please advise how many refills Sir, thank you.

## 2018-03-15 ENCOUNTER — Telehealth: Payer: Self-pay

## 2018-03-15 NOTE — Telephone Encounter (Signed)
RTC with Dr Irene Limbo as needed. Per 5/14 los

## 2018-04-04 ENCOUNTER — Encounter (INDEPENDENT_AMBULATORY_CARE_PROVIDER_SITE_OTHER): Payer: Self-pay

## 2018-04-06 ENCOUNTER — Other Ambulatory Visit: Payer: Self-pay | Admitting: Family Medicine

## 2018-04-06 NOTE — Telephone Encounter (Signed)
[facility] is requesting to fill pt eliquis. Please advise [facility]

## 2018-04-10 ENCOUNTER — Encounter: Payer: Self-pay | Admitting: Family Medicine

## 2018-04-10 DIAGNOSIS — F325 Major depressive disorder, single episode, in full remission: Secondary | ICD-10-CM

## 2018-04-10 MED ORDER — ESCITALOPRAM OXALATE 10 MG PO TABS: 10.0000 mg | ORAL_TABLET | Freq: Every day | ORAL | 1 refills | Status: DC

## 2018-04-12 ENCOUNTER — Ambulatory Visit (INDEPENDENT_AMBULATORY_CARE_PROVIDER_SITE_OTHER): Payer: 59 | Admitting: Internal Medicine

## 2018-04-12 ENCOUNTER — Encounter

## 2018-04-12 ENCOUNTER — Encounter: Payer: Self-pay | Admitting: Internal Medicine

## 2018-04-12 VITALS — BP 110/74 | HR 80 | Ht 64.0 in | Wt 222.1 lb

## 2018-04-12 DIAGNOSIS — Z7960 Long term (current) use of unspecified immunomodulators and immunosuppressants: Secondary | ICD-10-CM

## 2018-04-12 DIAGNOSIS — Z79899 Other long term (current) drug therapy: Secondary | ICD-10-CM | POA: Diagnosis not present

## 2018-04-12 DIAGNOSIS — D500 Iron deficiency anemia secondary to blood loss (chronic): Secondary | ICD-10-CM | POA: Diagnosis not present

## 2018-04-12 DIAGNOSIS — K9089 Other intestinal malabsorption: Secondary | ICD-10-CM | POA: Diagnosis not present

## 2018-04-12 DIAGNOSIS — Z23 Encounter for immunization: Secondary | ICD-10-CM

## 2018-04-12 DIAGNOSIS — K50018 Crohn's disease of small intestine with other complication: Secondary | ICD-10-CM

## 2018-04-12 MED ORDER — COLESTIPOL HCL 5 G PO GRAN: 5.0000 g | GRANULES | Freq: Every day | ORAL | 5 refills | Status: DC

## 2018-04-12 NOTE — Patient Instructions (Addendum)
  We are going to obtain your labs from [facility] for review.   We have sent the following medications to your pharmacy for you to pick up at your convenience: Colestipol, let us know if this constipates you.   Follow up with Dr Carlean Purl in August.   Today we are giving you a Prevnar vaccine.    I appreciate the opportunity to care for you. Silvano Rusk, MD, [facility]

## 2018-04-12 NOTE — Assessment & Plan Note (Addendum)
On Entyvio and AZA ? [facility] RTC Aug

## 2018-04-12 NOTE — Progress Notes (Signed)
Lindsay Mccarthy 42 y.o. 27-Jan-1976 [id number]  Assessment & Plan:   Encounter Diagnoses  Name Primary?  . Crohn's disease of small intestine with other complication ([place]) Yes  . Long-term use of immunosuppressant medication-Azathioprine and Entyvio   . 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 Iron deficiency anemia due to chronic blood loss    Crohn's disease of small intestine (HCC) On Entyvio and AZA ? Bile [employer] RTC Aug  Long-term use of immunosuppressant medication-Azathioprine and Lindsay Mccarthy today She sees derm annually GYN annually PAP /HPV ok 3/19  Iron deficiency anemia due to chronic blood loss fe so4     Colestipol to see if bile salt malabsorption causing diarrhea - in past did not realize she was stooling so much roi labs done in 2018-19 at [facility] (? [facility]) Starting home infusion Could possibly stop tricor Prevnar vaccine today  Subjective:   Chief Complaint: f/u UC  HPI Had DVT and PE admission a few months ago - no clear cause though Crohn's likely contributor especially since had been flaring. Tolerating Lindsay Mccarthy has up to 15 stools a day sometimes but says that was the case prior when she thought she was in remission befroe. Does remember being on a powder (? Cholestyramine) after her small bowel resection years ago  Mom is in hospice with breast cancer and Lindsay Mccarthy has been going back to [place] some - sig stress   Allergies  Allergen Reactions  . Ibuprofen Other (See Comments)    Avoids due to Crohns disease  . Morphine And Related Itching    itchy  . Prednisone Other (See Comments)    "crazy", hallucinations  . Adhesive [Tape] Rash    Rash with electrodes   Current Meds  Medication Sig  . acetaminophen (TYLENOL) 500 MG tablet Take 1,000 mg by mouth every 6 (six) hours as needed for headache (pain). Reported on 12/15/2015  . azaTHIOprine (IMURAN) 50 MG tablet Take 4 tablets by mouth every day  .  buPROPion (WELLBUTRIN SR) 100 MG 12 hr tablet TAKE 1 TABLET (100 MG) BY MOUTH TWICE A DAY  . Calcium-Vitamin D-Vitamin K [id number] MG-UNT-MCG CHEW Chew 2 each by mouth daily.  . cetirizine (ZYRTEC) 10 MG tablet Take 10 mg by mouth daily.  . cyanocobalamin (,VITAMIN B-12,) 1000 MCG/ML injection INJECT AS DIRECTED EVERY 30 DAYS  . diphenhydrAMINE (SOMINEX) 25 MG tablet Take 25 mg by mouth at bedtime.   Marland Kitchen ELIQUIS 5 MG TABS tablet TAKE 1 TABLET(5 MG) BY MOUTH TWICE DAILY  . escitalopram (LEXAPRO) 10 MG tablet Take 1 tablet (10 mg total) by mouth daily.  . fenofibrate 160 MG tablet Take 1 tablet (160 mg total) by mouth daily.  . ferrous sulfate 325 (65 FE) MG tablet Take 1-2 tablets daily with food - at least 1x/day (Patient taking differently: Take 325 mg by mouth 2 (two) times daily with a meal. )  . levonorgestrel (MIRENA) 20 MCG/24HR IUD 1 each by Intrauterine route once. Implanted April 2017  . Omega-3 Fatty Acids (FISH OIL) 1200 MG CAPS Take 1,200 mg by mouth 2 (two) times daily.  . vedolizumab (ENTYVIO) 300 MG injection Inject 300 mg into the vein every 8 (eight) weeks.   Past Medical History:  Diagnosis Date  . Anemia    related to Crohns flares  . Anxiety   . Arthritis    knees, feet, hands, wrists, related to Crohns flare  . Asthma    Childhood  . B12 deficiency  monitored/treated by Dr. Carlean Mccarthy  . Cervical dysplasia   . Crohn's disease of small intestine ([place]) 06/24/2012   Diagnosed 1999, in [place]. Ileitis only then. Treated with Imuran Remicade and prednisone.  Noncompliant with therapy. 2004 return to care and was treated with Remicade prednisone Pentasa Cipro and Flagyl. 2006 status post right hemicolectomy. Subsequently treated with azathioprine and Cimzia. 200 mg every other week.  azathioprine was added in 2010. Prometheus TP MT enzyme was negative. Has ha  . Depression   . DVT of upper extremity (deep vein thrombosis) ([place]) 11/28/2017  . Eczema    arms and behind  knees, worse in winter  . History of recurrent UTI (urinary tract infection)   . HPV (human papilloma virus) infection   . Hyperlipemia   . Osteopenia of femur neck T. score -1.4 09/29/2011   09/2013 T-1.6  . Papanicolaou smear 12/12   last abnormal 2011  . Pulmonary embolism ([place]) 11/30/2017  . Scaphoid fracture of wrist 2013   left  . Seasonal allergic rhinitis   . Shingles 09/2015   R hip  . Wears glasses    Past Surgical History:  Procedure Laterality Date  . APPENDECTOMY    . CERVICAL BIOPSY  W/ LOOP ELECTRODE EXCISION  2009   ---paps normal since  . COLONOSCOPY  multiple   scanned  . ESOPHAGOGASTRODUODENOSCOPY  multiple   scanned  . HEMICOLECTOMY  2006  . IR ANGIOGRAM PULMONARY BILATERAL SELECTIVE  12/03/2017  . IR ANGIOGRAM SELECTIVE EACH ADDITIONAL VESSEL  12/03/2017  . IR ANGIOGRAM SELECTIVE EACH ADDITIONAL VESSEL  12/03/2017  . IR INFUSION THROMBOL ARTERIAL INITIAL (MS)  12/03/2017  . IR INFUSION THROMBOL ARTERIAL INITIAL (MS)  12/03/2017  . IR IVC FILTER PLMT / S&I /IMG GUID/MOD SED  12/04/2017  . IR THROMB F/U EVAL ART/VEN FINAL DAY (MS)  12/04/2017  . IR US GUIDE VASC ACCESS RIGHT  12/03/2017  . LEEP  2011   --done in [place]  . PERIPHERAL VASCULAR BALLOON ANGIOPLASTY Right 11/30/2017   Procedure: PERIPHERAL VASCULAR BALLOON ANGIOPLASTY;  Surgeon: Serafina Mitchell, MD;  Location: [facility];  Service: Cardiovascular;  Laterality: Right;  . PERIPHERAL VASCULAR THROMBECTOMY N/A 11/29/2017   Procedure: PERIPHERAL VASCULAR THROMBECTOMY - THROMBOLYSIS;  Surgeon: Serafina Mitchell, MD;  Location: [facility];  Service: Cardiovascular;  Laterality: N/A;  LYSIS CATHETER PLACEMENT ONLY  . PERIPHERAL VASCULAR THROMBECTOMY N/A 11/30/2017   Procedure: PERIPHERAL VASCULAR THROMBECTOMY - Lysis Recheck;  Surgeon: Serafina Mitchell, MD;  Location: [facility];  Service: Cardiovascular;  Laterality: N/A;  . WISDOM TOOTH EXTRACTION     Social History   Social History  Narrative   The patient is divorced.     Re-married Lindsay Mccarthy, partner of 10 years, on 10/10/16. 1 cats, 2 dogs, 1 cat   No children - doesn't want any   [place].   Moved from [place] to [place] in 2013.   Past smoker   No alcohol   1 caffeinated beverages a day   She reports she is compliant with sunscreen given her increased risk of sun damage and skin cancer on azathioprine      Updated 12/15/17   family history includes Alcohol abuse in her sister; Bone cancer in her mother; Breast cancer (age of onset: 82) in her mother; Colon cancer in her paternal grandfather; Colon polyps in her father; Depression in her mother; Diabetes in her father; Fuch's dystrophy in her sister; Heart disease in  her paternal grandmother; Hyperlipidemia in her father; Hypertension in her father, maternal grandmother, mother, and paternal grandmother; Hypothyroidism in her mother; Lung cancer in her maternal grandmother; Multiple sclerosis in her sister; Stroke in her maternal grandmother.   Review of Systems   Objective:   Physical Exam @BP  110/74 (BP Location: Left Arm, Patient Position: Sitting, Cuff Size: Normal)   Pulse 80   Ht 5' 4"  (1.626 m)   Wt 222 lb 2 oz (100.8 kg)   BMI 38.13 kg/m @  General:  NAD Eyes:   anicteric Lungs:  clear Heart::  S1S2 no rubs, murmurs or gallops Abdomen:  soft and nontender, BS+ Ext:   no edema, cyanosis or clubbing    Data Reviewed:  Hospital notes PCP notes Labs in EMR from 2019

## 2018-04-12 NOTE — Assessment & Plan Note (Addendum)
Prevnar today She sees derm annually GYN annually PAP /HPV ok 3/19

## 2018-04-12 NOTE — Assessment & Plan Note (Signed)
fe so4

## 2018-05-01 ENCOUNTER — Other Ambulatory Visit: Payer: Self-pay | Admitting: Family Medicine

## 2018-05-08 ENCOUNTER — Other Ambulatory Visit: Payer: Self-pay | Admitting: Internal Medicine

## 2018-05-24 ENCOUNTER — Other Ambulatory Visit: Payer: Self-pay

## 2018-05-24 DIAGNOSIS — I82401 Acute embolism and thrombosis of unspecified deep veins of right lower extremity: Secondary | ICD-10-CM

## 2018-05-24 DIAGNOSIS — M79662 Pain in left lower leg: Secondary | ICD-10-CM

## 2018-05-24 DIAGNOSIS — I82411 Acute embolism and thrombosis of right femoral vein: Secondary | ICD-10-CM

## 2018-06-05 ENCOUNTER — Other Ambulatory Visit: Payer: Self-pay | Admitting: Family Medicine

## 2018-06-05 ENCOUNTER — Encounter: Payer: Self-pay | Admitting: Family Medicine

## 2018-06-05 NOTE — Telephone Encounter (Signed)
Is this ok to refill?  

## 2018-06-13 NOTE — Progress Notes (Signed)
NEUROLOGY CONSULTATION NOTE  Lindsay Mccarthy MRN: [id number] DOB: [birth year]  Referring provider: Rita Ohara, MD Primary care provider: Rita Ohara, MD  Reason for consult:  Frequent falls, abnormal gait, family history of MS  HISTORY OF PRESENT ILLNESS: Lindsay Mccarthy is a 42 year old right-handed female with Crohn's disease, depression, and anxiety who presents for evaluation of MS.  She reports history of various neurologic symptoms since her late 6s: -  Urinary incontinence:  It started about a year ago and has gotten worse.  It occurs suddenly, once or twice a month, usually at home and while standing, such as while washing dishes with the water running.  It occurs during fatigued or emotional stress as well.  She denies increased urinary frequency or dysuria.  No perineal numbness.  No low back pain.   - She reports poor night vision with difficulty seeing while driving at night. She has trouble focusing.  Recent eye exam showed poor "refocusing", which would normally be seen in an older adult.  Her sister had Fuchs dystrophy but she doesn't have it. -  She reports numbness and tingling in her hands when she wakes up in the morning or in the evening after work. -  She reports abnormal gait.  It feels like her feet are stuck in cement.  She has trouble lifting her feet.  It causes hip pain.  She trips over her feet.  It is worse when there is an extreme change in temperature.  She started having trouble in her mid-30s.  Worse over the past 2 years.  She reports a stabbing pain in her hips or knees.  She reports previously being diagnosed with a pinched nerve in the back that caused a left foot drop in the 20s. She had PT and injections which was ineffective.  No numbness in the legs.   -  When she throws a ball, her hand doesn't release immediately.  There is a delay until she is able to release the ball.  She has similar problems with her left hand.  No neck pain. -  She reports  fatigued.  She needs to take a nap during the day.  She is concerned that these symptoms may be due to multiple sclerosis.  One of her two sisters has multiple sclerosis.  Other history:  She has Crohn's disease.  Earlier this year, she developed a DVT in her right leg.  She also was found to have B12 deficiency and is being treated with injections.  01/09/18 LABS:  B12 143, Sed rate 18.  Hypercoagulable panel unremarkable, including lupus anticoagulant panel, dRVVT mix, beta-2-glycoprotein, cardiolipin antibodies, prothrombin gene mutation, factor V Leiden.   PAST MEDICAL HISTORY: Past Medical History:  Diagnosis Date  . Anemia    related to Crohns flares  . Anxiety   . Arthritis    knees, feet, hands, wrists, related to Crohns flare  . Asthma    Childhood  . B12 deficiency    monitored/treated by Dr. Carlean Purl  . Cervical dysplasia   . Crohn's disease of small intestine ([place]) 06/24/2012   Diagnosed 1999, in [place]. Ileitis only then. Treated with Imuran Remicade and prednisone.  Noncompliant with therapy. 2004 return to care and was treated with Remicade prednisone Pentasa Cipro and Flagyl. 2006 status post right hemicolectomy. Subsequently treated with azathioprine and Cimzia. 200 mg every other week.  azathioprine was added in 2010. Prometheus TP MT enzyme was negative. Has ha  . Depression   .  DVT of upper extremity (deep vein thrombosis) ([place]) 11/28/2017  . Eczema    arms and behind knees, worse in winter  . History of recurrent UTI (urinary tract infection)   . HPV (human papilloma virus) infection   . Hyperlipemia   . Osteopenia of femur neck T. score -1.4 09/29/2011   09/2013 T-1.6  . Papanicolaou smear 12/12   last abnormal 2011  . Pulmonary embolism ([place]) 11/30/2017  . Scaphoid fracture of wrist 2013   left  . Seasonal allergic rhinitis   . Shingles 09/2015   R hip  . Wears glasses     PAST SURGICAL HISTORY: Past Surgical History:  Procedure Laterality Date  .  APPENDECTOMY    . CERVICAL BIOPSY  W/ LOOP ELECTRODE EXCISION  2009   ---paps normal since  . COLONOSCOPY  multiple   scanned  . ESOPHAGOGASTRODUODENOSCOPY  multiple   scanned  . HEMICOLECTOMY  2006  . IR ANGIOGRAM PULMONARY BILATERAL SELECTIVE  12/03/2017  . IR ANGIOGRAM SELECTIVE EACH ADDITIONAL VESSEL  12/03/2017  . IR ANGIOGRAM SELECTIVE EACH ADDITIONAL VESSEL  12/03/2017  . IR INFUSION THROMBOL ARTERIAL INITIAL (MS)  12/03/2017  . IR INFUSION THROMBOL ARTERIAL INITIAL (MS)  12/03/2017  . IR IVC FILTER PLMT / S&I /IMG GUID/MOD SED  12/04/2017  . IR THROMB F/U EVAL ART/VEN FINAL DAY (MS)  12/04/2017  . IR US GUIDE VASC ACCESS RIGHT  12/03/2017  . LEEP  2011   --done in [place]  . PERIPHERAL VASCULAR BALLOON ANGIOPLASTY Right 11/30/2017   Procedure: PERIPHERAL VASCULAR BALLOON ANGIOPLASTY;  Surgeon: Serafina Mitchell, MD;  Location: [facility];  Service: Cardiovascular;  Laterality: Right;  . PERIPHERAL VASCULAR THROMBECTOMY N/A 11/29/2017   Procedure: PERIPHERAL VASCULAR THROMBECTOMY - THROMBOLYSIS;  Surgeon: Serafina Mitchell, MD;  Location: [facility];  Service: Cardiovascular;  Laterality: N/A;  LYSIS CATHETER PLACEMENT ONLY  . PERIPHERAL VASCULAR THROMBECTOMY N/A 11/30/2017   Procedure: PERIPHERAL VASCULAR THROMBECTOMY - Lysis Recheck;  Surgeon: Serafina Mitchell, MD;  Location: [facility];  Service: Cardiovascular;  Laterality: N/A;  . WISDOM TOOTH EXTRACTION      MEDICATIONS: Current Outpatient Medications on File Prior to Visit  Medication Sig Dispense Refill  . acetaminophen (TYLENOL) 500 MG tablet Take 1,000 mg by mouth every 6 (six) hours as needed for headache (pain). Reported on 12/15/2015    . azaTHIOprine (IMURAN) 50 MG tablet TAKE 4 TABLETS BY MOUTH EVERY DAY 360 tablet 0  . buPROPion (WELLBUTRIN SR) 100 MG 12 hr tablet TAKE 1 TABLET (100 MG) BY MOUTH TWICE A DAY 180 tablet 0  . Calcium-Vitamin D-Vitamin K [id number] MG-UNT-MCG CHEW Chew 2 each by mouth daily.      . cetirizine (ZYRTEC) 10 MG tablet Take 10 mg by mouth daily.    . colestipol (COLESTID) 5 g granules Take 5 g by mouth daily. 300 g 5  . cyanocobalamin (,VITAMIN B-12,) 1000 MCG/ML injection INJECT AS DIRECTED EVERY 30 DAYS 3 mL 4  . diphenhydrAMINE (SOMINEX) 25 MG tablet Take 25 mg by mouth at bedtime.     Marland Kitchen ELIQUIS 5 MG TABS tablet TAKE 1 TABLET(5 MG) BY MOUTH TWICE DAILY 60 tablet 5  . escitalopram (LEXAPRO) 10 MG tablet Take 1 tablet (10 mg total) by mouth daily. 90 tablet 1  . fenofibrate 160 MG tablet Take 1 tablet (160 mg total) by mouth daily. 30 tablet 11  . ferrous sulfate 325 (65 FE) MG tablet Take 1-2 tablets daily with  food - at least 1x/day (Patient taking differently: Take 325 mg by mouth 2 (two) times daily with a meal. )  3  . levonorgestrel (MIRENA) 20 MCG/24HR IUD 1 each by Intrauterine route once. Implanted April 2017    . Omega-3 Fatty Acids (FISH OIL) 1200 MG CAPS Take 1,200 mg by mouth 2 (two) times daily.    . vedolizumab (ENTYVIO) 300 MG injection Inject 300 mg into the vein every 8 (eight) weeks. 1 vial 6   No current facility-administered medications on file prior to visit.     ALLERGIES: Allergies  Allergen Reactions  . Ibuprofen Other (See Comments)    Avoids due to Crohns disease  . Morphine And Related Itching    itchy  . Prednisone Other (See Comments)    "crazy", hallucinations  . Adhesive [Tape] Rash    Rash with electrodes    FAMILY HISTORY: Family History  Problem Relation Age of Onset  . Hypertension Mother   . Breast cancer Mother 82  . Bone cancer Mother        metastatic from breast  . Depression Mother   . Hypothyroidism Mother   . Colon polyps Father   . Diabetes Father        borderline  . Hypertension Father   . Hyperlipidemia Father   . Colon cancer Paternal Grandfather   . Multiple sclerosis Sister   . Alcohol abuse Sister   . Fuch's dystrophy Sister   . Stroke Maternal Grandmother   . Lung cancer Maternal Grandmother    . Hypertension Maternal Grandmother   . Heart disease Paternal Grandmother   . Hypertension Paternal Grandmother     SOCIAL HISTORY: Social History   Socioeconomic History  . Marital status: Married    Spouse name: Not on file  . Number of children: 0  . Years of education: Not on file  . Highest education level: Not on file  Occupational History  . Occupation: Teacher, early years/pre  Social Needs  . Financial resource strain: Not on file  . Food insecurity:    Worry: Not on file    Inability: Not on file  . Transportation needs:    Medical: Not on file    Non-medical: Not on file  Tobacco Use  . Smoking status: Former Smoker    Packs/day: 1.00    Years: 4.00    Pack years: 4.00    Last attempt to quit: 11/18/1998    Years since quitting: 19.5  . Smokeless tobacco: Never Used  Substance and Sexual Activity  . Alcohol use: No    Alcohol/week: 0.0 standard drinks  . Drug use: No  . Sexual activity: Yes    Partners: Male    Birth control/protection: IUD    Comment: Mirena inserted 01/2016  Lifestyle  . Physical activity:    Days per week: Not on file    Minutes per session: Not on file  . Stress: Not on file  Relationships  . Social connections:    Talks on phone: Not on file    Gets together: Not on file    Attends religious service: Not on file    Active member of club or organization: Not on file    Attends meetings of clubs or organizations: Not on file    Relationship status: Not on file  . Intimate partner violence:    Fear of current or ex partner: Not on file    Emotionally abused: Not on file    Physically abused: Not  on file    Forced sexual activity: Not on file  Other Topics Concern  . Not on file  Social History Narrative   The patient is divorced.     Re-married Marcello Moores, partner of 10 years, on 10/10/16. 1 cats, 2 dogs, 1 cat   No children - doesn't want any   [place].   Moved from [place] to [place] in 2013.   Past  smoker   No alcohol   1 caffeinated beverages a day   She reports she is compliant with sunscreen given her increased risk of sun damage and skin cancer on azathioprine      Updated 12/15/17    REVIEW OF SYSTEMS: Constitutional: Fatigue;  No fevers, chills, or sweats, change in appetite Eyes: trouble focusing Ear, nose and throat: No hearing loss, ear pain, nasal congestion, sore throat Cardiovascular: No chest pain, palpitations Respiratory:  No shortness of breath at rest or with exertion, wheezes GastrointestinaI: Has Crohn's disease Genitourinary:  Intermittent urinary incontinence Musculoskeletal:  No neck pain, back pain Integumentary: No rash, pruritus, skin lesions Neurological: as above Psychiatric: No depression, insomnia, anxiety Endocrine: No palpitations, fatigue, diaphoresis, mood swings, change in appetite, change in weight, increased thirst Hematologic/Lymphatic:  No purpura, petechiae. Allergic/Immunologic: no itchy/runny eyes, nasal congestion, recent allergic reactions, rashes  PHYSICAL EXAM: Blood pressure 116/72, pulse 87, height 5' 4"  (1.626 m), weight 230 lb (104.3 kg), SpO2 97 %. General: No acute distress.  Patient appears well-groomed.  Head:  Normocephalic/atraumatic Eyes:  fundi examined but not visualized Neck: supple, no paraspinal tenderness, full range of motion Back: No paraspinal tenderness Heart: regular rate and rhythm Lungs: Clear to auscultation bilaterally. Vascular: No carotid bruits. Neurological Exam: Mental status: alert and oriented to person, place, and time, recent and remote memory intact, fund of knowledge intact, attention and concentration intact, speech fluent and not dysarthric, language intact. Cranial nerves: CN I: not tested CN II: pupils equal, round and reactive to light, visual fields intact CN III, IV, VI:  full range of motion, no nystagmus, no ptosis CN V: facial sensation intact CN VII: upper and lower face  symmetric CN VIII: hearing intact CN IX, X: gag intact, uvula midline CN XI: sternocleidomastoid and trapezius muscles intact CN XII: tongue midline Bulk & Tone: increased tone in the lower extremities, no fasciculations. Motor:  5/5 throughout  Sensation:  Pinprick sensation reduced in right upper and lower extremities, and vibration sensation intact. Deep Tendon Reflexes:  2+ throughout, Bilateral Babinski. Finger to nose testing:  Without dysmetria.  Heel to shin:  Without dysmetria.  Gait:  Spastic gait.  Able to turn but not tandem walk. Romberg negative.  IMPRESSION: 42 year old female presenting with myelopathic/CNS symptoms, including spastic gait, bladder dysfunction, Babinski.  B12 deficiency also may present with these symptoms, however she has been adequately treated with injections.  She also reports visual disturbance and has a family history of MS.  Picture is concerning for emyelinating disease  PLAN: 1.  MRI of brain/cervical & thoracic spine with and without contrast 2.  Further recommendations pending results. 3.  Follow up after testing.  Thank you for allowing me to take part in the care of this patient.  Metta Clines, DO  CC:  Rita Ohara, MD

## 2018-06-15 ENCOUNTER — Encounter: Payer: Self-pay | Admitting: Neurology

## 2018-06-15 ENCOUNTER — Encounter

## 2018-06-15 ENCOUNTER — Ambulatory Visit (INDEPENDENT_AMBULATORY_CARE_PROVIDER_SITE_OTHER): Payer: 59 | Admitting: Neurology

## 2018-06-15 VITALS — BP 116/72 | HR 87 | Ht 64.0 in | Wt 230.0 lb

## 2018-06-15 DIAGNOSIS — R261 Paralytic gait: Secondary | ICD-10-CM

## 2018-06-15 DIAGNOSIS — R292 Abnormal reflex: Secondary | ICD-10-CM

## 2018-06-15 DIAGNOSIS — R32 Unspecified urinary incontinence: Secondary | ICD-10-CM

## 2018-06-15 DIAGNOSIS — Z820 Family history of epilepsy and other diseases of the nervous system: Secondary | ICD-10-CM | POA: Diagnosis not present

## 2018-06-15 DIAGNOSIS — H539 Unspecified visual disturbance: Secondary | ICD-10-CM

## 2018-06-15 NOTE — Patient Instructions (Addendum)
1.  We will first check MRI of brain/cervical & thoracic spine with and without contrast.  Further recommendations pending results. 2.  Follow up after testing.  We have sent a referral to [facility] for your MRI and they will call you directly to schedule your appt. They are located at [place]. If you need to contact them directly please call [phone].

## 2018-06-17 ENCOUNTER — Other Ambulatory Visit: Payer: Self-pay | Admitting: Family Medicine

## 2018-06-17 DIAGNOSIS — F325 Major depressive disorder, single episode, in full remission: Secondary | ICD-10-CM

## 2018-06-19 ENCOUNTER — Encounter: Payer: Self-pay | Admitting: *Deleted

## 2018-06-19 ENCOUNTER — Ambulatory Visit (INDEPENDENT_AMBULATORY_CARE_PROVIDER_SITE_OTHER): Payer: 59 | Admitting: Surgery

## 2018-06-19 ENCOUNTER — Other Ambulatory Visit: Payer: Self-pay

## 2018-06-19 ENCOUNTER — Encounter: Payer: Self-pay | Admitting: Surgery

## 2018-06-19 ENCOUNTER — Ambulatory Visit (HOSPITAL_COMMUNITY)
Admission: RE | Admit: 2018-06-19 | Discharge: 2018-06-19 | Disposition: A | Discharge status: Home / Self Care | Payer: 59 | Source: Ambulatory Visit | Attending: Vascular Surgery | Admitting: Vascular Surgery

## 2018-06-19 VITALS — BP 114/79 | HR 85 | Temp 98.4°F | Resp 16 | Ht 64.0 in | Wt 224.0 lb

## 2018-06-19 DIAGNOSIS — I872 Venous insufficiency (chronic) (peripheral): Secondary | ICD-10-CM | POA: Diagnosis not present

## 2018-06-19 DIAGNOSIS — M79662 Pain in left lower leg: Secondary | ICD-10-CM | POA: Insufficient documentation

## 2018-06-19 DIAGNOSIS — I82401 Acute embolism and thrombosis of unspecified deep veins of right lower extremity: Secondary | ICD-10-CM | POA: Diagnosis present

## 2018-06-19 DIAGNOSIS — I82411 Acute embolism and thrombosis of right femoral vein: Secondary | ICD-10-CM

## 2018-06-19 NOTE — H&P (View-Only) (Signed)
 [facility] [place]  Patient name: Lindsay Mccarthy MRN: [id number] DOB: 06/24/1976 Sex: female   REASON FOR VISIT:    Follow up DVT  HISOTRY OF PRESENT ILLNESS:   Lindsay Mccarthy is a 41 y.o. female who presented to the hospital on 11/27/2017 with a sudden onset of right leg swelling for 2 days.  She is also been having some pleuritic chest pain and shortness of breath.  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  The following day she returned and had AngioJet thrombectomy and balloon venoplasty.  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  Unfortunately, she began developing shortness of breath and was confirmed to have worsening PE by CT angiogram.  This required thrombolysis and IVC filter placement.  She was ultimately able to be discharged home.  She returns today for follow-up.    She does not routinely wear her compression stockings as she is not experiencing any leg swelling.    PAST MEDICAL HISTORY:   Past Medical History:  Diagnosis Date  . Anemia    related to Crohns flares  . Anxiety   . Arthritis    knees, feet, hands, wrists, related to Crohns flare  . Asthma    Childhood  . B12 deficiency    monitored/treated by Dr. Gessner  . Cervical dysplasia   . Crohn's disease of small intestine (HCC) 06/24/2012   Diagnosed 1999, in [place]. Ileitis only then. Treated with Imuran Remicade and prednisone.  Noncompliant with therapy. 2004 return to care and was treated with Remicade prednisone Pentasa Cipro and Flagyl. 2006 status post right hemicolectomy. Subsequently treated with azathioprine and Cimzia. 200 mg every other week.  azathioprine was added in 2010.  Prometheus TP MT enzyme was negative. Has ha  . Depression   . DVT of upper extremity (deep vein thrombosis) (HCC) 11/28/2017  . Eczema    arms and behind knees, worse in winter  . History of recurrent UTI (urinary tract infection)   . HPV (human papilloma virus) infection   . Hyperlipemia   . Osteopenia of femur neck T. score -1.4 09/29/2011   09/2013 T-1.6  . Papanicolaou smear 12/12   last abnormal 2011  . Pulmonary embolism (HCC) 11/30/2017  . Scaphoid fracture of wrist 2013   left  . Seasonal allergic rhinitis   . Shingles 09/2015   R hip  . Wears glasses      FAMILY HISTORY:   Family History  Problem Relation Age of Onset  . Hypertension Mother   . Breast cancer Mother 54  . Bone cancer Mother        metastatic from breast  . Depression Mother   . Hypothyroidism Mother   . Colon polyps Father   . Diabetes Father        borderline  . Hypertension Father   . Hyperlipidemia Father   . Colon cancer Paternal Grandfather   . Multiple sclerosis Sister   . Alcohol abuse Sister   . Fuch's dystrophy Sister   . Stroke Maternal Grandmother   . Lung cancer Maternal Grandmother   . Hypertension Maternal Grandmother   . Heart disease Paternal Grandmother   . Hypertension Paternal Grandmother     SOCIAL HISTORY:   Social   History   Tobacco Use  . Smoking status: Former Smoker    Packs/day: 1.00    Years: 4.00    Pack years: 4.00    Last attempt to quit: 11/18/1998    Years since quitting: 19.5  . Smokeless tobacco: Never Used  Substance Use Topics  . Alcohol use: No    Alcohol/week: 0.0 standard drinks     ALLERGIES:   Allergies  Allergen Reactions  . Ibuprofen Other (See Comments)    Avoids due to Crohns disease  . Morphine And Related Itching    itchy  . Prednisone Other (See Comments)    "crazy", hallucinations  . Adhesive [Tape] Rash    Rash with electrodes     CURRENT MEDICATIONS:   Current Outpatient Medications  Medication Sig Dispense  Refill  . acetaminophen (TYLENOL) 500 MG tablet Take 1,000 mg by mouth every 6 (six) hours as needed for headache (pain). Reported on 12/15/2015    . azaTHIOprine (IMURAN) 50 MG tablet TAKE 4 TABLETS BY MOUTH EVERY DAY 360 tablet 0  . buPROPion (WELLBUTRIN SR) 100 MG 12 hr tablet TAKE 1 TABLET(100 MG) BY MOUTH TWICE DAILY 180 tablet 1  . Calcium-Vitamin D-Vitamin K [id number] MG-UNT-MCG CHEW Chew 2 each by mouth daily.    . cetirizine (ZYRTEC) 10 MG tablet Take 10 mg by mouth daily.    . colestipol (COLESTID) 5 g granules Take 5 g by mouth daily. 300 g 5  . cyanocobalamin (,VITAMIN B-12,) 1000 MCG/ML injection INJECT AS DIRECTED EVERY 30 DAYS 3 mL 4  . diphenhydrAMINE (SOMINEX) 25 MG tablet Take 25 mg by mouth at bedtime.     Marland Kitchen ELIQUIS 5 MG TABS tablet TAKE 1 TABLET(5 MG) BY MOUTH TWICE DAILY 60 tablet 5  . escitalopram (LEXAPRO) 10 MG tablet Take 1 tablet (10 mg total) by mouth daily. 90 tablet 1  . fenofibrate 160 MG tablet Take 1 tablet (160 mg total) by mouth daily. 30 tablet 11  . ferrous sulfate 325 (65 FE) MG tablet Take 1-2 tablets daily with food - at least 1x/day (Patient taking differently: Take 325 mg by mouth 2 (two) times daily with a meal. )  3  . levonorgestrel (MIRENA) 20 MCG/24HR IUD 1 each by Intrauterine route once. Implanted April 2017    . Omega-3 Fatty Acids (FISH OIL) 1200 MG CAPS Take 1,200 mg by mouth 2 (two) times daily.    . vedolizumab (ENTYVIO) 300 MG injection Inject 300 mg into the vein every 8 (eight) weeks. 1 vial 6   No current facility-administered medications for this visit.     REVIEW OF SYSTEMS:   _0  denotes positive finding, _1  denotes negative finding Cardiac  Comments:  Chest pain or chest pressure:    Shortness of breath upon exertion:    Short of breath when lying flat:    Irregular heart rhythm:        Vascular    Pain in calf, thigh, or hip brought on by ambulation:    Pain in feet at night that wakes you up from your sleep:     Blood  clot in your veins:    Leg swelling:         Pulmonary    Oxygen at home:    Productive cough:     Wheezing:         Neurologic    Sudden weakness in arms or legs:     Sudden numbness in arms or legs:  Sudden onset of difficulty speaking or slurred speech:    Temporary loss of vision in one eye:     Problems with dizziness:         Gastrointestinal    Blood in stool:     Vomited blood:         Genitourinary    Burning when urinating:     Blood in urine:        Psychiatric    Major depression:         Hematologic    Bleeding problems:    Problems with blood clotting too easily:        Skin    Rashes or ulcers:        Constitutional    Fever or chills:      PHYSICAL EXAM:   Vitals:   06/19/18 1616  BP: 114/79  Pulse: 85  Resp: 16  Temp: 98.4 F (36.9 C)  TempSrc: Oral  SpO2: 98%  Weight: 224 lb (101.6 kg)  Height: _0  (1.626 m)    GENERAL: The patient is a well-nourished female, in no acute distress. The vital signs are documented above. CARDIAC: There is a regular rate and rhythm.  VASCULAR: No edema PULMONARY: Non-labored respirations MUSCULOSKELETAL: There are no major deformities or cyanosis. NEUROLOGIC: No focal weakness or paresthesias are detected. SKIN: There are no ulcers or rashes noted. PSYCHIATRIC: The patient has a normal affect.  STUDIES:   I have reviewed her ultrasound studies with the following findings: No evidence of DVT or superficial venous thrombosis in the left leg. Abnormal reflux times were noted in the right common femoral, femoral vein.  There is no evidence of DVT or superficial venous thrombosis.  MEDICAL ISSUES:   DVT/PE: The patient has met with hematology.  No underlying etiology for her DVT has been identified other than her Crohn's disease.  The recommendation was for long-term anticoagulation which I would agree with.  I feel that her IVC filter needs to be removed.  This is been scheduled for September 10.  I  will stop her Eliquis the day prior and restarted that day.  I discussed the details of the procedure.  All questions were answered.    Annamarie Major, MD [facility] [phone] Pager [phone]

## 2018-06-19 NOTE — Progress Notes (Signed)
[facility] [place]  Patient name: Lindsay Mccarthy MRN: [id number] DOB: August 16, 1976 Sex: female   REASON FOR VISIT:    Follow up DVT  HISOTRY OF PRESENT ILLNESS:   Lindsay Mccarthy is a 42 y.o. female who presented to the hospital on 11/27/2017 with a sudden onset of right leg swelling for 2 days.  She is also been having some pleuritic chest pain and shortness of breath.  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  The following day she returned and had AngioJet thrombectomy and balloon venoplasty.  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  Unfortunately, she began developing shortness of breath and was confirmed to have worsening PE by CT angiogram.  This required thrombolysis and IVC filter placement.  She was ultimately able to be discharged home.  She returns today for follow-up.    She does not routinely wear her compression stockings as she is not experiencing any leg swelling.    PAST MEDICAL HISTORY:   Past Medical History:  Diagnosis Date  . Anemia    related to Crohns flares  . Anxiety   . Arthritis    knees, feet, hands, wrists, related to Crohns flare  . Asthma    Childhood  . B12 deficiency    monitored/treated by Dr. Carlean Purl  . Cervical dysplasia   . Crohn's disease of small intestine ([place]) 06/24/2012   Diagnosed 1999, in [place]. Ileitis only then. Treated with Imuran Remicade and prednisone.  Noncompliant with therapy. 2004 return to care and was treated with Remicade prednisone Pentasa Cipro and Flagyl. 2006 status post right hemicolectomy. Subsequently treated with azathioprine and Cimzia. 200 mg every other week.  azathioprine was added in 2010.  Prometheus TP MT enzyme was negative. Has ha  . Depression   . DVT of upper extremity (deep vein thrombosis) ([place]) 11/28/2017  . Eczema    arms and behind knees, worse in winter  . History of recurrent UTI (urinary tract infection)   . HPV (human papilloma virus) infection   . Hyperlipemia   . Osteopenia of femur neck T. score -1.4 09/29/2011   09/2013 T-1.6  . Papanicolaou smear 12/12   last abnormal 2011  . Pulmonary embolism ([place]) 11/30/2017  . Scaphoid fracture of wrist 2013   left  . Seasonal allergic rhinitis   . Shingles 09/2015   R hip  . Wears glasses      FAMILY HISTORY:   Family History  Problem Relation Age of Onset  . Hypertension Mother   . Breast cancer Mother 50  . Bone cancer Mother        metastatic from breast  . Depression Mother   . Hypothyroidism Mother   . Colon polyps Father   . Diabetes Father        borderline  . Hypertension Father   . Hyperlipidemia Father   . Colon cancer Paternal Grandfather   . Multiple sclerosis Sister   . Alcohol abuse Sister   . Fuch's dystrophy Sister   . Stroke Maternal Grandmother   . Lung cancer Maternal Grandmother   . Hypertension Maternal Grandmother   . Heart disease Paternal Grandmother   . Hypertension Paternal Grandmother     SOCIAL HISTORY:   Social  History   Tobacco Use  . Smoking status: Former Smoker    Packs/day: 1.00    Years: 4.00    Pack years: 4.00    Last attempt to quit: 11/18/1998    Years since quitting: 19.5  . Smokeless tobacco: Never Used  Substance Use Topics  . Alcohol use: No    Alcohol/week: 0.0 standard drinks     ALLERGIES:   Allergies  Allergen Reactions  . Ibuprofen Other (See Comments)    Avoids due to Crohns disease  . Morphine And Related Itching    itchy  . Prednisone Other (See Comments)    "crazy", hallucinations  . Adhesive [Tape] Rash    Rash with electrodes     CURRENT MEDICATIONS:   Current Outpatient Medications  Medication Sig Dispense  Refill  . acetaminophen (TYLENOL) 500 MG tablet Take 1,000 mg by mouth every 6 (six) hours as needed for headache (pain). Reported on 12/15/2015    . azaTHIOprine (IMURAN) 50 MG tablet TAKE 4 TABLETS BY MOUTH EVERY DAY 360 tablet 0  . buPROPion (WELLBUTRIN SR) 100 MG 12 hr tablet TAKE 1 TABLET(100 MG) BY MOUTH TWICE DAILY 180 tablet 1  . Calcium-Vitamin D-Vitamin K [id number] MG-UNT-MCG CHEW Chew 2 each by mouth daily.    . cetirizine (ZYRTEC) 10 MG tablet Take 10 mg by mouth daily.    . colestipol (COLESTID) 5 g granules Take 5 g by mouth daily. 300 g 5  . cyanocobalamin (,VITAMIN B-12,) 1000 MCG/ML injection INJECT AS DIRECTED EVERY 30 DAYS 3 mL 4  . diphenhydrAMINE (SOMINEX) 25 MG tablet Take 25 mg by mouth at bedtime.     Marland Kitchen ELIQUIS 5 MG TABS tablet TAKE 1 TABLET(5 MG) BY MOUTH TWICE DAILY 60 tablet 5  . escitalopram (LEXAPRO) 10 MG tablet Take 1 tablet (10 mg total) by mouth daily. 90 tablet 1  . fenofibrate 160 MG tablet Take 1 tablet (160 mg total) by mouth daily. 30 tablet 11  . ferrous sulfate 325 (65 FE) MG tablet Take 1-2 tablets daily with food - at least 1x/day (Patient taking differently: Take 325 mg by mouth 2 (two) times daily with a meal. )  3  . levonorgestrel (MIRENA) 20 MCG/24HR IUD 1 each by Intrauterine route once. Implanted April 2017    . Omega-3 Fatty Acids (FISH OIL) 1200 MG CAPS Take 1,200 mg by mouth 2 (two) times daily.    . vedolizumab (ENTYVIO) 300 MG injection Inject 300 mg into the vein every 8 (eight) weeks. 1 vial 6   No current facility-administered medications for this visit.     REVIEW OF SYSTEMS:   _0  denotes positive finding, _1  denotes negative finding Cardiac  Comments:  Chest pain or chest pressure:    Shortness of breath upon exertion:    Short of breath when lying flat:    Irregular heart rhythm:        Vascular    Pain in calf, thigh, or hip brought on by ambulation:    Pain in feet at night that wakes you up from your sleep:     Blood  clot in your veins:    Leg swelling:         Pulmonary    Oxygen at home:    Productive cough:     Wheezing:         Neurologic    Sudden weakness in arms or legs:     Sudden numbness in arms or legs:  Sudden onset of difficulty speaking or slurred speech:    Temporary loss of vision in one eye:     Problems with dizziness:         Gastrointestinal    Blood in stool:     Vomited blood:         Genitourinary    Burning when urinating:     Blood in urine:        Psychiatric    Major depression:         Hematologic    Bleeding problems:    Problems with blood clotting too easily:        Skin    Rashes or ulcers:        Constitutional    Fever or chills:      PHYSICAL EXAM:   Vitals:   06/19/18 1616  BP: 114/79  Pulse: 85  Resp: 16  Temp: 98.4 F (36.9 C)  TempSrc: Oral  SpO2: 98%  Weight: 224 lb (101.6 kg)  Height: _0  (1.626 m)    GENERAL: The patient is a well-nourished female, in no acute distress. The vital signs are documented above. CARDIAC: There is a regular rate and rhythm.  VASCULAR: No edema PULMONARY: Non-labored respirations MUSCULOSKELETAL: There are no major deformities or cyanosis. NEUROLOGIC: No focal weakness or paresthesias are detected. SKIN: There are no ulcers or rashes noted. PSYCHIATRIC: The patient has a normal affect.  STUDIES:   I have reviewed her ultrasound studies with the following findings: No evidence of DVT or superficial venous thrombosis in the left leg. Abnormal reflux times were noted in the right common femoral, femoral vein.  There is no evidence of DVT or superficial venous thrombosis.  MEDICAL ISSUES:   DVT/PE: The patient has met with hematology.  No underlying etiology for her DVT has been identified other than her Crohn's disease.  The recommendation was for long-term anticoagulation which I would agree with.  I feel that her IVC filter needs to be removed.  This is been scheduled for September 10.  I  will stop her Eliquis the day prior and restarted that day.  I discussed the details of the procedure.  All questions were answered.    Annamarie Major, MD [facility] [phone] Pager [phone]

## 2018-06-20 ENCOUNTER — Other Ambulatory Visit: Payer: Self-pay | Admitting: *Deleted

## 2018-06-22 ENCOUNTER — Ambulatory Visit (INDEPENDENT_AMBULATORY_CARE_PROVIDER_SITE_OTHER): Payer: 59 | Admitting: Internal Medicine

## 2018-06-22 ENCOUNTER — Other Ambulatory Visit (INDEPENDENT_AMBULATORY_CARE_PROVIDER_SITE_OTHER): Payer: 59

## 2018-06-22 ENCOUNTER — Encounter: Payer: Self-pay | Admitting: Internal Medicine

## 2018-06-22 VITALS — BP 108/60 | HR 88 | Ht 64.0 in | Wt 228.0 lb

## 2018-06-22 DIAGNOSIS — K5000 Crohn's disease of small intestine without complications: Secondary | ICD-10-CM | POA: Diagnosis not present

## 2018-06-22 DIAGNOSIS — D500 Iron deficiency anemia secondary to blood loss (chronic): Secondary | ICD-10-CM

## 2018-06-22 DIAGNOSIS — K9089 Other intestinal malabsorption: Secondary | ICD-10-CM | POA: Insufficient documentation

## 2018-06-22 DIAGNOSIS — E538 Deficiency of other specified B group vitamins: Secondary | ICD-10-CM | POA: Diagnosis not present

## 2018-06-22 DIAGNOSIS — Z79899 Other long term (current) drug therapy: Secondary | ICD-10-CM

## 2018-06-22 DIAGNOSIS — Z7960 Long term (current) use of unspecified immunomodulators and immunosuppressants: Secondary | ICD-10-CM

## 2018-06-22 LAB — VITAMIN B12: Vitamin B-12: 192 pg/mL — ABNORMAL LOW (ref 211–911)

## 2018-06-22 LAB — CBC WITH DIFFERENTIAL/PLATELET
BASOS PCT: 0.3 % (ref 0.0–3.0)
Basophils Absolute: 0 10*3/uL (ref 0.0–0.1)
EOS PCT: 0.9 % (ref 0.0–5.0)
Eosinophils Absolute: 0.1 10*3/uL (ref 0.0–0.7)
HEMATOCRIT: 32.4 % — AB (ref 36.0–46.0)
HEMOGLOBIN: 10.7 g/dL — AB (ref 12.0–15.0)
LYMPHS PCT: 12 % (ref 12.0–46.0)
Lymphs Abs: 1 10*3/uL (ref 0.7–4.0)
MCHC: 33 g/dL (ref 30.0–36.0)
MCV: 89.9 fl (ref 78.0–100.0)
Monocytes Absolute: 0.9 10*3/uL (ref 0.1–1.0)
Monocytes Relative: 10.7 % (ref 3.0–12.0)
Neutro Abs: 6.5 10*3/uL (ref 1.4–7.7)
Neutrophils Relative %: 76.1 % (ref 43.0–77.0)
Platelets: 450 10*3/uL — ABNORMAL HIGH (ref 150.0–400.0)
RBC: 3.6 Mil/uL — ABNORMAL LOW (ref 3.87–5.11)
RDW: 14.6 % (ref 11.5–15.5)
WBC: 8.5 10*3/uL (ref 4.0–10.5)

## 2018-06-22 LAB — FERRITIN: Ferritin: 4.5 ng/mL — ABNORMAL LOW (ref 10.0–291.0)

## 2018-06-22 NOTE — Assessment & Plan Note (Signed)
Check B12 

## 2018-06-22 NOTE — Assessment & Plan Note (Signed)
recheck

## 2018-06-22 NOTE — Progress Notes (Signed)
Lindsay Mccarthy 42 y.o. May 04, 1976 [id number]  Assessment & Plan:  Crohn's disease of small intestine ([place]) Doing well Dc AZA today.  She is done well on the [facility] we talked about whether she needs to continue to take azathioprine and she would like to come off it.  I am not convinced the potential benefits outweigh the risks so as part of the reason. RTC 6 mos  Vitamin B12 deficiency Check B12  Iron deficiency anemia due to chronic blood loss recheck  Bile salt-induced diarrhea She has responded very well to the use of colestipol and will continue  Long-term use of immunosuppressant medication-Azathioprine and Entyvio Continue with Entyvio discontinuing azathioprine.  Consider Shingrix vaccine she wants to wait for now.   I appreciate the opportunity to care for this patient. CC: Rita Ohara, MD    Subjective:   Chief Complaint: Follow-up of Crohn's disease and bile salt diarrhea  HPI Lindsay Mccarthy is here reporting that things are going well.  She is about to have her IVC filter removed soon.  The colestipol made a big difference and has reduced her diarrhea and she is quite pleased with that.  Her mother did pass away earlier in the summer as expected.  Metastatic breast cancer. Allergies  Allergen Reactions  . Ibuprofen Other (See Comments)    Avoids due to Crohns disease  . Morphine And Related Itching    itchy  . Prednisone Other (See Comments)    "crazy", hallucinations  . Adhesive [Tape] Rash    Rash with electrodes   Current Meds  Medication Sig  . acetaminophen (TYLENOL) 500 MG tablet Take 1,000 mg by mouth every 6 (six) hours as needed for headache (pain). Reported on 12/15/2015  . buPROPion (WELLBUTRIN SR) 100 MG 12 hr tablet TAKE 1 TABLET(100 MG) BY MOUTH TWICE DAILY  . Calcium-Vitamin D-Vitamin K 076-226-33 MG-UNT-MCG CHEW Chew 2 each by mouth daily.  . cetirizine (ZYRTEC) 10 MG tablet Take 10 mg by mouth daily.  . colestipol (COLESTID) 5 g granules  Take 5 g by mouth daily.  . cyanocobalamin (,VITAMIN B-12,) 1000 MCG/ML injection INJECT AS DIRECTED EVERY 30 DAYS  . diphenhydrAMINE (SOMINEX) 25 MG tablet Take 25 mg by mouth at bedtime.   Marland Kitchen ELIQUIS 5 MG TABS tablet TAKE 1 TABLET(5 MG) BY MOUTH TWICE DAILY  . escitalopram (LEXAPRO) 10 MG tablet Take 1 tablet (10 mg total) by mouth daily.  . fenofibrate 160 MG tablet Take 1 tablet (160 mg total) by mouth daily.  . ferrous sulfate 325 (65 FE) MG tablet Take 1-2 tablets daily with food - at least 1x/day (Patient taking differently: Take 325 mg by mouth 2 (two) times daily with a meal. )  . levonorgestrel (MIRENA) 20 MCG/24HR IUD 1 each by Intrauterine route once. Implanted April 2017  . Omega-3 Fatty Acids (FISH OIL) 1200 MG CAPS Take 1,200 mg by mouth 2 (two) times daily.  . vedolizumab (ENTYVIO) 300 MG injection Inject 300 mg into the vein every 8 (eight) weeks.  . [DISCONTINUED] azaTHIOprine (IMURAN) 50 MG tablet TAKE 4 TABLETS BY MOUTH EVERY DAY   Past Medical History:  Diagnosis Date  . Anemia    related to Crohns flares  . Anxiety   . Arthritis    knees, feet, hands, wrists, related to Crohns flare  . Asthma    Childhood  . B12 deficiency    monitored/treated by Dr. Carlean Purl  . Cervical dysplasia   . Crohn's disease of small intestine ([place]) 06/24/2012  Diagnosed 1999, in [place]. Ileitis only then. Treated with Imuran Remicade and prednisone.  Noncompliant with therapy. 2004 return to care and was treated with Remicade prednisone Pentasa Cipro and Flagyl. 2006 status post right hemicolectomy. Subsequently treated with azathioprine and Cimzia. 200 mg every other week.  azathioprine was added in 2010. Prometheus TP MT enzyme was negative. Has ha  . Depression   . DVT of upper extremity (deep vein thrombosis) ([place]) 11/28/2017  . Eczema    arms and behind knees, worse in winter  . History of recurrent UTI (urinary tract infection)   . HPV (human papilloma virus) infection   .  Hyperlipemia   . Osteopenia of femur neck T. score -1.4 09/29/2011   09/2013 T-1.6  . Papanicolaou smear 12/12   last abnormal 2011  . Pulmonary embolism ([place]) 11/30/2017  . Scaphoid fracture of wrist 2013   left  . Seasonal allergic rhinitis   . Shingles 09/2015   R hip  . Wears glasses    Past Surgical History:  Procedure Laterality Date  . APPENDECTOMY    . CERVICAL BIOPSY  W/ LOOP ELECTRODE EXCISION  2009   ---paps normal since  . COLONOSCOPY  multiple   scanned  . ESOPHAGOGASTRODUODENOSCOPY  multiple   scanned  . HEMICOLECTOMY  2006  . IR ANGIOGRAM PULMONARY BILATERAL SELECTIVE  12/03/2017  . IR ANGIOGRAM SELECTIVE EACH ADDITIONAL VESSEL  12/03/2017  . IR ANGIOGRAM SELECTIVE EACH ADDITIONAL VESSEL  12/03/2017  . IR INFUSION THROMBOL ARTERIAL INITIAL (MS)  12/03/2017  . IR INFUSION THROMBOL ARTERIAL INITIAL (MS)  12/03/2017  . IR IVC FILTER PLMT / S&I /IMG GUID/MOD SED  12/04/2017  . IR THROMB F/U EVAL ART/VEN FINAL DAY ([place])  12/04/2017  . IR US GUIDE VASC ACCESS RIGHT  12/03/2017  . LEEP  2011   --done in [place]  . PERIPHERAL VASCULAR BALLOON ANGIOPLASTY Right 11/30/2017   Procedure: PERIPHERAL VASCULAR BALLOON ANGIOPLASTY;  Surgeon: Serafina Mitchell, MD;  Location: [place] [facility];  Service: Cardiovascular;  Laterality: Right;  . PERIPHERAL VASCULAR THROMBECTOMY N/A 11/29/2017   Procedure: PERIPHERAL VASCULAR THROMBECTOMY - THROMBOLYSIS;  Surgeon: Serafina Mitchell, MD;  Location: [place] [facility];  Service: Cardiovascular;  Laterality: N/A;  LYSIS CATHETER PLACEMENT ONLY  . PERIPHERAL VASCULAR THROMBECTOMY N/A 11/30/2017   Procedure: PERIPHERAL VASCULAR THROMBECTOMY - Lysis Recheck;  Surgeon: Serafina Mitchell, MD;  Location: [place] [facility];  Service: Cardiovascular;  Laterality: N/A;  . WISDOM TOOTH EXTRACTION     Social History   Social History Narrative   The patient is divorced.     Re-married Lindsay Mccarthy, partner of 10 years, on 10/10/16. 1 cats, 2 dogs, 1 cat   No  children - doesn't want any   [place].   Moved from [place] to [place] in 2013.   Past smoker   No alcohol   1 caffeinated beverages a day   She reports she is compliant with sunscreen given her increased risk of sun damage and skin cancer on azathioprine      Updated 12/15/17   family history includes Alcohol abuse in her sister; Bone cancer in her mother; Breast cancer (age of onset: 72) in her mother; Colon cancer in her paternal grandfather; Colon polyps in her father; Depression in her mother; Diabetes in her father; Fuch's dystrophy in her sister; Heart disease in her paternal grandmother; Hyperlipidemia in her father; Hypertension in her father, maternal grandmother, mother, and paternal grandmother; Hypothyroidism in her  mother; Lung cancer in her maternal grandmother; Multiple sclerosis in her sister; Stroke in her maternal grandmother.   Review of Systems As above  Objective:   Physical Exam BP 108/60 (BP Location: Left Arm, Patient Position: Sitting, Cuff Size: Normal)   Pulse 88   Ht 5' 4"  (1.626 m)   Wt 228 lb (103.4 kg)   BMI 39.14 kg/m  No acute distress Eyes anicteric Lungs clear Normal heart sounds Abdomen obese soft nontender Appropriate mood and affect and alert and oriented x3

## 2018-06-22 NOTE — Assessment & Plan Note (Addendum)
Doing well Dc AZA today.  She is done well on the [facility] we talked about whether she needs to continue to take azathioprine and she would like to come off it.  I am not convinced the potential benefits outweigh the risks so as part of the reason. RTC 6 mos

## 2018-06-22 NOTE — Patient Instructions (Addendum)
If you are age 40 or older, your body mass index should be between 23-30. Your Body mass index is 39.14 kg/m. If this is out of the aforementioned range listed, please consider follow up with your Primary Care Provider.  If you are age 63 or younger, your body mass index should be between 19-25. Your Body mass index is 39.14 kg/m. If this is out of the aformentioned range listed, please consider follow up with your Primary Care Provider.   Please discontinue the azathioprine.  Your provider has requested that you go to the basement level for lab work before leaving today. Press "B" on the elevator. The lab is located at the first door on the left as you exit the elevator.  Please follow up with Dr Carlean Purl in 6 months.    I appreciate the opportunity to care for you. Silvano Rusk, MD, [facility]

## 2018-06-22 NOTE — Assessment & Plan Note (Signed)
Continue with Entyvio discontinuing azathioprine.  Consider Shingrix vaccine she wants to wait for now.

## 2018-06-22 NOTE — Assessment & Plan Note (Signed)
She has responded very well to the use of colestipol and will continue

## 2018-06-23 ENCOUNTER — Other Ambulatory Visit: Payer: Self-pay

## 2018-06-23 DIAGNOSIS — E538 Deficiency of other specified B group vitamins: Secondary | ICD-10-CM

## 2018-06-23 DIAGNOSIS — D500 Iron deficiency anemia secondary to blood loss (chronic): Secondary | ICD-10-CM

## 2018-06-23 MED ORDER — CYANOCOBALAMIN 1000 MCG/ML IJ SOLN: INTRAMUSCULAR | 1 refills | Status: DC

## 2018-06-23 NOTE — Progress Notes (Signed)
cycano

## 2018-06-23 NOTE — Progress Notes (Signed)
B12 and ferritin are low  She needs B12 1000 ug weekly x 4 then stay on monthly  I think feraheme infusion x 2 and CBC ferritin 1 mo after second infusion are appropriate since she is taking iron and not improved

## 2018-06-23 NOTE — Addendum Note (Signed)
Addended by: Marlon Pel on: 06/23/2018 12:34 PM   Modules accepted: Orders

## 2018-07-06 ENCOUNTER — Telehealth: Payer: Self-pay | Admitting: *Deleted

## 2018-07-06 ENCOUNTER — Encounter: Payer: Self-pay | Admitting: Family Medicine

## 2018-07-06 ENCOUNTER — Encounter (HOSPITAL_COMMUNITY): Payer: 59

## 2018-07-06 NOTE — Telephone Encounter (Signed)
Spoke with patient's spouse([place] of [place]). Instructed to be at [facility] admitting at 11 am on 07/11/18. May have light b'fast of egg,toast, water then NPO past 6:30 am on 9/10. All other instructions unchanged and reminded to hold Eliquis x 1 day pre procedure and driver for home. To call if any further questions.

## 2018-07-06 NOTE — Telephone Encounter (Signed)
Message left on patient's voice mail that procedure time changed for 07/11/18 procedure. To arrive at [facility] admitting department at 11 am. Requested patient to call back to confirm.

## 2018-07-10 ENCOUNTER — Inpatient Hospital Stay (HOSPITAL_COMMUNITY): Admission: RE | Admit: 2018-07-10 | Payer: 59 | Source: Ambulatory Visit

## 2018-07-11 ENCOUNTER — Inpatient Hospital Stay: Admission: RE | Admit: 2018-07-11 | Payer: 59 | Source: Ambulatory Visit

## 2018-07-11 ENCOUNTER — Other Ambulatory Visit: Payer: 59

## 2018-07-11 ENCOUNTER — Encounter (HOSPITAL_COMMUNITY)
Admission: RE | Disposition: A | Discharge status: Home / Self Care | Payer: Self-pay | Source: Ambulatory Visit | Attending: Surgery

## 2018-07-11 ENCOUNTER — Ambulatory Visit (HOSPITAL_COMMUNITY)
Admission: RE | Admit: 2018-07-11 | Discharge: 2018-07-11 | Disposition: A | Discharge status: Home / Self Care | Payer: 59 | Source: Ambulatory Visit | Attending: Surgery | Admitting: Surgery

## 2018-07-11 ENCOUNTER — Ambulatory Visit: Payer: 59 | Admitting: Hematology

## 2018-07-11 DIAGNOSIS — F419 Anxiety disorder, unspecified: Secondary | ICD-10-CM | POA: Diagnosis not present

## 2018-07-11 DIAGNOSIS — Z885 Allergy status to narcotic agent status: Secondary | ICD-10-CM | POA: Insufficient documentation

## 2018-07-11 DIAGNOSIS — J45909 Unspecified asthma, uncomplicated: Secondary | ICD-10-CM | POA: Insufficient documentation

## 2018-07-11 DIAGNOSIS — Z7901 Long term (current) use of anticoagulants: Secondary | ICD-10-CM | POA: Diagnosis not present

## 2018-07-11 DIAGNOSIS — Z4689 Encounter for fitting and adjustment of other specified devices: Secondary | ICD-10-CM | POA: Insufficient documentation

## 2018-07-11 DIAGNOSIS — E785 Hyperlipidemia, unspecified: Secondary | ICD-10-CM | POA: Insufficient documentation

## 2018-07-11 DIAGNOSIS — Z86718 Personal history of other venous thrombosis and embolism: Secondary | ICD-10-CM | POA: Diagnosis not present

## 2018-07-11 DIAGNOSIS — Z87891 Personal history of nicotine dependence: Secondary | ICD-10-CM | POA: Diagnosis not present

## 2018-07-11 DIAGNOSIS — M85859 Other specified disorders of bone density and structure, unspecified thigh: Secondary | ICD-10-CM | POA: Diagnosis not present

## 2018-07-11 DIAGNOSIS — Z86711 Personal history of pulmonary embolism: Secondary | ICD-10-CM | POA: Insufficient documentation

## 2018-07-11 DIAGNOSIS — K5000 Crohn's disease of small intestine without complications: Secondary | ICD-10-CM | POA: Insufficient documentation

## 2018-07-11 DIAGNOSIS — M1990 Unspecified osteoarthritis, unspecified site: Secondary | ICD-10-CM | POA: Diagnosis not present

## 2018-07-11 DIAGNOSIS — Z9049 Acquired absence of other specified parts of digestive tract: Secondary | ICD-10-CM | POA: Insufficient documentation

## 2018-07-11 DIAGNOSIS — F329 Major depressive disorder, single episode, unspecified: Secondary | ICD-10-CM | POA: Diagnosis not present

## 2018-07-11 DIAGNOSIS — I82591 Chronic embolism and thrombosis of other specified deep vein of right lower extremity: Secondary | ICD-10-CM

## 2018-07-11 HISTORY — PX: IVC VENOGRAPHY: CATH118301

## 2018-07-11 HISTORY — PX: IVC FILTER REMOVAL: CATH118246

## 2018-07-11 LAB — POCT I-STAT, CHEM 8
BUN: 6 mg/dL (ref 6–20)
CALCIUM ION: 1.1 mmol/L — AB (ref 1.15–1.40)
CREATININE: 0.8 mg/dL (ref 0.44–1.00)
Chloride: 107 mmol/L (ref 98–111)
Glucose, Bld: 78 mg/dL (ref 70–99)
HCT: 29 % — ABNORMAL LOW (ref 36.0–46.0)
HEMOGLOBIN: 9.9 g/dL — AB (ref 12.0–15.0)
Potassium: 3.7 mmol/L (ref 3.5–5.1)
Sodium: 142 mmol/L (ref 135–145)
TCO2: 23 mmol/L (ref 22–32)

## 2018-07-11 LAB — PREGNANCY, URINE: Preg Test, Ur: NEGATIVE

## 2018-07-11 SURGERY — IVC FILTER REMOVAL
Anesthesia: LOCAL

## 2018-07-11 MED ORDER — LIDOCAINE HCL (PF) 1 % IJ SOLN
INTRAMUSCULAR | Status: DC | PRN
  Administered 2018-07-11: 10 mL

## 2018-07-11 MED ORDER — MIDAZOLAM HCL 2 MG/2ML IJ SOLN
INTRAMUSCULAR | Status: AC
  Filled 2018-07-11: qty 2

## 2018-07-11 MED ORDER — IODIXANOL 320 MG/ML IV SOLN
INTRAVENOUS | Status: DC | PRN
  Administered 2018-07-11: 30 mL via INTRAVENOUS

## 2018-07-11 MED ORDER — FENTANYL CITRATE (PF) 100 MCG/2ML IJ SOLN
INTRAMUSCULAR | Status: DC | PRN
  Administered 2018-07-11 (×3): 50 ug via INTRAVENOUS
  Administered 2018-07-11: 25 ug via INTRAVENOUS

## 2018-07-11 MED ORDER — FENTANYL CITRATE (PF) 100 MCG/2ML IJ SOLN
INTRAMUSCULAR | Status: AC
  Filled 2018-07-11: qty 2

## 2018-07-11 MED ORDER — HEPARIN (PORCINE) IN NACL 1000-0.9 UT/500ML-% IV SOLN
INTRAVENOUS | Status: DC | PRN
  Administered 2018-07-11: 500 mL

## 2018-07-11 MED ORDER — HEPARIN (PORCINE) IN NACL 1000-0.9 UT/500ML-% IV SOLN
INTRAVENOUS | Status: AC
  Filled 2018-07-11: qty 500

## 2018-07-11 MED ORDER — MIDAZOLAM HCL 2 MG/2ML IJ SOLN
INTRAMUSCULAR | Status: DC | PRN
  Administered 2018-07-11: 1 mg via INTRAVENOUS

## 2018-07-11 MED ORDER — FENTANYL CITRATE (PF) 100 MCG/2ML IJ SOLN
INTRAMUSCULAR | Status: DC | PRN
  Administered 2018-07-11: 25 ug via INTRAVENOUS

## 2018-07-11 MED ORDER — SODIUM CHLORIDE 0.9 % IV SOLN
INTRAVENOUS | Status: DC
  Administered 2018-07-11: 12:00:00 via INTRAVENOUS

## 2018-07-11 MED ORDER — MIDAZOLAM HCL 2 MG/2ML IJ SOLN
INTRAMUSCULAR | Status: DC | PRN
  Administered 2018-07-11: 1 mg via INTRAVENOUS
  Administered 2018-07-11 (×3): 2 mg via INTRAVENOUS

## 2018-07-11 SURGICAL SUPPLY — 13 items
BAG SNAP BAND KOVER 36X36 (MISCELLANEOUS) ×4 IMPLANT
CATH OMNI FLUSH 5F 65CM (CATHETERS) ×2 IMPLANT
COVER DOME SNAP 22 D (MISCELLANEOUS) ×2 IMPLANT
DEVICE ONE SNARE 15MM (VASCULAR PRODUCTS) ×2 IMPLANT
KIT MICROPUNCTURE NIT STIFF (SHEATH) ×2 IMPLANT
SHEATH PINNACLE ST 7F 45CM (SHEATH) ×2 IMPLANT
SHEATH PROBE COVER 6X72 (BAG) ×2 IMPLANT
STOPCOCK MORSE 400PSI 3WAY (MISCELLANEOUS) ×2 IMPLANT
SYRINGE MEDRAD AVANTA MACH 7 (SYRINGE) ×2 IMPLANT
TRAY PV CATH (CUSTOM PROCEDURE TRAY) ×2 IMPLANT
TUBING CIL FLEX 10 FLL-RA (TUBING) ×2 IMPLANT
TUBING HIGH PRESSURE 120CM (CONNECTOR) ×2 IMPLANT
WIRE BENTSON .035X145CM (WIRE) ×2 IMPLANT

## 2018-07-11 NOTE — Interval H&P Note (Signed)
History and Physical Interval Note:  07/11/2018 2:20 PM  Lindsay Mccarthy  has presented today for surgery, with the diagnosis of dvt  The various methods of treatment have been discussed with the patient and family. After consideration of risks, benefits and other options for treatment, the patient has consented to  Procedure(s): IVC FILTER REMOVAL (N/A) as a surgical intervention .  The patient's history has been reviewed, patient examined, no change in status, stable for surgery.  I have reviewed the patient's chart and labs.  Questions were answered to the patient's satisfaction.     Annamarie Major

## 2018-07-11 NOTE — Discharge Instructions (Signed)
Monitor site for bleeding, discharge, redness, swelling, pain, or temperature.  Call with any questions or concerns [phone]

## 2018-07-11 NOTE — Op Note (Signed)
    Patient name: Lindsay Mccarthy MRN: [id number] DOB: 04-23-1976 Sex: female  07/11/2018 Pre-operative Diagnosis: History of DVT with IVC filter Post-operative diagnosis:  Same Surgeon:  Annamarie Major Procedure Performed:  1.  Ultrasound-guided access, right internal jugular vein  2.  Inferior venacavogram  3.  Removal of inferior vena cava filter  4.  Conscious sedation (40 minutes)     Indications: Patient is previously undergone intervention for DVT.  She has a IVC filter placed and is here today for removal.  Procedure:  The patient was identified in the holding area and taken to room 8.  The patient was then placed supine on the table and prepped and draped in the usual sterile fashion.  A time out was called.  Conscious sedation was administered with use of IV fentanyl and Versed under continuous physician and nurse monitoring.  Heart rate, blood pressure, and oxygen saturation were continuously monitored.  Ultrasound was used to evaluate the right internal jugular vein which was widely patent and easily compressible.  A digital image was acquired.  1% lidocaine was used for local anesthesia.  A skin nick was made with an 11 blade.  The right internal jugular vein was then cannulated under ultrasound guidance with a micropuncture needle.  An 018 wire was advanced without resistance followed by micropuncture sheath.  A Bentson wire was then directed into the inferior vena cava.  A Omni Flush catheter was then advanced below the vena cava filter and a venacavogram was performed which showed no thrombus within the filter.  I then inserted a 7 French 45 cm sheath.  A 10 mm gooseneck snare was then used to grasp the hook on the filter.  This filter was then withdrawn into the sheath and then removed.  There were no complications.  Impression:  #1  Successful removal of IVC filter    V. Annamarie Major, M.D. [facility] Office: [phone] Pager:   [phone]

## 2018-07-11 NOTE — Progress Notes (Signed)
Spoke to Dr. Audry Riles for patient to restart Eliquis tonight.

## 2018-07-12 ENCOUNTER — Encounter (HOSPITAL_COMMUNITY): Payer: Self-pay | Admitting: Surgery

## 2018-07-19 ENCOUNTER — Emergency Department (HOSPITAL_COMMUNITY): Payer: 59

## 2018-07-19 ENCOUNTER — Emergency Department (HOSPITAL_COMMUNITY)
Admission: EM | Admit: 2018-07-19 | Discharge: 2018-07-19 | Disposition: A | Discharge status: Home / Self Care | Payer: 59 | Attending: Emergency Medicine | Admitting: Emergency Medicine

## 2018-07-19 ENCOUNTER — Other Ambulatory Visit: Payer: Self-pay

## 2018-07-19 ENCOUNTER — Encounter: Payer: Self-pay | Admitting: Family Medicine

## 2018-07-19 ENCOUNTER — Encounter (HOSPITAL_COMMUNITY): Payer: Self-pay

## 2018-07-19 DIAGNOSIS — R1031 Right lower quadrant pain: Secondary | ICD-10-CM | POA: Insufficient documentation

## 2018-07-19 DIAGNOSIS — R911 Solitary pulmonary nodule: Secondary | ICD-10-CM | POA: Insufficient documentation

## 2018-07-19 LAB — COMPREHENSIVE METABOLIC PANEL
ALK PHOS: 57 U/L (ref 38–126)
ALT: 52 U/L — AB (ref 0–44)
ANION GAP: 9 (ref 5–15)
AST: 36 U/L (ref 15–41)
Albumin: 3.6 g/dL (ref 3.5–5.0)
BILIRUBIN TOTAL: 0.3 mg/dL (ref 0.3–1.2)
BUN: 11 mg/dL (ref 6–20)
CALCIUM: 8.7 mg/dL — AB (ref 8.9–10.3)
CO2: 25 mmol/L (ref 22–32)
CREATININE: 0.81 mg/dL (ref 0.44–1.00)
Chloride: 107 mmol/L (ref 98–111)
Glucose, Bld: 83 mg/dL (ref 70–99)
Potassium: 3.7 mmol/L (ref 3.5–5.1)
Sodium: 141 mmol/L (ref 135–145)
TOTAL PROTEIN: 7.5 g/dL (ref 6.5–8.1)

## 2018-07-19 LAB — CBC WITH DIFFERENTIAL/PLATELET
BASOS ABS: 0 10*3/uL (ref 0.0–0.1)
BASOS PCT: 0 %
EOS ABS: 0 10*3/uL (ref 0.0–0.7)
Eosinophils Relative: 1 %
HCT: 32.7 % — ABNORMAL LOW (ref 36.0–46.0)
Hemoglobin: 10.3 g/dL — ABNORMAL LOW (ref 12.0–15.0)
Lymphocytes Relative: 15 %
Lymphs Abs: 1.3 10*3/uL (ref 0.7–4.0)
MCH: 27.4 pg (ref 26.0–34.0)
MCHC: 31.5 g/dL (ref 30.0–36.0)
MCV: 87 fL (ref 78.0–100.0)
Monocytes Absolute: 0.6 10*3/uL (ref 0.1–1.0)
Monocytes Relative: 8 %
Neutro Abs: 6.5 10*3/uL (ref 1.7–7.7)
Neutrophils Relative %: 76 %
PLATELETS: 389 10*3/uL (ref 150–400)
RBC: 3.76 MIL/uL — ABNORMAL LOW (ref 3.87–5.11)
RDW: 14 % (ref 11.5–15.5)
WBC: 8.5 10*3/uL (ref 4.0–10.5)

## 2018-07-19 LAB — URINALYSIS, ROUTINE W REFLEX MICROSCOPIC
Bilirubin Urine: NEGATIVE
Glucose, UA: NEGATIVE mg/dL
Hgb urine dipstick: NEGATIVE
KETONES UR: NEGATIVE mg/dL
Leukocytes, UA: NEGATIVE
NITRITE: NEGATIVE
PROTEIN: NEGATIVE mg/dL
Specific Gravity, Urine: 1.004 — ABNORMAL LOW (ref 1.005–1.030)
pH: 6 (ref 5.0–8.0)

## 2018-07-19 LAB — POC URINE PREG, ED: Preg Test, Ur: NEGATIVE

## 2018-07-19 MED ORDER — IOPAMIDOL (ISOVUE-300) INJECTION 61%
100.0000 mL | Freq: Once | INTRAVENOUS | Status: AC | PRN
  Administered 2018-07-19: 100 mL via INTRAVENOUS

## 2018-07-19 MED ORDER — FENTANYL CITRATE (PF) 100 MCG/2ML IJ SOLN
50.0000 ug | Freq: Once | INTRAMUSCULAR | Status: AC
  Administered 2018-07-19: 50 ug via INTRAVENOUS
  Filled 2018-07-19: qty 2

## 2018-07-19 MED ORDER — ONDANSETRON HCL 4 MG/2ML IJ SOLN
4.0000 mg | Freq: Once | INTRAMUSCULAR | Status: AC
  Administered 2018-07-19: 4 mg via INTRAVENOUS
  Filled 2018-07-19: qty 2

## 2018-07-19 MED ORDER — IOPAMIDOL (ISOVUE-300) INJECTION 61%
INTRAVENOUS | Status: AC
  Filled 2018-07-19: qty 100

## 2018-07-19 MED ORDER — SODIUM CHLORIDE 0.9 % IV BOLUS
500.0000 mL | Freq: Once | INTRAVENOUS | Status: AC
  Administered 2018-07-19: 500 mL via INTRAVENOUS

## 2018-07-19 NOTE — ED Triage Notes (Addendum)
Pt comes from home. Pt is AOx4 and ambulatory. Pt brought to ED via [facility]. Pt called 911 due to abdominal pain. Pt states her pain is currently about a 3.5 on scale of 1-10 and is going down. Pt has hx of Crohn's Disease. Last flare up was 6 months ago. Pt denies chest pain, N/V/D, SOB. Pt is currently on EMS stretcher texting on phone.

## 2018-07-19 NOTE — ED Notes (Signed)
Pt using restroom, attempting to provide urine specimen.

## 2018-07-19 NOTE — ED Provider Notes (Signed)
[facility] DEPT Provider Note   CSN: [id number] Arrival date & time: 07/19/18  1307     History   Chief Complaint Chief Complaint  Patient presents with  . Abdominal Pain    HPI MEGHA AGNES is a 42 y.o. female.  The history is provided by the patient. No language interpreter was used.  Abdominal Pain      BRYNDA HEICK is a 42 y.o. female who presents to the Emergency Department complaining of abdominal pain.  She has a hx/o Crohns dz and DVT/PE and presents for abdominal pain that began last night.  She had a small twinge of right sided abdominal pain that began last night after a BM and quickly resolved.  This morning following a bowel movement she had recurrent severe right sided pain, described as a charlie horse sensation.  Pain is constant but overall improving, nonradiating.  Denies fevers, nausea, vomiting, diarrhea.  She has chronic diarrhea, unchanged from baseline.  She sees Dr. Carlean Purl with GI.  She had an IVC filter removed one weeks ago.  Currently takes Intiviyo and Eliquis.  No prior similar sxs.    Past Medical History:  Diagnosis Date  . Anemia    related to Crohns flares  . Anxiety   . Arthritis    knees, feet, hands, wrists, related to Crohns flare  . Asthma    Childhood  . B12 deficiency    monitored/treated by Dr. Carlean Purl  . Cervical dysplasia   . Crohn's disease of small intestine ([place]) 06/24/2012   Diagnosed 1999, in [place]. Ileitis only then. Treated with Imuran Remicade and prednisone.  Noncompliant with therapy. 2004 return to care and was treated with Remicade prednisone Pentasa Cipro and Flagyl. 2006 status post right hemicolectomy. Subsequently treated with azathioprine and Cimzia. 200 mg every other week.  azathioprine was added in 2010. Prometheus TP MT enzyme was negative. Has ha  . Depression   . DVT of upper extremity (deep vein thrombosis) ([place]) 11/28/2017  . Eczema    arms and behind knees,  worse in winter  . History of recurrent UTI (urinary tract infection)   . HPV (human papilloma virus) infection   . Hyperlipemia   . Osteopenia of femur neck T. score -1.4 09/29/2011   09/2013 T-1.6  . Papanicolaou smear 12/12   last abnormal 2011  . Pulmonary embolism ([place]) 11/30/2017  . Scaphoid fracture of wrist 2013   left  . Seasonal allergic rhinitis   . Shingles 09/2015   R hip  . Wears glasses     Patient Active Problem List   Diagnosis Date Noted  . 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 Dvt femoral (deep venous thrombosis) ([place]) 11/30/2017  . Arm DVT (deep venous thromboembolism), acute, right ([place]) 11/29/2017  . Pulmonary embolus ([place]) 11/27/2017  . Iron deficiency anemia due to chronic blood loss 10/05/2017  . Heartburn 10/05/2017  . Other fatigue 10/05/2017  . Vitamin D deficiency 12/16/2015  . Depression, major, in remission ([place]) 10/03/2013  . Hypertriglyceridemia 07/03/2012  . Crohn's disease of small intestine ([place]) 06/24/2012  . Long-term use of immunosuppressant medication-Azathioprine and Entyvio 06/24/2012  . Vitamin B12 deficiency 06/19/2012  . Osteopenia of femur neck T. score -1.4 09/29/2011    Past Surgical History:  Procedure Laterality Date  . APPENDECTOMY    . CERVICAL BIOPSY  W/ LOOP ELECTRODE EXCISION  2009   ---paps normal since  . COLONOSCOPY  multiple   scanned  . ESOPHAGOGASTRODUODENOSCOPY  multiple  scanned  . HEMICOLECTOMY  2006  . IR ANGIOGRAM PULMONARY BILATERAL SELECTIVE  12/03/2017  . IR ANGIOGRAM SELECTIVE EACH ADDITIONAL VESSEL  12/03/2017  . IR ANGIOGRAM SELECTIVE EACH ADDITIONAL VESSEL  12/03/2017  . IR INFUSION THROMBOL ARTERIAL INITIAL (MS)  12/03/2017  . IR INFUSION THROMBOL ARTERIAL INITIAL (MS)  12/03/2017  . IR IVC FILTER PLMT / S&I /IMG GUID/MOD SED  12/04/2017  . IR THROMB F/U EVAL ART/VEN FINAL DAY (MS)  12/04/2017  . IR US GUIDE VASC ACCESS RIGHT  12/03/2017  . IVC FILTER REMOVAL N/A 07/11/2018   Procedure: IVC FILTER  REMOVAL;  Surgeon: Serafina Mitchell, MD;  Location: [facility];  Service: Cardiovascular;  Laterality: N/A;  . IVC VENOGRAPHY N/A 07/11/2018   Procedure: IVC Venography;  Surgeon: Serafina Mitchell, MD;  Location: [facility];  Service: Cardiovascular;  Laterality: N/A;  . LEEP  2011   --done in [place]  . PERIPHERAL VASCULAR BALLOON ANGIOPLASTY Right 11/30/2017   Procedure: PERIPHERAL VASCULAR BALLOON ANGIOPLASTY;  Surgeon: Serafina Mitchell, MD;  Location: [facility];  Service: Cardiovascular;  Laterality: Right;  . PERIPHERAL VASCULAR THROMBECTOMY N/A 11/29/2017   Procedure: PERIPHERAL VASCULAR THROMBECTOMY - THROMBOLYSIS;  Surgeon: Serafina Mitchell, MD;  Location: [facility];  Service: Cardiovascular;  Laterality: N/A;  LYSIS CATHETER PLACEMENT ONLY  . PERIPHERAL VASCULAR THROMBECTOMY N/A 11/30/2017   Procedure: PERIPHERAL VASCULAR THROMBECTOMY - Lysis Recheck;  Surgeon: Serafina Mitchell, MD;  Location: [facility];  Service: Cardiovascular;  Laterality: N/A;  . WISDOM TOOTH EXTRACTION       OB History    Gravida  0   Para  0   Term  0   Preterm  0   AB  0   Living  0     SAB  0   TAB  0   Ectopic  0   Multiple  0   Live Births               Home Medications    Prior to Admission medications   Medication Sig Start Date End Date Taking? Authorizing Provider  buPROPion (WELLBUTRIN SR) 100 MG 12 hr tablet TAKE 1 TABLET(100 MG) BY MOUTH TWICE DAILY 06/19/18  Yes Rita Ohara, MD  cetirizine (ZYRTEC) 10 MG tablet Take 10 mg by mouth at bedtime.    Yes [provider]  Cholecalciferol (VITAMIN D3) 2000 units TABS Take 2,000 Units by mouth every evening.    Yes [provider]  colestipol (COLESTID) 5 g granules Take 5 g by mouth daily. 04/12/18  Yes Gatha Mayer, MD  diphenhydrAMINE (BENADRYL) 25 MG tablet Take 25 mg by mouth at bedtime as needed for sleep.   Yes [provider]  ELIQUIS 5 MG TABS tablet  TAKE 1 TABLET(5 MG) BY MOUTH TWICE DAILY Patient taking differently: Take 5 mg by mouth 2 (two) times daily.  06/05/18  Yes Rita Ohara, MD  escitalopram (LEXAPRO) 10 MG tablet Take 1 tablet (10 mg total) by mouth daily. Patient taking differently: Take 10 mg by mouth at bedtime.  04/10/18  Yes Rita Ohara, MD  fenofibrate 160 MG tablet Take 1 tablet (160 mg total) by mouth daily. Patient taking differently: Take 160 mg by mouth at bedtime.  12/15/17  Yes Rita Ohara, MD  levonorgestrel (MIRENA) 20 MCG/24HR IUD 1 each by Intrauterine route once. Implanted April 2017 05/19/11  Yes [provider]  [place]  1 application topically daily as needed (arthritis pain). CBD Oil   Yes [provider]  vedolizumab (ENTYVIO) 300 MG injection Inject 300 mg into the vein every 8 (eight) weeks. 02/13/18  Yes Gatha Mayer, MD  acetaminophen (TYLENOL) 500 MG tablet Take 1,000 mg by mouth every 6 (six) hours as needed for headache (pain). Reported on 12/15/2015    [provider]  cyanocobalamin (,VITAMIN B-12,) 1000 MCG/ML injection INJECT AS DIRECTED EVERY 30 DAYS Patient not taking: Reported on 07/10/2018 03/14/18   Gatha Mayer, MD  cyanocobalamin (,VITAMIN B-12,) 1000 MCG/ML injection Inject 1000 mcg weekly x 4 then monthly 06/23/18   Gatha Mayer, MD  ferrous sulfate 325 (65 FE) MG tablet Take 1-2 tablets daily with food - at least 1x/day Patient not taking: Reported on 07/10/2018 10/05/17   Gatha Mayer, MD    Family History Family History  Problem Relation Age of Onset  . Hypertension Mother   . Breast cancer Mother 46  . Bone cancer Mother        metastatic from breast  . Depression Mother   . Hypothyroidism Mother   . Colon polyps Father   . Diabetes Father        borderline  . Hypertension Father   . Hyperlipidemia Father   . Colon cancer Paternal Grandfather   . Multiple sclerosis Sister   . Alcohol abuse Sister   . Fuch's dystrophy Sister     . Stroke Maternal Grandmother   . Lung cancer Maternal Grandmother   . Hypertension Maternal Grandmother   . Heart disease Paternal Grandmother   . Hypertension Paternal Grandmother     Social History Social History   Tobacco Use  . Smoking status: Former Smoker    Packs/day: 1.00    Years: 4.00    Pack years: 4.00    Last attempt to quit: 11/18/1998    Years since quitting: 19.6  . Smokeless tobacco: Never Used  Substance Use Topics  . Alcohol use: No    Alcohol/week: 0.0 standard drinks  . Drug use: No     Allergies   Ibuprofen; Morphine and related; Prednisone; and Adhesive [tape]   Review of Systems Review of Systems  Gastrointestinal: Positive for abdominal pain.  All other systems reviewed and are negative.    Physical Exam Updated Vital Signs BP 123/79   Pulse 71   Temp 98 F (36.7 C)   Resp 17   Ht 5' 4"  (1.626 m)   Wt 99 kg   SpO2 98%   BMI 37.46 kg/m   Physical Exam  Constitutional: She is oriented to person, place, and time. She appears well-developed and well-nourished.  HENT:  Head: Normocephalic and atraumatic.  Cardiovascular: Normal rate and regular rhythm.  No murmur heard. Pulmonary/Chest: Effort normal and breath sounds normal. No respiratory distress.  Abdominal: Soft. There is no rebound and no guarding.  Mild to moderate RLQ and right mid abdominal tenderness without guarding or rebound.    Musculoskeletal: She exhibits no edema or tenderness.  Neurological: She is alert and oriented to person, place, and time.  Skin: Skin is warm and dry.  Psychiatric: She has a normal mood and affect. Her behavior is normal.  Nursing note and vitals reviewed.    ED Treatments / Results  Labs (all labs ordered are listed, but only abnormal results are displayed) Labs Reviewed  COMPREHENSIVE METABOLIC PANEL - Abnormal; Notable for the following components:      Result Value  Calcium 8.7 (*)    ALT 52 (*)    All other components within  normal limits  CBC WITH DIFFERENTIAL/PLATELET - Abnormal; Notable for the following components:   RBC 3.76 (*)    Hemoglobin 10.3 (*)    HCT 32.7 (*)    All other components within normal limits  URINALYSIS, ROUTINE W REFLEX MICROSCOPIC - Abnormal; Notable for the following components:   Color, Urine STRAW (*)    Specific Gravity, Urine 1.004 (*)    All other components within normal limits  POC URINE PREG, ED    EKG None  Radiology Ct Abdomen Pelvis W Contrast  Result Date: 07/19/2018 CLINICAL DATA:  Right-sided abdominal pain. Recent IVC filter removal. EXAM: CT ABDOMEN AND PELVIS WITH CONTRAST TECHNIQUE: Multidetector CT imaging of the abdomen and pelvis was performed using the standard protocol following bolus administration of intravenous contrast. CONTRAST:  <See Chart> ISOVUE-300 IOPAMIDOL (ISOVUE-300) INJECTION 61% COMPARISON:  CT abdomen pelvis 12/02/2017 FINDINGS: LOWER CHEST: Left lower lobe pulmonary nodule measures 8 x 6 mm, unchanged. HEPATOBILIARY: The hepatic contours and density are normal. There is no intra- or extrahepatic biliary dilatation. The gallbladder is normal. PANCREAS: The pancreatic parenchymal contours are normal and there is no ductal dilatation. There is no peripancreatic fluid collection. SPLEEN: Normal. ADRENALS/URINARY TRACT: --Adrenal glands: Normal. --Right kidney/ureter: There is a lower pole cyst measuring 4.3 cm. No hydronephrosis, nephroureterolithiasis, perinephric stranding or solid renal mass. --Left kidney/ureter: No hydronephrosis, nephroureterolithiasis, perinephric stranding or solid renal mass. --Urinary bladder: Normal for degree of distention STOMACH/BOWEL: --Stomach/Duodenum: There is no hiatal hernia or other gastric abnormality. The duodenal course and caliber are normal. --Small bowel: No dilatation or inflammation. --Colon: No focal postsurgical changes of the ascending colon and terminal ileum. --Appendix: Surgically absent.  VASCULAR/LYMPHATIC: Normal course and caliber of the major abdominal vessels. No abdominal or pelvic lymphadenopathy. REPRODUCTIVE: T-shaped uterine contraceptive device. No adnexal mass. MUSCULOSKELETAL. No bony spinal canal stenosis or focal osseous abnormality. OTHER: None. IMPRESSION: 1. Postsurgical changes at the terminal ileum and proximal colon without acute gastrointestinal abnormality. 2. Left lower lobe pulmonary nodule measuring up to 8 mm. Non-contrast chest CT at 6-12 months is recommended. If the nodule is stable at time of repeat CT, then future CT at 18-24 months (from today's scan) is considered optional for low-risk patients, but is recommended for high-risk patients. This recommendation follows the consensus statement: Guidelines for Management of Incidental Pulmonary Nodules Detected on CT Images: From the Fleischner Society 2017; Radiology 2017; 284:228-243. Electronically Signed   By: Ulyses Jarred M.D.   On: 07/19/2018 18:50    Procedures Procedures (including critical care time)  Medications Ordered in ED Medications  sodium chloride 0.9 % bolus 500 mL (0 mLs Intravenous Stopped 07/19/18 1713)  fentaNYL (SUBLIMAZE) injection 50 mcg (50 mcg Intravenous Given 07/19/18 1540)  ondansetron (ZOFRAN) injection 4 mg (4 mg Intravenous Given 07/19/18 1539)  fentaNYL (SUBLIMAZE) injection 50 mcg (50 mcg Intravenous Given 07/19/18 1920)  iopamidol (ISOVUE-300) 61 % injection 100 mL ( Intravenous Canceled Entry 07/19/18 1803)     Initial Impression / Assessment and Plan / ED Course  I have reviewed the triage vital signs and the nursing notes.  Pertinent labs & imaging results that were available during my care of the patient were reviewed by me and considered in my medical decision making (see chart for details).     Patient with history of Crohn's disease here for evaluation of severe right sided abdominal pain. She does have a  history of DVT and is currently anticoagulated. She is  one week status post retrieval of an IVC filter. She does have tenderness on examination without peritoneal findings. Labs are reassuring. CT abdomen obtained, which is negative for intra-abdominal hemorrhage, mass lesion, diverticulitis. Discussed with patient home care for abdominal pain of unclear etiology. Discussed outpatient follow-up as well as return precautions. Discussed with patient finding of pulmonary nodule and need for PCP follow-up.  Final Clinical Impressions(s) / ED Diagnoses   Final diagnoses:  Right lower quadrant abdominal pain  Pulmonary nodule    ED Discharge Orders    None       Quintella Reichert, MD 07/19/18 [id number]

## 2018-07-19 NOTE — Discharge Instructions (Addendum)
The cause of your pain was not identified today.  You can take tylenol, available over the counter for pain.  Your CT scan showed a pulmonary nodule - please follow up with your family doctor for further evaluation.  Get rechecked immediately if you have any new or concerning symptoms.

## 2018-07-21 ENCOUNTER — Other Ambulatory Visit (HOSPITAL_COMMUNITY): Payer: Self-pay | Admitting: *Deleted

## 2018-07-22 ENCOUNTER — Ambulatory Visit
Admission: RE | Admit: 2018-07-22 | Discharge: 2018-07-22 | Disposition: A | Discharge status: Home / Self Care | Payer: 59 | Source: Ambulatory Visit | Attending: Neurology | Admitting: Neurology

## 2018-07-22 DIAGNOSIS — R261 Paralytic gait: Secondary | ICD-10-CM

## 2018-07-22 DIAGNOSIS — R292 Abnormal reflex: Secondary | ICD-10-CM

## 2018-07-22 DIAGNOSIS — Z820 Family history of epilepsy and other diseases of the nervous system: Secondary | ICD-10-CM

## 2018-07-22 DIAGNOSIS — H539 Unspecified visual disturbance: Secondary | ICD-10-CM

## 2018-07-22 MED ORDER — GADOBENATE DIMEGLUMINE 529 MG/ML IV SOLN
20.0000 mL | Freq: Once | INTRAVENOUS | Status: AC | PRN
  Administered 2018-07-22: 20 mL via INTRAVENOUS

## 2018-07-24 ENCOUNTER — Ambulatory Visit
Admission: RE | Admit: 2018-07-24 | Discharge: 2018-07-24 | Disposition: A | Discharge status: Home / Self Care | Payer: 59 | Source: Ambulatory Visit | Attending: Neurology | Admitting: Neurology

## 2018-07-24 ENCOUNTER — Ambulatory Visit (HOSPITAL_COMMUNITY)
Admission: RE | Admit: 2018-07-24 | Discharge: 2018-07-24 | Disposition: A | Discharge status: Home / Self Care | Payer: 59 | Source: Ambulatory Visit | Attending: Internal Medicine | Admitting: Internal Medicine

## 2018-07-24 DIAGNOSIS — D500 Iron deficiency anemia secondary to blood loss (chronic): Secondary | ICD-10-CM | POA: Diagnosis present

## 2018-07-24 DIAGNOSIS — Z820 Family history of epilepsy and other diseases of the nervous system: Secondary | ICD-10-CM

## 2018-07-24 DIAGNOSIS — H539 Unspecified visual disturbance: Secondary | ICD-10-CM

## 2018-07-24 DIAGNOSIS — R292 Abnormal reflex: Secondary | ICD-10-CM

## 2018-07-24 DIAGNOSIS — R261 Paralytic gait: Secondary | ICD-10-CM

## 2018-07-24 MED ORDER — GADOBENATE DIMEGLUMINE 529 MG/ML IV SOLN
20.0000 mL | Freq: Once | INTRAVENOUS | Status: AC | PRN
  Administered 2018-07-24: 20 mL via INTRAVENOUS

## 2018-07-24 MED ORDER — SODIUM CHLORIDE 0.9 % IV SOLN
510.0000 mg | INTRAVENOUS | Status: DC
  Administered 2018-07-24: 510 mg via INTRAVENOUS
  Filled 2018-07-24: qty 17

## 2018-07-24 NOTE — Discharge Instructions (Signed)

## 2018-07-24 NOTE — Progress Notes (Signed)
NEUROLOGY FOLLOW UP OFFICE NOTE  Lindsay Mccarthy [id number]  HISTORY OF PRESENT ILLNESS: Lindsay Mccarthy is a 42 year old right-handed female with Crohn's disease, depression and anxiety who follows up for evaluation of multiple sclerosis.    UPDATE: She underwent imaging, which were personally reviewed: 07/22/18:  MRI Cervical & Thoracic spine w/wo contrast:  Multiple T2/STIR hyperintense signal within the spinal cord at C2, C3-C4, C6-C7, T1, T3-T4, T6-T7, and T12 levels, all non-enhancing.  She also demonstrates degenerative spinal stenosis at C4-C5 and C5-C6 with mild spinal cord mass effect. 07/24/18:  MRI Brain w/wo contrast:  Mild hyperintense in the periventricular and juxtacortical white matter with signal running perpendicular from the lateral ventricle and involving the corpus callosum, non-enhancing.  HISTORY: She reports history of various neurologic symptoms since her late 75s: -  Urinary incontinence:  It started about a year ago and has gotten worse.  It occurs suddenly, once or twice a month, usually at home and while standing, such as while washing dishes with the water running.  It occurs during fatigued or emotional stress as well.  She denies increased urinary frequency or dysuria.  No perineal numbness.  No low back pain.   - She reports poor night vision with difficulty seeing while driving at night. She has trouble focusing.  Recent eye exam showed poor "refocusing", which would normally be seen in an older adult.  Her sister had Fuchs dystrophy but she doesn't have it. -  She reports numbness and tingling in her hands when she wakes up in the morning or in the evening after work. -  She reports abnormal gait.  It feels like her feet are stuck in cement.  She has trouble lifting her feet.  It causes hip pain.  She trips over her feet.  It is worse when there is an extreme change in temperature.  She started having trouble in her mid-30s.  Worse over the past 2 years.   She reports a stabbing pain in her hips or knees.  She reports previously being diagnosed with a pinched nerve in the back that caused a left foot drop in the 20s. She had PT and injections which was ineffective.  No numbness in the legs.   -  When she throws a ball, her hand doesn't release immediately.  There is a delay until she is able to release the ball.  She has similar problems with her left hand.  No neck pain. -  She reports fatigued.  She needs to take a nap during the day.  She is concerned that these symptoms may be due to multiple sclerosis.  One of her two sisters has multiple sclerosis.  Other history:  She has Crohn's disease.  Earlier this year, she developed a DVT in her right leg.  She also was found to have B12 deficiency and is being treated with injections.  01/09/18 LABS:  B12 143, Sed rate 18.  Hypercoagulable panel unremarkable, including lupus anticoagulant panel, dRVVT mix, beta-2-glycoprotein, cardiolipin antibodies, prothrombin gene mutation, factor V Leiden.   PAST MEDICAL HISTORY: Past Medical History:  Diagnosis Date  . Anemia    related to Crohns flares  . Anxiety   . Arthritis    knees, feet, hands, wrists, related to Crohns flare  . Asthma    Childhood  . B12 deficiency    monitored/treated by Dr. Carlean Purl  . Cervical dysplasia   . Crohn's disease of small intestine ([place]) 06/24/2012   Diagnosed 1999,  in [place]. Ileitis only then. Treated with Imuran Remicade and prednisone.  Noncompliant with therapy. 2004 return to care and was treated with Remicade prednisone Pentasa Cipro and Flagyl. 2006 status post right hemicolectomy. Subsequently treated with azathioprine and Cimzia. 200 mg every other week.  azathioprine was added in 2010. Prometheus TP MT enzyme was negative. Has ha  . Depression   . DVT of upper extremity (deep vein thrombosis) ([place]) 11/28/2017  . Eczema    arms and behind knees, worse in winter  . History of recurrent UTI (urinary tract  infection)   . HPV (human papilloma virus) infection   . Hyperlipemia   . Osteopenia of femur neck T. score -1.4 09/29/2011   09/2013 T-1.6  . Papanicolaou smear 12/12   last abnormal 2011  . Pulmonary embolism ([place]) 11/30/2017  . Scaphoid fracture of wrist 2013   left  . Seasonal allergic rhinitis   . Shingles 09/2015   R hip  . Wears glasses     MEDICATIONS: Current Outpatient Medications on File Prior to Visit  Medication Sig Dispense Refill  . acetaminophen (TYLENOL) 500 MG tablet Take 1,000 mg by mouth every 6 (six) hours as needed for headache (pain). Reported on 12/15/2015    . buPROPion (WELLBUTRIN SR) 100 MG 12 hr tablet TAKE 1 TABLET(100 MG) BY MOUTH TWICE DAILY 180 tablet 1  . cetirizine (ZYRTEC) 10 MG tablet Take 10 mg by mouth at bedtime.     . Cholecalciferol (VITAMIN D3) 2000 units TABS Take 2,000 Units by mouth every evening.     . colestipol (COLESTID) 5 g granules Take 5 g by mouth daily. 300 g 5  . cyanocobalamin (,VITAMIN B-12,) 1000 MCG/ML injection INJECT AS DIRECTED EVERY 30 DAYS (Patient not taking: Reported on 07/10/2018) 3 mL 4  . cyanocobalamin (,VITAMIN B-12,) 1000 MCG/ML injection Inject 1000 mcg weekly x 4 then monthly 10 mL 1  . diphenhydrAMINE (BENADRYL) 25 MG tablet Take 25 mg by mouth at bedtime as needed for sleep.    Marland Kitchen ELIQUIS 5 MG TABS tablet TAKE 1 TABLET(5 MG) BY MOUTH TWICE DAILY (Patient taking differently: Take 5 mg by mouth 2 (two) times daily. ) 60 tablet 5  . escitalopram (LEXAPRO) 10 MG tablet Take 1 tablet (10 mg total) by mouth daily. (Patient taking differently: Take 10 mg by mouth at bedtime. ) 90 tablet 1  . fenofibrate 160 MG tablet Take 1 tablet (160 mg total) by mouth daily. (Patient taking differently: Take 160 mg by mouth at bedtime. ) 30 tablet 11  . ferrous sulfate 325 (65 FE) MG tablet Take 1-2 tablets daily with food - at least 1x/day (Patient not taking: Reported on 07/10/2018)  3  . levonorgestrel (MIRENA) 20 MCG/24HR IUD 1 each  by Intrauterine route once. Implanted April 2017    . OVER THE COUNTER MEDICATION Apply 1 application topically daily as needed (arthritis pain). CBD Oil    . vedolizumab (ENTYVIO) 300 MG injection Inject 300 mg into the vein every 8 (eight) weeks. 1 vial 6   Current Facility-Administered Medications on File Prior to Visit  Medication Dose Route Frequency Provider Last Rate Last Dose  . ferumoxytol (FERAHEME) 510 mg in sodium chloride 0.9 % 100 mL IVPB  510 mg Intravenous Weekly Gatha Mayer, MD 468 mL/hr at 07/24/18 1419      ALLERGIES: Allergies  Allergen Reactions  . Ibuprofen Other (See Comments)    Avoids due to Crohns disease  . Morphine And Related Itching  itchy  . Prednisone Other (See Comments)    "crazy", hallucinations  . Adhesive [Tape] Rash    Rash with electrodes    FAMILY HISTORY: Family History  Problem Relation Age of Onset  . Hypertension Mother   . Breast cancer Mother 32  . Bone cancer Mother        metastatic from breast  . Depression Mother   . Hypothyroidism Mother   . Colon polyps Father   . Diabetes Father        borderline  . Hypertension Father   . Hyperlipidemia Father   . Colon cancer Paternal Grandfather   . Multiple sclerosis Sister   . Alcohol abuse Sister   . Fuch's dystrophy Sister   . Stroke Maternal Grandmother   . Lung cancer Maternal Grandmother   . Hypertension Maternal Grandmother   . Heart disease Paternal Grandmother   . Hypertension Paternal Grandmother    SOCIAL HISTORY: Social History   Socioeconomic History  . Marital status: Married    Spouse name: Marcello Moores  . Number of children: 0  . Years of education: Not on file  . Highest education level: Associate degree: occupational, Hotel manager, or vocational program  Occupational History  . Occupation: Hydrographic surveyor: [employer]  . Financial resource strain: Not on file  . Food insecurity:    Worry: Not on file    Inability: Not  on file  . Transportation needs:    Medical: Not on file    Non-medical: Not on file  Tobacco Use  . Smoking status: Former Smoker    Packs/day: 1.00    Years: 4.00    Pack years: 4.00    Last attempt to quit: 11/18/1998    Years since quitting: 19.6  . Smokeless tobacco: Never Used  Substance and Sexual Activity  . Alcohol use: No    Alcohol/week: 0.0 standard drinks  . Drug use: No  . Sexual activity: Yes    Partners: Male    Birth control/protection: IUD    Comment: Mirena inserted 01/2016  Lifestyle  . Physical activity:    Days per week: Not on file    Minutes per session: Not on file  . Stress: Not on file  Relationships  . Social connections:    Talks on phone: Not on file    Gets together: Not on file    Attends religious service: Not on file    Active member of club or organization: Not on file    Attends meetings of clubs or organizations: Not on file    Relationship status: Not on file  . Intimate partner violence:    Fear of current or ex partner: Not on file    Emotionally abused: Not on file    Physically abused: Not on file    Forced sexual activity: Not on file  Other Topics Concern  . Not on file  Social History Narrative   The patient is divorced.     Re-married Marcello Moores, partner of 10 years, on 10/10/16. 1 cats, 2 dogs, 1 cat   No children - doesn't want any   [place].   Moved from [place] to [place] in 2013.   Past smoker   No alcohol   1 caffeinated beverages a day   She reports she is compliant with sunscreen given her increased risk of sun damage and skin cancer on azathioprine      Updated 12/15/17  REVIEW OF SYSTEMS: Constitutional: No fevers, chills, or sweats, no generalized fatigue, change in appetite Eyes: No visual changes, double vision, eye pain Ear, nose and throat: No hearing loss, ear pain, nasal congestion, sore throat Cardiovascular: No chest pain, palpitations Respiratory:  No shortness of  breath at rest or with exertion, wheezes GastrointestinaI: No nausea, vomiting, diarrhea, abdominal pain, fecal incontinence Genitourinary:  No dysuria, urinary retention or frequency Musculoskeletal:  No neck pain, back pain Integumentary: No rash, pruritus, skin lesions Neurological: as above Psychiatric: No depression, insomnia, anxiety Endocrine: No palpitations, fatigue, diaphoresis, mood swings, change in appetite, change in weight, increased thirst Hematologic/Lymphatic:  No purpura, petechiae. Allergic/Immunologic: no itchy/runny eyes, nasal congestion, recent allergic reactions, rashes  PHYSICAL EXAM: Blood pressure 102/70, pulse 96, height 5' 4"  (1.626 m), weight 228 lb (103.4 kg), SpO2 97 %. General: No acute distress.  Patient appears well-groomed.    IMPRESSION: Demyelinating disease.  Multiple sclerosis possible but given the predominant involvement of the spinal cord compared to the brain, I am concerned about NMO as well.  PLAN: 1.  Check NMO/AQP4+ antibody and anti-MOG 2.  Further recommendations pending results. 3.  Follow up after testing.  25 minutes spent face to face with patient, 100% spent discussing MRI results, differential diagnosis and plan.  Metta Clines, DO  CC: Rita Ohara, MD

## 2018-07-25 ENCOUNTER — Ambulatory Visit (INDEPENDENT_AMBULATORY_CARE_PROVIDER_SITE_OTHER): Payer: 59 | Admitting: Neurology

## 2018-07-25 ENCOUNTER — Encounter: Payer: Self-pay | Admitting: Neurology

## 2018-07-25 ENCOUNTER — Encounter: Payer: Self-pay | Admitting: Family Medicine

## 2018-07-25 VITALS — BP 102/70 | HR 96 | Ht 64.0 in | Wt 228.0 lb

## 2018-07-25 DIAGNOSIS — G379 Demyelinating disease of central nervous system, unspecified: Secondary | ICD-10-CM

## 2018-07-25 NOTE — Patient Instructions (Signed)
MRI does reveal evidence of demyelinating disease.  Multiple sclerosis is a type of demyelinating disease.  However, since the findings are predominantly seen in the spinal cord and less in the brain, I want to check for another type of demyelinating disease, called neuromyelitis optica. 1.  We will check blood work looking for antibodies to neuromyelitis optica:  NMO antibody and anti-MOG antibody.  If testing is negative, I would think you most likely have multiple sclerosis 2.  Further recommendations pending these lab results.  In the meantime, if the labs are negative, I will decide if we need to do a spinal tap (in which case you would need to stop Eliquis). 3.  Follow up after testing.

## 2018-07-26 ENCOUNTER — Other Ambulatory Visit: Payer: Self-pay

## 2018-07-26 MED ORDER — GABAPENTIN 100 MG PO CAPS: 300.0000 mg | ORAL_CAPSULE | Freq: Every day | ORAL | 3 refills | Status: DC

## 2018-07-28 ENCOUNTER — Other Ambulatory Visit (HOSPITAL_COMMUNITY): Payer: Self-pay | Admitting: *Deleted

## 2018-07-31 ENCOUNTER — Ambulatory Visit (HOSPITAL_COMMUNITY)
Admission: RE | Admit: 2018-07-31 | Discharge: 2018-07-31 | Disposition: A | Discharge status: Home / Self Care | Payer: 59 | Source: Ambulatory Visit | Attending: Internal Medicine | Admitting: Internal Medicine

## 2018-07-31 DIAGNOSIS — D500 Iron deficiency anemia secondary to blood loss (chronic): Secondary | ICD-10-CM | POA: Diagnosis present

## 2018-07-31 MED ORDER — SODIUM CHLORIDE 0.9 % IV SOLN
510.0000 mg | INTRAVENOUS | Status: AC
  Administered 2018-07-31: 510 mg via INTRAVENOUS
  Filled 2018-07-31: qty 17

## 2018-08-04 ENCOUNTER — Telehealth: Payer: Self-pay | Admitting: Neurology

## 2018-08-04 DIAGNOSIS — G379 Demyelinating disease of central nervous system, unspecified: Secondary | ICD-10-CM

## 2018-08-04 NOTE — Telephone Encounter (Signed)
Patient called and would like you to send her orders for labs to [facility] instead of [facility] is not in her Network.  She said it would cost 2500 through [place]. Thanks

## 2018-08-07 NOTE — Telephone Encounter (Signed)
Called and advise Pt, lab request faxed to [facility]

## 2018-08-15 ENCOUNTER — Other Ambulatory Visit: Payer: Self-pay | Admitting: Neurology

## 2018-08-15 ENCOUNTER — Telehealth: Payer: Self-pay | Admitting: Neurology

## 2018-08-15 NOTE — Telephone Encounter (Signed)
Clarise Cruz called from [facility] and left a vm on this patient. They are needing clarification the Anti-MOG antibody that was ordered for this patient. Please call back at [phone] ext [phone]. Thanks!

## 2018-08-15 NOTE — Telephone Encounter (Signed)
Spoke with Clarise Cruz, per Dr Tomi Likens, cancel the MOG, run the [facility] [place] only

## 2018-08-19 LAB — NEUROMYELITIS OPTICA AUTOAB, IGG: NMO IgG Autoantibodies: <1.5 U/mL (ref 0.0–3.0)

## 2018-08-23 ENCOUNTER — Encounter: Payer: Self-pay | Admitting: Family Medicine

## 2018-08-23 ENCOUNTER — Other Ambulatory Visit: Payer: Self-pay | Admitting: *Deleted

## 2018-08-23 DIAGNOSIS — G379 Demyelinating disease of central nervous system, unspecified: Secondary | ICD-10-CM

## 2018-08-25 ENCOUNTER — Telehealth: Payer: Self-pay | Admitting: *Deleted

## 2018-08-25 ENCOUNTER — Telehealth: Payer: Self-pay | Admitting: Neurology

## 2018-08-25 NOTE — Progress Notes (Signed)
Left message giving patient results and instructed her to call to schedule an appointment.

## 2018-08-25 NOTE — Telephone Encounter (Signed)
Patient lmom returning a call to Nurse Caryl Pina . Please Call. Thanks

## 2018-08-25 NOTE — Telephone Encounter (Signed)
-----   Message from Chester Holstein, LPN sent at 76/15/1834 11:12 AM EDT -----   ----- Message ----- From: Pieter Partridge, DO Sent: 08/21/2018   7:30 AM EDT To: Clois Comber, CMA  Test for alternative demyelinating disease (neuromyelitis optica) is negative.  Therefore, I suspect MS.  Please have her follow up to discuss treatment options.

## 2018-08-25 NOTE — Telephone Encounter (Signed)
Left message for patient to call me back. 

## 2018-08-25 NOTE — Telephone Encounter (Signed)
Called patient back and gave her results and instructed her to call for a follow up appointment.

## 2018-09-07 ENCOUNTER — Telehealth: Payer: Self-pay | Admitting: Neurology

## 2018-09-11 NOTE — Progress Notes (Signed)
NEUROLOGY FOLLOW UP OFFICE NOTE  Lindsay Mccarthy [id number]  HISTORY OF PRESENT ILLNESS: Lindsay Mccarthy is a 42 year old right-handed female with Crohn's disease, depression, and anxiety who follows up for evaluation of multiple sclerosis.  She is accompanied by her friend who supplements history.  UPDATE: NMO/AQP4+ antibody was negative.  Her insurance would not cover anti-MOG antibody testing.  HISTORY: She reports history of various neurologic symptoms since her late 65s: - Urinary incontinence: It started about a year ago and has gotten worse.It occurs suddenly, once or twice a month, usually at home and while standing, such as while washing dishes with the water running. It occurs during fatigued oremotional stress as well. She denies increased urinary frequency or dysuria. No perineal numbness. No low back pain. - She reports poor night vision with difficulty seeing while driving at night.She has trouble focusing. Recent eye exam showed poor "refocusing", which would normally be seen in an older adult. Her sister had Fuchs dystrophy but she doesn't have it. - She reports numbness and tingling in her hands when she wakes up in the morning or in the evening after work. - She reports abnormal gait. It feels like her feet are stuck in cement. She has trouble lifting her feet. It causes hip pain. She trips over her feet. It is worse when there is an extreme change in temperature. She started having trouble in her mid-30s. Worse over the past 2 years. She reports a stabbing pain in her hips or knees. She reports previously being diagnosed with a pinched nerve in the back that caused a left foot dropin the 20s.She had PT and injections which was ineffective. No numbness in the legs.  - When she throws a ball, her hand doesn't release immediately. There is a delay until she is able to release the ball. She has similar problems with her left hand. No neck  pain. - She reports fatigued. She needs to take a nap during the day.  Prior sleep study was normal.  One of her twosistershas multiple sclerosis.  Other history: She has Crohn's disease. Earlier this year, she developed a DVT in her right leg. She also was found to have B12 deficiency and is being treated with injections.  01/09/18 LABS: B12 143, Sed rate 18. Hypercoagulable panel unremarkable, including lupus anticoagulant panel, dRVVT mix, beta-2-glycoprotein, cardiolipin antibodies, prothrombin gene mutation, factor V Leiden.  07/22/18:  MRI Cervical & Thoracic spine w/wo contrast:  Multiple T2/STIR hyperintense signal within the spinal cord at C2, C3-C4, C6-C7, T1, T3-T4, T6-T7, and T12 levels, all non-enhancing.  She also demonstrates degenerative spinal stenosis at C4-C5 and C5-C6 with mild spinal cord mass effect. 07/24/18:  MRI Brain w/wo contrast:  Mild hyperintense in the periventricular and juxtacortical white matter with signal running perpendicular from the lateral ventricle and involving the corpus callosum, non-enhancing.  PAST MEDICAL HISTORY: Past Medical History:  Diagnosis Date  . Anemia    related to Crohns flares  . Anxiety   . Arthritis    knees, feet, hands, wrists, related to Crohns flare  . Asthma    Childhood  . B12 deficiency    monitored/treated by Dr. Carlean Purl  . Cervical dysplasia   . Crohn's disease of small intestine ([place]) 06/24/2012   Diagnosed 1999, in [place]. Ileitis only then. Treated with Imuran Remicade and prednisone.  Noncompliant with therapy. 2004 return to care and was treated with Remicade prednisone Pentasa Cipro and Flagyl. 2006 status post right hemicolectomy. Subsequently treated with  azathioprine and Cimzia. 200 mg every other week.  azathioprine was added in 2010. Prometheus TP MT enzyme was negative. Has ha  . Depression   . DVT of upper extremity (deep vein thrombosis) ([place]) 11/28/2017  . Eczema    arms and behind knees,  worse in winter  . History of recurrent UTI (urinary tract infection)   . HPV (human papilloma virus) infection   . Hyperlipemia   . Osteopenia of femur neck T. score -1.4 09/29/2011   09/2013 T-1.6  . Papanicolaou smear 12/12   last abnormal 2011  . Pulmonary embolism ([place]) 11/30/2017  . Scaphoid fracture of wrist 2013   left  . Seasonal allergic rhinitis   . Shingles 09/2015   R hip  . Wears glasses     MEDICATIONS: Current Outpatient Medications on File Prior to Visit  Medication Sig Dispense Refill  . acetaminophen (TYLENOL) 500 MG tablet Take 1,000 mg by mouth every 6 (six) hours as needed for headache (pain). Reported on 12/15/2015    . buPROPion (WELLBUTRIN SR) 100 MG 12 hr tablet TAKE 1 TABLET(100 MG) BY MOUTH TWICE DAILY 180 tablet 1  . cetirizine (ZYRTEC) 10 MG tablet Take 10 mg by mouth at bedtime.     . Cholecalciferol (VITAMIN D3) 2000 units TABS Take 2,000 Units by mouth every evening.     . colestipol (COLESTID) 5 g granules Take 5 g by mouth daily. 300 g 5  . cyanocobalamin (,VITAMIN B-12,) 1000 MCG/ML injection Inject 1000 mcg weekly x 4 then monthly 10 mL 1  . diphenhydrAMINE (BENADRYL) 25 MG tablet Take 25 mg by mouth at bedtime as needed for sleep.    Marland Kitchen ELIQUIS 5 MG TABS tablet TAKE 1 TABLET(5 MG) BY MOUTH TWICE DAILY (Patient taking differently: Take 5 mg by mouth 2 (two) times daily. ) 60 tablet 5  . escitalopram (LEXAPRO) 10 MG tablet Take 1 tablet (10 mg total) by mouth daily. (Patient taking differently: Take 10 mg by mouth at bedtime. ) 90 tablet 1  . fenofibrate 160 MG tablet Take 1 tablet (160 mg total) by mouth daily. (Patient taking differently: Take 160 mg by mouth at bedtime. ) 30 tablet 11  . gabapentin (NEURONTIN) 100 MG capsule Take 3 capsules (300 mg total) by mouth at bedtime. 180 capsule 3  . levonorgestrel (MIRENA) 20 MCG/24HR IUD 1 each by Intrauterine route once. Implanted April 2017    . OVER THE COUNTER MEDICATION Apply 1 application topically  daily as needed (arthritis pain). CBD Oil    . vedolizumab (ENTYVIO) 300 MG injection Inject 300 mg into the vein every 8 (eight) weeks. 1 vial 6   No current facility-administered medications on file prior to visit.     ALLERGIES: Allergies  Allergen Reactions  . Ibuprofen Other (See Comments)    Avoids due to Crohns disease  . Morphine And Related Itching    itchy  . Prednisone Other (See Comments)    "crazy", hallucinations  . Adhesive [Tape] Rash    Rash with electrodes    FAMILY HISTORY: Family History  Problem Relation Age of Onset  . Hypertension Mother   . Breast cancer Mother 86  . Bone cancer Mother        metastatic from breast  . Depression Mother   . Hypothyroidism Mother   . Colon polyps Father   . Diabetes Father        borderline  . Hypertension Father   . Hyperlipidemia Father   . Colon  cancer Paternal Grandfather   . Multiple sclerosis Sister   . Alcohol abuse Sister   . Fuch's dystrophy Sister   . Stroke Maternal Grandmother   . Lung cancer Maternal Grandmother   . Hypertension Maternal Grandmother   . Heart disease Paternal Grandmother   . Hypertension Paternal Grandmother     SOCIAL HISTORY: Social History   Socioeconomic History  . Marital status: Married    Spouse name: Marcello Moores  . Number of children: 0  . Years of education: Not on file  . Highest education level: Associate degree: occupational, Hotel manager, or vocational program  Occupational History  . Occupation: Hydrographic surveyor: [employer]  . Financial resource strain: Not on file  . Food insecurity:    Worry: Not on file    Inability: Not on file  . Transportation needs:    Medical: Not on file    Non-medical: Not on file  Tobacco Use  . Smoking status: Former Smoker    Packs/day: 1.00    Years: 4.00    Pack years: 4.00    Last attempt to quit: 11/18/1998    Years since quitting: 19.8  . Smokeless tobacco: Never Used  Substance and Sexual  Activity  . Alcohol use: No    Alcohol/week: 0.0 standard drinks  . Drug use: No  . Sexual activity: Yes    Partners: Male    Birth control/protection: IUD    Comment: Mirena inserted 01/2016  Lifestyle  . Physical activity:    Days per week: Not on file    Minutes per session: Not on file  . Stress: Not on file  Relationships  . Social connections:    Talks on phone: Not on file    Gets together: Not on file    Attends religious service: Not on file    Active member of club or organization: Not on file    Attends meetings of clubs or organizations: Not on file    Relationship status: Not on file  . Intimate partner violence:    Fear of current or ex partner: Not on file    Emotionally abused: Not on file    Physically abused: Not on file    Forced sexual activity: Not on file  Other Topics Concern  . Not on file  Social History Narrative   The patient is divorced.     Re-married Marcello Moores, partner of 10 years, on 10/10/16. 1 cats, 2 dogs, 1 cat   No children - doesn't want any   [place].   Moved from [place] to [place] in 2013.   Past smoker   No alcohol   1 caffeinated beverages a day   She reports she is compliant with sunscreen given her increased risk of sun damage and skin cancer on azathioprine      Updated 12/15/17    REVIEW OF SYSTEMS: Constitutional: fatigue Eyes: No visual changes, double vision, eye pain Ear, nose and throat: No hearing loss, ear pain, nasal congestion, sore throat Cardiovascular: No chest pain, palpitations Respiratory:  No shortness of breath at rest or with exertion, wheezes GastrointestinaI: No nausea, vomiting, diarrhea, abdominal pain, fecal incontinence Genitourinary:  No dysuria, urinary retention or frequency Musculoskeletal:  Hip pain Integumentary: No rash, pruritus, skin lesions Neurological: as above Psychiatric: No depression, insomnia, anxiety Endocrine:fatigue Hematologic/Lymphatic:  No  purpura, petechiae. Allergic/Immunologic: no itchy/runny eyes, nasal congestion, recent allergic reactions, rashes  PHYSICAL EXAM: Blood pressure 110/76, pulse  81, height 5' 4"  (1.626 m), weight 233 lb (105.7 kg), SpO2 97 %. General: No acute distress.  Patient appears well-groomed.   Head:  Normocephalic/atraumatic Eyes:  Fundi examined but not visualized Neck: supple, no paraspinal tenderness, full range of motion Heart:  Regular rate and rhythm Lungs:  Clear to auscultation bilaterally Back: No paraspinal tenderness Neurological Exam: alert and oriented to person, place, and time. Attention span and concentration intact, recent and remote memory intact, fund of knowledge intact.  Speech fluent and not dysarthric, language intact.  CN II-XII intact. Bulk and tone normal, muscle strength 5/5 throughout.  Sensation to light touch intact.  Deep tendon reflexes 2+ throughout, bilateral Babinski.  Finger to nose testing intact.  Spastic gait.  Timed 25 Foot Walk 8.48 seconds.  Romberg negative  IMPRESSION: Multiple sclerosis Due to extensive spinal cord involvement, my first choice for disease modifying therapy would be Tysabri.  After discussion, patient agrees.  Second choice would be Ocrevus.  She would like to avoid oral medication due to her Crohn's disease.  PLAN: 1.  We will check CBC with differential, CMP, vitamin D level, hepatitis B panel, and JC virus antibody with index.  If JC virus antibody is negative or if it is positive but with a low titer, we will start Tysabri.  Otherwise, we will start Ocrevus. 2.  We will submit for pre-authorization for Ampyra to help with her gait difficulty. 3.  Urinary incontinence is controlled with [place] exercises.  Therefore, we will hold off on oxybutynin. 4.  She spoke with our social worker, Janett Billow, who discussed the diagnosis and provided resources such as support groups. 5.  She will follow up in 6 months.  25 minutes spent with the patient,  over 50% spent discussing diagnosis and treatment options.  Metta Clines, DO  CC: Rita Ohara, MD

## 2018-09-12 ENCOUNTER — Encounter: Payer: Self-pay | Admitting: Neurology

## 2018-09-12 ENCOUNTER — Encounter: Payer: Self-pay | Admitting: Psychology

## 2018-09-12 ENCOUNTER — Other Ambulatory Visit (INDEPENDENT_AMBULATORY_CARE_PROVIDER_SITE_OTHER): Payer: 59

## 2018-09-12 ENCOUNTER — Ambulatory Visit (INDEPENDENT_AMBULATORY_CARE_PROVIDER_SITE_OTHER): Payer: 59 | Admitting: Neurology

## 2018-09-12 VITALS — BP 110/76 | HR 81 | Ht 64.0 in | Wt 233.0 lb

## 2018-09-12 DIAGNOSIS — Z79899 Other long term (current) drug therapy: Secondary | ICD-10-CM | POA: Diagnosis not present

## 2018-09-12 DIAGNOSIS — G35 Multiple sclerosis: Secondary | ICD-10-CM

## 2018-09-12 LAB — COMPREHENSIVE METABOLIC PANEL
ALT: 22 U/L (ref 0–35)
AST: 19 U/L (ref 0–37)
Albumin: 3.9 g/dL (ref 3.5–5.2)
Alkaline Phosphatase: 42 U/L (ref 39–117)
BUN: 19 mg/dL (ref 6–23)
CO2: 27 meq/L (ref 19–32)
Calcium: 9 mg/dL (ref 8.4–10.5)
Chloride: 103 mEq/L (ref 96–112)
Creatinine, Ser: 0.88 mg/dL (ref 0.40–1.20)
GFR: 75.39 mL/min (ref >60.00)
GLUCOSE: 88 mg/dL (ref 70–99)
Potassium: 4.1 mEq/L (ref 3.5–5.1)
SODIUM: 137 meq/L (ref 135–145)
Total Bilirubin: 0.2 mg/dL (ref 0.2–1.2)
Total Protein: 6.9 g/dL (ref 6.0–8.3)

## 2018-09-12 LAB — CBC WITH DIFFERENTIAL/PLATELET
BASOS ABS: 0 10*3/uL (ref 0.0–0.1)
Basophils Relative: 0.3 % (ref 0.0–3.0)
EOS PCT: 0.6 % (ref 0.0–5.0)
Eosinophils Absolute: 0 10*3/uL (ref 0.0–0.7)
HEMATOCRIT: 36.6 % (ref 36.0–46.0)
Hemoglobin: 12 g/dL (ref 12.0–15.0)
LYMPHS PCT: 14.8 % (ref 12.0–46.0)
Lymphs Abs: 1.2 10*3/uL (ref 0.7–4.0)
MCHC: 32.7 g/dL (ref 30.0–36.0)
MCV: 89.4 fl (ref 78.0–100.0)
MONOS PCT: 10 % (ref 3.0–12.0)
Monocytes Absolute: 0.8 10*3/uL (ref 0.1–1.0)
NEUTROS ABS: 6.2 10*3/uL (ref 1.4–7.7)
Neutrophils Relative %: 74.3 % (ref 43.0–77.0)
PLATELETS: 419 10*3/uL — AB (ref 150.0–400.0)
RBC: 4.09 Mil/uL (ref 3.87–5.11)
RDW: 19.9 % — ABNORMAL HIGH (ref 11.5–15.5)
WBC: 8.3 10*3/uL (ref 4.0–10.5)

## 2018-09-12 LAB — HEPATIC FUNCTION PANEL
ALT: 22 U/L (ref 0–35)
AST: 19 U/L (ref 0–37)
Albumin: 3.9 g/dL (ref 3.5–5.2)
Alkaline Phosphatase: 42 U/L (ref 39–117)
BILIRUBIN DIRECT: 0.1 mg/dL (ref 0.0–0.3)
BILIRUBIN TOTAL: 0.2 mg/dL (ref 0.2–1.2)
TOTAL PROTEIN: 6.9 g/dL (ref 6.0–8.3)

## 2018-09-12 MED ORDER — DALFAMPRIDINE ER 10 MG PO TB12: 10.0000 mg | ORAL_TABLET | Freq: Two times a day (BID) | ORAL | 6 refills | Status: DC

## 2018-09-12 NOTE — Progress Notes (Signed)
I met with the patient briefly while she was in the office today.  We reviewed resources that would be appropriate for being newly diagnosed with multiple sclerosis.  In addition, she was interested in receiving information on support groups.  I recommended the MS BS support group out of [facility] as they work pretty well with newly diagnosed people and are a more positive group.  In addition, I suggested that she get connected with the [place] and also [facility].   Patient has a sister with multiple sclerosis so she is aware somewhat of resources.  In addition, the patient reports that she has Crohn's disease and has had to learn to cope with that chronic disease in the past.

## 2018-09-12 NOTE — Patient Instructions (Addendum)
1.  We will check CBC with diff, CMP, vitamin D level, hepatitis B panel, and JC Virus antibody with index.  If JC VIrus antibody negative, we will start Tysabri.  Otherwise, we will start Ocrevus.  2.  We will try to get preauthorization for Ampyra to help with your walking. 3.  Follow up in 6 months.  Your provider has requested that you have labwork completed today. Please go to [facility] ([phone]) on the second floor of this building before leaving the office today. You do not need to check in. If you are not called within 15 minutes please check with the front desk.

## 2018-09-13 ENCOUNTER — Telehealth: Payer: Self-pay

## 2018-09-13 DIAGNOSIS — E559 Vitamin D deficiency, unspecified: Secondary | ICD-10-CM

## 2018-09-13 LAB — VITAMIN D 25 HYDROXY (VIT D DEFICIENCY, FRACTURES): Vit D, 25-Hydroxy: 21 ng/mL — ABNORMAL LOW (ref 30–100)

## 2018-09-13 NOTE — Telephone Encounter (Signed)
-----   Message from Pieter Partridge, DO sent at 09/13/2018  6:28 AM EST ----- Vitamin D level is still low.  I believe she is taking D3 2000  IU daily.  I would like her to increase dose to 4000 IU daily and we will repeat level in 6 months (prior to her follow up).  Her platelet count is slightly elevated which does not appear to be new.  Otherwise, labs are unremarkable.

## 2018-09-13 NOTE — Telephone Encounter (Signed)
Pt returned call, advised her to increase Vit D3 to 4000 IU daily and to have repeat labs in 6 months

## 2018-09-13 NOTE — Addendum Note (Signed)
Addended by: Clois Comber on: 09/13/2018 11:49 AM   Modules accepted: Orders

## 2018-09-13 NOTE — Telephone Encounter (Signed)
Called and LMOVM for Pt to return my call

## 2018-09-14 NOTE — Progress Notes (Addendum)
submitted on cover my meds  Rcvd approval [id number] thru 03/15/19

## 2018-09-18 LAB — STRATIFY JCV(TM) AB W/INDEX
JCV ANTIBODY: POSITIVE — AB
JCV Index Value: 3.26

## 2018-09-22 ENCOUNTER — Telehealth: Payer: Self-pay

## 2018-09-22 DIAGNOSIS — Z79899 Other long term (current) drug therapy: Secondary | ICD-10-CM

## 2018-09-22 NOTE — Telephone Encounter (Signed)
-----   Message from Pieter Partridge, DO sent at 09/18/2018 12:24 PM EST ----- JC Virus antibody is positive with a high titer.  Therefore, I do not recommend Tysabri.  We will start [place].  We have to check a hepatitis B panel (and the outpatient infusion center requires TB test as well).

## 2018-09-22 NOTE — Telephone Encounter (Signed)
-----   Message from Pieter Partridge, DO sent at 09/18/2018 12:24 PM EST ----- JC Virus antibody is positive with a high titer.  Therefore, I do not recommend Tysabri.  We will start [place].  We have to check a hepatitis B panel (and the outpatient infusion center requires TB test as well).

## 2018-09-22 NOTE — Telephone Encounter (Signed)
Called and spoke with Pt, advised of lab results, she will come Monday for labs

## 2018-09-25 ENCOUNTER — Other Ambulatory Visit: Payer: 59

## 2018-09-25 DIAGNOSIS — Z79899 Other long term (current) drug therapy: Secondary | ICD-10-CM

## 2018-09-27 ENCOUNTER — Other Ambulatory Visit: Payer: Self-pay | Admitting: Family Medicine

## 2018-09-27 DIAGNOSIS — F325 Major depressive disorder, single episode, in full remission: Secondary | ICD-10-CM

## 2018-09-27 LAB — HEPATITIS B CORE ANTIBODY, IGM: Hep B C IgM: NONREACTIVE

## 2018-09-27 LAB — QUANTIFERON-TB GOLD PLUS
NIL: 0.02 IU/mL
QuantiFERON-TB Gold Plus: NEGATIVE
TB1-NIL: 0.03 [IU]/mL
TB2-NIL: 0.05 IU/mL

## 2018-10-05 ENCOUNTER — Telehealth: Payer: Self-pay

## 2018-10-05 ENCOUNTER — Other Ambulatory Visit: Payer: Self-pay

## 2018-10-05 DIAGNOSIS — G35 Multiple sclerosis: Secondary | ICD-10-CM

## 2018-10-05 MED ORDER — DALFAMPRIDINE ER 10 MG PO TB12: 10.0000 mg | ORAL_TABLET | Freq: Two times a day (BID) | ORAL | 3 refills | Status: DC

## 2018-10-05 NOTE — Progress Notes (Signed)
Routing to [place], [place] as [employer]

## 2018-10-05 NOTE — Telephone Encounter (Signed)
Rcvd denial from [place] for [place], called [phone], spoke with Gerda Diss, she said it was not a covered benefit under the pharmacy side and to call the medical [phone]

## 2018-10-05 NOTE — Progress Notes (Addendum)
Received notice from [facility] that pt's   Ocrevus has been denied  PA reference # [id number]

## 2018-10-05 NOTE — Progress Notes (Signed)
Initiated on cover my meds

## 2018-10-06 NOTE — Telephone Encounter (Signed)
[place], [phone], spoke with Lindsay Mccarthy, she said I would need to speak with a specialist to expedite, transferred me to [place], but said I needed to speak with a pharmacist, transferred me to [place], she took verbal and is to expedite

## 2018-10-09 ENCOUNTER — Ambulatory Visit: Payer: 59 | Admitting: Neurology

## 2018-10-09 ENCOUNTER — Encounter

## 2018-10-11 ENCOUNTER — Encounter: Payer: Self-pay | Admitting: Family Medicine

## 2018-10-23 ENCOUNTER — Encounter: Payer: Self-pay | Admitting: Family Medicine

## 2018-10-24 ENCOUNTER — Ambulatory Visit: Payer: 59 | Admitting: Neurology

## 2018-10-30 ENCOUNTER — Encounter: Payer: Self-pay | Admitting: Family Medicine

## 2018-10-30 ENCOUNTER — Ambulatory Visit (INDEPENDENT_AMBULATORY_CARE_PROVIDER_SITE_OTHER): Payer: 59 | Admitting: Family Medicine

## 2018-10-30 VITALS — BP 130/84 | HR 88 | Temp 99.0°F | Ht 64.0 in | Wt 231.0 lb

## 2018-10-30 DIAGNOSIS — J019 Acute sinusitis, unspecified: Secondary | ICD-10-CM | POA: Diagnosis not present

## 2018-10-30 MED ORDER — AMOXICILLIN 500 MG PO TABS: 1000.0000 mg | ORAL_TABLET | Freq: Two times a day (BID) | ORAL | 0 refills | Status: DC

## 2018-10-30 NOTE — Progress Notes (Signed)
Chief Complaint  Patient presents with  . Cough    started 12/22 as a cold, did feel better by that Fri. Then began worsening again. Had a slight fever 12/22 and 12/24. Whole left side of her head has lots of pain and pressure and her ear hurts. Mucus is yellow to greenish in color. No chills or body aches this week but did have some last week.    She started with URI symptoms on 12/22. 12/27 she felt okay, but on the 28th she developed, left ear pain.  Left face and head pain started this morning.  Discolored mucus has been since Christmas, got lighter, but recently got darker again.  Started feeling dizzy about 2 days ago--getting dizzy with bending, standing Lightheaded when she coughs, more vertigo sensation with movements.  No nausea, vomiting. Bowels are at baseline (colestipol helps prevent accidents).  No bleeding/bruising.  She is on treatment for MS, and reports that her gait has improved.  PMH, PSH, SH reviewed  Outpatient Encounter Medications as of 10/30/2018  Medication Sig Note  . buPROPion (WELLBUTRIN SR) 100 MG 12 hr tablet TAKE 1 TABLET(100 MG) BY MOUTH TWICE DAILY   . cetirizine (ZYRTEC) 10 MG tablet Take 10 mg by mouth at bedtime.    . Cholecalciferol (VITAMIN D3) 2000 units TABS Take 3,000 Units by mouth every evening.    . colestipol (COLESTID) 5 g granules Take 5 g by mouth daily.   . cyanocobalamin (,VITAMIN B-12,) 1000 MCG/ML injection Inject 1000 mcg weekly x 4 then monthly   . dalfampridine (AMPYRA) 10 MG TB12 Take 1 tablet (10 mg total) by mouth 2 (two) times daily.   Marland Kitchen ELIQUIS 5 MG TABS tablet TAKE 1 TABLET(5 MG) BY MOUTH TWICE DAILY (Patient taking differently: Take 5 mg by mouth 2 (two) times daily. )   . escitalopram (LEXAPRO) 10 MG tablet Take 1 tablet (10 mg total) by mouth at bedtime.   . fenofibrate 160 MG tablet Take 1 tablet (160 mg total) by mouth daily. (Patient taking differently: Take 160 mg by mouth at bedtime. )   . gabapentin (NEURONTIN) 100 MG  capsule Take 3 capsules (300 mg total) by mouth at bedtime.   Marland Kitchen levonorgestrel (MIRENA) 20 MCG/24HR IUD 1 each by Intrauterine route once. Implanted April 2017   . Pseudoephedrine-APAP-DM (DAYQUIL PO) Take 2 tablets by mouth as needed. 10/30/2018: Last dose 6am  . vedolizumab (ENTYVIO) 300 MG injection Inject 300 mg into the vein every 8 (eight) weeks.   Marland Kitchen acetaminophen (TYLENOL) 500 MG tablet Take 1,000 mg by mouth every 6 (six) hours as needed for headache (pain). Reported on 12/15/2015   . diphenhydrAMINE (BENADRYL) 25 MG tablet Take 25 mg by mouth at bedtime as needed for sleep.   Marland Kitchen OVER THE COUNTER MEDICATION Apply 1 application topically daily as needed (arthritis pain). CBD Oil   . [DISCONTINUED] dalfampridine (AMPYRA) 10 MG TB12 Take 1 tablet (10 mg total) by mouth 2 (two) times daily.    No facility-administered encounter medications on file as of 10/30/2018.     Allergies  Allergen Reactions  . Ibuprofen Other (See Comments)    Avoids due to Crohns disease  . Morphine And Related Itching    itchy  . Prednisone Other (See Comments)    "crazy", hallucinations  . Adhesive [Tape] Rash    Rash with electrodes   ROS: see HPI re: URI symptoms. No urinary complaints. No bleeding, bruising, rashes ampyra has helped with her gait, gabapentin helps  with the "pings" in her legs. No chest pain, palpitations, shortness of breath   PHYSICAL EXAM:  BP 130/84   Pulse 88   Temp 99 F (37.2 C) (Tympanic)   Ht 5' 4"  (1.626 m)   Wt 231 lb (104.8 kg)   BMI 39.65 kg/m   Well-appearing, pleasant female, with intermittent coughing, in no distress HEENT: PERRL, EOMI, conjunctiva and sclera are clear. Nose: Mod edema, with clear mucus on the right, yellow crusting on the left TM's and EAC's normal. +sinus tenderness over L>R frontal and L maxillary sinuses. OP clear Neck: no lymphadenopathy or mass Heart: regular rate and rhythm Lungs: clear bilaterally, no wheezes, rales,  ronchi Extremities: no edema Skin: normal turgor, no rash Neuro: alert and oriented, cranial nerves intact. Gait improved Psych: normal mood, affect, hygiene and grooming  ASSESSMENT/PLAN:  Acute non-recurrent sinusitis, unspecified location - Plan: amoxicillin (AMOXIL) 500 MG tablet   Drink plenty of water. Consider sinus rinses (neti-pot or sinus rinse kit). Continue Mucinex and use decongestants as needed for sinus pressure/pain. Take the antibiotics as directed, and contact us in 5-7 days if you are not noticing any improvement, sooner if clearly getting worse. Also contact us at the end of the course if you aren't completely better.

## 2018-10-30 NOTE — Patient Instructions (Signed)
  Drink plenty of water. Consider sinus rinses (neti-pot or sinus rinse kit). Continue Mucinex and use decongestants as needed for sinus pressure/pain. Take the antibiotics as directed, and contact us in 5-7 days if you are not noticing any improvement, sooner if clearly getting worse. Also contact us at the end of the course if you aren't completely better.

## 2018-11-07 ENCOUNTER — Telehealth: Payer: Self-pay

## 2018-11-07 NOTE — Telephone Encounter (Signed)
Rcvd an after hours phone message from [place] requesting call back. Called and spoke with Nira Conn, who said I needed to speak with a pharmacist. Nira Conn remained on the line and brought [place] [facility] on, he said they have the Rx, but does need PA. I advised him I have attempted this on multiple occasions and have been unsuccessful. Heather and I continued. She attempted to call the PA team I was instructed to call and she too was unable to get through. She advised me to try [phone] PA depart or [phone] med. billing team. I called PA dept, spoke with Charleen, she placed me on hold for 25 minutes while she spoke with 3 different rep's, she brought Ilona Sorrel on the line, who also placed me hold for over 20 minutes. He came on the line, stating the PA ending in [phone] was denied and I would need to speak with the benefits determination team. He could not tell me why it was denied. I asked to speak with someone who could. I spoke with Melanie T. Who informed me I would still need to call another number [phone]

## 2018-11-08 NOTE — Telephone Encounter (Signed)
Rcvd call from [place] with [employer] concerning [place]. She wanted to know if I had reached the appropriated person at [phone] option #3. I went over the history of what I have encountered trying to get this authorized because the Pt is an employee of [employer], only certain people are allowed access to her records. Felicia tried to start a new PA for me, apparently one of the 8 rep's I spoke with yesterday actually started a new PA and is now pending #[id number]. I will receive a call should any further information be needed.

## 2018-11-15 NOTE — Telephone Encounter (Signed)
Rcvd fax from [place] need for submitting PA Gave number to call [place] [phone] Called and spoke with [facility]. I asked her to check on pending PA, she said it is still pending. I asked how much longer it may take, Lindsay Mccarthy stated the clinicals were not uploaded until 10/17/18, and it can take up to 15 business days.  I thanked her for the information, no one else has advised me that information was rcvd.

## 2018-11-16 NOTE — Telephone Encounter (Signed)
Called to check status [phone], spoke with Seth Bake. I explained to her the time elapsed and asked if she could research why it has taken longer than the quoted 15 business days for a determination.  Seth Bake went to a Freight forwarder, and confirmed that the pending PA was in a kind of "limbo" she took my direct number. She is placing a ticket with her IT dept and is to call me back directly tomorrow when the PA has been released. She is to expedite to Licensed conveyancer for review.

## 2018-11-17 ENCOUNTER — Telehealth: Payer: Self-pay

## 2018-11-17 NOTE — Telephone Encounter (Signed)
Rcvd call from Seth Bake at [facility]. PA was pushed through.New PA # [id number]. Can check status at [facility] [email or web address] or call

## 2018-11-23 ENCOUNTER — Other Ambulatory Visit (HOSPITAL_COMMUNITY): Payer: Self-pay | Admitting: General Practice

## 2018-11-23 ENCOUNTER — Other Ambulatory Visit: Payer: Self-pay

## 2018-11-24 ENCOUNTER — Telehealth: Payer: Self-pay | Admitting: Internal Medicine

## 2018-11-24 MED ORDER — VEDOLIZUMAB 300 MG IV SOLR: INTRAVENOUS | 4 refills | Status: DC

## 2018-11-24 NOTE — Telephone Encounter (Signed)
rx printed and faxed as requested

## 2018-11-24 NOTE — Telephone Encounter (Signed)
[facility] calling requesting prescription for entyvio faxed to  [phone].

## 2018-11-26 ENCOUNTER — Other Ambulatory Visit: Payer: Self-pay | Admitting: Internal Medicine

## 2018-11-26 ENCOUNTER — Other Ambulatory Visit: Payer: Self-pay | Admitting: Family Medicine

## 2018-11-26 DIAGNOSIS — E781 Pure hyperglyceridemia: Secondary | ICD-10-CM

## 2018-11-27 ENCOUNTER — Other Ambulatory Visit (HOSPITAL_COMMUNITY): Payer: Self-pay | Admitting: General Practice

## 2018-11-27 ENCOUNTER — Other Ambulatory Visit: Payer: Self-pay

## 2018-12-01 ENCOUNTER — Telehealth: Payer: Self-pay | Admitting: Neurology

## 2018-12-01 NOTE — Telephone Encounter (Signed)
Amber from [employer] is calling in about a PA for this patient for Ocrevis.  please call her back at [phone]. Thanks!

## 2018-12-04 ENCOUNTER — Other Ambulatory Visit: Payer: Self-pay

## 2018-12-04 MED ORDER — DALFAMPRIDINE ER 10 MG PO TB12: 10.0000 mg | ORAL_TABLET | Freq: Two times a day (BID) | ORAL | 3 refills | Status: DC

## 2018-12-05 ENCOUNTER — Telehealth: Payer: Self-pay

## 2018-12-05 NOTE — Telephone Encounter (Signed)
Rcvd call from Merrilee Seashore at [facility]. He is the head pharmacist.The authorization I was given is through the pharmacy benefits, not the medical benefits. It has to be through the medical benefits. I explained to him the lengths I have gone trying to get this approved for the Pt, the many calls and [facility] representatives I spoken with. He has an Psychologist, forensic who is working on this also. I spoke with her yesterday and provided her with the auth I have, which apparently is through the pharmacy benefits. I was advised back in December it was not covered under the pharmacy benefits and rcvd a denial stating such. I calleded [employer] at the number provided to me by  Merrilee Seashore [phone] and start another PA, I spoke with Trilby Drummer. He kept trying to transfer me to the pharmacy. I asked to speak to someone else, he transferred me and no one ever came to the line. I had to hang up after 22 minutes. Will try again tomorrow.

## 2018-12-07 ENCOUNTER — Telehealth: Payer: Self-pay | Admitting: *Deleted

## 2018-12-07 ENCOUNTER — Encounter: Payer: Self-pay | Admitting: Family Medicine

## 2018-12-07 DIAGNOSIS — R7989 Other specified abnormal findings of blood chemistry: Secondary | ICD-10-CM

## 2018-12-07 DIAGNOSIS — E781 Pure hyperglyceridemia: Secondary | ICD-10-CM

## 2018-12-07 DIAGNOSIS — Z5181 Encounter for therapeutic drug level monitoring: Secondary | ICD-10-CM

## 2018-12-07 DIAGNOSIS — D500 Iron deficiency anemia secondary to blood loss (chronic): Secondary | ICD-10-CM

## 2018-12-07 DIAGNOSIS — E538 Deficiency of other specified B group vitamins: Secondary | ICD-10-CM

## 2018-12-07 NOTE — Telephone Encounter (Signed)
Cbc, c-met, B12, ferritin, lipid

## 2018-12-07 NOTE — Telephone Encounter (Signed)
Patient would like to com ein Feb 14th for labs for her CPE on the 19th, can you please order or tell me which labs and I will order, thanks.

## 2018-12-11 ENCOUNTER — Telehealth (HOSPITAL_COMMUNITY): Payer: Self-pay | Admitting: General Practice

## 2018-12-15 ENCOUNTER — Encounter (HOSPITAL_COMMUNITY)
Admission: RE | Admit: 2018-12-15 | Discharge: 2018-12-15 | Disposition: A | Discharge status: Home / Self Care | Payer: 59 | Source: Ambulatory Visit | Attending: Neurology | Admitting: Neurology

## 2018-12-15 ENCOUNTER — Encounter (HOSPITAL_COMMUNITY): Payer: Self-pay

## 2018-12-15 ENCOUNTER — Other Ambulatory Visit: Payer: 59

## 2018-12-15 DIAGNOSIS — D509 Iron deficiency anemia, unspecified: Secondary | ICD-10-CM | POA: Insufficient documentation

## 2018-12-15 DIAGNOSIS — E781 Pure hyperglyceridemia: Secondary | ICD-10-CM

## 2018-12-15 DIAGNOSIS — E538 Deficiency of other specified B group vitamins: Secondary | ICD-10-CM

## 2018-12-15 DIAGNOSIS — Z5181 Encounter for therapeutic drug level monitoring: Secondary | ICD-10-CM

## 2018-12-15 DIAGNOSIS — R7989 Other specified abnormal findings of blood chemistry: Secondary | ICD-10-CM

## 2018-12-15 DIAGNOSIS — D500 Iron deficiency anemia secondary to blood loss (chronic): Secondary | ICD-10-CM

## 2018-12-15 HISTORY — DX: Myoneural disorder, unspecified: G70.9

## 2018-12-15 HISTORY — DX: Multiple sclerosis: G35

## 2018-12-15 HISTORY — DX: Multiple sclerosis, unspecified: G35.D

## 2018-12-15 MED ORDER — SODIUM CHLORIDE 0.9 % IV SOLN
INTRAVENOUS | Status: DC
  Administered 2018-12-15: 10:00:00 via INTRAVENOUS

## 2018-12-15 MED ORDER — SODIUM CHLORIDE 0.9 % IV SOLN
300.0000 mg | Freq: Once | INTRAVENOUS | Status: AC
  Administered 2018-12-15: 300 mg via INTRAVENOUS
  Filled 2018-12-15: qty 10

## 2018-12-15 MED ORDER — ACETAMINOPHEN 325 MG PO TABS
650.0000 mg | ORAL_TABLET | Freq: Once | ORAL | Status: AC
  Administered 2018-12-15: 650 mg via ORAL
  Filled 2018-12-15: qty 2

## 2018-12-15 MED ORDER — METHYLPREDNISOLONE SODIUM SUCC 125 MG IJ SOLR
100.0000 mg | Freq: Once | INTRAMUSCULAR | Status: AC
  Administered 2018-12-15: 100 mg via INTRAVENOUS
  Filled 2018-12-15: qty 2

## 2018-12-15 MED ORDER — DIPHENHYDRAMINE HCL 50 MG/ML IJ SOLN
25.0000 mg | Freq: Once | INTRAMUSCULAR | Status: AC
  Administered 2018-12-15: 25 mg via INTRAVENOUS
  Filled 2018-12-15: qty 1

## 2018-12-15 NOTE — Progress Notes (Addendum)
At the 1220 rate increase, pt states "when you increased the medicine 30 minutes ago, my ears starting getting itchy inside my ear."  Informed pt we would proceed with increase and if the itchy ears increased or another symptom developed she was to push her call bell.  Pt voiced understanding.  All vital signs have remained stable, afebrile, no changes.  At her next rate increase at 1250, pt states the itchy ears did not worsen or change and she presents with no other symptoms.  The Ocrevus was complete at 1305.  Will observe pt for 1 hour as mandated by protocol.  Pt awake and alert, watching tv.  NS infusing now at 54m/hr.

## 2018-12-15 NOTE — Discharge Instructions (Signed)
Ocrelizumab injection What is this medicine? OCRELIZUMAB (ok re LIZ ue mab) treats multiple sclerosis. It helps to decrease the number of multiple sclerosis relapses. It is not a cure. This medicine may be used for other purposes; ask your health care provider or pharmacist if you have questions. COMMON BRAND NAME(S): OCREVUS What should I tell my health care provider before I take this medicine? They need to know if you have any of these conditions: -cancer -hepatitis B infection -other infection (especially a virus infection such as chickenpox, cold sores, or herpes) -an unusual or allergic reaction to ocrelizumab, other medicines, foods, dyes or preservatives -pregnant or trying to get pregnant -breast-feeding How should I use this medicine? This medicine is for infusion into a vein. It is given by a health care professional in a hospital or clinic setting. Talk to your pediatrician regarding the use of this medicine in children. Special care may be needed. Overdosage: If you think you have taken too much of this medicine contact a poison control center or emergency room at once. NOTE: This medicine is only for you. Do not share this medicine with others. What if I miss a dose? Keep appointments for follow-up doses as directed. It is important not to miss your dose. Call your doctor or health care professional if you are unable to keep an appointment. What may interact with this medicine? -alemtuzumab -daclizumab -dimethyl fumarate -fingolimod -glatiramer -interferon beta -live virus vaccines -mitoxantrone -natalizumab -peginterferon beta -rituximab -steroid medicines like prednisone or cortisone -teriflunomide This list may not describe all possible interactions. Give your health care provider a list of all the medicines, herbs, non-prescription drugs, or dietary supplements you use. Also tell them if you smoke, drink alcohol, or use illegal drugs. Some items may interact with  your medicine. What should I watch for while using this medicine? Tell your doctor or healthcare professional if your symptoms do not start to get better or if they get worse. This medicine can cause serious allergic reactions. To reduce your risk you may need to take medicine before treatment with this medicine. Take your medicine as directed. Women should inform their doctor if they wish to become pregnant or think they might be pregnant. There is a potential for serious side effects to an unborn child. Talk to your health care professional or pharmacist for more information. Female patients should use effective birth control methods while receiving this medicine and for 6 months after the last dose. Call your doctor or health care professional for advice if you get a fever, chills or sore throat, or other symptoms of a cold or flu. Do not treat yourself. This drug decreases your body's ability to fight infections. Try to avoid being around people who are sick. If you have a hepatitis B infection or a history of a hepatitis B infection, talk to your doctor. The symptoms of hepatitis B may get worse if you take this medicine. In some patients, this medicine may cause a serious brain infection that may cause death. If you have any problems seeing, thinking, speaking, walking, or standing, tell your doctor right away. If you cannot reach your doctor, urgently seek other source of medical care. This medicine can decrease the response to a vaccine. If you need to get vaccinated, tell your healthcare professional if you have received this medicine. Extra booster doses may be needed. Talk to your doctor to see if a different vaccination schedule is needed. Talk to your doctor about your risk of cancer.   You may be more at risk for certain types of cancers if you take this medicine. What side effects may I notice from receiving this medicine? Side effects that you should report to your doctor or health care  professional as soon as possible: -allergic reactions like skin rash, itching or hives, swelling of the face, lips, or tongue -breathing problems -facial flushing -fast, irregular heartbeat -lump or soreness in the breast -signs and symptoms of herpes such as cold sore, shingles, or genital sores -signs and symptoms of infection like fever or chills, cough, sore throat, pain or trouble passing urine -signs and symptoms of low blood pressure like dizziness; feeling faint or lightheaded, falls; unusually weak or tired -signs and symptoms of progressive multifocal leukoencephalopathy (PML) like changes in vision; clumsiness; confusion; personality changes; weakness on one side of the body -swelling of the ankles, feet, hands Side effects that usually do not require medical attention (report these to your doctor or health care professional if they continue or are bothersome): -back pain -depressed mood -diarrhea -pain, redness, or irritation at site where injected This list may not describe all possible side effects. Call your doctor for medical advice about side effects. You may report side effects to FDA at 1-800-FDA-1088. Where should I keep my medicine? This drug is given in a hospital or clinic and will not be stored at home. NOTE: This sheet is a summary. It may not cover all possible information. If you have questions about this medicine, talk to your doctor, pharmacist, or health care provider.  2019 [employer] Standard (2016-02-03 09:40:25)  

## 2018-12-15 NOTE — Progress Notes (Signed)
Ocrevus was complete at 1305.  Pt states ear Itching (inside ear) was completely resolved about 10 minutes after med was done.  No other symptoms reported. Vital signs remain stable, afebrile, no change.  Pt completed her 1 hour observation and was d/c to lobby ambulatory.

## 2018-12-16 LAB — FERRITIN: Ferritin: 6 ng/mL — ABNORMAL LOW (ref 15–150)

## 2018-12-16 LAB — CBC WITH DIFFERENTIAL/PLATELET
BASOS: 1 %
Basophils Absolute: 0 10*3/uL (ref 0.0–0.2)
EOS (ABSOLUTE): 0.1 10*3/uL (ref 0.0–0.4)
Eos: 1 %
Hematocrit: 33.8 % — ABNORMAL LOW (ref 34.0–46.6)
Hemoglobin: 10.8 g/dL — ABNORMAL LOW (ref 11.1–15.9)
Immature Grans (Abs): 0 10*3/uL (ref 0.0–0.1)
Immature Granulocytes: 0 %
Lymphocytes Absolute: 1.2 10*3/uL (ref 0.7–3.1)
Lymphs: 19 %
MCH: 26.3 pg — AB (ref 26.6–33.0)
MCHC: 32 g/dL (ref 31.5–35.7)
MCV: 82 fL (ref 79–97)
Monocytes Absolute: 0.7 10*3/uL (ref 0.1–0.9)
Monocytes: 10 %
NEUTROS PCT: 69 %
Neutrophils Absolute: 4.5 10*3/uL (ref 1.4–7.0)
Platelets: 489 10*3/uL — ABNORMAL HIGH (ref 150–450)
RBC: 4.11 x10E6/uL (ref 3.77–5.28)
RDW: 13.2 % (ref 11.7–15.4)
WBC: 6.5 10*3/uL (ref 3.4–10.8)

## 2018-12-16 LAB — COMPREHENSIVE METABOLIC PANEL
ALBUMIN: 3.9 g/dL (ref 3.8–4.8)
ALK PHOS: 64 IU/L (ref 39–117)
ALT: 41 IU/L — ABNORMAL HIGH (ref 0–32)
AST: 31 IU/L (ref 0–40)
Albumin/Globulin Ratio: 1.3 (ref 1.2–2.2)
BUN/Creatinine Ratio: 15 (ref 9–23)
BUN: 15 mg/dL (ref 6–24)
Bilirubin Total: <0.2 mg/dL (ref 0.0–1.2)
CO2: 21 mmol/L (ref 20–29)
CREATININE: 1.01 mg/dL — AB (ref 0.57–1.00)
Calcium: 8.9 mg/dL (ref 8.7–10.2)
Chloride: 105 mmol/L (ref 96–106)
GFR calc Af Amer: 80 mL/min/{1.73_m2} (ref >59)
GFR calc non Af Amer: 70 mL/min/{1.73_m2} (ref >59)
Globulin, Total: 3 g/dL (ref 1.5–4.5)
Glucose: 80 mg/dL (ref 65–99)
Potassium: 4.5 mmol/L (ref 3.5–5.2)
Sodium: 140 mmol/L (ref 134–144)
Total Protein: 6.9 g/dL (ref 6.0–8.5)

## 2018-12-16 LAB — LIPID PANEL
CHOLESTEROL TOTAL: 160 mg/dL (ref 100–199)
Chol/HDL Ratio: 2.8 ratio (ref 0.0–4.4)
HDL: 57 mg/dL (ref >39)
LDL Calculated: 62 mg/dL (ref 0–99)
Triglycerides: 203 mg/dL — ABNORMAL HIGH (ref 0–149)
VLDL Cholesterol Cal: 41 mg/dL — ABNORMAL HIGH (ref 5–40)

## 2018-12-16 LAB — VITAMIN B12: Vitamin B-12: 366 pg/mL (ref 232–1245)

## 2018-12-19 ENCOUNTER — Other Ambulatory Visit: Payer: Self-pay | Admitting: Family Medicine

## 2018-12-19 DIAGNOSIS — Z1231 Encounter for screening mammogram for malignant neoplasm of breast: Secondary | ICD-10-CM

## 2018-12-19 DIAGNOSIS — G35 Multiple sclerosis: Secondary | ICD-10-CM | POA: Insufficient documentation

## 2018-12-19 NOTE — Progress Notes (Signed)
Chief Complaint  Patient presents with  . Annual Exam    nonfasting annual exam, no pap-sees Dr.Silva. Sees eye doctor regularly. No new concerns today.   . Flu Vaccine    did not get a flu shot this season-she is about to start Ocrevus infusion and she thinks there may a contraindication but not sure.     Lindsay Mccarthy is a 43 y.o. female who presents for a complete physical.    Since her last physical exam, she was diagnosed with MS, with significant spinal involvement.  She is currently on treatment with Ampyra.  She is under the care of Dr. Tomi Likens. That has helped a lot with her walking, and has increased energy. Just started Ocrevus infusion 2/14, gets another 2/28 and then q6 months. She was also started on gabapentin 349m qHS, to help with the "muscle pings" that would bother her at night. This does help, but if a very physical day or overtired, she notices more restless legs. Urinary incontinence and gait difficulty had been her most concerning symptoms prior to treatment.  As stated above, gait has improved.  No recent issues with incontinence, which she thinks may also be related to stress, which hasn't been bad.  Vitamin D deficiency--found to be low at 21 in 09/2018, checked by Dr. JTomi Likens  She was advised to increase supplement to 4000 IU daily and recheck is due in May. She thinks she is taking 6000 IU daily, but will double check the dose.  CT abdomen performed in 07/2018 showed 857mLLL pulm nodule; Noncontrast CT was recommended in 6-12 to follow up on this nodule.  No change was noted compared to 12/2017 study.  She recalls having h/o pulmonary nodule in the past that was monitored regularly, and was stable.  [place]   Crohn's disease:  On Entyvio per Dr. GeCarlean PurlLast treated with FeShirlean Kelly/2019 (per Dr. GeCarlean Purl Last visit with him was 06/2018, she is due for 6 month f/u (not yet scheduled).  Azathioprine was stopped at her last visit. Bowels are "wonderful". Also was prescribed colestipol, which has helped her diarrhea. Only uses it 2-3x/week (or else it causes constipation). She will see the Ampyra pill in her stool if she has diarrhea, so tries to balance the diarrhea and constipation  She has h/o DVT and PE, 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  She denies bleeding, bruising. She cut her leg in the shower and bled more than usual. No other bleeding noted.  Denies shortness of breath, chest pain. No longer has swelling, no longer uses compression stockings.  Hypertriglyceridemia: She is following a lowfat diet. She is compliant with medications (fenofibrate) and denies side effects. She had labs done prior to visit, see below.  She previously had abnormal paps, HPV. Treated with LEEP in 2011.She is under the care of Dr. SiQuincy Simmondsor her GYN care. She was last seen 12/2017, and had normal pap. She has appt scheduled in March, 2020. She had herMirena IUD replaced in April 2017.She has never been pregnant, and doesn't want children.   Depression follow-up:She remains on Lexapro 108maily and WellbutrinSR100m23mD. Moods are well controlled. Seeing a counselor since late last year.  Now only going as needed, about once a month or less.  Osteopenia:DEXA11/2014showedT -1.6. She had repeat study 10/2016: Results:  Lumbar spine (L1-L4) Femoral neck (FN) 33% distal radius  T-score 1.4 RFN:-1.3 LFN:-1.1 n/a  Change in BMD  from previous DXA test (%) Up 7.8% Down 0.1% n/a  (*) statistically significant  No longer takes Viactive.  Drinks 1 glass of almond milk, eats cottage cheese daily, eats greens.  Immunization History  Administered Date(s) Administered  . Hepatitis A 06/29/2012, 01/04/2013  . Influenza Split 08/09/2012  . Influenza,inj,Quad PF,6+ Mos 09/18/2013, 10/07/2014, 12/15/2015, 08/02/2016, 10/05/2017  . PPD Test 04/30/2013, 10/07/2014  . Pneumococcal  Conjugate-13 04/12/2018  . Pneumococcal Polysaccharide-23 07/06/2012  . Tdap 12/15/2015   Last Pap smear:12/2017, normal with no high risk HPV Last mammogram:12/2015, scheduled for today. Last colonoscopy:06/2017, Dr. Carlean Purl Last DEXA:10/2016 Ophtho: yearly Dentist: twice yearly  Exercise:increasing as her energy is improved.  45 minutes daily of walking/marching in place/treadmill/dancing. Weight-bearing exercise 2x/week for 15 minutes (hand weights 5#, squats with weights). She has also restarted Weight Watchers and is losing weight. She sees dermatologist (Dr. Derrel Nip) regularly  Past Medical History:  Diagnosis Date  . Anemia    related to Crohns flares  . Anxiety   . Arthritis    knees, feet, hands, wrists, related to Crohns flare  . Asthma    Childhood  . B12 deficiency    monitored/treated by Dr. Carlean Purl  . Cervical dysplasia   . Crohn's disease of small intestine ([place]) 06/24/2012   Diagnosed 1999, in [place]. Ileitis only then. Treated with Imuran Remicade and prednisone.  Noncompliant with therapy. 2004 return to care and was treated with Remicade prednisone Pentasa Cipro and Flagyl. 2006 status post right hemicolectomy. Subsequently treated with azathioprine and Cimzia. 200 mg every other week.  azathioprine was added in 2010. Prometheus TP MT enzyme was negative. Has ha  . Depression   . DVT of upper extremity (deep vein thrombosis) ([place]) 11/28/2017  . Eczema    arms and behind knees, worse in winter  . History of recurrent UTI (urinary tract infection)   . HPV (human papilloma virus) infection   . Hyperlipemia   . MS (multiple sclerosis) ([place])   . Neuromuscular disorder ([place])   . Osteopenia of femur neck T. score -1.4 09/29/2011   09/2013 T-1.6  . Papanicolaou smear 12/12   last abnormal 2011  . Pulmonary embolism ([place]) 11/30/2017  . Scaphoid fracture of wrist 2013   left  . Seasonal allergic rhinitis   . Shingles 09/2015   R hip  . Wears glasses      Past Surgical History:  Procedure Laterality Date  . APPENDECTOMY    . CERVICAL BIOPSY  W/ LOOP ELECTRODE EXCISION  2009   ---paps normal since  . COLONOSCOPY  multiple   scanned  . ESOPHAGOGASTRODUODENOSCOPY  multiple   scanned  . HEMICOLECTOMY  2006  . IR ANGIOGRAM PULMONARY BILATERAL SELECTIVE  12/03/2017  . IR ANGIOGRAM SELECTIVE EACH ADDITIONAL VESSEL  12/03/2017  . IR ANGIOGRAM SELECTIVE EACH ADDITIONAL VESSEL  12/03/2017  . IR INFUSION THROMBOL ARTERIAL INITIAL (MS)  12/03/2017  . IR INFUSION THROMBOL ARTERIAL INITIAL (MS)  12/03/2017  . IR IVC FILTER PLMT / S&I /IMG GUID/MOD SED  12/04/2017  . IR THROMB F/U EVAL ART/VEN FINAL DAY (MS)  12/04/2017  . IR US GUIDE VASC ACCESS RIGHT  12/03/2017  . IVC FILTER REMOVAL N/A 07/11/2018   Procedure: IVC FILTER REMOVAL;  Surgeon: Serafina Mitchell, MD;  Location: [facility];  Service: Cardiovascular;  Laterality: N/A;  . IVC VENOGRAPHY N/A 07/11/2018   Procedure: IVC Venography;  Surgeon: Serafina Mitchell, MD;  Location: [facility];  Service: Cardiovascular;  Laterality: N/A;  .  LEEP  2011   --done in [place]  . PERIPHERAL VASCULAR BALLOON ANGIOPLASTY Right 11/30/2017   Procedure: PERIPHERAL VASCULAR BALLOON ANGIOPLASTY;  Surgeon: Serafina Mitchell, MD;  Location: [facility];  Service: Cardiovascular;  Laterality: Right;  . PERIPHERAL VASCULAR THROMBECTOMY N/A 11/29/2017   Procedure: PERIPHERAL VASCULAR THROMBECTOMY - THROMBOLYSIS;  Surgeon: Serafina Mitchell, MD;  Location: [facility];  Service: Cardiovascular;  Laterality: N/A;  LYSIS CATHETER PLACEMENT ONLY  . PERIPHERAL VASCULAR THROMBECTOMY N/A 11/30/2017   Procedure: PERIPHERAL VASCULAR THROMBECTOMY - Lysis Recheck;  Surgeon: Serafina Mitchell, MD;  Location: [facility];  Service: Cardiovascular;  Laterality: N/A;  . WISDOM TOOTH EXTRACTION      Social History   Socioeconomic History  . Marital status: Married    Spouse name: Marcello Moores  . Number of  children: 0  . Years of education: Not on file  . Highest education level: Associate degree: occupational, Hotel manager, or vocational program  Occupational History  . Occupation: Hydrographic surveyor: [employer]  . Financial resource strain: Not on file  . Food insecurity:    Worry: Not on file    Inability: Not on file  . Transportation needs:    Medical: Not on file    Non-medical: Not on file  Tobacco Use  . Smoking status: Former Smoker    Packs/day: 1.00    Years: 4.00    Pack years: 4.00    Last attempt to quit: 11/18/1998    Years since quitting: 20.1  . Smokeless tobacco: Never Used  Substance and Sexual Activity  . Alcohol use: No    Alcohol/week: 0.0 standard drinks  . Drug use: No  . Sexual activity: Yes    Partners: Male    Birth control/protection: I.U.D.    Comment: Mirena inserted 01/2016  Lifestyle  . Physical activity:    Days per week: Not on file    Minutes per session: Not on file  . Stress: Not on file  Relationships  . Social connections:    Talks on phone: Not on file    Gets together: Not on file    Attends religious service: Not on file    Active member of club or organization: Not on file    Attends meetings of clubs or organizations: Not on file    Relationship status: Not on file  . Intimate partner violence:    Fear of current or ex partner: Not on file    Emotionally abused: Not on file    Physically abused: Not on file    Forced sexual activity: Not on file  Other Topics Concern  . Not on file  Social History Narrative   The patient is divorced.     Re-married Marcello Moores, partner of 10 years, on 10/10/16. 2 dogs, 1 cat   No children - doesn't want any   Curator for [employer].   Moved from [place] to [place] in 2013.   Past smoker   No alcohol   2-3 caffeinated beverages a day   She reports she is compliant with sunscreen given her increased risk of sun damage and skin cancer (no  longer on azathioprine)      Updated 12/20/2018    Family History  Problem Relation Age of Onset  . Hypertension Mother   . Breast cancer Mother 80  . Bone cancer Mother        metastatic from breast  .  Depression Mother   . Hypothyroidism Mother   . Colon polyps Father   . Diabetes Father        borderline--resolved 2020  . Hypertension Father   . Hyperlipidemia Father   . Colon cancer Paternal Grandfather   . Multiple sclerosis Sister   . Alcohol abuse Sister   . Fuch's dystrophy Sister   . Stroke Maternal Grandmother   . Lung cancer Maternal Grandmother   . Hypertension Maternal Grandmother   . Heart disease Paternal Grandmother   . Hypertension Paternal Grandmother   . Hypertension Sister     Outpatient Encounter Medications as of 12/20/2018  Medication Sig Note  . buPROPion (WELLBUTRIN SR) 100 MG 12 hr tablet TAKE 1 TABLET(100 MG) BY MOUTH TWICE DAILY   . cetirizine (ZYRTEC) 10 MG tablet Take 10 mg by mouth at bedtime.    . Cholecalciferol (VITAMIN D3) 2000 units TABS Take 3,000 Units by mouth every evening.  12/20/2018: Taking 1 in the morning and 2 at night  . colestipol (COLESTID) 5 g packet TAKE 5 GRAMS BY MOUTH DAILY 12/20/2018: Takes 2-3 times/week  . cyanocobalamin (,VITAMIN B-12,) 1000 MCG/ML injection Inject 1000 mcg weekly x 4 then monthly 12/20/2018: monthly  . dalfampridine (AMPYRA) 10 MG TB12 Take 1 tablet (10 mg total) by mouth 2 (two) times daily.   Marland Kitchen ELIQUIS 5 MG TABS tablet TAKE 1 TABLET(5 MG) BY MOUTH TWICE DAILY (Patient taking differently: Take 5 mg by mouth 2 (two) times daily. )   . escitalopram (LEXAPRO) 10 MG tablet Take 1 tablet (10 mg total) by mouth at bedtime.   . fenofibrate 160 MG tablet Take 1 tablet (160 mg total) by mouth at bedtime.   . gabapentin (NEURONTIN) 100 MG capsule Take 3 capsules (300 mg total) by mouth at bedtime.   Marland Kitchen levonorgestrel (MIRENA) 20 MCG/24HR IUD 1 each by Intrauterine route once. Implanted April 2017   . ocrelizumab  300 mg in sodium chloride 0.9 % 250 mL Inject 300 mg into the vein once.   . vedolizumab (ENTYVIO) 300 MG injection Inject 300 mg into the vein every 8 (eight) weeks.   Marland Kitchen acetaminophen (TYLENOL) 500 MG tablet Take 1,000 mg by mouth every 6 (six) hours as needed for headache (pain). Reported on 12/15/2015   . diphenhydrAMINE (BENADRYL) 25 MG tablet Take 25 mg by mouth at bedtime as needed for sleep.   Marland Kitchen OVER THE COUNTER MEDICATION Apply 1 application topically daily as needed (arthritis pain). CBD Oil   . [DISCONTINUED] amoxicillin (AMOXIL) 500 MG tablet Take 2 tablets (1,000 mg total) by mouth 2 (two) times daily.   . [DISCONTINUED] dalfampridine (AMPYRA) 10 MG TB12 Take 1 tablet (10 mg total) by mouth 2 (two) times daily.   . [DISCONTINUED] Pseudoephedrine-APAP-DM (DAYQUIL PO) Take 2 tablets by mouth as needed. 10/30/2018: Last dose 6am  . [DISCONTINUED] vedolizumab (ENTYVIO) 300 MG injection Infuse 300 mg Entyvio IV q 8 weeks    No facility-administered encounter medications on file as of 12/20/2018.     Allergies  Allergen Reactions  . Ibuprofen Other (See Comments)    Avoids due to Crohns disease  . Morphine And Related Itching    itchy  . Prednisone Other (See Comments)    "crazy", hallucinations  . Adhesive [Tape] Rash    Rash with electrodes    ROS: The patient denies anorexia, fever, headaches, vision changes, decreased hearing, ear pain, sore throat, breast concerns, chest pain, palpitations, dizziness, syncope, cough, nausea, vomiting, indigestion/heartburn, hematuria, dysuria,  vaginal bleeding, discharge, odor or itch, genital lesions, numbness, tingling, tremor, suspicious skin lesions (had 1 removed by derm recently, no new ones), anxiety, abnormal bleeding/bruising or enlarged lymph nodes. Has Mirena, rare spotting on occasion, never heavy Crohn's is controlled, no bleeding. Chronic allergies, controlled by OTC meds.  Energy is much better, gait is better, and no further  incontinence. Some trouble seeing at night, unchanged No joint pains (slight in hip and knees, notices it occurs also when her dog hurts--probably weather related).    PHYSICAL EXAM:  BP 120/70   Pulse 80   Ht 5' 4"  (1.626 m)   Wt 219 lb 9.6 oz (99.6 kg)   BMI 37.69 kg/m   Wt Readings from Last 3 Encounters:  12/20/18 219 lb 9.6 oz (99.6 kg)  12/15/18 221 lb (100.2 kg)  10/30/18 231 lb (104.8 kg)     General Appearance:   Alert, cooperative, no distress, appears stated age  Head:   Normocephalic, without obvious abnormality, atraumatic  Eyes:   PERRL, conjunctiva/corneas clear, EOM's intact, fundi benign  Ears:   Normal TM's and external ear canals  Nose:  Nares normal, mucosa mildly edematous, no drainage or sinus tenderness.   Throat:  Lips, mucosa, and tongue normal; teeth and gums normal  Neck:  Supple, no lymphadenopathy; thyroid: no enlargement/tenderness/ nodules; no carotid bruit or JVD  Back:  Spine nontender, no curvature, ROM normal, noCVA tenderness.  Lungs:   Clear to auscultation bilaterally without wheezes, rales or ronchi; respirations unlabored  Chest Wall:   No tenderness or deformity  Heart:   Regular rate and rhythm, S1 and S2 normal, no murmur, rub or gallop  Breast Exam:   Deferred to GYN  Abdomen:   Soft, nondistended, normoactive bowel sounds, no masses, no hepatosplenomegaly. Nontender.  Genitalia:  deferred to GYN  Rectal:   deferred  Extremities:  No clubbing, cyanosis or edema.   Pulses:  2+ and symmetric all extremities  Skin:  Skin color, texture, turgor normal, no rashes or lesions. On scalp (near crown) there is a slightly raise, but overall very flat, larger area of slightly darker flesh colored lesion; the small, raised lightly pigmented  Raised area is unchanged from last check.  Lymph nodes:  Cervical, supraclavicular, and axillary nodes normal  Neurologic:  CNII-XII intact,  normal strength, sensation; reflexes 2+ and symmetric throughout.    Psych: Normal mood, affect, hygiene and grooming   PHQ-9 score of zero (down from 5, since no longer fatigued)    Chemistry      Component Value Date/Time   NA 140 12/15/2018 0815   K 4.5 12/15/2018 0815   CL 105 12/15/2018 0815   CO2 21 12/15/2018 0815   BUN 15 12/15/2018 0815   CREATININE 1.01 (H) 12/15/2018 0815   CREATININE 0.92 03/14/2018 1435   CREATININE 0.81 12/15/2015 0001      Component Value Date/Time   CALCIUM 8.9 12/15/2018 0815   ALKPHOS 64 12/15/2018 0815   AST 31 12/15/2018 0815   AST 17 03/14/2018 1435   ALT 41 (H) 12/15/2018 0815   ALT 12 03/14/2018 1435   BILITOT <0.2 12/15/2018 0815   BILITOT 0.3 03/14/2018 1435     Fasting glu 80  Lab Results  Component Value Date   WBC 6.5 12/15/2018   HGB 10.8 (L) 12/15/2018   HCT 33.8 (L) 12/15/2018   MCV 82 12/15/2018   PLT 489 (H) 12/15/2018   Lab Results  Component Value Date   FERRITIN 6 (  L) 12/15/2018   Lab Results  Component Value Date   VITAMINB12 366 12/15/2018   Lab Results  Component Value Date   CHOL 160 12/15/2018   HDL 57 12/15/2018   LDLCALC 62 12/15/2018   TRIG 203 (H) 12/15/2018   CHOLHDL 2.8 12/15/2018    ASSESSMENT/PLAN:  Annual physical exam - Plan: POCT Urinalysis DIP (Proadvantage Device)  Hypertriglyceridemia - TG above goal. Cont fenofibrate, lowfat, low cholesterol diet. Recheck 6 mos  Iron deficiency anemia due to chronic blood loss - on anticoagulant; no known bleeding. prior h/o IDA, h/o Feraheme treatments. will send results to GI, due for f/u  Vitamin B12 deficiency - level improved; cont monthly injections  Vitamin D deficiency - rec dose per neuro was 4000 IU  Crohn's disease of small intestine without complication (HCC) - well controlled on current regimen; due for f/u with Dr .Carlean Purl  Depression, major, in remission [facility]) - continue current  medications  Osteopenia, unspecified location - mild; discussed Ca, D, weight-bearing exercise  Multiple sclerosis ([place]) - under care of Dr .Tomi Likens, sx improving on treatment  Immunization counseling - hasn't had flu shot this year, and is at high risk; reviewed in detail--she refuses. Encouraged to get in 07/2019  Pulmonary nodule - LLL noted on CT. Rec trying to get prior studies--if shown to be stable no f/u needed, o/w consider f/u 07/2019 (1 yr f/u)   Will send labs to Dr. Carlean Purl; pt to schedule f/u, due now.  Cont weight watchers, regular exercise, weight loss  F/u 6 months with labs prior.  Unsure of what other labs will be monitored by other treating physicians.  Will order lipids.   Discussed monthly self breast exams and yearly mammograms after the age of 61 (scheduled); at least 30 minutes of aerobic activity at least 5 days/week including weight-bearing exercise at least twice per week; proper sunscreen use reviewed; healthy diet, including goals of calcium and vitamin D intake and alcohol recommendations (less than or equal to 1 drink/day) reviewed; regular seatbelt use; changing batteries in smoke detectors. Immunization recommendations discussed--UTD; yearly flu shots are recommended--pt refuses to get today, worried about it somewhat being affected by her new MS treatments. Prefers to skip this year. Encouraged to get in 07/2019. Colonoscopy recommendations reviewed--per Dr. Carlean Purl.

## 2018-12-19 NOTE — Patient Instructions (Addendum)
  HEALTH MAINTENANCE RECOMMENDATIONS:  It is recommended that you get at least 30 minutes of aerobic exercise at least 5 days/week (for weight loss, you may need as much as 60-90 minutes). This can be any activity that gets your heart rate up. This can be divided in 10-15 minute intervals if needed, but try and build up your endurance at least once a week.  Weight bearing exercise is also recommended twice weekly.  Eat a healthy diet with lots of vegetables, fruits and fiber.  "Colorful" foods have a lot of vitamins (ie green vegetables, tomatoes, red peppers, etc).  Limit sweet tea, regular sodas and alcoholic beverages, all of which has a lot of calories and sugar.  Up to 1 alcoholic drink daily may be beneficial for women (unless trying to lose weight, watch sugars).  Drink a lot of water.  Calcium recommendations are 1200-1500 mg daily (1500 mg for postmenopausal women or women without ovaries), and vitamin D 1000 IU daily.  This should be obtained from diet and/or supplements (vitamins), and calcium should not be taken all at once, but in divided doses.  Monthly self breast exams and yearly mammograms for women over the age of 30 is recommended.  Sunscreen of at least SPF 30 should be used on all sun-exposed parts of the skin when outside between the hours of 10 am and 4 pm (not just when at beach or pool, but even with exercise, golf, tennis, and yard work!)  Use a sunscreen that says "broad spectrum" so it covers both UVA and UVB rays, and make sure to reapply every 1-2 hours.  Remember to change the batteries in your smoke detectors when changing your clock times in the spring and fall.  Use your seat belt every time you are in a car, and please drive safely and not be distracted with cell phones and texting while driving.  Please remember to get your flu shot in September/October of this year. Your triglycerides were up slightly--watch the sugar and fried foods in your diet. No change to  your mediations.  You are to follow up with Dr. Carlean Purl.  I'll send him your labs. Your are anemia again, with low iron stores.  We are ordering lipid panel for your 6 month visit.  If you feel like you're going to be due for follow up on anemia, etc, and nobody else has checked, please let us know ahead of time, and we can add labs to the order.

## 2018-12-20 ENCOUNTER — Ambulatory Visit (INDEPENDENT_AMBULATORY_CARE_PROVIDER_SITE_OTHER): Payer: 59 | Admitting: Family Medicine

## 2018-12-20 ENCOUNTER — Ambulatory Visit
Admission: RE | Admit: 2018-12-20 | Discharge: 2018-12-20 | Disposition: A | Discharge status: Home / Self Care | Payer: 59 | Source: Ambulatory Visit

## 2018-12-20 ENCOUNTER — Encounter: Payer: Self-pay | Admitting: Family Medicine

## 2018-12-20 VITALS — BP 120/70 | HR 80 | Ht 64.0 in | Wt 219.6 lb

## 2018-12-20 DIAGNOSIS — Z1231 Encounter for screening mammogram for malignant neoplasm of breast: Secondary | ICD-10-CM

## 2018-12-20 DIAGNOSIS — G35 Multiple sclerosis: Secondary | ICD-10-CM

## 2018-12-20 DIAGNOSIS — D500 Iron deficiency anemia secondary to blood loss (chronic): Secondary | ICD-10-CM | POA: Diagnosis not present

## 2018-12-20 DIAGNOSIS — Z0000 Encounter for general adult medical examination without abnormal findings: Secondary | ICD-10-CM | POA: Diagnosis not present

## 2018-12-20 DIAGNOSIS — Z7189 Other specified counseling: Secondary | ICD-10-CM

## 2018-12-20 DIAGNOSIS — E559 Vitamin D deficiency, unspecified: Secondary | ICD-10-CM

## 2018-12-20 DIAGNOSIS — F325 Major depressive disorder, single episode, in full remission: Secondary | ICD-10-CM

## 2018-12-20 DIAGNOSIS — R911 Solitary pulmonary nodule: Secondary | ICD-10-CM

## 2018-12-20 DIAGNOSIS — E781 Pure hyperglyceridemia: Secondary | ICD-10-CM | POA: Diagnosis not present

## 2018-12-20 DIAGNOSIS — Z7185 Encounter for immunization safety counseling: Secondary | ICD-10-CM

## 2018-12-20 DIAGNOSIS — E538 Deficiency of other specified B group vitamins: Secondary | ICD-10-CM

## 2018-12-20 DIAGNOSIS — K5000 Crohn's disease of small intestine without complications: Secondary | ICD-10-CM

## 2018-12-20 DIAGNOSIS — M8580 Other specified disorders of bone density and structure, unspecified site: Secondary | ICD-10-CM

## 2018-12-20 LAB — POCT URINALYSIS DIP (PROADVANTAGE DEVICE)
Bilirubin, UA: NEGATIVE
Blood, UA: NEGATIVE
Glucose, UA: NEGATIVE mg/dL
Ketones, POC UA: NEGATIVE mg/dL
Leukocytes, UA: NEGATIVE
Nitrite, UA: NEGATIVE
PH UA: 7 (ref 5.0–8.0)
Specific Gravity, Urine: 1.015
Urobilinogen, Ur: NEGATIVE

## 2018-12-21 ENCOUNTER — Encounter: Payer: Self-pay | Admitting: Family Medicine

## 2018-12-25 NOTE — Progress Notes (Signed)
My Chart message to patient  about supplements - oral vs parenteral iron

## 2018-12-26 ENCOUNTER — Other Ambulatory Visit: Payer: Self-pay | Admitting: Family Medicine

## 2018-12-26 ENCOUNTER — Other Ambulatory Visit: Payer: Self-pay

## 2018-12-26 DIAGNOSIS — F325 Major depressive disorder, single episode, in full remission: Secondary | ICD-10-CM

## 2018-12-26 DIAGNOSIS — D500 Iron deficiency anemia secondary to blood loss (chronic): Secondary | ICD-10-CM

## 2018-12-26 DIAGNOSIS — E781 Pure hyperglyceridemia: Secondary | ICD-10-CM

## 2018-12-26 NOTE — Addendum Note (Signed)
Addended by: Marlon Pel on: 12/26/2018 09:05 AM   Modules accepted: Orders

## 2018-12-27 ENCOUNTER — Ambulatory Visit (HOSPITAL_COMMUNITY)
Admission: RE | Admit: 2018-12-27 | Discharge: 2018-12-27 | Disposition: A | Discharge status: Home / Self Care | Payer: 59 | Source: Ambulatory Visit | Attending: Neurology | Admitting: Neurology

## 2018-12-27 DIAGNOSIS — D500 Iron deficiency anemia secondary to blood loss (chronic): Secondary | ICD-10-CM | POA: Diagnosis not present

## 2018-12-27 MED ORDER — SODIUM CHLORIDE 0.9 % IV SOLN
INTRAVENOUS | Status: DC | PRN
  Administered 2018-12-27: 250 mL via INTRAVENOUS

## 2018-12-27 MED ORDER — SODIUM CHLORIDE 0.9 % IV SOLN
510.0000 mg | INTRAVENOUS | Status: DC
  Administered 2018-12-27: 510 mg via INTRAVENOUS
  Filled 2018-12-27: qty 17

## 2018-12-27 NOTE — Discharge Instructions (Signed)

## 2018-12-27 NOTE — Progress Notes (Signed)
PATIENT CARE CENTER NOTE  Diagnosis: Iron Deficiency Anemia due to chronic blood loss   Provider: Silvano Rusk, MD   Procedure: Shirlean Kelly IV   Note: Patient received IV Feraheme. Observed patient for 30 minutes post-infusion. No adverse reaction noted. Vital signs stable. Discharge instructions given. Patient alert, oriented and ambulatory at discharge.

## 2018-12-29 ENCOUNTER — Encounter (HOSPITAL_COMMUNITY): Payer: Self-pay

## 2018-12-29 ENCOUNTER — Encounter (HOSPITAL_COMMUNITY)
Admission: RE | Admit: 2018-12-29 | Discharge: 2018-12-29 | Disposition: A | Discharge status: Home / Self Care | Payer: 59 | Source: Ambulatory Visit | Attending: Neurology | Admitting: Neurology

## 2018-12-29 DIAGNOSIS — D509 Iron deficiency anemia, unspecified: Secondary | ICD-10-CM | POA: Diagnosis not present

## 2018-12-29 MED ORDER — METHYLPREDNISOLONE SODIUM SUCC 125 MG IJ SOLR: 100.0000 mg | Freq: Once | INTRAMUSCULAR | Status: DC

## 2018-12-29 MED ORDER — SODIUM CHLORIDE 0.9 % IV SOLN
Freq: Once | INTRAVENOUS | Status: AC
  Administered 2018-12-29: 08:00:00 via INTRAVENOUS

## 2018-12-29 MED ORDER — SODIUM CHLORIDE 0.9 % IV SOLN
300.0000 mg | Freq: Once | INTRAVENOUS | Status: AC
  Administered 2018-12-29: 300 mg via INTRAVENOUS
  Filled 2018-12-29: qty 10

## 2018-12-29 MED ORDER — METHYLPREDNISOLONE SODIUM SUCC 125 MG IJ SOLR
INTRAMUSCULAR | Status: AC
  Administered 2018-12-29: 125 mg
  Filled 2018-12-29: qty 2

## 2018-12-29 MED ORDER — DIPHENHYDRAMINE HCL 50 MG/ML IJ SOLN
25.0000 mg | Freq: Once | INTRAMUSCULAR | Status: AC
  Administered 2018-12-29: 25 mg via INTRAVENOUS
  Filled 2018-12-29: qty 1

## 2018-12-29 MED ORDER — ACETAMINOPHEN 325 MG PO TABS
650.0000 mg | ORAL_TABLET | Freq: Once | ORAL | Status: AC
  Administered 2018-12-29: 650 mg via ORAL
  Filled 2018-12-29: qty 2

## 2018-12-29 NOTE — Progress Notes (Signed)
Pt received 2nd infusion of Ocrevus 300 mg IV.  Pt tolerated well.  No reactions noted or complaints.  Pt given next appointment for every 6 months, 07-02-19 at 0800.  Pt also given short stay phone number.  Pt d/c ambulatory to lobby.

## 2019-01-03 ENCOUNTER — Ambulatory Visit (HOSPITAL_COMMUNITY)
Admission: RE | Admit: 2019-01-03 | Discharge: 2019-01-03 | Disposition: A | Discharge status: Home / Self Care | Payer: 59 | Source: Ambulatory Visit | Attending: Neurology | Admitting: Neurology

## 2019-01-03 DIAGNOSIS — D509 Iron deficiency anemia, unspecified: Secondary | ICD-10-CM | POA: Diagnosis not present

## 2019-01-03 DIAGNOSIS — D500 Iron deficiency anemia secondary to blood loss (chronic): Secondary | ICD-10-CM | POA: Diagnosis present

## 2019-01-03 MED ORDER — SODIUM CHLORIDE 0.9 % IV SOLN
INTRAVENOUS | Status: DC | PRN
  Administered 2019-01-03: 250 mL via INTRAVENOUS

## 2019-01-03 MED ORDER — SODIUM CHLORIDE 0.9 % IV SOLN
510.0000 mg | Freq: Once | INTRAVENOUS | Status: AC
  Administered 2019-01-03: 510 mg via INTRAVENOUS
  Filled 2019-01-03: qty 17

## 2019-01-03 NOTE — Discharge Instructions (Signed)

## 2019-01-03 NOTE — Progress Notes (Signed)
PATIENT CARE CENTER NOTE   DIAGNOSIS: Iron Deficiency Anemia    PROVIDER: Darci Current, MD   PROCEDURE: IV Feraheme    NOTE: Pt presented to day hospital for IV Feraheme infusion. No complications with infusion. Observed pt 30 min post-infusion. No adverse reactions noted post infusion. Vital signs stable. AVS given. Pt Alert, Oriented, and ambulatory at discharge.

## 2019-01-10 ENCOUNTER — Ambulatory Visit (INDEPENDENT_AMBULATORY_CARE_PROVIDER_SITE_OTHER): Payer: 59 | Admitting: Internal Medicine

## 2019-01-10 ENCOUNTER — Other Ambulatory Visit: Payer: Self-pay

## 2019-01-10 ENCOUNTER — Encounter: Payer: Self-pay | Admitting: Internal Medicine

## 2019-01-10 VITALS — BP 92/60 | HR 60 | Ht 64.0 in | Wt 219.0 lb

## 2019-01-10 DIAGNOSIS — K5000 Crohn's disease of small intestine without complications: Secondary | ICD-10-CM

## 2019-01-10 DIAGNOSIS — K9089 Other intestinal malabsorption: Secondary | ICD-10-CM

## 2019-01-10 DIAGNOSIS — Z79899 Other long term (current) drug therapy: Secondary | ICD-10-CM | POA: Diagnosis not present

## 2019-01-10 DIAGNOSIS — Z7960 Long term (current) use of unspecified immunomodulators and immunosuppressants: Secondary | ICD-10-CM

## 2019-01-10 DIAGNOSIS — D500 Iron deficiency anemia secondary to blood loss (chronic): Secondary | ICD-10-CM

## 2019-01-10 NOTE — Assessment & Plan Note (Signed)
No problems Quantiferon due 09/2019

## 2019-01-10 NOTE — Assessment & Plan Note (Addendum)
Tx on feraheme Intolerant oral iron CBC and ferritin will be checked 1 month after last infusion feraheme

## 2019-01-10 NOTE — Patient Instructions (Addendum)
  Titrate down your colestipol as discussed.   Please come and have labs drawn the week of April 6th.   Follow up with Dr Carlean Purl in 6 months.   I appreciate the opportunity to care for you. Silvano Rusk, MD, [facility]

## 2019-01-10 NOTE — Assessment & Plan Note (Signed)
Titrate colestipol

## 2019-01-10 NOTE — Progress Notes (Signed)
Lindsay Mccarthy 43 y.o. 06/25/1976 [id number]  Assessment & Plan:   Crohn's disease of small intestine ([place]) Clinical remission on Entyvio at home q 8 weeks RTC 6 mos  Bile salt-induced diarrhea Titrate colestipol   Iron deficiency anemia due to chronic blood loss Tx on feraheme Intolerant oral iron  Long-term use of immunosuppressant medication Entyvio + ocrelizumab No problems Quantiferon due 09/2019   TI:RWERX, Eve, MD   Subjective:   Chief Complaint: f/u Crohn's ileitis on Entyvio and colestipol  HPI trTsh is doing well w/o diarrhea, constipation or abdominal pain. She says she is almost constipated at times and thinks maybe needs less colestipol some days. MS is improved and stable - is on a biologic Tx which will be infused every 6 mos. Receives home infusion of Entyvio. 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   Recent feraheme x 2 for recurrent iron-deficiency anemia  Wt Readings from Last 3 Encounters:  01/10/19 219 lb (99.3 kg)  12/29/18 218 lb 6 oz (99.1 kg)  12/20/18 219 lb 9.6 oz (99.6 kg)   No fevers, rashes, new problems   Allergies  Allergen Reactions  . Ibuprofen Other (See Comments)    Avoids due to Crohns disease  . Morphine And Related Itching    itchy  . Prednisone Other (See Comments)    "crazy", hallucinations Tolerates methylprednisolone  . Adhesive [Tape] Rash    Rash with electrodes   Current Meds  Medication Sig  . acetaminophen (TYLENOL) 500 MG tablet Take 1,000 mg by mouth every 6 (six) hours as needed for headache (pain). Reported on 12/15/2015  . buPROPion (WELLBUTRIN SR) 100 MG 12 hr tablet TAKE 1 TABLET(100 MG) BY MOUTH TWICE DAILY  . cetirizine (ZYRTEC) 10 MG tablet Take 10 mg by mouth at bedtime.   . Cholecalciferol (VITAMIN D3) 2000 units TABS Take 3,000 Units by mouth every evening.   . colestipol (COLESTID) 5 g packet TAKE 5 GRAMS BY MOUTH DAILY  . cyanocobalamin (,VITAMIN B-12,) 1000 MCG/ML injection Inject 1000 mcg  weekly x 4 then monthly  . dalfampridine (AMPYRA) 10 MG TB12 Take 1 tablet (10 mg total) by mouth 2 (two) times daily.  . diphenhydrAMINE (BENADRYL) 25 MG tablet Take 25 mg by mouth at bedtime as needed for sleep.  Marland Kitchen ELIQUIS 5 MG TABS tablet TAKE 1 TABLET(5 MG) BY MOUTH TWICE DAILY  . escitalopram (LEXAPRO) 10 MG tablet TAKE 1 TABLET(10 MG) BY MOUTH AT BEDTIME  . fenofibrate 160 MG tablet TAKE 1 TABLET(160 MG) BY MOUTH AT BEDTIME  . gabapentin (NEURONTIN) 100 MG capsule Take 3 capsules (300 mg total) by mouth at bedtime.  Marland Kitchen levonorgestrel (MIRENA) 20 MCG/24HR IUD 1 each by Intrauterine route once. Implanted April 2017  . ocrelizumab 600 mg in sodium chloride 0.9 % 500 mL Inject 600 mg into the vein every 6 (six) months.  Marland Kitchen OVER THE COUNTER MEDICATION Apply 1 application topically daily as needed (arthritis pain). CBD Oil  . vedolizumab (ENTYVIO) 300 MG injection Inject 300 mg into the vein every 8 (eight) weeks.   Past Medical History:  Diagnosis Date  . Anemia    related to Crohns flares  . Anxiety   . Arthritis    knees, feet, hands, wrists, related to Crohns flare  . Asthma    Childhood  . B12 deficiency    monitored/treated by Dr. Carlean Purl  . Cervical dysplasia   . Crohn's disease of small intestine ([place]) 06/24/2012   Diagnosed 1999, in [place]. Ileitis only then.  Treated with Imuran Remicade and prednisone.  Noncompliant with therapy. 2004 return to care and was treated with Remicade prednisone Pentasa Cipro and Flagyl. 2006 status post right hemicolectomy. Subsequently treated with azathioprine and Cimzia. 200 mg every other week.  azathioprine was added in 2010. Prometheus TP MT enzyme was negative. Has ha  . Depression   . DVT of upper extremity (deep vein thrombosis) ([place]) 11/28/2017  . Eczema    arms and behind knees, worse in winter  . History of recurrent UTI (urinary tract infection)   . HPV (human papilloma virus) infection   . Hyperlipemia   . MS (multiple sclerosis)  ([place])   . Neuromuscular disorder ([place])   . Osteopenia of femur neck T. score -1.4 09/29/2011   09/2013 T-1.6  . Papanicolaou smear 12/12   last abnormal 2011  . Pulmonary embolism ([place]) 11/30/2017  . Scaphoid fracture of wrist 2013   left  . Seasonal allergic rhinitis   . Shingles 09/2015   R hip  . Wears glasses    Past Surgical History:  Procedure Laterality Date  . APPENDECTOMY    . CERVICAL BIOPSY  W/ LOOP ELECTRODE EXCISION  2009   ---paps normal since  . COLONOSCOPY  multiple   scanned  . ESOPHAGOGASTRODUODENOSCOPY  multiple   scanned  . HEMICOLECTOMY  2006  . IR ANGIOGRAM PULMONARY BILATERAL SELECTIVE  12/03/2017  . IR ANGIOGRAM SELECTIVE EACH ADDITIONAL VESSEL  12/03/2017  . IR ANGIOGRAM SELECTIVE EACH ADDITIONAL VESSEL  12/03/2017  . IR INFUSION THROMBOL ARTERIAL INITIAL (MS)  12/03/2017  . IR INFUSION THROMBOL ARTERIAL INITIAL (MS)  12/03/2017  . IR IVC FILTER PLMT / S&I /IMG GUID/MOD SED  12/04/2017  . IR THROMB F/U EVAL ART/VEN FINAL DAY (MS)  12/04/2017  . IR US GUIDE VASC ACCESS RIGHT  12/03/2017  . IVC FILTER REMOVAL N/A 07/11/2018   Procedure: IVC FILTER REMOVAL;  Surgeon: Serafina Mitchell, MD;  Location: [facility];  Service: Cardiovascular;  Laterality: N/A;  . IVC VENOGRAPHY N/A 07/11/2018   Procedure: IVC Venography;  Surgeon: Serafina Mitchell, MD;  Location: [facility];  Service: Cardiovascular;  Laterality: N/A;  . LEEP  2011   --done in [place]  . PERIPHERAL VASCULAR BALLOON ANGIOPLASTY Right 11/30/2017   Procedure: PERIPHERAL VASCULAR BALLOON ANGIOPLASTY;  Surgeon: Serafina Mitchell, MD;  Location: [facility];  Service: Cardiovascular;  Laterality: Right;  . PERIPHERAL VASCULAR THROMBECTOMY N/A 11/29/2017   Procedure: PERIPHERAL VASCULAR THROMBECTOMY - THROMBOLYSIS;  Surgeon: Serafina Mitchell, MD;  Location: [facility];  Service: Cardiovascular;  Laterality: N/A;  LYSIS CATHETER PLACEMENT ONLY  . PERIPHERAL VASCULAR THROMBECTOMY N/A  11/30/2017   Procedure: PERIPHERAL VASCULAR THROMBECTOMY - Lysis Recheck;  Surgeon: Serafina Mitchell, MD;  Location: [facility];  Service: Cardiovascular;  Laterality: N/A;  . WISDOM TOOTH EXTRACTION     Social History   Social History Narrative   The patient is divorced.     Re-married Marcello Moores, partner of 10 years, on 10/10/16. 2 dogs, 1 cat   No children - doesn't want any   Curator for [employer].   Moved from [place] to [place] in 2013.   Past smoker   No alcohol   2-3 caffeinated beverages a day   She reports she is compliant with sunscreen given her increased risk of sun damage and skin cancer (no longer on azathioprine)      Updated 12/20/2018   family history  includes Alcohol abuse in her sister; Bone cancer in her mother; Breast cancer (age of onset: 81) in her mother; Colon cancer in her paternal grandfather; Colon polyps in her father; Depression in her mother; Diabetes in her father; Fuch's dystrophy in her sister; Heart disease in her paternal grandmother; Hyperlipidemia in her father; Hypertension in her father, maternal grandmother, mother, paternal grandmother, and sister; Hypothyroidism in her mother; Lung cancer in her maternal grandmother; Multiple sclerosis in her sister; Stroke in her maternal grandmother.   Review of Systems As per HPI  Objective:   Physical Exam @BP  92/60   Pulse 60   Ht 5' 4"  (1.626 m)   Wt 219 lb (99.3 kg)   BMI 37.59 kg/m @  General:  NAD Eyes:   anicteric Lungs:  clear Heart::  S1S2 no rubs, murmurs or gallops Abdomen:  soft and nontender, BS+ Ext:   no edema, cyanosis or clubbing    Data Reviewed:   See HPI

## 2019-01-10 NOTE — Assessment & Plan Note (Signed)
Clinical remission on Entyvio at home q 8 weeks RTC 6 mos

## 2019-01-17 ENCOUNTER — Ambulatory Visit: Payer: 59 | Admitting: Obstetrics and Gynecology

## 2019-01-30 ENCOUNTER — Other Ambulatory Visit (INDEPENDENT_AMBULATORY_CARE_PROVIDER_SITE_OTHER): Payer: 59

## 2019-01-30 DIAGNOSIS — K5000 Crohn's disease of small intestine without complications: Secondary | ICD-10-CM | POA: Diagnosis not present

## 2019-01-30 DIAGNOSIS — D500 Iron deficiency anemia secondary to blood loss (chronic): Secondary | ICD-10-CM

## 2019-01-30 DIAGNOSIS — K9089 Other intestinal malabsorption: Secondary | ICD-10-CM

## 2019-01-30 LAB — CBC WITH DIFFERENTIAL/PLATELET
BASOS PCT: 0.5 % (ref 0.0–3.0)
Basophils Absolute: 0.1 10*3/uL (ref 0.0–0.1)
Eosinophils Absolute: 0.1 10*3/uL (ref 0.0–0.7)
Eosinophils Relative: 1 % (ref 0.0–5.0)
HCT: 40 % (ref 36.0–46.0)
Hemoglobin: 13.3 g/dL (ref 12.0–15.0)
LYMPHS ABS: 1.3 10*3/uL (ref 0.7–4.0)
Lymphocytes Relative: 13.5 % (ref 12.0–46.0)
MCHC: 33.3 g/dL (ref 30.0–36.0)
MCV: 87.3 fl (ref 78.0–100.0)
Monocytes Absolute: 1 10*3/uL (ref 0.1–1.0)
Monocytes Relative: 10.6 % (ref 3.0–12.0)
NEUTROS ABS: 7.2 10*3/uL (ref 1.4–7.7)
Neutrophils Relative %: 74.4 % (ref 43.0–77.0)
PLATELETS: 383 10*3/uL (ref 150.0–400.0)
RBC: 4.57 Mil/uL (ref 3.87–5.11)
RDW: 21.4 % — AB (ref 11.5–15.5)
WBC: 9.7 10*3/uL (ref 4.0–10.5)

## 2019-01-31 ENCOUNTER — Other Ambulatory Visit: Payer: Self-pay | Admitting: Internal Medicine

## 2019-01-31 DIAGNOSIS — D500 Iron deficiency anemia secondary to blood loss (chronic): Secondary | ICD-10-CM

## 2019-01-31 LAB — FERRITIN: Ferritin: 67 ng/mL (ref 10.0–291.0)

## 2019-01-31 NOTE — Progress Notes (Signed)
Labs look better - normal!  The ferritin is 67 - would like it to be higher.  If you can tolerate any oral iron - perhaps a liquid that would help  Could do every other day  If not no worries  Please come back in early June and we will recheck these labs and see if you need more feraheme  Gatha Mayer, MD, [facility]

## 2019-02-07 IMAGING — CT CT ABD-PELV W/ CM
2 of 6 series · 14 of 46 positions shown, 16 images · IV contrast (Omni 300)
Comparison: 11/27/2017

CLINICAL DATA: High pretest probability of pulmonary emboli. Mid
back pain, shortness of breath.

EXAM:
CT ANGIOGRAPHY CHEST
CT ABDOMEN AND PELVIS WITH CONTRAST
TECHNIQUE: Multidetector CT imaging of the chest was performed using the
standard protocol during bolus administration of intravenous
contrast. Multiplanar CT image reconstructions and MIPs were
obtained to evaluate the vascular anatomy. Multidetector CT imaging
of the abdomen and pelvis was performed using the standard protocol
during bolus administration of intravenous contrast.
CONTRAST:  100mL [id number] IOPAMIDOL (JKF69E-DYR) INJECTION 76%

[Series 6: pe thins · axial · 0.64mm/px · z∈[+1141,+1375]mm · 11 of 262 slices shown, 13 images]
[im 14/262  soft-tissue]
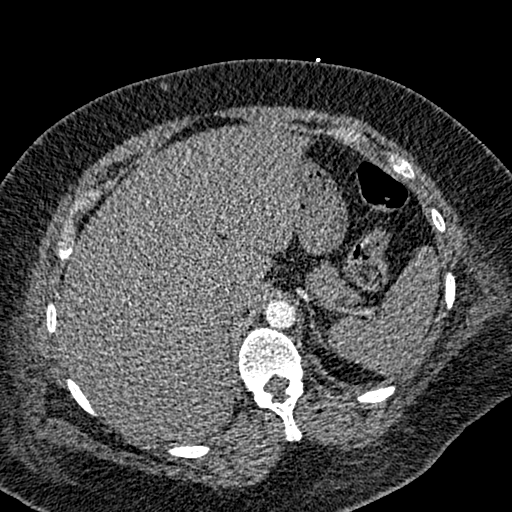
[im 14/262  bone]
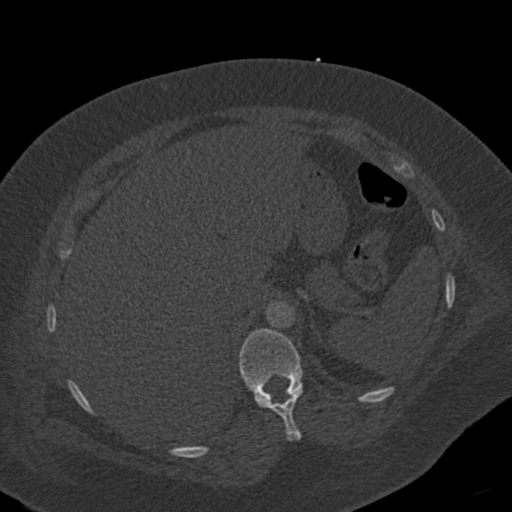
[im 40/262  soft-tissue]
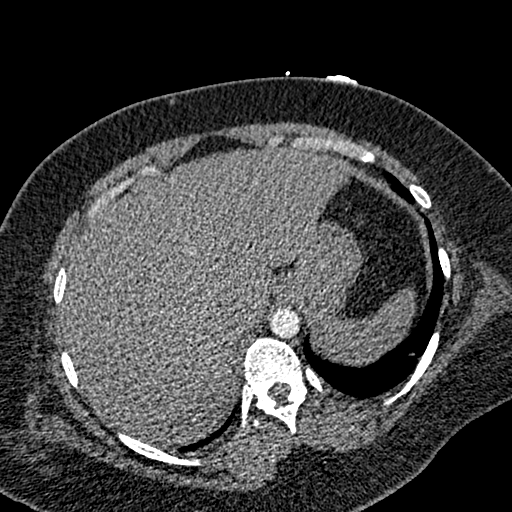
[im 66/262  soft-tissue]
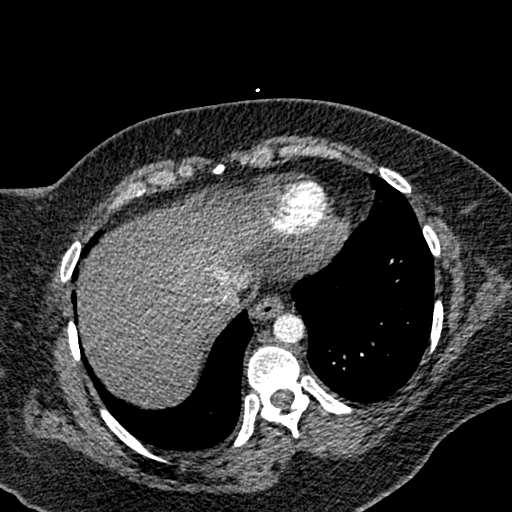
[im 92/262  soft-tissue]
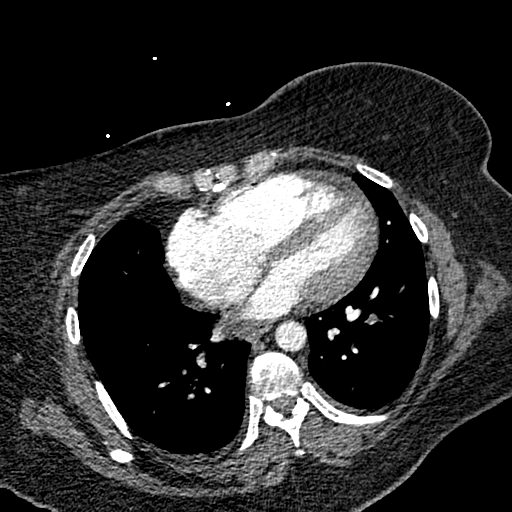
[im 105/262  soft-tissue]
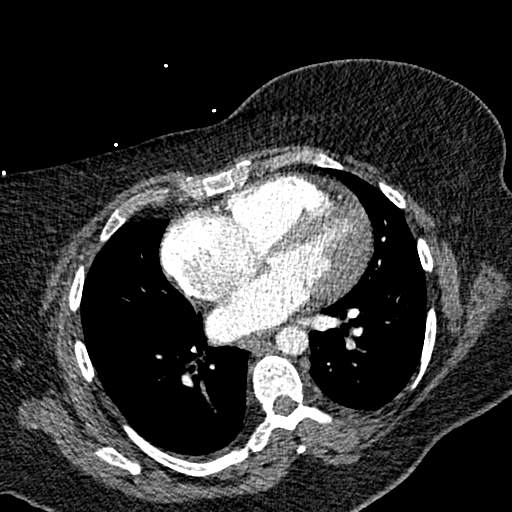
[im 131/262  soft-tissue]
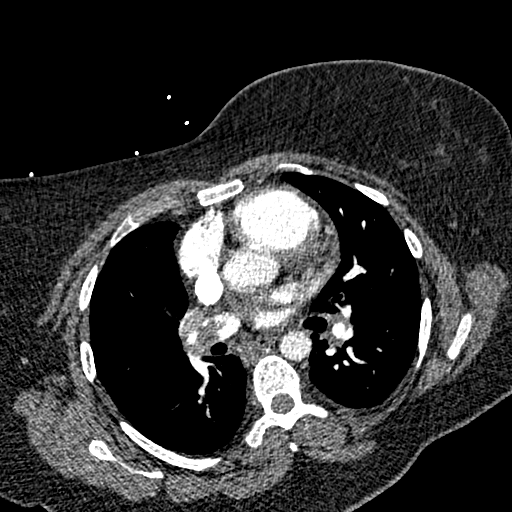
[im 157/262  soft-tissue]
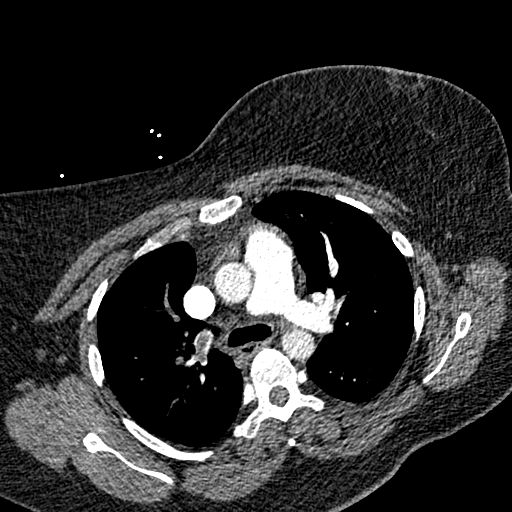
[im 170/262  soft-tissue]
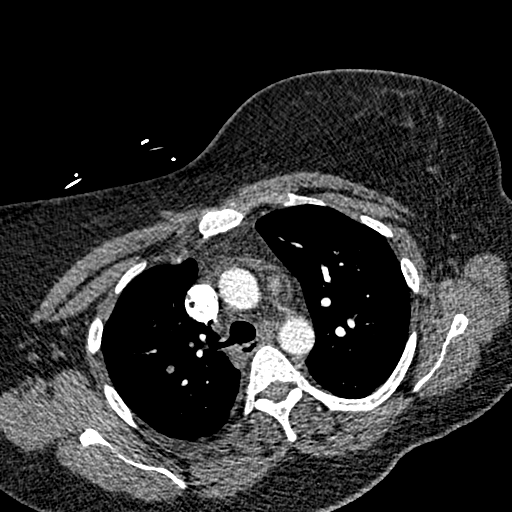
[im 196/262  soft-tissue]
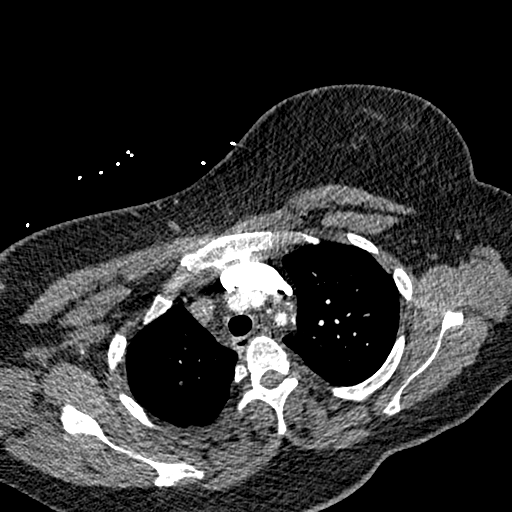
[im 196/262  bone]
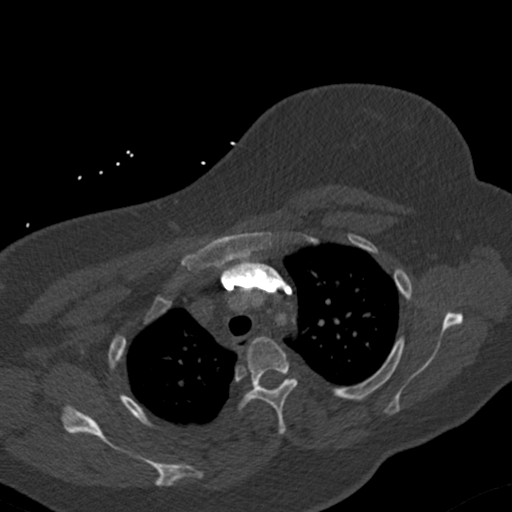
[im 222/262  soft-tissue]
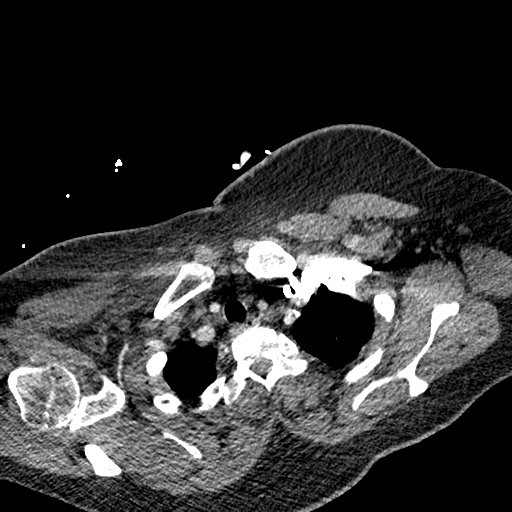
[im 248/262  soft-tissue]
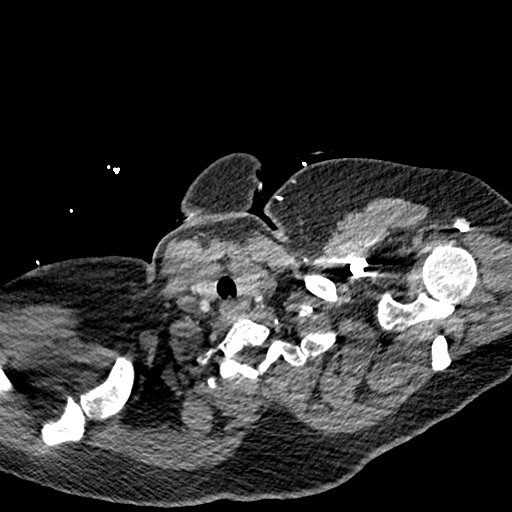

[Series 7: pe 2mm cor · coronal · 0.50mm/px · 3 of 151 slices shown]
[im 38/151  soft-tissue]
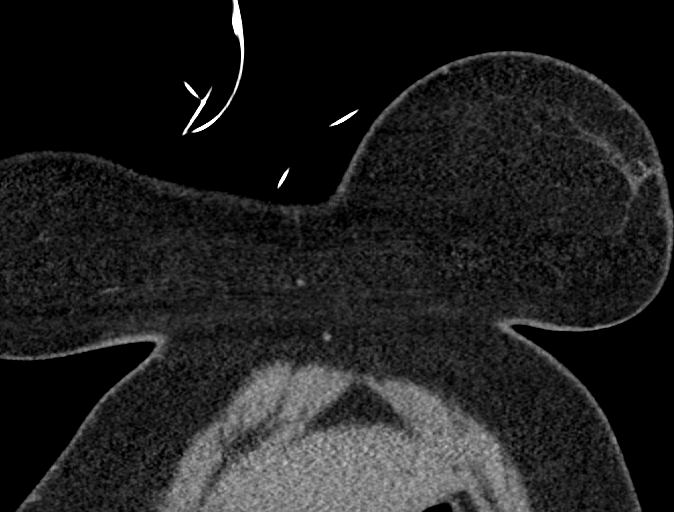
[im 76/151  soft-tissue]
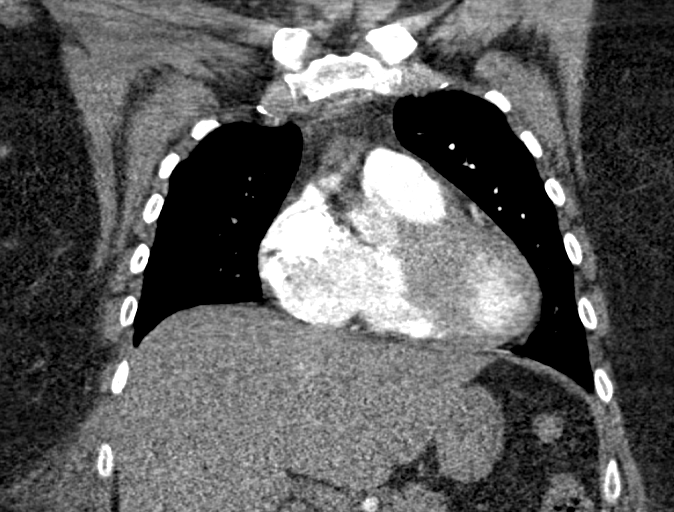
[im 113/151  soft-tissue]
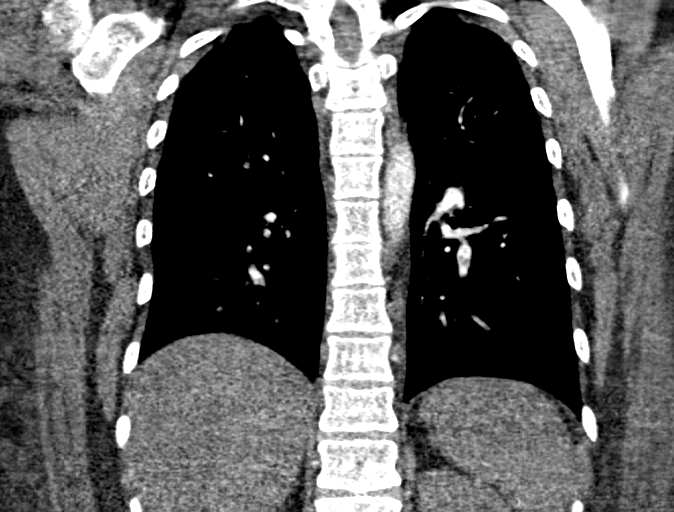

[14 of 46 positions shown; findings below may reference images not displayed]

FINDINGS: CTA CHEST FINDINGS

Cardiovascular: Mild right atrial enlargement. Dilated right
ventricle, RV/LV ratio 1.5. Large partially occlusive acute
pulmonary embolus in the right pulmonary artery extending into the
upper and lower lobe segmental branches. Chronic intraluminal web in
left lower lobe pulmonary artery branch with partially occlusive PE
in posterior and lateral basal segmental branches, more conspicuous
than on prior exam.

Adequate contrast opacification of the thoracic aorta with no
evidence of dissection, aneurysm, or stenosis. There is classic
3-vessel brachiocephalic arch anatomy without proximal stenosis. No
significant atheromatous irregularity.

Mediastinum/Nodes: No enlarged mediastinal, hilar, or axillary lymph
nodes. Thyroid gland, trachea, and esophagus demonstrate no
significant findings.

Lungs/Pleura: Stable presumed bronchocele in the lateral basal
segment left lower lobe. New interstitial infiltrate in the left
upper lobe suprahilar region. Right lung clear. No pneumothorax. No
pleural effusion.

Musculoskeletal: No chest wall abnormality. No acute or significant
osseous findings.

Review of the MIP images confirms the above findings.

CT ABDOMEN and PELVIS FINDINGS

Hepatobiliary: No focal liver abnormality is seen. No gallstones,
gallbladder wall thickening, or biliary dilatation.

Pancreas: Unremarkable. No pancreatic ductal dilatation or
surrounding inflammatory changes.

Spleen: Normal in size without focal abnormality.

Adrenals/Urinary Tract: Normal adrenals. 0.8 cm probable cyst, upper
pole left kidney. 5.2 cm cyst, lower pole right kidney. No
hydronephrosis. Urinary bladder incompletely distended.

Stomach/Bowel: Stomach is nondistended. Small bowel decompressed.
Anastomotic staple line in the terminal ileum. The colon is
nondilated.

Vascular/Lymphatic: No significant arterial pathology evident.
Partially occlusive DVT in the right femoral, deep femoral, and
common femoral veins. There is also some partially occlusive
thrombus in the right iliac vein just proximal to its confluence
with the cava. IVC appears widely patent. Portal vein patent. No
retroperitoneal hemorrhage.

Reproductive: Uterus and bilateral adnexa are unremarkable. IUD in
expected location.

Other: No ascites.  No free air.

Musculoskeletal: Edematous/inflammatory changes in the subcutaneous
tissues of the visualized proximal right thigh. If regional bones
unremarkable.

Review of the MIP images confirms the above findings.
IMPRESSION: 1. Positive for central partially occlusive right and segmental left
lower lobe pulmonary emboli with CT evidence of right heart strain
(RV/LV Ratio = 1.5) consistent with at least submassive
(intermediate risk) PE. The presence of right heart strain has been
associated with an increased risk of morbidity and mortality.
Critical Value/emergent results were called by telephone at the time
of interpretation on 12/02/2017 at [DATE] to Dr. DON LOLITO ONACRAM , who
verbally acknowledged these results.
2. Residual partially occlusive DVT in the right lower extremity and
right iliac vein. No evidence of caval thrombus.

## 2019-03-06 NOTE — Progress Notes (Signed)
Virtual Visit via Video Note The purpose of this virtual visit is to provide medical care while limiting exposure to the novel coronavirus.    Consent was obtained for video visit:  Yes.   Answered questions that patient had about telehealth interaction:  Yes.   I discussed the limitations, risks, security and privacy concerns of performing an evaluation and management service by telemedicine. I also discussed with the patient that there may be a patient responsible charge related to this service. The patient expressed understanding and agreed to proceed.  Pt location: Home Physician Location: Office Name of referring provider:  Rita Ohara, MD I connected with Ileene Hutchinson at patients initiation/request on 03/07/2019 at  9:00 AM EDT by video enabled telemedicine application and verified that I am speaking with the correct person using two identifiers. Pt MRN:  [id number] Pt DOB:  02/02/1976 Video Participants:  Ileene Hutchinson   History of Present Illness:  Lindsay Mccarthy is a 43 year old right-handed woman with Crohn's disease, depression, and anxiety who follows up for multiple sclerosis.  UPDATE: JC Virus antibody was positive with index of 3.26.  It was decided to start [place].  She tested negative for Hep B.  Vitamin D level was 21 and she was advised to start D3 2000 IU daily.   CBC was unremarkable.  CMP was negative.  Repeat B12 level from 12/15/18 was 366.  Her first dose of Ocrevus was on 12/15/18, followed by the second dose on 12/29/18.  Current disease modifying therapy:  Ocrevus.   Other medication:  D3 2000 IU daily, B12 injections, Ampyra, Wellbutrin, Lexapro 9m, gabapentin 3068mat bedtime, Entyvio  Vision:  No issues Motor:  No new issues Sensory:  Numbness and tngling Pain:  Leg pain resolved since starting gabapentin.  But she notes some muscle spasms/twitches involving the body (either eye, stomach or foot). Gait:  Improved since starting Ampyra  Bowel/Bladder:  No recent urinary incontinence Fatigue:  Has more energy.  Rarely takes naps now. Cognition:  No issues Mood:  Stable  HISTORY: She reports history of various neurologic symptoms since her late 2051s- Urinary incontinence: It started about a year ago and has gotten worse.It occurs suddenly, once or twice a month, usually at home and while standing, such as while washing dishes with the water running. It occurs during fatigued oremotional stress as well. She denies increased urinary frequency or dysuria. No perineal numbness. No low back pain. - She reports poor night vision with difficulty seeing while driving at night.She has trouble focusing. Recent eye exam showed poor "refocusing", which would normally be seen in an older adult. Her sister had Fuchs dystrophy but she doesn't have it. - She reports numbness and tingling in her hands when she wakes up in the morning or in the evening after work. - She reports abnormal gait. It feels like her feet are stuck in cement. She has trouble lifting her feet. It causes hip pain. She trips over her feet. It is worse when there is an extreme change in temperature. She started having trouble in her mid-30s. Worse over the past 2 years. She reports a stabbing pain in her hips or knees. She reports previously being diagnosed with a pinched nerve in the back that caused a left foot dropin the 20s.She had PT and injections which was ineffective. No numbness in the legs.  - When she throws a ball, her hand doesn't release immediately. There is a delay until she is able to  release the ball. She has similar problems with her left hand. No neck pain. - She reports fatigued. She needs to take a nap during the day.  Prior sleep study was normal.  One of her twosistershas multiple sclerosis.  Other history: She has Crohn's disease. Earlier this year, she developed a DVT in her right leg. She also was found to  have B12 deficiency and is being treated with injections.  01/09/18 LABS: B12 143, Sed rate 18. Hypercoagulable panel unremarkable, including lupus anticoagulant panel, dRVVT mix, beta-2-glycoprotein, cardiolipin antibodies, prothrombin gene mutation, factor V Leiden.  NMO/AQP4+ antibody was negative.  Her insurance would not cover anti-MOG antibody testing.  07/22/18: MRI Cervical &Thoracic spine w/wo contrast: Multiple T2/STIR hyperintense signal within the spinal cord at C2, C3-C4, C6-C7, T1, T3-T4, T6-T7, and T12 levels, all non-enhancing. She also demonstrates degenerative spinal stenosis at C4-C5 and C5-C6 with mild spinal cord mass effect. 07/24/18: MRI Brain w/wo contrast: Mild hyperintensein the periventricular and juxtacortical white matter withsignal running perpendicular from the lateral ventricle and involving the corpus callosum, non-enhancing.  Past Medical History: Past Medical History:  Diagnosis Date  . Anemia    related to Crohns flares  . Anxiety   . Arthritis    knees, feet, hands, wrists, related to Crohns flare  . Asthma    Childhood  . B12 deficiency    monitored/treated by Dr. Carlean Purl  . Cervical dysplasia   . Crohn's disease of small intestine ([place]) 06/24/2012   Diagnosed 1999, in [place]. Ileitis only then. Treated with Imuran Remicade and prednisone.  Noncompliant with therapy. 2004 return to care and was treated with Remicade prednisone Pentasa Cipro and Flagyl. 2006 status post right hemicolectomy. Subsequently treated with azathioprine and Cimzia. 200 mg every other week.  azathioprine was added in 2010. Prometheus TP MT enzyme was negative. Has ha  . Depression   . DVT of upper extremity (deep vein thrombosis) ([place]) 11/28/2017  . Eczema    arms and behind knees, worse in winter  . History of recurrent UTI (urinary tract infection)   . HPV (human papilloma virus) infection   . Hyperlipemia   . MS (multiple sclerosis) ([place])   . Neuromuscular  disorder ([place])   . Osteopenia of femur neck T. score -1.4 09/29/2011   09/2013 T-1.6  . Papanicolaou smear 12/12   last abnormal 2011  . Pulmonary embolism ([place]) 11/30/2017  . Scaphoid fracture of wrist 2013   left  . Seasonal allergic rhinitis   . Shingles 09/2015   R hip  . Wears glasses     Medications: Outpatient Encounter Medications as of 03/07/2019  Medication Sig Note  . acetaminophen (TYLENOL) 500 MG tablet Take 1,000 mg by mouth every 6 (six) hours as needed for headache (pain). Reported on 12/15/2015   . buPROPion (WELLBUTRIN SR) 100 MG 12 hr tablet TAKE 1 TABLET(100 MG) BY MOUTH TWICE DAILY   . cetirizine (ZYRTEC) 10 MG tablet Take 10 mg by mouth at bedtime.    . Cholecalciferol (VITAMIN D3) 2000 units TABS Take 3,000 Units by mouth every evening.  12/20/2018: Taking 1 in the morning and 2 at night  . colestipol (COLESTID) 5 g packet TAKE 5 GRAMS BY MOUTH DAILY 12/20/2018: Takes 2-3 times/week  . cyanocobalamin (,VITAMIN B-12,) 1000 MCG/ML injection Inject 1000 mcg weekly x 4 then monthly 12/20/2018: monthly  . dalfampridine (AMPYRA) 10 MG TB12 Take 1 tablet (10 mg total) by mouth 2 (two) times daily.   . diphenhydrAMINE (BENADRYL) 25 MG  tablet Take 25 mg by mouth at bedtime as needed for sleep.   Marland Kitchen ELIQUIS 5 MG TABS tablet TAKE 1 TABLET(5 MG) BY MOUTH TWICE DAILY   . escitalopram (LEXAPRO) 10 MG tablet TAKE 1 TABLET(10 MG) BY MOUTH AT BEDTIME   . fenofibrate 160 MG tablet TAKE 1 TABLET(160 MG) BY MOUTH AT BEDTIME   . gabapentin (NEURONTIN) 100 MG capsule Take 3 capsules (300 mg total) by mouth at bedtime.   Marland Kitchen levonorgestrel (MIRENA) 20 MCG/24HR IUD 1 each by Intrauterine route once. Implanted April 2017   . ocrelizumab 600 mg in sodium chloride 0.9 % 500 mL Inject 600 mg into the vein every 6 (six) months.   Marland Kitchen OVER THE COUNTER MEDICATION Apply 1 application topically daily as needed (arthritis pain). CBD Oil   . vedolizumab (ENTYVIO) 300 MG injection Inject 300 mg into the vein  every 8 (eight) weeks.    No facility-administered encounter medications on file as of 03/07/2019.     Allergies: Allergies  Allergen Reactions  . Ibuprofen Other (See Comments)    Avoids due to Crohns disease  . Morphine And Related Itching    itchy  . Prednisone Other (See Comments)    "crazy", hallucinations Tolerates methylprednisolone  . Adhesive [Tape] Rash    Rash with electrodes    Family History: Family History  Problem Relation Age of Onset  . Hypertension Mother   . Breast cancer Mother 51  . Bone cancer Mother        metastatic from breast  . Depression Mother   . Hypothyroidism Mother   . Colon polyps Father   . Diabetes Father        borderline--resolved 2020  . Hypertension Father   . Hyperlipidemia Father   . Colon cancer Paternal Grandfather   . Multiple sclerosis Sister   . Alcohol abuse Sister   . Fuch's dystrophy Sister   . Stroke Maternal Grandmother   . Lung cancer Maternal Grandmother   . Hypertension Maternal Grandmother   . Heart disease Paternal Grandmother   . Hypertension Paternal Grandmother   . Hypertension Sister     Social History: Social History   Socioeconomic History  . Marital status: Married    Spouse name: Marcello Moores  . Number of children: 0  . Years of education: Not on file  . Highest education level: Associate degree: occupational, Hotel manager, or vocational program  Occupational History  . Occupation: Hydrographic surveyor: [employer]  . Financial resource strain: Not on file  . Food insecurity:    Worry: Not on file    Inability: Not on file  . Transportation needs:    Medical: Not on file    Non-medical: Not on file  Tobacco Use  . Smoking status: Former Smoker    Packs/day: 1.00    Years: 4.00    Pack years: 4.00    Last attempt to quit: 11/18/1998    Years since quitting: 20.3  . Smokeless tobacco: Never Used  Substance and Sexual Activity  . Alcohol use: No    Alcohol/week: 0.0  standard drinks  . Drug use: No  . Sexual activity: Yes    Partners: Male    Birth control/protection: I.U.D.    Comment: Mirena inserted 01/2016  Lifestyle  . Physical activity:    Days per week: Not on file    Minutes per session: Not on file  . Stress: Not on file  Relationships  . Social connections:  Talks on phone: Not on file    Gets together: Not on file    Attends religious service: Not on file    Active member of club or organization: Not on file    Attends meetings of clubs or organizations: Not on file    Relationship status: Not on file  . Intimate partner violence:    Fear of current or ex partner: Not on file    Emotionally abused: Not on file    Physically abused: Not on file    Forced sexual activity: Not on file  Other Topics Concern  . Not on file  Social History Narrative   The patient is divorced.     Re-married Marcello Moores, partner of 10 years, on 10/10/16. 2 dogs, 1 cat   No children - doesn't want any   Curator for [employer].   Moved from [place] to [place] in 2013.   Past smoker   No alcohol   2-3 caffeinated beverages a day   She reports she is compliant with sunscreen given her increased risk of sun damage and skin cancer (no longer on azathioprine)      Updated 12/20/2018   Observations/Objective:   Blood pressure 110/70, height 5' 4"  (1.626 m), weight 215 lb 12.8 oz (97.9 kg). Alert and oriented.  Speech fluent and not dysarthric.  Language intact.  Eyes orthophoric on primary gaze.  No pronator drift.  Finger to nose intact.  Romberg with sway.  Assessment and Plan:   Multiple sclerosis  1.  Ocrevus 2.  Gabapentin 312m at bedtime for pain 3.  Ampyra to help with gait 4.  D3 2000 IU daily 5.  Repeat MRI of brain/cervical/thoracic spine with and without contrast in 6 months. 6.  Repeat vitamin D level in 6 months 7.  Follow up in 6 months (after repeat testing)  Follow Up Instructions:    -I discussed the  assessment and treatment plan with the patient. The patient was provided an opportunity to ask questions and all were answered. The patient agreed with the plan and demonstrated an understanding of the instructions.   The patient was advised to call back or seek an in-person evaluation if the symptoms worsen or if the condition fails to improve as anticipated.    Total Time spent in visit with the patient was:  15 minutes   ADudley Major DO

## 2019-03-07 ENCOUNTER — Encounter: Payer: Self-pay | Admitting: Neurology

## 2019-03-07 ENCOUNTER — Other Ambulatory Visit: Payer: Self-pay

## 2019-03-07 ENCOUNTER — Telehealth (INDEPENDENT_AMBULATORY_CARE_PROVIDER_SITE_OTHER): Payer: 59 | Admitting: Neurology

## 2019-03-07 DIAGNOSIS — G35 Multiple sclerosis: Secondary | ICD-10-CM

## 2019-03-09 ENCOUNTER — Ambulatory Visit: Payer: 59 | Admitting: Neurology

## 2019-03-09 ENCOUNTER — Ambulatory Visit: Payer: 59 | Admitting: Obstetrics and Gynecology

## 2019-03-13 ENCOUNTER — Ambulatory Visit: Payer: 59 | Admitting: Neurology

## 2019-03-26 ENCOUNTER — Other Ambulatory Visit: Payer: Self-pay | Admitting: Neurology

## 2019-04-25 ENCOUNTER — Other Ambulatory Visit: Payer: Self-pay

## 2019-04-26 NOTE — Progress Notes (Signed)
43 y.o. G75P0000 Married Caucasian female here for annual exam.    Little bit of spotting once in a while.  No pain or discomfort.   Doing weight watchers.   She had her IVC filter removed.  On Elaquis.   Her mother passed from metastatic breast cancer one year ago.  She has done counseling but this stopped during the pandemic.  She plans to go to [place] to see her father this summer.   PCP:   Rita Ohara, MD  No LMP recorded. (Menstrual status: IUD).           Sexually active: Yes.    The current method of family planning is Mirena IUD inserted 02/12/16.    Exercising: Yes.    walking Smoker:  Former smoker  Health Maintenance: Pap:  01-09-18 negative, HR HPV negative          01-13-16 negative, HR HPV negative  History of abnormal Pap:  yes MMG:  12-20-2018 density B/BIRADS 1 negative   Colonoscopy:  06-22-17 Crohn's- Dr. Carlean Purl BMD:   10-2016  Result  osteopenia TDaP:  12-15-15  Gardasil:   n/a HIV: 11-27-17 negative  Hep C: done in the past  Screening Labs:  Hb today: PCP, Urine today: not collected   reports that she quit smoking about 20 years ago. She has a 4.00 pack-year smoking history. She has never used smokeless tobacco. She reports that she does not drink alcohol or use drugs.  Past Medical History:  Diagnosis Date  . Anemia    related to Crohns flares  . Anxiety   . Arthritis    knees, feet, hands, wrists, related to Crohns flare  . Asthma    Childhood  . B12 deficiency    monitored/treated by Dr. Carlean Purl  . Cervical dysplasia   . Crohn's disease of small intestine ([place]) 06/24/2012   Diagnosed 1999, in [place]. Ileitis only then. Treated with Imuran Remicade and prednisone.  Noncompliant with therapy. 2004 return to care and was treated with Remicade prednisone Pentasa Cipro and Flagyl. 2006 status post right hemicolectomy. Subsequently treated with azathioprine and Cimzia. 200 mg every other week.  azathioprine was added in 2010. Prometheus TP MT enzyme was  negative. Has ha  . Depression   . DVT of upper extremity (deep vein thrombosis) ([place]) 11/28/2017  . Eczema    arms and behind knees, worse in winter  . History of recurrent UTI (urinary tract infection)   . HPV (human papilloma virus) infection   . Hyperlipemia   . MS (multiple sclerosis) ([place])   . Neuromuscular disorder ([place])   . Osteopenia of femur neck T. score -1.4 09/29/2011   09/2013 T-1.6  . Papanicolaou smear 12/12   last abnormal 2011  . Pulmonary embolism ([place]) 11/30/2017  . Scaphoid fracture of wrist 2013   left  . Seasonal allergic rhinitis   . Shingles 09/2015   R hip  . Wears glasses     Past Surgical History:  Procedure Laterality Date  . APPENDECTOMY    . CERVICAL BIOPSY  W/ LOOP ELECTRODE EXCISION  2009   ---paps normal since  . COLONOSCOPY  multiple   scanned  . ESOPHAGOGASTRODUODENOSCOPY  multiple   scanned  . HEMICOLECTOMY  2006  . IR ANGIOGRAM PULMONARY BILATERAL SELECTIVE  12/03/2017  . IR ANGIOGRAM SELECTIVE EACH ADDITIONAL VESSEL  12/03/2017  . IR ANGIOGRAM SELECTIVE EACH ADDITIONAL VESSEL  12/03/2017  . IR INFUSION THROMBOL ARTERIAL INITIAL (MS)  12/03/2017  . IR INFUSION THROMBOL  ARTERIAL INITIAL (MS)  12/03/2017  . IR IVC FILTER PLMT / S&I /IMG GUID/MOD SED  12/04/2017  . IR THROMB F/U EVAL ART/VEN FINAL DAY (MS)  12/04/2017  . IR US GUIDE VASC ACCESS RIGHT  12/03/2017  . IVC FILTER REMOVAL N/A 07/11/2018   Procedure: IVC FILTER REMOVAL;  Surgeon: Serafina Mitchell, MD;  Location: [facility];  Service: Cardiovascular;  Laterality: N/A;  . IVC VENOGRAPHY N/A 07/11/2018   Procedure: IVC Venography;  Surgeon: Serafina Mitchell, MD;  Location: [facility];  Service: Cardiovascular;  Laterality: N/A;  . LEEP  2011   --done in [place]  . PERIPHERAL VASCULAR BALLOON ANGIOPLASTY Right 11/30/2017   Procedure: PERIPHERAL VASCULAR BALLOON ANGIOPLASTY;  Surgeon: Serafina Mitchell, MD;  Location: [facility];  Service: Cardiovascular;  Laterality:  Right;  . PERIPHERAL VASCULAR THROMBECTOMY N/A 11/29/2017   Procedure: PERIPHERAL VASCULAR THROMBECTOMY - THROMBOLYSIS;  Surgeon: Serafina Mitchell, MD;  Location: [facility];  Service: Cardiovascular;  Laterality: N/A;  LYSIS CATHETER PLACEMENT ONLY  . PERIPHERAL VASCULAR THROMBECTOMY N/A 11/30/2017   Procedure: PERIPHERAL VASCULAR THROMBECTOMY - Lysis Recheck;  Surgeon: Serafina Mitchell, MD;  Location: [facility];  Service: Cardiovascular;  Laterality: N/A;  . WISDOM TOOTH EXTRACTION      Current Outpatient Medications  Medication Sig Dispense Refill  . acetaminophen (TYLENOL) 500 MG tablet Take 1,000 mg by mouth every 6 (six) hours as needed for headache (pain). Reported on 12/15/2015    . buPROPion (WELLBUTRIN SR) 100 MG 12 hr tablet TAKE 1 TABLET(100 MG) BY MOUTH TWICE DAILY 180 tablet 1  . cetirizine (ZYRTEC) 10 MG tablet Take 10 mg by mouth at bedtime.     . Cholecalciferol (VITAMIN D3) 2000 units TABS Take 3,000 Units by mouth every evening.     . colestipol (COLESTID) 5 g packet TAKE 5 GRAMS BY MOUTH DAILY 30 each 5  . cyanocobalamin (,VITAMIN B-12,) 1000 MCG/ML injection Inject 1000 mcg weekly x 4 then monthly 10 mL 1  . dalfampridine (AMPYRA) 10 MG TB12 Take 1 tablet (10 mg total) by mouth 2 (two) times daily. 60 tablet 6  . diphenhydrAMINE (BENADRYL) 25 MG tablet Take 25 mg by mouth at bedtime as needed for sleep.    Marland Kitchen ELIQUIS 5 MG TABS tablet TAKE 1 TABLET(5 MG) BY MOUTH TWICE DAILY 180 tablet 1  . escitalopram (LEXAPRO) 10 MG tablet TAKE 1 TABLET(10 MG) BY MOUTH AT BEDTIME 90 tablet 1  . fenofibrate 160 MG tablet TAKE 1 TABLET(160 MG) BY MOUTH AT BEDTIME 90 tablet 1  . gabapentin (NEURONTIN) 100 MG capsule TAKE 3 CAPSULES(300 MG) BY MOUTH AT BEDTIME 180 capsule 3  . levonorgestrel (MIRENA) 20 MCG/24HR IUD 1 each by Intrauterine route once. Implanted April 2017    . ocrelizumab 600 mg in sodium chloride 0.9 % 500 mL Inject 600 mg into the vein every 6 (six) months.     Marland Kitchen OVER THE COUNTER MEDICATION Apply 1 application topically daily as needed (arthritis pain). CBD Oil    . vedolizumab (ENTYVIO) 300 MG injection Inject 300 mg into the vein every 8 (eight) weeks. 1 vial 6   No current facility-administered medications for this visit.     Family History  Problem Relation Age of Onset  . Hypertension Mother   . Breast cancer Mother 5  . Bone cancer Mother        metastatic from breast  . Depression Mother   .  Hypothyroidism Mother   . Colon polyps Father   . Diabetes Father        borderline--resolved 2020  . Hypertension Father   . Hyperlipidemia Father   . Colon cancer Paternal Grandfather   . Multiple sclerosis Sister   . Alcohol abuse Sister   . Fuch's dystrophy Sister   . Stroke Maternal Grandmother   . Lung cancer Maternal Grandmother   . Hypertension Maternal Grandmother   . Heart disease Paternal Grandmother   . Hypertension Paternal Grandmother   . Hypertension Sister     Review of Systems  Constitutional: Negative.   HENT: Negative.   Eyes: Negative.   Respiratory: Negative.   Cardiovascular: Negative.   Gastrointestinal: Negative.   Endocrine: Negative.   Genitourinary: Negative.   Musculoskeletal: Negative.   Skin: Negative.   Allergic/Immunologic: Negative.   Neurological: Negative.   Hematological: Negative.   Psychiatric/Behavioral: Negative.     Exam:   BP 110/62 (BP Location: Right Arm, Patient Position: Sitting, Cuff Size: Large)   Pulse 90   Temp 97.6 F (36.4 C) (Temporal)   Resp 14   Ht 5' 4"  (1.626 m)   Wt 223 lb 8 oz (101.4 kg)   BMI 38.36 kg/m     General appearance: alert, cooperative and appears stated age Head: normocephalic, without obvious abnormality, atraumatic Neck: no adenopathy, supple, symmetrical, trachea midline and thyroid normal to inspection and palpation Lungs: clear to auscultation bilaterally Breasts: normal appearance, no masses or tenderness, No nipple retraction or dimpling,  No nipple discharge or bleeding, No axillary adenopathy Heart: regular rate and rhythm Abdomen: soft, non-tender; no masses, no organomegaly Extremities: extremities normal, atraumatic, no cyanosis or edema Skin: skin color, texture, turgor normal. No rashes or lesions Lymph nodes: cervical, supraclavicular, and axillary nodes normal. Neurologic: grossly normal  Pelvic: External genitalia:  no lesions              No abnormal inguinal nodes palpated.              Urethra:  normal appearing urethra with no masses, tenderness or lesions              Bartholins and Skenes: normal                 Vagina: normal appearing vagina with normal color and discharge, no lesions              Cervix: no lesions              Pap taken: Yes.   Bimanual Exam:  Uterus:  normal size, contour, position, consistency, mobility, non-tender              Adnexa: no mass, fullness, tenderness              Rectal exam: Yes.  .  Confirms.              Anus:  normal sphincter tone, no lesions  Chaperone was present for exam.  Assessment:   Well woman visit with normal exam. Mirena IUD. Hx LEEP. Hx osteopenia.  FH metastatic breast cancer. Mother.  Crohn's.  On immunosuppressive medication.  Hx DVT/PE.  On Eliquis.  IVC filter removed. MS. Bereavement.   Plan: Mammogram screening discussed. Self breast awareness reviewed. Pap and HR HPV as above. Guidelines for Calcium, Vitamin D, regular exercise program including cardiovascular and weight bearing exercise. Decline Gardasil.  Support given for the loss of her mother.  Follow up annually and prn.  After visit summary provided.

## 2019-04-27 ENCOUNTER — Other Ambulatory Visit (HOSPITAL_COMMUNITY)
Admission: RE | Admit: 2019-04-27 | Discharge: 2019-04-27 | Disposition: A | Discharge status: Home / Self Care | Payer: 59 | Source: Ambulatory Visit | Attending: Obstetrics and Gynecology | Admitting: Obstetrics and Gynecology

## 2019-04-27 ENCOUNTER — Ambulatory Visit (INDEPENDENT_AMBULATORY_CARE_PROVIDER_SITE_OTHER): Payer: 59 | Admitting: Obstetrics and Gynecology

## 2019-04-27 ENCOUNTER — Other Ambulatory Visit: Payer: Self-pay

## 2019-04-27 ENCOUNTER — Encounter: Payer: Self-pay | Admitting: Obstetrics and Gynecology

## 2019-04-27 VITALS — BP 110/62 | HR 90 | Temp 97.6°F | Resp 14 | Ht 64.0 in | Wt 223.5 lb

## 2019-04-27 DIAGNOSIS — Z01419 Encounter for gynecological examination (general) (routine) without abnormal findings: Secondary | ICD-10-CM | POA: Diagnosis not present

## 2019-04-27 NOTE — Patient Instructions (Signed)

## 2019-05-02 LAB — CYTOLOGY - PAP
Diagnosis: NEGATIVE
HPV: NOT DETECTED

## 2019-05-16 ENCOUNTER — Other Ambulatory Visit: Payer: Self-pay

## 2019-05-16 DIAGNOSIS — F325 Major depressive disorder, single episode, in full remission: Secondary | ICD-10-CM

## 2019-05-16 DIAGNOSIS — G35 Multiple sclerosis: Secondary | ICD-10-CM

## 2019-05-16 NOTE — Progress Notes (Signed)
Pt requests neuropsych testing.

## 2019-05-21 ENCOUNTER — Encounter: Payer: Self-pay | Admitting: Family Medicine

## 2019-05-21 DIAGNOSIS — E559 Vitamin D deficiency, unspecified: Secondary | ICD-10-CM

## 2019-05-21 DIAGNOSIS — G35 Multiple sclerosis: Secondary | ICD-10-CM

## 2019-05-21 DIAGNOSIS — E781 Pure hyperglyceridemia: Secondary | ICD-10-CM

## 2019-05-21 DIAGNOSIS — E538 Deficiency of other specified B group vitamins: Secondary | ICD-10-CM

## 2019-05-21 DIAGNOSIS — K5000 Crohn's disease of small intestine without complications: Secondary | ICD-10-CM

## 2019-05-21 DIAGNOSIS — M8580 Other specified disorders of bone density and structure, unspecified site: Secondary | ICD-10-CM

## 2019-06-13 ENCOUNTER — Other Ambulatory Visit: Payer: 59

## 2019-06-20 ENCOUNTER — Encounter: Payer: Self-pay | Admitting: Family Medicine

## 2019-06-25 ENCOUNTER — Other Ambulatory Visit: Payer: Self-pay | Admitting: Family Medicine

## 2019-06-25 DIAGNOSIS — F325 Major depressive disorder, single episode, in full remission: Secondary | ICD-10-CM

## 2019-06-26 ENCOUNTER — Telehealth: Payer: Self-pay | Admitting: Neurology

## 2019-06-26 NOTE — Telephone Encounter (Signed)
Pt has upcoming appt

## 2019-06-26 NOTE — Telephone Encounter (Signed)
Called and left message for pt to call back to schedule 6 month follow-up from 12/2018 visit

## 2019-06-26 NOTE — Telephone Encounter (Signed)
Rhiannon from [facility] needs to speak to some one about patient prior auth for the infusion that is sch for 07-01-19. She states that it has to be done by patient insurance which is [facility] please call

## 2019-06-26 NOTE — Telephone Encounter (Signed)
Pt has an appt coming up

## 2019-06-26 NOTE — Telephone Encounter (Signed)
Patient left a message on the VM stating that she needs [place] to do a urgent verbal prior auth on her medication ampyra. She would like to speak to someone about this please call

## 2019-06-26 NOTE — Telephone Encounter (Signed)
Left message for pt to call back to schedule 6 month follow-up from the 12/2018 visit

## 2019-06-27 NOTE — Telephone Encounter (Signed)
Called Ocrevus back concerning patient PA.the call was to make sure that patient PA was done under [facility]. Reviewed patient chart there is a scan document regarding this. Do not see where a PA was completed for this infusion.  Will move forward with getting PA

## 2019-06-27 NOTE — Telephone Encounter (Signed)
Called spoke [place] C.to start PA for Ocrevus REF# [id number] approval within 15 days requested to expedite Approval due to patient appt for infusion on 07/02/19 @ 8:00 am.   [phone]

## 2019-06-28 ENCOUNTER — Telehealth: Payer: Self-pay | Admitting: Neurology

## 2019-06-28 NOTE — Telephone Encounter (Signed)
Patient needs to talk to someone about the Prior Auth that is needed for her infusion on Monday please call

## 2019-06-29 ENCOUNTER — Encounter: Payer: Self-pay | Admitting: *Deleted

## 2019-06-29 NOTE — Telephone Encounter (Signed)
Followed up on prior telephone note 8/26 when PA started for Ocrevus see if approved. Patient has appt at [facility] short stay Monday am at 0800 for Ocrevus and the PA has not come through. Called and was told to call another number at [facility]. Called was unable to speak to a human regarding this PA. Will call again Monday am.  Called patient and made her aware that her Monday am appt needs to be canceled as PA has not yet come back. Called and spoke to [place] at [facility] and she cancelled appt. Informed patient we will continue to follow up on this and get her rescheduled asap once approved.  Now will begin PA for Ampyra med that patient called about and needs.

## 2019-06-29 NOTE — Progress Notes (Signed)
Lindsay Mccarthy Key: [id number] - PA Case ID: [id number] Need help? Call us at [phone] Status Sent to [facility] Dalfampridine ER 10MG er tablets Form [facility] Electronic Prior Authorization Form (2017 NCPDP)

## 2019-07-02 ENCOUNTER — Ambulatory Visit (HOSPITAL_COMMUNITY): Admission: RE | Admit: 2019-07-02 | Payer: 59 | Source: Ambulatory Visit

## 2019-07-02 ENCOUNTER — Telehealth: Payer: Self-pay | Admitting: *Deleted

## 2019-07-02 NOTE — Telephone Encounter (Signed)
Called patient and informed her that her Ampyra PA has been approved. It is approved for 12 months through 06/28/20. Will scan [facility] PA form into chart

## 2019-07-02 NOTE — Telephone Encounter (Signed)
Is this the patient you stated that Laureen Ochs did PA on last Friday? Can you document this please Thanks,Apple

## 2019-07-02 NOTE — Telephone Encounter (Signed)
Request Reference Number: [id number]. DALFAMPRIDIN TAB 10MG ER is approved through 06/28/2020. For further questions, call [phone].  Ampyra approval above (copied from Cover My Meds)  Patient called and made aware.

## 2019-07-03 ENCOUNTER — Encounter: Payer: Self-pay | Admitting: *Deleted

## 2019-07-03 ENCOUNTER — Telehealth: Payer: Self-pay | Admitting: *Deleted

## 2019-07-03 NOTE — Telephone Encounter (Signed)
Received fax and call from [facility] in regards to [facility] PA.  [place] staff assisted with PA previously and our number and fax was on file.  Representative Anderson Malta called requesting clinical information for PA.  She was given [facility] phone number a few days ago.  She left a message with staff but has not heard back.  She is now unable to leave a message.  She is requesting a call back at [phone] and her direct extension is [phone].   Mariella Saa, PharmD, [place], [place] Clinical Specialty Pharmacist [phone]  07/03/2019 1:44 PM

## 2019-07-03 NOTE — Telephone Encounter (Addendum)
Called Amber at [facility] (see message/her phone number below) and discussed what is still needing to be done for approval for Ocrevus.  Have called patient to come into office to sign for release to send [facility] the office notes they requested. She will come in tomorrow.  Amber will call back tomorrow and will be working with [place] in the office to get all the needed information to [facility] for approval.  Did speak with Anderson Malta representative yesterday and she told me what information is needed and this will be faxed to her tomorrow at the fax number she gave me [phone] once patient signs release.

## 2019-07-03 NOTE — Telephone Encounter (Addendum)
[place] [phone] ext  [phone] again regarding Ocrevus infusion.   They stated to go to [email or web address]   Patient was made aware our office is working on getting the PA done and Ocrevus rescheduled.   Patient is coming into the office to sign a release for [place] to send office notes to [facility].  Patient stated that if it is easier to get approved then she would be willing to have the infusions done at home. She currently has her Crohn medicine infusion done at home [facility].

## 2019-07-04 ENCOUNTER — Telehealth: Payer: Self-pay | Admitting: *Deleted

## 2019-07-04 NOTE — Telephone Encounter (Signed)
Still trying to gather information for patient's Ocrevus approval (see prior telephone notes). Spoke again to [place] from [facility] at [phone] Ext [phone] . Patient came into office and signed release form for me to be able to send office notes and letter from Dr. Tomi Likens to her fax at [phone]. Fax confirmation received.  Anderson Malta stated patient was given a grace period for having her first infusion at infusion center now it needs to be at home per insurance. They will not approve it for in an outpatient center. I was given the number [phone] of [facility] to ask if they administer this med at home . Another direct line is [phone].  Patient currently receives another medication through this company and wanted to try and see if they could give Ocrevus also at home.   Spoke to [facility] and they need the following faxed to them at their fax [phone]: - patient diagnosis - order for De Nurse - MD office notes  Release already signed this am for this company for me to send records.  Apolonio Schneiders with [place] [phone] followed up today and I informed her have sent requested information to insurance and if they agree she can have in an outpatient setting after reviewing her information I will let her know. Otherwise will make MD aware and send information/order to home infusion for their decision.  When patient came to office today to sign release all the above was explained to her and told her I would let her know as soon as I hear where she is approved for infusion. Verbalized understanding.

## 2019-07-06 ENCOUNTER — Telehealth: Payer: Self-pay | Admitting: *Deleted

## 2019-07-06 ENCOUNTER — Telehealth: Payer: Self-pay | Admitting: Neurology

## 2019-07-06 NOTE — Telephone Encounter (Signed)
Anderson Malta from [facility] called requesting to speak with Jonna Clark, RN. She is aware Manuela Schwartz is working on getting the tax ID number needed.

## 2019-07-06 NOTE — Telephone Encounter (Signed)
Call received from Dr. Richelle Ito with [facility] regarding De Nurse stating the medication was approved but NOT the infusion site.   Will proceed to fax information to home care agency.  Will fax order/office notes/demographic to their fax [phone]: - patient diagnosis - order for De Nurse - MD office notes  Called [facility] 1 [phone]  For order form for Ocrevus for Dr. Tomi Likens to fill out to be faxed. They will fax order form to our fax. At that point will be filled out by MD and sent to the home care   [facility] and let her know above.

## 2019-07-06 NOTE — Telephone Encounter (Signed)
Message from answering service:  Caller states she is calling from [facility]. She needs to speak to Jonna Clark, RN about prior authorization number.

## 2019-07-09 ENCOUNTER — Encounter: Payer: Self-pay | Admitting: Family Medicine

## 2019-07-10 ENCOUNTER — Telehealth: Payer: Self-pay | Admitting: *Deleted

## 2019-07-10 NOTE — Telephone Encounter (Signed)
Call received from Amy at [facility] [phone] Ext [phone].   The home infusion for Ocrevus is APPROVED.  Reference # for approval is [id number] and is approved for dates 07/10/19 - 07/09/20.   Letter has been sent from insurance to patient with next steps. Called Lindsay Mccarthy (patient) and made her aware it has been approved. Informed patient she will be receiving a letter but if there is anything else she needs from [place] to please let our office know. Verbalized understanding.

## 2019-07-10 NOTE — Telephone Encounter (Signed)
Trying to get Tax ID for [facility] to give to insurance company for [employer] approval at home.  Call placed to [phone] and received TAX ID NUMBER for [facility] home infusion is:  [id number]   Call returned to [place] nurse who called [phone] Ext [phone] to make her aware of tax ID above. Voice mail left on her secure voice mail with tax ID.   Had faxed all requested documents 9/4 to [facility] including Ocrevus order/ office notes/ demographics. These were faxed to [phone].

## 2019-07-11 ENCOUNTER — Encounter: Payer: Self-pay | Admitting: *Deleted

## 2019-07-11 NOTE — Progress Notes (Signed)
This is a duplicate documentation (see telephone note dated 07/11/19). Ocrevus approved for home use as of yesterday. Approval 07/10/19- 07/09/20.  Reference # [id number]  Patient also aware and will be receiving letter from [facility] regarding next steps for infusion.

## 2019-07-12 ENCOUNTER — Encounter: Payer: Self-pay | Admitting: Dietician

## 2019-07-12 ENCOUNTER — Encounter: Payer: 59 | Attending: Family Medicine | Admitting: Dietician

## 2019-07-12 ENCOUNTER — Other Ambulatory Visit: Payer: Self-pay

## 2019-07-12 ENCOUNTER — Other Ambulatory Visit (HOSPITAL_COMMUNITY): Payer: Self-pay | Admitting: General Practice

## 2019-07-12 ENCOUNTER — Telehealth: Payer: Self-pay | Admitting: *Deleted

## 2019-07-12 DIAGNOSIS — K5000 Crohn's disease of small intestine without complications: Secondary | ICD-10-CM | POA: Insufficient documentation

## 2019-07-12 DIAGNOSIS — Z713 Dietary counseling and surveillance: Secondary | ICD-10-CM | POA: Diagnosis present

## 2019-07-12 DIAGNOSIS — E781 Pure hyperglyceridemia: Secondary | ICD-10-CM | POA: Diagnosis present

## 2019-07-12 NOTE — Telephone Encounter (Signed)
Called patient to see if she had heard from the home care company regarding setting up appt for Ocrevus infusion. (See prior note that it has been approved for home infusion). Patient has not received the letter yet that [facility] is sending to patient but she is going to call them tomorrow (she is a current patient as receives another medication from that company).  Explained to patient in the forms I submitted for the prior auth it was checked the Ocrevus 66m was the medication MD was ordering. The full order sheet with the premedications to be given prior to Ocrevus has not been provided yet to this office to complete.  Asked patient when she talks to the home care company to please have them contact [place] the full order of medication/premeds can be submitted. Patient verbalized understanding.

## 2019-07-12 NOTE — Progress Notes (Signed)
Medical Nutrition Therapy:  Appt start time: 9244 end time:  1140.   Assessment:  Primary concerns today: Patient is here today alone.  She would like better ideas of what she can incorporate into her plan to help with the MS.  She is not sure how these can work with the crohn's. She was referred for hypertriglyceridemia, dyslipidemia, obesity, vitamin B-12 deficiency, vitamin D deficiency, Crohn's disease of small intestine, osteopenia, and MS.  Chronic anemia, depression. 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  She does have dumping syndrome if she is not careful with her diet. 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  Labs include:  12/15/2018:  Cholesterol 116, HDL 57, LDL 62, Triglycerides:  203, vitamin B-12 366, vitamin D 21 (09/12/18)  Weight hx: She states that she fought obesity in the past since she was 15.  She tried Weight Watcher's in the past and loses the weight and then something happens (depressions, death of family, new diagnosis, steroids due to crohn's) and regains the weight. 233 lbs today which is her highest adult weight 158 lbs lowest adult weight at age 43 States her husband is supportive of any weight that she is.  States that her husband wants to lose weight. She would like to lose weight but improving her health is her first priority. She has just made an appointment to see a counselor and states that she saw this person for about 6 months last year and lost 15 pounds as a result.  Patient lives with her husband and 2 dogs and a cat.  She does the shopping and cooking.  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  Moved from [place] to [place] in 2013. She enjoys paint-by-number, riding her motorcycle, cooking, and TV. She does meal planning weekly.  Preferred Learning Style:   No preference indicated   Learning Readiness:   Ready  MEDICATIONS: see list to include vitamin d (3000 units daily), vitamin B-12 once per  month.  She had tried fish oil in the past but increased reflux and increased oily stools. She does not think that she has gained weight from her medication. DIETARY INTAKE:  Usual eating pattern includes 3 meals and 2 snacks per day. "Snacks a lot.  You name it I will eat it.  I prefer sweets." Avoided foods include salad, raw vegetables, raw fruits, dairy (lactose intolerant), high fat foods (does see oil in the toilet at times), nuts, alcohol.  24-hr recall:  B ( AM): Rolled oats, truvia, banana, cinnamon, almond milk  Snk ( AM): none L ( PM): sandwich or hotdog or leftovers Snk ( PM): pudding made with skim milk, trail mix, chips, candy if available D ( PM): spaghetti or lasagna, cooked vegetables OR meat, potatoes, vegetables  Snk ( PM):  pudding made with skim milk, trail mix, chips, candy if available Beverages: water, 1 diet soda per day, 2-3 cups coffee (black)  Usual physical activity: limited due to MS, falls a lot, occasionally does a treadmill, has a pool where she lives but has to be careful about sun with her medication and does not like to wear bathing suits.  She also has a heat sensitivity.  [facility] [employer] [place].  Progress Towards Goal(s):  In progress.   Nutritional Diagnosis:  NB-1.1 Food and nutrition-related knowledge deficit As related to nutrition for hypertriglyceridemia, crohn's and balance of nutrition.  As evidenced by diet hx and patient report.  Intervention:  Nutrition counseling/education related to increasing nutrients in her diet as well as nutrition tolerance.  Discussed tips to lower her triglycerides (primarily by decreasing her sugar intake and avoiding emotional eating).  Discussed the addictive nature of food and alternatives to the foods that she is currently eating.  Discussed mindfulness and finding other things to do rather than eat when not hungry.    Plan: Consider increasing your vitamin D to 5000 units daily. Consider vitamin K-2  (150 mg)  (This can be found in a supplements along with vitamin D).  Ways to increase your nutrition: -Juice Plus could be a consideration (or similar product). -Juicing (removes fiber but maintains other nutrients).   -Smoothie:  1/2-1 banana, 1/2-1 cup mango, strawberries, peaches and or kiwi, cantaloupe, greens (optional as tolerated), almond milk or water.  Limit Saturated fat and refined sugar.  Mindfulness: 3 Meals per day.  Include plenty of nutrition rich foods as you tolerate. Eat slowly and stop when you are satisfied. Between meals: If you are hungry have a small snack.  If you are not hungry, what can you do instead?- paint, puzzle, stretch, TV. Avoid eating in front of the computer of TV.    Think of your Bright Lines with food tolerance.  Counseling is a great idea.  Teaching Method Utilized:  Visual Auditory  Handouts given during visit include:  Snack list  Cholesterol and triglyceries  Barriers to learning/adherence to lifestyle change: MS, crohn's, depression/stress  Demonstrated degree of understanding via:  Teach Back   Monitoring/Evaluation:  Dietary intake, exercise, and body weight in 1 month(s).

## 2019-07-12 NOTE — Patient Instructions (Addendum)
Consider increasing your vitamin D to 5000 units daily. Consider vitamin K-2 (150 mg)  (This can be found in a supplements along with vitamin D).  Ways to increase your nutrition: -Juice Plus could be a consideration (or similar product). -Juicing (removes fiber but maintains other nutrients).   -Smoothie:  1/2-1 banana, 1/2-1 cup mango, strawberries, peaches and or kiwi, cantaloupe, greens (optional as tolerated), almond milk or water.  Limit Saturated fat and refined sugar.  Mindfulness: 3 Meals per day.  Include plenty of nutrition rich foods as you tolerate. Eat slowly and stop when you are satisfied. Between meals: If you are hungry have a small snack.  If you are not hungry, what can you do instead?- paint, puzzle, stretch, TV. Avoid eating in front of the computer of TV.    Think of your Bright Lines with food tolerance.  Counseling is a great idea.

## 2019-07-15 ENCOUNTER — Other Ambulatory Visit: Payer: Self-pay | Admitting: Family Medicine

## 2019-07-15 DIAGNOSIS — E781 Pure hyperglyceridemia: Secondary | ICD-10-CM

## 2019-07-16 NOTE — Telephone Encounter (Signed)
Pt has upcoming appt

## 2019-07-17 ENCOUNTER — Other Ambulatory Visit: Payer: Self-pay | Admitting: Family Medicine

## 2019-07-17 ENCOUNTER — Other Ambulatory Visit: Payer: Self-pay | Admitting: Medical

## 2019-07-17 DIAGNOSIS — F325 Major depressive disorder, single episode, in full remission: Secondary | ICD-10-CM

## 2019-07-17 DIAGNOSIS — Z7901 Long term (current) use of anticoagulants: Secondary | ICD-10-CM | POA: Insufficient documentation

## 2019-07-17 NOTE — Telephone Encounter (Signed)
This was last filled for #30 on 8/25.  She has appt tomorrow.  She shouldn't need this before her appt tomorrow (unless pt states otherwise for some other reason).

## 2019-07-17 NOTE — Telephone Encounter (Signed)
Pt has an appt tomorrow and this was refilled on 8/25

## 2019-07-17 NOTE — Progress Notes (Signed)
Chief Complaint  Patient presents with  . Follow-up    6 month follow up, no new concerns.     Patient presents for 6 month follow-up.  Depression follow-up:She remains on Lexapro 7m daily and WellbutrinSR1055mBID. Moods are well controlled. Seeing a counselor since late last year.  Now only going as needed, about once a month or less. She hasn't gone in a couple of months, but has an appointment scheduled.  Husband feels she is crankier than usual, she doesn't think so. She is compliant with her meds, and didn't tolerate the higher dose of wellbutrin in the past.  Vitamin D deficiency--found to be low at 21 in 09/2018, checked by Dr. JaTomi Likens She was advised to increase supplement to 4000 IU daily and recheck is due in May. She has not yet had this rechecked (ended up having virtual visit instead).  She saw nutritionist last week, who suggested she take 5000 IU daily.  Pulmonary nodule LLL, noted on CT in 07/2018: IMPRESSION: 1. Postsurgical changes at the terminal ileum and proximal colon without acute gastrointestinal abnormality. 2. Left lower lobe pulmonary nodule measuring up to 8 mm. Non-contrast chest CT at 6-12 months is recommended. If the nodule is stable at time of repeat CT, then future CT at 18-24 months (from today's scan) is considered optional for low-risk patients, but is recommended for high-risk patients. This recommendation follows the consensus statement: Guidelines for Management of Incidental Pulmonary Nodules Detected on CT Images: From the Fleischner Society 2017; Radiology 2017; 284:228-243.   She recalls having h/o pulmonary nodule in the past that was monitored regularly, and was stable.  [place] This has not yet been repeated.   Crohn's disease:  On Entyvio per Dr. GeCarlean PurlShe last saw him in March, scheduled again for October. She is s/p ferraheme transfusion.  Last CBC was  improved, ferritin 67.  Dr. GeCarlean Purlanted it a little higher, recommended oral iron supplements as tolerated. He had recommended rechecking labs early June, which wasn't done. She tried taking iron, but it slowed down her bowels, and she noticed blood in the stools, so stopped. Lab Results  Component Value Date   WBC 9.7 01/30/2019   HGB 13.3 01/30/2019   HCT 40.0 01/30/2019   MCV 87.3 01/30/2019   PLT 383.0 01/30/2019   Lab Results  Component Value Date   FERRITIN 67.0 01/30/2019   She is doing well with respect to her Crohn's, bowels are good.  Uses colestipol, titrated to control bowels (only sporadically, every week or two, as her bowels have been pretty good, and taking it more often makes her constipated).  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 She denies bleeding (only when taking iron supplements), bruising. Denies shortness of breath, chest pain. No longer has swelling, no longer uses compression stockings.  Hypertriglyceridemia:  She is compliant with medications (fenofibrate) and denies side effects. TG were elevated at 203 on last check in February.  She is due for recheck.  She had reported in recent message that she hadn't lost any weight or made strides to improve eating, so preferred to hold off on repeat testing. She recently met with nutritionist, is working on diet, and prefers to hold off on rechecking this for a bit longer. Having more smoothies as snacks, rather than junk.  No longer eating after dinner snacks. Tries not to keep much around, but she will  eat whatever is around. Has a list of better snacks to have on hand. Has f/u scheduled with nutritionist next month.  Lab Results  Component Value Date   CHOL 160 12/15/2018   HDL 57 12/15/2018   LDLCALC 62 12/15/2018   TRIG 203 (H) 12/15/2018   CHOLHDL 2.8 12/15/2018    MS:  She is under the care of Dr. Tomi Likens.  She is being treated with Ampyra, gabapentin, and Ocrevus infusions (due  now, an issue in getting medication approved, but is being taken care of). Her next appt is in November.   She hasn't been exercising regularly. 6-8 weeks ago she started having more MS symptoms--tripping more, more hip pain (left).  Hurts with standing, or even walking for 10 minutes.  If she pushes it, she gets more pings in her feet, restless legs all night.   PMH, PSH, SH reviewed  Outpatient Encounter Medications as of 07/18/2019  Medication Sig Note  . apixaban (ELIQUIS) 5 MG TABS tablet TAKE 1 TABLET(5 MG) BY MOUTH TWICE DAILY   . Biotin 10000 MCG TABS Take by mouth.   Marland Kitchen buPROPion (WELLBUTRIN SR) 100 MG 12 hr tablet Take 1 tablet (100 mg total) by mouth 2 (two) times daily.   . cetirizine (ZYRTEC) 10 MG tablet Take 10 mg by mouth at bedtime.    . Cholecalciferol (VITAMIN D3) 2000 units TABS Take 3,000 Units by mouth every evening.  12/20/2018: Taking 1 in the morning and 2 at night  . cyanocobalamin (,VITAMIN B-12,) 1000 MCG/ML injection Inject 1000 mcg weekly x 4 then monthly 12/20/2018: monthly  . dalfampridine (AMPYRA) 10 MG TB12 Take 1 tablet (10 mg total) by mouth 2 (two) times daily.   . diphenhydrAMINE (BENADRYL) 25 MG tablet Take 25 mg by mouth at bedtime as needed for sleep.   Marland Kitchen escitalopram (LEXAPRO) 10 MG tablet TAKE 1 TABLET(10 MG) BY MOUTH AT BEDTIME   . fenofibrate 160 MG tablet TAKE 1 TABLET(160 MG) BY MOUTH AT BEDTIME   . gabapentin (NEURONTIN) 100 MG capsule TAKE 3 CAPSULES(300 MG) BY MOUTH AT BEDTIME   . levonorgestrel (MIRENA) 20 MCG/24HR IUD 1 each by Intrauterine route once. Implanted April 2017   . ocrelizumab 600 mg in sodium chloride 0.9 % 500 mL Inject 600 mg into the vein every 6 (six) months. 07/18/2019: Ocrevus Infusion q 6 months  . vedolizumab (ENTYVIO) 300 MG injection Inject 300 mg into the vein every 8 (eight) weeks.   . [DISCONTINUED] buPROPion (WELLBUTRIN SR) 100 MG 12 hr tablet TAKE 1 TABLET(100 MG) BY MOUTH TWICE DAILY   . [DISCONTINUED] ELIQUIS 5 MG  TABS tablet TAKE 1 TABLET(5 MG) BY MOUTH TWICE DAILY   . [DISCONTINUED] escitalopram (LEXAPRO) 10 MG tablet TAKE 1 TABLET(10 MG) BY MOUTH AT BEDTIME   . [DISCONTINUED] ocrelizumab 600 mg in sodium chloride 0.9 % 500 mL Inject 600 mg into the vein once. 07/18/2019: Every 6 months  . acetaminophen (TYLENOL) 500 MG tablet Take 1,000 mg by mouth every 6 (six) hours as needed for headache (pain). Reported on 12/15/2015   . colestipol (COLESTID) 5 g packet TAKE 5 GRAMS BY MOUTH DAILY (Patient not taking: Reported on 07/18/2019) 12/20/2018: Takes 2-3 times/week  . OVER THE COUNTER MEDICATION Apply 1 application topically daily as needed (arthritis pain). CBD Oil   . [DISCONTINUED] fenofibrate 160 MG tablet TAKE 1 TABLET(160 MG) BY MOUTH AT BEDTIME    No facility-administered encounter medications on file as of 07/18/2019.    Allergies  Allergen  Reactions  . Ibuprofen Other (See Comments)    Avoids due to Crohns disease  . Morphine And Related Itching    itchy  . Prednisone Other (See Comments)    "crazy", hallucinations Tolerates methylprednisolone  . Adhesive [Tape] Rash    Rash with electrodes    ROS: no fever, chills, headaches, dizziness, chest pain, shortness of breath, URI symptoms. No nausea, vomiting, heartburn, abdominal pain.  Bowels are controlled. Denies urinary complaints.  Denies bleeding, bruising, rash. Some irritability. See HPI +weight gain (20# since May)  ASSESSMENT/PLAN:  BP 122/72   Pulse 84   Temp 97.8 F (36.6 C) (Other (Comment))   Ht 5' 4"  (1.626 m)   Wt 234 lb (106.1 kg)   BMI 40.17 kg/m    Wt Readings from Last 3 Encounters:  07/12/19 233 lb (105.7 kg)  04/27/19 223 lb 8 oz (101.4 kg)  03/07/19 215 lb 12.8 oz (97.9 kg)   Well-appearing, pleasant, obese female in no distress HEENT: conjunctiva and sclera are clear, EOMI. Wearing mask Neck: no lymphadenopathy or mass Heart: regular rate and rhythm, no murmur Lungs: clear bilaterally Back: no spinal  tenderness.  Mildly tender at left SI joint.  No pyriformis spasm.  No CVA tenderness Abdomen: soft, nontender, no mass Extremities: no edema, normal pulses.  nontender at trochanteric bursa on left Skin: normal turgor, no rash or bruising. Psych: normal mood, affect, hygiene and grooming  PHQ-9 score of 7   ASSESSMENT/PLAN:  Hypertriglyceridemia - counseled re: diet in detail. Cont fenofibrate.  Recheck at CPE in Feb (will have insurance labs done soon also)  Vitamin D deficiency - due for recheck. - Plan: VITAMIN D 25 Hydroxy (Vit-D Deficiency, Fractures)  Vitamin B12 deficiency - on monthly B12 shots, last given 9/1. - Plan: Vitamin B12  Iron deficiency anemia due to chronic blood loss - due for recheck. no recent bleeding, didn't tolerate iron (caused rectal bleeding) - Plan: CBC with Differential/Platelet, Vitamin B12, Ferritin  Major depressive disorder in partial remission, unspecified whether recurrent (HCC) - PHQ-9 score of 7.  Agree with counseling, as planned. If not improving, to f/u to discuss med changes.  - Plan: buPROPion (WELLBUTRIN SR) 100 MG 12 hr tablet, escitalopram (LEXAPRO) 10 MG tablet  Pulmonary nodule - due for recheck. Pt prefers to wait to discuss next month with Dr. Sherre Lain see if he will order abd CT (vs me ordering noncontrast chest CT)  Multiple sclerosis ([place]) - stable.  Due for infusion  Crohn's disease of small intestine without complication (HCC) - stable on current regimen  Long-term use of immunosuppressant medication Entyvio + ocrelizumab  Anticoagulant long-term use - Plan: CBC with Differential/Platelet, apixaban (ELIQUIS) 5 MG TABS tablet  Medication monitoring encounter - Plan: VITAMIN D 25 Hydroxy (Vit-D Deficiency, Fractures), CBC with Differential/Platelet, Comprehensive metabolic panel, Vitamin O75, Ferritin  History of pulmonary embolus (PE) - continue anticoagulants - Plan: apixaban (ELIQUIS) 5 MG TABS tablet  Need for influenza  vaccination - Plan: Flu Vaccine QUAD 6+ mos PF IM (Fluarix Quad PF)   She hadn't seen Dr. Celesta Aver response to her questions about timing of her infusions--in error reply was sent to nurse.  She was advised of the response.   Vitamin D level. C-met Hold off on lipids per pt Dr. Carlean Purl had suggested getting labs repeated in early June, so recommend doing CBC and ferritin today (and will forward to him). Also check B12 (h/o low)--last shot was 9/1.  Flu shot given  Due for noncontrast  chest CT to f/u LLL nodule.--wants to double check with Dr. Carlean Purl and see when next CT is due, which will also look at this area. She wants another CT done.   Send copies of labs to Dr. Carlean Purl and Tomi Likens

## 2019-07-18 ENCOUNTER — Encounter: Payer: Self-pay | Admitting: Family Medicine

## 2019-07-18 ENCOUNTER — Ambulatory Visit (INDEPENDENT_AMBULATORY_CARE_PROVIDER_SITE_OTHER): Payer: 59 | Admitting: Family Medicine

## 2019-07-18 ENCOUNTER — Other Ambulatory Visit: Payer: Self-pay

## 2019-07-18 VITALS — BP 122/72 | HR 84 | Temp 97.8°F | Ht 64.0 in | Wt 234.0 lb

## 2019-07-18 DIAGNOSIS — Z86711 Personal history of pulmonary embolism: Secondary | ICD-10-CM

## 2019-07-18 DIAGNOSIS — E781 Pure hyperglyceridemia: Secondary | ICD-10-CM | POA: Diagnosis not present

## 2019-07-18 DIAGNOSIS — E559 Vitamin D deficiency, unspecified: Secondary | ICD-10-CM

## 2019-07-18 DIAGNOSIS — R911 Solitary pulmonary nodule: Secondary | ICD-10-CM

## 2019-07-18 DIAGNOSIS — D500 Iron deficiency anemia secondary to blood loss (chronic): Secondary | ICD-10-CM

## 2019-07-18 DIAGNOSIS — Z23 Encounter for immunization: Secondary | ICD-10-CM | POA: Diagnosis not present

## 2019-07-18 DIAGNOSIS — G35 Multiple sclerosis: Secondary | ICD-10-CM

## 2019-07-18 DIAGNOSIS — K5000 Crohn's disease of small intestine without complications: Secondary | ICD-10-CM

## 2019-07-18 DIAGNOSIS — Z7960 Long term (current) use of unspecified immunomodulators and immunosuppressants: Secondary | ICD-10-CM

## 2019-07-18 DIAGNOSIS — E538 Deficiency of other specified B group vitamins: Secondary | ICD-10-CM | POA: Diagnosis not present

## 2019-07-18 DIAGNOSIS — Z79899 Other long term (current) drug therapy: Secondary | ICD-10-CM

## 2019-07-18 DIAGNOSIS — F324 Major depressive disorder, single episode, in partial remission: Secondary | ICD-10-CM

## 2019-07-18 DIAGNOSIS — Z5181 Encounter for therapeutic drug level monitoring: Secondary | ICD-10-CM

## 2019-07-18 DIAGNOSIS — Z7901 Long term (current) use of anticoagulants: Secondary | ICD-10-CM

## 2019-07-18 MED ORDER — APIXABAN 5 MG PO TABS: ORAL_TABLET | ORAL | 5 refills | Status: DC

## 2019-07-18 MED ORDER — ESCITALOPRAM OXALATE 10 MG PO TABS: ORAL_TABLET | ORAL | 5 refills | Status: DC

## 2019-07-18 MED ORDER — BUPROPION HCL ER (SR) 100 MG PO TB12: 100.0000 mg | ORAL_TABLET | Freq: Two times a day (BID) | ORAL | 5 refills | Status: DC

## 2019-07-18 NOTE — Patient Instructions (Addendum)
Continue your current medications. Continue to work on [employer], working with nutritionist. Try and drink more water, be mindful about eating, eating only when hungry. Try and work in small intervals of exercises, even if done while seated due to hip pain (10-15 minutes at a time).  Ask Dr. Carlean Purl if CT abdomen is being planned--if not, then we should do a noncontrast chest CT to follow up on the left lower lobe nodule.  I will send copies of lab results to Dr. Tomi Likens and Dr. Carlean Purl.  Continue with counseling as planned.  If moods worsen, please let me know.   Mindfulness-Based Stress Reduction Mindfulness-based stress reduction (MBSR) is a program that helps people learn to practice mindfulness. Mindfulness is the practice of intentionally paying attention to the present moment. It can be learned and practiced through techniques such as education, breathing exercises, meditation, and yoga. MBSR includes several mindfulness techniques in one program. MBSR works best when you understand the treatment, are willing to try new things, and can commit to spending time practicing what you learn. MBSR training may include learning about:  How your emotions, thoughts, and reactions affect your body.  New ways to respond to things that cause negative thoughts to start (triggers).  How to notice your thoughts and let go of them.  Practicing awareness of everyday things that you normally do without thinking.  The techniques and goals of different types of meditation. What are the benefits of MBSR? MBSR can have many benefits, which include helping you to:  Develop self-awareness. This refers to knowing and understanding yourself.  Learn skills and attitudes that help you to participate in your own health care.  Learn new ways to care for yourself.  Be more accepting about how things are, and let things go.  Be less judgmental and approach things with an open mind.  Be patient with  yourself and trust yourself more. MBSR has also been shown to:  Reduce negative emotions, such as depression and anxiety.  Improve memory and focus.  Change how you sense and approach pain.  Boost your body's ability to fight infections.  Help you connect better with other people.  Improve your sense of well-being. Follow these instructions at home:   Find a local in-person or online MBSR program.  Set aside some time regularly for mindfulness practice.  Find a mindfulness practice that works best for you. This may include one or more of the following: ? Meditation. Meditation involves focusing your mind on a certain thought or activity. ? Breathing awareness exercises. These help you to stay present by focusing on your breath. ? Body scan. For this practice, you lie down and pay attention to each part of your body from head to toe. You can identify tension and soreness and intentionally relax parts of your body. ? Yoga. Yoga involves stretching and breathing, and it can improve your ability to move and be flexible. It can also provide an experience of testing your body's limits, which can help you release stress. ? Mindful eating. This way of eating involves focusing on the taste, texture, color, and smell of each bite of food. Because this slows down eating and helps you feel full sooner, it can be an important part of a weight-loss plan.  Find a podcast or recording that provides guidance for breathing awareness, body scan, or meditation exercises. You can listen to these any time when you have a free moment to rest without distractions.  Follow your treatment plan  as told by your health care provider. This may include taking regular medicines and making changes to your diet or lifestyle as recommended. How to practice mindfulness To do a basic awareness exercise:  Find a comfortable place to sit.  Pay attention to the present moment. Observe your thoughts, feelings, and  surroundings just as they are.  Avoid placing judgment on yourself, your feelings, or your surroundings. Make note of any judgment that comes up, and let it go.  Your mind may wander, and that is okay. Make note of when your thoughts drift, and return your attention to the present moment. To do basic mindfulness meditation:  Find a comfortable place to sit. This may include a stable chair or a firm floor cushion. ? Sit upright with your back straight. Let your arms fall next to your side with your hands resting on your legs. ? If sitting in a chair, rest your feet flat on the floor. ? If sitting on a cushion, cross your legs in front of you.  Keep your head in a neutral position with your chin dropped slightly. Relax your jaw and rest the tip of your tongue on the roof of your mouth. Drop your gaze to the floor. You can close your eyes if you like.  Breathe normally and pay attention to your breath. Feel the air moving in and out of your nose. Feel your belly expanding and relaxing with each breath.  Your mind may wander, and that is okay. Make note of when your thoughts drift, and return your attention to your breath.  Avoid placing judgment on yourself, your feelings, or your surroundings. Make note of any judgment or feelings that come up, let them go, and bring your attention back to your breath.  When you are ready, lift your gaze or open your eyes. Pay attention to how your body feels after the meditation. Where to find more information You can find more information about MBSR from:  Your health care provider.  Community-based meditation centers or programs.  Programs offered near you. Summary  Mindfulness-based stress reduction (MBSR) is a program that teaches you how to intentionally pay attention to the present moment. It is used with other treatments to help you cope better with daily stress, emotions, and pain.  MBSR focuses on developing self-awareness, which allows you  to respond to life stress without judgment or negative emotions.  MBSR programs may involve learning different mindfulness practices, such as breathing exercises, meditation, yoga, body scan, or mindful eating. Find a mindfulness practice that works best for you, and set aside time for it on a regular basis. This information is not intended to replace advice given to you by your health care provider. Make sure you discuss any questions you have with your health care provider. Document Released: 02/24/2017 Document Revised: 09/30/2017 Document Reviewed: 02/24/2017 Elsevier Patient Education  2020 [employer].

## 2019-07-19 LAB — COMPREHENSIVE METABOLIC PANEL
ALT: 16 IU/L (ref 0–32)
AST: 21 IU/L (ref 0–40)
Albumin/Globulin Ratio: 1.5 (ref 1.2–2.2)
Albumin: 3.7 g/dL — ABNORMAL LOW (ref 3.8–4.8)
Alkaline Phosphatase: 51 IU/L (ref 39–117)
BUN/Creatinine Ratio: 12 (ref 9–23)
BUN: 12 mg/dL (ref 6–24)
Bilirubin Total: <0.2 mg/dL (ref 0.0–1.2)
CO2: 21 mmol/L (ref 20–29)
Calcium: 8.8 mg/dL (ref 8.7–10.2)
Chloride: 103 mmol/L (ref 96–106)
Creatinine, Ser: 1.01 mg/dL — ABNORMAL HIGH (ref 0.57–1.00)
GFR calc Af Amer: 80 mL/min/{1.73_m2} (ref >59)
GFR calc non Af Amer: 69 mL/min/{1.73_m2} (ref >59)
Globulin, Total: 2.5 g/dL (ref 1.5–4.5)
Glucose: 81 mg/dL (ref 65–99)
Potassium: 4.2 mmol/L (ref 3.5–5.2)
Sodium: 138 mmol/L (ref 134–144)
Total Protein: 6.2 g/dL (ref 6.0–8.5)

## 2019-07-19 LAB — CBC WITH DIFFERENTIAL/PLATELET
Basophils Absolute: 0.1 10*3/uL (ref 0.0–0.2)
Basos: 1 %
EOS (ABSOLUTE): 0.1 10*3/uL (ref 0.0–0.4)
Eos: 1 %
Hematocrit: 34.7 % (ref 34.0–46.6)
Hemoglobin: 11.1 g/dL (ref 11.1–15.9)
Immature Grans (Abs): 0 10*3/uL (ref 0.0–0.1)
Immature Granulocytes: 0 %
Lymphocytes Absolute: 1.5 10*3/uL (ref 0.7–3.1)
Lymphs: 15 %
MCH: 28.4 pg (ref 26.6–33.0)
MCHC: 32 g/dL (ref 31.5–35.7)
MCV: 89 fL (ref 79–97)
Monocytes Absolute: 0.8 10*3/uL (ref 0.1–0.9)
Monocytes: 9 %
Neutrophils Absolute: 7.1 10*3/uL — ABNORMAL HIGH (ref 1.4–7.0)
Neutrophils: 74 %
Platelets: 477 10*3/uL — ABNORMAL HIGH (ref 150–450)
RBC: 3.91 x10E6/uL (ref 3.77–5.28)
RDW: 12.6 % (ref 11.7–15.4)
WBC: 9.6 10*3/uL (ref 3.4–10.8)

## 2019-07-19 LAB — VITAMIN B12: Vitamin B-12: 341 pg/mL (ref 232–1245)

## 2019-07-19 LAB — FERRITIN: Ferritin: 7 ng/mL — ABNORMAL LOW (ref 15–150)

## 2019-07-19 LAB — VITAMIN D 25 HYDROXY (VIT D DEFICIENCY, FRACTURES): Vit D, 25-Hydroxy: 31.8 ng/mL (ref 30.0–100.0)

## 2019-07-20 ENCOUNTER — Other Ambulatory Visit: Payer: Self-pay

## 2019-07-20 DIAGNOSIS — D500 Iron deficiency anemia secondary to blood loss (chronic): Secondary | ICD-10-CM

## 2019-07-20 NOTE — Progress Notes (Signed)
Thanks Eve  We will contact her about feraheme again  [facility] - please see if she is ok to do feraheme x 2 per protocol  CBC and ferritin 1 month after second infusion

## 2019-07-27 ENCOUNTER — Other Ambulatory Visit (HOSPITAL_COMMUNITY): Payer: Self-pay | Admitting: General Practice

## 2019-07-30 ENCOUNTER — Telehealth: Payer: Self-pay | Admitting: *Deleted

## 2019-07-30 NOTE — Telephone Encounter (Addendum)
Called [facility] rep Jene Every for next steps as far as getting patient her Ocrevus infusion at home. Prescriber service form/start form was completed and faxed 9/4 - in media tab.  [place] sent letter it is medically necessary and approved the home infusion of Ocrevus - sent to medical records.   Shanon Brow did state that it is arranged for patient to receive this med at home Oct. 5.   Per patient message (My chart) patient wrote in 9/24 asking about orders and MD gave verbal orders for Ocrevus infusion via phone to [phone] [facility].  I called and same number and spoke to [place] at [place] to make sure they have all orders needed for infusion. They connected me to her local branch.  [facility] in [place] branch direct number is [phone]. Spoke to [place] at this branch and have fax that was sent from them with orders that Dr. Tomi Likens signed and I have faxed back to [phone]. Fax confirmation received at [phone] 07/30/19. Left office number with Otila Kluver if they don't receive or if there is anything else they need. Otila Kluver stated they have everything they need and patient is scheduled for Oct. 5.

## 2019-07-31 ENCOUNTER — Ambulatory Visit (HOSPITAL_COMMUNITY)
Admission: RE | Admit: 2019-07-31 | Discharge: 2019-07-31 | Disposition: A | Discharge status: Home / Self Care | Payer: 59 | Source: Ambulatory Visit | Attending: Internal Medicine | Admitting: Internal Medicine

## 2019-07-31 ENCOUNTER — Other Ambulatory Visit: Payer: Self-pay

## 2019-07-31 DIAGNOSIS — D500 Iron deficiency anemia secondary to blood loss (chronic): Secondary | ICD-10-CM | POA: Insufficient documentation

## 2019-07-31 MED ORDER — SODIUM CHLORIDE 0.9 % IV SOLN
INTRAVENOUS | Status: DC | PRN
  Administered 2019-07-31: 09:00:00 250 mL via INTRAVENOUS

## 2019-07-31 MED ORDER — SODIUM CHLORIDE 0.9 % IV SOLN
510.0000 mg | INTRAVENOUS | Status: DC
  Administered 2019-07-31: 09:00:00 510 mg via INTRAVENOUS
  Filled 2019-07-31: qty 17

## 2019-07-31 NOTE — Discharge Instructions (Signed)

## 2019-07-31 NOTE — Progress Notes (Signed)
PATIENT CARE CENTER NOTE  Diagnosis:  Iron deficiency anemia due to chronic blood loss (D50.0)     Provider: Silvano Rusk, MD   Procedure: IV Feraheme    Note: Patient received Feraheme infusion via PIV. Tolerated well with no adverse reaction. Observed patient for 30 minutes post-infusion. Vital signs stable. Discharge instructions given. Patient to come back next week for second Feraheme infusion. Alert, oriented and ambulatory at discharge.

## 2019-08-07 ENCOUNTER — Telehealth: Payer: Self-pay

## 2019-08-07 NOTE — Telephone Encounter (Signed)
Patient's insurance does not want to cover infusions at the hospital.  08/08/19 appt cancelled. Patient advised that the second Feraheme infusion will need to be moved to [facility] infusion.  She is aware that I have faxed a referral and they will work with her insurance and call her to set up an infusion.

## 2019-08-08 ENCOUNTER — Encounter (HOSPITAL_COMMUNITY): Payer: 59

## 2019-08-08 ENCOUNTER — Telehealth: Payer: Self-pay | Admitting: Internal Medicine

## 2019-08-08 DIAGNOSIS — D500 Iron deficiency anemia secondary to blood loss (chronic): Secondary | ICD-10-CM

## 2019-08-08 NOTE — Telephone Encounter (Signed)
Kim from [employer] and said that Amy told her to ask for you to speak about this patient. She asked that you call her back

## 2019-08-08 NOTE — Telephone Encounter (Signed)
Patient's insurance will not cover Feraheme.  They will only cover Ferriecit, Infed, or Venofer. She unfortunately had an infusion on 07/31/19.  I explained that we will help her do an appeal for that when she gets the bill for it, if they don't cover it.  Please advise on the dose and the choice to replace second feraheme

## 2019-08-10 NOTE — Telephone Encounter (Signed)
I am going to hold off for now  Those iron txs are different class of drug   Want to see what 1 dose of feraheme did so do CBC and a ferritin in 1 month and will then decide

## 2019-08-10 NOTE — Telephone Encounter (Signed)
Did you decide on alternate for Feraheme for second dose? Ferriecit, Infed, or Venofer?

## 2019-08-10 NOTE — Telephone Encounter (Signed)
Patient notified of the recommendations She will come for OV end of the month.

## 2019-08-21 ENCOUNTER — Other Ambulatory Visit: Payer: Self-pay

## 2019-08-21 ENCOUNTER — Ambulatory Visit (INDEPENDENT_AMBULATORY_CARE_PROVIDER_SITE_OTHER): Payer: 59 | Admitting: Internal Medicine

## 2019-08-21 ENCOUNTER — Other Ambulatory Visit (INDEPENDENT_AMBULATORY_CARE_PROVIDER_SITE_OTHER): Payer: 59

## 2019-08-21 ENCOUNTER — Encounter: Payer: Self-pay | Admitting: Internal Medicine

## 2019-08-21 VITALS — BP 118/88 | HR 88 | Temp 98.1°F | Ht 64.0 in | Wt 238.0 lb

## 2019-08-21 DIAGNOSIS — K9089 Other intestinal malabsorption: Secondary | ICD-10-CM | POA: Diagnosis not present

## 2019-08-21 DIAGNOSIS — Z7960 Long term (current) use of unspecified immunomodulators and immunosuppressants: Secondary | ICD-10-CM

## 2019-08-21 DIAGNOSIS — R911 Solitary pulmonary nodule: Secondary | ICD-10-CM

## 2019-08-21 DIAGNOSIS — S3013XA Contusion of flank (latus) region, initial encounter: Secondary | ICD-10-CM

## 2019-08-21 DIAGNOSIS — S301XXA Contusion of abdominal wall, initial encounter: Secondary | ICD-10-CM | POA: Insufficient documentation

## 2019-08-21 DIAGNOSIS — R296 Repeated falls: Secondary | ICD-10-CM | POA: Diagnosis not present

## 2019-08-21 DIAGNOSIS — K5000 Crohn's disease of small intestine without complications: Secondary | ICD-10-CM | POA: Diagnosis not present

## 2019-08-21 DIAGNOSIS — Z79899 Other long term (current) drug therapy: Secondary | ICD-10-CM

## 2019-08-21 DIAGNOSIS — D500 Iron deficiency anemia secondary to blood loss (chronic): Secondary | ICD-10-CM

## 2019-08-21 LAB — CBC
HCT: 36.8 % (ref 36.0–46.0)
Hemoglobin: 12.2 g/dL (ref 12.0–15.0)
MCHC: 33 g/dL (ref 30.0–36.0)
MCV: 87.6 fl (ref 78.0–100.0)
Platelets: 414 10*3/uL — ABNORMAL HIGH (ref 150.0–400.0)
RBC: 4.2 Mil/uL (ref 3.87–5.11)
RDW: 15 % (ref 11.5–15.5)
WBC: 9.4 10*3/uL (ref 4.0–10.5)

## 2019-08-21 LAB — FERRITIN: Ferritin: 66.9 ng/mL (ref 10.0–291.0)

## 2019-08-21 NOTE — Assessment & Plan Note (Signed)
Presumably related to multiple sclerosis.  I encouraged her to solicit help and to consider OT/PT help.  She has had that in the past for other things and was not enthusiastic about this though I think today was a bad day with the recent fall and she was emotional.  She promises to discuss with Dr. Tomi Likens.

## 2019-08-21 NOTE — Progress Notes (Signed)
Lindsay Mccarthy 43 y.o. December 23, 1975 [id number]  Assessment & Plan:   Crohn's disease of small intestine ([place]) Clinical remission on entyvio  Bile salt-induced diarrhea Colestipol   Iron deficiency anemia due to chronic blood loss CBC ferritin She had 1 dose of Feraheme recently but cannot have another because that medication is not on her formulary anymore Once I see CBC and ferritin will determine next step as to what she might have she has other options  Long-term use of immunosuppressant medication Entyvio + ocrelizumab Flu vaccine done  Hematoma of right flank This is small.  I do not think it represents retroperitoneal hemorrhage but if hemoglobin is significantly low we need to consider that.  Pulmonary nodule She is to have a follow-up CT of this.  She was asking if she needs an abdominal pelvic CT and unless something changes or I need to look for retroperitoneal hematoma as above I do not think she needs abdominal imaging at this time from my perspective.  Multiple falls Presumably related to multiple sclerosis.  I encouraged her to solicit help and to consider OT/PT help.  She has had that in the past for other things and was not enthusiastic about this though I think today was a bad day with the recent fall and she was emotional.  She promises to discuss with Dr. Tomi Likens.  I appreciate the opportunity to care for this patient. CC: Rita Ohara, MD Dr. Metta Clines   Subjective:   Chief Complaint: Follow-up of Crohn's disease on Entyvio  HPI Lindsay Mccarthy is here today, reporting no problems with diarrhea or significant abdominal pain or issues with her Crohn's disease on Entyvio.  She is however, having some falls.  Couple of times a month at least over the past few months.  She has a cane.  It is  particularly rough today and she is tearful.  She says her legs do not work right or cannot tell that she is walking and she will stumble and fall at times.  Yesterday she fell  and struck her right abdomen and flank.  It is sore in this area.  She has an appointment with Dr. Tomi Likens of neurology coming up in early November.  She has not had physical therapy or occupational therapy recently to help with these issues nor has she requested it or told other doctors. She remains on Eliquis.  Using colestipol about 2 or 3 times a week helping with bile salt diarrhea  Wt Readings from Last 3 Encounters:  08/21/19 238 lb (108 kg)  07/18/19 234 lb (106.1 kg)  07/12/19 233 lb (105.7 kg)  Was 99 kg in March 2020  Allergies  Allergen Reactions  . Ibuprofen Other (See Comments)    Avoids due to Crohns disease  . Morphine And Related Itching    itchy  . Prednisone Other (See Comments)    "crazy", hallucinations Tolerates methylprednisolone  . Adhesive [Tape] Rash    Rash with electrodes   Current Meds  Medication Sig  . acetaminophen (TYLENOL) 500 MG tablet Take 1,000 mg by mouth every 6 (six) hours as needed for headache (pain). Reported on 12/15/2015  . apixaban (ELIQUIS) 5 MG TABS tablet TAKE 1 TABLET(5 MG) BY MOUTH TWICE DAILY  . Biotin 10000 MCG TABS Take by mouth.  Marland Kitchen buPROPion (WELLBUTRIN SR) 100 MG 12 hr tablet Take 1 tablet (100 mg total) by mouth 2 (two) times daily.  . cetirizine (ZYRTEC) 10 MG tablet Take 10 mg by mouth at  bedtime.   . Cholecalciferol (VITAMIN D3) 2000 units TABS Take 3,000 Units by mouth every evening.   . colestipol (COLESTID) 5 g packet TAKE 5 GRAMS BY MOUTH DAILY  . cyanocobalamin (,VITAMIN B-12,) 1000 MCG/ML injection Inject 1000 mcg weekly x 4 then monthly  . dalfampridine (AMPYRA) 10 MG TB12 Take 1 tablet (10 mg total) by mouth 2 (two) times daily.  . diphenhydrAMINE (BENADRYL) 25 MG tablet Take 25 mg by mouth at bedtime as needed for sleep.  Marland Kitchen escitalopram (LEXAPRO) 10 MG tablet TAKE 1 TABLET(10 MG) BY MOUTH AT BEDTIME  . fenofibrate 160 MG tablet TAKE 1 TABLET(160 MG) BY MOUTH AT BEDTIME  . gabapentin (NEURONTIN) 100 MG capsule TAKE 3  CAPSULES(300 MG) BY MOUTH AT BEDTIME  . levonorgestrel (MIRENA) 20 MCG/24HR IUD 1 each by Intrauterine route once. Implanted April 2017  . ocrelizumab 600 mg in sodium chloride 0.9 % 500 mL Inject 600 mg into the vein every 6 (six) months.  Marland Kitchen OVER THE COUNTER MEDICATION Apply 1 application topically daily as needed (arthritis pain). CBD Oil  . vedolizumab (ENTYVIO) 300 MG injection Inject 300 mg into the vein every 8 (eight) weeks.   Past Medical History:  Diagnosis Date  . Anemia    related to Crohns flares  . Anxiety   . Arthritis    knees, feet, hands, wrists, related to Crohns flare  . Asthma    Childhood  . B12 deficiency    monitored/treated by Dr. Carlean Purl  . Cervical dysplasia   . Crohn's disease of small intestine ([place]) 06/24/2012   Diagnosed 1999, in [place]. Ileitis only then. Treated with Imuran Remicade and prednisone.  Noncompliant with therapy. 2004 return to care and was treated with Remicade prednisone Pentasa Cipro and Flagyl. 2006 status post right hemicolectomy. Subsequently treated with azathioprine and Cimzia. 200 mg every other week.  azathioprine was added in 2010. Prometheus TP MT enzyme was negative. Has ha  . Depression   . DVT of upper extremity (deep vein thrombosis) ([place]) 11/28/2017  . Eczema    arms and behind knees, worse in winter  . History of recurrent UTI (urinary tract infection)   . HPV (human papilloma virus) infection   . Hyperlipemia   . MS (multiple sclerosis) ([place])   . Neuromuscular disorder ([place])   . Osteopenia of femur neck T. score -1.4 09/29/2011   09/2013 T-1.6  . Papanicolaou smear 12/12   last abnormal 2011  . Pulmonary embolism ([place]) 11/30/2017  . Scaphoid fracture of wrist 2013   left  . Seasonal allergic rhinitis   . Shingles 09/2015   R hip  . Wears glasses    Past Surgical History:  Procedure Laterality Date  . APPENDECTOMY    . CERVICAL BIOPSY  W/ LOOP ELECTRODE EXCISION  2009   ---paps normal since  . COLONOSCOPY   multiple   scanned  . ESOPHAGOGASTRODUODENOSCOPY  multiple   scanned  . HEMICOLECTOMY  2006  . IR ANGIOGRAM PULMONARY BILATERAL SELECTIVE  12/03/2017  . IR ANGIOGRAM SELECTIVE EACH ADDITIONAL VESSEL  12/03/2017  . IR ANGIOGRAM SELECTIVE EACH ADDITIONAL VESSEL  12/03/2017  . IR INFUSION THROMBOL ARTERIAL INITIAL (MS)  12/03/2017  . IR INFUSION THROMBOL ARTERIAL INITIAL (MS)  12/03/2017  . IR IVC FILTER PLMT / S&I /IMG GUID/MOD SED  12/04/2017  . IR THROMB F/U EVAL ART/VEN FINAL DAY (MS)  12/04/2017  . IR US GUIDE VASC ACCESS RIGHT  12/03/2017  . IVC FILTER REMOVAL N/A 07/11/2018   Procedure:  IVC FILTER REMOVAL;  Surgeon: Serafina Mitchell, MD;  Location: [facility];  Service: Cardiovascular;  Laterality: N/A;  . IVC VENOGRAPHY N/A 07/11/2018   Procedure: IVC Venography;  Surgeon: Serafina Mitchell, MD;  Location: [facility];  Service: Cardiovascular;  Laterality: N/A;  . LEEP  2011   --done in [place]  . PERIPHERAL VASCULAR BALLOON ANGIOPLASTY Right 11/30/2017   Procedure: PERIPHERAL VASCULAR BALLOON ANGIOPLASTY;  Surgeon: Serafina Mitchell, MD;  Location: [facility];  Service: Cardiovascular;  Laterality: Right;  . PERIPHERAL VASCULAR THROMBECTOMY N/A 11/29/2017   Procedure: PERIPHERAL VASCULAR THROMBECTOMY - THROMBOLYSIS;  Surgeon: Serafina Mitchell, MD;  Location: [facility];  Service: Cardiovascular;  Laterality: N/A;  LYSIS CATHETER PLACEMENT ONLY  . PERIPHERAL VASCULAR THROMBECTOMY N/A 11/30/2017   Procedure: PERIPHERAL VASCULAR THROMBECTOMY - Lysis Recheck;  Surgeon: Serafina Mitchell, MD;  Location: [facility];  Service: Cardiovascular;  Laterality: N/A;  . WISDOM TOOTH EXTRACTION     Social History   Social History Narrative   The patient is divorced.     Re-married Marcello Moores, partner of 10 years, on 10/10/16. 2 dogs, 1 cat   No children - doesn't want any   Curator for [employer].   Moved from [place] to [place] in 2013.   Past  smoker   No alcohol   2-3 caffeinated beverages a day   She reports she is compliant with sunscreen given her increased risk of sun damage and skin cancer (no longer on azathioprine)      Updated 12/20/2018   family history includes Alcohol abuse in her sister; Bone cancer in her mother; Breast cancer (age of onset: 73) in her mother; Colon cancer in her paternal grandfather; Colon polyps in her father; Depression in her mother; Diabetes in her father; Fuch's dystrophy in her sister; Heart disease in her paternal grandmother; Hyperlipidemia in her father; Hypertension in her father, maternal grandmother, mother, paternal grandmother, and sister; Hypothyroidism in her mother; Lung cancer in her maternal grandmother; Multiple sclerosis in her sister; Stroke in her maternal grandmother.   Review of Systems As above  Objective:   Physical Exam BP 118/88   Pulse 88   Temp 98.1 F (36.7 C)   Ht 5' 4"  (1.626 m)   Wt 238 lb (108 kg)   SpO2 97%   BMI 40.85 kg/m  Tearful4 Anicteric Lungs cta Cor NL abd obese sl RLQ  - small right flank hematoma

## 2019-08-21 NOTE — Assessment & Plan Note (Addendum)
CBC ferritin She had 1 dose of Feraheme recently but cannot have another because that medication is not on her formulary anymore Once I see CBC and ferritin will determine next step as to what she might have she has other options

## 2019-08-21 NOTE — Assessment & Plan Note (Signed)
Clinical remission on entyvio

## 2019-08-21 NOTE — Patient Instructions (Addendum)
If you are age 41 or older, your body mass index should be between 23-30. Your Body mass index is 40.85 kg/m. If this is out of the aforementioned range listed, please consider follow up with your Primary Care Provider.  If you are age 79 or younger, your body mass index should be between 19-25. Your Body mass index is 40.85 kg/m. If this is out of the aformentioned range listed, please consider follow up with your Primary Care Provider.   To help prevent the possible spread of infection to our patients, communities, and staff; we will be implementing the following measures:  As of now we are not allowing any visitors/family members to accompany you to any upcoming appointments with [facility]. If you have any concerns about this please contact our office to discuss prior to the appointment.   Your provider has requested that you go to the basement level for lab work before leaving today. Press "B" on the elevator. The lab is located at the first door on the left as you exit the elevator.  I appreciate the opportunity to care for you. Silvano Rusk, MD, [facility]

## 2019-08-21 NOTE — Assessment & Plan Note (Signed)
She is to have a follow-up CT of this.  She was asking if she needs an abdominal pelvic CT and unless something changes or I need to look for retroperitoneal hematoma as above I do not think she needs abdominal imaging at this time from my perspective.

## 2019-08-21 NOTE — Assessment & Plan Note (Signed)
Flu vaccine done

## 2019-08-21 NOTE — Assessment & Plan Note (Signed)
This is small.  I do not think it represents retroperitoneal hemorrhage but if hemoglobin is significantly low we need to consider that.

## 2019-08-21 NOTE — Assessment & Plan Note (Signed)
Colestipol

## 2019-08-22 ENCOUNTER — Encounter: Payer: Self-pay | Admitting: Family Medicine

## 2019-08-23 ENCOUNTER — Other Ambulatory Visit: Payer: Self-pay | Admitting: *Deleted

## 2019-08-23 DIAGNOSIS — R918 Other nonspecific abnormal finding of lung field: Secondary | ICD-10-CM

## 2019-08-23 NOTE — Progress Notes (Signed)
Hgb NL Sheri,   Please tell Trish ferritin improved and technically normal but goal > 100  Please schedule for Venofer infusion per pharmacy protocol in November.  This is 1x infusion - recheck CBC, ferritin 1 mo after that

## 2019-08-24 ENCOUNTER — Other Ambulatory Visit: Payer: Self-pay

## 2019-08-24 ENCOUNTER — Encounter: Payer: 59 | Attending: Family Medicine | Admitting: Dietician

## 2019-08-24 DIAGNOSIS — Z713 Dietary counseling and surveillance: Secondary | ICD-10-CM | POA: Diagnosis present

## 2019-08-24 DIAGNOSIS — E781 Pure hyperglyceridemia: Secondary | ICD-10-CM

## 2019-08-24 DIAGNOSIS — K5000 Crohn's disease of small intestine without complications: Secondary | ICD-10-CM

## 2019-08-24 NOTE — Patient Instructions (Signed)
Continue the great changes that you have made! Continue to stay active  Mindfulness: 3 Meals per day.  Include plenty of nutrition rich foods as you tolerate. Eat slowly and stop when you are satisfied. Between meals: If you are hungry have a small snack.  If you are not hungry, what can you do instead?- paint, puzzle, stretch, TV. Avoid eating in front of the computer of TV.    Continue Bright Line eating.

## 2019-08-24 NOTE — Progress Notes (Signed)
Medical Nutrition Therapy:  Appt start time: 8315 end time:  1400.   Assessment:  Primary concerns today: Patient is here today for a nutrition follow up.  Last visit was 07/12/2019.  Since last visit: States that the smoothie did not agree with her- increased diarrhea States that her energy is better and rarely requires a nap  Working on Steps for Success- has gotten 5,000 steps per day. Treadmill 5 minutes per day. Drinking more water. Works with the [employer]. Weight 233 lbs stable. She has made changes as she feels the negative impact of her weight on her joints and does not want to gain further weight.  History includes Crohn's disease of the small intestine, osteopenia, MS, vitamin B-12 deficiency, Vitamin D deficiency, and depression.  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  She does have dumping syndrome if she is not careful with her diet. 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  Labs include:  12/15/2018:  Cholesterol 116, HDL 57, LDL 62, Triglycerides:  203, vitamin B-12 366, vitamin D 21 (09/12/18)  Weight hx: 233 lbs 08/24/2019 She states that she fought obesity in the past since she was 61.  She tried Weight Watcher's in the past and loses the weight and then something happens (depressions, death of family, new diagnosis, steroids due to crohn's) and regains the weight. 233 lbs 07/12/2019 which is her highest adult weight 158 lbs lowest adult weight at age 37 States her husband is supportive of any weight that she is.  States that her husband wants to lose weight. She would like to lose weight but improving her health is her first priority. She has just made an appointment to see a counselor and states that she saw this person for about 6 months last year and lost 15 pounds as a result.  Patient lives with her husband and 2 dogs and a cat.  She does the shopping and cooking.  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  Moved from  [place] to [place] in 2013. She enjoys paint-by-number, riding her motorcycle, cooking, and TV. She does meal planning weekly.  Preferred Learning Style:   No preference indicated   Learning Readiness:   Ready  Change in progress   MEDICATIONS: see list to include vitamin d (4000 units daily), vitamin B-12 once per month.  She had tried fish oil in the past but increased reflux and increased oily stools. She does not think that she has gained weight from her medication.   DIETARY INTAKE: Her caloric intake is approximately 1400 calories per day. Using NOOM app program.  Using paint by number instead of boredom eating. Avoided foods include bread. Alcohol, trail mix and popcorn are "red line foods". 24-hr recall:  B ( AM): 3/4 cup old fashioned rolled oats cooked with unsweetened almond milk, banana or strawberries, truvia, cinnamon  Snk ( AM): none  L ( PM): vegetable, lean protein (air fried chicken strip) Snk ( PM): slim fast shake, canned fruit or baked tortillas, cottage cheese, salsa D ( PM): 400-500 calories protein, vegetable (less processed foods), potato (pasta or bread 2 times per week) Snk ( PM): none for the past 3 weeks Beverages: water, 1 diet soda per day, 2 cups black coffee per day  Usual physical activity: limited due to MS, falls a lot, occasionally does a treadmill, has a pool where she lives but has to be careful about sun with her medication and does not  like to wear bathing suits.  She also has a heat sensitivity.  [facility] [employer] [place].  Progress Towards Goal(s):  In progress.   Nutritional Diagnosis:  [place]-3.3 Overweight/obesity As related to excessive caloric intake, inability to be physically active and other metabolic causes.  As evidenced by BMI.    Intervention:  Nutrition counseling/education continued.    PLAN: Continue the great changes that you have made! Continue to stay active  Mindfulness: 3 Meals per day.  Include plenty  of nutrition rich foods as you tolerate. Eat slowly and stop when you are satisfied. Between meals: If you are hungry have a small snack.  If you are not hungry, what can you do instead?- paint, puzzle, stretch, TV. Avoid eating in front of the computer of TV.   Continue Bright Line eating.  Teaching Method Utilized:  Auditory  Barriers to learning/adherence to lifestyle change: MS  Demonstrated degree of understanding via:  Teach Back   Monitoring/Evaluation:  Dietary intake, exercise, and body weight prn.

## 2019-08-29 ENCOUNTER — Other Ambulatory Visit: Payer: 59

## 2019-08-29 ENCOUNTER — Ambulatory Visit
Admission: RE | Admit: 2019-08-29 | Discharge: 2019-08-29 | Disposition: A | Discharge status: Home / Self Care | Payer: 59 | Source: Ambulatory Visit | Attending: Family Medicine | Admitting: Family Medicine

## 2019-08-29 DIAGNOSIS — R918 Other nonspecific abnormal finding of lung field: Secondary | ICD-10-CM

## 2019-09-04 ENCOUNTER — Other Ambulatory Visit: Payer: Self-pay

## 2019-09-04 ENCOUNTER — Ambulatory Visit (INDEPENDENT_AMBULATORY_CARE_PROVIDER_SITE_OTHER): Payer: 59 | Admitting: Psychology

## 2019-09-04 ENCOUNTER — Ambulatory Visit: Payer: 59 | Admitting: Psychology

## 2019-09-04 ENCOUNTER — Encounter: Payer: Self-pay | Admitting: Psychology

## 2019-09-04 DIAGNOSIS — F330 Major depressive disorder, recurrent, mild: Secondary | ICD-10-CM | POA: Diagnosis not present

## 2019-09-04 DIAGNOSIS — G35 Multiple sclerosis: Secondary | ICD-10-CM

## 2019-09-04 DIAGNOSIS — Z0279 Encounter for issue of other medical certificate: Secondary | ICD-10-CM

## 2019-09-04 NOTE — Progress Notes (Signed)
   Neuropsychology Note   LEVONNE CARRERAS completed 140 minutes of neuropsychological testing with technician, Milana Kidney, B.S., under the supervision of Dr. Christia Reading, Ph.D., licensed neuropsychologist. The patient did not appear overtly distressed by the testing session, per behavioral observation or via self-report to the technician. Rest breaks were offered.    In considering the patient's current level of functioning, level of presumed impairment, nature of symptoms, emotional and behavioral responses during the interview, level of literacy, and observed level of motivation/effort, a battery of tests was selected and communicated to the psychometrician.   Communication between the psychologist and technician was ongoing throughout the testing session and changes were made as deemed necessary based on patient performance on testing, technician observations and additional pertinent factors such as those listed above.   JANANN BOEVE will return within approximately two weeks for an interactive feedback session with Dr. Melvyn Novas at which time his test performances, clinical impressions, and treatment recommendations will be reviewed in detail. The patient understands she can contact our office should she require our assistance before this time.   Full report to follow.  140 minutes were spent face-to-face with patient administering standardized tests and 15 minutes were spent scoring (technician). [CPT [id number], [id number]]

## 2019-09-04 NOTE — Progress Notes (Signed)
Pt drop off FMLA paper work for completion. $25 fee paid  [place] and [employer].  Case # [id number] IFN [phone]

## 2019-09-04 NOTE — Progress Notes (Signed)
NEUROPSYCHOLOGICAL EVALUATION . [facility] Department of Neurology  Reason for Referral:   Lindsay Mccarthy is a 43 y.o. Caucasian female referred by Metta Clines, D.O., to characterize her current cognitive functioning and assist with diagnostic clarity and treatment planning in the context of multiple sclerosis (MS) and subjective cognitive decline.  Assessment and Plan:   Clinical Impression(s): Ms. Lindsay Mccarthy pattern of performance is suggestive of neuropsychological functioning largely within normal limits. She did exhibit a weakness across aspects of basic attention/concentration, as well as some performance variability across processing speed. However, performance across domains of complex attention, executive functioning, receptive and expressive language, visuospatial abilities and verbal and visual learning and memory were within appropriate normative ranges given premorbid intellectual estimations.  The etiology for aforementioned areas of cognitive weakness could certainly be her history of MS. Cognitive deficits in MS are found in roughly 45-65% of individuals with this condition and are largely dependant on overall lesion load and lesion location within the brain. Deficits in processing speed, attention/concentration, executive functioning, and encoding/retrieval aspects of memory are typical of MS. Additionally, across mood-related questionnaires, she reported mild symptoms of anxiety and moderate symptoms of depression, both of which commonly impact processing speed and attention/concentration. It is also possible that these factors are largely responsible for day-to-day cognitive difficulties which Ms. Lindsay Mccarthy has been experiencing presently.  Recommendations: The current evaluation will serve as an excellent baseline should Ms. Lindsay Mccarthy require or request future cognitive testing, given the presence of her neurodegenerative illness. A repeat  neuropsychological evaluation is recommended to assess the trajectory of future cognitive decline should it occur. There is no timeline for when this should occur and should be based upon perceived cognitive and/or functional decline.  To treat ongoing mood symptoms, Ms. Lindsay Mccarthy is encouraged to continue working with her individual therapist. She would benefit from an active and collaborative therapeutic environment, rather than one purely supportive in nature. Recommended treatment modalities include Cognitive Behavioral Therapy (CBT) or Acceptance and Commitment Therapy (ACT). She is also encouraged to speak with her prescribing physician regarding medication adjustments to assist with optimizing her mood symptoms.   To address problems with fluctuating attention, she may wish to consider:   -Avoiding external distractions when needing to concentrate   -Limiting exposure to fast paced environments with multiple sensory demands   -Writing down complicated information and using checklists   -Attempting and completing one task at a time (i.e., no multi-tasking)   -Verbalizing aloud each step of a task to maintain focus   -Reducing the amount of information considered at one time  Review of Records:   Ms. Lindsay Mccarthy was seen by [facility] Metta Clines, D.O.) on 03/07/2019 for follow-up of MS. Briefly, Ms. Lindsay Mccarthy reported various neurological symptoms present since her late 57s, including urinary incontinence, poor night vision, numbness and tingling in her hands, abnormal gait (e.g., trouble lifting her feet and feeling like they are stuck in cement), and temperature sensitivity. Cervical MRI on 07/22/2018 revealed multifocal spinal cord signal abnormalities compatible with chronic demyelinating lesions in the cervical and thoracic cord. Brain MRI on 07/25/2018 revealed mild periventricular and juxtacortical white matter disease, presumed to be demyelination given cervical MR findings. She was ultimately  diagnosed with MS in the fall of 2019. Subsequently, she was referred for a comprehensive neuropsychological evaluation to characterize her cognitive abilities and to assist with diagnostic clarity and treatment planning.   Past Medical History:  Diagnosis Date   Anemia    related  to Crohns flares   Arm DVT (deep venous thromboembolism), acute, right ([place]) 11/29/2017   Arthritis    knees, feet, hands, wrists, related to Crohns flare   Asthma    Childhood   B12 deficiency    monitored/treated by Dr. Carlean Purl   Cervical dysplasia    Crohn's disease of small intestine ([place]) 06/24/2012   Diagnosed 1999, in [place]. Ileitis only then. Treated with Imuran Remicade and prednisone.  Noncompliant with therapy. 2004 return to care and was treated with Remicade prednisone Pentasa Cipro and Flagyl. 2006 status post right hemicolectomy. Subsequently treated with azathioprine and Cimzia. 200 mg every other week.  azathioprine was added in 2010. Prometheus TP MT enzyme was negative.   Depression    DVT of upper extremity (deep vein thrombosis) ([place]) 11/28/2017   Eczema    arms and behind knees, worse in winter   Generalized anxiety disorder    History of recurrent UTI (urinary tract infection)    HPV (human papilloma virus) infection    Hyperlipemia    Hypertriglyceridemia 07/03/2012   10/2010 labs showed level of 753 with total cholesterol 180 and HDL 41' Labs 05/2013--TG 180    Major depressive disorder in remission ([place]) 10/03/2013   Multiple sclerosis ([place]) 2019   Officially diagnosed in the Fall   Neuromuscular disorder ([place])    Osteopenia 09/29/2011   T-1.6   Papanicolaou smear 12/12   last abnormal 2011   Pulmonary embolism ([place]) 11/30/2017   Scaphoid fracture of wrist 2013   left   Seasonal allergic rhinitis    Shingles 09/2015   R hip   Vitamin B12 deficiency 06/19/2012   Vitamin D deficiency 12/16/2015   Vitamin D-OH level 12     Past Surgical History:    Procedure Laterality Date   APPENDECTOMY     CERVICAL BIOPSY  W/ LOOP ELECTRODE EXCISION  2009   ---paps normal since   COLONOSCOPY  multiple   scanned   ESOPHAGOGASTRODUODENOSCOPY  multiple   scanned   HEMICOLECTOMY  2006   IR ANGIOGRAM PULMONARY BILATERAL SELECTIVE  12/03/2017   IR ANGIOGRAM SELECTIVE EACH ADDITIONAL VESSEL  12/03/2017   IR ANGIOGRAM SELECTIVE EACH ADDITIONAL VESSEL  12/03/2017   IR INFUSION THROMBOL ARTERIAL INITIAL (MS)  12/03/2017   IR INFUSION THROMBOL ARTERIAL INITIAL (MS)  12/03/2017   IR IVC FILTER PLMT / S&I /IMG GUID/MOD SED  12/04/2017   IR THROMB F/U EVAL ART/VEN FINAL DAY (MS)  12/04/2017   IR US GUIDE VASC ACCESS RIGHT  12/03/2017   IVC FILTER REMOVAL N/A 07/11/2018   Procedure: IVC FILTER REMOVAL;  Surgeon: Serafina Mitchell, MD;  Location: [place] [facility];  Service: Cardiovascular;  Laterality: N/A;   IVC VENOGRAPHY N/A 07/11/2018   Procedure: IVC Venography;  Surgeon: Serafina Mitchell, MD;  Location: [place] [facility];  Service: Cardiovascular;  Laterality: N/A;   LEEP  2011   --done in [place] Right 11/30/2017   Procedure: PERIPHERAL VASCULAR BALLOON ANGIOPLASTY;  Surgeon: Serafina Mitchell, MD;  Location: [place] [facility];  Service: Cardiovascular;  Laterality: Right;   PERIPHERAL VASCULAR THROMBECTOMY N/A 11/29/2017   Procedure: PERIPHERAL VASCULAR THROMBECTOMY - THROMBOLYSIS;  Surgeon: Serafina Mitchell, MD;  Location: [place] [facility];  Service: Cardiovascular;  Laterality: N/A;  LYSIS CATHETER PLACEMENT ONLY   PERIPHERAL VASCULAR THROMBECTOMY N/A 11/30/2017   Procedure: PERIPHERAL VASCULAR THROMBECTOMY - Lysis Recheck;  Surgeon: Serafina Mitchell, MD;  Location: [place] [facility]  [facility];  Service: Cardiovascular;  Laterality: N/A;   WISDOM TOOTH EXTRACTION      Family History  Problem Relation Age of Onset   Hypertension Mother    Breast cancer Mother 33   Bone cancer Mother        metastatic  from breast   Depression Mother    Hypothyroidism Mother    Colon polyps Father    Diabetes Father        borderline--resolved 2020   Hypertension Father    Hyperlipidemia Father    Colon cancer Paternal Grandfather    Multiple sclerosis Sister    Alcohol abuse Sister    Fuch's dystrophy Sister    Stroke Maternal Grandmother    Lung cancer Maternal Grandmother    Hypertension Maternal Grandmother    Heart disease Paternal Grandmother    Hypertension Paternal Grandmother    Hypertension Sister      Current Outpatient Medications:    acetaminophen (TYLENOL) 500 MG tablet, Take 1,000 mg by mouth every 6 (six) hours as needed for headache (pain). Reported on 12/15/2015, Disp: , Rfl:    apixaban (ELIQUIS) 5 MG TABS tablet, TAKE 1 TABLET(5 MG) BY MOUTH TWICE DAILY, Disp: 60 tablet, Rfl: 5   Biotin 10000 MCG TABS, Take by mouth., Disp: , Rfl:    buPROPion (WELLBUTRIN SR) 100 MG 12 hr tablet, Take 1 tablet (100 mg total) by mouth 2 (two) times daily., Disp: 60 tablet, Rfl: 5   cetirizine (ZYRTEC) 10 MG tablet, Take 10 mg by mouth at bedtime. , Disp: , Rfl:    Cholecalciferol (VITAMIN D3) 2000 units TABS, Take 3,000 Units by mouth every evening. , Disp: , Rfl:    colestipol (COLESTID) 5 g packet, TAKE 5 GRAMS BY MOUTH DAILY, Disp: 30 each, Rfl: 5   cyanocobalamin (,VITAMIN B-12,) 1000 MCG/ML injection, Inject 1000 mcg weekly x 4 then monthly, Disp: 10 mL, Rfl: 1   dalfampridine (AMPYRA) 10 MG TB12, Take 1 tablet (10 mg total) by mouth 2 (two) times daily., Disp: 60 tablet, Rfl: 6   diphenhydrAMINE (BENADRYL) 25 MG tablet, Take 25 mg by mouth at bedtime as needed for sleep., Disp: , Rfl:    escitalopram (LEXAPRO) 10 MG tablet, TAKE 1 TABLET(10 MG) BY MOUTH AT BEDTIME, Disp: 30 tablet, Rfl: 5   fenofibrate 160 MG tablet, TAKE 1 TABLET(160 MG) BY MOUTH AT BEDTIME, Disp: 90 tablet, Rfl: 0   gabapentin (NEURONTIN) 100 MG capsule, TAKE 3 CAPSULES(300 MG) BY MOUTH AT  BEDTIME, Disp: 180 capsule, Rfl: 3   levonorgestrel (MIRENA) 20 MCG/24HR IUD, 1 each by Intrauterine route once. Implanted April 2017, Disp: , Rfl:    ocrelizumab 600 mg in sodium chloride 0.9 % 500 mL, Inject 600 mg into the vein every 6 (six) months., Disp: , Rfl:    OVER THE COUNTER MEDICATION, Apply 1 application topically daily as needed (arthritis pain). CBD Oil, Disp: , Rfl:    vedolizumab (ENTYVIO) 300 MG injection, Inject 300 mg into the vein every 8 (eight) weeks., Disp: 1 vial, Rfl: 6  Clinical Interview:   Cognitive Symptoms: Decreased short-term memory: Denied. Decreased long-term memory: Denied. Decreased attention/concentration: Endorsed. Difficulties surrounded trouble maintaining her focus, increased ease of distractibility, and frequently losing her train of thought. These were said to have exhibited a gradual decline over the past 3-5 years.  Reduced processing speed: Endorsed. Difficulties with executive functions: Endorsed. Difficulties with organization and planning were said to be longstanding in nature. Ms. Lindsay Mccarthy also reported trouble with  multi-tasking, which has worsened over the years.  Difficulties with emotion regulation: Denied. Difficulties with receptive language: Endorsed. These difficulties were generally attributed to deficits in attention/concentration and processing speed. Difficulties with word finding: Endorsed. She noted frequently experiencing tip-of-the-tongue phenomenons where she cannot remember the word she intends to use when conversing with others.  Decreased visuoperceptual ability: Endorsed. These were noted to be "very poor" and have gradually worsened over the past 5 years.   Difficulties completing ADLs: Denied.  Additional Medical History: History of traumatic brain injury/concussion: Unclear. Approximately 8 years ago, Ms. Lindsay Mccarthy fell and hit the back of her head. She denied losing consciousness, but did feel dazed and disoriented for  an extended period of time. She did not receive medical attention and denied the presence of any persisting post-concussion symptoms.  History of stroke: Denied. History of seizure activity: Denied. History of known exposure to toxins: Denied. Symptoms of chronic pain: Endorsed. Daily pain symptoms were attributed both to her history of MS and Crohn's disease. Pain severity was said to vary day-by-day, with some days being more manageable and others being quite debilitating.  Experience of frequent headaches/migraines: Denied. Frequent instances of dizziness/vertigo: Endorsed. Dizziness was said to occur when standing quickly from a previously seated or prone position, as well as when turning her head too quickly.   Sensory changes: Ms. Marchese noted a very significant decline in her vision over the past 1 year, stating that she "now has the vision of a 43 year old." She is currently able to use corrective lenses with positive effect. She also noted diminished hearing. Other sensory changes/difficulties (e.g., taste and smell) were denied.  Balance/coordination difficulties: Endorsed. Ongoing balance instability was attributed to MS and cervical MRI findings. She reported a history of 7-8 falls occurring since March 2020.  Other motor difficulties: Denied.  Sleep History: Estimated hours obtained each night: 8-10 hours. Difficulties falling asleep: Denied. Difficulties staying asleep: Largely denied. She did note waking up several times throughout the night to use the restroom. However, she denied difficulties falling back asleep.  Feels rested and refreshed upon awakening: Endorsed.  History of snoring: Denied. History of waking up gasping for air: Denied. Witnessed breath cessation while asleep: Denied.  History of vivid dreaming: Denied. Excessive movement while asleep: Denied. Instances of acting out her dreams: Denied.  Psychiatric/Behavioral Health History: Depression: Endorsed. Ms.  Lindsay Mccarthy reported longstanding diagnoses of both major depressive disorder and generalized anxiety disorder. Both conditions were said to currently be treated well via oral medications. Currently, she described her mood as "all right for the most part." Current or remote suicidal ideation, intent, or plan was denied.  Anxiety: Endorsed. Mania: Denied. Trauma History: Denied.  Visual/auditory hallucinations: Endorsed. Approximately 10 years ago, Ms. Lindsay Mccarthy reported hallucinating a large wolf. She communicated this experience to her medical team and stated that her sleep specialist told her that she was "either crazy or fat." It was unclear what he/she meant by this comment or if he/she was alluding to possible hypnagogic or hypnopompic hallucinations. Ms. Lindsay Mccarthy noted that she went on anti-depressant medications after this experience and has not had any other visual hallucinations since.  Delusional thoughts: Denied. Mental health treatment: Endorsed. She reported ongoing care by an individual counselor, with whom she described a positive relationship with.  Tobacco: Denied. Alcohol: Ms. Lindsay Mccarthy denied current alcohol consumption, as well as a history of problematic alcohol use, abuse, or dependence.  Recreational drugs: Denied. Caffeine: 2 cups of coffee in the morning.  Academic/Vocational History: Highest level of educational attainment: 13 years. Ms. 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 She described herself as an average (C) student in academic settings. Lavena Lindsay Mccarthy was described as a relative weakness.  History of developmental delay: Denied. History of grade repetition: Denied. History of class failures: Denied. Enrollment in special education courses: Denied. History of diagnosed specific learning disability: Denied. History of ADHD: Denied.  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   Evaluation Results:   Behavioral Observations: Ms. Lindsay Mccarthy was unaccompanied, arrived to her appointment on time, and was appropriately dressed and groomed. She ambulated with the assistance of a cane. Her pace was appropriate and she did not require additional external sources for support while walking to the evaluation rooms. Gross motor functioning appeared intact upon informal observation and no abnormal movements (e.g., tremors) were noted. Her affect was generally relaxed and positive, but did range appropriately given the subject being discussed during the clinical interview or the task at hand during testing procedures. Spontaneous speech was fluent and word finding difficulties were not observed during the clinical interview or testing procedures. Sustained attention was appropriate throughout. Thought processes were coherent, organized, and normal in content. Task engagement was adequate and she persisted well when challenged. Overall, Ms. Lindsay Mccarthy was cooperative with the clinical interview and subsequent testing procedures.   Adequacy of Effort: The validity of neuropsychological testing is limited by the extent to which the individual being tested may be assumed to have exerted adequate effort during testing. Ms. Lindsay Mccarthy expressed her intention to perform to the best of her abilities and exhibited adequate task engagement and persistence. Scores across stand-alone and embedded performance validity measures were within expectation. As such, the results of the current evaluation are believed to be a valid representation of Ms. Lindsay Mccarthy current cognitive functioning.  Test Results: Ms. Lindsay Mccarthy was fully oriented at the time of the current evaluation.  Intellectual abilities based upon educational and vocational attainment were estimated to be in the average range. Premorbid abilities were estimated to be within the average range based upon a single-word reading test.     Processing speed was within normal limits. Basic attention was exceptionally low. More complex attention (e.g., working memory) was average. Performance across tasks assessing executive functions (e.g., cognitive flexibility, response inhibition, nonverbal abstract reasoning, analytical problem solving) were within normal limits.  Assessed receptive language abilities were within normal limits. Likewise, Ms. Lindsay Mccarthy did not exhibit any difficulties comprehending task instructions and answered all questions asked of her appropriately. Assessed expressive language (e.g., verbal fluency and confrontation naming) was within normal limits.     Assessed visuospatial/visuoconstructional abilities were within normal limits.    Learning (i.e., encoding) of novel verbal and visual information was within normal limits. Spontaneous delayed recall (i.e., retrieval) of previously learned information was commensurate with performance across initial learning trials. Retention rates were appropriate across memory measures. Performance across recognition tasks was also appropriate, suggesting evidence for information consolidation.   Results of emotional screening instruments suggested that recent symptoms of generalized anxiety were in the mild range, while symptoms of depression were within the moderate range. A screening instrument assessing recent sleep quality suggested the presence of minimal sleep dysfunction.  Tables of Scores:   Note: This summary of test scores accompanies the interpretive report and should not be considered in isolation without reference to the appropriate sections in the text. Descriptors  are based on appropriate normative data and may be adjusted based on clinical judgment. The terms impaired and within normal limits (WNL) are used when a more specific level of functioning cannot be determined.       Effort Testing:   DESCRIPTOR       ACS Word Choice: --- --- Within Expectation   CVLT-III Forced Choice Recognition: --- --- Within Expectation  BVMT-R Retention Percentage: --- --- Within Expectation       Orientation:      Raw Score Percentile   NAB Orientation, Form 1 29/29 --- ---       Intellectual Functioning:           Standard Score Percentile   Test of Premorbid Functioning: 94 34 Average       Memory:          Wechsler Memory Scale (WMS-IV):                       Raw Score (Scaled Score) Percentile     Logical Memory I 30/50 (12) 75 Above Average    Logical Memory II 23/50 (10) 50 Average    Logical Memory Recognition 26/30 51-75 Average       [place] Verbal Learning Test (CVLT-III), Standard Form: Raw Score (Scaled/Standard Score) Percentile     Total Trials 1-5 46/80 (92) 30 Average    List B 4/16 (8) 25 Average    Short-Delay Free Recall 10/16 (9) 37 Average    Short-Delay Cued Recall 13/16 (11) 63 Average    Long Delay Free Recall 14/16 (12) 75 Above Average    Long Delay Cued Recall 15/16 (13) 84 Above Average      Recognition Hits 15/16 (9) 37 Average      False Positive Errors 1 (10) 50 Average       Brief Visuospatial Memory Test (BVMT-R), Form 1: Raw Score (T Score) Percentile     Total Trials 1-3 23/36 (45) 31 Average    Delayed Recall 10/12 (53) 62 Average    Recognition Discrimination Index 5 6-10 Well Below Average      Recognition Hits 5/6 6-10 Well Below Average      False Positive Errors 0 >16 Within Normal Limits        Attention/Executive Function:          Trail Making Test (TMT): Raw Score (T Score) Percentile     Part A 37 secs.,  0 errors (38) 12 Below Average    Part B 64 secs.,  0 errors (44) 27 Average       Symbol Digit Modalities Test (SDMT): Raw Score (Z-Score) Percentile     Written 43 (-1) 16 Below Average    Oral 46 (-1.32) 9 999       NAB Attention Module, Form 1: T Score Percentile     Digits Forward 27 1 Exceptionally Low    Digits Backwards 47 38 Average       D-KEFS Color-Word Interference  Test: Raw Score (Scaled Score) Percentile     Color Naming 32 secs. (9) 37 Average    Word Reading 26 secs. (8) 25 Average    Inhibition 68 secs. (7) 16 Below Average      Total Errors 1 error (10) 50 Average    Inhibition/Switching 73 secs. (8) 25 Average      Total Errors 1 error (11) 63 Average       D-KEFS Verbal Fluency Test: Raw Score (Scaled  Score) Percentile     Letter Total Correct 41 (11) 63 Average    Category Total Correct 35 (8) 25 Average    Category Switching Total Correct 16 (12) 75 Above Average    Category Switching Accuracy 15 (13) 84 Above Average      Total Set Loss Errors 0 (13) 84 Above Average      Total Repetition Errors 8 (3) 1 Exceptionally Low       D-KEFS 20 Questions Test: Scaled Score Percentile     Total Weighted Achievement Score 10 50 Average    Initial Abstraction Score 18 >99 Exceptionally High       D-KEFS Sorting Test: Scaled Score Percentile     Condition 1 - Free Sorting Correct Sorts 11 63 Average    Condition 1 - Free Sorting Description Score 11 63 Average    Condition 2 - Sort Recognition Description Score 10 50 Average    Total Composite Score 11 63 Average       Language:          NAB Language Module, Form 1: T Score Percentile     Auditory Comprehension 56 73 Average    Naming 31/31 (54) 66 Average       Visuospatial/Visuoconstruction:          NAB Spatial Module, Form 1: T Score Percentile     Figure Drawing Copy 52 58 Average    Figure Drawing Immediate Recall 58 79 Above Average        Scaled Score Percentile   WAIS-IV Matrix Reasoning: 11 63 Average  WAIS-IV Visual Puzzles: 12 75 Above Average       Mood and Personality:      Raw Score Percentile   Beck Depression Inventory - II: 20 --- Moderate  PROMIS Anxiety Questionnaire: 19 --- Mild       Additional Questionnaires:      Raw Score Percentile   PROMIS Sleep Disturbance Questionnaire: 17 --- None to Slight   Informed Consent and Coding/Compliance:   Ms.  Lindsay Mccarthy was provided with a verbal description of the nature and purpose of the present neuropsychological evaluation. Also reviewed were the foreseeable risks and/or discomforts and benefits of the procedure, limits of confidentiality, and mandatory reporting requirements of this provider. The patient was given the opportunity to ask questions and receive answers about the evaluation. Oral consent to participate was provided by the patient.   This evaluation was conducted by Christia Reading, Ph.D., licensed clinical neuropsychologist. Ms. Lindsay Mccarthy completed a 30-minute clinical interview, billed as one unit [id number], and 155 minutes of cognitive testing, billed as one unit [phone] and four additional units [id number]. Psychometrist Milana Kidney, B.S., assisted Dr. Melvyn Novas with test administration and scoring procedures. As a separate and discrete service, Dr. Melvyn Novas spent a total of 180 minutes in interpretation and report writing, billed as one unit 96132 and two units 96133.

## 2019-09-06 NOTE — Progress Notes (Signed)
NEUROLOGY FOLLOW UP OFFICE NOTE  Lindsay Mccarthy [id number]  HISTORY OF PRESENT ILLNESS: Lindsay Mccarthy is a 43 year old right-handed woman with Crohn's disease, depression, and anxiety who follows up for multiple sclerosis.  UPDATE:  Current disease modifying therapy:  Ocrevus (first dose on 12/15/18, followed by the second dose on 12/29/18.  Last infusion in early October).   Other medication:  D3 4000 IU daily, B12 injections, Ampyra, Wellbutrin, Lexapro 44m, gabapentin 3029mat bedtime,+ Entyvio  07/18/2019 vitamin D 31.8.  Increased D3 from 2000 IU to 4000 IU shortly before this lab.  Vision:  No issues Motor:  No new issues Sensory:  Numbness and tngling Pain:  Leg pain resolved since starting gabapentin.  But she notes some muscle spasms/twitches involving the body (either eye, stomach or foot). Gait:  Improved since starting Ampyra Bowel/Bladder:  No recent urinary incontinence Fatigue:  Has more energy.  Rarely takes naps now. Cognition:  She underwent neuropsychological testing on 09/04/2019.  Performance fell largely within normal limits.  She did exhibit a weakness across aspects of attention/concentration and some performance variability across processing speed.  Performance involving complex attention, executive functioning, receptive and expressive language, visual-spatial abilities and verbal and visual learning and memory were within normal range. Mood:  Stable  HISTORY: She reports history of various neurologic symptoms since her late 2075s- Urinary incontinence: It started about a year ago and has gotten worse.It occurs suddenly, once or twice a month, usually at home and while standing, such as while washing dishes with the water running. It occurs during fatigued oremotional stress as well. She denies increased urinary frequency or dysuria. No perineal numbness. No low back pain. - She reports poor night vision with difficulty seeing while driving  at night.She has trouble focusing. Recent eye exam showed poor "refocusing", which would normally be seen in an older adult. Her sister had Fuchs dystrophy but she doesn't have it. - She reports numbness and tingling in her hands when she wakes up in the morning or in the evening after work. - She reports abnormal gait. It feels like her feet are stuck in cement. She has trouble lifting her feet. It causes hip pain. She trips over her feet. It is worse when there is an extreme change in temperature. She started having trouble in her mid-30s. Worse over the past 2 years. She reports a stabbing pain in her hips or knees. She reports previously being diagnosed with a pinched nerve in the back that caused a left foot dropin the 20s.She had PT and injections which was ineffective. No numbness in the legs.  - When she throws a ball, her hand doesn't release immediately. There is a delay until she is able to release the ball. She has similar problems with her left hand. No neck pain. - She reports fatigued. She needs to take a nap during the day. Prior sleep study was normal.  One of her twosistershas multiple sclerosis.  Other history: She has Crohn's disease. Earlier this year, she developed a DVT in her right leg. She also was found to have B12 deficiency and is being treated with injections.  01/09/18 LABS: B12 143, Sed rate 18. Hypercoagulable panel unremarkable, including lupus anticoagulant panel, dRVVT mix, beta-2-glycoprotein, cardiolipin antibodies, prothrombin gene mutation, factor V Leiden.  NMO/AQP4+ antibody was negative.Her insurance would not cover anti-MOGantibody testing.  09/2018 LABS:  JC Virus antibody was positive with index of 3.26.  Quantiferon-TB Gold Plus negative.  She tested negative for Hep  B.     07/22/18: MRI Cervical &Thoracic spine w/wo contrast: Multiple T2/STIR hyperintense signal within the spinal cord at C2, C3-C4, C6-C7, T1, T3-T4,  T6-T7, and T12 levels, all non-enhancing. She also demonstrates degenerative spinal stenosis at C4-C5 and C5-C6 with mild spinal cord mass effect. 07/24/18: MRI Brain w/wo contrast: Mild hyperintensein the periventricular and juxtacortical white matter withsignal running perpendicular from the lateral ventricle and involving the corpus callosum, non-enhancing.  PAST MEDICAL HISTORY: Past Medical History:  Diagnosis Date  . Anemia    related to Crohns flares  . Arm DVT (deep venous thromboembolism), acute, right ([place]) 11/29/2017  . Arthritis    knees, feet, hands, wrists, related to Crohns flare  . Asthma    Childhood  . B12 deficiency    monitored/treated by Dr. Carlean Purl  . Cervical dysplasia   . Crohn's disease of small intestine ([place]) 06/24/2012   Diagnosed 1999, in [place]. Ileitis only then. Treated with Imuran Remicade and prednisone.  Noncompliant with therapy. 2004 return to care and was treated with Remicade prednisone Pentasa Cipro and Flagyl. 2006 status post right hemicolectomy. Subsequently treated with azathioprine and Cimzia. 200 mg every other week.  azathioprine was added in 2010. Prometheus TP MT enzyme was negative.  . Depression   . DVT of upper extremity (deep vein thrombosis) ([place]) 11/28/2017  . Eczema    arms and behind knees, worse in winter  . Generalized anxiety disorder   . History of recurrent UTI (urinary tract infection)   . HPV (human papilloma virus) infection   . Hyperlipemia   . Hypertriglyceridemia 07/03/2012   10/2010 labs showed level of 753 with total cholesterol 180 and HDL 41' Labs 05/2013--TG 180   . Major depressive disorder in remission ([place]) 10/03/2013  . Multiple sclerosis ([place]) 2019   Officially diagnosed in the Fall  . Neuromuscular disorder ([place])   . Osteopenia 09/29/2011   T-1.6  . Papanicolaou smear 12/12   last abnormal 2011  . Pulmonary embolism ([place]) 11/30/2017  . Scaphoid fracture of wrist 2013   left  . Seasonal allergic  rhinitis   . Shingles 09/2015   R hip  . Vitamin B12 deficiency 06/19/2012  . Vitamin D deficiency 12/16/2015   Vitamin D-OH level 12     MEDICATIONS: Current Outpatient Medications on File Prior to Visit  Medication Sig Dispense Refill  . acetaminophen (TYLENOL) 500 MG tablet Take 1,000 mg by mouth every 6 (six) hours as needed for headache (pain). Reported on 12/15/2015    . apixaban (ELIQUIS) 5 MG TABS tablet TAKE 1 TABLET(5 MG) BY MOUTH TWICE DAILY 60 tablet 5  . Biotin 10000 MCG TABS Take by mouth.    Marland Kitchen buPROPion (WELLBUTRIN SR) 100 MG 12 hr tablet Take 1 tablet (100 mg total) by mouth 2 (two) times daily. 60 tablet 5  . cetirizine (ZYRTEC) 10 MG tablet Take 10 mg by mouth at bedtime.     . Cholecalciferol (VITAMIN D3) 2000 units TABS Take 3,000 Units by mouth every evening.     . colestipol (COLESTID) 5 g packet TAKE 5 GRAMS BY MOUTH DAILY 30 each 5  . cyanocobalamin (,VITAMIN B-12,) 1000 MCG/ML injection Inject 1000 mcg weekly x 4 then monthly 10 mL 1  . dalfampridine (AMPYRA) 10 MG TB12 Take 1 tablet (10 mg total) by mouth 2 (two) times daily. 60 tablet 6  . diphenhydrAMINE (BENADRYL) 25 MG tablet Take 25 mg by mouth at bedtime as needed for sleep.    Marland Kitchen escitalopram (  LEXAPRO) 10 MG tablet TAKE 1 TABLET(10 MG) BY MOUTH AT BEDTIME 30 tablet 5  . fenofibrate 160 MG tablet TAKE 1 TABLET(160 MG) BY MOUTH AT BEDTIME 90 tablet 0  . gabapentin (NEURONTIN) 100 MG capsule TAKE 3 CAPSULES(300 MG) BY MOUTH AT BEDTIME 180 capsule 3  . levonorgestrel (MIRENA) 20 MCG/24HR IUD 1 each by Intrauterine route once. Implanted April 2017    . ocrelizumab 600 mg in sodium chloride 0.9 % 500 mL Inject 600 mg into the vein every 6 (six) months.    Marland Kitchen OVER THE COUNTER MEDICATION Apply 1 application topically daily as needed (arthritis pain). CBD Oil    . vedolizumab (ENTYVIO) 300 MG injection Inject 300 mg into the vein every 8 (eight) weeks. 1 vial 6   No current facility-administered medications on file  prior to visit.     ALLERGIES: Allergies  Allergen Reactions  . Ibuprofen Other (See Comments)    Avoids due to Crohns disease  . Morphine And Related Itching    itchy  . Prednisone Other (See Comments)    "crazy", hallucinations Tolerates methylprednisolone  . Adhesive [Tape] Rash    Rash with electrodes    FAMILY HISTORY: Family History  Problem Relation Age of Onset  . Hypertension Mother   . Breast cancer Mother 5  . Bone cancer Mother        metastatic from breast  . Depression Mother   . Hypothyroidism Mother   . Colon polyps Father   . Diabetes Father        borderline--resolved 2020  . Hypertension Father   . Hyperlipidemia Father   . Colon cancer Paternal Grandfather   . Multiple sclerosis Sister   . Alcohol abuse Sister   . Fuch's dystrophy Sister   . Stroke Maternal Grandmother   . Lung cancer Maternal Grandmother   . Hypertension Maternal Grandmother   . Heart disease Paternal Grandmother   . Hypertension Paternal Grandmother   . Hypertension Sister   .  SOCIAL HISTORY: Social History   Socioeconomic History  . Marital status: Married    Spouse name: Marcello Moores  . Number of children: 0  . Years of education: 21  . Highest education level: Associate degree: occupational, Hotel manager, or vocational program  Occupational History  . Occupation: Hydrographic surveyor: [employer]  . Financial resource strain: Not on file  . Food insecurity    Worry: Not on file    Inability: Not on file  . Transportation needs    Medical: Not on file    Non-medical: Not on file  Tobacco Use  . Smoking status: Former Smoker    Packs/day: 1.00    Years: 4.00    Pack years: 4.00    Quit date: 11/18/1998    Years since quitting: 20.8  . Smokeless tobacco: Never Used  Substance and Sexual Activity  . Alcohol use: No    Alcohol/week: 0.0 standard drinks  . Drug use: No  . Sexual activity: Yes    Partners: Male    Birth  control/protection: I.U.D.    Comment: Mirena inserted 01/2016  Lifestyle  . Physical activity    Days per week: Not on file    Minutes per session: Not on file  . Stress: Not on file  Relationships  . Social Herbalist on phone: Not on file    Gets together: Not on file    Attends religious service: Not on file  Active member of club or organization: Not on file    Attends meetings of clubs or organizations: Not on file    Relationship status: Not on file  . Intimate partner violence    Fear of current or ex partner: Not on file    Emotionally abused: Not on file    Physically abused: Not on file    Forced sexual activity: Not on file  Other Topics Concern  . Not on file  Social History Narrative   The patient is divorced.     Re-married Marcello Moores, partner of 10 years, on 10/10/16. 2 dogs, 1 cat   No children - doesn't want any   Curator for [employer].   Moved from [place] to [place] in 2013.   Past smoker   No alcohol   2-3 caffeinated beverages a day   She reports she is compliant with sunscreen given her increased risk of sun damage and skin cancer (no longer on azathioprine)      Updated 12/20/2018    REVIEW OF SYSTEMS: Constitutional: No fevers, chills, or sweats, no generalized fatigue, change in appetite Eyes: No visual changes, double vision, eye pain Ear, nose and throat: No hearing loss, ear pain, nasal congestion, sore throat Cardiovascular: No chest pain, palpitations Respiratory:  No shortness of breath at rest or with exertion, wheezes GastrointestinaI: No nausea, vomiting, diarrhea, abdominal pain, fecal incontinence Genitourinary:  No dysuria, urinary retention or frequency Musculoskeletal:  No neck pain, back pain Integumentary: No rash, pruritus, skin lesions Neurological: as above Psychiatric: No depression, insomnia, anxiety Endocrine: No palpitations, fatigue, diaphoresis, mood swings, change in appetite,  change in weight, increased thirst Hematologic/Lymphatic:  No purpura, petechiae. Allergic/Immunologic: no itchy/runny eyes, nasal congestion, recent allergic reactions, rashes  PHYSICAL EXAM: Blood pressure 118/81, pulse 82, height 5' 4"  (1.626 m), weight 234 lb (106.1 kg), SpO2 97 %. General: No acute distress.  Patient appears well-groomed.   Head:  Normocephalic/atraumatic Eyes:  Fundi examined but not visualized Neck: supple, no paraspinal tenderness, full range of motion Heart:  Regular rate and rhythm Lungs:  Clear to auscultation bilaterally Back: No paraspinal tenderness Neurological Exam: alert and oriented to person, place, and time. Attention span and concentration intact, recent and remote memory intact, fund of knowledge intact.  Speech fluent and not dysarthric, language intact.  CN II-XII intact. Bulk and tone normal, muscle strength 5/5 throughout.  Sensation to pinprick and vibration reduced in right lower extremity.  Deep tendon reflexes 2+ throughout, bilateral Babinski.  Finger-nose testing intact.  Spastic gait.  Timed 25 foot walk 9.40 seconds.  Romberg negative.  IMPRESSION: Multiple sclerosis  PLAN: 1.  Ocrevus for DMT 2.  Gabapentin for nerve pain 3.  Ampyra to help with gait 4.  D3 4000 IU daily 5.  Repeat MRI of brain/cervical/thoracic with and without contrast in 6 months. 6.  Repeat vitamin D level in 6 months 7.  Follow up in 6 months (after repeat testing) 8.  Filled out FMLA and form for handicap parking placard.    25 minutes spent face to face with patient, 50% spent discussing management.   Metta Clines, DO  CC:  Rita Ohara, MD

## 2019-09-07 ENCOUNTER — Ambulatory Visit (INDEPENDENT_AMBULATORY_CARE_PROVIDER_SITE_OTHER): Payer: 59 | Admitting: Neurology

## 2019-09-07 ENCOUNTER — Encounter: Payer: Self-pay | Admitting: Neurology

## 2019-09-07 ENCOUNTER — Other Ambulatory Visit: Payer: Self-pay

## 2019-09-07 VITALS — BP 118/81 | HR 82 | Ht 64.0 in | Wt 234.0 lb

## 2019-09-07 DIAGNOSIS — G35 Multiple sclerosis: Secondary | ICD-10-CM

## 2019-09-07 NOTE — Patient Instructions (Addendum)
1.  Continue Ocrevus 2.  Continue gabapentin for pain and Ampyra to help maintain balance. 3.  Continue D3 4000 IU daily 4.  Repeat MRI brain/cervical/thoracic spine with and without contrast in 37month 5.  Repeat vitamin D level in 6 months 6.  Follow up in 6 months (after repeating testing)

## 2019-09-07 NOTE — Progress Notes (Signed)
Form completed and fax to number given

## 2019-09-11 ENCOUNTER — Other Ambulatory Visit: Payer: Self-pay

## 2019-09-11 ENCOUNTER — Encounter: Payer: Self-pay | Admitting: Psychology

## 2019-09-11 ENCOUNTER — Ambulatory Visit (INDEPENDENT_AMBULATORY_CARE_PROVIDER_SITE_OTHER): Payer: 59 | Admitting: Psychology

## 2019-09-11 DIAGNOSIS — G35 Multiple sclerosis: Secondary | ICD-10-CM

## 2019-09-11 NOTE — Progress Notes (Signed)
   Neuropsychology Feedback Session Lindsay Mccarthy. [facility] Department of Neurology  Reason for Referral:   Lindsay Mccarthy a 43 y.o. Caucasian female referred by Metta Clines, D.O.,to characterize hercurrent cognitive functioning and assist with diagnostic clarity and treatment planning in the context of multiple sclerosis (MS) and subjective cognitive decline.  Feedback:   [name] completed a comprehensive neuropsychological evaluation on 09/04/2019. Please refer to that encounter for the full report. Briefly, results suggested neuropsychological functioning largely within normal limits. She did exhibit a weakness across aspects of basic attention/concentration, as well as some performance variability across processing speed. The etiology for aforementioned areas of cognitive weakness could certainly be her history of MS. Additionally, across mood-related questionnaires, she reported mild symptoms of anxiety and moderate symptoms of depression, both of which commonly impact processing speed and attention/concentration. Recommendations included repeat testing in the future should it become necessary and [name] experience cognitive or functional decline, as well as continued individual psychotherapy.  [name] was unaccompanied. Content of the current session focused on the results of the current evaluatiuon. [name] was given the opportunity to ask questions and her questions were answered. She was also encouraged to reach out should additional questions arise.     A total of 10 minutes were spent with [name] during the current virtual feedback session.

## 2019-09-12 ENCOUNTER — Other Ambulatory Visit: Payer: Self-pay | Admitting: Neurology

## 2019-09-12 DIAGNOSIS — G35 Multiple sclerosis: Secondary | ICD-10-CM

## 2019-09-12 NOTE — Telephone Encounter (Signed)
Requested Prescriptions   Pending Prescriptions Disp Refills  . dalfampridine 10 MG TB12 [Pharmacy Med Name: DALFAMPRIDINE  10MG  TAB  EXTENDED RELEASE] 60 tablet 6    Sig: TAKE 1 TABLET BY MOUTH  EVERY 12 HOURS   Rx last filled:09/12/18 #60 6 refills  Pt last seen:09/07/19  Follow up appt scheduled:03/10/20

## 2019-09-15 ENCOUNTER — Telehealth: Payer: 59 | Admitting: Nurse Practitioner

## 2019-09-15 ENCOUNTER — Encounter: Payer: Self-pay | Admitting: Family Medicine

## 2019-09-15 DIAGNOSIS — K591 Functional diarrhea: Secondary | ICD-10-CM

## 2019-09-15 DIAGNOSIS — R5081 Fever presenting with conditions classified elsewhere: Secondary | ICD-10-CM

## 2019-09-15 DIAGNOSIS — R519 Headache, unspecified: Secondary | ICD-10-CM

## 2019-09-15 DIAGNOSIS — R52 Pain, unspecified: Secondary | ICD-10-CM

## 2019-09-15 NOTE — Progress Notes (Signed)
E-Visit for Corona Virus Screening   Your current symptoms could be consistent with the coronavirus.  Many health care providers can now test patients at their office but not all are.  [place] has multiple testing sites. For information on our COVID testing locations and hours go to [email or web address]  Please quarantine yourself while awaiting your test results.  We are enrolling you in our [place] for [place] . Daily you will receive a questionnaire within the [employer] website. Our COVID 19 response team willl be monitoriing your responses daily.  You can go to one of the  testing sites listed below, while they are opened (see hours). You do not need a doctors order to be tested for covid.You do need to self-isolate until your results return and if positive 14 days from when your symptoms started and until you are 3 days symptom free.   Testing Locations (Monday - Friday, 10a.m. - 3:30 p.m.)   [place]: [facility] Entrance), [address] [place], [place]: [place], [place], [place], [place] (entrance off [address] (Closed each Monday): [address], [place], [place] - the short stay covered drive at [facility] (Use the [employer] entrance to [facility] next to [place] is a respiratory illness with symptoms that are similar to the flu. Symptoms are typically mild to moderate, but there have been cases of severe illness and death due to the virus. The following symptoms may appear 2-14 days after exposure: . Fever . Cough . Shortness of breath or difficulty breathing . Chills . Repeated shaking with chills . Muscle pain . Headache . Sore throat . New loss of taste or smell . Fatigue . Congestion or runny nose . Nausea or vomiting . Diarrhea  It is vitally  important that if you feel that you have an infection such as this virus or any other virus that you stay home and away from places where you may spread it to others.  You should self-quarantine for 14 days if you have symptoms that could potentially be coronavirus or have been in close contact a with a person diagnosed with COVID-19 within the last 2 weeks. You should avoid contact with people age 47 and older.   You should wear a mask or cloth face covering over your nose and mouth if you must be around other people or animals, including pets (even at home). Try to stay at least 6 feet away from other people. This will protect the people around you.  You can use medication such as robitussin OTC for cough  You may also take acetaminophen (Tylenol) as needed for fever.   Reduce your risk of any infection by using the same precautions used for avoiding the common cold or flu:  Marland Kitchen Wash your hands often with soap and warm water for at least 20 seconds.  If soap and water are not readily available, use an alcohol-based hand sanitizer with at least 60% alcohol.  . If coughing or sneezing, cover your mouth and nose by coughing or sneezing into the elbow areas of your shirt or coat, into a tissue or into your sleeve (not your hands). . Avoid shaking hands with others and consider head nods or verbal greetings only. . Avoid touching your eyes, nose, or mouth with unwashed hands.  . Avoid close  contact with people who are sick. . Avoid places or events with large numbers of people in one location, like concerts or sporting events. . Carefully consider travel plans you have or are making. . If you are planning any travel outside or inside the [place], visit the CDC's Travelers' Health webpage for the latest health notices. . If you have some symptoms but not all symptoms, continue to monitor at home and seek medical attention if your symptoms worsen. . If you are having a medical emergency, call 911.  HOME  CARE . Only take medications as instructed by your medical team. . Drink plenty of fluids and get plenty of rest. . A steam or ultrasonic humidifier can help if you have congestion.   GET HELP RIGHT AWAY IF YOU HAVE EMERGENCY WARNING SIGNS** FOR COVID-19. If you or someone is showing any of these signs seek emergency medical care immediately. Call 911 or proceed to your closest emergency facility if: . You develop worsening high fever. . Trouble breathing . Bluish lips or face . Persistent pain or pressure in the chest . New confusion . Inability to wake or stay awake . You cough up blood. . Your symptoms become more severe  **This list is not all possible symptoms. Contact your medical provider for any symptoms that are sever or concerning to you.   MAKE SURE YOU   Understand these instructions.  Will watch your condition.  Will get help right away if you are not doing well or get worse.  Your e-visit answers were reviewed by a board certified advanced clinical practitioner to complete your personal care plan.  Depending on the condition, your plan could have included both over the counter or prescription medications.  If there is a problem please reply once you have received a response from your provider.  Your safety is important to [place].  If you have drug allergies check your prescription carefully.    You can use MyChart to ask questions about today's visit, request a non-urgent call back, or ask for a work or school excuse for 24 hours related to this e-Visit. If it has been greater than 24 hours you will need to follow up with your provider, or enter a new e-Visit to address those concerns. You will get an e-mail in the next two days asking about your experience.  I hope that your e-visit has been valuable and will speed your recovery. Thank you for using e-visits.   5-10 minutes spent reviewing and documenting in chart.

## 2019-09-17 ENCOUNTER — Other Ambulatory Visit: Payer: Self-pay

## 2019-09-17 DIAGNOSIS — Z20822 Contact with and (suspected) exposure to covid-19: Secondary | ICD-10-CM

## 2019-09-19 LAB — NOVEL CORONAVIRUS, NAA: SARS-CoV-2, NAA: NOT DETECTED

## 2019-10-04 ENCOUNTER — Other Ambulatory Visit: Payer: Self-pay | Admitting: Neurology

## 2019-10-04 MED ORDER — BACLOFEN 10 MG PO TABS: 10.0000 mg | ORAL_TABLET | Freq: Three times a day (TID) | ORAL | 5 refills | Status: DC | PRN

## 2019-10-18 ENCOUNTER — Other Ambulatory Visit (INDEPENDENT_AMBULATORY_CARE_PROVIDER_SITE_OTHER): Payer: 59

## 2019-10-18 DIAGNOSIS — D500 Iron deficiency anemia secondary to blood loss (chronic): Secondary | ICD-10-CM

## 2019-10-18 LAB — CBC
HCT: 38.4 % (ref 36.0–46.0)
Hemoglobin: 12.5 g/dL (ref 12.0–15.0)
MCHC: 32.7 g/dL (ref 30.0–36.0)
MCV: 88.5 fl (ref 78.0–100.0)
Platelets: 451 K/uL — ABNORMAL HIGH (ref 150.0–400.0)
RBC: 4.33 Mil/uL (ref 3.87–5.11)
RDW: 15.9 % — ABNORMAL HIGH (ref 11.5–15.5)
WBC: 9.5 K/uL (ref 4.0–10.5)

## 2019-10-18 LAB — FERRITIN: Ferritin: 89.5 ng/mL (ref 10.0–291.0)

## 2019-10-19 ENCOUNTER — Other Ambulatory Visit: Payer: Self-pay | Admitting: Family Medicine

## 2019-10-19 DIAGNOSIS — E781 Pure hyperglyceridemia: Secondary | ICD-10-CM

## 2019-10-29 ENCOUNTER — Other Ambulatory Visit: Payer: Self-pay | Admitting: Internal Medicine

## 2019-10-29 DIAGNOSIS — D500 Iron deficiency anemia secondary to blood loss (chronic): Secondary | ICD-10-CM

## 2019-11-20 ENCOUNTER — Other Ambulatory Visit: Payer: Self-pay | Admitting: Internal Medicine

## 2019-11-26 ENCOUNTER — Other Ambulatory Visit: Payer: Self-pay | Admitting: Neurology

## 2019-12-14 ENCOUNTER — Encounter: Payer: Self-pay | Admitting: Family Medicine

## 2019-12-14 DIAGNOSIS — E781 Pure hyperglyceridemia: Secondary | ICD-10-CM

## 2019-12-14 DIAGNOSIS — Z5181 Encounter for therapeutic drug level monitoring: Secondary | ICD-10-CM

## 2019-12-14 DIAGNOSIS — D500 Iron deficiency anemia secondary to blood loss (chronic): Secondary | ICD-10-CM

## 2019-12-18 ENCOUNTER — Other Ambulatory Visit: Payer: 59

## 2019-12-18 ENCOUNTER — Other Ambulatory Visit: Payer: Self-pay

## 2019-12-18 DIAGNOSIS — Z5181 Encounter for therapeutic drug level monitoring: Secondary | ICD-10-CM

## 2019-12-18 DIAGNOSIS — E781 Pure hyperglyceridemia: Secondary | ICD-10-CM

## 2019-12-18 DIAGNOSIS — D500 Iron deficiency anemia secondary to blood loss (chronic): Secondary | ICD-10-CM

## 2019-12-19 LAB — CBC WITH DIFFERENTIAL/PLATELET
Basophils Absolute: 0 10*3/uL (ref 0.0–0.2)
Basos: 0 %
EOS (ABSOLUTE): 0.1 10*3/uL (ref 0.0–0.4)
Eos: 2 %
Hematocrit: 40.7 % (ref 34.0–46.6)
Hemoglobin: 13.1 g/dL (ref 11.1–15.9)
Immature Grans (Abs): 0 10*3/uL (ref 0.0–0.1)
Immature Granulocytes: 0 %
Lymphocytes Absolute: 1.1 10*3/uL (ref 0.7–3.1)
Lymphs: 16 %
MCH: 29 pg (ref 26.6–33.0)
MCHC: 32.2 g/dL (ref 31.5–35.7)
MCV: 90 fL (ref 79–97)
Monocytes Absolute: 0.8 10*3/uL (ref 0.1–0.9)
Monocytes: 11 %
Neutrophils Absolute: 5.3 10*3/uL (ref 1.4–7.0)
Neutrophils: 71 %
Platelets: 475 10*3/uL — ABNORMAL HIGH (ref 150–450)
RBC: 4.52 x10E6/uL (ref 3.77–5.28)
RDW: 13 % (ref 11.7–15.4)
WBC: 7.3 10*3/uL (ref 3.4–10.8)

## 2019-12-19 LAB — LIPID PANEL
Chol/HDL Ratio: 3 ratio (ref 0.0–4.4)
Cholesterol, Total: 158 mg/dL (ref 100–199)
HDL: 53 mg/dL (ref >39)
LDL Chol Calc (NIH): 64 mg/dL (ref 0–99)
Triglycerides: 258 mg/dL — ABNORMAL HIGH (ref 0–149)
VLDL Cholesterol Cal: 41 mg/dL — ABNORMAL HIGH (ref 5–40)

## 2019-12-19 LAB — COMPREHENSIVE METABOLIC PANEL
ALT: 17 IU/L (ref 0–32)
AST: 18 IU/L (ref 0–40)
Albumin/Globulin Ratio: 1.6 (ref 1.2–2.2)
Albumin: 4 g/dL (ref 3.8–4.8)
Alkaline Phosphatase: 53 IU/L (ref 39–117)
BUN/Creatinine Ratio: 10 (ref 9–23)
BUN: 9 mg/dL (ref 6–24)
Bilirubin Total: 0.2 mg/dL (ref 0.0–1.2)
CO2: 24 mmol/L (ref 20–29)
Calcium: 9 mg/dL (ref 8.7–10.2)
Chloride: 103 mmol/L (ref 96–106)
Creatinine, Ser: 0.88 mg/dL (ref 0.57–1.00)
GFR calc Af Amer: 94 mL/min/{1.73_m2} (ref >59)
GFR calc non Af Amer: 82 mL/min/{1.73_m2} (ref >59)
Globulin, Total: 2.5 g/dL (ref 1.5–4.5)
Glucose: 80 mg/dL (ref 65–99)
Potassium: 4.2 mmol/L (ref 3.5–5.2)
Sodium: 141 mmol/L (ref 134–144)
Total Protein: 6.5 g/dL (ref 6.0–8.5)

## 2019-12-19 LAB — FERRITIN: Ferritin: 68 ng/mL (ref 15–150)

## 2019-12-21 ENCOUNTER — Ambulatory Visit: Payer: 59 | Admitting: Internal Medicine

## 2019-12-21 NOTE — Progress Notes (Signed)
Chief Complaint  Patient presents with  . Annual Exam    nonfasting annual exam, no pap-sees Dr. Quincy Simmonds. Had labs done last week. No new concerns.     Lindsay Mccarthy is a 44 y.o. female who presents for a complete physical.  She had labs done prior to her visit, see below.  She has no specific concerns today.   She has h/oDVT and PE, remains onEliquiswithout complication. She denies bleeding or bruising. Denies shortness of breath, chest pain or leg swelling.  Hypertriglyceridemia:  She is compliant with medications(fenofibrate)and denies side effects. TG's have remained above goal.  She has seen nutritionist (last in October), and was given info on healthier snacks, and reports she has been doing pretty well with her diet. At that time, she was also using [employer] program for weight loss. She since went back to doing [employer]. She previously took omega-3 fish oil--can't recall why she stopped taking fish oil, was a while ago. (chart reviewed--likely related to starting anticoagulant).  Depression follow-up:She remains onLexapro67m daily andWellbutrin [id number]. PHQ-9 score was 7 at last visit in September.  At that time she felt like her moods were well controlled.She continues to see a counselor, but going as needed, about once a month or less.  She is compliant with her meds, and didn't tolerate the higher dose of wellbutrin in the past. She reports she is still doing well, last visit with therapist was about 6 weeks ago.  Vitamin D deficiency--found to be low at 21 in 09/2018, checked by Dr. JaTomi LikensFollow-up was 31.8 in 07/2019, when taking 4000-5000 IU daily.  She is currently taking 4000 IU daily.  No missed doses, see results below.  MS--last saw Dr. JaTomi Likensn 09/2019. She also underwent neuropsych testing at that time, which was fairly normal. She continues on Ocrevus, gabapentin, Ampyra as well as baclofen. Plan is to recheck MRI (brain/cervical/thoracic) and f/u with  him in 03/2020. Incontinence has improved, which seems to be related to controlling triggers (stress, change in routine).  Has occurred a few times in the last year, last was yesterday.  Gets no urge prior. She reports her gait has gotten a little worse. Uses her cane regularly. She continues to have some falls, last was in November.    Crohn's Disease:  Under the care of Dr. GeCarlean Purlvisit was r/s to March due to weather last week).  She is doing well on EnDominicaShe uses colestipol infrequently for diarrhea (doesn't like how she feels, mainly uses if traveling or when bathrooms won't be convenient).  He has been treating/monitoring for iron deficiency anemia, s/p FeraHeme treatments in the past--she reports that with her last treatment, med was changed for insurance reasons, infusion was 3 separate treatments, and she developed fever afterwards. She is worried about recurrent issues if she has to use same medication again (prefers Ferra-Heme).  Pulmonary nodule noted on CT 07/2018.  Had CT follow-up in October and pulmonary nodule was unchanged.  1 year f/u was recommended to assess stability. She denies any cough, shortness of breath.  History of abnormal paps, HPV. Treated with LEEP in 2011.She is under the care of Dr. SiQuincy Simmondsor her GYN care, last seen 04/2019.She had normal paps in 12/2017 and 04/2019, with no HPV detected.  She had herMirena IUD replaced in April 2017.She has never been pregnant, and doesn't want children.   Osteopenia:DEXA11/2014showedT -1.6. She had repeat study 10/2016.  She has since had Vitamin D deficiency diagnosed/treated.  She  continues to drink 1 glass of almond milk daily, as well as daily cottage cheese and greens.   She is not currently taking calcium supplements, just Vitamin D3.   Results:  Lumbar spine (L1-L4) Femoral neck (FN) 33% distal radius  T-score 1.4 RFN:-1.3 LFN:-1.1 n/a  Change in BMD from previous DXA test (%) Up 7.8% Down 0.1% n/a  (*)  statistically significant   Immunization History  Administered Date(s) Administered  . Hepatitis A 06/29/2012, 01/04/2013  . Influenza Split 08/09/2012  . Influenza,inj,Quad PF,6+ Mos 09/18/2013, 10/07/2014, 12/15/2015, 08/02/2016, 10/05/2017, 07/18/2019  . PPD Test 04/30/2013, 10/07/2014  . Pneumococcal Conjugate-13 04/12/2018  . Pneumococcal Polysaccharide-23 07/06/2012  . Tdap 12/15/2015   Last Pap smear: 04/2019, normal with no high risk HPV Last mammogram:12/2018 Last colonoscopy:06/2017, Dr. Carlean Purl Last DEXA:10/2016 Ophtho: yearly Dentist: twice yearly  Exercise: 20 minutes daily on her treadmill.   Not using hand weights recently. Sometimes marches in place, if feeling steady. She sees dermatologist (Dr. Derrel Nip) regularly   [place], [place], [place] and [place] reviewed and updated  Outpatient Encounter Medications as of 12/24/2019  Medication Sig Note  . apixaban (ELIQUIS) 5 MG TABS tablet TAKE 1 TABLET(5 MG) BY MOUTH TWICE DAILY   . baclofen (LIORESAL) 10 MG tablet Take 1 tablet (10 mg total) by mouth 3 (three) times daily as needed for muscle spasms. 12/24/2019: Usually twice daily  . Biotin 10000 MCG TABS Take by mouth.   Marland Kitchen buPROPion (WELLBUTRIN SR) 100 MG 12 hr tablet Take 1 tablet (100 mg total) by mouth 2 (two) times daily.   . cetirizine (ZYRTEC) 10 MG tablet Take 10 mg by mouth at bedtime.    . Cholecalciferol (VITAMIN D3) 2000 units TABS Take 3,000 Units by mouth every evening.  12/24/2019: Takes 2000 IU tablet twice daily (total dose 4000)  . cyanocobalamin (,VITAMIN B-12,) 1000 MCG/ML injection INJECT 1 ML IN THE MUSCLE WEEKLY FOR 4 WEEKS, THEN 1 ML MONTHLY THEREAFTER   . dalfampridine 10 MG TB12 TAKE 1 TABLET BY MOUTH  EVERY 12 HOURS 12/24/2019: Ampyra  . escitalopram (LEXAPRO) 10 MG tablet TAKE 1 TABLET(10 MG) BY MOUTH AT BEDTIME   . fenofibrate 160 MG tablet TAKE 1 TABLET(160 MG) BY MOUTH AT BEDTIME   . gabapentin (NEURONTIN) 100 MG capsule TAKE 3 CAPSULES(300 MG) BY  MOUTH AT BEDTIME   . levonorgestrel (MIRENA) 20 MCG/24HR IUD 1 each by Intrauterine route once. Implanted April 2017   . ocrelizumab 600 mg in sodium chloride 0.9 % 500 mL Inject 600 mg into the vein every 6 (six) months. 07/18/2019: Ocrevus Infusion q 6 months  . vedolizumab (ENTYVIO) 300 MG injection Inject 300 mg into the vein every 8 (eight) weeks.   Marland Kitchen acetaminophen (TYLENOL) 500 MG tablet Take 1,000 mg by mouth every 6 (six) hours as needed for headache (pain). Reported on 12/15/2015   . colestipol (COLESTID) 5 g packet TAKE 5 GRAMS BY MOUTH DAILY (Patient not taking: Reported on 12/24/2019) 12/24/2019: As needed, hasn't needed in a couple of months (uses with travel)  . diphenhydrAMINE (BENADRYL) 25 MG tablet Take 25 mg by mouth at bedtime as needed for sleep.   Marland Kitchen OVER THE COUNTER MEDICATION Apply 1 application topically daily as needed (arthritis pain). CBD Oil    No facility-administered encounter medications on file as of 12/24/2019.   Allergies  Allergen Reactions  . Ibuprofen Other (See Comments)    Avoids due to Crohns disease  . Morphine And Related Itching    itchy  .  Prednisone Other (See Comments)    "crazy", hallucinations Tolerates methylprednisolone  . Adhesive [Tape] Rash    Rash with electrodes    ROS: The patient denies anorexia, fever, headaches, vision changes, decreased hearing, ear pain, sore throat, breast concerns, chest pain, palpitations, dizziness (occasionally if she moves too quickly), syncope, cough, nausea, vomiting, indigestion/heartburn, hematuria, dysuria, vaginal bleeding,discharge, odor or itch, genital lesions, numbness, tingling, tremor, suspicious skin lesions, abnormal bleeding/bruising or enlarged lymph nodes. Has Mirena, rare spotting on occasion, never heavy Crohn'sis controlled, no bleeding. Having BM's only twice a day, sometimes notes a lump on the LLQ, has upcoming appt with GI. Stools are like "pudding". Chronic allergies, controlled by  OTC meds.  Gait instability and incontinence per HPI Some trouble seeing at night, unchanged No joint pains (slight in hip and knees, weather related).  Depression is controlled   PHYSICAL EXAM:  BP 110/70   Pulse 84   Temp (!) 96 F (35.6 C) (Other (Comment))   Ht 5' 4"  (1.626 m)   Wt 229 lb (103.9 kg)   BMI 39.31 kg/m   Wt Readings from Last 3 Encounters:  12/24/19 229 lb (103.9 kg)  09/07/19 234 lb (106.1 kg)  08/21/19 238 lb (108 kg)    General Appearance:   Alert, cooperative, no distress, appears stated age  Head:   Normocephalic, without obvious abnormality, atraumatic  Eyes:   PERRL, conjunctiva/corneas clear, EOM's intact, fundi benign  Ears:   Normal TM's and external ear canals  Nose:  Not examined, wearing mask due to COVID-19 pandemic  Throat:  Not examined, wearing mask due to COVID-19 pandemic  Neck:  Supple, no lymphadenopathy; thyroid: no enlargement/tenderness/ nodules; no carotid bruit or JVD  Back:  Spine nontender, no curvature, ROM normal, noCVA tenderness.  Lungs:   Clear to auscultation bilaterally without wheezes, rales or ronchi; respirations unlabored  Chest Wall:   No tenderness or deformity  Heart:   Regular rate and rhythm, S1 and S2 normal, no murmur, rub or gallop  Breast Exam:   Deferred to GYN  Abdomen:   Soft, nondistended, normoactive bowel sounds, no masses, no hepatosplenomegaly. Nontender.  Genitalia:  deferred to GYN  Rectal:   deferred  Extremities:  No clubbing, cyanosis or edema.   Pulses:  2+ and symmetric all extremities  Skin:  Skin color, texture, turgor normal, no rashes or lesions. Exam is limited, not changed into gown for visit today.  Lymph nodes:  Cervical, supraclavicular, and axillary nodes normal  Neurologic:  CNII-XII intact, normal strength, sensation; reflexes 2+ and symmetric throughout.    Psych: Normal mood, affect,  hygiene and grooming   PHQ-9 score of 1    Lab Results  Component Value Date   CHOL 158 12/18/2019   HDL 53 12/18/2019   LDLCALC 64 12/18/2019   TRIG 258 (H) 12/18/2019   CHOLHDL 3.0 12/18/2019   Fasting glucose 80 Vit D-OH 29.4    Chemistry      Component Value Date/Time   NA 141 12/18/2019 0820   K 4.2 12/18/2019 0820   CL 103 12/18/2019 0820   CO2 24 12/18/2019 0820   BUN 9 12/18/2019 0820   CREATININE 0.88 12/18/2019 0820   CREATININE 0.92 03/14/2018 1435   CREATININE 0.81 12/15/2015 0001      Component Value Date/Time   CALCIUM 9.0 12/18/2019 0820   ALKPHOS 53 12/18/2019 0820   AST 18 12/18/2019 0820   AST 17 03/14/2018 1435   ALT 17 12/18/2019 0820  ALT 12 03/14/2018 1435   BILITOT 0.2 12/18/2019 0820   BILITOT 0.3 03/14/2018 1435     Lab Results  Component Value Date   WBC 7.3 12/18/2019   HGB 13.1 12/18/2019   HCT 40.7 12/18/2019   MCV 90 12/18/2019   PLT 475 (H) 12/18/2019   Lab Results  Component Value Date   FERRITIN 68 12/18/2019     ASSESSMENT/PLAN:  Annual physical exam - Plan: POCT Urinalysis DIP (Proadvantage Device)  Vitamin D deficiency - borderline low; rec increasing to 5000 IU daily (vs 6000 IU with her current 2000 IU tabs, through end of March) - Plan: VITAMIN D 25 Hydroxy (Vit-D Deficiency, Fractures)  Pulmonary nodule - stable on last CT, recheck 08/2020  Multiple sclerosis ([place])  Crohn's disease of small intestine without complication (HCC)  Long-term use of immunosuppressant medication Entyvio + ocrelizumab  Anticoagulant long-term use - Plan: apixaban (ELIQUIS) 5 MG TABS tablet  Osteopenia, unspecified location - counseled re: dietary Ca, D, weight-bearing exercise  Elevated platelet count - discussed ddx, only mildly elevated and on anticoagulants.  therefore ASA not rec  Major depressive disorder in partial remission, unspecified whether recurrent (HCC) - PHQ-9 score of 1 (improved since last visit); cont  current med regimen and counseling - Plan: buPROPion (WELLBUTRIN SR) 100 MG 12 hr tablet, escitalopram (LEXAPRO) 10 MG tablet  History of pulmonary embolus (PE) - continue anticoagulants - Plan: apixaban (ELIQUIS) 5 MG TABS tablet  Hypertriglyceridemia - continue fenofibrate. Reviewed lowfat diet, goals.  fish oiil not recommended due to increased risk of bleeding on anticoagulants - Plan: fenofibrate 160 MG tablet, Lipid panel  Class 2 severe obesity due to excess calories with serious comorbidity and body mass index (BMI) of 39.0 to 39.9 in adult [facility]) - encouraged proper diet, exercise, weight loss. Cont [employer]    Discussed monthly self breast exams and yearly mammograms; at least 30 minutes of aerobic activity at least 5 days/week including weight-bearing exercise at least twice per week; proper sunscreen use reviewed; healthy diet, including goals of calcium and vitamin D intake and alcohol recommendations (less than or equal to 1 drink/day) reviewed; regular seatbelt use; changing batteries in smoke detectors. Immunization recommendations discussed--UTD; continue yearly flu shots. Colonoscopy recommendations reviewed--per Dr. Carlean Purl. COVID vaccine recommended when available to her. She has already discussed the timing of the vaccine with regard to her medications with her other doctors.   Triglycerides were elevated.  We prefer to avoid omega-3 given the potential increased risk for bleeding while on eliquis.  Please work hard to limit the sugar/sweets/fried/greasy foods in your diet.  Continue the fenofibrate.  We will recheck in 6 months, sooner if you prefer.   F/u  For 6 month med check fasting labs prior--lipids and D entered.  She will check back with [place] prior to labs drawn to see if anything else needed/due (ie CBC, ferritin).

## 2019-12-24 ENCOUNTER — Encounter: Payer: Self-pay | Admitting: Family Medicine

## 2019-12-24 ENCOUNTER — Ambulatory Visit (INDEPENDENT_AMBULATORY_CARE_PROVIDER_SITE_OTHER): Payer: 59 | Admitting: Family Medicine

## 2019-12-24 ENCOUNTER — Other Ambulatory Visit: Payer: Self-pay

## 2019-12-24 VITALS — BP 110/70 | HR 84 | Temp 96.0°F | Ht 64.0 in | Wt 229.0 lb

## 2019-12-24 DIAGNOSIS — Z7960 Long term (current) use of unspecified immunomodulators and immunosuppressants: Secondary | ICD-10-CM

## 2019-12-24 DIAGNOSIS — Z79899 Other long term (current) drug therapy: Secondary | ICD-10-CM

## 2019-12-24 DIAGNOSIS — E559 Vitamin D deficiency, unspecified: Secondary | ICD-10-CM | POA: Diagnosis not present

## 2019-12-24 DIAGNOSIS — R911 Solitary pulmonary nodule: Secondary | ICD-10-CM

## 2019-12-24 DIAGNOSIS — M8580 Other specified disorders of bone density and structure, unspecified site: Secondary | ICD-10-CM

## 2019-12-24 DIAGNOSIS — R7989 Other specified abnormal findings of blood chemistry: Secondary | ICD-10-CM

## 2019-12-24 DIAGNOSIS — Z7901 Long term (current) use of anticoagulants: Secondary | ICD-10-CM

## 2019-12-24 DIAGNOSIS — Z0000 Encounter for general adult medical examination without abnormal findings: Secondary | ICD-10-CM

## 2019-12-24 DIAGNOSIS — Z86711 Personal history of pulmonary embolism: Secondary | ICD-10-CM

## 2019-12-24 DIAGNOSIS — Z6839 Body mass index (BMI) 39.0-39.9, adult: Secondary | ICD-10-CM

## 2019-12-24 DIAGNOSIS — K5000 Crohn's disease of small intestine without complications: Secondary | ICD-10-CM

## 2019-12-24 DIAGNOSIS — F324 Major depressive disorder, single episode, in partial remission: Secondary | ICD-10-CM

## 2019-12-24 DIAGNOSIS — E781 Pure hyperglyceridemia: Secondary | ICD-10-CM

## 2019-12-24 DIAGNOSIS — G35 Multiple sclerosis: Secondary | ICD-10-CM | POA: Diagnosis not present

## 2019-12-24 LAB — POCT URINALYSIS DIP (PROADVANTAGE DEVICE)
Blood, UA: NEGATIVE
Glucose, UA: NEGATIVE mg/dL
Ketones, POC UA: NEGATIVE mg/dL
Leukocytes, UA: NEGATIVE
Nitrite, UA: NEGATIVE
Protein Ur, POC: NEGATIVE mg/dL
Specific Gravity, Urine: 1.03
Urobilinogen, Ur: NEGATIVE
pH, UA: 6 (ref 5.0–8.0)

## 2019-12-24 LAB — SPECIMEN STATUS REPORT

## 2019-12-24 LAB — VITAMIN D 25 HYDROXY (VIT D DEFICIENCY, FRACTURES): Vit D, 25-Hydroxy: 29.4 ng/mL — ABNORMAL LOW (ref 30.0–100.0)

## 2019-12-24 MED ORDER — FENOFIBRATE 160 MG PO TABS: ORAL_TABLET | ORAL | 1 refills | Status: DC

## 2019-12-24 MED ORDER — BUPROPION HCL ER (SR) 100 MG PO TB12: 100.0000 mg | ORAL_TABLET | Freq: Two times a day (BID) | ORAL | 3 refills | Status: DC

## 2019-12-24 MED ORDER — ESCITALOPRAM OXALATE 10 MG PO TABS: ORAL_TABLET | ORAL | 3 refills | Status: DC

## 2019-12-24 MED ORDER — APIXABAN 5 MG PO TABS: ORAL_TABLET | ORAL | 1 refills | Status: DC

## 2019-12-24 NOTE — Patient Instructions (Addendum)
HEALTH MAINTENANCE RECOMMENDATIONS:  It is recommended that you get at least 30 minutes of aerobic exercise at least 5 days/week (for weight loss, you may need as much as 60-90 minutes). This can be any activity that gets your heart rate up. This can be divided in 10-15 minute intervals if needed, but try and build up your endurance at least once a week.  Weight bearing exercise is also recommended twice weekly.  Eat a healthy diet with lots of vegetables, fruits and fiber.  "Colorful" foods have a lot of vitamins (ie green vegetables, tomatoes, red peppers, etc).  Limit sweet tea, regular sodas and alcoholic beverages, all of which has a lot of calories and sugar.  Up to 1 alcoholic drink daily may be beneficial for women (unless trying to lose weight, watch sugars).  Drink a lot of water.  Calcium recommendations are 1200-1500 mg daily (1500 mg for postmenopausal women or women without ovaries), and vitamin D 1000 IU daily.  This should be obtained from diet and/or supplements (vitamins), and calcium should not be taken all at once, but in divided doses.  Monthly self breast exams and yearly mammograms for women over the age of 18 is recommended.  Sunscreen of at least SPF 30 should be used on all sun-exposed parts of the skin when outside between the hours of 10 am and 4 pm (not just when at beach or pool, but even with exercise, golf, tennis, and yard work!)  Use a sunscreen that says "broad spectrum" so it covers both UVA and UVB rays, and make sure to reapply every 1-2 hours.  Remember to change the batteries in your smoke detectors when changing your clock times in the spring and fall. Carbon monoxide detectors are recommended for your home.  Use your seat belt every time you are in a car, and please drive safely and not be distracted with cell phones and texting while driving.  Triglycerides were elevated.  We prefer to avoid omega-3 given the potential increased risk for bleeding while on  eliquis.  Please work hard to limit the sugar/sweets/fried/greasy foods in your diet.  Continue the fenofibrate.  We will recheck in 6 months, sooner if you prefer.   High Triglycerides Eating Plan Triglycerides are a type of fat in the blood. High levels of triglycerides can increase your risk of heart disease and stroke. If your triglyceride levels are high, choosing the right foods can help lower your triglycerides and keep your heart healthy. Work with your health care provider or a diet and nutrition specialist (dietitian) to develop an eating plan that is right for you. What are tips for following this plan? General guidelines   Lose weight, if you are overweight. For most people, losing 5-10 lbs (2-5 kg) helps lower triglyceride levels. A weight-loss plan may include. ? 30 minutes of exercise at least 5 days a week. ? Reducing the amount of calories, sugar, and fat you eat.  Eat a wide variety of fresh fruits, vegetables, and whole grains. These foods are high in fiber.  Eat foods that contain healthy fats, such as fatty fish, nuts, seeds, and olive oil.  Avoid foods that are high in added sugar, added salt (sodium), saturated fat, and trans fat.  Avoid low-fiber, refined carbohydrates such as white bread, crackers, noodles, and white rice.  Avoid foods with partially hydrogenated oils (trans fats), such as fried foods or stick margarine.  Limit alcohol intake to no more than 1 drink a day for nonpregnant  women and 2 drinks a day for men. One drink equals 12 oz of beer, 5 oz of wine, or 1 oz of hard liquor. Your health care provider may recommend that you drink less depending on your overall health. Reading food labels  Check food labels for the amount of saturated fat. Choose foods with no or very little saturated fat.  Check food labels for the amount of trans fat. Choose foods with no trans fat.  Check food labels for the amount of cholesterol. Choose foods low in  cholesterol. Ask your dietitian how much cholesterol you should have each day.  Check food labels for the amount of sodium. Choose foods with less than 140 milligrams (mg) per serving. Shopping  Buy dairy products labeled as nonfat (skim) or low-fat (1%).  Avoid buying processed or prepackaged foods. These are often high in added sugar, sodium, and fat. Cooking  Choose healthy fats when cooking, such as olive oil or canola oil.  Cook foods using lower fat methods, such as baking, broiling, boiling, or grilling.  Make your own sauces, dressings, and marinades when possible, instead of buying them. Store-bought sauces, dressings, and marinades are often high in sodium and sugar. Meal planning  Eat more home-cooked food and less restaurant, buffet, and fast food.  Eat fatty fish at least 2 times each week. Examples of fatty fish include salmon, trout, mackerel, tuna, and herring.  If you eat whole eggs, do not eat more than 3 egg yolks per week. What foods are recommended? The items listed may not be a complete list. Talk with your dietitian about what dietary choices are best for you. Grains Whole wheat or whole grain breads, crackers, cereals, and pasta. Unsweetened oatmeal. Bulgur. Barley. Quinoa. Brown rice. Whole wheat flour tortillas. Vegetables Fresh or frozen vegetables. Low-sodium canned vegetables. Fruits All fresh, canned (in natural juice), or frozen fruits. Meats and other protein foods Skinless chicken or [place]. Ground chicken or [place]. Lean cuts of pork, trimmed of fat. Fish and seafood, especially salmon, trout, and herring. Egg whites. Dried beans, peas, or lentils. Unsalted nuts or seeds. Unsalted canned beans. Natural peanut or almond butter. Dairy Low-fat dairy products. Skim or low-fat (1%) milk. Reduced fat (2%) and low-sodium cheese. Low-fat ricotta cheese. Low-fat cottage cheese. Plain, low-fat yogurt. Fats and oils Tub margarine without trans fats. Light or  reduced-fat mayonnaise. Light or reduced-fat salad dressings. Avocado. Safflower, olive, sunflower, soybean, and canola oils. What foods are not recommended? The items listed may not be a complete list. Talk with your dietitian about what dietary choices are best for you. Grains White bread. White (regular) pasta. White rice. Cornbread. Bagels. Pastries. Crackers that contain trans fat. Vegetables Creamed or fried vegetables. Vegetables in a cheese sauce. Fruits Sweetened dried fruit. Canned fruit in syrup. Fruit juice. Meats and other protein foods Fatty cuts of meat. Ribs. Chicken wings. Berniece Salines. Sausage. Bologna. Salami. Chitterlings. Fatback. Hot dogs. Bratwurst. Packaged lunch meats. Dairy Whole or reduced-fat (2%) milk. Half-and-half. Cream cheese. Full-fat or sweetened yogurt. Full-fat cheese. Nondairy creamers. Whipped toppings. Processed cheese or cheese spreads. Cheese curds. Beverages Alcohol. Sweetened drinks, such as soda, lemonade, fruit drinks, or punches. Fats and oils Butter. Stick margarine. Lard. Shortening. Ghee. Bacon fat. Tropical oils, such as coconut, palm kernel, or palm oils. Sweets and desserts Corn syrup. Sugars. Honey. Molasses. Candy. Jam and jelly. Syrup. Sweetened cereals. Cookies. Pies. Cakes. Donuts. Muffins. Ice cream. Condiments Store-bought sauces, dressings, and marinades that are high in sugar, such as ketchup  and barbecue sauce. Summary  High levels of triglycerides can increase the risk of heart disease and stroke. Choosing the right foods can help lower your triglycerides.  Eat plenty of fresh fruits, vegetables, and whole grains. Choose low-fat dairy and lean meats. Eat fatty fish at least twice a week.  Avoid processed and prepackaged foods with added sugar, sodium, saturated fat, and trans fat.  If you need suggestions or have questions about what types of food are good for you, talk with your health care provider or a dietitian. This  information is not intended to replace advice given to you by your health care provider. Make sure you discuss any questions you have with your health care provider. Document Revised: 09/30/2017 Document Reviewed: 12/21/2016 Elsevier Patient Education  2020 [employer].

## 2020-01-22 ENCOUNTER — Encounter: Payer: Self-pay | Admitting: Internal Medicine

## 2020-01-22 ENCOUNTER — Ambulatory Visit (INDEPENDENT_AMBULATORY_CARE_PROVIDER_SITE_OTHER): Payer: 59 | Admitting: Internal Medicine

## 2020-01-22 ENCOUNTER — Other Ambulatory Visit: Payer: Self-pay

## 2020-01-22 VITALS — BP 102/68 | HR 80 | Temp 97.9°F | Ht 64.0 in | Wt 233.4 lb

## 2020-01-22 DIAGNOSIS — Z79899 Other long term (current) drug therapy: Secondary | ICD-10-CM

## 2020-01-22 DIAGNOSIS — K9089 Other intestinal malabsorption: Secondary | ICD-10-CM | POA: Diagnosis not present

## 2020-01-22 DIAGNOSIS — D500 Iron deficiency anemia secondary to blood loss (chronic): Secondary | ICD-10-CM

## 2020-01-22 DIAGNOSIS — K50012 Crohn's disease of small intestine with intestinal obstruction: Secondary | ICD-10-CM | POA: Diagnosis not present

## 2020-01-22 DIAGNOSIS — Z7960 Long term (current) use of unspecified immunomodulators and immunosuppressants: Secondary | ICD-10-CM

## 2020-01-22 NOTE — Progress Notes (Signed)
Lindsay Mccarthy 44 y.o. 04-Jan-1976 [id number]  Assessment & Plan:   Crohn's disease of small intestine ([place]) It sounds to me and her like she had a transient partial small bowel obstruction that has resolved.  Could have been adhesions could have been Crohn's.  She is better now and she prefers to observe and I think that is fine.  If this is recurrent then we need to think about additional lab work-up, imaging with hopefully what I would do would be an MR enterography versus colonoscopy.  Otherwise return in 6 months.  She is only using the colestipol as needed with travel so that is not the culprit.  Iron deficiency anemia due to chronic blood loss Ferritin and hemoglobin acceptable in February.  Recheck in early June.  She had what sounds like an infusion reaction with the Venofer.  Hopefully this would mean she can get Feraheme or other less reactive agent in the future should she need it.  She has [place] healthcare which has become more restrictive on iron infusion agents.  Bile salt-induced diarrhea Colestipol as needed travel  Long-term use of immunosuppressant medication Entyvio + ocrelizumab No problems noted.  She plans to get the Covid vaccine in the future but wants to be somewhat distant from her ocrelizumab infusion.  Rockey Situ, MD    Subjective:   Chief Complaint: f/u Crohn's, abd pain and reduced BM's  HPI The other week she had several days of periumbilical and LUQ pain, reduced from 4-5 soft BM to 1-2 a day. Spontaneously resolved. When had Venofer for iron deficiency last time she had a fever for 3 days and does not want that again Allergies  Allergen Reactions  . Venofer [Iron Sucrose] Other (See Comments)    Fever, infusion reaction   . Ibuprofen Other (See Comments)    Avoids due to Crohns disease  . Morphine And Related Itching    itchy  . Prednisone Other (See Comments)    "crazy", hallucinations Tolerates methylprednisolone  . Adhesive  [Tape] Rash    Rash with electrodes   Current Meds  Medication Sig  . acetaminophen (TYLENOL) 500 MG tablet Take 1,000 mg by mouth every 6 (six) hours as needed for headache (pain). Reported on 12/15/2015  . apixaban (ELIQUIS) 5 MG TABS tablet TAKE 1 TABLET(5 MG) BY MOUTH TWICE DAILY  . baclofen (LIORESAL) 10 MG tablet Take 1 tablet (10 mg total) by mouth 3 (three) times daily as needed for muscle spasms.  . Biotin 10000 MCG TABS Take by mouth.  Marland Kitchen buPROPion (WELLBUTRIN SR) 100 MG 12 hr tablet Take 1 tablet (100 mg total) by mouth 2 (two) times daily.  . cetirizine (ZYRTEC) 10 MG tablet Take 10 mg by mouth at bedtime.   . Cholecalciferol (VITAMIN D3) 2000 units TABS Take 3,000 Units by mouth every evening.   . colestipol (COLESTID) 5 g packet TAKE 5 GRAMS BY MOUTH DAILY  . cyanocobalamin (,VITAMIN B-12,) 1000 MCG/ML injection INJECT 1 ML IN THE MUSCLE WEEKLY FOR 4 WEEKS, THEN 1 ML MONTHLY THEREAFTER  . dalfampridine 10 MG TB12 TAKE 1 TABLET BY MOUTH  EVERY 12 HOURS  . diphenhydrAMINE (BENADRYL) 25 MG tablet Take 25 mg by mouth at bedtime as needed for sleep.  Marland Kitchen escitalopram (LEXAPRO) 10 MG tablet TAKE 1 TABLET(10 MG) BY MOUTH AT BEDTIME  . fenofibrate 160 MG tablet TAKE 1 TABLET(160 MG) BY MOUTH AT BEDTIME  . gabapentin (NEURONTIN) 100 MG capsule TAKE 3 CAPSULES(300 MG) BY MOUTH AT  BEDTIME  . levonorgestrel (MIRENA) 20 MCG/24HR IUD 1 each by Intrauterine route once. Implanted April 2017  . ocrelizumab 600 mg in sodium chloride 0.9 % 500 mL Inject 600 mg into the vein every 6 (six) months.  Marland Kitchen OVER THE COUNTER MEDICATION Apply 1 application topically daily as needed (arthritis pain). CBD Oil  . vedolizumab (ENTYVIO) 300 MG injection Inject 300 mg into the vein every 8 (eight) weeks.   Past Medical History:  Diagnosis Date  . Anemia    related to Crohns flares  . Arm DVT (deep venous thromboembolism), acute, right ([place]) 11/29/2017  . Arthritis    knees, feet, hands, wrists, related to Crohns  flare  . Asthma    Childhood  . B12 deficiency    monitored/treated by Dr. Carlean Purl  . Cervical dysplasia   . Crohn's disease of small intestine ([place]) 06/24/2012   Diagnosed 1999, in [place]. Ileitis only then. Treated with Imuran Remicade and prednisone.  Noncompliant with therapy. 2004 return to care and was treated with Remicade prednisone Pentasa Cipro and Flagyl. 2006 status post right hemicolectomy. Subsequently treated with azathioprine and Cimzia. 200 mg every other week.  azathioprine was added in 2010. Prometheus TP MT enzyme was negative.  . Depression   . DVT of upper extremity (deep vein thrombosis) ([place]) 11/28/2017  . Eczema    arms and behind knees, worse in winter  . Generalized anxiety disorder   . History of recurrent UTI (urinary tract infection)   . HPV (human papilloma virus) infection   . Hyperlipemia   . Hypertriglyceridemia 07/03/2012   10/2010 labs showed level of 753 with total cholesterol 180 and HDL 41' Labs 05/2013--TG 180   . Major depressive disorder in remission ([place]) 10/03/2013  . Multiple sclerosis ([place]) 2019   Officially diagnosed in the Fall  . Neuromuscular disorder ([place])   . Osteopenia 09/29/2011   T-1.6  . Papanicolaou smear 12/12   last abnormal 2011  . Pulmonary embolism ([place]) 11/30/2017  . Scaphoid fracture of wrist 2013   left  . Seasonal allergic rhinitis   . Shingles 09/2015   R hip  . Vitamin B12 deficiency 06/19/2012  . Vitamin D deficiency 12/16/2015   Vitamin D-OH level 12    Past Surgical History:  Procedure Laterality Date  . APPENDECTOMY    . CERVICAL BIOPSY  W/ LOOP ELECTRODE EXCISION  2009   ---paps normal since  . COLONOSCOPY  multiple   scanned  . ESOPHAGOGASTRODUODENOSCOPY  multiple   scanned  . HEMICOLECTOMY  2006  . IR ANGIOGRAM PULMONARY BILATERAL SELECTIVE  12/03/2017  . IR ANGIOGRAM SELECTIVE EACH ADDITIONAL VESSEL  12/03/2017  . IR ANGIOGRAM SELECTIVE EACH ADDITIONAL VESSEL  12/03/2017  . IR INFUSION THROMBOL  ARTERIAL INITIAL (MS)  12/03/2017  . IR INFUSION THROMBOL ARTERIAL INITIAL (MS)  12/03/2017  . IR IVC FILTER PLMT / S&I /IMG GUID/MOD SED  12/04/2017  . IR THROMB F/U EVAL ART/VEN FINAL DAY (MS)  12/04/2017  . IR US GUIDE VASC ACCESS RIGHT  12/03/2017  . IVC FILTER REMOVAL N/A 07/11/2018   Procedure: IVC FILTER REMOVAL;  Surgeon: Serafina Mitchell, MD;  Location: [facility];  Service: Cardiovascular;  Laterality: N/A;  . IVC VENOGRAPHY N/A 07/11/2018   Procedure: IVC Venography;  Surgeon: Serafina Mitchell, MD;  Location: [facility];  Service: Cardiovascular;  Laterality: N/A;  . LEEP  2011   --done in [place]  . PERIPHERAL VASCULAR BALLOON ANGIOPLASTY Right 11/30/2017   Procedure:  PERIPHERAL VASCULAR BALLOON ANGIOPLASTY;  Surgeon: Serafina Mitchell, MD;  Location: [facility];  Service: Cardiovascular;  Laterality: Right;  . PERIPHERAL VASCULAR THROMBECTOMY N/A 11/29/2017   Procedure: PERIPHERAL VASCULAR THROMBECTOMY - THROMBOLYSIS;  Surgeon: Serafina Mitchell, MD;  Location: [facility];  Service: Cardiovascular;  Laterality: N/A;  LYSIS CATHETER PLACEMENT ONLY  . PERIPHERAL VASCULAR THROMBECTOMY N/A 11/30/2017   Procedure: PERIPHERAL VASCULAR THROMBECTOMY - Lysis Recheck;  Surgeon: Serafina Mitchell, MD;  Location: [facility];  Service: Cardiovascular;  Laterality: N/A;  . WISDOM TOOTH EXTRACTION     Social History   Social History Narrative   The patient is divorced.     Re-married Marcello Moores, partner of 10 years, on 10/10/16. 2 dogs, 1 cat   No children - doesn't want any   Curator for [employer].   Moved from [place] to [place] in 2013.   Past smoker   No alcohol   2-3 caffeinated beverages a day   She reports she is compliant with sunscreen given her increased risk of sun damage and skin cancer (no longer on azathioprine)      Updated 12/24/19   family history includes Alcohol abuse in her sister; Bone cancer in her mother; Breast  cancer (age of onset: 80) in her mother; Colon cancer in her paternal grandfather; Colon polyps in her father; Depression in her mother; Diabetes in her father; Fuch's dystrophy in her father and sister; Heart disease in her paternal grandmother; Hyperlipidemia in her father; Hypertension in her father, maternal grandmother, mother, paternal grandmother, and sister; Hypothyroidism in her mother; Lung cancer in her maternal grandmother; Multiple sclerosis in her sister; Stroke in her maternal grandmother.   Review of Systems Ambulates with a cane to reduce falls MS slowly progressive she says MRI f/u upcoming  Objective:   Physical Exam @BP  102/68   Pulse 80   Temp 97.9 F (36.6 C)   Ht 5' 4"  (1.626 m)   Wt 233 lb 6.4 oz (105.9 kg)   BMI 40.06 kg/m @  General:  NAD, obese pleasant white woman Eyes:   anicteric Lungs:  clear Heart::  S1S2 no rubs, murmurs or gallops Abdomen:  soft and nontender, BS+   Data Reviewed:  PCP notes labs February 21

## 2020-01-22 NOTE — Patient Instructions (Addendum)
Please come in early June to get lab work drawn. No appointment needed, the lab is open Monday -Friday  7:30AM-5:30PM and close for lunch from 1:00pm-1:30pm.   I appreciate the opportunity to care for you. Silvano Rusk, MD, [facility]

## 2020-01-22 NOTE — Assessment & Plan Note (Signed)
It sounds to me and her like she had a transient partial small bowel obstruction that has resolved.  Could have been adhesions could have been Crohn's.  She is better now and she prefers to observe and I think that is fine.  If this is recurrent then we need to think about additional lab work-up, imaging with hopefully what I would do would be an MR enterography versus colonoscopy.  Otherwise return in 6 months.  She is only using the colestipol as needed with travel so that is not the culprit.

## 2020-01-22 NOTE — Assessment & Plan Note (Signed)
No problems noted.  She plans to get the Covid vaccine in the future but wants to be somewhat distant from her ocrelizumab infusion.

## 2020-01-22 NOTE — Assessment & Plan Note (Signed)
Ferritin and hemoglobin acceptable in February.  Recheck in early June.  She had what sounds like an infusion reaction with the Venofer.  Hopefully this would mean she can get Feraheme or other less reactive agent in the future should she need it.  She has [place] healthcare which has become more restrictive on iron infusion agents.

## 2020-01-22 NOTE — Assessment & Plan Note (Signed)
Colestipol as needed travel

## 2020-01-25 ENCOUNTER — Other Ambulatory Visit: Payer: Self-pay | Admitting: Family Medicine

## 2020-01-25 DIAGNOSIS — E781 Pure hyperglyceridemia: Secondary | ICD-10-CM

## 2020-03-06 ENCOUNTER — Other Ambulatory Visit: Payer: Self-pay

## 2020-03-06 DIAGNOSIS — G35 Multiple sclerosis: Secondary | ICD-10-CM

## 2020-03-06 NOTE — Progress Notes (Signed)
Per Dr. Tomi Likens and Pt request Mri orders added. Note to front desk to rescheduled her f/u until after the test are preformed sent.

## 2020-03-10 ENCOUNTER — Ambulatory Visit: Payer: 59 | Admitting: Neurology

## 2020-03-19 ENCOUNTER — Other Ambulatory Visit: Payer: Self-pay | Admitting: Neurology

## 2020-03-19 DIAGNOSIS — G35 Multiple sclerosis: Secondary | ICD-10-CM

## 2020-04-05 ENCOUNTER — Ambulatory Visit
Admission: RE | Admit: 2020-04-05 | Discharge: 2020-04-05 | Disposition: A | Discharge status: Home / Self Care | Payer: 59 | Source: Ambulatory Visit | Attending: Neurology | Admitting: Neurology

## 2020-04-05 ENCOUNTER — Other Ambulatory Visit: Payer: Self-pay

## 2020-04-05 ENCOUNTER — Other Ambulatory Visit: Payer: 59

## 2020-04-05 DIAGNOSIS — G35 Multiple sclerosis: Secondary | ICD-10-CM

## 2020-04-05 MED ORDER — GADOBENATE DIMEGLUMINE 529 MG/ML IV SOLN
20.0000 mL | Freq: Once | INTRAVENOUS | Status: AC | PRN
  Administered 2020-04-05: 20 mL via INTRAVENOUS

## 2020-04-09 NOTE — Progress Notes (Addendum)
NEUROLOGY FOLLOW UP OFFICE NOTE  Lindsay Mccarthy [id number]  HISTORY OF PRESENT ILLNESS: Lindsay Mccarthy a 44 year old right-handed woman with Crohn's disease, depression, and anxiety who follows up for multiple sclerosis.  MRI cervical and thoracic spine personally reviewed.  She is accompanied by her husband who supplements history.  UPDATE: Current disease modifying therapy: Ocrevus (first dose on 12/15/18, followed by the second dose on 12/29/18.  Last infusion in early October).  Other medication: D3 5000 IU BID, B12 injections,Ampyra,Wellbutrin, Lexapro 31m, gabapentin 3081mat bedtime,+ Entyvio  12/18/2019 vitamin D level 29.4.  D3 increased to 5000 IU BID.  04/05/2020 MRI CERVICAL & THORACIC SPINE W WO:  Chronic patchy multifocal cord signal abnormality throughout cervical and thoracic cord, overall stable compared to 2019.  No active lesions.  Degenerative cervical spondylosis at C4-5 and C5-6 with mild to moderate spinal stenosis and moderate left C5 foraminal narrowing.  Swelling in ankles. Scheduled for MRI of brain in early July.  She reports increased leg swelling after Ocrevus infusions. She has good days and bad days, but overall she feels that she has progressed over the past year.   Vision:No issues Motor: No new issues Sensory: Increased numbness andtingling in legs and now sometimes in hands. Pain: Spasms in legs.. Gait: Worse since last visit.  She trips over herself and cannot stand very long. Bowel/Bladder: Occasional urinary incontinence when stressed.  Crohn's disease. Fatigue: Some fatigue. Cognition: Trouble multi-tasking.  She underwent neuropsychological testing on 09/04/2019.  Performance fell largely within normal limits.  She did exhibit a weakness across aspects of attention/concentration and some performance variability across processing speed.  Performance involving complex attention, executive functioning, receptive and expressive  language, visual-spatial abilities and verbal and visual learning and memory were within normal range. Mood: Stable  HISTORY: She reports history of various neurologic symptoms since her late 2080s- Urinary incontinence: It started about a year ago and has gotten worse.It occurs suddenly, once or twice a month, usually at home and while standing, such as while washing dishes with the water running. It occurs during fatigued oremotional stress as well. She denies increased urinary frequency or dysuria. No perineal numbness. No low back pain. - She reports poor night vision with difficulty seeing while driving at night.She has trouble focusing. Recent eye exam showed poor "refocusing", which would normally be seen in an older adult. Her sister had Fuchs dystrophy but she doesn't have it. - She reports numbness and tingling in her hands when she wakes up in the morning or in the evening after work. - She reports abnormal gait. It feels like her feet are stuck in cement. She has trouble lifting her feet. It causes hip pain. She trips over her feet. It is worse when there is an extreme change in temperature. She started having trouble in her mid-30s. Worse over the past 2 years. She reports a stabbing pain in her hips or knees. She reports previously being diagnosed with a pinched nerve in the back that caused a left foot dropin the 20s.She had PT and injections which was ineffective. No numbness in the legs.  - When she throws a ball, her hand doesn't release immediately. There is a delay until she is able to release the ball. She has similar problems with her left hand. No neck pain. - She reports fatigued. She needs to take a nap during the day. Prior sleep study was normal.  One of her twosistershas multiple sclerosis.  Other history: She has Crohn's  disease. Earlier this year, she developed a DVT in her right leg. She also was found to have B12 deficiency  and is being treated with injections.  01/09/18 LABS: B12 143, Sed rate 18. Hypercoagulable panel unremarkable, including lupus anticoagulant panel, dRVVT mix, beta-2-glycoprotein, cardiolipin antibodies, prothrombin gene mutation, factor V Leiden. NMO/AQP4+ antibody was negative.Her insurance would not cover anti-MOGantibody testing.  09/2018 LABS:  JC Virus antibody was positive with index of 3.26. Quantiferon-TB Gold Plus negative.  She tested negative for Hep B.    07/22/18: MRI Cervical &Thoracic spine w/wo contrast: Multiple T2/STIR hyperintense signal within the spinal cord at C2, C3-C4, C6-C7, T1, T3-T4, T6-T7, and T12 levels, all non-enhancing. She also demonstrates degenerative spinal stenosis at C4-C5 and C5-C6 with mild spinal cord mass effect. 07/24/18: MRI Brain w/wo contrast: Mild hyperintensein the periventricular and juxtacortical white matter withsignal running perpendicular from the lateral ventricle and involving the corpus callosum, non-enhancing.  PAST MEDICAL HISTORY: Past Medical History:  Diagnosis Date  . Anemia    related to Crohns flares  . Arm DVT (deep venous thromboembolism), acute, right ([place]) 11/29/2017  . Arthritis    knees, feet, hands, wrists, related to Crohns flare  . Asthma    Childhood  . B12 deficiency    monitored/treated by Dr. Carlean Purl  . Cervical dysplasia   . Crohn's disease of small intestine ([place]) 06/24/2012   Diagnosed 1999, in [place]. Ileitis only then. Treated with Imuran Remicade and prednisone.  Noncompliant with therapy. 2004 return to care and was treated with Remicade prednisone Pentasa Cipro and Flagyl. 2006 status post right hemicolectomy. Subsequently treated with azathioprine and Cimzia. 200 mg every other week.  azathioprine was added in 2010. Prometheus TP MT enzyme was negative.  . Depression   . DVT of upper extremity (deep vein thrombosis) ([place]) 11/28/2017  . Eczema    arms and behind knees, worse in winter    . Generalized anxiety disorder   . History of recurrent UTI (urinary tract infection)   . HPV (human papilloma virus) infection   . Hyperlipemia   . Hypertriglyceridemia 07/03/2012   10/2010 labs showed level of 753 with total cholesterol 180 and HDL 41' Labs 05/2013--TG 180   . Major depressive disorder in remission ([place]) 10/03/2013  . Multiple sclerosis ([place]) 2019   Officially diagnosed in the Fall  . Neuromuscular disorder ([place])   . Osteopenia 09/29/2011   T-1.6  . Papanicolaou smear 12/12   last abnormal 2011  . Pulmonary embolism ([place]) 11/30/2017  . Scaphoid fracture of wrist 2013   left  . Seasonal allergic rhinitis   . Shingles 09/2015   R hip  . Vitamin B12 deficiency 06/19/2012  . Vitamin D deficiency 12/16/2015   Vitamin D-OH level 12     MEDICATIONS: Current Outpatient Medications on File Prior to Visit  Medication Sig Dispense Refill  . AMPYRA 10 MG TB12 TAKE 1 TABLET BY MOUTH  EVERY 12 HOURS 60 tablet 6  . acetaminophen (TYLENOL) 500 MG tablet Take 1,000 mg by mouth every 6 (six) hours as needed for headache (pain). Reported on 12/15/2015    . apixaban (ELIQUIS) 5 MG TABS tablet TAKE 1 TABLET(5 MG) BY MOUTH TWICE DAILY 180 tablet 1  . baclofen (LIORESAL) 10 MG tablet Take 1 tablet (10 mg total) by mouth 3 (three) times daily as needed for muscle spasms. 90 each 5  . Biotin 10000 MCG TABS Take by mouth.    Marland Kitchen buPROPion (WELLBUTRIN SR) 100 MG 12 hr tablet  Take 1 tablet (100 mg total) by mouth 2 (two) times daily. 180 tablet 3  . cetirizine (ZYRTEC) 10 MG tablet Take 10 mg by mouth at bedtime.     . Cholecalciferol (VITAMIN D3) 2000 units TABS Take 3,000 Units by mouth every evening.     . colestipol (COLESTID) 5 g packet TAKE 5 GRAMS BY MOUTH DAILY 30 each 5  . cyanocobalamin (,VITAMIN B-12,) 1000 MCG/ML injection INJECT 1 ML IN THE MUSCLE WEEKLY FOR 4 WEEKS, THEN 1 ML MONTHLY THEREAFTER 10 mL 1  . diphenhydrAMINE (BENADRYL) 25 MG tablet Take 25 mg by mouth at bedtime as  needed for sleep.    Marland Kitchen escitalopram (LEXAPRO) 10 MG tablet TAKE 1 TABLET(10 MG) BY MOUTH AT BEDTIME 90 tablet 3  . fenofibrate 160 MG tablet TAKE 1 TABLET(160 MG) BY MOUTH AT BEDTIME 90 tablet 0  . gabapentin (NEURONTIN) 100 MG capsule TAKE 3 CAPSULES(300 MG) BY MOUTH AT BEDTIME 180 capsule 3  . levonorgestrel (MIRENA) 20 MCG/24HR IUD 1 each by Intrauterine route once. Implanted April 2017    . ocrelizumab 600 mg in sodium chloride 0.9 % 500 mL Inject 600 mg into the vein every 6 (six) months.    Marland Kitchen OVER THE COUNTER MEDICATION Apply 1 application topically daily as needed (arthritis pain). CBD Oil    . vedolizumab (ENTYVIO) 300 MG injection Inject 300 mg into the vein every 8 (eight) weeks. 1 vial 6   No current facility-administered medications on file prior to visit.    ALLERGIES: Allergies  Allergen Reactions  . Venofer [Iron Sucrose] Other (See Comments)    Fever, infusion reaction   . Ibuprofen Other (See Comments)    Avoids due to Crohns disease  . Morphine And Related Itching    itchy  . Prednisone Other (See Comments)    "crazy", hallucinations Tolerates methylprednisolone  . Adhesive [Tape] Rash    Rash with electrodes    FAMILY HISTORY: Family History  Problem Relation Age of Onset  . Hypertension Mother   . Breast cancer Mother 69  . Bone cancer Mother        metastatic from breast  . Depression Mother   . Hypothyroidism Mother   . Colon polyps Father   . Diabetes Father        borderline--resolved 2020  . Hypertension Father        off meds now  . Hyperlipidemia Father        off meds now  . Fuch's dystrophy Father   . Colon cancer Paternal Grandfather   . Multiple sclerosis Sister        affecting brain only  . Alcohol abuse Sister   . Fuch's dystrophy Sister   . Stroke Maternal Grandmother   . Lung cancer Maternal Grandmother   . Hypertension Maternal Grandmother   . Heart disease Paternal Grandmother   . Hypertension Paternal Grandmother   .  Hypertension Sister    SOCIAL HISTORY: Social History   Socioeconomic History  . Marital status: Married    Spouse name: Marcello Moores  . Number of children: 0  . Years of education: 102  . Highest education level: Associate degree: occupational, Hotel manager, or vocational program  Occupational History  . 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 Smoking status: Former Smoker    Packs/day: 1.00    Years: 4.00    Pack years: 4.00    Quit date: 11/18/1998    Years since quitting: 21.4  .  Smokeless tobacco: Never Used  Substance and Sexual Activity  . Alcohol use: No    Alcohol/week: 0.0 standard drinks  . Drug use: No  . Sexual activity: Yes    Partners: Male    Birth control/protection: I.U.D.    Comment: Mirena inserted 01/2016  Other Topics Concern  . Not on file  Social History Narrative   The patient is divorced.     Re-married Marcello Moores, partner of 10 years, on 10/10/16. 2 dogs, 1 cat   No children - doesn't want any   Curator for [employer].   Moved from [place] to [place] in 2013.   Past smoker   No alcohol   2-3 caffeinated beverages a day   She reports she is compliant with sunscreen given her increased risk of sun damage and skin cancer (no longer on azathioprine)      Updated 12/24/19   Social Determinants of Health   Financial Resource Strain:   . Difficulty of Paying Living Expenses:   Food Insecurity:   . Worried About Charity fundraiser in the Last Year:   . Arboriculturist in the Last Year:   Transportation Needs:   . Film/video editor (Medical):   Marland Kitchen Lack of Transportation (Non-Medical):   Physical Activity:   . Days of Exercise per Week:   . Minutes of Exercise per Session:   Stress:   . Feeling of Stress :   Social Connections:   . Frequency of Communication with Friends and Family:   . Frequency of Social Gatherings with Friends and Family:   . Attends Religious Services:   . Active  Member of Clubs or Organizations:   . Attends Archivist Meetings:   Marland Kitchen Marital Status:   Intimate Partner Violence:   . Fear of Current or Ex-Partner:   . Emotionally Abused:   Marland Kitchen Physically Abused:   . Sexually Abused:     PHYSICAL EXAM: Blood pressure 125/85, pulse 86, resp. rate 18, height 5' 4"  (1.626 m), weight 234 lb (106.1 kg), SpO2 96 %. General: No acute distress.  Patient appears well-groomed.   Head:  Normocephalic/atraumatic Eyes:  Fundi examined but not visualized Neck: supple, no paraspinal tenderness, full range of motion Heart:  Regular rate and rhythm Lungs:  Clear to auscultation bilaterally Back: No paraspinal tenderness Neurological Exam: alert and oriented to person, place, and time. Attention span and concentration intact, recent and remote memory intact, fund of knowledge intact.  Speech fluent and not dysarthric, language intact.  CN II-XII intact. Bulk and tone normal, muscle strength 5-/5 right hip flexion, otherwise 5/5 throughout.  Sensation to pinprick reduced in right upper and lower extremities and reduced vibratory sensation in right lower extremity.  Deep tendon reflexes 2+ throughout, bilateral Babinski.  Finger to nose testing intact.  Spastic gait. Timed 25 foot walk 12.52 seconds.  Romberg with mild sway.  IMPRESSION: Multiple sclerosis.  MRI of spinal cord shows no progression but she notes clinical progression of disease.  MRI of brain however is still pending.  I think given her clinical status and the fact that she is having some side effects to Ocrevus (peripheral edema), I would favor changing DMT.  PLAN: 1.  Plan is to switch DMT:  Provided her information on [place], Kesimpta and Zeposia.  07/09/2020 ADDENDUM:  Patient decided to continue Ocrevus. 2.  Continue D3 5000 IU BID, baclofen 90m TID PRN, gabapentin 3066mat bedtime. 3.  She will discontinue Ampyra  as it no longer seems effective.  ADDENDUM:  Patient returned home and realized  that she in fact had not been taking the Ampyra (unclear how long).  She will restart it and we will reassess gait again at next follow up appointment. 4.  Repeat vitamin D level in 6 months. 5.  Await MRI of brain. 6.  Follow up in 6 months.  Metta Clines, DO  CC: Rita Ohara, MD

## 2020-04-11 ENCOUNTER — Other Ambulatory Visit (INDEPENDENT_AMBULATORY_CARE_PROVIDER_SITE_OTHER): Payer: 59

## 2020-04-11 ENCOUNTER — Encounter: Payer: Self-pay | Admitting: Neurology

## 2020-04-11 ENCOUNTER — Other Ambulatory Visit: Payer: Self-pay

## 2020-04-11 ENCOUNTER — Ambulatory Visit (INDEPENDENT_AMBULATORY_CARE_PROVIDER_SITE_OTHER): Payer: 59 | Admitting: Neurology

## 2020-04-11 VITALS — BP 125/85 | HR 86 | Resp 18 | Ht 64.0 in | Wt 234.0 lb

## 2020-04-11 DIAGNOSIS — D500 Iron deficiency anemia secondary to blood loss (chronic): Secondary | ICD-10-CM

## 2020-04-11 DIAGNOSIS — G35 Multiple sclerosis: Secondary | ICD-10-CM

## 2020-04-11 DIAGNOSIS — K50012 Crohn's disease of small intestine with intestinal obstruction: Secondary | ICD-10-CM

## 2020-04-11 LAB — CBC WITH DIFFERENTIAL/PLATELET
Basophils Absolute: 0 10*3/uL (ref 0.0–0.1)
Basophils Relative: 0.4 % (ref 0.0–3.0)
Eosinophils Absolute: 0.1 10*3/uL (ref 0.0–0.7)
Eosinophils Relative: 1.1 % (ref 0.0–5.0)
HCT: 33.1 % — ABNORMAL LOW (ref 36.0–46.0)
Hemoglobin: 10.8 g/dL — ABNORMAL LOW (ref 12.0–15.0)
Lymphocytes Relative: 8.9 % — ABNORMAL LOW (ref 12.0–46.0)
Lymphs Abs: 0.9 10*3/uL (ref 0.7–4.0)
MCHC: 32.5 g/dL (ref 30.0–36.0)
MCV: 83.4 fl (ref 78.0–100.0)
Monocytes Absolute: 1 10*3/uL (ref 0.1–1.0)
Monocytes Relative: 10.4 % (ref 3.0–12.0)
Neutro Abs: 7.7 10*3/uL (ref 1.4–7.7)
Neutrophils Relative %: 79.2 % — ABNORMAL HIGH (ref 43.0–77.0)
Platelets: 409 10*3/uL — ABNORMAL HIGH (ref 150.0–400.0)
RBC: 3.97 Mil/uL (ref 3.87–5.11)
RDW: 14.5 % (ref 11.5–15.5)
WBC: 9.8 10*3/uL (ref 4.0–10.5)

## 2020-04-11 LAB — FERRITIN: Ferritin: 3.9 ng/mL — ABNORMAL LOW (ref 10.0–291.0)

## 2020-04-11 NOTE — Patient Instructions (Signed)
1.  Stop Ampyra 2.  Consider these MS drugs:  1.  MAVENCLAD (Cladribine tablets) What is this medicine? CLADRIBINE (KLA dri been) may help prevent relapses of multiple sclerosis. This medicine is not a cure. This medicine may be used for other purposes; ask your health care provider or pharmacist if you have questions. COMMON BRAND NAME(S): MAVENCLAD What should I tell my health care provider before I take this medicine? They need to know if you have any of these conditions:  cancer  HIV or AIDS  immune system problems  infection (especially a viral infection such as chickenpox, cold sores, or herpes)  kidney disease  liver disease  low blood counts, like white cells, platelets, or red blood cells  recently received or scheduled to receive a vaccine  an unusual or allergic reaction to cladribine, other medicines, foods, dyes, or preservatives  pregnant or trying to get pregnant  breast-feeding How should I use this medicine? Take this medicine by mouth with a glass of water. Follow the directions on the prescription label. You can take it with or without food. If it upsets your stomach, take it with food. Take your medicine at regular intervals. Do not take it more often than directed. Do not stop taking except on your doctor's advice. A special MedGuide will be given to you by the pharmacist with each prescription and refill. Be sure to read this information carefully each time. Talk to your pediatrician about the use of this medicine in children. Special care may be needed. Overdosage: If you think you have taken too much of this medicine contact a poison control center or emergency room at once. NOTE: This medicine is only for you. Do not share this medicine with others. What if I miss a dose? It is important not to miss any doses. Talk to your healthcare professional about what to do if you miss a dose. What may interact with this medicine? Do not take this medicine with  any of the following medications:  live virus vaccines This medicine may also interact with the following medications:  birth control pills  certain antivirals for HIV or hepatitis  cilostazol  curcumin  cyclosporine  dilazep  dipyridamole  eltrombopag  medicines that lower your chance of fighting infection  nifedipine  nimodipine  reserpine  ribavirin  rifampicin  St. John's Wort  steroid medicines like prednisone or cortisone  sulindac This list may not describe all possible interactions. Give your health care provider a list of all the medicines, herbs, non-prescription drugs, or dietary supplements you use. Also tell them if you smoke, drink alcohol, or use illegal drugs. Some items may interact with your medicine. What should I watch for while using this medicine? Visit your healthcare professional for regular checks on your progress. Tell your healthcare professional if your symptoms do not start to get better or if they get worse. You may need blood work done while you are taking this medicine. This medicine may increase your risk of getting an infection. Call your healthcare professional for advice if you get a fever, chills, or sore throat, or other symptoms of a cold or flu. Do not treat yourself. Try to avoid being around people who are sick. Call your healthcare professional if you are around anyone with measles, chickenpox, or if you develop blisters that do not heal properly. In some patients, this medicine may cause a serious brain infection that may cause death. If you have any problems seeing, thinking, speaking, walking, or  standing, tell your healthcare professional right away. If you cannot reach your healthcare professional, urgently seek other source of medical care. Talk with your doctor if you have not had chickenpox or the vaccine for chickenpox. This medicine can decrease the response to a vaccine. If you need to get vaccinated, tell your  healthcare professional if you have received this medicine within the last 4 weeks. Extra booster doses may be needed. Talk to your healthcare professional to see if a different vaccination schedule is needed. Talk to your healthcare professional about your risk of cancer. You may be more at risk for certain types of cancer if you take this medicine. Do not become pregnant while taking this medicine or for 6 months after stopping it. Women should inform their healthcare professional if they wish to become pregnant or think they might be pregnant. Men should not father a child while taking this medicine and for 6 months after stopping it. There is potential for serious side effects to an unborn child. Talk to your healthcare professional for more information. What side effects may I notice from receiving this medicine? Side effects that you should report to your doctor or health care professional as soon as possible:  allergic reactions like skin rash, itching or hives, swelling of the face, lips, or tongue  changes in vision  confusion  loss of balance or coordination  loss of memory  signs and symptoms of infection like fever or chills; cough; sore throat; pain or trouble passing urine  signs and symptoms of liver injury like dark yellow or brown urine; general ill feeling or flu-like symptoms; light-colored stools; loss of appetite; nausea; right upper belly pain; unusually weak or tired; yellowing of the eyes or skin Side effects that usually do not require medical attention (report to your doctor or health care professional if they continue or are bothersome):  back pain  hair loss  headache  joint pain  nausea  trouble sleeping This list may not describe all possible side effects. Call your doctor for medical advice about side effects. You may report side effects to FDA at 1-800-FDA-1088. Where should I keep my medicine? Keep out of the reach of children. Store between 20 and  25 degrees C (68 and 77 degrees F). Keep this medicine in the original container. Throw away any unused medicine after the expiration date. NOTE: This sheet is a summary. It may not cover all possible information. If you have questions about this medicine, talk to your doctor, pharmacist, or health care provider.  2020 Elsevier/Gold Standard (2018-02-07 01:52:02)  2.  ZEPOSIA (Ozanimod capsules) What is this medicine? OZANIMOD (oh ZAN i mod) helps prevent relapses of multiple sclerosis. The medicine does not cure multiple sclerosis. This medicine may be used for other purposes; ask your health care provider or pharmacist if you have questions. COMMON BRAND NAME(S): ZEPOSIA What should I tell my health care provider before I take this medicine? They need to know if you have any of these conditions:  diabetes  heart disease  high blood pressure  history of irregular heartbeat  history of stroke  immune system problems  infection (especially a virus infection such as chickenpox, cold sores, or herpes)  liver disease  low blood counts, like low white cell, platelet, or red cell counts  lung or breathing disease, like asthma or sleep apnea  skin cancer  recently received or scheduled to receive a vaccine  uveitis  an unusual or allergic reaction to ozanimod, other  medicines, foods, dyes, or preservatives  pregnant or trying to get pregnant  breast-feeding How should I use this medicine? Take this medicine by mouth with a glass of water. Follow the directions on the prescription label. Do not cut, crush or chew this medicine. Swallow the capsules whole. You can take it with or without food. If it upsets your stomach, take it with food. Take your medicine at regular intervals. Do not take it more often than directed. Do not stop taking except on your doctor's advice. A special MedGuide will be given to you by the pharmacist with each prescription and refill. Be sure to read  this information carefully each time. Talk to your pediatrician regarding the use of this medicine in children. Special care may be needed. Overdosage: If you think you have taken too much of this medicine contact a poison control center or emergency room at once. NOTE: This medicine is only for you. Do not share this medicine with others. What if I miss a dose? If you miss 1 or more doses during the first 14 days of treatment, talk to your healthcare provider. You will need to begin another starter pack. If you miss a dose after the first 14 days of treatment, skip it. Take your next dose at the normal time. Do not take extra or 2 doses at the same time to make up for the missed dose. What may interact with this medicine? Do not take this medicine with any of the following medications:  linezolid  MAOIs like Azilect, Carbex, Eldepryl, Marplan, Nardil, Parnate, and Xadago This medicine may also interact with the following medications:  alemtuzumab  certain medicines for blood pressure, heart disease, irregular heart beat  certain medicines for depression, anxiety  clopidogrel  cyclosporine  eltrombopag  gemfibrozil  live virus vaccines  medicines that lower your chance of fighting infection  other medicines that prolong the QT interval (abnormal heart rhythm)  narcotic medicines for pain  phenylephrine  pseudoephedrine  rifampin  stimulant medicines for attention disorders, weight loss, or staying awake  tryptophan  tyramine This list may not describe all possible interactions. Give your health care provider a list of all the medicines, herbs, non-prescription drugs, or dietary supplements you use. Also tell them if you smoke, drink alcohol, or use illegal drugs. Some items may interact with your medicine. What should I watch for while using this medicine? Visit your health care professional for regular checks on your progress. Tell your health care professional if  your symptoms do not start to get better or if they get worse. You may need blood work done while you are taking this medicine. Foods that contain very high amounts of tyramine, such as aged, fermented, cured, smoked and pickled foods, should be avoided while taking this medicine. The combination may cause a dangerous rise in blood pressure. Ask your doctor or health care professional, pharmacist, or nutritionist for a complete listing of foods and beverages that are high in tyramine. If you consume a food or beverage very rich in tyramine and do not feel well soon after eating, contact your health care provider. Tell your doctor or health care professional right away if you have any change in your eyesight. Talk with your doctor if you have not had chickenpox or the vaccine for chickenpox. This medicine can make you more sensitive to the sun. Keep out of the sun. If you cannot avoid being in the sun, wear protective clothing and sunscreen. Do not use sun  lamps or tanning beds/booths. Women should inform their doctor if they wish to become pregnant or think they might be pregnant. There is a potential for serious side effects to an unborn child. If you are a female who can become pregnant, you should use effective birth control during your treatment with this medicine and for at least 3 months after you stop taking it. Talk to your health care professional or pharmacist for more information. If you stop taking this medicine, your MS symptoms may get worse. You may have more weakness, trouble using your arms or legs, or changes in balance. Talk to your healthcare provider right away if your symptoms get worse. What side effects may I notice from receiving this medicine? Side effects that you should report to your doctor or health care professional as soon as possible:  allergic reactions like skin rash, itching or hives, swelling of the face, lips, or tongue  breathing problems  changes in  vision  confusion  headache with fever, neck stiffness, sensitivity to light, nausea, or confusion  high blood pressure  loss of memory  problems with balance, talking, or walking  signs and symptoms of a dangerous change in heartbeat or heart rhythm like chest pain; dizziness; fast or irregular heartbeat; palpitations; feeling faint or lightheaded, falls; breathing problems  signs and symptoms of infection like fever or chills; cough; sore throat; pain or trouble passing urine  signs and symptoms of liver injury like dark yellow or brown urine; general ill feeling or flu-like symptoms; light-colored stools; loss of appetite; nausea; right upper belly pain; unusually weak or tired; yellowing of the eyes or skin  change in your skin's appearance (color, change in a mole or freckle, new growth)  seizures  sudden numbness or weakness of the face, arm or leg  unusually slow heartbeat Side effects that usually do not require medical attention (report to your doctor or health care professional if they continue or are bothersome):  back pain  dizziness  sinus trouble This list may not describe all possible side effects. Call your doctor for medical advice about side effects. You may report side effects to FDA at 1-800-FDA-1088. Where should I keep my medicine? Keep out of the reach of children. Store at room temperature between 20 and 25 degrees C (68 and 77 degrees F). Throw away any unused medicine after the expiration date. NOTE: This sheet is a summary. It may not cover all possible information. If you have questions about this medicine, talk to your doctor, pharmacist, or health care provider.  2020 Elsevier/Gold Standard (2019-04-03 14:27:29)   3.  KESIMPTA (Ofatumumab injection) What is this medicine? OFATUMUMAB (O fa TOOM ue mab) is a monoclonal antibody. It is used to treat chronic lymphocytic leukemia (CLL). This medicine is also used to treat multiple sclerosis. This  medicine may be used for other purposes; ask your health care provider or pharmacist if you have questions. COMMON BRAND NAME(S): Nelwyn [place], Kesimpta What should I tell my health care provider before I take this medicine? They need to know if you have any of these conditions:  heart disease  infection (especially a virus infection such as hepatitis B, chickenpox, cold sores, or herpes)  immune system problems  irregular heartbeat  low blood counts, like low white cell, platelet, or red cell counts  lung or breathing disease, like asthma  recently received or scheduled to receive a vaccine  an unusual or allergic reaction to ofatumumab, other medicines, foods, dyes, or preservatives  pregnant or trying to get pregnant  breast-feeding How should I use this medicine? This medicine is for infusion into a vein or for injection under the skin. Infusions are given by a health care professional in a hospital or clinic setting. If you are to give your own medicine at home, you will be taught how to prepare and give this medicine under the skin. Use exactly as directed. Take your medicine at regular intervals. Do not take your medicine more often than directed. It is important that you put your used needles and syringes in a special sharps container. Do not put them in a trash can. If you do not have a sharps container, call your pharmacist or health care provider to get one. A special MedGuide will be given to you by the pharmacist with each prescription and refill. Be sure to read this information carefully each time. Talk to your pediatrician regarding the use of this medicine in children. Special care may be needed. Overdosage: If you think you have taken too much of this medicine contact a poison control center or emergency room at once. NOTE: This medicine is only for you. Do not share this medicine with others. What if I miss a dose? If you are to be given an infusion of this medicine, it  is important not to miss your dose. Call your doctor or health care professional if you are unable to keep an appointment. If you are given this medicine by injection under the skin, it is important not to miss any doses. Talk to your health care professional about what to do if you miss a dose. What may interact with this medicine?  live virus vaccines  steroid medicines like prednisone or cortisone This list may not describe all possible interactions. Give your health care provider a list of all the medicines, herbs, non-prescription drugs, or dietary supplements you use. Also tell them if you smoke, drink alcohol, or use illegal drugs. Some items may interact with your medicine. What should I watch for while using this medicine? Your condition will be monitored carefully while you are receiving this medicine. You may need blood work done while you are taking this medicine. This medicine can cause serious allergic reactions. To reduce your risk you may need to take medicine before treatment with this medicine. Take your medicine as directed. In some patients, this medicine may cause a serious brain infection that may cause death. If you have any problems seeing, thinking, speaking, walking, or standing, tell your healthcare professional right away. If you cannot reach your healthcare professional, urgently seek other source of medical care. Call your doctor or health care professional for advice if you get a fever, chills or sore throat, or other symptoms of a cold or flu. Do not treat yourself. This drug decreases your body's ability to fight infections. Try to avoid being around people who are sick. This medicine may increase your risk to bruise or bleed. Call your doctor or health care professional if you notice any unusual bleeding. Do not become pregnant while taking this medicine or for 6 after stopping it. Women should inform their health care professional if they wish to become pregnant or  think they might be pregnant. There is potential for serious side effects to an unborn child. Talk to your health care professional for more information. What side effects may I notice from receiving this medicine? Side effects that you should report to your doctor or health care professional as soon as  possible:  allergic reactions like skin rash, itching or hives; swelling of the face, lips, or tongue  breathing problems  chest pain  changes in vision  confusion  diarrhea  fast, irregular heartbeat  low blood counts - this medicine may decrease the number of white blood cells, red blood cells and platelets. You may be at increased risk for infections and bleeding.  problems with balance, talking, or walking  mouth sores  redness, blistering, peeling, or loosening of the skin, including inside the mouth  signs of infection - fever or chills, cough, sore throat, pain or difficulty passing urine  signs and symptoms of kidney injury like trouble passing urine or change in the amount of urine  signs and symptoms of liver injury like dark yellow or brown urine; general ill feeling or flu-like symptoms; light-colored stools; loss of appetite; nausea; right upper belly pain; unusually weak or tired; yellowing of the eyes or skin  signs and symptoms of low blood pressure like dizziness; feeling faint or lightheaded, falls; unusually weak or tired  stomach pain  unusual bleeding or bruising  vomiting Side effects that usually do not require medical attention (report to your doctor or health care professional if they continue or are bothersome):  headache  joint pain  muscle pain  swelling of the ankles, feet, hands This list may not describe all possible side effects. Call your doctor for medical advice about side effects. You may report side effects to FDA at 1-800-FDA-1088. Where should I keep my medicine? Infusions will be given in a hospital or clinic and will not be  stored at home. Storage for syringes and autoinjector pens stored at home: Keep out of the reach of children. You will be instructed on how to store this medicine. Throw away any unused medicine after the expiration date on the label. NOTE: This sheet is a summary. It may not cover all possible information. If you have questions about this medicine, talk to your doctor, pharmacist, or health care provider.  2020 Elsevier/Gold Standard (2019-07-03 13:35:23)

## 2020-04-14 ENCOUNTER — Other Ambulatory Visit: Payer: Self-pay

## 2020-04-14 DIAGNOSIS — K50012 Crohn's disease of small intestine with intestinal obstruction: Secondary | ICD-10-CM

## 2020-04-14 DIAGNOSIS — D509 Iron deficiency anemia, unspecified: Secondary | ICD-10-CM

## 2020-04-18 ENCOUNTER — Telehealth: Payer: Self-pay | Admitting: Hematology

## 2020-04-18 NOTE — Telephone Encounter (Signed)
Scheduled per 6/18 sch message. Unable to reach pt. Left voicemail- appt add on 7/7.

## 2020-04-23 ENCOUNTER — Other Ambulatory Visit: Payer: Self-pay | Admitting: Neurology

## 2020-04-29 NOTE — Progress Notes (Signed)
44 y.o. G60P0000 Married Caucasian female here for annual exam.    Had spotting last week for one afternoon.  Felt moody and had skin break out.   Patient states her buttocks feel "bruised" and would like doctor to check it. Hx Crohn's.  No drainage.  No trauma.  On Eliquis for hx DVT/PE.  Sees Dr. Tomi Likens for her MS.   Still working form home.    PCP: Rita Ohara, MD    Neurology:  Dr. Tomi Likens.   No LMP recorded. (Menstrual status: IUD).     Period Cycle (Days):  (no cycle with IUD)     Sexually active: Yes.    The current method of family planning is IUD--Mirena 02-12-16.    Exercising: No.  The patient does not participate in regular exercise at present. Smoker:  Former  Health Maintenance: Pap: 01-09-18 Neg:Neg HR HPV, 01-13-16 Neg:Neg HR HPV History of abnormal Pap yes, Hx of LEEP 10-12 years ago. MMG: 12-20-18 3D/Neg/density B/BiRads1.  She will schedule.  Colonoscopy: 06-22-17 Crohn's- Dr. Carlean Purl BMD: 3-5 years Result :Osteopenia of hip TDaP:  12-15-15 Gardasil:   no HIV: Neg in the past Hep C: Neg in the past Screening Labs:  PCP.    reports that she quit smoking about 21 years ago. She has a 4.00 pack-year smoking history. She has never used smokeless tobacco. She reports that she does not drink alcohol and does not use drugs.  Past Medical History:  Diagnosis Date   Anemia    related to Crohns flares   Arm DVT (deep venous thromboembolism), acute, right ([place]) 11/29/2017   Arthritis    knees, feet, hands, wrists, related to Crohns flare   Asthma    Childhood   B12 deficiency    monitored/treated by Dr. Carlean Purl   Cervical dysplasia    Crohn's disease of small intestine ([place]) 06/24/2012   Diagnosed 1999, in [place]. Ileitis only then. Treated with Imuran Remicade and prednisone.  Noncompliant with therapy. 2004 return to care and was treated with Remicade prednisone Pentasa Cipro and Flagyl. 2006 status post right hemicolectomy. Subsequently treated with  azathioprine and Cimzia. 200 mg every other week.  azathioprine was added in 2010. Prometheus TP MT enzyme was negative.   Depression    DVT of upper extremity (deep vein thrombosis) ([place]) 11/28/2017   Eczema    arms and behind knees, worse in winter   Generalized anxiety disorder    History of recurrent UTI (urinary tract infection)    HPV (human papilloma virus) infection    Hyperlipemia    Hypertriglyceridemia 07/03/2012   10/2010 labs showed level of 753 with total cholesterol 180 and HDL 41' Labs 05/2013--TG 180    Major depressive disorder in remission ([place]) 10/03/2013   Multiple sclerosis ([place]) 2019   Officially diagnosed in the Fall   Neuromuscular disorder ([place])    Osteopenia 09/29/2011   T-1.6   Papanicolaou smear 12/12   last abnormal 2011   Pulmonary embolism ([place]) 11/30/2017   Scaphoid fracture of wrist 2013   left   Seasonal allergic rhinitis    Shingles 09/2015   R hip   Vitamin B12 deficiency 06/19/2012   Vitamin D deficiency 12/16/2015   Vitamin D-OH level 12     Past Surgical History:  Procedure Laterality Date   APPENDECTOMY     CERVICAL BIOPSY  W/ LOOP ELECTRODE EXCISION  2009   ---paps normal since   COLONOSCOPY  multiple   scanned   ESOPHAGOGASTRODUODENOSCOPY  multiple  scanned   HEMICOLECTOMY  2006   IR ANGIOGRAM PULMONARY BILATERAL SELECTIVE  12/03/2017   IR ANGIOGRAM SELECTIVE EACH ADDITIONAL VESSEL  12/03/2017   IR ANGIOGRAM SELECTIVE EACH ADDITIONAL VESSEL  12/03/2017   IR INFUSION THROMBOL ARTERIAL INITIAL (MS)  12/03/2017   IR INFUSION THROMBOL ARTERIAL INITIAL (MS)  12/03/2017   IR IVC FILTER PLMT / S&I /IMG GUID/MOD SED  12/04/2017   IR THROMB F/U EVAL ART/VEN FINAL DAY (MS)  12/04/2017   IR US GUIDE VASC ACCESS RIGHT  12/03/2017   IVC FILTER REMOVAL N/A 07/11/2018   Procedure: IVC FILTER REMOVAL;  Surgeon: Serafina Mitchell, MD;  Location: [facility];  Service: Cardiovascular;  Laterality: N/A;   IVC VENOGRAPHY N/A  07/11/2018   Procedure: IVC Venography;  Surgeon: Serafina Mitchell, MD;  Location: [facility];  Service: Cardiovascular;  Laterality: N/A;   LEEP  2011   --done in [place] Right 11/30/2017   Procedure: PERIPHERAL VASCULAR BALLOON ANGIOPLASTY;  Surgeon: Serafina Mitchell, MD;  Location: [facility];  Service: Cardiovascular;  Laterality: Right;   PERIPHERAL VASCULAR THROMBECTOMY N/A 11/29/2017   Procedure: PERIPHERAL VASCULAR THROMBECTOMY - THROMBOLYSIS;  Surgeon: Serafina Mitchell, MD;  Location: [facility];  Service: Cardiovascular;  Laterality: N/A;  LYSIS CATHETER PLACEMENT ONLY   PERIPHERAL VASCULAR THROMBECTOMY N/A 11/30/2017   Procedure: PERIPHERAL VASCULAR THROMBECTOMY - Lysis Recheck;  Surgeon: Serafina Mitchell, MD;  Location: [facility];  Service: Cardiovascular;  Laterality: N/A;   WISDOM TOOTH EXTRACTION      Current Outpatient Medications  Medication Sig Dispense Refill   acetaminophen (TYLENOL) 500 MG tablet Take 1,000 mg by mouth every 6 (six) hours as needed for headache (pain). Reported on 12/15/2015     apixaban (ELIQUIS) 5 MG TABS tablet TAKE 1 TABLET(5 MG) BY MOUTH TWICE DAILY 180 tablet 1   baclofen (LIORESAL) 10 MG tablet TAKE 1 TABLET(10 MG) BY MOUTH THREE TIMES DAILY AS NEEDED FOR MUSCLE SPASMS 90 tablet 1   Biotin 10000 MCG TABS Take by mouth.     buPROPion (WELLBUTRIN SR) 100 MG 12 hr tablet Take 1 tablet (100 mg total) by mouth 2 (two) times daily. 180 tablet 3   cetirizine (ZYRTEC) 10 MG tablet Take 10 mg by mouth at bedtime.      Cholecalciferol (VITAMIN D3) 2000 units TABS Take 3,000 Units by mouth every evening.      colestipol (COLESTID) 5 g packet TAKE 5 GRAMS BY MOUTH DAILY 30 each 5   cyanocobalamin (,VITAMIN B-12,) 1000 MCG/ML injection INJECT 1 ML IN THE MUSCLE WEEKLY FOR 4 WEEKS, THEN 1 ML MONTHLY THEREAFTER 10 mL 1   dalfampridine 10 MG TB12 Take by mouth.      diphenhydrAMINE (BENADRYL) 25 MG tablet Take 25 mg by mouth at bedtime as needed for sleep.     escitalopram (LEXAPRO) 10 MG tablet TAKE 1 TABLET(10 MG) BY MOUTH AT BEDTIME 90 tablet 3   fenofibrate 160 MG tablet TAKE 1 TABLET(160 MG) BY MOUTH AT BEDTIME 90 tablet 0   gabapentin (NEURONTIN) 100 MG capsule TAKE 3 CAPSULES(300 MG) BY MOUTH AT BEDTIME 180 capsule 3   levonorgestrel (MIRENA) 20 MCG/24HR IUD 1 each by Intrauterine route once. Implanted April 2017     ocrelizumab 600 mg in sodium chloride 0.9 % 500 mL Inject 600 mg into the vein every 6 (six) months.     OVER THE COUNTER MEDICATION Apply  1 application topically daily as needed (arthritis pain). CBD Oil     vedolizumab (ENTYVIO) 300 MG injection Inject 300 mg into the vein every 8 (eight) weeks. 1 vial 6   No current facility-administered medications for this visit.    Family History  Problem Relation Age of Onset   Hypertension Mother    Breast cancer Mother 43   Bone cancer Mother        metastatic from breast   Depression Mother    Hypothyroidism Mother    Colon polyps Father    Diabetes Father        borderline--resolved 2020   Hypertension Father        off meds now   Hyperlipidemia Father        off meds now   Fuch's dystrophy Father    Colon cancer Paternal Grandfather    Multiple sclerosis Sister        affecting brain only   Alcohol abuse Sister    Fuch's dystrophy Sister    Stroke Maternal Grandmother    Lung cancer Maternal Grandmother    Hypertension Maternal Grandmother    Heart disease Paternal Grandmother    Hypertension Paternal Grandmother    Hypertension Sister     Review of Systems  All other systems reviewed and are negative.   Exam:   BP 116/76 (Cuff Size: Large)    Pulse 76    Resp 16    Ht 5' 4"  (1.626 m)    Wt 235 lb 12.8 oz (107 kg)    BMI 40.47 kg/m     General appearance: alert, cooperative and appears stated age Head: normocephalic, without obvious  abnormality, atraumatic Neck: no adenopathy, supple, symmetrical, trachea midline and thyroid normal to inspection and palpation Lungs: clear to auscultation bilaterally Breasts: normal appearance, no masses or tenderness, No nipple retraction or dimpling, No nipple discharge or bleeding, No axillary adenopathy Heart: regular rate and rhythm Abdomen: soft, non-tender; right mid abdominal firmness (states this is area of scar tissue from Corhn's), no organomegaly Extremities: extremities normal, atraumatic, no cyanosis or edema Skin: skin color, texture, turgor normal. No rashes or lesions Lymph nodes: cervical, supraclavicular, and axillary nodes normal. Neurologic: grossly normal  Pelvic: External genitalia:  no lesions              No abnormal inguinal nodes palpated.              Urethra:  normal appearing urethra with no masses, tenderness or lesions              Bartholins and Skenes: normal                 Vagina: normal appearing vagina with normal color and discharge, no lesions              Cervix: no lesions.  IUD strings noted.               Pap taken: No. Bimanual Exam:  Uterus:  normal size, contour, position, consistency, mobility, non-tender              Adnexa: no mass, fullness, tenderness              Rectal exam: No..  Confirms.              Anus:  normal sphincter tone, no lesions  Chaperone was present for exam.  Assessment:   Well woman visit with normal exam. Mirena IUD. Hx LEEP. Hx osteopenia.  FHmetastaticbreast cancer. Mother.  Crohn's.On immunosuppressive medication.  Abdominal firmness in right midquadrant.  Hx DVT/PE.  On Eliquis.  IVC filter removed. MS. Limited pelvic exam.  Plan: Mammogram screening discussed. Self breast awareness reviewed. Pap and HR HPV as above. New pap guidelines reviewed.  Guidelines for Calcium, Vitamin D, regular exercise program including cardiovascular and weight bearing exercise. Pelvic US recommended .  Has  plan for Covid vaccine.  Follow up annually and prn.   After visit summary provided.

## 2020-04-30 ENCOUNTER — Ambulatory Visit (INDEPENDENT_AMBULATORY_CARE_PROVIDER_SITE_OTHER): Payer: 59 | Admitting: Obstetrics and Gynecology

## 2020-04-30 ENCOUNTER — Other Ambulatory Visit: Payer: Self-pay

## 2020-04-30 ENCOUNTER — Encounter: Payer: Self-pay | Admitting: Obstetrics and Gynecology

## 2020-04-30 VITALS — BP 116/76 | HR 76 | Resp 16 | Ht 64.0 in | Wt 235.8 lb

## 2020-04-30 DIAGNOSIS — Z6841 Body Mass Index (BMI) 40.0 and over, adult: Secondary | ICD-10-CM | POA: Diagnosis not present

## 2020-04-30 DIAGNOSIS — Z01419 Encounter for gynecological examination (general) (routine) without abnormal findings: Secondary | ICD-10-CM

## 2020-04-30 DIAGNOSIS — Z30431 Encounter for routine checking of intrauterine contraceptive device: Secondary | ICD-10-CM | POA: Diagnosis not present

## 2020-04-30 NOTE — Patient Instructions (Signed)

## 2020-05-01 ENCOUNTER — Other Ambulatory Visit: Payer: Self-pay | Admitting: Family Medicine

## 2020-05-01 ENCOUNTER — Telehealth: Payer: Self-pay | Admitting: Obstetrics and Gynecology

## 2020-05-01 DIAGNOSIS — E781 Pure hyperglyceridemia: Secondary | ICD-10-CM

## 2020-05-01 DIAGNOSIS — Z86711 Personal history of pulmonary embolism: Secondary | ICD-10-CM

## 2020-05-01 DIAGNOSIS — Z7901 Long term (current) use of anticoagulants: Secondary | ICD-10-CM

## 2020-05-01 NOTE — Telephone Encounter (Signed)
Call to patient. Per DPR, OK to leave message on voicemail.   Left voicemail requesting a return call to [facility] to review benefits and schedule recommended Pelvic ultrasound with [facility] A. Quincy Simmonds, MD, Cherlynn June

## 2020-05-01 NOTE — Telephone Encounter (Signed)
Spoke with patient regarding benefits for recommended ultrasound. Patient is aware that ultrasound is transvaginal. Patient acknowledges understanding of information presented. Patient is aware of cancellation policy. Patient scheduled appointment for 05/15/2020 at 1000AM with [place]. Quincy Simmonds, MD, Cherlynn June. Encounter closed.

## 2020-05-02 ENCOUNTER — Ambulatory Visit
Admission: RE | Admit: 2020-05-02 | Discharge: 2020-05-02 | Disposition: A | Discharge status: Home / Self Care | Payer: 59 | Source: Ambulatory Visit | Attending: Neurology | Admitting: Neurology

## 2020-05-02 DIAGNOSIS — G35 Multiple sclerosis: Secondary | ICD-10-CM

## 2020-05-02 MED ORDER — GADOBENATE DIMEGLUMINE 529 MG/ML IV SOLN
20.0000 mL | Freq: Once | INTRAVENOUS | Status: AC | PRN
  Administered 2020-05-02: 20 mL via INTRAVENOUS

## 2020-05-06 ENCOUNTER — Telehealth: Payer: Self-pay

## 2020-05-06 NOTE — Telephone Encounter (Signed)
-----   Message from Pieter Partridge, DO sent at 05/06/2020  7:09 AM EDT ----- MRI findings in the brain are stable.

## 2020-05-06 NOTE — Telephone Encounter (Signed)
LMOVM, Please give the office a call back in regards to MRI results

## 2020-05-07 ENCOUNTER — Other Ambulatory Visit: Payer: Self-pay

## 2020-05-07 ENCOUNTER — Inpatient Hospital Stay: Payer: 59 | Attending: Hematology | Admitting: Hematology

## 2020-05-07 ENCOUNTER — Inpatient Hospital Stay: Payer: 59

## 2020-05-07 VITALS — BP 127/80 | HR 80 | Temp 97.9°F | Resp 17 | Ht 64.0 in | Wt 234.8 lb

## 2020-05-07 DIAGNOSIS — Z833 Family history of diabetes mellitus: Secondary | ICD-10-CM | POA: Insufficient documentation

## 2020-05-07 DIAGNOSIS — Z79899 Other long term (current) drug therapy: Secondary | ICD-10-CM | POA: Insufficient documentation

## 2020-05-07 DIAGNOSIS — Z8371 Family history of colonic polyps: Secondary | ICD-10-CM | POA: Diagnosis not present

## 2020-05-07 DIAGNOSIS — D509 Iron deficiency anemia, unspecified: Secondary | ICD-10-CM

## 2020-05-07 DIAGNOSIS — Z886 Allergy status to analgesic agent status: Secondary | ICD-10-CM | POA: Diagnosis not present

## 2020-05-07 DIAGNOSIS — Z87440 Personal history of urinary (tract) infections: Secondary | ICD-10-CM | POA: Insufficient documentation

## 2020-05-07 DIAGNOSIS — Z800 Family history of malignant neoplasm of digestive organs: Secondary | ICD-10-CM | POA: Insufficient documentation

## 2020-05-07 DIAGNOSIS — Z86711 Personal history of pulmonary embolism: Secondary | ICD-10-CM | POA: Insufficient documentation

## 2020-05-07 DIAGNOSIS — R0781 Pleurodynia: Secondary | ICD-10-CM | POA: Diagnosis not present

## 2020-05-07 DIAGNOSIS — Z8249 Family history of ischemic heart disease and other diseases of the circulatory system: Secondary | ICD-10-CM | POA: Diagnosis not present

## 2020-05-07 DIAGNOSIS — Z888 Allergy status to other drugs, medicaments and biological substances status: Secondary | ICD-10-CM | POA: Diagnosis not present

## 2020-05-07 DIAGNOSIS — E538 Deficiency of other specified B group vitamins: Secondary | ICD-10-CM

## 2020-05-07 DIAGNOSIS — Z808 Family history of malignant neoplasm of other organs or systems: Secondary | ICD-10-CM | POA: Insufficient documentation

## 2020-05-07 DIAGNOSIS — Z9049 Acquired absence of other specified parts of digestive tract: Secondary | ICD-10-CM | POA: Insufficient documentation

## 2020-05-07 DIAGNOSIS — Z6841 Body Mass Index (BMI) 40.0 and over, adult: Secondary | ICD-10-CM | POA: Diagnosis not present

## 2020-05-07 DIAGNOSIS — Z801 Family history of malignant neoplasm of trachea, bronchus and lung: Secondary | ICD-10-CM | POA: Insufficient documentation

## 2020-05-07 DIAGNOSIS — G35 Multiple sclerosis: Secondary | ICD-10-CM | POA: Diagnosis not present

## 2020-05-07 DIAGNOSIS — Z87891 Personal history of nicotine dependence: Secondary | ICD-10-CM | POA: Diagnosis not present

## 2020-05-07 DIAGNOSIS — Z7901 Long term (current) use of anticoagulants: Secondary | ICD-10-CM | POA: Diagnosis not present

## 2020-05-07 DIAGNOSIS — Z803 Family history of malignant neoplasm of breast: Secondary | ICD-10-CM | POA: Insufficient documentation

## 2020-05-07 DIAGNOSIS — Z86718 Personal history of other venous thrombosis and embolism: Secondary | ICD-10-CM | POA: Diagnosis not present

## 2020-05-07 DIAGNOSIS — Z83438 Family history of other disorder of lipoprotein metabolism and other lipidemia: Secondary | ICD-10-CM | POA: Insufficient documentation

## 2020-05-07 DIAGNOSIS — Z793 Long term (current) use of hormonal contraceptives: Secondary | ICD-10-CM | POA: Diagnosis not present

## 2020-05-07 DIAGNOSIS — R509 Fever, unspecified: Secondary | ICD-10-CM | POA: Diagnosis not present

## 2020-05-07 DIAGNOSIS — Z885 Allergy status to narcotic agent status: Secondary | ICD-10-CM | POA: Insufficient documentation

## 2020-05-07 DIAGNOSIS — Z823 Family history of stroke: Secondary | ICD-10-CM | POA: Insufficient documentation

## 2020-05-07 DIAGNOSIS — Z818 Family history of other mental and behavioral disorders: Secondary | ICD-10-CM | POA: Insufficient documentation

## 2020-05-07 DIAGNOSIS — Z811 Family history of alcohol abuse and dependence: Secondary | ICD-10-CM | POA: Insufficient documentation

## 2020-05-07 LAB — CMP (CANCER CENTER ONLY)
ALT: 19 U/L (ref 0–44)
AST: 16 U/L (ref 15–41)
Albumin: 3.3 g/dL — ABNORMAL LOW (ref 3.5–5.0)
Alkaline Phosphatase: 58 U/L (ref 38–126)
Anion gap: 11 (ref 5–15)
BUN: 9 mg/dL (ref 6–20)
CO2: 23 mmol/L (ref 22–32)
Calcium: 8.7 mg/dL — ABNORMAL LOW (ref 8.9–10.3)
Chloride: 107 mmol/L (ref 98–111)
Creatinine: 0.83 mg/dL (ref 0.44–1.00)
GFR, Est AFR Am: >60 mL/min (ref >60)
GFR, Estimated: >60 mL/min (ref >60)
Glucose, Bld: 82 mg/dL (ref 70–99)
Potassium: 4 mmol/L (ref 3.5–5.1)
Sodium: 141 mmol/L (ref 135–145)
Total Bilirubin: <0.2 mg/dL — ABNORMAL LOW (ref 0.3–1.2)
Total Protein: 6.9 g/dL (ref 6.5–8.1)

## 2020-05-07 LAB — CBC WITH DIFFERENTIAL/PLATELET
Abs Immature Granulocytes: 0.02 10*3/uL (ref 0.00–0.07)
Basophils Absolute: 0 10*3/uL (ref 0.0–0.1)
Basophils Relative: 0 %
Eosinophils Absolute: 0.1 10*3/uL (ref 0.0–0.5)
Eosinophils Relative: 1 %
HCT: 33.3 % — ABNORMAL LOW (ref 36.0–46.0)
Hemoglobin: 10.3 g/dL — ABNORMAL LOW (ref 12.0–15.0)
Immature Granulocytes: 0 %
Lymphocytes Relative: 12 %
Lymphs Abs: 1 10*3/uL (ref 0.7–4.0)
MCH: 26 pg (ref 26.0–34.0)
MCHC: 30.9 g/dL (ref 30.0–36.0)
MCV: 84.1 fL (ref 80.0–100.0)
Monocytes Absolute: 0.8 10*3/uL (ref 0.1–1.0)
Monocytes Relative: 9 %
Neutro Abs: 6.5 10*3/uL (ref 1.7–7.7)
Neutrophils Relative %: 78 %
Platelets: 421 10*3/uL — ABNORMAL HIGH (ref 150–400)
RBC: 3.96 MIL/uL (ref 3.87–5.11)
RDW: 14 % (ref 11.5–15.5)
WBC: 8.5 10*3/uL (ref 4.0–10.5)
nRBC: 0 % (ref 0.0–0.2)

## 2020-05-07 LAB — IRON AND TIBC
Iron: 31 ug/dL — ABNORMAL LOW (ref 41–142)
Saturation Ratios: 5 % — ABNORMAL LOW (ref 21–57)
TIBC: 621 ug/dL — ABNORMAL HIGH (ref 236–444)
UIBC: 591 ug/dL — ABNORMAL HIGH (ref 120–384)

## 2020-05-07 LAB — VITAMIN B12: Vitamin B-12: 175 pg/mL — ABNORMAL LOW (ref 180–914)

## 2020-05-07 LAB — FERRITIN: Ferritin: <4 ng/mL — ABNORMAL LOW (ref 11–307)

## 2020-05-07 NOTE — Progress Notes (Signed)
HEMATOLOGY/ONCOLOGY CONSULTATION NOTE  Date of Service: 05/07/2020  Patient Care Team: Rita Ohara, MD as PCP - General (Family Medicine) Pieter Partridge, DO as Consulting Physician (Neurology) Yisroel Ramming, Everardo All, MD as Consulting Physician (Obstetrics and Gynecology)  CHIEF COMPLAINTS/PURPOSE OF CONSULTATION:  F/u for Acute DVT and PE  HISTORY OF PRESENTING ILLNESS:    Lindsay Mccarthy is a wonderful 44 y.o. female who has been referred to [place] by Dr. Tomi Bamberger, her PCP, for evaluation and management of DVT and PE.  Patient reported a significant h/o Crohns disease and noted that she noticed on 11/26/17 that her b/l thighs were heavy swelling and painful. She initially did not have SOB. She presented to the hospital on 11/27/2017 with a sudden onset of right leg swelling for 2 days. She is also been having some pleuritic chest pain and shortness of breath upon exertion. 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 The following day she returned and had AngioJet thrombectomy and balloon venoplasty. She had residual thrombus within her inferior vena cava. Following her procedure, the pain in her right leg had almost completely resolved. Unfortunately, she began developing shortness of breath and was confirmed to have worsening PE by CT angiogram. This required thrombolysis and IVC filter placement.   She had a superficial blood clot in her left calf in 07/2017. She was not on Cimzia at that time. Blood clot went away on it's own. Red "streaking" of the leg and her the pain went away in 6 weeks.  She has been on 380m of aspirin after her superficial blood clot. She was placed on eliquis since her acute DVT and PE.   She denies family history of blood clots. She had not been traveling around that time and was being more active. She was on Birth  control in the past and blood clots never occurred. She was never pregnant. Her previous stricture resection surgery did not lead to a blood clot.   She was diagnosed with Crohn's disease when she was on 89 She reviewed her medication for this. She had a stricture resected 12 years ago. She denies having IV iron or blood transfusions. She has not had a period in 8 years. She is currently on Mirena. Prior to did not have menorrhagia. Last colonoscopy was fall 2018.   On review of symptoms, pt notes she has rarely has GI bleeding. Breathing is back to baseline, only some shortness of breath upon significant exertion.    INTERVAL HISTORY  [place] WEATHERWAXreturns for a follow up of her acute DVT and PE. The patient's last visit with [place] on 03/14/2018. The pt reports that she is doing well overall.  The pt reports that she tried taking PO Iron for a few months, but discontinued due to rectal bleeding. She has chronic diarrhea and had no constipation or worsening of her diarrhea with the PO Ferrous Sulfate. Pt had significant fever after receiving a three doses of Venofer. Pt is currently taking Entyvio for her Crohn's, which is relatively well-controlled. She gives herself monthly Vitamin B12 injections at the direction of her Gastroenterologist.   She was diagnosed with Multiple Sclerosis within the last year, for which she is on Ocrevus infusions every 6 months. Prior to her diagnosis pt had trouble walking, fatigue, and would spontaneously evacuate  her bladder. She is following with Dr. Tomi Likens for her MS.   Lab results (04/11/20) of CBC w/diff is as follows: all values are WNL except for Hgb at 10.8, HCT at 33.1, PLT at 409K, Neutro Rel at 79.2, Lymphs Rel at 8.9. 04/11/2020 Ferritin at 3.9  On review of systems, pt reports chronic diarrhea, leg swelling and denies any other symptoms.    MEDICAL HISTORY:  Past Medical History:  Diagnosis Date  . Anemia    related to Crohns flares  . Arm  DVT (deep venous thromboembolism), acute, right ([place]) 11/29/2017  . Arthritis    knees, feet, hands, wrists, related to Crohns flare  . Asthma    Childhood  . B12 deficiency    monitored/treated by Dr. Carlean Purl  . Cervical dysplasia   . Crohn's disease of small intestine ([place]) 06/24/2012   Diagnosed 1999, in [place]. Ileitis only then. Treated with Imuran Remicade and prednisone.  Noncompliant with therapy. 2004 return to care and was treated with Remicade prednisone Pentasa Cipro and Flagyl. 2006 status post right hemicolectomy. Subsequently treated with azathioprine and Cimzia. 200 mg every other week.  azathioprine was added in 2010. Prometheus TP MT enzyme was negative.  . Depression   . DVT of upper extremity (deep vein thrombosis) ([place]) 11/28/2017  . Eczema    arms and behind knees, worse in winter  . Generalized anxiety disorder   . History of recurrent UTI (urinary tract infection)   . HPV (human papilloma virus) infection   . Hyperlipemia   . Hypertriglyceridemia 07/03/2012   10/2010 labs showed level of 753 with total cholesterol 180 and HDL 41' Labs 05/2013--TG 180   . Major depressive disorder in remission ([place]) 10/03/2013  . Multiple sclerosis ([place]) 2019   Officially diagnosed in the Fall  . Neuromuscular disorder ([place])   . Osteopenia 09/29/2011   T-1.6  . Papanicolaou smear 12/12   last abnormal 2011  . Pulmonary embolism ([place]) 11/30/2017  . Scaphoid fracture of wrist 2013   left  . Seasonal allergic rhinitis   . Shingles 09/2015   R hip  . Vitamin B12 deficiency 06/19/2012  . Vitamin D deficiency 12/16/2015   Vitamin D-OH level 12     SURGICAL HISTORY: Past Surgical History:  Procedure Laterality Date  . APPENDECTOMY    . CERVICAL BIOPSY  W/ LOOP ELECTRODE EXCISION  2009   ---paps normal since  . COLONOSCOPY  multiple   scanned  . ESOPHAGOGASTRODUODENOSCOPY  multiple   scanned  . HEMICOLECTOMY  2006  . IR ANGIOGRAM PULMONARY BILATERAL SELECTIVE  12/03/2017    . IR ANGIOGRAM SELECTIVE EACH ADDITIONAL VESSEL  12/03/2017  . IR ANGIOGRAM SELECTIVE EACH ADDITIONAL VESSEL  12/03/2017  . IR INFUSION THROMBOL ARTERIAL INITIAL (MS)  12/03/2017  . IR INFUSION THROMBOL ARTERIAL INITIAL (MS)  12/03/2017  . IR IVC FILTER PLMT / S&I /IMG GUID/MOD SED  12/04/2017  . IR THROMB F/U EVAL ART/VEN FINAL DAY (MS)  12/04/2017  . IR US GUIDE VASC ACCESS RIGHT  12/03/2017  . IVC FILTER REMOVAL N/A 07/11/2018   Procedure: IVC FILTER REMOVAL;  Surgeon: Serafina Mitchell, MD;  Location: [facility];  Service: Cardiovascular;  Laterality: N/A;  . IVC VENOGRAPHY N/A 07/11/2018   Procedure: IVC Venography;  Surgeon: Serafina Mitchell, MD;  Location: [facility];  Service: Cardiovascular;  Laterality: N/A;  . LEEP  2011   --done in [place]  . PERIPHERAL VASCULAR BALLOON ANGIOPLASTY Right 11/30/2017   Procedure:  PERIPHERAL VASCULAR BALLOON ANGIOPLASTY;  Surgeon: Serafina Mitchell, MD;  Location: [facility];  Service: Cardiovascular;  Laterality: Right;  . PERIPHERAL VASCULAR THROMBECTOMY N/A 11/29/2017   Procedure: PERIPHERAL VASCULAR THROMBECTOMY - THROMBOLYSIS;  Surgeon: Serafina Mitchell, MD;  Location: [facility];  Service: Cardiovascular;  Laterality: N/A;  LYSIS CATHETER PLACEMENT ONLY  . PERIPHERAL VASCULAR THROMBECTOMY N/A 11/30/2017   Procedure: PERIPHERAL VASCULAR THROMBECTOMY - Lysis Recheck;  Surgeon: Serafina Mitchell, MD;  Location: [facility];  Service: Cardiovascular;  Laterality: N/A;  . WISDOM TOOTH EXTRACTION      SOCIAL HISTORY: Social History   Socioeconomic History  . Marital status: Married    Spouse name: Marcello Moores  . Number of children: 0  . Years of education: 65  . Highest education level: Associate degree: occupational, Hotel manager, or vocational program  Occupational History  . 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 Smoking status: Former Smoker    Packs/day: 1.00    Years: 4.00    Pack  years: 4.00    Quit date: 11/18/1998    Years since quitting: 21.4  . Smokeless tobacco: Never Used  Vaping Use  . Vaping Use: Never used  Substance and Sexual Activity  . Alcohol use: No    Alcohol/week: 0.0 standard drinks  . Drug use: No  . Sexual activity: Yes    Partners: Male    Birth control/protection: I.U.D.    Comment: Mirena inserted 01/2016  Other Topics Concern  . Not on file  Social History Narrative   The patient is divorced.     Re-married Marcello Moores, partner of 10 years, on 10/10/16. 2 dogs, 1 cat   No children - doesn't want any   Curator for [employer].   Moved from [place] to [place] in 2013.   Past smoker   No alcohol   2-3 caffeinated beverages a day   She reports she is compliant with sunscreen given her increased risk of sun damage and skin cancer (no longer on azathioprine)      Updated 12/24/19   Social Determinants of Health   Financial Resource Strain:   . Difficulty of Paying Living Expenses:   Food Insecurity:   . Worried About Charity fundraiser in the Last Year:   . Arboriculturist in the Last Year:   Transportation Needs:   . Film/video editor (Medical):   Marland Kitchen Lack of Transportation (Non-Medical):   Physical Activity:   . Days of Exercise per Week:   . Minutes of Exercise per Session:   Stress:   . Feeling of Stress :   Social Connections:   . Frequency of Communication with Friends and Family:   . Frequency of Social Gatherings with Friends and Family:   . Attends Religious Services:   . Active Member of Clubs or Organizations:   . Attends Archivist Meetings:   Marland Kitchen Marital Status:   Intimate Partner Violence:   . Fear of Current or Ex-Partner:   . Emotionally Abused:   Marland Kitchen Physically Abused:   . Sexually Abused:     FAMILY HISTORY: Family History  Problem Relation Age of Onset  . Hypertension Mother   . Breast cancer Mother 53  . Bone cancer Mother        metastatic from breast  .  Depression Mother   . Hypothyroidism Mother   . Colon polyps Father   .  Diabetes Father        borderline--resolved 2020  . Hypertension Father        off meds now  . Hyperlipidemia Father        off meds now  . Fuch's dystrophy Father   . Colon cancer Paternal Grandfather   . Multiple sclerosis Sister        affecting brain only  . Alcohol abuse Sister   . Fuch's dystrophy Sister   . Stroke Maternal Grandmother   . Lung cancer Maternal Grandmother   . Hypertension Maternal Grandmother   . Heart disease Paternal Grandmother   . Hypertension Paternal Grandmother   . Hypertension Sister     ALLERGIES:  is allergic to iron, venofer [iron sucrose], ibuprofen, morphine and related, prednisone, and adhesive [tape].  MEDICATIONS:  Current Outpatient Medications  Medication Sig Dispense Refill  . acetaminophen (TYLENOL) 500 MG tablet Take 1,000 mg by mouth every 6 (six) hours as needed for headache (pain). Reported on 12/15/2015    . baclofen (LIORESAL) 10 MG tablet TAKE 1 TABLET(10 MG) BY MOUTH THREE TIMES DAILY AS NEEDED FOR MUSCLE SPASMS 90 tablet 1  . buPROPion (WELLBUTRIN SR) 100 MG 12 hr tablet Take 1 tablet (100 mg total) by mouth 2 (two) times daily. 180 tablet 3  . cetirizine (ZYRTEC) 10 MG tablet Take 10 mg by mouth at bedtime.     . CHOLECALCIFEROL PO Take 5,000 Units by mouth 2 (two) times daily.    . colestipol (COLESTID) 5 g packet TAKE 5 GRAMS BY MOUTH DAILY 30 each 5  . cyanocobalamin (,VITAMIN B-12,) 1000 MCG/ML injection INJECT 1 ML IN THE MUSCLE WEEKLY FOR 4 WEEKS, THEN 1 ML MONTHLY THEREAFTER 10 mL 1  . dalfampridine 10 MG TB12 Take by mouth 2 (two) times daily.     Marland Kitchen ELIQUIS 5 MG TABS tablet TAKE 1 TABLET BY MOUTH  TWICE DAILY 60 tablet 0  . escitalopram (LEXAPRO) 10 MG tablet TAKE 1 TABLET(10 MG) BY MOUTH AT BEDTIME 90 tablet 3  . fenofibrate 160 MG tablet TAKE 1 TABLET BY MOUTH AT  BEDTIME 90 tablet 0  . gabapentin (NEURONTIN) 100 MG capsule TAKE 3 CAPSULES(300  MG) BY MOUTH AT BEDTIME 180 capsule 3  . levonorgestrel (MIRENA) 20 MCG/24HR IUD 1 each by Intrauterine route once. Implanted April 2017    . ocrelizumab 600 mg in sodium chloride 0.9 % 500 mL Inject 600 mg into the vein every 6 (six) months.    Marland Kitchen OVER THE COUNTER MEDICATION Apply 1 application topically daily as needed (arthritis pain). CBD Oil    . vedolizumab (ENTYVIO) 300 MG injection Inject 300 mg into the vein every 8 (eight) weeks. 1 vial 6  . Biotin 10000 MCG TABS Take by mouth. (Patient not taking: Reported on 05/07/2020)    . diphenhydrAMINE (BENADRYL) 25 MG tablet Take 25 mg by mouth at bedtime as needed for sleep. (Patient not taking: Reported on 05/07/2020)     No current facility-administered medications for this visit.    REVIEW OF SYSTEMS:   A 10+ POINT REVIEW OF SYSTEMS WAS OBTAINED including neurology, dermatology, psychiatry, cardiac, respiratory, lymph, extremities, GI, GU, Musculoskeletal, constitutional, breasts, reproductive, HEENT.  All pertinent positives are noted in the HPI.  All others are negative.   PHYSICAL EXAMINATION: ECOG PERFORMANCE STATUS: 1 - Symptomatic but completely ambulatory  Vitals:   05/07/20 0939  BP: 127/80  Pulse: 80  Resp: 17  Temp: 97.9 F (36.6 C)  SpO2: 98%  Filed Weights   05/07/20 0939  Weight: 234 lb 12.8 oz (106.5 kg)   .Body mass index is 40.3 kg/m.  Exam was given in a chair   GENERAL:alert, in no acute distress and comfortable SKIN: no acute rashes, no significant lesions EYES: conjunctiva are pink and non-injected, sclera anicteric OROPHARYNX: MMM, no exudates, no oropharyngeal erythema or ulceration NECK: supple, no JVD LYMPH:  no palpable lymphadenopathy in the cervical, axillary or inguinal regions LUNGS: clear to auscultation b/l with normal respiratory effort HEART: regular rate & rhythm ABDOMEN:  normoactive bowel sounds , non tender, not distended. No palpable hepatosplenomegaly.  Extremity: 2+ pedal  edema PSYCH: alert & oriented x 3 with fluent speech NEURO: no focal motor/sensory deficits  LABORATORY DATA:  I have reviewed the data as listed  . CBC Latest Ref Rng & Units 04/11/2020 12/18/2019 10/18/2019  WBC 4.0 - 10.5 K/uL 9.8 7.3 9.5  Hemoglobin 12.0 - 15.0 g/dL 10.8(L) 13.1 12.5  Hematocrit 36 - 46 % 33.1(L) 40.7 38.4  Platelets 150 - 400 K/uL 409.0(H) 475(H) 451.0(H)   . CBC    Component Value Date/Time   WBC 9.8 04/11/2020 1003   RBC 3.97 04/11/2020 1003   HGB 10.8 (L) 04/11/2020 1003   HGB 13.1 12/18/2019 0820   HCT 33.1 (L) 04/11/2020 1003   HCT 40.7 12/18/2019 0820   PLT 409.0 (H) 04/11/2020 1003   PLT 475 (H) 12/18/2019 0820   MCV 83.4 04/11/2020 1003   MCV 90 12/18/2019 0820   MCH 29.0 12/18/2019 0820   MCH 27.4 07/19/2018 1544   MCHC 32.5 04/11/2020 1003   RDW 14.5 04/11/2020 1003   RDW 13.0 12/18/2019 0820   LYMPHSABS 0.9 04/11/2020 1003   LYMPHSABS 1.1 12/18/2019 0820   MONOABS 1.0 04/11/2020 1003   EOSABS 0.1 04/11/2020 1003   EOSABS 0.1 12/18/2019 0820   BASOSABS 0.0 04/11/2020 1003   BASOSABS 0.0 12/18/2019 0820     . CMP Latest Ref Rng & Units 12/18/2019 07/18/2019 12/15/2018  Glucose 65 - 99 mg/dL 80 81 80  BUN 6 - 24 mg/dL 9 12 15   Creatinine 0.57 - 1.00 mg/dL 0.88 1.01(H) 1.01(H)  Sodium 134 - 144 mmol/L 141 138 140  Potassium 3.5 - 5.2 mmol/L 4.2 4.2 4.5  Chloride 96 - 106 mmol/L 103 103 105  CO2 20 - 29 mmol/L 24 21 21   Calcium 8.7 - 10.2 mg/dL 9.0 8.8 8.9  Total Protein 6.0 - 8.5 g/dL 6.5 6.2 6.9  Total Bilirubin 0.0 - 1.2 mg/dL 0.2 <0.2 <0.2  Alkaline Phos 39 - 117 IU/L 53 51 64  AST 0 - 40 IU/L 18 21 31   ALT 0 - 32 IU/L 17 16 41(H)   Component     Latest Ref Rng & Units 01/09/2018  PTT Lupus Anticoagulant     0.0 - 51.9 sec 34.3  DRVVT     0.0 - 47.0 sec 53.9 (H)  Lupus Anticoag Interp      Comment:  Beta-2 Glycoprotein I Ab, IgG     0 - 20 GPI IgG units 9  Beta-2-Glycoprotein I IgM     0 - 32 GPI IgM units <9   Beta-2-Glycoprotein I IgA     0 - 25 GPI IgA units <9  Anticardiolipin Ab,IgG,Qn     0 - 14 GPL U/mL <9  Anticardiolipin Ab,IgM,Qn     0 - 12 MPL U/mL 13 (H)  Anticardiolipin Ab,IgA,Qn     0 - 11 APL U/mL <9  Lupus anticoagulant panel      The value has a corrected status.      No reference range information available      Resulting Lab: [facility]      Comments:           (NOTE)           No lupus anticoagulant was detected  RADIOGRAPHIC STUDIES: I have personally reviewed the radiological images as listed and agreed with the findings in the report. MR BRAIN W WO CONTRAST  Result Date: 05/03/2020 CLINICAL DATA:  Multiple sclerosis. Additional provided by scanning technologist: Patient with no complaints on the day of exam. EXAM: MRI HEAD WITHOUT AND WITH CONTRAST TECHNIQUE: Multiplanar, multiecho pulse sequences of the brain and surrounding structures were obtained without and with intravenous contrast. CONTRAST:  35m MULTIHANCE GADOBENATE DIMEGLUMINE 529 MG/ML IV SOLN COMPARISON:  Brain MRI 07/24/2018 FINDINGS: Brain: Again demonstrated are scattered subcortical/juxtacortical and deep periventricular white matter foci of T2/FLAIR hyperintensity. No new lesion is identified as compared to the brain MRI of 07/24/2018. There is a punctate focus of diffusion weighted hyperintensity within the periventricular right frontoparietal white matter which is too small to accurately characterize on the ADC map. However, the underlying T2 hyperintense lesion at this site was present on the prior MRI, there is no abnormal enhancement at this site, and findings likely reflect T2 shine through. There is no acute infarct. No evidence of intracranial mass. No chronic intracranial blood products. No extra-axial fluid collection. No midline shift. No abnormal intracranial enhancement. Vascular: Expected proximal arterial flow voids. Non dominant intracranial left vertebral artery. Skull  and upper cervical spine: No focal marrow lesion. Sinuses/Orbits: Visualized orbits show no acute finding. Minimal ethmoid sinus mucosal thickening. No significant mastoid effusion. IMPRESSION: Unchanged mild subcortical/juxtacortical and deep periventricular white matter foci of signal abnormality consistent with the provided history of multiple sclerosis. No new or enhancing lesion is identified. Minimal ethmoid sinus mucosal thickening. Electronically Signed   By: KKellie SimmeringDO   On: 05/03/2020 21:07    ASSESSMENT & PLAN:   PRONNE SAVOIAis a 44y.o. caucasian female with   1. Acute DVT and Submassive PE No definitive provocation of DVT. PE likely from dislodged DVT after mechanical thrombectomy and balloon venoplasty Has IVC filter placed.  On Eliquis since 12/2017 Non smoker. No recent use of hormonal contracept. Prothrombin gene mutation/Factor V leiden mutation negative, Anti-phoshopholipid ab negative. Jak2 testing results negative. Lupus negative. Based on results, elevated platelets likely had a reactive cause due to inflammation from Crohn's and elevated iron.   Body mass index is 40.3 kg/m. - obesity could an additional contributing risk factor. 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  2. Elevated platelets  Have been elevated 300-800K for over a year  -likely reactive to iron deficiency anemia and inflammation from Crohn's disease  3. Iron Deficiency Anemia . Lab Results  Component Value Date   IRON 63 03/14/2018   TIBC 534 (H) 03/14/2018   IRONPCTSAT 12 (L) 03/14/2018   (Iron and TIBC)  Lab Results  Component Value Date   FERRITIN 3.9 (L) 04/11/2020  -on ferrous sulfate BID. Tolerating well -continue PO ironto maintain ferritin levels >50. -if still low in 3 months would consider IV iron replacement.  4. Vitamin B12 deficiency  -seen at 12947on 01/09/18 labs    -Currently on monthly  B12 injections with PCP, will continue    5. Crohn's Disease -Mx by Dr. Carlean Purl -On Imuran and Entyvio   PLAN:  -Discussed pt labwork, 04/11/20; mild anemia, PLT & WBC are nml, Ferritin is very low -Advised pt that PO Iron may not help correct anemia with ongoing blood loss. Pt has had issues tolerating PO Iron previously.  -Will give Injectafer as preferred IV Iron preparation to lower risk of allergic reaction (patient had high grade fevers with Venofer). Will premedicate to prevent allergic reactions.  -Recommend pt receive the COVID19 vaccine ASAP. Discussed the risk of blood clots with certain vaccine preparations.  -Continue f/u with Dr. Carlean Purl for Crohn's Disease and Dr. Tomi Likens for MS  -Continue monthly Vitamin B12 injections  -Continue 5 mg Eliquis BID -Will get labs today  -Will give IV Injectafer weekly x2 -Will see back in 3 months with labs     FOLLOW UP: Labs today IV Injectafer weekly x 2 doses in 1 week RTC with Dr Irene Limbo with labs in 3 months     The total time spent in the appt was 30 minutes and more than 50% was on counseling and direct patient cares and subsequent call to discussed lab results and recommendations.  All of the patient's questions were answered with apparent satisfaction. The patient knows to call the clinic with any problems, questions or concerns.    Sullivan Lone MD [place] AAHIVMS [facility] Hematology/Oncology Physician [facility]  (Office):       [phone] (Work cell):  [phone] (Fax):           [phone]  05/07/2020 10:04 AM  I, Yevette Edwards, am acting as a scribe for Dr. Sullivan Lone.   .I have reviewed the above documentation for accuracy and completeness, and I agree with the above. Brunetta Genera MD   ADDENDUM  . CBC Latest Ref Rng & Units 05/07/2020 04/11/2020 12/18/2019  WBC 4.0 - 10.5 K/uL 8.5 9.8 7.3  Hemoglobin 12.0 - 15.0 g/dL 10.3(L) 10.8(L) 13.1  Hematocrit 36 - 46 %  33.3(L) 33.1(L) 40.7  Platelets 150 - 400 K/uL 421(H) 409.0(H) 475(H)    . CMP Latest Ref Rng & Units 05/07/2020 12/18/2019 07/18/2019  Glucose 70 - 99 mg/dL 82 80 81  BUN 6 - 20 mg/dL 9 9 12   Creatinine 0.44 - 1.00 mg/dL 0.83 0.88 1.01(H)  Sodium 135 - 145 mmol/L 141 141 138  Potassium 3.5 - 5.1 mmol/L 4.0 4.2 4.2  Chloride 98 - 111 mmol/L 107 103 103  CO2 22 - 32 mmol/L 23 24 21   Calcium 8.9 - 10.3 mg/dL 8.7(L) 9.0 8.8  Total Protein 6.5 - 8.1 g/dL 6.9 6.5 6.2  Total Bilirubin 0.3 - 1.2 mg/dL <0.2(L) 0.2 <0.2  Alkaline Phos 38 - 126 U/L 58 53 51  AST 15 - 41 U/L 16 18 21   ALT 0 - 44 U/L 19 17 16    . Lab Results  Component Value Date   IRON 31 (L) 05/07/2020   TIBC 621 (H) 05/07/2020   IRONPCTSAT 5 (L) 05/07/2020   (Iron and TIBC)  Lab Results  Component Value Date   FERRITIN <4 (L) 05/07/2020   B12 175 - deficiency despite being on B12 [place]    PLan -proceed with IV INjectafer weekly x 2 doses with premedications -increase B12 [place] to 1000 mcg weekly x 4 doses and then take every 2 weeks

## 2020-05-09 NOTE — Addendum Note (Signed)
Addended by: Sullivan Lone on: 05/09/2020 11:08 AM   Modules accepted: Level of Service

## 2020-05-13 ENCOUNTER — Telehealth: Payer: Self-pay | Admitting: *Deleted

## 2020-05-13 ENCOUNTER — Telehealth: Payer: Self-pay | Admitting: Hematology

## 2020-05-13 ENCOUNTER — Inpatient Hospital Stay: Payer: 59

## 2020-05-13 NOTE — Telephone Encounter (Signed)
Scheduled appt per 7/13 sh msg - left message for patient with appt date and time

## 2020-05-13 NOTE — Telephone Encounter (Signed)
Per [employer], injectafer is non preferred. Patient has had reactions to preferred [employer]) iron preparations in past. MD and [facility] pharmacist will appeal. Explained insurance issue to patient. She is in agreement to wait for infusion pending insurance approval.  Currently has appt 7/20 for 2nd dose of IV iron - it is now appointment for first dose pending insurance approval. Will send schedule message to schedule on 7/27 for 2nd dose.

## 2020-05-15 ENCOUNTER — Other Ambulatory Visit: Payer: Self-pay

## 2020-05-15 ENCOUNTER — Ambulatory Visit (INDEPENDENT_AMBULATORY_CARE_PROVIDER_SITE_OTHER): Payer: 59 | Admitting: Obstetrics and Gynecology

## 2020-05-15 ENCOUNTER — Encounter: Payer: Self-pay | Admitting: Obstetrics and Gynecology

## 2020-05-15 ENCOUNTER — Ambulatory Visit (INDEPENDENT_AMBULATORY_CARE_PROVIDER_SITE_OTHER): Payer: 59

## 2020-05-15 ENCOUNTER — Encounter: Payer: Self-pay | Admitting: Hematology

## 2020-05-15 VITALS — BP 120/76 | HR 80 | Ht 64.0 in | Wt 235.0 lb

## 2020-05-15 DIAGNOSIS — Z30431 Encounter for routine checking of intrauterine contraceptive device: Secondary | ICD-10-CM

## 2020-05-15 DIAGNOSIS — Z6841 Body Mass Index (BMI) 40.0 and over, adult: Secondary | ICD-10-CM | POA: Diagnosis not present

## 2020-05-15 NOTE — Progress Notes (Signed)
GYNECOLOGY  VISIT   HPI: 44 y.o.   Married  Caucasian  female   G0P0000 with No LMP recorded. (Menstrual status: IUD).   here for pelvic ultrasound.    Patient had limited pelvic exam at her routine office visit, and this prompted the [place] today.  She has a Mirena IUD.   GYNECOLOGIC HISTORY: No LMP recorded. (Menstrual status: IUD). Contraception: Mirena IUD 02-12-16 Menopausal hormone therapy:  n/a Last mammogram: 12-20-18 3D/Neg/density B/BiRads1.  She will schedule Last pap smear:  01-09-18 Neg:Neg HR HPV, 01-13-16 Neg:Neg HR HPV        OB History    Gravida  0   Para  0   Term  0   Preterm  0   AB  0   Living  0     SAB  0   TAB  0   Ectopic  0   Multiple  0   Live Births                 Patient Active Problem List   Diagnosis Date Noted  . Multiple falls 08/21/2019  . Hematoma of right flank 08/21/2019  . Anticoagulant long-term use 07/17/2019  . Multiple sclerosis ([place]) 12/19/2018  . Pulmonary nodule 07/19/2018  . 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 Dvt femoral (deep venous thrombosis) ([place]) 11/30/2017  . Arm DVT (deep venous thromboembolism), acute, right ([place]) 11/29/2017  . Pulmonary embolus ([place]) 11/27/2017  . Iron deficiency anemia due to chronic blood loss 10/05/2017  . Heartburn 10/05/2017  . Other fatigue 10/05/2017  . Vitamin D deficiency 12/16/2015  . Major depressive disorder in remission ([place]) 10/03/2013  . Hypertriglyceridemia 07/03/2012  . Crohn's disease of small intestine ([place]) 06/24/2012  . Long-term use of immunosuppressant medication Entyvio + ocrelizumab 06/24/2012  . Vitamin B12 deficiency 06/19/2012  . Osteopenia of femur neck T. score -1.4 09/29/2011    Past Medical History:  Diagnosis Date  . Anemia    related to Crohns flares  . Arm DVT (deep venous thromboembolism), acute, right ([place]) 11/29/2017  . Arthritis    knees, feet, hands, wrists, related to Crohns flare  . Asthma    Childhood  . B12 deficiency     monitored/treated by Dr. Carlean Purl  . Cervical dysplasia   . Crohn's disease of small intestine ([place]) 06/24/2012   Diagnosed 1999, in [place]. Ileitis only then. Treated with Imuran Remicade and prednisone.  Noncompliant with therapy. 2004 return to care and was treated with Remicade prednisone Pentasa Cipro and Flagyl. 2006 status post right hemicolectomy. Subsequently treated with azathioprine and Cimzia. 200 mg every other week.  azathioprine was added in 2010. Prometheus TP MT enzyme was negative.  . Depression   . DVT of upper extremity (deep vein thrombosis) ([place]) 11/28/2017  . Eczema    arms and behind knees, worse in winter  . Generalized anxiety disorder   . History of recurrent UTI (urinary tract infection)   . HPV (human papilloma virus) infection   . Hyperlipemia   . Hypertriglyceridemia 07/03/2012   10/2010 labs showed level of 753 with total cholesterol 180 and HDL 41' Labs 05/2013--TG 180   . Major depressive disorder in remission ([place]) 10/03/2013  . Multiple sclerosis ([place]) 2019   Officially diagnosed in the Fall  . Neuromuscular disorder ([place])   . Osteopenia 09/29/2011   T-1.6  . Papanicolaou smear 12/12   last abnormal 2011  . Pulmonary embolism ([place]) 11/30/2017  . Scaphoid fracture of wrist 2013  left  . Seasonal allergic rhinitis   . Shingles 09/2015   R hip  . Vitamin B12 deficiency 06/19/2012  . Vitamin D deficiency 12/16/2015   Vitamin D-OH level 12     Past Surgical History:  Procedure Laterality Date  . APPENDECTOMY    . CERVICAL BIOPSY  W/ LOOP ELECTRODE EXCISION  2009   ---paps normal since  . COLONOSCOPY  multiple   scanned  . ESOPHAGOGASTRODUODENOSCOPY  multiple   scanned  . HEMICOLECTOMY  2006  . IR ANGIOGRAM PULMONARY BILATERAL SELECTIVE  12/03/2017  . IR ANGIOGRAM SELECTIVE EACH ADDITIONAL VESSEL  12/03/2017  . IR ANGIOGRAM SELECTIVE EACH ADDITIONAL VESSEL  12/03/2017  . IR INFUSION THROMBOL ARTERIAL INITIAL (MS)  12/03/2017  . IR INFUSION THROMBOL  ARTERIAL INITIAL (MS)  12/03/2017  . IR IVC FILTER PLMT / S&I /IMG GUID/MOD SED  12/04/2017  . IR THROMB F/U EVAL ART/VEN FINAL DAY (MS)  12/04/2017  . IR US GUIDE VASC ACCESS RIGHT  12/03/2017  . IVC FILTER REMOVAL N/A 07/11/2018   Procedure: IVC FILTER REMOVAL;  Surgeon: Serafina Mitchell, MD;  Location: [facility];  Service: Cardiovascular;  Laterality: N/A;  . IVC VENOGRAPHY N/A 07/11/2018   Procedure: IVC Venography;  Surgeon: Serafina Mitchell, MD;  Location: [facility];  Service: Cardiovascular;  Laterality: N/A;  . LEEP  2011   --done in [place]  . PERIPHERAL VASCULAR BALLOON ANGIOPLASTY Right 11/30/2017   Procedure: PERIPHERAL VASCULAR BALLOON ANGIOPLASTY;  Surgeon: Serafina Mitchell, MD;  Location: [facility];  Service: Cardiovascular;  Laterality: Right;  . PERIPHERAL VASCULAR THROMBECTOMY N/A 11/29/2017   Procedure: PERIPHERAL VASCULAR THROMBECTOMY - THROMBOLYSIS;  Surgeon: Serafina Mitchell, MD;  Location: [facility];  Service: Cardiovascular;  Laterality: N/A;  LYSIS CATHETER PLACEMENT ONLY  . PERIPHERAL VASCULAR THROMBECTOMY N/A 11/30/2017   Procedure: PERIPHERAL VASCULAR THROMBECTOMY - Lysis Recheck;  Surgeon: Serafina Mitchell, MD;  Location: [facility];  Service: Cardiovascular;  Laterality: N/A;  . WISDOM TOOTH EXTRACTION      Current Outpatient Medications  Medication Sig Dispense Refill  . acetaminophen (TYLENOL) 500 MG tablet Take 1,000 mg by mouth every 6 (six) hours as needed for headache (pain). Reported on 12/15/2015    . baclofen (LIORESAL) 10 MG tablet TAKE 1 TABLET(10 MG) BY MOUTH THREE TIMES DAILY AS NEEDED FOR MUSCLE SPASMS 90 tablet 1  . Biotin 10000 MCG TABS Take by mouth.     Marland Kitchen buPROPion (WELLBUTRIN SR) 100 MG 12 hr tablet Take 1 tablet (100 mg total) by mouth 2 (two) times daily. 180 tablet 3  . cetirizine (ZYRTEC) 10 MG tablet Take 10 mg by mouth at bedtime.     . CHOLECALCIFEROL PO Take 5,000 Units by mouth 2 (two) times daily.     . colestipol (COLESTID) 5 g packet TAKE 5 GRAMS BY MOUTH DAILY 30 each 5  . cyanocobalamin (,VITAMIN B-12,) 1000 MCG/ML injection INJECT 1 ML IN THE MUSCLE WEEKLY FOR 4 WEEKS, THEN 1 ML MONTHLY THEREAFTER 10 mL 1  . dalfampridine 10 MG TB12 Take by mouth 2 (two) times daily.     . diphenhydrAMINE (BENADRYL) 25 MG tablet Take 25 mg by mouth at bedtime as needed for sleep.     Marland Kitchen ELIQUIS 5 MG TABS tablet TAKE 1 TABLET BY MOUTH  TWICE DAILY 60 tablet 0  . escitalopram (LEXAPRO) 10 MG tablet TAKE 1 TABLET(10 MG) BY MOUTH AT BEDTIME 90 tablet 3  .  fenofibrate 160 MG tablet TAKE 1 TABLET BY MOUTH AT  BEDTIME 90 tablet 0  . gabapentin (NEURONTIN) 100 MG capsule TAKE 3 CAPSULES(300 MG) BY MOUTH AT BEDTIME 180 capsule 3  . levonorgestrel (MIRENA) 20 MCG/24HR IUD 1 each by Intrauterine route once. Implanted April 2017    . ocrelizumab 600 mg in sodium chloride 0.9 % 500 mL Inject 600 mg into the vein every 6 (six) months.    Marland Kitchen OVER THE COUNTER MEDICATION Apply 1 application topically daily as needed (arthritis pain). CBD Oil    . vedolizumab (ENTYVIO) 300 MG injection Inject 300 mg into the vein every 8 (eight) weeks. 1 vial 6   No current facility-administered medications for this visit.     ALLERGIES: Iron, Venofer [iron sucrose], Ibuprofen, Morphine and related, Prednisone, and Adhesive [tape]  Family History  Problem Relation Age of Onset  . Hypertension Mother   . Breast cancer Mother 14  . Bone cancer Mother        metastatic from breast  . Depression Mother   . Hypothyroidism Mother   . Colon polyps Father   . Diabetes Father        borderline--resolved 2020  . Hypertension Father        off meds now  . Hyperlipidemia Father        off meds now  . Fuch's dystrophy Father   . Colon cancer Paternal Grandfather   . Multiple sclerosis Sister        affecting brain only  . Alcohol abuse Sister   . Fuch's dystrophy Sister   . Stroke Maternal Grandmother   . Lung cancer Maternal  Grandmother   . Hypertension Maternal Grandmother   . Heart disease Paternal Grandmother   . Hypertension Paternal Grandmother   . Hypertension Sister     Social History   Socioeconomic History  . Marital status: Married    Spouse name: Marcello Moores  . Number of children: 0  . Years of education: 38  . Highest education level: Associate degree: occupational, Hotel manager, or vocational program  Occupational History  . 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 Smoking status: Former Smoker    Packs/day: 1.00    Years: 4.00    Pack years: 4.00    Quit date: 11/18/1998    Years since quitting: 21.5  . Smokeless tobacco: Never Used  Vaping Use  . Vaping Use: Never used  Substance and Sexual Activity  . Alcohol use: No    Alcohol/week: 0.0 standard drinks  . Drug use: No  . Sexual activity: Yes    Partners: Male    Birth control/protection: I.U.D.    Comment: Mirena inserted 01/2016  Other Topics Concern  . Not on file  Social History Narrative   The patient is divorced.     Re-married Marcello Moores, partner of 10 years, on 10/10/16. 2 dogs, 1 cat   No children - doesn't want any   Curator for [employer].   Moved from [place] to [place] in 2013.   Past smoker   No alcohol   2-3 caffeinated beverages a day   She reports she is compliant with sunscreen given her increased risk of sun damage and skin cancer (no longer on azathioprine)      Updated 12/24/19   Social Determinants of Health   Financial Resource Strain:   . Difficulty of Paying Living Expenses:   Food Insecurity:   . Worried About Running  Out of Food in the Last Year:   . [place] in the Last Year:   Transportation Needs:   . Lack of Transportation (Medical):   Marland Kitchen Lack of Transportation (Non-Medical):   Physical Activity:   . Days of Exercise per Week:   . Minutes of Exercise per Session:   Stress:   . Feeling of Stress :   Social Connections:   .  Frequency of Communication with Friends and Family:   . Frequency of Social Gatherings with Friends and Family:   . Attends Religious Services:   . Active Member of Clubs or Organizations:   . Attends Archivist Meetings:   Marland Kitchen Marital Status:   Intimate Partner Violence:   . Fear of Current or Ex-Partner:   . Emotionally Abused:   Marland Kitchen Physically Abused:   . Sexually Abused:     Review of Systems  All other systems reviewed and are negative.   PHYSICAL EXAMINATION:    BP 120/76 (Cuff Size: Large)   Pulse 80   Ht 5' 4"  (1.626 m)   Wt 235 lb (106.6 kg)   BMI 40.34 kg/m     General appearance: alert, cooperative and appears stated age   Pelvic US  Uterus no masses.  EMS 3.02 mm. IUD in normal position in endometrial canal. Ovaries normal.  No free fluid.  ASSESSMENT  Normal pelvic ultrasound.  BMI 40.34 IUD check up.  PLAN  Reassurance regarding normal pelvic ultrasound and IUD position. Report and images reviewed.  FU for annual exam and prn.

## 2020-05-16 NOTE — Progress Notes (Signed)
Intravenous Iron Formulation Change  Lindsay Mccarthy has insurance that requires a change in intravenous iron product from Injectifer to a preferred agent of [employer]. Given her history of infusion reaction to Venofer, a peer-to-peer was attempted to allow patient to receive Injectifer. This request was denied, as patient's insurance requires her to "fail" two of venofer, ferrlecit, or infed. Drug assistance for Injectafer was also not available.   I called the patient to discuss her options, and she was willing to try Ferrlecit iron therapy. She will receive premedications to help prevent infusion issues.   Plan discussed with Dr. Irene Limbo. He is okay with the patient trialing Ferrlecit with premedications.   Orders have been updated to reflect this change and scheduling message sent to adjust infusion appointments.  Allergies:  Allergies  Allergen Reactions  . Iron Other (See Comments)    Blood in stool/Rectal Bleeding  . Venofer [Iron Sucrose] Other (See Comments)    Fever, infusion reaction   . Ibuprofen Other (See Comments)    Avoids due to Crohns disease  . Morphine And Related Itching    itchy  . Prednisone Other (See Comments)    "crazy", hallucinations Tolerates methylprednisolone  . Adhesive [Tape] Rash    Rash with electrodes    The plan for iron therapy is as follows: Ferric gluconate 149m IV weekly x4 infusions. Premedications to include acetaminophen, methylprednisolone, diphenhydramine, and famotidine as per orders.  MDemetrius Charity PharmD, BCPS, BP[facility]ineOn[facility] Pharmacist Pharmacy Phone: [phone]/16/2021

## 2020-05-19 ENCOUNTER — Telehealth: Payer: Self-pay | Admitting: Family Medicine

## 2020-05-19 NOTE — Telephone Encounter (Signed)
That's fine.  We just need to make sure and get vitals at her lab visit, make sure that is noted.

## 2020-05-19 NOTE — Telephone Encounter (Signed)
Informed pt and noted on appt to get vitals

## 2020-05-19 NOTE — Telephone Encounter (Signed)
Pt called and is wanting to know if she can do her medcheck on 07/03/2020 as a virtual she is having her labs on august the 30th, states a virtual would be much easier for her she can be reached at [phone]

## 2020-05-20 ENCOUNTER — Inpatient Hospital Stay: Payer: 59

## 2020-05-20 ENCOUNTER — Other Ambulatory Visit: Payer: Self-pay

## 2020-05-20 ENCOUNTER — Telehealth: Payer: Self-pay | Admitting: Internal Medicine

## 2020-05-20 VITALS — BP 126/74 | HR 84 | Temp 98.2°F | Resp 18

## 2020-05-20 DIAGNOSIS — Z86718 Personal history of other venous thrombosis and embolism: Secondary | ICD-10-CM | POA: Diagnosis not present

## 2020-05-20 DIAGNOSIS — D500 Iron deficiency anemia secondary to blood loss (chronic): Secondary | ICD-10-CM

## 2020-05-20 MED ORDER — METHYLPREDNISOLONE SODIUM SUCC 125 MG IJ SOLR
125.0000 mg | Freq: Once | INTRAMUSCULAR | Status: AC
  Administered 2020-05-20: 125 mg via INTRAVENOUS

## 2020-05-20 MED ORDER — METHYLPREDNISOLONE SODIUM SUCC 125 MG IJ SOLR
INTRAMUSCULAR | Status: AC
  Filled 2020-05-20: qty 2

## 2020-05-20 MED ORDER — DIPHENHYDRAMINE HCL 50 MG/ML IJ SOLN
INTRAMUSCULAR | Status: AC
  Filled 2020-05-20: qty 1

## 2020-05-20 MED ORDER — ACETAMINOPHEN 325 MG PO TABS
650.0000 mg | ORAL_TABLET | Freq: Once | ORAL | Status: AC
  Administered 2020-05-20: 650 mg via ORAL

## 2020-05-20 MED ORDER — SODIUM CHLORIDE 0.9 % IV SOLN
Freq: Once | INTRAVENOUS | Status: AC
  Filled 2020-05-20: qty 250

## 2020-05-20 MED ORDER — DIPHENHYDRAMINE HCL 50 MG/ML IJ SOLN
50.0000 mg | Freq: Once | INTRAMUSCULAR | Status: AC
  Administered 2020-05-20: 50 mg via INTRAVENOUS

## 2020-05-20 MED ORDER — FAMOTIDINE IN NACL 20-0.9 MG/50ML-% IV SOLN
INTRAVENOUS | Status: AC
  Filled 2020-05-20: qty 50

## 2020-05-20 MED ORDER — ACETAMINOPHEN 325 MG PO TABS
ORAL_TABLET | ORAL | Status: AC
  Filled 2020-05-20: qty 2

## 2020-05-20 MED ORDER — FAMOTIDINE IN NACL 20-0.9 MG/50ML-% IV SOLN
20.0000 mg | Freq: Once | INTRAVENOUS | Status: AC
  Administered 2020-05-20: 20 mg via INTRAVENOUS

## 2020-05-20 MED ORDER — SODIUM CHLORIDE 0.9 % IV SOLN
125.0000 mg | Freq: Once | INTRAVENOUS | Status: AC
  Administered 2020-05-20: 125 mg via INTRAVENOUS
  Filled 2020-05-20: qty 10

## 2020-05-20 NOTE — Telephone Encounter (Signed)
Left message for pt to call me back 

## 2020-05-20 NOTE — Patient Instructions (Signed)
Sodium Ferric Gluconate Complex injection What is this medicine? SODIUM FERRIC GLUCONATE COMPLEX (SOE dee um FER ik GLOO koe nate KOM pleks) is an iron replacement. It is used with epoetin therapy to treat low iron levels in patients who are receiving hemodialysis. This medicine may be used for other purposes; ask your health care provider or pharmacist if you have questions. COMMON BRAND NAME(S): Ferrlecit, Nulecit What should I tell my health care provider before I take this medicine? They need to know if you have any of the following conditions:  anemia that is not from iron deficiency  high levels of iron in the body  an unusual or allergic reaction to iron, benzyl alcohol, other medicines, foods, dyes, or preservatives  pregnant or are trying to become pregnant  breast-feeding How should I use this medicine? This medicine is for infusion into a vein. It is given by a health care professional in a hospital or clinic setting. Talk to your pediatrician regarding the use of this medicine in children. While this drug may be prescribed for children as young as 71 years old for selected conditions, precautions do apply. Overdosage: If you think you have taken too much of this medicine contact a poison control center or emergency room at once. NOTE: This medicine is only for you. Do not share this medicine with others. What if I miss a dose? It is important not to miss your dose. Call your doctor or health care professional if you are unable to keep an appointment. What may interact with this medicine? Do not take this medicine with any of the following medications:  deferoxamine  dimercaprol  other iron products This medicine may also interact with the following medications:  chloramphenicol  deferasirox  medicine for blood pressure like enalapril This list may not describe all possible interactions. Give your health care provider a list of all the medicines, herbs,  non-prescription drugs, or dietary supplements you use. Also tell them if you smoke, drink alcohol, or use illegal drugs. Some items may interact with your medicine. What should I watch for while using this medicine? Your condition will be monitored carefully while you are receiving this medicine. Visit your doctor for check-ups as directed. What side effects may I notice from receiving this medicine? Side effects that you should report to your doctor or health care professional as soon as possible:  allergic reactions like skin rash, itching or hives, swelling of the face, lips, or tongue  breathing problems  changes in hearing  changes in vision  chills, flushing, or sweating  fast, irregular heartbeat  feeling faint or lightheaded, falls  fever, flu-like symptoms  high or low blood pressure  pain, tingling, numbness in the hands or feet  severe pain in the chest, back, flanks, or groin  swelling of the ankles, feet, hands  trouble passing urine or change in the amount of urine  unusually weak or tired Side effects that usually do not require medical attention (report to your doctor or health care professional if they continue or are bothersome):  cramps  dark colored stools  diarrhea  headache  nausea, vomiting  stomach upset This list may not describe all possible side effects. Call your doctor for medical advice about side effects. You may report side effects to FDA at 1-800-FDA-1088. Where should I keep my medicine? This drug is given in a hospital or clinic and will not be stored at home. NOTE: This sheet is a summary. It may not cover all  possible information. If you have questions about this medicine, talk to your doctor, pharmacist, or health care provider.  2020 Elsevier/Gold Standard (2008-06-19 15:58:57)

## 2020-05-20 NOTE — Telephone Encounter (Signed)
Looks like Kim did the refill for 30d earlier this month. Upon review of chart, she had been referred to hematology for eval/tx of her DVT, and remains under their care, also for iron deficiency anemia.   Seems that Dr. Irene Limbo would be the appropriate person to continue to refill her meds, especially since I didn't see a length of treatment recommended in his notes (likely will be long-term, but not mentioned). He just saw her earlier this month. Not sure if you can forward to him, vs advise pt to request RF from Dr. Irene Limbo

## 2020-05-20 NOTE — Telephone Encounter (Signed)
Received request for eliquis for optumrx. I see it was only sent in for 30 days recently. Pt has appt in September. Ok to refill for 90 days to mail order

## 2020-05-21 NOTE — Telephone Encounter (Signed)
Pt was advised to contact Dr. Irene Limbo for a refill. Not sure how to route to refill person over at that office as I do not want to send straight to Dr. Irene Limbo

## 2020-05-23 ENCOUNTER — Encounter: Payer: Self-pay | Admitting: Hematology

## 2020-05-27 ENCOUNTER — Inpatient Hospital Stay: Payer: 59

## 2020-05-27 ENCOUNTER — Other Ambulatory Visit: Payer: Self-pay

## 2020-05-27 VITALS — BP 119/65 | HR 81 | Temp 98.2°F | Resp 18

## 2020-05-27 DIAGNOSIS — D500 Iron deficiency anemia secondary to blood loss (chronic): Secondary | ICD-10-CM

## 2020-05-27 DIAGNOSIS — Z86718 Personal history of other venous thrombosis and embolism: Secondary | ICD-10-CM | POA: Diagnosis not present

## 2020-05-27 MED ORDER — METHYLPREDNISOLONE SODIUM SUCC 125 MG IJ SOLR
INTRAMUSCULAR | Status: AC
  Filled 2020-05-27: qty 2

## 2020-05-27 MED ORDER — METHYLPREDNISOLONE SODIUM SUCC 125 MG IJ SOLR
125.0000 mg | Freq: Once | INTRAMUSCULAR | Status: AC
  Administered 2020-05-27: 125 mg via INTRAVENOUS

## 2020-05-27 MED ORDER — ACETAMINOPHEN 325 MG PO TABS
650.0000 mg | ORAL_TABLET | Freq: Once | ORAL | Status: AC
  Administered 2020-05-27: 650 mg via ORAL

## 2020-05-27 MED ORDER — DIPHENHYDRAMINE HCL 50 MG/ML IJ SOLN
50.0000 mg | Freq: Once | INTRAMUSCULAR | Status: AC
  Administered 2020-05-27: 50 mg via INTRAVENOUS

## 2020-05-27 MED ORDER — ACETAMINOPHEN 325 MG PO TABS
ORAL_TABLET | ORAL | Status: AC
  Filled 2020-05-27: qty 2

## 2020-05-27 MED ORDER — FAMOTIDINE IN NACL 20-0.9 MG/50ML-% IV SOLN
20.0000 mg | Freq: Once | INTRAVENOUS | Status: AC
  Administered 2020-05-27: 20 mg via INTRAVENOUS

## 2020-05-27 MED ORDER — FAMOTIDINE IN NACL 20-0.9 MG/50ML-% IV SOLN
INTRAVENOUS | Status: AC
  Filled 2020-05-27: qty 50

## 2020-05-27 MED ORDER — DIPHENHYDRAMINE HCL 50 MG/ML IJ SOLN
INTRAMUSCULAR | Status: AC
  Filled 2020-05-27: qty 1

## 2020-05-27 MED ORDER — SODIUM CHLORIDE 0.9 % IV SOLN
125.0000 mg | Freq: Once | INTRAVENOUS | Status: AC
  Administered 2020-05-27: 125 mg via INTRAVENOUS
  Filled 2020-05-27: qty 10

## 2020-05-27 MED ORDER — SODIUM CHLORIDE 0.9 % IV SOLN
Freq: Once | INTRAVENOUS | Status: AC
  Filled 2020-05-27: qty 250

## 2020-05-27 NOTE — Patient Instructions (Signed)
Sodium Ferric Gluconate Complex injection What is this medicine? SODIUM FERRIC GLUCONATE COMPLEX (SOE dee um FER ik GLOO koe nate KOM pleks) is an iron replacement. It is used with epoetin therapy to treat low iron levels in patients who are receiving hemodialysis. This medicine may be used for other purposes; ask your health care provider or pharmacist if you have questions. COMMON BRAND NAME(S): Ferrlecit, Nulecit What should I tell my health care provider before I take this medicine? They need to know if you have any of the following conditions:  anemia that is not from iron deficiency  high levels of iron in the body  an unusual or allergic reaction to iron, benzyl alcohol, other medicines, foods, dyes, or preservatives  pregnant or are trying to become pregnant  breast-feeding How should I use this medicine? This medicine is for infusion into a vein. It is given by a health care professional in a hospital or clinic setting. Talk to your pediatrician regarding the use of this medicine in children. While this drug may be prescribed for children as young as 64 years old for selected conditions, precautions do apply. Overdosage: If you think you have taken too much of this medicine contact a poison control center or emergency room at once. NOTE: This medicine is only for you. Do not share this medicine with others. What if I miss a dose? It is important not to miss your dose. Call your doctor or health care professional if you are unable to keep an appointment. What may interact with this medicine? Do not take this medicine with any of the following medications:  deferoxamine  dimercaprol  other iron products This medicine may also interact with the following medications:  chloramphenicol  deferasirox  medicine for blood pressure like enalapril This list may not describe all possible interactions. Give your health care provider a list of all the medicines, herbs,  non-prescription drugs, or dietary supplements you use. Also tell them if you smoke, drink alcohol, or use illegal drugs. Some items may interact with your medicine. What should I watch for while using this medicine? Your condition will be monitored carefully while you are receiving this medicine. Visit your doctor for check-ups as directed. What side effects may I notice from receiving this medicine? Side effects that you should report to your doctor or health care professional as soon as possible:  allergic reactions like skin rash, itching or hives, swelling of the face, lips, or tongue  breathing problems  changes in hearing  changes in vision  chills, flushing, or sweating  fast, irregular heartbeat  feeling faint or lightheaded, falls  fever, flu-like symptoms  high or low blood pressure  pain, tingling, numbness in the hands or feet  severe pain in the chest, back, flanks, or groin  swelling of the ankles, feet, hands  trouble passing urine or change in the amount of urine  unusually weak or tired Side effects that usually do not require medical attention (report to your doctor or health care professional if they continue or are bothersome):  cramps  dark colored stools  diarrhea  headache  nausea, vomiting  stomach upset This list may not describe all possible side effects. Call your doctor for medical advice about side effects. You may report side effects to FDA at 1-800-FDA-1088. Where should I keep my medicine? This drug is given in a hospital or clinic and will not be stored at home. NOTE: This sheet is a summary. It may not cover all  possible information. If you have questions about this medicine, talk to your doctor, pharmacist, or health care provider.  2020 Elsevier/Gold Standard (2008-06-19 15:58:57)

## 2020-06-03 ENCOUNTER — Inpatient Hospital Stay: Payer: 59 | Attending: Hematology

## 2020-06-03 ENCOUNTER — Other Ambulatory Visit: Payer: Self-pay

## 2020-06-03 VITALS — BP 112/56 | HR 88 | Temp 98.4°F | Resp 16

## 2020-06-03 DIAGNOSIS — Z800 Family history of malignant neoplasm of digestive organs: Secondary | ICD-10-CM | POA: Insufficient documentation

## 2020-06-03 DIAGNOSIS — Z7901 Long term (current) use of anticoagulants: Secondary | ICD-10-CM | POA: Diagnosis not present

## 2020-06-03 DIAGNOSIS — Z6841 Body Mass Index (BMI) 40.0 and over, adult: Secondary | ICD-10-CM | POA: Diagnosis not present

## 2020-06-03 DIAGNOSIS — D500 Iron deficiency anemia secondary to blood loss (chronic): Secondary | ICD-10-CM

## 2020-06-03 DIAGNOSIS — R509 Fever, unspecified: Secondary | ICD-10-CM | POA: Insufficient documentation

## 2020-06-03 DIAGNOSIS — Z87891 Personal history of nicotine dependence: Secondary | ICD-10-CM | POA: Diagnosis not present

## 2020-06-03 DIAGNOSIS — Z9049 Acquired absence of other specified parts of digestive tract: Secondary | ICD-10-CM | POA: Diagnosis not present

## 2020-06-03 DIAGNOSIS — Z818 Family history of other mental and behavioral disorders: Secondary | ICD-10-CM | POA: Insufficient documentation

## 2020-06-03 DIAGNOSIS — Z886 Allergy status to analgesic agent status: Secondary | ICD-10-CM | POA: Insufficient documentation

## 2020-06-03 DIAGNOSIS — Z79899 Other long term (current) drug therapy: Secondary | ICD-10-CM | POA: Diagnosis not present

## 2020-06-03 DIAGNOSIS — R0781 Pleurodynia: Secondary | ICD-10-CM | POA: Insufficient documentation

## 2020-06-03 DIAGNOSIS — Z885 Allergy status to narcotic agent status: Secondary | ICD-10-CM | POA: Insufficient documentation

## 2020-06-03 DIAGNOSIS — Z83438 Family history of other disorder of lipoprotein metabolism and other lipidemia: Secondary | ICD-10-CM | POA: Diagnosis not present

## 2020-06-03 DIAGNOSIS — Z811 Family history of alcohol abuse and dependence: Secondary | ICD-10-CM | POA: Insufficient documentation

## 2020-06-03 DIAGNOSIS — Z793 Long term (current) use of hormonal contraceptives: Secondary | ICD-10-CM | POA: Insufficient documentation

## 2020-06-03 DIAGNOSIS — Z86718 Personal history of other venous thrombosis and embolism: Secondary | ICD-10-CM | POA: Insufficient documentation

## 2020-06-03 DIAGNOSIS — Z8371 Family history of colonic polyps: Secondary | ICD-10-CM | POA: Diagnosis not present

## 2020-06-03 DIAGNOSIS — Z888 Allergy status to other drugs, medicaments and biological substances status: Secondary | ICD-10-CM | POA: Insufficient documentation

## 2020-06-03 DIAGNOSIS — Z86711 Personal history of pulmonary embolism: Secondary | ICD-10-CM | POA: Insufficient documentation

## 2020-06-03 DIAGNOSIS — Z801 Family history of malignant neoplasm of trachea, bronchus and lung: Secondary | ICD-10-CM | POA: Insufficient documentation

## 2020-06-03 DIAGNOSIS — G35 Multiple sclerosis: Secondary | ICD-10-CM | POA: Diagnosis not present

## 2020-06-03 DIAGNOSIS — Z803 Family history of malignant neoplasm of breast: Secondary | ICD-10-CM | POA: Insufficient documentation

## 2020-06-03 DIAGNOSIS — Z833 Family history of diabetes mellitus: Secondary | ICD-10-CM | POA: Insufficient documentation

## 2020-06-03 DIAGNOSIS — D509 Iron deficiency anemia, unspecified: Secondary | ICD-10-CM | POA: Diagnosis not present

## 2020-06-03 DIAGNOSIS — Z823 Family history of stroke: Secondary | ICD-10-CM | POA: Insufficient documentation

## 2020-06-03 DIAGNOSIS — Z87440 Personal history of urinary (tract) infections: Secondary | ICD-10-CM | POA: Insufficient documentation

## 2020-06-03 MED ORDER — SODIUM CHLORIDE 0.9 % IV SOLN
125.0000 mg | Freq: Once | INTRAVENOUS | Status: AC
  Administered 2020-06-03: 125 mg via INTRAVENOUS
  Filled 2020-06-03: qty 10

## 2020-06-03 MED ORDER — DIPHENHYDRAMINE HCL 50 MG/ML IJ SOLN
INTRAMUSCULAR | Status: AC
  Filled 2020-06-03: qty 1

## 2020-06-03 MED ORDER — FAMOTIDINE IN NACL 20-0.9 MG/50ML-% IV SOLN
INTRAVENOUS | Status: AC
  Filled 2020-06-03: qty 50

## 2020-06-03 MED ORDER — ACETAMINOPHEN 325 MG PO TABS
ORAL_TABLET | ORAL | Status: AC
  Filled 2020-06-03: qty 2

## 2020-06-03 MED ORDER — METHYLPREDNISOLONE SODIUM SUCC 125 MG IJ SOLR
INTRAMUSCULAR | Status: AC
  Filled 2020-06-03: qty 2

## 2020-06-03 MED ORDER — ACETAMINOPHEN 325 MG PO TABS
650.0000 mg | ORAL_TABLET | Freq: Once | ORAL | Status: AC
  Administered 2020-06-03: 650 mg via ORAL

## 2020-06-03 MED ORDER — SODIUM CHLORIDE 0.9 % IV SOLN
Freq: Once | INTRAVENOUS | Status: AC
  Filled 2020-06-03: qty 250

## 2020-06-03 MED ORDER — METHYLPREDNISOLONE SODIUM SUCC 125 MG IJ SOLR
125.0000 mg | Freq: Once | INTRAMUSCULAR | Status: AC
  Administered 2020-06-03: 125 mg via INTRAVENOUS

## 2020-06-03 MED ORDER — FAMOTIDINE IN NACL 20-0.9 MG/50ML-% IV SOLN
20.0000 mg | Freq: Once | INTRAVENOUS | Status: AC
  Administered 2020-06-03: 20 mg via INTRAVENOUS

## 2020-06-03 MED ORDER — DIPHENHYDRAMINE HCL 50 MG/ML IJ SOLN
50.0000 mg | Freq: Once | INTRAMUSCULAR | Status: AC
  Administered 2020-06-03: 50 mg via INTRAVENOUS

## 2020-06-03 NOTE — Patient Instructions (Signed)
Sodium Ferric Gluconate Complex injection What is this medicine? SODIUM FERRIC GLUCONATE COMPLEX (SOE dee um FER ik GLOO koe nate KOM pleks) is an iron replacement. It is used with epoetin therapy to treat low iron levels in patients who are receiving hemodialysis. This medicine may be used for other purposes; ask your health care provider or pharmacist if you have questions. COMMON BRAND NAME(S): Ferrlecit, Nulecit What should I tell my health care provider before I take this medicine? They need to know if you have any of the following conditions:  anemia that is not from iron deficiency  high levels of iron in the body  an unusual or allergic reaction to iron, benzyl alcohol, other medicines, foods, dyes, or preservatives  pregnant or are trying to become pregnant  breast-feeding How should I use this medicine? This medicine is for infusion into a vein. It is given by a health care professional in a hospital or clinic setting. Talk to your pediatrician regarding the use of this medicine in children. While this drug may be prescribed for children as young as 87 years old for selected conditions, precautions do apply. Overdosage: If you think you have taken too much of this medicine contact a poison control center or emergency room at once. NOTE: This medicine is only for you. Do not share this medicine with others. What if I miss a dose? It is important not to miss your dose. Call your doctor or health care professional if you are unable to keep an appointment. What may interact with this medicine? Do not take this medicine with any of the following medications:  deferoxamine  dimercaprol  other iron products This medicine may also interact with the following medications:  chloramphenicol  deferasirox  medicine for blood pressure like enalapril This list may not describe all possible interactions. Give your health care provider a list of all the medicines, herbs,  non-prescription drugs, or dietary supplements you use. Also tell them if you smoke, drink alcohol, or use illegal drugs. Some items may interact with your medicine. What should I watch for while using this medicine? Your condition will be monitored carefully while you are receiving this medicine. Visit your doctor for check-ups as directed. What side effects may I notice from receiving this medicine? Side effects that you should report to your doctor or health care professional as soon as possible:  allergic reactions like skin rash, itching or hives, swelling of the face, lips, or tongue  breathing problems  changes in hearing  changes in vision  chills, flushing, or sweating  fast, irregular heartbeat  feeling faint or lightheaded, falls  fever, flu-like symptoms  high or low blood pressure  pain, tingling, numbness in the hands or feet  severe pain in the chest, back, flanks, or groin  swelling of the ankles, feet, hands  trouble passing urine or change in the amount of urine  unusually weak or tired Side effects that usually do not require medical attention (report to your doctor or health care professional if they continue or are bothersome):  cramps  dark colored stools  diarrhea  headache  nausea, vomiting  stomach upset This list may not describe all possible side effects. Call your doctor for medical advice about side effects. You may report side effects to FDA at 1-800-FDA-1088. Where should I keep my medicine? This drug is given in a hospital or clinic and will not be stored at home. NOTE: This sheet is a summary. It may not cover all  possible information. If you have questions about this medicine, talk to your doctor, pharmacist, or health care provider.  2020 Elsevier/Gold Standard (2008-06-19 15:58:57)

## 2020-06-10 ENCOUNTER — Inpatient Hospital Stay: Payer: 59

## 2020-06-10 ENCOUNTER — Other Ambulatory Visit: Payer: Self-pay

## 2020-06-10 VITALS — BP 116/68 | HR 86 | Temp 98.0°F | Resp 18

## 2020-06-10 DIAGNOSIS — D500 Iron deficiency anemia secondary to blood loss (chronic): Secondary | ICD-10-CM

## 2020-06-10 DIAGNOSIS — Z86718 Personal history of other venous thrombosis and embolism: Secondary | ICD-10-CM | POA: Diagnosis not present

## 2020-06-10 MED ORDER — SODIUM CHLORIDE 0.9 % IV SOLN
125.0000 mg | Freq: Once | INTRAVENOUS | Status: AC
  Administered 2020-06-10: 125 mg via INTRAVENOUS
  Filled 2020-06-10: qty 10

## 2020-06-10 MED ORDER — FAMOTIDINE IN NACL 20-0.9 MG/50ML-% IV SOLN
INTRAVENOUS | Status: AC
  Filled 2020-06-10: qty 50

## 2020-06-10 MED ORDER — DIPHENHYDRAMINE HCL 50 MG/ML IJ SOLN
50.0000 mg | Freq: Once | INTRAMUSCULAR | Status: AC
  Administered 2020-06-10: 50 mg via INTRAVENOUS

## 2020-06-10 MED ORDER — DIPHENHYDRAMINE HCL 50 MG/ML IJ SOLN
INTRAMUSCULAR | Status: AC
  Filled 2020-06-10: qty 1

## 2020-06-10 MED ORDER — ACETAMINOPHEN 325 MG PO TABS
650.0000 mg | ORAL_TABLET | Freq: Once | ORAL | Status: AC
  Administered 2020-06-10: 650 mg via ORAL

## 2020-06-10 MED ORDER — METHYLPREDNISOLONE SODIUM SUCC 125 MG IJ SOLR
125.0000 mg | Freq: Once | INTRAMUSCULAR | Status: AC
  Administered 2020-06-10: 125 mg via INTRAVENOUS

## 2020-06-10 MED ORDER — ACETAMINOPHEN 325 MG PO TABS
ORAL_TABLET | ORAL | Status: AC
  Filled 2020-06-10: qty 2

## 2020-06-10 MED ORDER — FAMOTIDINE IN NACL 20-0.9 MG/50ML-% IV SOLN
20.0000 mg | Freq: Once | INTRAVENOUS | Status: AC
  Administered 2020-06-10: 20 mg via INTRAVENOUS

## 2020-06-10 MED ORDER — METHYLPREDNISOLONE SODIUM SUCC 125 MG IJ SOLR
INTRAMUSCULAR | Status: AC
  Filled 2020-06-10: qty 2

## 2020-06-10 MED ORDER — SODIUM CHLORIDE 0.9 % IV SOLN
Freq: Once | INTRAVENOUS | Status: AC
  Filled 2020-06-10: qty 250

## 2020-06-10 NOTE — Patient Instructions (Signed)
Sodium Ferric Gluconate Complex injection What is this medicine? SODIUM FERRIC GLUCONATE COMPLEX (SOE dee um FER ik GLOO koe nate KOM pleks) is an iron replacement. It is used with epoetin therapy to treat low iron levels in patients who are receiving hemodialysis. This medicine may be used for other purposes; ask your health care provider or pharmacist if you have questions. COMMON BRAND NAME(S): Ferrlecit, Nulecit What should I tell my health care provider before I take this medicine? They need to know if you have any of the following conditions:  anemia that is not from iron deficiency  high levels of iron in the body  an unusual or allergic reaction to iron, benzyl alcohol, other medicines, foods, dyes, or preservatives  pregnant or are trying to become pregnant  breast-feeding How should I use this medicine? This medicine is for infusion into a vein. It is given by a health care professional in a hospital or clinic setting. Talk to your pediatrician regarding the use of this medicine in children. While this drug may be prescribed for children as young as 52 years old for selected conditions, precautions do apply. Overdosage: If you think you have taken too much of this medicine contact a poison control center or emergency room at once. NOTE: This medicine is only for you. Do not share this medicine with others. What if I miss a dose? It is important not to miss your dose. Call your doctor or health care professional if you are unable to keep an appointment. What may interact with this medicine? Do not take this medicine with any of the following medications:  deferoxamine  dimercaprol  other iron products This medicine may also interact with the following medications:  chloramphenicol  deferasirox  medicine for blood pressure like enalapril This list may not describe all possible interactions. Give your health care provider a list of all the medicines, herbs,  non-prescription drugs, or dietary supplements you use. Also tell them if you smoke, drink alcohol, or use illegal drugs. Some items may interact with your medicine. What should I watch for while using this medicine? Your condition will be monitored carefully while you are receiving this medicine. Visit your doctor for check-ups as directed. What side effects may I notice from receiving this medicine? Side effects that you should report to your doctor or health care professional as soon as possible:  allergic reactions like skin rash, itching or hives, swelling of the face, lips, or tongue  breathing problems  changes in hearing  changes in vision  chills, flushing, or sweating  fast, irregular heartbeat  feeling faint or lightheaded, falls  fever, flu-like symptoms  high or low blood pressure  pain, tingling, numbness in the hands or feet  severe pain in the chest, back, flanks, or groin  swelling of the ankles, feet, hands  trouble passing urine or change in the amount of urine  unusually weak or tired Side effects that usually do not require medical attention (report to your doctor or health care professional if they continue or are bothersome):  cramps  dark colored stools  diarrhea  headache  nausea, vomiting  stomach upset This list may not describe all possible side effects. Call your doctor for medical advice about side effects. You may report side effects to FDA at 1-800-FDA-1088. Where should I keep my medicine? This drug is given in a hospital or clinic and will not be stored at home. NOTE: This sheet is a summary. It may not cover all  possible information. If you have questions about this medicine, talk to your doctor, pharmacist, or health care provider.  2020 Elsevier/Gold Standard (2008-06-19 15:58:57)

## 2020-06-20 ENCOUNTER — Other Ambulatory Visit: Payer: 59

## 2020-06-23 ENCOUNTER — Encounter: Payer: 59 | Admitting: Family Medicine

## 2020-06-30 ENCOUNTER — Other Ambulatory Visit: Payer: Self-pay

## 2020-06-30 ENCOUNTER — Other Ambulatory Visit: Payer: 59

## 2020-06-30 DIAGNOSIS — E781 Pure hyperglyceridemia: Secondary | ICD-10-CM

## 2020-06-30 DIAGNOSIS — E559 Vitamin D deficiency, unspecified: Secondary | ICD-10-CM

## 2020-07-01 LAB — LIPID PANEL
Chol/HDL Ratio: 2.3 ratio (ref 0.0–4.4)
Cholesterol, Total: 150 mg/dL (ref 100–199)
HDL: 65 mg/dL (ref >39)
LDL Chol Calc (NIH): 56 mg/dL (ref 0–99)
Triglycerides: 174 mg/dL — ABNORMAL HIGH (ref 0–149)
VLDL Cholesterol Cal: 29 mg/dL (ref 5–40)

## 2020-07-01 LAB — VITAMIN D 25 HYDROXY (VIT D DEFICIENCY, FRACTURES): Vit D, 25-Hydroxy: 46.2 ng/mL (ref 30.0–100.0)

## 2020-07-02 NOTE — Progress Notes (Signed)
Start time: 12:06 End time: 12:43  Virtual Visit via Video Note  I connected with Lindsay Mccarthy on 07/03/20 by a video enabled telemedicine application and verified that I am speaking with the correct person using two identifiers.  Location: Patient: home Provider: office   I discussed the limitations of evaluation and management by telemedicine and the availability of in person appointments. The patient expressed understanding and agreed to proceed.  History of Present Illness:  Chief Complaint  Patient presents with  . Med Check    VIRTUAL med check, labs already done. No concerns.    Patient presents for 6 month med check. See below for labs done prior to her visit.  She has h/oDVT and PE, remains onEliquiswithout complication. She denies bleeding or bruising. Denies shortness of breath, chest pain or leg swelling.  Pt is under the care of Lindsay Mccarthy. She has iron deficiency anemia and elevated plt (felt to be reactive). Got iron infusions weekly x 4. Hasn't had CBC rechecked yet.  Prior to treatments: Lab Results  Component Value Date   WBC 8.5 05/07/2020   HGB 10.3 (L) 05/07/2020   HCT 33.3 (L) 05/07/2020   MCV 84.1 05/07/2020   PLT 421 (H) 05/07/2020  Tolerated the iron, doesn't really notice much of a difference. Fatigue persists.  Hypertriglyceridemia: She is compliant with medications(fenofibrate)and denies side effects.TG's have remained above goal.  She has seen nutritionist, and was given info on healthier snacks. She previously took omega-3 fish oil, which were stopped when anticoagulants were started.  She reports today that she restarted them 1.5 months ago (2 at night and 1 in the morning), because she couldn't remember why they were stopped.  No longer on [employer], but they "cleaned up their eating" and eating healthier. She is trying to watch her carbs.  Depression follow-up:She remains onLexapro79m daily andWellbutrin SR1078mBID. She is compliant  with her meds, and didn't tolerate the higher dose of wellbutrin in the past. She reports "I'm doing great".  The pharmacy switched generic for buproprion, and thinks it is working even better. Feels "back to my old self" and more optimistic.  Vitamin D deficiency--found to be low at 21 in 09/2018, checked by Dr. JaTomi LikensLast level was 29.4 in 12/2019.  She is currently taking 5000 IU twice daily since February. She reports she d/w neurologist, who wants level at the higher end of range.  MS--last saw Dr. JaTomi Likensn 04/2020 (inadvertantly hadn't been taking Ampyra at that time, restarted it). The Ampyra helps a lot with her fatigue, legs are less weighed down, but no particular benefit to her walking. She continues on Ocrevus, Ampyra as well as baclofen. She last had MRI's in brain and spine in June and July of 2021, she reports no significant change. Incontinence has improved, which seems to be related to controlling triggers (stress, change in routine, exhaustion). She has had only 1 accident in the last 6 months. Gabapentin 30029mas stopped--cut back to 200m55mhen tapered off (was taking for RLS, electric shock in legs at night).  She is doing well overall, uses "edible" at night to get sleep.  Rare electric shock sensation during the day. Leg swelling improved some when she restarted Ampyra, thinks is a side effect of Ocrevus (didn't resolve with stopping the gabapentin). Hasn't been wearing compression socks due to the heat.  Elevates feet when she can.  Crohn's Disease:  Under the care of Dr. GessCarlean Purlst seen in 12/2019.  She is doing  well on Entyvia. She uses colestipol infrequently for diarrhea (doesn't like how she feels, mainly uses if traveling or when bathrooms won't be convenient; didn't need with recent trip).   Pulmonary nodule noted on CT 07/2018.  Had CT follow-up in October and pulmonary nodule was unchanged.  1 year f/u was recommended to assess stability. She denies any cough,  shortness of breath. Last CT was 08/2019: IMPRESSION: Since the prior examination there has been no significant change in the left lower lobe tubular nodule which measures 6.5 mm in greatest diameter. Recommend additional 12 month follow-up confirm stability.   PMH, PSH, SH reviewed  Immunization History  Administered Date(s) Administered  . Hepatitis A 06/29/2012, 01/04/2013  . Influenza Split 08/09/2012  . Influenza,inj,Quad PF,6+ Mos 09/18/2013, 10/07/2014, 12/15/2015, 08/02/2016, 10/05/2017, 07/18/2019  . PFIZER SARS-COV-2 Vaccination 05/24/2020, 06/14/2020  . PPD Test 04/30/2013, 10/07/2014  . Pneumococcal Conjugate-13 04/12/2018  . Pneumococcal Polysaccharide-23 07/06/2012  . Tdap 12/15/2015   She states that she is not planning to get the [place] booster, due to side effects. She had 103 fever x 3d, vomiting, diarrhea, "flu-like" illness.  Outpatient Encounter Medications as of 07/03/2020  Medication Sig Note  . baclofen (LIORESAL) 10 MG tablet TAKE 1 TABLET(10 MG) BY MOUTH THREE TIMES DAILY AS NEEDED FOR MUSCLE SPASMS 07/03/2020: Takes 1-2 daily  . buPROPion (WELLBUTRIN SR) 100 MG 12 hr tablet Take 1 tablet (100 mg total) by mouth 2 (two) times daily.   . cetirizine (ZYRTEC) 10 MG tablet Take 10 mg by mouth at bedtime.    . CHOLECALCIFEROL PO Take 5,000 Units by mouth 2 (two) times daily.   . cyanocobalamin (,VITAMIN B-12,) 1000 MCG/ML injection INJECT 1 ML IN THE MUSCLE WEEKLY FOR 4 WEEKS, THEN 1 ML MONTHLY THEREAFTER   . dalfampridine 10 MG TB12 Take by mouth 2 (two) times daily.    Marland Kitchen ELIQUIS 5 MG TABS tablet TAKE 1 TABLET BY MOUTH  TWICE DAILY   . escitalopram (LEXAPRO) 10 MG tablet TAKE 1 TABLET(10 MG) BY MOUTH AT BEDTIME   . fenofibrate 160 MG tablet TAKE 1 TABLET BY MOUTH AT  BEDTIME   . levonorgestrel (MIRENA) 20 MCG/24HR IUD 1 each by Intrauterine route once. Implanted April 2017   . NON FORMULARY Take 1 each by mouth at bedtime. 07/03/2020: CBD and CANNABIS  .  ocrelizumab 600 mg in sodium chloride 0.9 % 500 mL Inject 600 mg into the vein every 6 (six) months. 07/18/2019: Ocrevus Infusion q 6 months  . Omega-3 Fatty Acids (FISH OIL) 1200 MG CPDR Take 3,600 mg by mouth daily.   Marland Kitchen OVER THE COUNTER MEDICATION Apply 1 application topically daily as needed (arthritis pain). CBD Oil   . vedolizumab (ENTYVIO) 300 MG injection Inject 300 mg into the vein every 8 (eight) weeks. 05/07/2020: Entyvio infusion every 8 weeks  . [DISCONTINUED] Biotin 10000 MCG TABS Take by mouth.    . [DISCONTINUED] gabapentin (NEURONTIN) 100 MG capsule TAKE 3 CAPSULES(300 MG) BY MOUTH AT BEDTIME   . acetaminophen (TYLENOL) 500 MG tablet Take 1,000 mg by mouth every 6 (six) hours as needed for headache (pain). Reported on 12/15/2015 (Patient not taking: Reported on 07/03/2020)   . colestipol (COLESTID) 5 g packet TAKE 5 GRAMS BY MOUTH DAILY (Patient not taking: Reported on 07/03/2020) 05/07/2020: Takes as needed  . diphenhydrAMINE (BENADRYL) 25 MG tablet Take 25 mg by mouth at bedtime as needed for sleep.  (Patient not taking: Reported on 07/03/2020)    No facility-administered encounter medications  on file as of 07/03/2020.   Allergies  Allergen Reactions  . Iron Other (See Comments)    Blood in stool/Rectal Bleeding  . Venofer [Iron Sucrose] Other (See Comments)    Fever, infusion reaction   . Ibuprofen Other (See Comments)    Avoids due to Crohns disease  . Morphine And Related Itching    itchy  . Prednisone Other (See Comments)    "crazy", hallucinations Tolerates methylprednisolone  . Adhesive [Tape] Rash    Rash with electrodes    ROS: no fever, chills, headaches, dizziness, chest pain, palpitations, shortness of breath.  No nausea, vomiting, reflux, bowel changes, bleeding, bruising or other concerns. Rare incontinence. +edema per HPI. See HPI   Observations/Objective:  BP 120/75 Comment: WL  Ht 5' 4"  (1.626 m)   Wt 228 lb (103.4 kg)   BMI 39.14 kg/m  Wt Readings from Last  3 Encounters:  07/03/20 228 lb (103.4 kg)  05/15/20 235 lb (106.6 kg)  05/07/20 234 lb 12.8 oz (106.5 kg)   Pleasant, well-appearing female, in no distress. She is alert, oriented, cranial nerves grossly intact. She has normal speech, eye contact. Normal mood, affect, grooming. Exam is limited due to virtual nature of the visit.  Vitamin D-OH level of 46.2 Lab Results  Component Value Date   CHOL 150 06/30/2020   HDL 65 06/30/2020   LDLCALC 56 06/30/2020   TRIG 174 (H) 06/30/2020   CHOLHDL 2.3 06/30/2020    Assessment and Plan:  Vitamin D deficiency - improved. taking 5000 IU BID, which is on the high side for chronic use. Rec recheck in 3-4 mos (pt states neuro will do at f/u in December)  Pulmonary nodule - stable. Due for 12 month recheck in October.   - Plan: CT Chest Wo Contrast  Multiple sclerosis (HCC) - stable, under care of neuro  Crohn's disease of small intestine without complication ([place]) - stable, under care of GI  Anticoagulant long-term use - due to h/o PE, DVT.  Tolerating without complication  Hypertriglyceridemia - TG improved, sl elevated. Continue fenofibrate, lowfat diet. Stop fish oil--not recommended due to increased risk of bleeding on anticoagulants  - Plan: fenofibrate 160 MG tablet  Major depression in remission ([place]) - cont current regimen  Pt states Vitamin D to be checked by Dr. Tomi Mccarthy prior to [facility]. Discussed that I recommend that D level be checked within 3-4 months to monitor the high dose, risk for toxicity (on 10,000 IU daily)   Please stop taking the fish oil due to the potential increased risk for bleeding when also on eliquis.  Please check with Lindsay Mccarthy to see if he agrees that you should remain off of the fish oil, and that prescription similar medications (Vascepa and Lovaza) should also be avoided. Continue lowfat diet and your fenofibrate.  Follow Up Instructions:    I discussed the assessment and treatment plan with  the patient. The patient was provided an opportunity to ask questions and all were answered. The patient agreed with the plan and demonstrated an understanding of the instructions.   The patient was advised to call back or seek an in-person evaluation if the symptoms worsen or if the condition fails to improve as anticipated.  I provided 37 minutes of video face-to-face time during this encounter. Additional time was spent in chart review and documentation.   Vikki Ports, MD

## 2020-07-03 ENCOUNTER — Encounter: Payer: Self-pay | Admitting: Hematology

## 2020-07-03 ENCOUNTER — Encounter: Payer: Self-pay | Admitting: Family Medicine

## 2020-07-03 ENCOUNTER — Telehealth (INDEPENDENT_AMBULATORY_CARE_PROVIDER_SITE_OTHER): Payer: 59 | Admitting: Family Medicine

## 2020-07-03 ENCOUNTER — Encounter: Payer: Self-pay | Admitting: Neurology

## 2020-07-03 VITALS — BP 120/75 | Ht 64.0 in | Wt 228.0 lb

## 2020-07-03 DIAGNOSIS — E781 Pure hyperglyceridemia: Secondary | ICD-10-CM

## 2020-07-03 DIAGNOSIS — G35 Multiple sclerosis: Secondary | ICD-10-CM

## 2020-07-03 DIAGNOSIS — E559 Vitamin D deficiency, unspecified: Secondary | ICD-10-CM

## 2020-07-03 DIAGNOSIS — R911 Solitary pulmonary nodule: Secondary | ICD-10-CM

## 2020-07-03 DIAGNOSIS — K5000 Crohn's disease of small intestine without complications: Secondary | ICD-10-CM

## 2020-07-03 DIAGNOSIS — F325 Major depressive disorder, single episode, in full remission: Secondary | ICD-10-CM

## 2020-07-03 DIAGNOSIS — Z7901 Long term (current) use of anticoagulants: Secondary | ICD-10-CM

## 2020-07-03 MED ORDER — FENOFIBRATE 160 MG PO TABS: ORAL_TABLET | ORAL | 1 refills | Status: DC

## 2020-07-03 NOTE — Patient Instructions (Addendum)
Please stop taking the fish oil due to the potential increased risk for bleeding when also on eliquis.  Please check with Dr. Irene Limbo to see if he agrees that you should remain off of the fish oil, and that prescription similar medications (Vascepa and Lovaza) should also be avoided. Continue lowfat diet and your fenofibrate.  Your CT scan to follow-up on the lung nodule is due in October.  I'm putting in the order so it can get scheduled.  Let us know in a week or two if you don't hear anything about an appointment.  Your vitamin D is much better.  Be sure that the level is rechecked (by Dr Tomi Likens?) in 3-4 months, to monitor the level, as the dose is quite high. I want to make sure that it isn't too much, or trending higher, want to avoid toxicity (high levels).

## 2020-07-03 NOTE — Progress Notes (Addendum)
[facility] Case ID: [id number] Need help? Call us at [phone] Outcome Approvedtoday Request Reference Number: [id number]. DALFAMPRIDIN TAB 10MG ER is approved through 07/03/2021. Your patient may now fill this prescription and it will be covered. Drug Dalfampridine ER 10MG er tablets Form [employer] Electronic Prior Authorization Form [phone] NCPDP)

## 2020-07-08 ENCOUNTER — Other Ambulatory Visit: Payer: Self-pay | Admitting: Hematology

## 2020-07-08 MED ORDER — CYANOCOBALAMIN 1000 MCG/ML IJ SOLN: 1000.0000 ug | INTRAMUSCULAR | 2 refills | Status: DC

## 2020-07-08 MED ORDER — "INSULIN SYRINGE 27G X 1/2"" 1 ML MISC": 1.0000 | 1 refills | Status: DC

## 2020-07-09 ENCOUNTER — Other Ambulatory Visit: Payer: Self-pay

## 2020-07-09 ENCOUNTER — Encounter: Payer: Self-pay | Admitting: Neurology

## 2020-07-09 DIAGNOSIS — G35 Multiple sclerosis: Secondary | ICD-10-CM

## 2020-07-09 NOTE — Progress Notes (Addendum)
The PA for Ms. Fornes has been approved 07/21/20-07/21/21.

## 2020-07-16 ENCOUNTER — Other Ambulatory Visit (INDEPENDENT_AMBULATORY_CARE_PROVIDER_SITE_OTHER): Payer: 59

## 2020-07-16 ENCOUNTER — Ambulatory Visit (INDEPENDENT_AMBULATORY_CARE_PROVIDER_SITE_OTHER): Payer: 59 | Admitting: Physician Assistant

## 2020-07-16 ENCOUNTER — Other Ambulatory Visit: Payer: Self-pay

## 2020-07-16 ENCOUNTER — Encounter: Payer: Self-pay | Admitting: Physician Assistant

## 2020-07-16 VITALS — BP 110/72 | HR 88 | Ht 64.0 in | Wt 231.4 lb

## 2020-07-16 DIAGNOSIS — K50012 Crohn's disease of small intestine with intestinal obstruction: Secondary | ICD-10-CM | POA: Diagnosis not present

## 2020-07-16 DIAGNOSIS — G35 Multiple sclerosis: Secondary | ICD-10-CM

## 2020-07-16 LAB — CBC WITH DIFFERENTIAL/PLATELET
Basophils Absolute: 0 10*3/uL (ref 0.0–0.1)
Basophils Relative: 0.4 % (ref 0.0–3.0)
Eosinophils Absolute: 0.2 10*3/uL (ref 0.0–0.7)
Eosinophils Relative: 1.5 % (ref 0.0–5.0)
HCT: 34.4 % — ABNORMAL LOW (ref 36.0–46.0)
Hemoglobin: 10.8 g/dL — ABNORMAL LOW (ref 12.0–15.0)
Lymphocytes Relative: 13.7 % (ref 12.0–46.0)
Lymphs Abs: 1.4 10*3/uL (ref 0.7–4.0)
MCHC: 31.4 g/dL (ref 30.0–36.0)
MCV: 78.2 fl (ref 78.0–100.0)
Monocytes Absolute: 0.9 10*3/uL (ref 0.1–1.0)
Monocytes Relative: 9.1 % (ref 3.0–12.0)
Neutro Abs: 7.7 10*3/uL (ref 1.4–7.7)
Neutrophils Relative %: 75.3 % (ref 43.0–77.0)
Platelets: 491 10*3/uL — ABNORMAL HIGH (ref 150.0–400.0)
RBC: 4.4 Mil/uL (ref 3.87–5.11)
RDW: 18.3 % — ABNORMAL HIGH (ref 11.5–15.5)
WBC: 10.3 10*3/uL (ref 4.0–10.5)

## 2020-07-16 LAB — COMPREHENSIVE METABOLIC PANEL
ALT: 17 U/L (ref 0–35)
AST: 15 U/L (ref 0–37)
Albumin: 4.1 g/dL (ref 3.5–5.2)
Alkaline Phosphatase: 46 U/L (ref 39–117)
BUN: 14 mg/dL (ref 6–23)
CO2: 27 mEq/L (ref 19–32)
Calcium: 8.7 mg/dL (ref 8.4–10.5)
Chloride: 103 mEq/L (ref 96–112)
Creatinine, Ser: 0.99 mg/dL (ref 0.40–1.20)
GFR: 61.36 mL/min (ref >60.00)
Glucose, Bld: 95 mg/dL (ref 70–99)
Potassium: 3.4 mEq/L — ABNORMAL LOW (ref 3.5–5.1)
Sodium: 139 mEq/L (ref 135–145)
Total Bilirubin: 0.2 mg/dL (ref 0.2–1.2)
Total Protein: 7.1 g/dL (ref 6.0–8.3)

## 2020-07-16 LAB — C-REACTIVE PROTEIN: CRP: <1 mg/dL (ref 0.5–20.0)

## 2020-07-16 LAB — SEDIMENTATION RATE: Sed Rate: 17 mm/hr (ref 0–20)

## 2020-07-16 MED ORDER — DICYCLOMINE HCL 10 MG PO CAPS: 10.0000 mg | ORAL_CAPSULE | Freq: Four times a day (QID) | ORAL | 4 refills | Status: DC | PRN

## 2020-07-16 NOTE — Progress Notes (Signed)
Subjective:    Patient ID: Lindsay Mccarthy, female    DOB: 1976/07/25, 44 y.o.   MRN: [id number]  HPI [name] is a pleasant 44 year old white female, known to Dr. Carlean Purl who comes in today for follow-up.  She has history of Crohn's ileocolitis and is status post remote resection in 2006. Last colonoscopy 2018 with patent end-to-side ileocolonic anastomosis with edema and erosions and moderate stenosis and ulceration, also noted mild erythema of the rectum.  Biopsies showed mild chronically active colitis from the anastomosis, left colon and rectum. She has been treated with Remicade remotely, Cimzia, and has been on Entyvio over the past  2-1/2 years. She feels that [place] had worked very well initially.  Over the past 6 months or so she had had some "spotty" symptoms that would usually occur in the week or 2 prior to her next infusion.  Over the past 3 to 4 months symptoms have been worse, and she is now having ongoing constant right lower quadrant abdominal pain, urgency and frequent bowel movements with up to 10 bowel movements per day.  She says a good day for her when she is doing well is usually 5 bowel movements per day.  She has not noticed any bleeding.  She also has history of bile salt induced diarrhea but does not use colestipol regularly as it makes her feel bloated and uncomfortable.  She says this feels very different than the bowel salt induced diarrhea and feels that this is secondary to exacerbation of Crohn's.  No fevers, no bleeding, has had some mild nausea no vomiting.   Her last infusion was 07/04/2020 She says she may have had a little bit of a response for the first few days, but other than that no response to this last infusion. She also has history of prior pulmonary embolus and DVT, bile salt induced diarrhea as above, MS for which she is also on biologic therapy every 6 months, depression, and history of iron deficiency anemia for which she undergoes periodic iron  infusions.  Last labs July 2021 ferritin less than 4 hemoglobin 10.3/hematocrit of 33.3 and has had iron infusion since. She is adamantly opposed to any steroid treatment, and states they make her "crazy".  Review of Systems Pertinent positive and negative review of systems were noted in the above HPI section.  All other review of systems was otherwise negative.  Outpatient Encounter Medications as of 07/16/2020  Medication Sig  . acetaminophen (TYLENOL) 500 MG tablet Take 1,000 mg by mouth every 6 (six) hours as needed for headache (pain). Reported on 12/15/2015  . baclofen (LIORESAL) 10 MG tablet TAKE 1 TABLET(10 MG) BY MOUTH THREE TIMES DAILY AS NEEDED FOR MUSCLE SPASMS  . buPROPion (WELLBUTRIN SR) 100 MG 12 hr tablet Take 1 tablet (100 mg total) by mouth 2 (two) times daily.  . cetirizine (ZYRTEC) 10 MG tablet Take 10 mg by mouth at bedtime.   . CHOLECALCIFEROL PO Take 5,000 Units by mouth 2 (two) times daily.  . colestipol (COLESTID) 5 g packet TAKE 5 GRAMS BY MOUTH DAILY  . cyanocobalamin (,VITAMIN B-12,) 1000 MCG/ML injection Inject 1 mL (1,000 mcg total) into the skin every 14 (fourteen) days.  Marland Kitchen dalfampridine 10 MG TB12 Take by mouth 2 (two) times daily.   . diphenhydrAMINE (BENADRYL) 25 MG tablet Take 25 mg by mouth at bedtime as needed for sleep.   Marland Kitchen ELIQUIS 5 MG TABS tablet TAKE 1 TABLET BY MOUTH  TWICE DAILY  . escitalopram (  LEXAPRO) 10 MG tablet TAKE 1 TABLET(10 MG) BY MOUTH AT BEDTIME  . fenofibrate 160 MG tablet TAKE 1 TABLET BY MOUTH AT  BEDTIME  . Insulin Syringe 27G X 1/2" 1 ML MISC 1 each by Does not apply route every 14 (fourteen) days. For [place] B12 injection  . levonorgestrel (MIRENA) 20 MCG/24HR IUD 1 each by Intrauterine route once. Implanted April 2017  . NON FORMULARY Take 1 each by mouth at bedtime.  Marland Kitchen ocrelizumab 600 mg in sodium chloride 0.9 % 500 mL Inject 600 mg into the vein every 6 (six) months.  . Omega-3 Fatty Acids (FISH OIL) 1200 MG CPDR Take 3,600 mg by mouth  daily.  Marland Kitchen OVER THE COUNTER MEDICATION Apply 1 application topically daily as needed (arthritis pain). CBD Oil  . vedolizumab (ENTYVIO) 300 MG injection Inject 300 mg into the vein every 8 (eight) weeks.  . dicyclomine (BENTYL) 10 MG capsule Take 1 capsule (10 mg total) by mouth every 6 (six) hours as needed for spasms (abd pain/cramping).   No facility-administered encounter medications on file as of 07/16/2020.   Allergies  Allergen Reactions  . Iron Other (See Comments)    Blood in stool/Rectal Bleeding  . Venofer [Iron Sucrose] Other (See Comments)    Fever, infusion reaction   . Ibuprofen Other (See Comments)    Avoids due to Crohns disease  . Morphine And Related Itching    itchy  . Prednisone Other (See Comments)    "crazy", hallucinations Tolerates methylprednisolone  . Adhesive [Tape] Rash    Rash with electrodes   Patient Active Problem List   Diagnosis Date Noted  . Multiple falls 08/21/2019  . Hematoma of right flank 08/21/2019  . Anticoagulant long-term use 07/17/2019  . Multiple sclerosis ([place]) 12/19/2018  . Pulmonary nodule 07/19/2018  . 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 Dvt femoral (deep venous thrombosis) ([place]) 11/30/2017  . Arm DVT (deep venous thromboembolism), acute, right ([place]) 11/29/2017  . Pulmonary embolus ([place]) 11/27/2017  . Iron deficiency anemia due to chronic blood loss 10/05/2017  . Heartburn 10/05/2017  . Other fatigue 10/05/2017  . Vitamin D deficiency 12/16/2015  . Major depressive disorder in remission ([place]) 10/03/2013  . Hypertriglyceridemia 07/03/2012  . Crohn's disease of small intestine ([place]) 06/24/2012  . Long-term use of immunosuppressant medication Entyvio + ocrelizumab 06/24/2012  . Vitamin B12 deficiency 06/19/2012  . Osteopenia of femur neck T. score -1.4 09/29/2011   Social History   Socioeconomic History  . Marital status: Married    Spouse name: Marcello Moores  . Number of children: 0  . Years of education: 36  . Highest  education level: Associate degree: occupational, Hotel manager, or vocational program  Occupational History  . 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 Smoking status: Former Smoker    Packs/day: 1.00    Years: 4.00    Pack years: 4.00    Quit date: 11/18/1998    Years since quitting: 21.6  . Smokeless tobacco: Never Used  Vaping Use  . Vaping Use: Never used  Substance and Sexual Activity  . Alcohol use: No    Alcohol/week: 0.0 standard drinks  . Drug use: No  . Sexual activity: Yes    Partners: Male    Birth control/protection: I.U.D.    Comment: Mirena inserted 01/2016  Other Topics Concern  . Not on file  Social History Narrative   The patient is divorced.     Re-married Marcello Moores, partner of 10  years, on 10/10/16. 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   Moved from [place] to [place] in 2013.   Past smoker   No alcohol   2-3 caffeinated beverages a day   She reports she is compliant with sunscreen given her increased risk of sun damage and skin cancer (no longer on azathioprine)      Updated 12/24/19   Social Determinants of Health   Financial Resource Strain:   . Difficulty of Paying Living Expenses: Not on file  Food Insecurity:   . Worried About Charity fundraiser in the Last Year: Not on file  . Ran Out of Food in the Last Year: Not on file  Transportation Needs:   . Lack of Transportation (Medical): Not on file  . Lack of Transportation (Non-Medical): Not on file  Physical Activity:   . Days of Exercise per Week: Not on file  . Minutes of Exercise per Session: Not on file  Stress:   . Feeling of Stress : Not on file  Social Connections:   . Frequency of Communication with Friends and Family: Not on file  . Frequency of Social Gatherings with Friends and Family: Not on file  . Attends Religious Services: Not on file  . Active Member of Clubs or Organizations: Not on  file  . Attends Archivist Meetings: Not on file  . Marital Status: Not on file  Intimate Partner Violence:   . Fear of Current or Ex-Partner: Not on file  . Emotionally Abused: Not on file  . Physically Abused: Not on file  . Sexually Abused: Not on file    Ms. Shannahan's family history includes Alcohol abuse in her sister; Bone cancer in her mother; Breast cancer (age of onset: 81) in her mother; Colon cancer in her paternal grandfather; Colon polyps in her father; Depression in her mother; Diabetes in her father; Fuch's dystrophy in her father and sister; Heart disease in her paternal grandmother; Hyperlipidemia in her father; Hypertension in her father, maternal grandmother, mother, paternal grandmother, and sister; Hypothyroidism in her mother; Lung cancer in her maternal grandmother; Multiple sclerosis in her sister; Stroke in her maternal grandmother.      Objective:    Vitals:   07/16/20 1537  BP: 110/72  Pulse: 88    Physical Exam Well-developed well-nourished female, in no acute distress.  Height, Weight 231 BMI 39.7 ambulates with some difficulty secondary to MS  HEENT; nontraumatic normocephalic, EOMI, PER R LA, sclera anicteric. Oropharynx; not examined Neck; supple, no JVD Cardiovascular; regular rate and rhythm with S1-S2, no murmur rub or gallop Pulmonary; Clear bilaterally Abdomen; soft, she is tender in the right lower quadrant, right mid quadrant with fullness no palpable mass, nondistended, midline incisional scar, no palpable mass or hepatosplenomegaly, bowel sounds are active Rectal; not done today Skin; benign exam, no jaundice rash or appreciable lesions Extremities; no clubbing cyanosis or edema skin warm and dry Neuro/Psych; alert and oriented x4, grossly nonfocal mood and affect appropriate       Assessment & Plan:   #39 44 year old white female with complicated Crohn's disease/ileocolonic status post remote resection. Prior treatment with  Remicade, and Cimzia. Patient has been managed with Entyvio over the past 2-1/2 years.  Has had good control of symptoms until the past 3 to 4 months with gradual progression to daily fairly constant abdominal pain cramping urgency and increase in frequency of bowel movements  now up to 10 times per day. No response/improvement in symptoms after last infusion on 07/04/2020  Symptoms consistent with Crohn's exacerbation, suspect loss of response and/or antibody development to Vedolizumab  #2 multiple sclerosis-on biologic therapy every 6 months #3 iron deficiency anemia-managed by hematology with periodic iron infusions 4.  Bile salt induced diarrhea 5.  Prior history of PE and DVT  Plan; CBC with differential, c-Met, sed rate, CRP Will obtain vedolizumab antibody level-I do not think waiting for trough level necessarily be helpful as she did not seem to have any response to this last infusion  We discussed trial of steroids which she is very much opposed to.  Advised this may offer short-term improvement, and help gain control of current exacerbation.  She declines at this time Start dicyclomine 10 mg p.o. every 6 hours as needed abdominal pain/cramping Schedule for CT scan of the abdomen pelvis with contrast. We will discuss further with Dr. Carlean Purl, further recommendations pending labs and CT.  Illana Nolting Genia Harold PA-C 07/16/2020   Cc: Rita Ohara, MD

## 2020-07-16 NOTE — Patient Instructions (Addendum)
If you are age 42 or older, your body mass index should be between 23-30. Your Body mass index is 39.72 kg/m. If this is out of the aforementioned range listed, please consider follow up with your Primary Care Provider.  If you are age 62 or younger, your body mass index should be between 19-25. Your Body mass index is 39.72 kg/m. If this is out of the aformentioned range listed, please consider follow up with your Primary Care Provider.   Your provider has requested that you go to the basement level for lab work before leaving today. Press "B" on the elevator. The lab is located at the first door on the left as you exit the elevator.  You have been scheduled for a CT scan of the abdomen and pelvis at [facility], 1st floor Radiology. You are scheduled on 07/24/20  at 8:30 am. You should arrive 15 minutes prior to your appointment time for registration.  Please pick up 2 bottles of contrast from [facility] at least 3 days prior to your scan. The solution may taste better if refrigerated, but do NOT add ice or any other liquid to this solution. Shake well before drinking.   Please follow the written instructions below on the day of your exam:   1) Do not eat anything after 4:30 am (4 hours prior to your test)   2) Drink 1 bottle of contrast @ 6:30 am (2 hours prior to your exam)  Remember to shake well before drinking and do NOT pour over ice.     Drink 1 bottle of contrast @ 7:30 am (1 hour prior to your exam)   You may take any medications as prescribed with a small amount of water, if necessary. If you take any of the following medications: METFORMIN, GLUCOPHAGE, GLUCOVANCE, AVANDAMET, RIOMET, FORTAMET, [place] MET, JANUMET, GLUMETZA or METAGLIP, you MAY be asked to HOLD this medication 48 hours AFTER the exam.   The purpose of you drinking the oral contrast is to aid in the visualization of your intestinal tract. The contrast solution may cause some diarrhea. Depending on your  individual set of symptoms, you may also receive an intravenous injection of x-ray contrast/dye. Plan on being at [facility] for 45 minutes or longer, depending on the type of exam you are having performed.   If you have any questions regarding your exam or if you need to reschedule, you may call [facility] at [phone] between the hours of 8:00 am and 5:00 pm, Monday-Friday.   Dicyclomine has been sent you pharmacy.  Follow up pending the results of you CT and labs.

## 2020-07-17 ENCOUNTER — Other Ambulatory Visit: Payer: Self-pay

## 2020-07-17 DIAGNOSIS — G35 Multiple sclerosis: Secondary | ICD-10-CM

## 2020-07-17 LAB — IGG, IGA, IGM
IgG (Immunoglobin G), Serum: 1052 mg/dL (ref 600–1640)
IgM, Serum: 36 mg/dL — ABNORMAL LOW (ref 50–300)
Immunoglobulin A: 137 mg/dL (ref 47–310)

## 2020-07-24 ENCOUNTER — Ambulatory Visit (HOSPITAL_COMMUNITY): Payer: 59

## 2020-07-30 ENCOUNTER — Telehealth: Payer: Self-pay | Admitting: Physician Assistant

## 2020-07-30 NOTE — Telephone Encounter (Signed)
[phone]  Is what's in the box, and [phone] is what is on the form.

## 2020-07-30 NOTE — Telephone Encounter (Signed)
Do you have a telephone number for the [facility] lab? Thanks

## 2020-07-31 ENCOUNTER — Other Ambulatory Visit: Payer: Self-pay | Admitting: Family Medicine

## 2020-07-31 DIAGNOSIS — Z7901 Long term (current) use of anticoagulants: Secondary | ICD-10-CM

## 2020-07-31 DIAGNOSIS — Z86711 Personal history of pulmonary embolism: Secondary | ICD-10-CM

## 2020-07-31 NOTE — Telephone Encounter (Signed)
Looks like we have been filling this. I recall prev heme-onc rx'ing.  She sees them quite regularly now for her iron treatments, but I guess it is fine for [place] to continue to fill. Just ensure with patient that she has addressed the length of need with her other doctors (assuming longterm/life-long, given the severity of her DVT/PE)  Too hard for me to find documentation of this in the chart--just call and check with her that that was the plan, then okay to fill x 6 mos.

## 2020-07-31 NOTE — Telephone Encounter (Signed)
Spoke with patient and she did confirm that heme stated that she will be on this medication indefinitely and that her PCP could take over the refilling of this medication. Sent x 6 months.

## 2020-07-31 NOTE — Telephone Encounter (Signed)
Is this okay to refill? 

## 2020-08-01 ENCOUNTER — Encounter (HOSPITAL_COMMUNITY): Payer: Self-pay

## 2020-08-01 ENCOUNTER — Other Ambulatory Visit: Payer: Self-pay | Admitting: Internal Medicine

## 2020-08-01 ENCOUNTER — Telehealth: Payer: Self-pay | Admitting: Internal Medicine

## 2020-08-01 ENCOUNTER — Ambulatory Visit (HOSPITAL_COMMUNITY)
Admission: RE | Admit: 2020-08-01 | Discharge: 2020-08-01 | Disposition: A | Discharge status: Home / Self Care | Payer: 59 | Source: Ambulatory Visit | Attending: Physician Assistant | Admitting: Physician Assistant

## 2020-08-01 ENCOUNTER — Other Ambulatory Visit: Payer: Self-pay

## 2020-08-01 DIAGNOSIS — K50012 Crohn's disease of small intestine with intestinal obstruction: Secondary | ICD-10-CM | POA: Diagnosis not present

## 2020-08-01 DIAGNOSIS — Z7960 Long term (current) use of unspecified immunomodulators and immunosuppressants: Secondary | ICD-10-CM

## 2020-08-01 MED ORDER — IOHEXOL 300 MG/ML  SOLN
100.0000 mL | Freq: Once | INTRAMUSCULAR | Status: AC | PRN
  Administered 2020-08-01: 100 mL via INTRAVENOUS

## 2020-08-01 NOTE — Telephone Encounter (Signed)
Called patient, explained that CT scan demonstrated mild inflammatory changes in the neoterminal ileum.  She reports that the other days she had a fever and since then she is back to baseline with respect to her Crohn's symptomatology with about 6 bowel movements a day.  No more nausea no abdominal pain.  It turns out that our attempts to measure vedolizumab antibodies were in proper i.e. I think the wrong test was ordered.   We will cancel that and have her do vedolizumab antibodies at trough prior to her next dose on October 28 and also check a QuantiFERON test in her.  She will notify me if things worsen in the interim.

## 2020-08-04 NOTE — Addendum Note (Signed)
Addended by: Aleatha Borer on: 08/04/2020 01:36 PM   Modules accepted: Orders

## 2020-08-07 ENCOUNTER — Inpatient Hospital Stay: Payer: 59 | Attending: Hematology | Admitting: Hematology

## 2020-08-07 ENCOUNTER — Other Ambulatory Visit: Payer: Self-pay

## 2020-08-07 ENCOUNTER — Inpatient Hospital Stay: Payer: 59

## 2020-08-07 VITALS — BP 134/76 | HR 95 | Temp 97.9°F | Resp 18 | Ht 64.0 in | Wt 235.5 lb

## 2020-08-07 DIAGNOSIS — Z79899 Other long term (current) drug therapy: Secondary | ICD-10-CM | POA: Insufficient documentation

## 2020-08-07 DIAGNOSIS — Z811 Family history of alcohol abuse and dependence: Secondary | ICD-10-CM | POA: Diagnosis not present

## 2020-08-07 DIAGNOSIS — D509 Iron deficiency anemia, unspecified: Secondary | ICD-10-CM | POA: Diagnosis not present

## 2020-08-07 DIAGNOSIS — Z7901 Long term (current) use of anticoagulants: Secondary | ICD-10-CM | POA: Diagnosis not present

## 2020-08-07 DIAGNOSIS — Z8371 Family history of colonic polyps: Secondary | ICD-10-CM | POA: Diagnosis not present

## 2020-08-07 DIAGNOSIS — Z801 Family history of malignant neoplasm of trachea, bronchus and lung: Secondary | ICD-10-CM | POA: Insufficient documentation

## 2020-08-07 DIAGNOSIS — Z6841 Body Mass Index (BMI) 40.0 and over, adult: Secondary | ICD-10-CM | POA: Insufficient documentation

## 2020-08-07 DIAGNOSIS — Z9049 Acquired absence of other specified parts of digestive tract: Secondary | ICD-10-CM | POA: Diagnosis not present

## 2020-08-07 DIAGNOSIS — Z803 Family history of malignant neoplasm of breast: Secondary | ICD-10-CM | POA: Insufficient documentation

## 2020-08-07 DIAGNOSIS — Z86718 Personal history of other venous thrombosis and embolism: Secondary | ICD-10-CM | POA: Insufficient documentation

## 2020-08-07 DIAGNOSIS — Z888 Allergy status to other drugs, medicaments and biological substances status: Secondary | ICD-10-CM | POA: Diagnosis not present

## 2020-08-07 DIAGNOSIS — E538 Deficiency of other specified B group vitamins: Secondary | ICD-10-CM

## 2020-08-07 DIAGNOSIS — Z87891 Personal history of nicotine dependence: Secondary | ICD-10-CM | POA: Diagnosis not present

## 2020-08-07 DIAGNOSIS — Z823 Family history of stroke: Secondary | ICD-10-CM | POA: Insufficient documentation

## 2020-08-07 DIAGNOSIS — Z86711 Personal history of pulmonary embolism: Secondary | ICD-10-CM | POA: Diagnosis present

## 2020-08-07 DIAGNOSIS — G35 Multiple sclerosis: Secondary | ICD-10-CM | POA: Insufficient documentation

## 2020-08-07 DIAGNOSIS — D500 Iron deficiency anemia secondary to blood loss (chronic): Secondary | ICD-10-CM | POA: Diagnosis not present

## 2020-08-07 DIAGNOSIS — Z800 Family history of malignant neoplasm of digestive organs: Secondary | ICD-10-CM | POA: Insufficient documentation

## 2020-08-07 DIAGNOSIS — Z793 Long term (current) use of hormonal contraceptives: Secondary | ICD-10-CM | POA: Diagnosis not present

## 2020-08-07 DIAGNOSIS — Z886 Allergy status to analgesic agent status: Secondary | ICD-10-CM | POA: Insufficient documentation

## 2020-08-07 DIAGNOSIS — Z87440 Personal history of urinary (tract) infections: Secondary | ICD-10-CM | POA: Insufficient documentation

## 2020-08-07 DIAGNOSIS — Z83438 Family history of other disorder of lipoprotein metabolism and other lipidemia: Secondary | ICD-10-CM | POA: Diagnosis not present

## 2020-08-07 DIAGNOSIS — Z818 Family history of other mental and behavioral disorders: Secondary | ICD-10-CM | POA: Insufficient documentation

## 2020-08-07 DIAGNOSIS — Z833 Family history of diabetes mellitus: Secondary | ICD-10-CM | POA: Insufficient documentation

## 2020-08-07 DIAGNOSIS — Z885 Allergy status to narcotic agent status: Secondary | ICD-10-CM | POA: Diagnosis not present

## 2020-08-07 LAB — CBC WITH DIFFERENTIAL/PLATELET
Abs Immature Granulocytes: 0.01 10*3/uL (ref 0.00–0.07)
Basophils Absolute: 0 10*3/uL (ref 0.0–0.1)
Basophils Relative: 0 %
Eosinophils Absolute: 0.1 10*3/uL (ref 0.0–0.5)
Eosinophils Relative: 1 %
HCT: 33.2 % — ABNORMAL LOW (ref 36.0–46.0)
Hemoglobin: 10.1 g/dL — ABNORMAL LOW (ref 12.0–15.0)
Immature Granulocytes: 0 %
Lymphocytes Relative: 12 %
Lymphs Abs: 1.1 10*3/uL (ref 0.7–4.0)
MCH: 24 pg — ABNORMAL LOW (ref 26.0–34.0)
MCHC: 30.4 g/dL (ref 30.0–36.0)
MCV: 78.9 fL — ABNORMAL LOW (ref 80.0–100.0)
Monocytes Absolute: 0.9 10*3/uL (ref 0.1–1.0)
Monocytes Relative: 9 %
Neutro Abs: 6.9 10*3/uL (ref 1.7–7.7)
Neutrophils Relative %: 78 %
Platelets: 474 10*3/uL — ABNORMAL HIGH (ref 150–400)
RBC: 4.21 MIL/uL (ref 3.87–5.11)
RDW: 16.7 % — ABNORMAL HIGH (ref 11.5–15.5)
WBC: 9 10*3/uL (ref 4.0–10.5)
nRBC: 0 % (ref 0.0–0.2)

## 2020-08-07 LAB — FERRITIN: Ferritin: <4 ng/mL — ABNORMAL LOW (ref 11–307)

## 2020-08-07 LAB — VITAMIN B12: Vitamin B-12: 760 pg/mL (ref 180–914)

## 2020-08-07 NOTE — Progress Notes (Signed)
HEMATOLOGY/ONCOLOGY CONSULTATION NOTE  Date of Service: 08/07/2020  Patient Care Team: Rita Ohara, MD as PCP - General (Family Medicine) Pieter Partridge, DO as Consulting Physician (Neurology) Yisroel Ramming, Everardo All, MD as Consulting Physician (Obstetrics and Gynecology)  CHIEF COMPLAINTS/PURPOSE OF CONSULTATION:  F/u for Acute DVT and PE  HISTORY OF PRESENTING ILLNESS:    Lindsay Mccarthy is a wonderful 44 y.o. female who has been referred to [place] by Dr. Tomi Bamberger, her PCP, for evaluation and management of DVT and PE.  Patient reported a significant h/o Crohns disease and noted that she noticed on 11/26/17 that her b/l thighs were heavy swelling and painful. She initially did not have SOB. She presented to the hospital on 11/27/2017 with a sudden onset of right leg swelling for 2 days. She is also been having some pleuritic chest pain and shortness of breath upon exertion. 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 The following day she returned and had AngioJet thrombectomy and balloon venoplasty. She had residual thrombus within her inferior vena cava. Following her procedure, the pain in her right leg had almost completely resolved. Unfortunately, she began developing shortness of breath and was confirmed to have worsening PE by CT angiogram. This required thrombolysis and IVC filter placement.   She had a superficial blood clot in her left calf in 07/2017. She was not on Cimzia at that time. Blood clot went away on it's own. Red "streaking" of the leg and her the pain went away in 6 weeks.  She has been on 380m of aspirin after her superficial blood clot. She was placed on eliquis since her acute DVT and PE.   She denies family history of blood clots. She had not been traveling around that time and was being more active. She was on  Birth control in the past and blood clots never occurred. She was never pregnant. Her previous stricture resection surgery did not lead to a blood clot.   She was diagnosed with Crohn's disease when she was on 136 She reviewed her medication for this. She had a stricture resected 12 years ago. She denies having IV iron or blood transfusions. She has not had a period in 8 years. She is currently on Mirena. Prior to did not have menorrhagia. Last colonoscopy was fall 2018.   On review of symptoms, pt notes she has rarely has GI bleeding. Breathing is back to baseline, only some shortness of breath upon significant exertion.    INTERVAL HISTORY   Lindsay HAGENOWreturns for a follow up of her acute DVT and PE. The patient's last visit with [place] on 05/07/2020. The pt reports that she is doing well overall.  The pt reports no acute new issues. Tolerating anticoagulation without significant bleeding.  Lab results today (08/07/20) of CBC w/diff and CMP is as follows:. 08/07/2020 Ferritin at <4 08/07/2020 Vitamin B12 at 760 (up from 173) 08/07/2020 Methylmalonic acid at 229  MEDICAL HISTORY:  Past Medical History:  Diagnosis Date  . Anemia    related to Crohns flares  . Arm DVT (deep venous thromboembolism), acute, right ([place] 11/29/2017  . Arthritis    knees, feet, hands, wrists, related to Crohns flare  . Asthma    Childhood  . B12 deficiency    monitored/treated by Dr. GCarlean Purl .  Cervical dysplasia   . Crohn's disease of small intestine ([place]) 06/24/2012   Diagnosed 1999, in [place]. Ileitis only then. Treated with Imuran Remicade and prednisone.  Noncompliant with therapy. 2004 return to care and was treated with Remicade prednisone Pentasa Cipro and Flagyl. 2006 status post right hemicolectomy. Subsequently treated with azathioprine and Cimzia. 200 mg every other week.  azathioprine was added in 2010. Prometheus TP MT enzyme was negative.  . Depression   . DVT of upper extremity  (deep vein thrombosis) ([place]) 11/28/2017  . Eczema    arms and behind knees, worse in winter  . Generalized anxiety disorder   . History of recurrent UTI (urinary tract infection)   . HPV (human papilloma virus) infection   . Hyperlipemia   . Hypertriglyceridemia 07/03/2012   10/2010 labs showed level of 753 with total cholesterol 180 and HDL 41' Labs 05/2013--TG 180   . Major depressive disorder in remission ([place]) 10/03/2013  . Multiple sclerosis ([place]) 2019   Officially diagnosed in the Fall  . Neuromuscular disorder ([place])   . Osteopenia 09/29/2011   T-1.6  . Papanicolaou smear 12/12   last abnormal 2011  . Pulmonary embolism ([place]) 11/30/2017  . Scaphoid fracture of wrist 2013   left  . Seasonal allergic rhinitis   . Shingles 09/2015   R hip  . Vitamin B12 deficiency 06/19/2012  . Vitamin D deficiency 12/16/2015   Vitamin D-OH level 12     SURGICAL HISTORY: Past Surgical History:  Procedure Laterality Date  . APPENDECTOMY    . CERVICAL BIOPSY  W/ LOOP ELECTRODE EXCISION  2009   ---paps normal since  . COLONOSCOPY  multiple   scanned  . ESOPHAGOGASTRODUODENOSCOPY  multiple   scanned  . HEMICOLECTOMY  2006  . IR ANGIOGRAM PULMONARY BILATERAL SELECTIVE  12/03/2017  . IR ANGIOGRAM SELECTIVE EACH ADDITIONAL VESSEL  12/03/2017  . IR ANGIOGRAM SELECTIVE EACH ADDITIONAL VESSEL  12/03/2017  . IR INFUSION THROMBOL ARTERIAL INITIAL (MS)  12/03/2017  . IR INFUSION THROMBOL ARTERIAL INITIAL (MS)  12/03/2017  . IR IVC FILTER PLMT / S&I /IMG GUID/MOD SED  12/04/2017  . IR THROMB F/U EVAL ART/VEN FINAL DAY (MS)  12/04/2017  . IR US GUIDE VASC ACCESS RIGHT  12/03/2017  . IVC FILTER REMOVAL N/A 07/11/2018   Procedure: IVC FILTER REMOVAL;  Surgeon: Serafina Mitchell, MD;  Location: [facility];  Service: Cardiovascular;  Laterality: N/A;  . IVC VENOGRAPHY N/A 07/11/2018   Procedure: IVC Venography;  Surgeon: Serafina Mitchell, MD;  Location: [facility];  Service: Cardiovascular;  Laterality:  N/A;  . LEEP  2011   --done in [place]  . PERIPHERAL VASCULAR BALLOON ANGIOPLASTY Right 11/30/2017   Procedure: PERIPHERAL VASCULAR BALLOON ANGIOPLASTY;  Surgeon: Serafina Mitchell, MD;  Location: [facility];  Service: Cardiovascular;  Laterality: Right;  . PERIPHERAL VASCULAR THROMBECTOMY N/A 11/29/2017   Procedure: PERIPHERAL VASCULAR THROMBECTOMY - THROMBOLYSIS;  Surgeon: Serafina Mitchell, MD;  Location: [facility];  Service: Cardiovascular;  Laterality: N/A;  LYSIS CATHETER PLACEMENT ONLY  . PERIPHERAL VASCULAR THROMBECTOMY N/A 11/30/2017   Procedure: PERIPHERAL VASCULAR THROMBECTOMY - Lysis Recheck;  Surgeon: Serafina Mitchell, MD;  Location: [facility];  Service: Cardiovascular;  Laterality: N/A;  . WISDOM TOOTH EXTRACTION      SOCIAL HISTORY: Social History   Socioeconomic History  . Marital status: Married    Spouse name: Marcello Moores  . Number of children: 0  . Years of  education: 109  . Highest education level: Associate degree: occupational, Hotel manager, or vocational program  Occupational History  . 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 Smoking status: Former Smoker    Packs/day: 1.00    Years: 4.00    Pack years: 4.00    Quit date: 11/18/1998    Years since quitting: 21.7  . Smokeless tobacco: Never Used  Vaping Use  . Vaping Use: Never used  Substance and Sexual Activity  . Alcohol use: No    Alcohol/week: 0.0 standard drinks  . Drug use: No  . Sexual activity: Yes    Partners: Male    Birth control/protection: I.U.D.    Comment: Mirena inserted 01/2016  Other Topics Concern  . Not on file  Social History Narrative   The patient is divorced.     Re-married Marcello Moores, partner of 10 years, on 10/10/16. 2 dogs, 1 cat   No children - doesn't want any   Curator for [employer].   Moved from [place] to [place] in 2013.   Past smoker   No alcohol   2-3 caffeinated beverages a day   She  reports she is compliant with sunscreen given her increased risk of sun damage and skin cancer (no longer on azathioprine)      Updated 12/24/19   Social Determinants of Health   Financial Resource Strain:   . Difficulty of Paying Living Expenses: Not on file  Food Insecurity:   . Worried About Charity fundraiser in the Last Year: Not on file  . Ran Out of Food in the Last Year: Not on file  Transportation Needs:   . Lack of Transportation (Medical): Not on file  . Lack of Transportation (Non-Medical): Not on file  Physical Activity:   . Days of Exercise per Week: Not on file  . Minutes of Exercise per Session: Not on file  Stress:   . Feeling of Stress : Not on file  Social Connections:   . Frequency of Communication with Friends and Family: Not on file  . Frequency of Social Gatherings with Friends and Family: Not on file  . Attends Religious Services: Not on file  . Active Member of Clubs or Organizations: Not on file  . Attends Archivist Meetings: Not on file  . Marital Status: Not on file  Intimate Partner Violence:   . Fear of Current or Ex-Partner: Not on file  . Emotionally Abused: Not on file  . Physically Abused: Not on file  . Sexually Abused: Not on file    FAMILY HISTORY: Family History  Problem Relation Age of Onset  . Hypertension Mother   . Breast cancer Mother 67  . Bone cancer Mother        metastatic from breast  . Depression Mother   . Hypothyroidism Mother   . Colon polyps Father   . Diabetes Father        borderline--resolved 2020  . Hypertension Father        off meds now  . Hyperlipidemia Father        off meds now  . Fuch's dystrophy Father   . Colon cancer Paternal Grandfather   . Multiple sclerosis Sister        affecting brain only  . Alcohol abuse Sister   . Fuch's dystrophy Sister   . Stroke Maternal Grandmother   . Lung cancer Maternal Grandmother   . Hypertension Maternal Grandmother   .  Heart disease Paternal  Grandmother   . Hypertension Paternal Grandmother   . Hypertension Sister     ALLERGIES:  is allergic to iron, venofer [iron sucrose], ibuprofen, morphine and related, prednisone, and adhesive [tape].  MEDICATIONS:  Current Outpatient Medications  Medication Sig Dispense Refill  . acetaminophen (TYLENOL) 500 MG tablet Take 1,000 mg by mouth every 6 (six) hours as needed for headache (pain). Reported on 12/15/2015    . baclofen (LIORESAL) 10 MG tablet TAKE 1 TABLET(10 MG) BY MOUTH THREE TIMES DAILY AS NEEDED FOR MUSCLE SPASMS 90 tablet 1  . buPROPion (WELLBUTRIN SR) 100 MG 12 hr tablet Take 1 tablet (100 mg total) by mouth 2 (two) times daily. 180 tablet 3  . cetirizine (ZYRTEC) 10 MG tablet Take 10 mg by mouth at bedtime.     . CHOLECALCIFEROL PO Take 5,000 Units by mouth 2 (two) times daily.    . colestipol (COLESTID) 5 g packet TAKE 5 GRAMS BY MOUTH DAILY 30 each 5  . cyanocobalamin (,VITAMIN B-12,) 1000 MCG/ML injection Inject 1 mL (1,000 mcg total) into the skin every 14 (fourteen) days. 10 mL 2  . dalfampridine 10 MG TB12 Take by mouth 2 (two) times daily.     Marland Kitchen dicyclomine (BENTYL) 10 MG capsule Take 1 capsule (10 mg total) by mouth every 6 (six) hours as needed for spasms (abd pain/cramping). 90 capsule 4  . diphenhydrAMINE (BENADRYL) 25 MG tablet Take 25 mg by mouth at bedtime as needed for sleep.     Marland Kitchen ELIQUIS 5 MG TABS tablet TAKE 1 TABLET(5 MG) BY MOUTH TWICE DAILY 60 tablet 5  . escitalopram (LEXAPRO) 10 MG tablet TAKE 1 TABLET(10 MG) BY MOUTH AT BEDTIME 90 tablet 3  . fenofibrate 160 MG tablet TAKE 1 TABLET BY MOUTH AT  BEDTIME 90 tablet 1  . Insulin Syringe 27G X 1/2" 1 ML MISC 1 each by Does not apply route every 14 (fourteen) days. For [place] B12 injection 24 each 1  . levonorgestrel (MIRENA) 20 MCG/24HR IUD 1 each by Intrauterine route once. Implanted April 2017    . NON FORMULARY Take 1 each by mouth at bedtime.    Marland Kitchen ocrelizumab 600 mg in sodium chloride 0.9 % 500 mL Inject 600  mg into the vein every 6 (six) months.    . Omega-3 Fatty Acids (FISH OIL) 1200 MG CPDR Take 3,600 mg by mouth daily.    Marland Kitchen OVER THE COUNTER MEDICATION Apply 1 application topically daily as needed (arthritis pain). CBD Oil    . vedolizumab (ENTYVIO) 300 MG injection Inject 300 mg into the vein every 8 (eight) weeks. 1 vial 6   No current facility-administered medications for this visit.    REVIEW OF SYSTEMS:   A 10+ POINT REVIEW OF SYSTEMS WAS OBTAINED including neurology, dermatology, psychiatry, cardiac, respiratory, lymph, extremities, GI, GU, Musculoskeletal, constitutional, breasts, reproductive, HEENT.  All pertinent positives are noted in the HPI.  All others are negative.   PHYSICAL EXAMINATION: ECOG PERFORMANCE STATUS: 1 - Symptomatic but completely ambulatory  VSS NAD GENERAL:alert, in no acute distress and comfortable SKIN: no acute rashes, no significant lesions EYES: conjunctiva are pink and non-injected, sclera anicteric OROPHARYNX: MMM, no exudates, no oropharyngeal erythema or ulceration NECK: supple, no JVD LYMPH:  no palpable lymphadenopathy in the cervical, axillary or inguinal regions LUNGS: clear to auscultation b/l with normal respiratory effort HEART: regular rate & rhythm ABDOMEN:  normoactive bowel sounds , non tender, not distended. No palpable hepatosplenomegaly.  Extremity:  no pedal edema PSYCH: alert & oriented x 3 with fluent speech NEURO: no focal motor/sensory deficits  LABORATORY DATA:  I have reviewed the data as listed  . CBC Latest Ref Rng & Units 07/16/2020 05/07/2020 04/11/2020  WBC 4.0 - 10.5 K/uL 10.3 8.5 9.8  Hemoglobin 12.0 - 15.0 g/dL 10.8(L) 10.3(L) 10.8(L)  Hematocrit 36 - 46 % 34.4(L) 33.3(L) 33.1(L)  Platelets 150 - 400 K/uL 491.0(H) 421(H) 409.0(H)   . CBC    Component Value Date/Time   WBC 10.3 07/16/2020 1649   RBC 4.40 07/16/2020 1649   HGB 10.8 (L) 07/16/2020 1649   HGB 13.1 12/18/2019 0820   HCT 34.4 (L) 07/16/2020 1649    HCT 40.7 12/18/2019 0820   PLT 491.0 (H) 07/16/2020 1649   PLT 475 (H) 12/18/2019 0820   MCV 78.2 07/16/2020 1649   MCV 90 12/18/2019 0820   MCH 26.0 05/07/2020 1010   MCHC 31.4 07/16/2020 1649   RDW 18.3 (H) 07/16/2020 1649   RDW 13.0 12/18/2019 0820   LYMPHSABS 1.4 07/16/2020 1649   LYMPHSABS 1.1 12/18/2019 0820   MONOABS 0.9 07/16/2020 1649   EOSABS 0.2 07/16/2020 1649   EOSABS 0.1 12/18/2019 0820   BASOSABS 0.0 07/16/2020 1649   BASOSABS 0.0 12/18/2019 0820     . CMP Latest Ref Rng & Units 07/16/2020 05/07/2020 12/18/2019  Glucose 70 - 99 mg/dL 95 82 80  BUN 6 - 23 mg/dL 14 9 9   Creatinine 0.40 - 1.20 mg/dL 0.99 0.83 0.88  Sodium 135 - 145 mEq/L 139 141 141  Potassium 3.5 - 5.1 mEq/L 3.4(L) 4.0 4.2  Chloride 96 - 112 mEq/L 103 107 103  CO2 19 - 32 mEq/L 27 23 24   Calcium 8.4 - 10.5 mg/dL 8.7 8.7(L) 9.0  Total Protein 6.0 - 8.3 g/dL 7.1 6.9 6.5  Total Bilirubin 0.2 - 1.2 mg/dL 0.2 <0.2(L) 0.2  Alkaline Phos 39 - 117 U/L 46 58 53  AST 0 - 37 U/L 15 16 18   ALT 0 - 35 U/L 17 19 17    Component     Latest Ref Rng & Units 01/09/2018  PTT Lupus Anticoagulant     0.0 - 51.9 sec 34.3  DRVVT     0.0 - 47.0 sec 53.9 (H)  Lupus Anticoag Interp      Comment:  Beta-2 Glycoprotein I Ab, IgG     0 - 20 GPI IgG units 9  Beta-2-Glycoprotein I IgM     0 - 32 GPI IgM units <9  Beta-2-Glycoprotein I IgA     0 - 25 GPI IgA units <9  Anticardiolipin Ab,IgG,Qn     0 - 14 GPL U/mL <9  Anticardiolipin Ab,IgM,Qn     0 - 12 MPL U/mL 13 (H)  Anticardiolipin Ab,IgA,Qn     0 - 11 APL U/mL <9    Lupus anticoagulant panel      The value has a corrected status.      No reference range information available      Resulting Lab: [facility]      Comments:           (NOTE)           No lupus anticoagulant was detected  RADIOGRAPHIC STUDIES: I have personally reviewed the radiological images as listed and agreed with the findings in the report. CT Abdomen Pelvis W  Contrast  Result Date: 08/01/2020 CLINICAL DATA:  Right lower quadrant abdominal pain, Crohn's disease EXAM: CT ABDOMEN AND  PELVIS WITH CONTRAST TECHNIQUE: Multidetector CT imaging of the abdomen and pelvis was performed using the standard protocol following bolus administration of intravenous contrast. CONTRAST:  172m OMNIPAQUE IOHEXOL 300 MG/ML  SOLN COMPARISON:  07/19/2018 FINDINGS: Lower chest: Lung bases are clear. Hepatobiliary: Liver is notable for tiny cysts in the left hepatic lobe (series 2/images 16 and 17). Gallbladder is unremarkable. No intrahepatic or extrahepatic ductal dilatation. Pancreas: Within normal limits. Spleen: Within normal limits. Adrenals/Urinary Tract: Adrenal glands are within normal limits. 5.2 cm right lower pole renal cyst (series 2/image 44), benign (Bosniak I). Additional 11 mm anterior interpolar left renal cyst (series 2/image 31), benign. No hydronephrosis. Bladder is within normal limits. Stomach/Bowel: Stomach is within normal limits. Status post ileocecal resection with appendectomy. Mild wall thickening involving the neoterminal ileum (coronal image 30), although this segment is underdistended. No mesenteric inflammatory changes/fluid. Small reactive lymph nodes along the right mid abdominal mesentery, measuring up to 5 mm short axis (series 2/image 45). No evidence of bowel obstruction or stricture. No associated fistula or abscess. No colonic wall thickening or inflammatory changes. Vascular/Lymphatic: No evidence of abdominal aortic aneurysm. No suspicious abdominopelvic lymphadenopathy. Reproductive: Uterus is notable for an IUD in satisfactory position. Bilateral ovaries are unremarkable. Other: No abdominopelvic ascites. Musculoskeletal: Visualized osseous structures are within normal limits. IMPRESSION: Status post ileocecal resection. Mild wall thickening involving the neoterminal ileum, equivocal, although possibly reflecting mild active inflammatory Crohn's  disease. No evidence of bowel obstruction or stricture. No associated fistula or abscess. Electronically Signed   By: SJulian HyM.D.   On: 08/01/2020 12:19    ASSESSMENT & PLAN:   PNAVREET BOLDAis a 44y.o. caucasian female with   1. Acute DVT and Submassive PE No definitive provocation of DVT. PE likely from dislodged DVT after mechanical thrombectomy and balloon venoplasty Has IVC filter placed.  On Eliquis since 12/2017 Non smoker. No recent use of hormonal contracept. Prothrombin gene mutation/Factor V leiden mutation negative, Anti-phoshopholipid ab negative. Jak2 testing results negative. Lupus negative. Based on results, elevated platelets likely had a reactive cause due to inflammation from Crohn's and elevated iron.   There is no height or weight on file to calculate BMI. - obesity could an additional contributing risk factor. 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  2. Elevated platelets  Have been elevated 300-800K for over a year  -likely reactive to iron deficiency anemia and inflammation from Crohn's disease  3. Iron Deficiency Anemia . Lab Results  Component Value Date   IRON 31 (L) 05/07/2020   TIBC 621 (H) 05/07/2020   IRONPCTSAT 5 (L) 05/07/2020   (Iron and TIBC)  Lab Results  Component Value Date   FERRITIN <4 (L) 05/07/2020  -on ferrous sulfate BID. Tolerating well -continue PO ironto maintain ferritin levels >50. -if still low in 3 months would consider IV iron replacement.  4. Vitamin B12 deficiency  -seen at 14403on 01/09/18 labs  -Currently on monthly B12 injections with PCP, will continue    5. Crohn's Disease -Mx by Dr. GCarlean Purl-On Imuran and Entyvio   PLAN:   -Continue f/u with Dr. GCarlean Purlfor Crohn's Disease and Dr. JTomi Likensfor MS  -Continue 1000 mcg Vitamin B12 [place] every 2 weeks -Continue 5 mg Eliquis  -will offer patient option  to replace iron IV   FOLLOW UP: IV Injectafer weekly x 2 RTC with Dr KIrene Limbowith  labs in 4 months   The total time spent in the appt was 20 minutes and more than 50% was on counseling and direct patient cares.  All of the patient's questions were answered with apparent satisfaction. The patient knows to call the clinic with any problems, questions or concerns.    Sullivan Lone MD [place] AAHIVMS [facility] Hematology/Oncology Physician [facility]  (Office):       [phone] (Work cell):  [phone] (Fax):           [phone]  08/07/2020 8:59 AM  I, Yevette Edwards, am acting as a scribe for Dr. Sullivan Lone.   .I have reviewed the above documentation for accuracy and completeness, and I agree with the above. Brunetta Genera MD

## 2020-08-10 LAB — METHYLMALONIC ACID, SERUM: Methylmalonic Acid, Quantitative: 229 nmol/L (ref 0–378)

## 2020-08-14 ENCOUNTER — Telehealth: Payer: Self-pay | Admitting: *Deleted

## 2020-08-14 NOTE — Telephone Encounter (Signed)
Contacted patient regarding test results per Dr. Grier Mitts directions in message below. Patient is in agreement. Schedule message sent to schedule for IV injectafer 2 doses 1 week apart.

## 2020-08-14 NOTE — Telephone Encounter (Signed)
-----   Message from Brunetta Genera, MD sent at 08/14/2020  1:23 AM EDT ----- Plz let patient know her iron levels are still very low. Can schedule for IV Injectafer if patient agreable

## 2020-08-17 ENCOUNTER — Other Ambulatory Visit: Payer: Self-pay | Admitting: Neurology

## 2020-08-18 ENCOUNTER — Telehealth: Payer: Self-pay | Admitting: Hematology

## 2020-08-18 NOTE — Telephone Encounter (Signed)
Scheduled appt per 10/14 sch msg  - pt aware of appt date and time

## 2020-08-22 ENCOUNTER — Other Ambulatory Visit: Payer: Self-pay | Admitting: Hematology

## 2020-08-22 ENCOUNTER — Inpatient Hospital Stay: Payer: 59

## 2020-08-22 ENCOUNTER — Other Ambulatory Visit: Payer: Self-pay

## 2020-08-22 VITALS — BP 137/84 | HR 77 | Temp 98.5°F | Resp 18

## 2020-08-22 DIAGNOSIS — D509 Iron deficiency anemia, unspecified: Secondary | ICD-10-CM | POA: Insufficient documentation

## 2020-08-22 DIAGNOSIS — D500 Iron deficiency anemia secondary to blood loss (chronic): Secondary | ICD-10-CM

## 2020-08-22 MED ORDER — LORATADINE 10 MG PO TABS
10.0000 mg | ORAL_TABLET | Freq: Once | ORAL | Status: AC
  Administered 2020-08-22: 10 mg via ORAL

## 2020-08-22 MED ORDER — LORATADINE 10 MG PO TABS
ORAL_TABLET | ORAL | Status: AC
  Filled 2020-08-22: qty 1

## 2020-08-22 MED ORDER — ACETAMINOPHEN 325 MG PO TABS
650.0000 mg | ORAL_TABLET | Freq: Once | ORAL | Status: AC
  Administered 2020-08-22: 650 mg via ORAL

## 2020-08-22 MED ORDER — FAMOTIDINE IN NACL 20-0.9 MG/50ML-% IV SOLN
INTRAVENOUS | Status: AC
  Filled 2020-08-22: qty 50

## 2020-08-22 MED ORDER — SODIUM CHLORIDE 0.9 % IV SOLN
125.0000 mg | Freq: Once | INTRAVENOUS | Status: AC
  Administered 2020-08-22: 125 mg via INTRAVENOUS
  Filled 2020-08-22: qty 10

## 2020-08-22 MED ORDER — ACETAMINOPHEN 325 MG PO TABS
ORAL_TABLET | ORAL | Status: AC
  Filled 2020-08-22: qty 2

## 2020-08-22 MED ORDER — FAMOTIDINE IN NACL 20-0.9 MG/50ML-% IV SOLN
20.0000 mg | Freq: Once | INTRAVENOUS | Status: AC
  Administered 2020-08-22: 20 mg via INTRAVENOUS

## 2020-08-22 MED ORDER — SODIUM CHLORIDE 0.9 % IV SOLN
Freq: Once | INTRAVENOUS | Status: AC
  Filled 2020-08-22: qty 250

## 2020-08-22 NOTE — Patient Instructions (Signed)
Sodium Ferric Gluconate Complex injection What is this medicine? SODIUM FERRIC GLUCONATE COMPLEX (SOE dee um FER ik GLOO koe nate KOM pleks) is an iron replacement. It is used with epoetin therapy to treat low iron levels in patients who are receiving hemodialysis. This medicine may be used for other purposes; ask your health care provider or pharmacist if you have questions. COMMON BRAND NAME(S): Ferrlecit, Nulecit What should I tell my health care provider before I take this medicine? They need to know if you have any of the following conditions:  anemia that is not from iron deficiency  high levels of iron in the body  an unusual or allergic reaction to iron, benzyl alcohol, other medicines, foods, dyes, or preservatives  pregnant or are trying to become pregnant  breast-feeding How should I use this medicine? This medicine is for infusion into a vein. It is given by a health care professional in a hospital or clinic setting. Talk to your pediatrician regarding the use of this medicine in children. While this drug may be prescribed for children as young as 42 years old for selected conditions, precautions do apply. Overdosage: If you think you have taken too much of this medicine contact a poison control center or emergency room at once. NOTE: This medicine is only for you. Do not share this medicine with others. What if I miss a dose? It is important not to miss your dose. Call your doctor or health care professional if you are unable to keep an appointment. What may interact with this medicine? Do not take this medicine with any of the following medications:  deferoxamine  dimercaprol  other iron products This medicine may also interact with the following medications:  chloramphenicol  deferasirox  medicine for blood pressure like enalapril This list may not describe all possible interactions. Give your health care provider a list of all the medicines, herbs,  non-prescription drugs, or dietary supplements you use. Also tell them if you smoke, drink alcohol, or use illegal drugs. Some items may interact with your medicine. What should I watch for while using this medicine? Your condition will be monitored carefully while you are receiving this medicine. Visit your doctor for check-ups as directed. What side effects may I notice from receiving this medicine? Side effects that you should report to your doctor or health care professional as soon as possible:  allergic reactions like skin rash, itching or hives, swelling of the face, lips, or tongue  breathing problems  changes in hearing  changes in vision  chills, flushing, or sweating  fast, irregular heartbeat  feeling faint or lightheaded, falls  fever, flu-like symptoms  high or low blood pressure  pain, tingling, numbness in the hands or feet  severe pain in the chest, back, flanks, or groin  swelling of the ankles, feet, hands  trouble passing urine or change in the amount of urine  unusually weak or tired Side effects that usually do not require medical attention (report to your doctor or health care professional if they continue or are bothersome):  cramps  dark colored stools  diarrhea  headache  nausea, vomiting  stomach upset This list may not describe all possible side effects. Call your doctor for medical advice about side effects. You may report side effects to FDA at 1-800-FDA-1088. Where should I keep my medicine? This drug is given in a hospital or clinic and will not be stored at home. NOTE: This sheet is a summary. It may not cover all  possible information. If you have questions about this medicine, talk to your doctor, pharmacist, or health care provider.  2020 Elsevier/Gold Standard (2008-06-19 15:58:57)

## 2020-08-27 ENCOUNTER — Other Ambulatory Visit: Payer: Self-pay | Admitting: Family Medicine

## 2020-08-28 ENCOUNTER — Other Ambulatory Visit: Payer: Self-pay | Admitting: Emergency Medicine

## 2020-08-28 ENCOUNTER — Other Ambulatory Visit: Payer: 59

## 2020-08-28 DIAGNOSIS — Z79899 Other long term (current) drug therapy: Secondary | ICD-10-CM

## 2020-08-28 DIAGNOSIS — Z7960 Long term (current) use of unspecified immunomodulators and immunosuppressants: Secondary | ICD-10-CM

## 2020-08-28 DIAGNOSIS — K50012 Crohn's disease of small intestine with intestinal obstruction: Secondary | ICD-10-CM

## 2020-08-29 ENCOUNTER — Inpatient Hospital Stay: Payer: 59

## 2020-08-29 ENCOUNTER — Other Ambulatory Visit: Payer: Self-pay

## 2020-08-29 ENCOUNTER — Ambulatory Visit
Admission: RE | Admit: 2020-08-29 | Discharge: 2020-08-29 | Disposition: A | Discharge status: Home / Self Care | Payer: 59 | Source: Ambulatory Visit | Attending: Family Medicine | Admitting: Family Medicine

## 2020-08-29 VITALS — BP 121/65 | HR 73 | Temp 98.0°F | Resp 18

## 2020-08-29 DIAGNOSIS — D509 Iron deficiency anemia, unspecified: Secondary | ICD-10-CM | POA: Diagnosis not present

## 2020-08-29 DIAGNOSIS — D500 Iron deficiency anemia secondary to blood loss (chronic): Secondary | ICD-10-CM

## 2020-08-29 DIAGNOSIS — R911 Solitary pulmonary nodule: Secondary | ICD-10-CM

## 2020-08-29 MED ORDER — FAMOTIDINE IN NACL 20-0.9 MG/50ML-% IV SOLN
INTRAVENOUS | Status: AC
  Filled 2020-08-29: qty 50

## 2020-08-29 MED ORDER — SODIUM CHLORIDE 0.9 % IV SOLN
125.0000 mg | Freq: Once | INTRAVENOUS | Status: AC
  Administered 2020-08-29: 125 mg via INTRAVENOUS
  Filled 2020-08-29: qty 10

## 2020-08-29 MED ORDER — FAMOTIDINE IN NACL 20-0.9 MG/50ML-% IV SOLN
20.0000 mg | Freq: Once | INTRAVENOUS | Status: AC
  Administered 2020-08-29: 20 mg via INTRAVENOUS

## 2020-08-29 MED ORDER — ACETAMINOPHEN 325 MG PO TABS
ORAL_TABLET | ORAL | Status: AC
  Filled 2020-08-29: qty 2

## 2020-08-29 MED ORDER — LORATADINE 10 MG PO TABS
ORAL_TABLET | ORAL | Status: AC
  Filled 2020-08-29: qty 1

## 2020-08-29 MED ORDER — SODIUM CHLORIDE 0.9 % IV SOLN
Freq: Once | INTRAVENOUS | Status: AC
  Filled 2020-08-29: qty 250

## 2020-08-29 MED ORDER — LORATADINE 10 MG PO TABS
10.0000 mg | ORAL_TABLET | Freq: Once | ORAL | Status: AC
  Administered 2020-08-29: 10 mg via ORAL

## 2020-08-29 MED ORDER — ACETAMINOPHEN 325 MG PO TABS
650.0000 mg | ORAL_TABLET | Freq: Once | ORAL | Status: AC
  Administered 2020-08-29: 650 mg via ORAL

## 2020-08-29 NOTE — Patient Instructions (Signed)
Sodium Ferric Gluconate Complex injection What is this medicine? SODIUM FERRIC GLUCONATE COMPLEX (SOE dee um FER ik GLOO koe nate KOM pleks) is an iron replacement. It is used with epoetin therapy to treat low iron levels in patients who are receiving hemodialysis. This medicine may be used for other purposes; ask your health care provider or pharmacist if you have questions. COMMON BRAND NAME(S): Ferrlecit, Nulecit What should I tell my health care provider before I take this medicine? They need to know if you have any of the following conditions:  anemia that is not from iron deficiency  high levels of iron in the body  an unusual or allergic reaction to iron, benzyl alcohol, other medicines, foods, dyes, or preservatives  pregnant or are trying to become pregnant  breast-feeding How should I use this medicine? This medicine is for infusion into a vein. It is given by a health care professional in a hospital or clinic setting. Talk to your pediatrician regarding the use of this medicine in children. While this drug may be prescribed for children as young as 31 years old for selected conditions, precautions do apply. Overdosage: If you think you have taken too much of this medicine contact a poison control center or emergency room at once. NOTE: This medicine is only for you. Do not share this medicine with others. What if I miss a dose? It is important not to miss your dose. Call your doctor or health care professional if you are unable to keep an appointment. What may interact with this medicine? Do not take this medicine with any of the following medications:  deferoxamine  dimercaprol  other iron products This medicine may also interact with the following medications:  chloramphenicol  deferasirox  medicine for blood pressure like enalapril This list may not describe all possible interactions. Give your health care provider a list of all the medicines, herbs,  non-prescription drugs, or dietary supplements you use. Also tell them if you smoke, drink alcohol, or use illegal drugs. Some items may interact with your medicine. What should I watch for while using this medicine? Your condition will be monitored carefully while you are receiving this medicine. Visit your doctor for check-ups as directed. What side effects may I notice from receiving this medicine? Side effects that you should report to your doctor or health care professional as soon as possible:  allergic reactions like skin rash, itching or hives, swelling of the face, lips, or tongue  breathing problems  changes in hearing  changes in vision  chills, flushing, or sweating  fast, irregular heartbeat  feeling faint or lightheaded, falls  fever, flu-like symptoms  high or low blood pressure  pain, tingling, numbness in the hands or feet  severe pain in the chest, back, flanks, or groin  swelling of the ankles, feet, hands  trouble passing urine or change in the amount of urine  unusually weak or tired Side effects that usually do not require medical attention (report to your doctor or health care professional if they continue or are bothersome):  cramps  dark colored stools  diarrhea  headache  nausea, vomiting  stomach upset This list may not describe all possible side effects. Call your doctor for medical advice about side effects. You may report side effects to FDA at 1-800-FDA-1088. Where should I keep my medicine? This drug is given in a hospital or clinic and will not be stored at home. NOTE: This sheet is a summary. It may not cover all  possible information. If you have questions about this medicine, talk to your doctor, pharmacist, or health care provider.  2020 Elsevier/Gold Standard (2008-06-19 15:58:57)

## 2020-08-29 NOTE — Progress Notes (Signed)
Patient only wanted to stay for 15 minutes of her post-observation period. Patient's vitals retaken and remained stable.  Patient stated that she felt "fine". Patient showed no signs of distress on discharge and had no complaints.

## 2020-08-31 LAB — QUANTIFERON-TB GOLD PLUS
Mitogen-NIL: 0.97 IU/mL
NIL: 0.01 IU/mL
QuantiFERON-TB Gold Plus: NEGATIVE
TB1-NIL: 0 IU/mL
TB2-NIL: 0 IU/mL

## 2020-09-05 ENCOUNTER — Telehealth: Payer: Self-pay | Admitting: Neurology

## 2020-09-05 ENCOUNTER — Encounter: Payer: Self-pay | Admitting: Hematology

## 2020-09-06 LAB — SERIAL MONITORING

## 2020-09-07 LAB — VEDOLIZUMAB AND ANTI-VEDO AB
Anti-Vedolizumab Antibody: <25 ng/mL
Vedolizumab: 3.9 ug/mL

## 2020-09-08 ENCOUNTER — Encounter: Payer: Self-pay | Admitting: Family Medicine

## 2020-09-08 ENCOUNTER — Other Ambulatory Visit: Payer: Self-pay

## 2020-09-08 MED ORDER — ENTYVIO 300 MG IV SOLR: 300.0000 mg | INTRAVENOUS | 6 refills | Status: DC

## 2020-09-09 ENCOUNTER — Other Ambulatory Visit: Payer: Self-pay | Admitting: Neurology

## 2020-09-10 MED ORDER — BACLOFEN 10 MG PO TABS
ORAL_TABLET | ORAL | 0 refills | Status: DC
Start: 2020-09-10 — End: 2020-12-23

## 2020-09-18 ENCOUNTER — Other Ambulatory Visit: Payer: Self-pay | Admitting: Neurology

## 2020-10-10 NOTE — Progress Notes (Signed)
NEUROLOGY FOLLOW UP OFFICE NOTE  Lindsay Mccarthy [id number]   Subjective:  Lindsay Mccarthy a 44 year old right-handed woman with Crohn's disease, depression, and anxiety who follows up for multiple sclerosis.  She is accompanied by her husband who supplements history.  MRI of brain from July personally reviewed.  UPDATE: Current disease modifying therapy: Ocrevus(first dose on2/14/20, followed by the second dose on 12/29/18. Last infusion in early October).  Other medication: D35000 IU BID, B12 injections,Ampyra, baclofen,Wellbutrin, Lexapro 30m, ,Colestid, Bentayl, Benadryl, Eliquis, Mirena, Entyvio 06/30/2020 LABS:  Vit D, 25-hydroxy 29.4 07/16/2020 LABS:  IgA 137, IgG 1052, IgM 36; sed rate 17, CRP<1;  CBC with WBC 10.3, HGB 10.8, HCT 34.4, PLT 491, ALC 1.4; CMP with Na 139, K 3.4, Cl 103, CO2 27, glucose 95, BUN 14, Cr 0.99, t bili 0.2, ALP 46, AST 15, ALT 17;  08/07/2020 LABS:  B12 175, ferritin <4.  08/28/2020 LABS:  Quantiferon-TB Gold Plus negative  05/02/2020 MRI BRAIN W WO:  Unchanged mild subcortical/juxtacortical and deep periventricular white matter foci of signal abnormality consistent with the provided history of multiple sclerosis. No new or enhancing lesion is identified.  Overall, she is feeling well.  Symptoms are stable.  Notes some exacerbation of weakness with heat.   Vision:No issues Motor: No new issues Sensory: numbness andtingling in legs  Pain: Spasms in legs.. Gait: Gait improved since restarting Ampyra.  Now usually ambulates without cane unless outside. Bowel/Bladder: Occasional urinary incontinence when stressed.  Crohn's disease. Fatigue: Some fatigue. Cognition:Trouble multi-tasking.  She underwent neuropsychological testing on 09/04/2019. Performance fell largely within normal limits. She did exhibit a weakness across aspects of attention/concentration and some performance variability across processing speed. Performance  involving complex attention, executive functioning, receptive and expressive language, visual-spatial abilities and verbal and visual learning and memory were within normal range. Mood: Stable  HISTORY: She reports history of various neurologic symptoms since her late 243s - Urinary incontinence: It started about a year ago and has gotten worse.It occurs suddenly, once or twice a month, usually at home and while standing, such as while washing dishes with the water running. It occurs during fatigued oremotional stress as well. She denies increased urinary frequency or dysuria. No perineal numbness. No low back pain. - She reports poor night vision with difficulty seeing while driving at night.She has trouble focusing. Recent eye exam showed poor "refocusing", which would normally be seen in an older adult. Her sister had Fuchs dystrophy but she doesn't have it. - She reports numbness and tingling in her hands when she wakes up in the morning or in the evening after work. - She reports abnormal gait. It feels like her feet are stuck in cement. She has trouble lifting her feet. It causes hip pain. She trips over her feet. It is worse when there is an extreme change in temperature. She started having trouble in her mid-30s. Worse over the past 2 years. She reports a stabbing pain in her hips or knees. She reports previously being diagnosed with a pinched nerve in the back that caused a left foot dropin the 20s.She had PT and injections which was ineffective. No numbness in the legs.  - When she throws a ball, her hand doesn't release immediately. There is a delay until she is able to release the ball. She has similar problems with her left hand. No neck pain. - She reports fatigued. She needs to take a nap during the day. Prior sleep study was normal.  One of  her twosistershas multiple sclerosis.  Other history: She has Crohn's disease. Earlier this year, she  developed a DVT in her right leg. She also was found to have B12 deficiency and is being treated with injections.  01/09/18 LABS: B12 143, Sed rate 18. Hypercoagulable panel unremarkable, including lupus anticoagulant panel, dRVVT mix, beta-2-glycoprotein, cardiolipin antibodies, prothrombin gene mutation, factor V Leiden. NMO/AQP4+ antibody was negative.Her insurance would not cover anti-MOGantibody testing.  09/2018 LABS:JC Virus antibody was positive with index of 3.26.Quantiferon-TB Gold Plus negative.She tested negative for Hep B.   07/22/2018: MRI Cervical &Thoracic spine w/wo contrast: Multiple T2/STIR hyperintense signal within the spinal cord at C2, C3-C4, C6-C7, T1, T3-T4, T6-T7, and T12 levels, all non-enhancing. She also demonstrates degenerative spinal stenosis at C4-C5 and C5-C6 with mild spinal cord mass effect. 07/24/2018: MRI Brain w/wo contrast: Mild hyperintensein the periventricular and juxtacortical white matter withsignal running perpendicular from the lateral ventricle and involving the corpus callosum, non-enhancing. 04/05/2020 MRI CERVICAL & THORACIC SPINE W WO:  Chronic patchy multifocal cord signal abnormality throughout cervical and thoracic cord, overall stable compared to 2019.  No active lesions.  Degenerative cervical spondylosis at C4-5 and C5-6 with mild to moderate spinal stenosis and moderate left C5 foraminal narrowing.  Swelling in ankles.  Past medications:  gabapesntin   PAST MEDICAL HISTORY: Past Medical History:  Diagnosis Date  . Anemia    related to Crohns flares  . Arm DVT (deep venous thromboembolism), acute, right ([place]) 11/29/2017  . Arthritis    knees, feet, hands, wrists, related to Crohns flare  . Asthma    Childhood  . B12 deficiency    monitored/treated by Dr. Carlean Purl  . Cervical dysplasia   . Crohn's disease of small intestine ([place]) 06/24/2012   Diagnosed 1999, in [place]. Ileitis only then. Treated with Imuran  Remicade and prednisone.  Noncompliant with therapy. 2004 return to care and was treated with Remicade prednisone Pentasa Cipro and Flagyl. 2006 status post right hemicolectomy. Subsequently treated with azathioprine and Cimzia. 200 mg every other week.  azathioprine was added in 2010. Prometheus TP MT enzyme was negative.  . Depression   . DVT of upper extremity (deep vein thrombosis) ([place]) 11/28/2017  . Eczema    arms and behind knees, worse in winter  . Generalized anxiety disorder   . History of recurrent UTI (urinary tract infection)   . HPV (human papilloma virus) infection   . Hyperlipemia   . Hypertriglyceridemia 07/03/2012   10/2010 labs showed level of 753 with total cholesterol 180 and HDL 41' Labs 05/2013--TG 180   . Major depressive disorder in remission ([place]) 10/03/2013  . Multiple sclerosis ([place]) 2019   Officially diagnosed in the Fall  . Neuromuscular disorder ([place])   . Osteopenia 09/29/2011   T-1.6  . Papanicolaou smear 12/12   last abnormal 2011  . Pulmonary embolism ([place]) 11/30/2017  . Scaphoid fracture of wrist 2013   left  . Seasonal allergic rhinitis   . Shingles 09/2015   R hip  . Vitamin B12 deficiency 06/19/2012  . Vitamin D deficiency 12/16/2015   Vitamin D-OH level 12     MEDICATIONS: Current Outpatient Medications on File Prior to Visit  Medication Sig Dispense Refill  . acetaminophen (TYLENOL) 500 MG tablet Take 1,000 mg by mouth every 6 (six) hours as needed for headache (pain). Reported on 12/15/2015    . baclofen (LIORESAL) 10 MG tablet TAKE 1 TABLET(10 MG) BY MOUTH THREE TIMES DAILY AS NEEDED FOR MUSCLE SPASMS 90 tablet  0  . buPROPion (WELLBUTRIN SR) 100 MG 12 hr tablet Take 1 tablet (100 mg total) by mouth 2 (two) times daily. 180 tablet 3  . cetirizine (ZYRTEC) 10 MG tablet Take 10 mg by mouth at bedtime.     . CHOLECALCIFEROL PO Take 5,000 Units by mouth 2 (two) times daily.    . colestipol (COLESTID) 5 g packet TAKE 5 GRAMS BY MOUTH DAILY 30 each  5  . cyanocobalamin (,VITAMIN B-12,) 1000 MCG/ML injection Inject 1 mL (1,000 mcg total) into the skin every 14 (fourteen) days. 10 mL 2  . dalfampridine 10 MG TB12 Take by mouth 2 (two) times daily.     Marland Kitchen dicyclomine (BENTYL) 10 MG capsule Take 1 capsule (10 mg total) by mouth every 6 (six) hours as needed for spasms (abd pain/cramping). 90 capsule 4  . diphenhydrAMINE (BENADRYL) 25 MG tablet Take 25 mg by mouth at bedtime as needed for sleep.     Marland Kitchen ELIQUIS 5 MG TABS tablet TAKE 1 TABLET(5 MG) BY MOUTH TWICE DAILY 60 tablet 5  . escitalopram (LEXAPRO) 10 MG tablet TAKE 1 TABLET(10 MG) BY MOUTH AT BEDTIME 90 tablet 3  . fenofibrate 160 MG tablet TAKE 1 TABLET BY MOUTH AT  BEDTIME 90 tablet 1  . Insulin Syringe 27G X 1/2" 1 ML MISC 1 each by Does not apply route every 14 (fourteen) days. For [place] B12 injection 24 each 1  . levonorgestrel (MIRENA) 20 MCG/24HR IUD 1 each by Intrauterine route once. Implanted April 2017    . NON FORMULARY Take 1 each by mouth at bedtime.    Marland Kitchen ocrelizumab 600 mg in sodium chloride 0.9 % 500 mL Inject 600 mg into the vein every 6 (six) months.    Marland Kitchen OVER THE COUNTER MEDICATION Apply 1 application topically daily as needed (arthritis pain). CBD Oil    . vedolizumab (ENTYVIO) 300 MG injection Inject 300 mg into the vein every 28 (twenty-eight) days. 300 each 6   No current facility-administered medications on file prior to visit.    ALLERGIES: Allergies  Allergen Reactions  . Iron Other (See Comments)    Blood in stool/Rectal Bleeding  . Venofer [Iron Sucrose] Other (See Comments)    Fever, infusion reaction   . Ibuprofen Other (See Comments)    Avoids due to Crohns disease  . Morphine And Related Itching    itchy  . Prednisone Other (See Comments)    "crazy", hallucinations Tolerates methylprednisolone  . Adhesive [Tape] Rash    Rash with electrodes    FAMILY HISTORY: Family History  Problem Relation Age of Onset  . Hypertension Mother   . Breast cancer  Mother 73  . Bone cancer Mother        metastatic from breast  . Depression Mother   . Hypothyroidism Mother   . Colon polyps Father   . Diabetes Father        borderline--resolved 2020  . Hypertension Father        off meds now  . Hyperlipidemia Father        off meds now  . Fuch's dystrophy Father   . Colon cancer Paternal Grandfather   . Multiple sclerosis Sister        affecting brain only  . Alcohol abuse Sister   . Fuch's dystrophy Sister   . Stroke Maternal Grandmother   . Lung cancer Maternal Grandmother   . Hypertension Maternal Grandmother   . Heart disease Paternal Grandmother   . Hypertension  Paternal Grandmother   . Hypertension Sister     SOCIAL HISTORY: Social History   Socioeconomic History  . Marital status: Married    Spouse name: Lindsay Mccarthy  . Number of children: 0  . Years of education: 60  . Highest education level: Associate degree: occupational, Hotel manager, or vocational program  Occupational History  . 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 Smoking status: Former Smoker    Packs/day: 1.00    Years: 4.00    Pack years: 4.00    Quit date: 11/18/1998    Years since quitting: 21.9  . Smokeless tobacco: Never Used  Vaping Use  . Vaping Use: Never used  Substance and Sexual Activity  . Alcohol use: No    Alcohol/week: 0.0 standard drinks  . Drug use: No  . Sexual activity: Yes    Partners: Male    Birth control/protection: I.U.D.    Comment: Mirena inserted 01/2016  Other Topics Concern  . Not on file  Social History Narrative   The patient is divorced.     Re-married Lindsay Mccarthy, partner of 10 years, on 10/10/16. 2 dogs, 1 cat   No children - doesn't want any   Curator for [employer].   Moved from [place] to [place] in 2013.   Past smoker   No alcohol   2-3 caffeinated beverages a day   She reports she is compliant with sunscreen given her increased risk of sun damage and skin  cancer (no longer on azathioprine)      Updated 12/24/19   Social Determinants of Health   Financial Resource Strain: Not on file  Food Insecurity: Not on file  Transportation Needs: Not on file  Physical Activity: Not on file  Stress: Not on file  Social Connections: Not on file  Intimate Partner Violence: Not on file     Objective:  Blood pressure 104/61, pulse 94, height 5' 4"  (1.626 m), weight 225 lb 12.8 oz (102.4 kg), SpO2 98 %. General: No acute distress.  Patient appears well-groomed.   Head:  Normocephalic/atraumatic Eyes:  Fundi examined but not visualized Neck: supple, no paraspinal tenderness, full range of motion Heart:  Regular rate and rhythm Lungs:  Clear to auscultation bilaterally Back: No paraspinal tenderness Neurological Exam: alert and oriented to person, place, and time. Attention span and concentration intact, recent and remote memory intact, fund of knowledge intact.  Speech fluent and not dysarthric, language intact.  CN II-XII intact. Bulk normal, increased tone in lower extremities, muscle strength 5/5 throughout.  Sensation to pinprick reduced in right upper and lower extremities and reduced vibratory sensation in right lower extremity.  Deep tendon reflexes 2+ throughout, bilateral Babinski.  Finger to nose testing intact.  Spastic gait. Timed 25 foot walk 8.44 seconds.  Romberg with mild sway.   Assessment/Plan:   Multiple sclerosis  1.  DMT:  Ocrevus 2.  D3 5000 IU BID, baclofen, Ampyra 3.  Check quantitative Ig panel and vit D in 6 months 4.  Follow up in 6 months (after repeat labs)  Lindsay Clines, DO  CC: Rita Ohara, MD

## 2020-10-13 ENCOUNTER — Other Ambulatory Visit: Payer: Self-pay

## 2020-10-13 ENCOUNTER — Encounter: Payer: Self-pay | Admitting: Neurology

## 2020-10-13 ENCOUNTER — Ambulatory Visit (INDEPENDENT_AMBULATORY_CARE_PROVIDER_SITE_OTHER): Payer: 59 | Admitting: Neurology

## 2020-10-13 VITALS — BP 104/61 | HR 94 | Ht 64.0 in | Wt 225.8 lb

## 2020-10-13 DIAGNOSIS — G35 Multiple sclerosis: Secondary | ICD-10-CM | POA: Diagnosis not present

## 2020-10-13 NOTE — Patient Instructions (Signed)
1.  Continue Ocrevus, D3, Ampyra, baclofen 2.  Check IgG/IgA/IgM panel and vit D in 6 months 3.  Follow up in 6 months (after repeat labs)

## 2020-10-15 ENCOUNTER — Other Ambulatory Visit: Payer: Self-pay | Admitting: Family Medicine

## 2020-10-15 DIAGNOSIS — F324 Major depressive disorder, single episode, in partial remission: Secondary | ICD-10-CM

## 2020-10-31 ENCOUNTER — Other Ambulatory Visit: Payer: Self-pay | Admitting: Family Medicine

## 2020-10-31 DIAGNOSIS — F324 Major depressive disorder, single episode, in partial remission: Secondary | ICD-10-CM

## 2020-11-05 ENCOUNTER — Other Ambulatory Visit: Payer: Self-pay | Admitting: Neurology

## 2020-11-13 ENCOUNTER — Other Ambulatory Visit: Payer: Self-pay | Admitting: Family Medicine

## 2020-11-13 DIAGNOSIS — F324 Major depressive disorder, single episode, in partial remission: Secondary | ICD-10-CM

## 2020-11-14 NOTE — Telephone Encounter (Signed)
[facility] is requesting to fill pt lexapro. Please advise [facility]

## 2020-12-03 ENCOUNTER — Other Ambulatory Visit: Payer: Self-pay | Admitting: Family Medicine

## 2020-12-03 DIAGNOSIS — Z1231 Encounter for screening mammogram for malignant neoplasm of breast: Secondary | ICD-10-CM

## 2020-12-08 ENCOUNTER — Other Ambulatory Visit: Payer: Self-pay | Admitting: *Deleted

## 2020-12-08 DIAGNOSIS — D500 Iron deficiency anemia secondary to blood loss (chronic): Secondary | ICD-10-CM

## 2020-12-08 DIAGNOSIS — E538 Deficiency of other specified B group vitamins: Secondary | ICD-10-CM

## 2020-12-08 DIAGNOSIS — D509 Iron deficiency anemia, unspecified: Secondary | ICD-10-CM

## 2020-12-09 ENCOUNTER — Telehealth: Payer: Self-pay | Admitting: Hematology

## 2020-12-09 NOTE — Telephone Encounter (Signed)
Scheduled apt per 2/7 sch msg - pt is aware of appt date and time

## 2020-12-10 ENCOUNTER — Telehealth: Payer: Self-pay | Admitting: Neurology

## 2020-12-12 ENCOUNTER — Other Ambulatory Visit: Payer: Self-pay

## 2020-12-12 ENCOUNTER — Inpatient Hospital Stay: Payer: 59 | Attending: Hematology

## 2020-12-12 DIAGNOSIS — Z800 Family history of malignant neoplasm of digestive organs: Secondary | ICD-10-CM | POA: Insufficient documentation

## 2020-12-12 DIAGNOSIS — Z885 Allergy status to narcotic agent status: Secondary | ICD-10-CM | POA: Insufficient documentation

## 2020-12-12 DIAGNOSIS — Z801 Family history of malignant neoplasm of trachea, bronchus and lung: Secondary | ICD-10-CM | POA: Insufficient documentation

## 2020-12-12 DIAGNOSIS — Z6841 Body Mass Index (BMI) 40.0 and over, adult: Secondary | ICD-10-CM | POA: Insufficient documentation

## 2020-12-12 DIAGNOSIS — Z87440 Personal history of urinary (tract) infections: Secondary | ICD-10-CM | POA: Diagnosis not present

## 2020-12-12 DIAGNOSIS — Z86718 Personal history of other venous thrombosis and embolism: Secondary | ICD-10-CM | POA: Diagnosis present

## 2020-12-12 DIAGNOSIS — D75839 Thrombocytosis, unspecified: Secondary | ICD-10-CM | POA: Insufficient documentation

## 2020-12-12 DIAGNOSIS — Z886 Allergy status to analgesic agent status: Secondary | ICD-10-CM | POA: Insufficient documentation

## 2020-12-12 DIAGNOSIS — Z79899 Other long term (current) drug therapy: Secondary | ICD-10-CM | POA: Insufficient documentation

## 2020-12-12 DIAGNOSIS — D509 Iron deficiency anemia, unspecified: Secondary | ICD-10-CM | POA: Diagnosis present

## 2020-12-12 DIAGNOSIS — Z793 Long term (current) use of hormonal contraceptives: Secondary | ICD-10-CM | POA: Diagnosis not present

## 2020-12-12 DIAGNOSIS — K5090 Crohn's disease, unspecified, without complications: Secondary | ICD-10-CM | POA: Diagnosis not present

## 2020-12-12 DIAGNOSIS — D500 Iron deficiency anemia secondary to blood loss (chronic): Secondary | ICD-10-CM

## 2020-12-12 DIAGNOSIS — Z8371 Family history of colonic polyps: Secondary | ICD-10-CM | POA: Insufficient documentation

## 2020-12-12 DIAGNOSIS — Z818 Family history of other mental and behavioral disorders: Secondary | ICD-10-CM | POA: Diagnosis not present

## 2020-12-12 DIAGNOSIS — E538 Deficiency of other specified B group vitamins: Secondary | ICD-10-CM

## 2020-12-12 DIAGNOSIS — Z811 Family history of alcohol abuse and dependence: Secondary | ICD-10-CM | POA: Diagnosis not present

## 2020-12-12 DIAGNOSIS — Z888 Allergy status to other drugs, medicaments and biological substances status: Secondary | ICD-10-CM | POA: Insufficient documentation

## 2020-12-12 DIAGNOSIS — Z87891 Personal history of nicotine dependence: Secondary | ICD-10-CM | POA: Insufficient documentation

## 2020-12-12 DIAGNOSIS — Z833 Family history of diabetes mellitus: Secondary | ICD-10-CM | POA: Insufficient documentation

## 2020-12-12 DIAGNOSIS — Z803 Family history of malignant neoplasm of breast: Secondary | ICD-10-CM | POA: Diagnosis not present

## 2020-12-12 DIAGNOSIS — Z9049 Acquired absence of other specified parts of digestive tract: Secondary | ICD-10-CM | POA: Insufficient documentation

## 2020-12-12 DIAGNOSIS — Z86711 Personal history of pulmonary embolism: Secondary | ICD-10-CM | POA: Diagnosis present

## 2020-12-12 DIAGNOSIS — Z83438 Family history of other disorder of lipoprotein metabolism and other lipidemia: Secondary | ICD-10-CM | POA: Diagnosis not present

## 2020-12-12 DIAGNOSIS — Z7901 Long term (current) use of anticoagulants: Secondary | ICD-10-CM | POA: Diagnosis not present

## 2020-12-12 DIAGNOSIS — Z823 Family history of stroke: Secondary | ICD-10-CM | POA: Insufficient documentation

## 2020-12-12 LAB — CMP (CANCER CENTER ONLY)
ALT: 19 U/L (ref 0–44)
AST: 20 U/L (ref 15–41)
Albumin: 3.4 g/dL — ABNORMAL LOW (ref 3.5–5.0)
Alkaline Phosphatase: 57 U/L (ref 38–126)
Anion gap: 7 (ref 5–15)
BUN: 13 mg/dL (ref 6–20)
CO2: 22 mmol/L (ref 22–32)
Calcium: 8.4 mg/dL — ABNORMAL LOW (ref 8.9–10.3)
Chloride: 108 mmol/L (ref 98–111)
Creatinine: 0.78 mg/dL (ref 0.44–1.00)
GFR, Estimated: >60 mL/min (ref >60)
Glucose, Bld: 86 mg/dL (ref 70–99)
Potassium: 4 mmol/L (ref 3.5–5.1)
Sodium: 137 mmol/L (ref 135–145)
Total Bilirubin: 0.2 mg/dL — ABNORMAL LOW (ref 0.3–1.2)
Total Protein: 7 g/dL (ref 6.5–8.1)

## 2020-12-12 LAB — CBC WITH DIFFERENTIAL (CANCER CENTER ONLY)
Abs Immature Granulocytes: 0.02 10*3/uL (ref 0.00–0.07)
Basophils Absolute: 0 10*3/uL (ref 0.0–0.1)
Basophils Relative: 0 %
Eosinophils Absolute: 0.1 10*3/uL (ref 0.0–0.5)
Eosinophils Relative: 2 %
HCT: 30.8 % — ABNORMAL LOW (ref 36.0–46.0)
Hemoglobin: 9.1 g/dL — ABNORMAL LOW (ref 12.0–15.0)
Immature Granulocytes: 0 %
Lymphocytes Relative: 14 %
Lymphs Abs: 1 10*3/uL (ref 0.7–4.0)
MCH: 22.6 pg — ABNORMAL LOW (ref 26.0–34.0)
MCHC: 29.5 g/dL — ABNORMAL LOW (ref 30.0–36.0)
MCV: 76.4 fL — ABNORMAL LOW (ref 80.0–100.0)
Monocytes Absolute: 0.8 10*3/uL (ref 0.1–1.0)
Monocytes Relative: 12 %
Neutro Abs: 4.9 10*3/uL (ref 1.7–7.7)
Neutrophils Relative %: 72 %
Platelet Count: 446 10*3/uL — ABNORMAL HIGH (ref 150–400)
RBC: 4.03 MIL/uL (ref 3.87–5.11)
RDW: 15 % (ref 11.5–15.5)
WBC Count: 6.9 10*3/uL (ref 4.0–10.5)
nRBC: 0 % (ref 0.0–0.2)

## 2020-12-12 LAB — IRON AND TIBC
Iron: 20 ug/dL — ABNORMAL LOW (ref 41–142)
Saturation Ratios: 3 % — ABNORMAL LOW (ref 21–57)
TIBC: 635 ug/dL — ABNORMAL HIGH (ref 236–444)
UIBC: 615 ug/dL — ABNORMAL HIGH (ref 120–384)

## 2020-12-12 LAB — FERRITIN: Ferritin: <4 ng/mL — ABNORMAL LOW (ref 11–307)

## 2020-12-12 LAB — VITAMIN B12: Vitamin B-12: 314 pg/mL (ref 180–914)

## 2020-12-17 NOTE — Progress Notes (Signed)
HEMATOLOGY/ONCOLOGY CONSULTATION NOTE  Date of Service: 12/18/2020  Patient Care Team: Rita Ohara, MD as PCP - General (Family Medicine) Pieter Partridge, DO as Consulting Physician (Neurology) Yisroel Ramming, Everardo All, MD as Consulting Physician (Obstetrics and Gynecology)  CHIEF COMPLAINTS/PURPOSE OF CONSULTATION:  F/u for Acute DVT and PE  HISTORY OF PRESENTING ILLNESS:   Lindsay Mccarthy is a wonderful 45 y.o. female who has been referred to [place] by Dr. Tomi Bamberger, her PCP, for evaluation and management of DVT and PE.  Patient reported a significant h/o Crohns disease and noted that she noticed on 11/26/17 that her b/l thighs were heavy swelling and painful. She initially did not have SOB. She presented to the hospital on 11/27/2017 with a sudden onset of right leg swelling for 2 days. She is also been having some pleuritic chest pain and shortness of breath upon exertion. 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 The following day she returned and had AngioJet thrombectomy and balloon venoplasty. She had residual thrombus within her inferior vena cava. Following her procedure, the pain in her right leg had almost completely resolved. Unfortunately, she began developing shortness of breath and was confirmed to have worsening PE by CT angiogram. This required thrombolysis and IVC filter placement.   She had a superficial blood clot in her left calf in 07/2017. She was not on Cimzia at that time. Blood clot went away on it's own. Red "streaking" of the leg and her the pain went away in 6 weeks.  She has been on 333m of aspirin after her superficial blood clot. She was placed on eliquis since her acute DVT and PE.   She denies family history of blood clots. She had not been traveling around that time and was being more active. She was on Birth  control in the past and blood clots never occurred. She was never pregnant. Her previous stricture resection surgery did not lead to a blood clot.   She was diagnosed with Crohn's disease when she was on 146 She reviewed her medication for this. She had a stricture resected 12 years ago. She denies having IV iron or blood transfusions. She has not had a period in 8 years. She is currently on Mirena. Prior to did not have menorrhagia. Last colonoscopy was fall 2018.   On review of symptoms, pt notes she has rarely has GI bleeding. Breathing is back to baseline, only some shortness of breath upon significant exertion.    INTERVAL HISTORY  I connected with PIleene Hutchinson on 12/18/2020 by telephone and verified that I am speaking with the correct person using two identifiers.   I discussed the limitations of evaluation and management by telemedicine. The patient expressed understanding and agreed to proceed.   Other persons participating in the visit and their role in the encounter:                                                         - RReinaldo Raddle Medical Scribe     Patient's location: [place]'s [place] follow up of  her acute DVT and PE. The patient's last visit with [place] was on 08/07/2020. The pt reports that she is doing well overall.  The pt reports no new symptoms or concerns. She has not noticed any bleeding recently. She notes she tolerated the two iron injections well thus far, with no allergic reactions.  Lab results 12/12/2020 of CBC w/diff and CMP is as follows: all values are WNL except for Hgb of 9.1, HCT of 30.8, MCV of 76.4, MCH of 22.6, MCHC of 29.5, Plt of 446K, Calcium of 8.4, Albumin of 3.4, Total Bilirubin of <0.2.  12/12/2020 Vitamin B12 of 314. 12/12/2020 Ferritin of <4. 12/12/2020 Iron of 20, TIBC of 635, Sat Ratio of 3, UIBC of 615.  On review of systems, pt denies blood in stools, changes in bowel  habits, and any other symptoms.  MEDICAL HISTORY:  Past Medical History:  Diagnosis Date  . Anemia    related to Crohns flares  . Arm DVT (deep venous thromboembolism), acute, right ([place]) 11/29/2017  . Arthritis    knees, feet, hands, wrists, related to Crohns flare  . Asthma    Childhood  . B12 deficiency    monitored/treated by Dr. Carlean Purl  . Cervical dysplasia   . Crohn's disease of small intestine ([place]) 06/24/2012   Diagnosed 1999, in [place]. Ileitis only then. Treated with Imuran Remicade and prednisone.  Noncompliant with therapy. 2004 return to care and was treated with Remicade prednisone Pentasa Cipro and Flagyl. 2006 status post right hemicolectomy. Subsequently treated with azathioprine and Cimzia. 200 mg every other week.  azathioprine was added in 2010. Prometheus TP MT enzyme was negative.  . Depression   . DVT of upper extremity (deep vein thrombosis) ([place]) 11/28/2017  . Eczema    arms and behind knees, worse in winter  . Generalized anxiety disorder   . History of recurrent UTI (urinary tract infection)   . HPV (human papilloma virus) infection   . Hyperlipemia   . Hypertriglyceridemia 07/03/2012   10/2010 labs showed level of 753 with total cholesterol 180 and HDL 41' Labs 05/2013--TG 180   . Major depressive disorder in remission ([place]) 10/03/2013  . Multiple sclerosis ([place]) 2019   Officially diagnosed in the Fall  . Neuromuscular disorder ([place])   . Osteopenia 09/29/2011   T-1.6  . Papanicolaou smear 12/12   last abnormal 2011  . Pulmonary embolism ([place]) 11/30/2017  . Scaphoid fracture of wrist 2013   left  . Seasonal allergic rhinitis   . Shingles 09/2015   R hip  . Vitamin B12 deficiency 06/19/2012  . Vitamin D deficiency 12/16/2015   Vitamin D-OH level 12     SURGICAL HISTORY: Past Surgical History:  Procedure Laterality Date  . APPENDECTOMY    . CERVICAL BIOPSY  W/ LOOP ELECTRODE EXCISION  2009   ---paps normal since  . COLONOSCOPY  multiple    scanned  . ESOPHAGOGASTRODUODENOSCOPY  multiple   scanned  . HEMICOLECTOMY  2006  . IR ANGIOGRAM PULMONARY BILATERAL SELECTIVE  12/03/2017  . IR ANGIOGRAM SELECTIVE EACH ADDITIONAL VESSEL  12/03/2017  . IR ANGIOGRAM SELECTIVE EACH ADDITIONAL VESSEL  12/03/2017  . IR INFUSION THROMBOL ARTERIAL INITIAL (MS)  12/03/2017  . IR INFUSION THROMBOL ARTERIAL INITIAL (MS)  12/03/2017  . IR IVC FILTER PLMT / S&I /IMG GUID/MOD SED  12/04/2017  . IR THROMB F/U EVAL ART/VEN FINAL DAY (MS)  12/04/2017  . IR US GUIDE VASC ACCESS RIGHT  12/03/2017  . IVC FILTER REMOVAL N/A 07/11/2018  Procedure: IVC FILTER REMOVAL;  Surgeon: Serafina Mitchell, MD;  Location: [facility];  Service: Cardiovascular;  Laterality: N/A;  . IVC VENOGRAPHY N/A 07/11/2018   Procedure: IVC Venography;  Surgeon: Serafina Mitchell, MD;  Location: [facility];  Service: Cardiovascular;  Laterality: N/A;  . LEEP  2011   --done in [place]  . PERIPHERAL VASCULAR BALLOON ANGIOPLASTY Right 11/30/2017   Procedure: PERIPHERAL VASCULAR BALLOON ANGIOPLASTY;  Surgeon: Serafina Mitchell, MD;  Location: [facility];  Service: Cardiovascular;  Laterality: Right;  . PERIPHERAL VASCULAR THROMBECTOMY N/A 11/29/2017   Procedure: PERIPHERAL VASCULAR THROMBECTOMY - THROMBOLYSIS;  Surgeon: Serafina Mitchell, MD;  Location: [facility];  Service: Cardiovascular;  Laterality: N/A;  LYSIS CATHETER PLACEMENT ONLY  . PERIPHERAL VASCULAR THROMBECTOMY N/A 11/30/2017   Procedure: PERIPHERAL VASCULAR THROMBECTOMY - Lysis Recheck;  Surgeon: Serafina Mitchell, MD;  Location: [facility];  Service: Cardiovascular;  Laterality: N/A;  . WISDOM TOOTH EXTRACTION      SOCIAL HISTORY: Social History   Socioeconomic History  . Marital status: Married    Spouse name: Marcello Moores  . Number of children: 0  . Years of education: 22  . Highest education level: Associate degree: occupational, Hotel manager, or vocational program  Occupational History  . Occupation:  Hydrographic surveyor: Theme park manager  Tobacco Use  . Smoking status: Former Smoker    Packs/day: 1.00    Years: 4.00    Pack years: 4.00    Quit date: 11/18/1998    Years since quitting: 22.0  . Smokeless tobacco: Never Used  Vaping Use  . Vaping Use: Never used  Substance and Sexual Activity  . Alcohol use: No    Alcohol/week: 0.0 standard drinks  . Drug use: No  . Sexual activity: Yes    Partners: Male    Birth control/protection: I.U.D.    Comment: Mirena inserted 01/2016  Other Topics Concern  . Not on file  Social History Narrative   The patient is divorced.     Re-married Marcello Moores, partner of 10 years, on 10/10/16. 2 dogs, 1 cat   No children - doesn't want any   Curator for [employer].   Moved from [place] to [place] in 2013.   Past smoker   No alcohol   2-3 caffeinated beverages a day   She reports she is compliant with sunscreen given her increased risk of sun damage and skin cancer (no longer on azathioprine)      Updated 12/24/19   Social Determinants of Health   Financial Resource Strain: Not on file  Food Insecurity: Not on file  Transportation Needs: Not on file  Physical Activity: Not on file  Stress: Not on file  Social Connections: Not on file  Intimate Partner Violence: Not on file    FAMILY HISTORY: Family History  Problem Relation Age of Onset  . Hypertension Mother   . Breast cancer Mother 7  . Bone cancer Mother        metastatic from breast  . Depression Mother   . Hypothyroidism Mother   . Colon polyps Father   . Diabetes Father        borderline--resolved 2020  . Hypertension Father        off meds now  . Hyperlipidemia Father        off meds now  . Fuch's dystrophy Father   . Colon cancer Paternal Grandfather   . Multiple sclerosis  Sister        affecting brain only  . Alcohol abuse Sister   . Fuch's dystrophy Sister   . Stroke Maternal Grandmother   . Lung cancer Maternal Grandmother    . Hypertension Maternal Grandmother   . Heart disease Paternal Grandmother   . Hypertension Paternal Grandmother   . Hypertension Sister     ALLERGIES:  is allergic to iron, venofer [iron sucrose], ibuprofen, morphine and related, prednisone, and adhesive [tape].  MEDICATIONS:  Current Outpatient Medications  Medication Sig Dispense Refill  . dalfampridine 10 MG TB12 TAKE 1 TABLET BY MOUTH  EVERY 12 HOURS 60 tablet 5  . acetaminophen (TYLENOL) 500 MG tablet Take 1,000 mg by mouth every 6 (six) hours as needed for headache (pain). Reported on 12/15/2015    . baclofen (LIORESAL) 10 MG tablet TAKE 1 TABLET(10 MG) BY MOUTH THREE TIMES DAILY AS NEEDED FOR MUSCLE SPASMS 90 tablet 0  . buPROPion (WELLBUTRIN SR) 100 MG 12 hr tablet TAKE 1 TABLET BY MOUTH  TWICE DAILY 180 tablet 0  . cetirizine (ZYRTEC) 10 MG tablet Take 10 mg by mouth at bedtime.     . CHOLECALCIFEROL PO Take 5,000 Units by mouth 2 (two) times daily.    . colestipol (COLESTID) 5 g packet TAKE 5 GRAMS BY MOUTH DAILY 30 each 5  . cyanocobalamin (,VITAMIN B-12,) 1000 MCG/ML injection Inject 1 mL (1,000 mcg total) into the skin every 14 (fourteen) days. 10 mL 2  . dicyclomine (BENTYL) 10 MG capsule Take 1 capsule (10 mg total) by mouth every 6 (six) hours as needed for spasms (abd pain/cramping). 90 capsule 4  . diphenhydrAMINE (BENADRYL) 25 MG tablet Take 25 mg by mouth at bedtime as needed for sleep.     Marland Kitchen ELIQUIS 5 MG TABS tablet TAKE 1 TABLET(5 MG) BY MOUTH TWICE DAILY 60 tablet 5  . escitalopram (LEXAPRO) 10 MG tablet TAKE 1 TABLET BY MOUTH AT  BEDTIME 90 tablet 0  . fenofibrate 160 MG tablet TAKE 1 TABLET BY MOUTH AT  BEDTIME 90 tablet 1  . Insulin Syringe 27G X 1/2" 1 ML MISC 1 each by Does not apply route every 14 (fourteen) days. For [place] B12 injection 24 each 1  . levonorgestrel (MIRENA) 20 MCG/24HR IUD 1 each by Intrauterine route once. Implanted April 2017    . NON FORMULARY Take 1 each by mouth at bedtime.    Marland Kitchen ocrelizumab  600 mg in sodium chloride 0.9 % 500 mL Inject 600 mg into the vein every 6 (six) months.    Marland Kitchen OVER THE COUNTER MEDICATION Apply 1 application topically daily as needed (arthritis pain). CBD Oil    . vedolizumab (ENTYVIO) 300 MG injection Inject 300 mg into the vein every 28 (twenty-eight) days. 300 each 6   No current facility-administered medications for this visit.    REVIEW OF SYSTEMS:   10 Point review of Systems was done is negative except as noted above.   PHYSICAL EXAMINATION: ECOG PERFORMANCE STATUS: 1 - Symptomatic but completely ambulatory  Telehealth Visit.  LABORATORY DATA:  I have reviewed the data as listed  . CBC Latest Ref Rng & Units 12/12/2020 08/07/2020 07/16/2020  WBC 4.0 - 10.5 K/uL 6.9 9.0 10.3  Hemoglobin 12.0 - 15.0 g/dL 9.1(L) 10.1(L) 10.8(L)  Hematocrit 36.0 - 46.0 % 30.8(L) 33.2(L) 34.4(L)  Platelets 150 - 400 K/uL 446(H) 474(H) 491.0(H)   CBC    Component Value Date/Time   WBC 6.9 12/12/2020 0752   WBC  9.0 08/07/2020 1020   RBC 4.03 12/12/2020 0752   HGB 9.1 (L) 12/12/2020 0752   HGB 13.1 12/18/2019 0820   HCT 30.8 (L) 12/12/2020 0752   HCT 40.7 12/18/2019 0820   PLT 446 (H) 12/12/2020 0752   PLT 475 (H) 12/18/2019 0820   MCV 76.4 (L) 12/12/2020 0752   MCV 90 12/18/2019 0820   MCH 22.6 (L) 12/12/2020 0752   MCHC 29.5 (L) 12/12/2020 0752   RDW 15.0 12/12/2020 0752   RDW 13.0 12/18/2019 0820   LYMPHSABS 1.0 12/12/2020 0752   LYMPHSABS 1.1 12/18/2019 0820   MONOABS 0.8 12/12/2020 0752   EOSABS 0.1 12/12/2020 0752   EOSABS 0.1 12/18/2019 0820   BASOSABS 0.0 12/12/2020 0752   BASOSABS 0.0 12/18/2019 0820    CMP Latest Ref Rng & Units 12/12/2020 07/16/2020 05/07/2020  Glucose 70 - 99 mg/dL 86 95 82  BUN 6 - 20 mg/dL 13 14 9   Creatinine 0.44 - 1.00 mg/dL 0.78 0.99 0.83  Sodium 135 - 145 mmol/L 137 139 141  Potassium 3.5 - 5.1 mmol/L 4.0 3.4(L) 4.0  Chloride 98 - 111 mmol/L 108 103 107  CO2 22 - 32 mmol/L 22 27 23   Calcium 8.9 - 10.3 mg/dL  8.4(L) 8.7 8.7(L)  Total Protein 6.5 - 8.1 g/dL 7.0 7.1 6.9  Total Bilirubin 0.3 - 1.2 mg/dL 0.2(L) 0.2 <0.2(L)  Alkaline Phos 38 - 126 U/L 57 46 58  AST 15 - 41 U/L 20 15 16   ALT 0 - 44 U/L 19 17 19    Component     Latest Ref Rng & Units 01/09/2018  PTT Lupus Anticoagulant     0.0 - 51.9 sec 34.3  DRVVT     0.0 - 47.0 sec 53.9 (H)  Lupus Anticoag Interp      Comment:  Beta-2 Glycoprotein I Ab, IgG     0 - 20 GPI IgG units 9  Beta-2-Glycoprotein I IgM     0 - 32 GPI IgM units <9  Beta-2-Glycoprotein I IgA     0 - 25 GPI IgA units <9  Anticardiolipin Ab,IgG,Qn     0 - 14 GPL U/mL <9  Anticardiolipin Ab,IgM,Qn     0 - 12 MPL U/mL 13 (H)  Anticardiolipin Ab,IgA,Qn     0 - 11 APL U/mL <9    Lupus anticoagulant panel      The value has a corrected status.      No reference range information available      Resulting Lab: [facility]      Comments:           (NOTE)           No lupus anticoagulant was detected  RADIOGRAPHIC STUDIES: I have personally reviewed the radiological images as listed and agreed with the findings in the report. No results found.  ASSESSMENT & PLAN:   CIERRIA HEIGHT is a 45 y.o. caucasian female with   1. Acute DVT and Submassive PE No definitive provocation of DVT. PE likely from dislodged DVT after mechanical thrombectomy and balloon venoplasty Has IVC filter placed.  On Eliquis since 12/2017 Non smoker. No recent use of hormonal contracept. Prothrombin gene mutation/Factor V leiden mutation negative, Anti-phoshopholipid ab negative. Jak2 testing results negative. Lupus negative. Based on results, elevated platelets likely had a reactive cause due to inflammation from Crohn's and elevated iron.   There is no height or weight on file to calculate BMI. - obesity could an additional  contributing risk factor. Chronic GI bleeding with upregulation of FVIII could have a role in risk of VTE as well No overt focal symptoms  suggestive of malignancy.  2. Elevated platelets  Have been elevated 300-800K for over a year  -likely reactive to iron deficiency anemia and inflammation from Crohn's disease  3. Iron Deficiency Anemia . Lab Results  Component Value Date   IRON 20 (L) 12/12/2020   TIBC 635 (H) 12/12/2020   IRONPCTSAT 3 (L) 12/12/2020   (Iron and TIBC)  Lab Results  Component Value Date   FERRITIN <4 (L) 12/12/2020  -on ferrous sulfate BID. Tolerating well -continue PO ironto maintain ferritin levels >50. -if still low in 3 months would consider IV iron replacement.  4. Vitamin B12 deficiency  -seen at 4709 on 01/09/18 labs  -Currently on monthly B12 injections with PCP, will continue    5. Crohn's Disease -Mx by Dr. Carlean Purl -On Imuran and Entyvio   PLAN:  -Discussed pt labs, 12/12/2020; still very iron deficient. Anemia milder, but Hgb decreased. Plt running higher. -Advised pt we will need to get more IV Iron ASAP. Pt has a deficit of 1.5 grams still.  -Advised pt that insurance did not allow Injectafer. Discussed potential of Iron Dextran, but concern over allergic reaction due to pt's history of allergies and intolerance of steroids for pre-medications. (Pt noted she tolerated Solu-Medrol prior) The pt wishes to try Iron Dextran. -Advised pt she may still be experiencing GI bleeding despite not being visible to her.  -Continue 1000 mcg Vitamin B12 [place] every 2 weeks. -Continue Eliquis  FOLLOW UP:  Will schedule for IV Dextran infusion in 1-2 weeks RTC with Dr Irene Limbo with labs in 3 months   The total time spent in the appointment was 20 minutes and more than 50% was on counseling and direct patient cares.  All of the patient's questions were answered with apparent satisfaction. The patient knows to call the clinic with any problems, questions or concerns.    Sullivan Lone MD [place] AAHIVMS [facility] Hematology/Oncology Physician [facility]  (Office):        [phone] (Work cell):  [phone] (Fax):           [phone]  12/18/2020 12:12 PM  I, Reinaldo Raddle, am acting as scribe for Dr. Sullivan Lone, MD.    .I have reviewed the above documentation for accuracy and completeness, and I agree with the above. Brunetta Genera MD

## 2020-12-18 ENCOUNTER — Inpatient Hospital Stay (HOSPITAL_BASED_OUTPATIENT_CLINIC_OR_DEPARTMENT_OTHER): Payer: 59 | Admitting: Hematology

## 2020-12-18 ENCOUNTER — Other Ambulatory Visit: Payer: Self-pay

## 2020-12-18 ENCOUNTER — Encounter: Payer: Self-pay | Admitting: Neurology

## 2020-12-18 ENCOUNTER — Other Ambulatory Visit (INDEPENDENT_AMBULATORY_CARE_PROVIDER_SITE_OTHER): Payer: 59

## 2020-12-18 ENCOUNTER — Ambulatory Visit (INDEPENDENT_AMBULATORY_CARE_PROVIDER_SITE_OTHER): Payer: 59 | Admitting: Neurology

## 2020-12-18 VITALS — BP 108/76 | HR 91 | Ht 64.0 in | Wt 223.2 lb

## 2020-12-18 DIAGNOSIS — G35 Multiple sclerosis: Secondary | ICD-10-CM

## 2020-12-18 DIAGNOSIS — D500 Iron deficiency anemia secondary to blood loss (chronic): Secondary | ICD-10-CM

## 2020-12-18 DIAGNOSIS — E538 Deficiency of other specified B group vitamins: Secondary | ICD-10-CM | POA: Diagnosis not present

## 2020-12-18 DIAGNOSIS — D509 Iron deficiency anemia, unspecified: Secondary | ICD-10-CM | POA: Diagnosis not present

## 2020-12-18 LAB — VITAMIN D 25 HYDROXY (VIT D DEFICIENCY, FRACTURES): VITD: 48.07 ng/mL (ref 30.00–100.00)

## 2020-12-18 NOTE — Patient Instructions (Addendum)
Check immunoglobulin panel and vit D Follow up 06/15/2021 as scheduled.

## 2020-12-18 NOTE — Progress Notes (Signed)
NEUROLOGY FOLLOW UP OFFICE NOTE  Lindsay Mccarthy [id number]   Subjective:  Lindsay Mccarthy a 45 year old right-handed woman with Crohn's disease, depression, and anxiety who follows up for multiple sclerosis. She is accompanied by her husband who supplements history.    UPDATE: Current disease modifying therapy: Ocrevus(first dose on2/14/20, followed by the second dose on 12/29/18. Last infusion in early October).  Other medication: [id number] IUBID, B12 injections,Ampyra, baclofen,Wellbutrin, Lexapro 68m, ,Colestid, Bentayl, Benadryl, Eliquis, Mirena, Entyvio  Presents to go over [employer]    Overall, she is feeling well.  Symptoms are stable.  Notes some exacerbation of weakness with heat. Vision:No issues Motor: No new issues Sensory: numbness andtinglingin legs  Pain: Spasms in legs.. Gait: Gait improved since restarting Ampyra.  Now usually ambulates without cane unless outside. Bowel/Bladder: Occasional urinary incontinence when stressed. Crohn's disease. Fatigue: Some fatigue. Cognition:Trouble multi-tasking.She underwent neuropsychological testing on 09/04/2019. Performance fell largely within normal limits. She did exhibit a weakness across aspects of attention/concentration and some performance variability across processing speed. Performance involving complex attention, executive functioning, receptive and expressive language, visual-spatial abilities and verbal and visual learning and memory were within normal range. Mood: Stable  HISTORY: She reports history of various neurologic symptoms since her late 225s - Urinary incontinence: It started about a year ago and has gotten worse.It occurs suddenly, once or twice a month, usually at home and while standing, such as while washing dishes with the water running. It occurs during fatigued oremotional stress as well. She denies increased urinary frequency or dysuria. No perineal  numbness. No low back pain. - She reports poor night vision with difficulty seeing while driving at night.She has trouble focusing. Recent eye exam showed poor "refocusing", which would normally be seen in an older adult. Her sister had Fuchs dystrophy but she doesn't have it. - She reports numbness and tingling in her hands when she wakes up in the morning or in the evening after work. - She reports abnormal gait. It feels like her feet are stuck in cement. She has trouble lifting her feet. It causes hip pain. She trips over her feet. It is worse when there is an extreme change in temperature. She started having trouble in her mid-30s. Worse over the past 2 years. She reports a stabbing pain in her hips or knees. She reports previously being diagnosed with a pinched nerve in the back that caused a left foot dropin the 20s.She had PT and injections which was ineffective. No numbness in the legs.  - When she throws a ball, her hand doesn't release immediately. There is a delay until she is able to release the ball. She has similar problems with her left hand. No neck pain. - She reports fatigued. She needs to take a nap during the day. Prior sleep study was normal.  One of her twosistershas multiple sclerosis.  Other history: She has Crohn's disease. Earlier this year, she developed a DVT in her right leg. She also was found to have B12 deficiency and is being treated with injections.  01/09/18 LABS: B12 143, Sed rate 18. Hypercoagulable panel unremarkable, including lupus anticoagulant panel, dRVVT mix, beta-2-glycoprotein, cardiolipin antibodies, prothrombin gene mutation, factor V Leiden. NMO/AQP4+ antibody was negative.Her insurance would not cover anti-MOGantibody testing.  09/2018 LABS:JC Virus antibody was positive with index of 3.26.Quantiferon-TB Gold Plus negative.She tested negative for Hep B.   07/22/2018: MRI Cervical &Thoracic spine  w/wo contrast: Multiple T2/STIR hyperintense signal within the spinal cord at C2, C3-C4, C6-C7, T1,  T3-T4, T6-T7, and T12 levels, all non-enhancing. She also demonstrates degenerative spinal stenosis at C4-C5 and C5-C6 with mild spinal cord mass effect. 07/24/2018: MRI Brain w/wo contrast: Mild hyperintensein the periventricular and juxtacortical white matter withsignal running perpendicular from the lateral ventricle and involving the corpus callosum, non-enhancing. 04/05/2020 MRI CERVICAL &THORACIC SPINE W WO: Chronic patchy multifocal cord signal abnormality throughout cervical and thoracic cord, overall stable compared to 2019. No active lesions. Degenerative cervical spondylosis at C4-5 and C5-6 with mild to moderate spinal stenosis and moderate left C5 foraminal narrowing. Swelling in ankles. 05/02/2020 MRI BRAIN W WO:  Unchanged mild subcortical/juxtacortical and deep periventricular white matter foci of signal abnormality consistent with the provided history of multiple sclerosis. No new or enhancing lesion is identified.  Past medications:  gabapesntin   PAST MEDICAL HISTORY: Past Medical History:  Diagnosis Date  . Anemia    related to Crohns flares  . Arm DVT (deep venous thromboembolism), acute, right ([place]) 11/29/2017  . Arthritis    knees, feet, hands, wrists, related to Crohns flare  . Asthma    Childhood  . B12 deficiency    monitored/treated by Dr. Carlean Purl  . Cervical dysplasia   . Crohn's disease of small intestine ([place]) 06/24/2012   Diagnosed 1999, in [place]. Ileitis only then. Treated with Imuran Remicade and prednisone.  Noncompliant with therapy. 2004 return to care and was treated with Remicade prednisone Pentasa Cipro and Flagyl. 2006 status post right hemicolectomy. Subsequently treated with azathioprine and Cimzia. 200 mg every other week.  azathioprine was added in 2010. Prometheus TP MT enzyme was negative.  . Depression   . DVT of upper extremity (deep  vein thrombosis) ([place]) 11/28/2017  . Eczema    arms and behind knees, worse in winter  . Generalized anxiety disorder   . History of recurrent UTI (urinary tract infection)   . HPV (human papilloma virus) infection   . Hyperlipemia   . Hypertriglyceridemia 07/03/2012   10/2010 labs showed level of 753 with total cholesterol 180 and HDL 41' Labs 05/2013--TG 180   . Major depressive disorder in remission ([place]) 10/03/2013  . Multiple sclerosis ([place]) 2019   Officially diagnosed in the Fall  . Neuromuscular disorder ([place])   . Osteopenia 09/29/2011   T-1.6  . Papanicolaou smear 12/12   last abnormal 2011  . Pulmonary embolism ([place]) 11/30/2017  . Scaphoid fracture of wrist 2013   left  . Seasonal allergic rhinitis   . Shingles 09/2015   R hip  . Vitamin B12 deficiency 06/19/2012  . Vitamin D deficiency 12/16/2015   Vitamin D-OH level 12     MEDICATIONS: Current Outpatient Medications on File Prior to Visit  Medication Sig Dispense Refill  . dalfampridine 10 MG TB12 TAKE 1 TABLET BY MOUTH  EVERY 12 HOURS 60 tablet 5  . acetaminophen (TYLENOL) 500 MG tablet Take 1,000 mg by mouth every 6 (six) hours as needed for headache (pain). Reported on 12/15/2015    . baclofen (LIORESAL) 10 MG tablet TAKE 1 TABLET(10 MG) BY MOUTH THREE TIMES DAILY AS NEEDED FOR MUSCLE SPASMS 90 tablet 0  . buPROPion (WELLBUTRIN SR) 100 MG 12 hr tablet TAKE 1 TABLET BY MOUTH  TWICE DAILY 180 tablet 0  . cetirizine (ZYRTEC) 10 MG tablet Take 10 mg by mouth at bedtime.     . CHOLECALCIFEROL PO Take 5,000 Units by mouth 2 (two) times daily.    . colestipol (COLESTID) 5 g packet TAKE 5 GRAMS BY MOUTH DAILY 30  each 5  . cyanocobalamin (,VITAMIN B-12,) 1000 MCG/ML injection Inject 1 mL (1,000 mcg total) into the skin every 14 (fourteen) days. 10 mL 2  . dicyclomine (BENTYL) 10 MG capsule Take 1 capsule (10 mg total) by mouth every 6 (six) hours as needed for spasms (abd pain/cramping). 90 capsule 4  . diphenhydrAMINE  (BENADRYL) 25 MG tablet Take 25 mg by mouth at bedtime as needed for sleep.     Marland Kitchen ELIQUIS 5 MG TABS tablet TAKE 1 TABLET(5 MG) BY MOUTH TWICE DAILY 60 tablet 5  . escitalopram (LEXAPRO) 10 MG tablet TAKE 1 TABLET BY MOUTH AT  BEDTIME 90 tablet 0  . fenofibrate 160 MG tablet TAKE 1 TABLET BY MOUTH AT  BEDTIME 90 tablet 1  . Insulin Syringe 27G X 1/2" 1 ML MISC 1 each by Does not apply route every 14 (fourteen) days. For Vincent B12 injection 24 each 1  . levonorgestrel (MIRENA) 20 MCG/24HR IUD 1 each by Intrauterine route once. Implanted April 2017    . NON FORMULARY Take 1 each by mouth at bedtime.    Marland Kitchen ocrelizumab 600 mg in sodium chloride 0.9 % 500 mL Inject 600 mg into the vein every 6 (six) months.    Marland Kitchen OVER THE COUNTER MEDICATION Apply 1 application topically daily as needed (arthritis pain). CBD Oil    . vedolizumab (ENTYVIO) 300 MG injection Inject 300 mg into the vein every 28 (twenty-eight) days. 300 each 6   No current facility-administered medications on file prior to visit.    ALLERGIES: Allergies  Allergen Reactions  . Iron Other (See Comments)    Blood in stool/Rectal Bleeding  . Venofer [Iron Sucrose] Other (See Comments)    Fever, infusion reaction   . Ibuprofen Other (See Comments)    Avoids due to Crohns disease  . Morphine And Related Itching    itchy  . Prednisone Other (See Comments)    "crazy", hallucinations Tolerates methylprednisolone  . Adhesive [Tape] Rash    Rash with electrodes    FAMILY HISTORY: Family History  Problem Relation Age of Onset  . Hypertension Mother   . Breast cancer Mother 58  . Bone cancer Mother        metastatic from breast  . Depression Mother   . Hypothyroidism Mother   . Colon polyps Father   . Diabetes Father        borderline--resolved 2020  . Hypertension Father        off meds now  . Hyperlipidemia Father        off meds now  . Fuch's dystrophy Father   . Colon cancer Paternal Grandfather   . Multiple sclerosis Sister         affecting brain only  . Alcohol abuse Sister   . Fuch's dystrophy Sister   . Stroke Maternal Grandmother   . Lung cancer Maternal Grandmother   . Hypertension Maternal Grandmother   . Heart disease Paternal Grandmother   . Hypertension Paternal Grandmother   . Hypertension Sister     SOCIAL HISTORY: Social History   Socioeconomic History  . Marital status: Married    Spouse name: Marcello Moores  . Number of children: 0  . Years of education: 74  . Highest education level: Associate degree: occupational, Hotel manager, or vocational program  Occupational History  . 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 Smoking status: Former Smoker    Packs/day: 1.00    Years: 4.00  Pack years: 4.00    Quit date: 11/18/1998    Years since quitting: 22.0  . Smokeless tobacco: Never Used  Vaping Use  . Vaping Use: Never used  Substance and Sexual Activity  . Alcohol use: No    Alcohol/week: 0.0 standard drinks  . Drug use: No  . Sexual activity: Yes    Partners: Male    Birth control/protection: I.U.D.    Comment: Mirena inserted 01/2016  Other Topics Concern  . Not on file  Social History Narrative   The patient is divorced.     Re-married Marcello Moores, partner of 10 years, on 10/10/16. 2 dogs, 1 cat   No children - doesn't want any   Curator for [employer].   Moved from [place] to [place] in 2013.   Past smoker   No alcohol   2-3 caffeinated beverages a day   She reports she is compliant with sunscreen given her increased risk of sun damage and skin cancer (no longer on azathioprine)      Updated 12/24/19   Social Determinants of Health   Financial Resource Strain: Not on file  Food Insecurity: Not on file  Transportation Needs: Not on file  Physical Activity: Not on file  Stress: Not on file  Social Connections: Not on file  Intimate Partner Violence: Not on file     Objective:  Blood pressure 108/76, pulse 91,  height 5' 4"  (1.626 m), weight 223 lb 3.2 oz (101.2 kg), SpO2 99 %. General: No acute distress.  Patient appears well-groomed.     Assessment/Plan:   Multiple sclerosis  1.  FMLA completed 2.  DMT:  De Nurse (next infusion in April) 3.  D3 5000 IU BID, baclofen, Ampyra 4.  Check quantitative Ig panel and vit D 5.  Follow up in August as already scheduled.  Metta Clines, DO  CC: Rita Ohara, MD

## 2020-12-19 LAB — IGG, IGA, IGM
IgG (Immunoglobin G), Serum: 1026 mg/dL (ref 600–1640)
IgM, Serum: 35 mg/dL — ABNORMAL LOW (ref 50–300)
Immunoglobulin A: 155 mg/dL (ref 47–310)

## 2020-12-19 NOTE — Progress Notes (Signed)
Disability forms filled and fax per pt request.

## 2020-12-19 NOTE — Progress Notes (Signed)
Pt advised of her lab results.

## 2020-12-22 ENCOUNTER — Inpatient Hospital Stay: Payer: 59

## 2020-12-22 ENCOUNTER — Other Ambulatory Visit: Payer: Self-pay

## 2020-12-22 VITALS — BP 115/68 | HR 86 | Temp 98.7°F | Resp 18

## 2020-12-22 DIAGNOSIS — D509 Iron deficiency anemia, unspecified: Secondary | ICD-10-CM | POA: Diagnosis not present

## 2020-12-22 DIAGNOSIS — D500 Iron deficiency anemia secondary to blood loss (chronic): Secondary | ICD-10-CM

## 2020-12-22 MED ORDER — ACETAMINOPHEN 325 MG PO TABS
650.0000 mg | ORAL_TABLET | Freq: Once | ORAL | Status: AC
  Administered 2020-12-22: 650 mg via ORAL

## 2020-12-22 MED ORDER — LORATADINE 10 MG PO TABS
ORAL_TABLET | ORAL | Status: AC
  Filled 2020-12-22: qty 1

## 2020-12-22 MED ORDER — FAMOTIDINE IN NACL 20-0.9 MG/50ML-% IV SOLN
INTRAVENOUS | Status: AC
  Filled 2020-12-22: qty 50

## 2020-12-22 MED ORDER — FAMOTIDINE IN NACL 20-0.9 MG/50ML-% IV SOLN
20.0000 mg | Freq: Once | INTRAVENOUS | Status: AC
  Administered 2020-12-22: 20 mg via INTRAVENOUS

## 2020-12-22 MED ORDER — ACETAMINOPHEN 325 MG PO TABS
ORAL_TABLET | ORAL | Status: AC
  Filled 2020-12-22: qty 2

## 2020-12-22 MED ORDER — SODIUM CHLORIDE 0.9 % IV SOLN
125.0000 mg | Freq: Once | INTRAVENOUS | Status: AC
  Administered 2020-12-22: 125 mg via INTRAVENOUS
  Filled 2020-12-22: qty 10

## 2020-12-22 MED ORDER — LORATADINE 10 MG PO TABS
10.0000 mg | ORAL_TABLET | Freq: Once | ORAL | Status: AC
  Administered 2020-12-22: 10 mg via ORAL

## 2020-12-22 MED ORDER — SODIUM CHLORIDE 0.9 % IV SOLN
Freq: Once | INTRAVENOUS | Status: AC
  Filled 2020-12-22: qty 250

## 2020-12-22 NOTE — Progress Notes (Signed)
Pt refused 30 minute observation, pt educated. VSS upon exiting.

## 2020-12-22 NOTE — Patient Instructions (Signed)
Sodium Ferric Gluconate Complex injection What is this medicine? SODIUM FERRIC GLUCONATE COMPLEX (SOE dee um FER ik GLOO koe nate KOM pleks) is an iron replacement. It is used with epoetin therapy to treat low iron levels in patients who are receiving hemodialysis. This medicine may be used for other purposes; ask your health care provider or pharmacist if you have questions. COMMON BRAND NAME(S): Ferrlecit, Nulecit What should I tell my health care provider before I take this medicine? They need to know if you have any of the following conditions:  anemia that is not from iron deficiency  high levels of iron in the body  an unusual or allergic reaction to iron, benzyl alcohol, other medicines, foods, dyes, or preservatives  pregnant or are trying to become pregnant  breast-feeding How should I use this medicine? This medicine is for infusion into a vein. It is given by a health care professional in a hospital or clinic setting. Talk to your pediatrician regarding the use of this medicine in children. While this drug may be prescribed for children as young as 6 years old for selected conditions, precautions do apply. Overdosage: If you think you have taken too much of this medicine contact a poison control center or emergency room at once. NOTE: This medicine is only for you. Do not share this medicine with others. What if I miss a dose? It is important not to miss your dose. Call your doctor or health care professional if you are unable to keep an appointment. What may interact with this medicine? Do not take this medicine with any of the following medications:  deferoxamine  dimercaprol  other iron products This medicine may also interact with the following medications:  chloramphenicol  deferasirox  medicine for blood pressure like enalapril This list may not describe all possible interactions. Give your health care provider a list of all the medicines, herbs,  non-prescription drugs, or dietary supplements you use. Also tell them if you smoke, drink alcohol, or use illegal drugs. Some items may interact with your medicine. What should I watch for while using this medicine? Your condition will be monitored carefully while you are receiving this medicine. Visit your doctor for check-ups as directed. What side effects may I notice from receiving this medicine? Side effects that you should report to your doctor or health care professional as soon as possible:  allergic reactions like skin rash, itching or hives, swelling of the face, lips, or tongue  breathing problems  changes in hearing  changes in vision  chills, flushing, or sweating  fast, irregular heartbeat  feeling faint or lightheaded, falls  fever, flu-like symptoms  high or low blood pressure  pain, tingling, numbness in the hands or feet  severe pain in the chest, back, flanks, or groin  swelling of the ankles, feet, hands  trouble passing urine or change in the amount of urine  unusually weak or tired Side effects that usually do not require medical attention (report to your doctor or health care professional if they continue or are bothersome):  cramps  dark colored stools  diarrhea  headache  nausea, vomiting  stomach upset This list may not describe all possible side effects. Call your doctor for medical advice about side effects. You may report side effects to FDA at 1-800-FDA-1088. Where should I keep my medicine? This drug is given in a hospital or clinic and will not be stored at home. NOTE: This sheet is a summary. It may not cover all   possible information. If you have questions about this medicine, talk to your doctor, pharmacist, or health care provider.  2021 Elsevier/Gold Standard (2008-06-19 15:58:57)   

## 2020-12-23 ENCOUNTER — Ambulatory Visit (INDEPENDENT_AMBULATORY_CARE_PROVIDER_SITE_OTHER): Payer: 59 | Admitting: Internal Medicine

## 2020-12-23 ENCOUNTER — Encounter: Payer: Self-pay | Admitting: Internal Medicine

## 2020-12-23 ENCOUNTER — Telehealth: Payer: Self-pay

## 2020-12-23 ENCOUNTER — Encounter: Payer: Self-pay | Admitting: Family Medicine

## 2020-12-23 VITALS — BP 118/72 | HR 84 | Ht 64.0 in | Wt 223.8 lb

## 2020-12-23 DIAGNOSIS — Z79899 Other long term (current) drug therapy: Secondary | ICD-10-CM | POA: Diagnosis not present

## 2020-12-23 DIAGNOSIS — K50012 Crohn's disease of small intestine with intestinal obstruction: Secondary | ICD-10-CM

## 2020-12-23 DIAGNOSIS — D500 Iron deficiency anemia secondary to blood loss (chronic): Secondary | ICD-10-CM

## 2020-12-23 DIAGNOSIS — Z7901 Long term (current) use of anticoagulants: Secondary | ICD-10-CM

## 2020-12-23 DIAGNOSIS — D509 Iron deficiency anemia, unspecified: Secondary | ICD-10-CM | POA: Diagnosis not present

## 2020-12-23 DIAGNOSIS — Z7960 Long term (current) use of unspecified immunomodulators and immunosuppressants: Secondary | ICD-10-CM

## 2020-12-23 NOTE — Patient Instructions (Signed)
You have been scheduled for an endoscopy and colonoscopy. Please follow the written instructions given to you at your visit today. Please pick up your prep supplies at the pharmacy within the next 1-3 days. If you use inhalers (even only as needed), please bring them with you on the day of your procedure.   Due to recent changes in healthcare laws, you may see the results of your imaging and laboratory studies on MyChart before your provider has had a chance to review them.  We understand that in some cases there may be results that are confusing or concerning to you. Not all laboratory results come back in the same time frame and the provider may be waiting for multiple results in order to interpret others.  Please give [place] 48 hours in order for your provider to thoroughly review all the results before contacting the office for clarification of your results.    I appreciate the opportunity to care for you. Silvano Rusk, MD, [facility]

## 2020-12-23 NOTE — Progress Notes (Signed)
Lindsay Mccarthy 45 y.o. 04/13/1976 [id number]  Assessment & Plan:   Iron deficiency anemia due to chronic blood loss Needs reassessment with EGD and colonoscopy. The risks and benefits as well as alternatives of endoscopic procedure(s) have been discussed and reviewed. All questions answered. The patient agrees to proceed. If these are negative need to consider capsule endoscopy the small intestine.  Would consider fecal calprotectin prior to that.  Am more suspicious of uncontrolled Crohn's the name of AVMs.  Crohn's disease of small intestine ([place]) Symptomatically well on Entyvio.  Continue every 4 weeks.  Anticoagulant long-term use Apixaban, will hold 2 days prior to colonoscopy clarify with prescriber.  Rare but real risk of clot off medication explained and patient understands.  Long-term anticoagulation also increases risk of bleeding and contributing to iron deficiency as well even in the face of minor inflammation or lesions.     Subjective:   Chief Complaint: Iron deficiency anemia  HPI Lindsay Mccarthy is a patient with multiple sclerosis and Crohn's disease treated with Entyvio who also has a history of iron deficiency anemia thought due to chronic blood loss related to Crohn's disease.  She has had recurrence of that and Dr. Irene Limbo has suggested she see me again regarding this so we had her come in.  She is not noting any bleeding and believes that the [facility] is working well on an every 4-week dosing regimen with respect to her Crohn's disease.  She is being treated with parenteral iron therapy. Allergies  Allergen Reactions   Iron Other (See Comments)    Blood in stool/Rectal Bleeding   Venofer [Iron Sucrose] Other (See Comments)    Fever, infusion reaction    Ibuprofen Other (See Comments)    Avoids due to Crohns disease   Morphine And Related Itching    itchy   Prednisone Other (See Comments)    "crazy", hallucinations Tolerates methylprednisolone   Adhesive  [Tape] Rash    Rash with electrodes   Current Meds  Medication Sig   acetaminophen (TYLENOL) 500 MG tablet Take 1,000 mg by mouth every 6 (six) hours as needed for headache (pain). Reported on 12/15/2015   buPROPion (WELLBUTRIN SR) 100 MG 12 hr tablet TAKE 1 TABLET BY MOUTH  TWICE DAILY   cetirizine (ZYRTEC) 10 MG tablet Take 10 mg by mouth at bedtime.    CHOLECALCIFEROL PO Take 5,000 Units by mouth 2 (two) times daily.   colestipol (COLESTID) 5 g packet TAKE 5 GRAMS BY MOUTH DAILY   cyanocobalamin (,VITAMIN B-12,) 1000 MCG/ML injection Inject 1 mL (1,000 mcg total) into the skin every 14 (fourteen) days.   dalfampridine 10 MG TB12 TAKE 1 TABLET BY MOUTH  EVERY 12 HOURS   dicyclomine (BENTYL) 10 MG capsule Take 1 capsule (10 mg total) by mouth every 6 (six) hours as needed for spasms (abd pain/cramping).   diphenhydrAMINE (BENADRYL) 25 MG tablet Take 25 mg by mouth at bedtime as needed for sleep.    ELIQUIS 5 MG TABS tablet TAKE 1 TABLET(5 MG) BY MOUTH TWICE DAILY   escitalopram (LEXAPRO) 10 MG tablet TAKE 1 TABLET BY MOUTH AT  BEDTIME   fenofibrate 160 MG tablet TAKE 1 TABLET BY MOUTH AT  BEDTIME   gabapentin (NEURONTIN) 100 MG capsule Take 100 mg by mouth at bedtime. Take two cap at bedtime   Insulin Syringe 27G X 1/2" 1 ML MISC 1 each by Does not apply route every 14 (fourteen) days. For [place] B12 injection   levonorgestrel (MIRENA)  20 MCG/24HR IUD 1 each by Intrauterine route once. Implanted April 2017   NON FORMULARY Take 1 each by mouth at bedtime.   ocrelizumab 600 mg in sodium chloride 0.9 % 500 mL Inject 600 mg into the vein every 6 (six) months.   OVER THE COUNTER MEDICATION Apply 1 application topically daily as needed (arthritis pain). CBD Oil   vedolizumab (ENTYVIO) 300 MG injection Inject 300 mg into the vein every 28 (twenty-eight) days.   Past Medical History:  Diagnosis Date   Anemia    related to Crohns flares   Arm DVT (deep venous thromboembolism),  acute, right ([place]) 11/29/2017   Arthritis    knees, feet, hands, wrists, related to Crohns flare   Asthma    Childhood   B12 deficiency    monitored/treated by Dr. Carlean Purl   Cervical dysplasia    Crohn's disease of small intestine ([place]) 06/24/2012   Diagnosed 1999, in [place]. Ileitis only then. Treated with Imuran Remicade and prednisone.  Noncompliant with therapy. 2004 return to care and was treated with Remicade prednisone Pentasa Cipro and Flagyl. 2006 status post right hemicolectomy. Subsequently treated with azathioprine and Cimzia. 200 mg every other week.  azathioprine was added in 2010. Prometheus TP MT enzyme was negative.   Depression    DVT of upper extremity (deep vein thrombosis) ([place]) 11/28/2017   Eczema    arms and behind knees, worse in winter   Generalized anxiety disorder    History of recurrent UTI (urinary tract infection)    HPV (human papilloma virus) infection    Hyperlipemia    Hypertriglyceridemia 07/03/2012   10/2010 labs showed level of 753 with total cholesterol 180 and HDL 41' Labs 05/2013--TG 180    Major depressive disorder in remission ([place]) 10/03/2013   Multiple sclerosis ([place]) 2019   Officially diagnosed in the Fall   Neuromuscular disorder ([place])    Osteopenia 09/29/2011   T-1.6   Papanicolaou smear 12/12   last abnormal 2011   Pulmonary embolism ([place]) 11/30/2017   Scaphoid fracture of wrist 2013   left   Seasonal allergic rhinitis    Shingles 09/2015   R hip   Vitamin B12 deficiency 06/19/2012   Vitamin D deficiency 12/16/2015   Vitamin D-OH level 12    Past Surgical History:  Procedure Laterality Date   APPENDECTOMY     CERVICAL BIOPSY  W/ LOOP ELECTRODE EXCISION  2009   ---paps normal since   COLONOSCOPY  multiple   scanned   ESOPHAGOGASTRODUODENOSCOPY  multiple   scanned   HEMICOLECTOMY  2006   IR ANGIOGRAM PULMONARY BILATERAL SELECTIVE  12/03/2017   IR ANGIOGRAM SELECTIVE EACH ADDITIONAL VESSEL   12/03/2017   IR ANGIOGRAM SELECTIVE EACH ADDITIONAL VESSEL  12/03/2017   IR INFUSION THROMBOL ARTERIAL INITIAL (MS)  12/03/2017   IR INFUSION THROMBOL ARTERIAL INITIAL (MS)  12/03/2017   IR IVC FILTER PLMT / S&I /IMG GUID/MOD SED  12/04/2017   IR THROMB F/U EVAL ART/VEN FINAL DAY (MS)  12/04/2017   IR US GUIDE VASC ACCESS RIGHT  12/03/2017   IVC FILTER REMOVAL N/A 07/11/2018   Procedure: IVC FILTER REMOVAL;  Surgeon: Serafina Mitchell, MD;  Location: [facility];  Service: Cardiovascular;  Laterality: N/A;   IVC VENOGRAPHY N/A 07/11/2018   Procedure: IVC Venography;  Surgeon: Serafina Mitchell, MD;  Location: [facility];  Service: Cardiovascular;  Laterality: N/A;   LEEP  2011   --done in [place]  ANGIOPLASTY Right 11/30/2017   Procedure: PERIPHERAL VASCULAR BALLOON ANGIOPLASTY;  Surgeon: Serafina Mitchell, MD;  Location: [facility];  Service: Cardiovascular;  Laterality: Right;   PERIPHERAL VASCULAR THROMBECTOMY N/A 11/29/2017   Procedure: PERIPHERAL VASCULAR THROMBECTOMY - THROMBOLYSIS;  Surgeon: Serafina Mitchell, MD;  Location: [facility];  Service: Cardiovascular;  Laterality: N/A;  LYSIS CATHETER PLACEMENT ONLY   PERIPHERAL VASCULAR THROMBECTOMY N/A 11/30/2017   Procedure: PERIPHERAL VASCULAR THROMBECTOMY - Lysis Recheck;  Surgeon: Serafina Mitchell, MD;  Location: [facility];  Service: Cardiovascular;  Laterality: N/A;   WISDOM TOOTH EXTRACTION     Social History   Social History Narrative   The patient is divorced.     Re-married Marcello Moores, partner of 10 years, on 10/10/16. 2 dogs, 1 cat   No children - doesn't want any   Curator for [employer].   Moved from [place] to [place] in 2013.   Past smoker   No alcohol   2-3 caffeinated beverages a day   She reports she is compliant with sunscreen given her increased risk of sun damage and skin cancer (no longer on azathioprine)      Updated  12/24/19   family history includes Alcohol abuse in her sister; Bone cancer in her mother; Breast cancer (age of onset: 47) in her mother; Colon cancer in her paternal grandfather; Colon polyps in her father; Depression in her mother; Diabetes in her father; Fuch's dystrophy in her father and sister; Heart disease in her paternal grandmother; Hyperlipidemia in her father; Hypertension in her father, maternal grandmother, mother, paternal grandmother, and sister; Hypothyroidism in her mother; Lung cancer in her maternal grandmother; Multiple sclerosis in her sister; Stroke in her maternal grandmother.   Review of Systems As per HPI, multiple sclerosis seems to be doing better. Objective:   Physical Exam BP 118/72    Pulse 84    Ht 5' 4"  (1.626 m)    Wt 223 lb 12.8 oz (101.5 kg)    BMI 38.42 kg/m  Well-developed well-nourished obese white woman in no acute distress Lungs are clear Heart sounds normal S1-S2 no rubs murmurs or gallops Abdomen soft nontender no organomegaly or mass she has well-healed surgical scars Alert and oriented x3 with appropriate mood and affect

## 2020-12-23 NOTE — Telephone Encounter (Signed)
[facility]  Pre-operative Risk Assessment     Request for surgical clearance:     Endoscopy Procedure  What type of surgery is being performed?     EGD & Colonoscopy  When is this surgery scheduled?     02/03/2021  What type of clearance is required ?   Pharmacy  Are there any medications that need to be held prior to surgery and how long? Eliquis, 2 days  Practice name and name of physician performing surgery?      [facility], Dr Silvano Rusk  What is your office phone and fax number?      Phone- [phone]  [phone]  Anesthesia type (None, local, MAC, general) ?       MAC

## 2020-12-26 ENCOUNTER — Other Ambulatory Visit: Payer: Self-pay | Admitting: Family Medicine

## 2020-12-26 DIAGNOSIS — F324 Major depressive disorder, single episode, in partial remission: Secondary | ICD-10-CM

## 2020-12-28 ENCOUNTER — Encounter: Payer: Self-pay | Admitting: Internal Medicine

## 2020-12-28 NOTE — Assessment & Plan Note (Signed)
Symptomatically well on Entyvio.  Continue every 4 weeks.

## 2020-12-28 NOTE — Assessment & Plan Note (Signed)
Apixaban, will hold 2 days prior to colonoscopy clarify with prescriber.  Rare but real risk of clot off medication explained and patient understands.  Long-term anticoagulation also increases risk of bleeding and contributing to iron deficiency as well even in the face of minor inflammation or lesions.

## 2020-12-28 NOTE — Assessment & Plan Note (Signed)
Needs reassessment with EGD and colonoscopy. The risks and benefits as well as alternatives of endoscopic procedure(s) have been discussed and reviewed. All questions answered. The patient agrees to proceed. If these are negative need to consider capsule endoscopy the small intestine.  Would consider fecal calprotectin prior to that.  Am more suspicious of uncontrolled Crohn's the name of AVMs.

## 2020-12-29 ENCOUNTER — Other Ambulatory Visit: Payer: Self-pay | Admitting: Family Medicine

## 2020-12-29 DIAGNOSIS — F324 Major depressive disorder, single episode, in partial remission: Secondary | ICD-10-CM

## 2020-12-29 NOTE — Telephone Encounter (Signed)
She has appt 3/2. She had this med refilled x 90d in early January.  Can address refills at her visit, shouldn't need yet.

## 2020-12-30 ENCOUNTER — Other Ambulatory Visit: Payer: Self-pay | Admitting: Family Medicine

## 2020-12-30 DIAGNOSIS — F324 Major depressive disorder, single episode, in partial remission: Secondary | ICD-10-CM

## 2020-12-30 NOTE — Telephone Encounter (Signed)
[facility] is requesting to fill pt wellbutrin. Please advise [facility]

## 2020-12-30 NOTE — Progress Notes (Signed)
Chief Complaint  Patient presents with  . Annual Exam    Fasting annual exam no pap-sees Dr. Quincy Simmonds. Did want to have her lipids checked (trigs) for her own knowledge as she has been working on it. Has form for work to be filled out.     Lindsay Mccarthy is a 45 y.o. female who presents for a complete physical.  She has been craving salt lately.  She has h/oDVT and PE, remains onEliquiswithout complication. She denies bleedingorbruising. Denies shortness of breath, chest pain or leg swelling.  Pt is under the care of Dr. Irene Limbo. She has iron deficiency anemia and elevated plt (felt to be reactive). She has continued iron deficiency, and was sent back to GI to discuss. Plan is for EGD and colonoscopy. It has always been felt likely to GI losses related to her Crohn's, but they want to rule out other causes. She feels that her Crohn's has been controlled, and has no known bleeding. She has been getting iron infusions, last was 12/22/20. Tomorrow is supposed to be a much larger iron infusion.  Lab Results  Component Value Date   WBC 6.9 12/12/2020   HGB 9.1 (L) 12/12/2020   HCT 30.8 (L) 12/12/2020   MCV 76.4 (L) 12/12/2020   PLT 446 (H) 12/12/2020   Lab Results  Component Value Date   IRON 20 (L) 12/12/2020   TIBC 635 (H) 12/12/2020   FERRITIN <4 (L) 12/12/2020   B12 deficiency--on injections every 2 weeks.  Last level was on the lower end, told to continue same dosing.  Lab Results  Component Value Date   VITAMINB12 314 12/12/2020   Crohn's disease--under the care of Dr. Carlean Purl, doing well on Entyvio q 4 weeks. Moves bowels 1-2x/day (3x if ate a fatty meal); no abdominal pain or bleeding or mucus in the stool.  Hypertriglyceridemia: She is compliant with medications(fenofibrate)and denies side effects.TG's have remained above goal. She has seen nutritionist, and was given info on healthier snacks. Shepreviously took omega-3 fish oil, which were stopped when anticoagulants  were started.  (had restarted them for a while, but was reminded to stop at her last visit here). She is on Weight Watchers, trying to limit her carbs (potatoes and rolled oats, trying to limit portions).  Eats out once a week, healthy choices, no fast food. She would like lipids checked again today--since fish oil was stopped, and to see how much her diet is helping.  Lab Results  Component Value Date   CHOL 150 06/30/2020   HDL 65 06/30/2020   LDLCALC 56 06/30/2020   TRIG 174 (H) 06/30/2020   CHOLHDL 2.3 06/30/2020    Depression follow-up:She remains onLexapro68m daily andWellbutrin SR1064mBID.She is compliant with her meds, and didn't tolerate the higher dose of wellbutrin in the past.  At her last visit she noted that the pharmacy switched generic for buproprion, and thinks it is working even better.  She reported in September feeling "back to my old self" and more optimistic. She continues to feel very good on these medications, no changes.  Vitamin D deficiency--found to be low at 21 in 09/2018.  She is currently taking 5000 IU twice daily, and had level rechecked 12/2020 and was normal at 48.  MS--last saw Dr. JaTomi Likensn 12/2020.  She continues on Ocrevus and Ampyra. She is back on gabapentin 20043mHS (restarted for the electric shocks in hands/legs at night).  No longer using baclofen (ran out, didn't seem to help much). Gait improved  on Ampyra.  Urinary incontinence has improved, occasional, related to stress.  Pulmonary nodule noted on CT 07/2018. Has been getting regular CT follow-ups (yearly x 2)--stable, no further f/u recommended.. She denies any cough, shortness of breath. Last CT was 08/2020: IMPRESSION: Tubular nodular density in the left lower lobe is stable dating back to January 2019 and is compatible with a benign nodule.  History of abnormal paps, HPV. Treated with LEEP in 2011.She is under the care of Dr. Quincy Simmonds for her GYN care, last seen 04/2020 for annual  exam, and had normal pelvic US in 05/2020.She had normal paps in 12/2017 and 04/2019, with no HPV detected.  She had herMirena IUD replaced in April 2017.She has never been pregnant, and doesn't want children. Proper Mirena position confirmed on [place] 05/2020.  Osteopenia:DEXA11/2014showedT -1.6. She had repeat study 10/2016, and had improved some.  She has since had Vitamin D deficiency diagnosed/treated. She takes 5000 IU BID.  She started [place] twice daily, as her dairy intake has decreased from prior. She uses 1/2 cup of almond milk daily, more sporadic with cottage cheese.   Immunization History  Administered Date(s) Administered  . Hepatitis A 06/29/2012, 01/04/2013  . Influenza Split 08/09/2012  . Influenza,inj,Quad PF,6+ Mos 09/18/2013, 10/07/2014, 12/15/2015, 08/02/2016, 10/05/2017, 07/18/2019  . PFIZER(Purple Top)SARS-COV-2 Vaccination 05/24/2020, 06/14/2020, 11/14/2020  . PPD Test 04/30/2013, 10/07/2014  . Pneumococcal Conjugate-13 04/12/2018  . Pneumococcal Polysaccharide-23 07/06/2012  . Tdap 12/15/2015   Last Pap smear: 04/2019, normal with no high risk HPV Last mammogram:12/2018, scheduled for later this month Last colonoscopy:06/2017, Dr. Carlean Purl (scheduled for April) Last DEXA:10/2016 Ophtho: yearly Dentist: twice yearly  Exercise: recumbent bike daily (most of the time), 5 minutes (legs can't tolerate more). She marches in place for 10-15 minutes daily. Only rarely uses hand weights. She sees dermatologist (Dr. Derrel Nip) regularly   [place], [place], [place] and [place] reviewed and updated  Outpatient Encounter Medications as of 12/31/2020  Medication Sig Note  . buPROPion (WELLBUTRIN SR) 100 MG 12 hr tablet TAKE 1 TABLET BY MOUTH  TWICE DAILY   . cetirizine (ZYRTEC) 10 MG tablet Take 10 mg by mouth at bedtime.    . CHOLECALCIFEROL PO Take 5,000 Units by mouth 2 (two) times daily.   . cyanocobalamin (,VITAMIN B-12,) 1000 MCG/ML injection Inject 1 mL (1,000 mcg total) into  the skin every 14 (fourteen) days.   Marland Kitchen dalfampridine 10 MG TB12 TAKE 1 TABLET BY MOUTH  EVERY 12 HOURS   . ELIQUIS 5 MG TABS tablet TAKE 1 TABLET(5 MG) BY MOUTH TWICE DAILY   . escitalopram (LEXAPRO) 10 MG tablet TAKE 1 TABLET BY MOUTH AT  BEDTIME   . fenofibrate 160 MG tablet TAKE 1 TABLET BY MOUTH AT  BEDTIME   . gabapentin (NEURONTIN) 100 MG capsule Take 100 mg by mouth at bedtime. Take two cap at bedtime 12/31/2020: Taking 261m qHS  . Insulin Syringe 27G X 1/2" 1 ML MISC 1 each by Does not apply route every 14 (fourteen) days. For [place] B12 injection   . levonorgestrel (MIRENA) 20 MCG/24HR IUD 1 each by Intrauterine route once. Implanted April 2017   . NON FORMULARY Take 1 each by mouth at bedtime. 07/03/2020: CBD and CANNABIS  . ocrelizumab 600 mg in sodium chloride 0.9 % 500 mL Inject 600 mg into the vein every 6 (six) months. 07/18/2019: Ocrevus Infusion q 6 months  . OVER THE COUNTER MEDICATION Apply 1 application topically daily as needed (arthritis pain). CBD Oil   . vedolizumab (  ENTYVIO) 300 MG injection Inject 300 mg into the vein every 28 (twenty-eight) days.   Marland Kitchen acetaminophen (TYLENOL) 500 MG tablet Take 1,000 mg by mouth every 6 (six) hours as needed for headache (pain). Reported on 12/15/2015 (Patient not taking: Reported on 12/31/2020)   . colestipol (COLESTID) 5 g packet TAKE 5 GRAMS BY MOUTH DAILY (Patient not taking: Reported on 12/31/2020) 05/07/2020: Takes as needed  . dicyclomine (BENTYL) 10 MG capsule Take 1 capsule (10 mg total) by mouth every 6 (six) hours as needed for spasms (abd pain/cramping). (Patient not taking: Reported on 12/31/2020)   . diphenhydrAMINE (BENADRYL) 25 MG tablet Take 25 mg by mouth at bedtime as needed for sleep.  (Patient not taking: Reported on 12/31/2020)    No facility-administered encounter medications on file as of 12/31/2020.   Allergies  Allergen Reactions  . Iron Other (See Comments)    Blood in stool/Rectal Bleeding  . Venofer [Iron Sucrose] Other (See  Comments)    Fever, infusion reaction   . Ibuprofen Other (See Comments)    Avoids due to Crohns disease  . Morphine And Related Itching    itchy  . Prednisone Other (See Comments)    "crazy", hallucinations Tolerates methylprednisolone  . Adhesive [Tape] Rash    Rash with electrodes    ROS: The patient denies anorexia, fever, headaches, vision changes, decreased hearing, ear pain, sore throat, breast concerns, chest pain, palpitations, dizziness, syncope, cough, nausea, vomiting, indigestion/heartburn, hematuria, dysuria, vaginal bleeding,discharge, odor or itch, genital lesions, numbness, tingling, tremor, suspicious skin lesions, abnormal bleeding/bruising or enlarged lymph nodes. Has Mirena, rarely sees any bleeding/spotting. Crohn'sis controlled, no bleeding.  Chronic allergies, controlled by OTC meds.  Gait instability and incontinence per HPI, improved on current MS treatments. Some trouble seeing at night, better since dogs out of the bed, and the marijuana helps. No joint pains (slight in hip and knees, weather related), and achey when due for next Ocrevus.  Depression is controlled +bruising Rare tingling in fingers, usually when also feeling electric shocks in legs, better since restarting gabapentin.    PHYSICAL EXAM:  BP 120/76   Pulse 84   Ht 5' 4"  (1.626 m)   Wt 224 lb (101.6 kg)   BMI 38.45 kg/m   Wt Readings from Last 3 Encounters:  12/31/20 224 lb (101.6 kg)  12/23/20 223 lb 12.8 oz (101.5 kg)  12/18/20 223 lb 3.2 oz (101.2 kg)   General Appearance:   Alert, cooperative, no distress, appears stated age  Head:   Normocephalic, without obvious abnormality, atraumatic  Eyes:   PERRL, conjunctiva/corneas clear, EOM's intact, fundi benign  Ears:   Normal TM's and external ear canals  Nose:  Not examined, wearing mask due to COVID-19 pandemic  Throat:  Not examined, wearing mask due to COVID-19 pandemic  Neck:  Supple, no  lymphadenopathy; thyroid: no enlargement/tenderness/ nodules; no carotid bruit or JVD  Back:  Spine nontender, no curvature, ROM normal, noCVA tenderness.  Lungs:   Clear to auscultation bilaterally without wheezes, rales or ronchi; respirations unlabored  Chest Wall:   No tenderness or deformity  Heart:   Regular rate and rhythm, S1 and S2 normal, no murmur, rub or gallop  Breast Exam:   Deferred to GYN  Abdomen:   Soft, nondistended, normoactive bowel sounds, no masses, no hepatosplenomegaly. Nontender.  Genitalia:  deferred to GYN  Rectal:   deferred  Extremities:  No clubbing, cyanosis or edema.   Pulses:  2+ and symmetric all extremities  Skin:  Skin color, texture, turgor normal, no rashes or lesions. Exam is limited, not changed into gown for visit today.  Lymph nodes:  Cervical, supraclavicular, and axillary nodes normal  Neurologic:  Normal strength, sensation; reflexes 2+, somewhat hyperreflexic at L knee.    Psych: Normal mood, affect, hygiene and grooming  PHQ-9 score of 4 (some overeating, fatigue, and some trouble concentrating--thinks related to MS more than depression).    ASSESSMENT/PLAN:  Annual physical exam - Plan: POCT Urinalysis DIP (Proadvantage Device), Lipid panel, Glucose, random, Hepatitis C antibody  Vitamin D deficiency - adequately replaced per recent check; cont 5000 IU BID per Dr. Tomi Likens  Pulmonary nodule - stable per last CT 08/2020, no further monitoring indicated  Multiple sclerosis ([place]) - on treatment per Dr. Tomi Likens, doing well  Hypertriglyceridemia - mildly elevated on last check. Cont fenofibrate. To cont to avoid fish oil (bleeding risk, on anticoagulants). Cont lowfat diet.  - Plan: Lipid panel, fenofibrate 160 MG tablet  Crohn's disease of small intestine without complication ([place]) - per Dr. Carlean Purl, stable on current treatment. EGD and colonoscopy planned due to iron  deficiency anemia  Anticoagulant long-term use - for prior DVT/PE.  Denies any bleeding, though has iron deficiency anemia.  +bruising - Plan: apixaban (ELIQUIS) 5 MG TABS tablet  Long-term use of immunosuppressant medication Entyvio + ocrelizumab - She meets criteria to get 4th COVID dose/booster when time  Iron deficiency anemia due to chronic blood loss - doesn't seem to be responding to infusions; planning for EGD and colonoscopy. Infusion planned for tomorrow  Vitamin B12 deficiency - levels at low end of normal. Cont q2 week B12 injections per Dr. Irene Limbo  Osteopenia, unspecified location - Cont D, Ca, weight-bearing exercise. Consider repeat DEXA soon  Need for hepatitis C screening test - Plan: Hepatitis C antibody  BMI 38.0-38.9,adult - counseled re: healthy diet, portions, weight loss - Plan: Glucose, random  Major depression in remission ([facility]) - PHQ-9 score of 1 (improved since last visit); cont current med regimen and counseling - Plan: buPROPion (WELLBUTRIN SR) 100 MG 12 hr tablet, escitalopram (LEXAPRO) 10 MG tablet  History of pulmonary embolus (PE) - continue anticoagulants - Plan: apixaban (ELIQUIS) 5 MG TABS tablet   Hasn't had flu shot yet, related to timing windows with her medication infusions. Planning to get 3/18 at the pharmacy.  Discussed potential limited efficacy given late date. She continues to be careful in exposures, mask-wearing. Encouraged her to get earlier (Fall) next season.  Discussed monthly self breast exams and yearly mammograms; at least 30 minutes of aerobic activity at least 5 days/week including weight-bearing exercise at least twice per week; proper sunscreen use reviewed; healthy diet, including goals of calcium and vitamin D intake and alcohol recommendations (less than or equal to 1 drink/day) reviewed; regular seatbelt use; changing batteries in smoke detectors. Immunization recommendations discussed--UTD; continue yearly flu shots. Colonoscopy  recommendations reviewed--per Dr. Carlean Purl, planning for EGD and colonoscopy due to iron deficiency (not improving with iron infusions).   F/u 1 year, sooner prn based on today's labs. F/u as planned with her multiple other providers (GI, neuro, heme, GYN).

## 2020-12-30 NOTE — Telephone Encounter (Signed)
See prior message.  Same request came yesterday

## 2020-12-30 NOTE — Patient Instructions (Incomplete)
  HEALTH MAINTENANCE RECOMMENDATIONS:  It is recommended that you get at least 30 minutes of aerobic exercise at least 5 days/week (for weight loss, you may need as much as 60-90 minutes). This can be any activity that gets your heart rate up. This can be divided in 10-15 minute intervals if needed, but try and build up your endurance at least once a week.  Weight bearing exercise is also recommended twice weekly.  Eat a healthy diet with lots of vegetables, fruits and fiber.  "Colorful" foods have a lot of vitamins (ie green vegetables, tomatoes, red peppers, etc).  Limit sweet tea, regular sodas and alcoholic beverages, all of which has a lot of calories and sugar.  Up to 1 alcoholic drink daily may be beneficial for women (unless trying to lose weight, watch sugars).  Drink a lot of water.  Calcium recommendations are 1200-1500 mg daily (1500 mg for postmenopausal women or women without ovaries), and vitamin D 1000 IU daily.  This should be obtained from diet and/or supplements (vitamins), and calcium should not be taken all at once, but in divided doses.  Monthly self breast exams and yearly mammograms for women over the age of 14 is recommended.  Sunscreen of at least SPF 30 should be used on all sun-exposed parts of the skin when outside between the hours of 10 am and 4 pm (not just when at beach or pool, but even with exercise, golf, tennis, and yard work!)  Use a sunscreen that says "broad spectrum" so it covers both UVA and UVB rays, and make sure to reapply every 1-2 hours.  Remember to change the batteries in your smoke detectors when changing your clock times in the spring and fall. Carbon monoxide detectors are recommended for your home.  Use your seat belt every time you are in a car, and please drive safely and not be distracted with cell phones and texting while driving.  Consider getting a pneumovax booster (when you can, based on your medication timing). Try and get flu shot  earlier next season (in the Fall, Sept/Oct/Nov).  Consider a 5 year follow-up bone density test (sometime in the next year or so), to re-evaluate the osteopenia, ensure no significant progression. Continue your Calcium, D and try and get regular weight-bearing exercise at least 2x/week.

## 2020-12-31 ENCOUNTER — Encounter: Payer: Self-pay | Admitting: Family Medicine

## 2020-12-31 ENCOUNTER — Ambulatory Visit (INDEPENDENT_AMBULATORY_CARE_PROVIDER_SITE_OTHER): Payer: 59 | Admitting: Family Medicine

## 2020-12-31 ENCOUNTER — Other Ambulatory Visit: Payer: Self-pay

## 2020-12-31 VITALS — BP 120/76 | HR 84 | Ht 64.0 in | Wt 224.0 lb

## 2020-12-31 DIAGNOSIS — E538 Deficiency of other specified B group vitamins: Secondary | ICD-10-CM

## 2020-12-31 DIAGNOSIS — G35 Multiple sclerosis: Secondary | ICD-10-CM

## 2020-12-31 DIAGNOSIS — E781 Pure hyperglyceridemia: Secondary | ICD-10-CM

## 2020-12-31 DIAGNOSIS — E559 Vitamin D deficiency, unspecified: Secondary | ICD-10-CM | POA: Diagnosis not present

## 2020-12-31 DIAGNOSIS — Z86711 Personal history of pulmonary embolism: Secondary | ICD-10-CM

## 2020-12-31 DIAGNOSIS — R911 Solitary pulmonary nodule: Secondary | ICD-10-CM

## 2020-12-31 DIAGNOSIS — Z7901 Long term (current) use of anticoagulants: Secondary | ICD-10-CM

## 2020-12-31 DIAGNOSIS — M8580 Other specified disorders of bone density and structure, unspecified site: Secondary | ICD-10-CM

## 2020-12-31 DIAGNOSIS — Z6838 Body mass index (BMI) 38.0-38.9, adult: Secondary | ICD-10-CM

## 2020-12-31 DIAGNOSIS — Z0000 Encounter for general adult medical examination without abnormal findings: Secondary | ICD-10-CM | POA: Diagnosis not present

## 2020-12-31 DIAGNOSIS — Z1159 Encounter for screening for other viral diseases: Secondary | ICD-10-CM | POA: Diagnosis not present

## 2020-12-31 DIAGNOSIS — Z79899 Other long term (current) drug therapy: Secondary | ICD-10-CM

## 2020-12-31 DIAGNOSIS — K5000 Crohn's disease of small intestine without complications: Secondary | ICD-10-CM

## 2020-12-31 DIAGNOSIS — D500 Iron deficiency anemia secondary to blood loss (chronic): Secondary | ICD-10-CM

## 2020-12-31 DIAGNOSIS — Z7960 Long term (current) use of unspecified immunomodulators and immunosuppressants: Secondary | ICD-10-CM

## 2020-12-31 DIAGNOSIS — F325 Major depressive disorder, single episode, in full remission: Secondary | ICD-10-CM

## 2020-12-31 LAB — POCT URINALYSIS DIP (PROADVANTAGE DEVICE)
Bilirubin, UA: NEGATIVE
Blood, UA: NEGATIVE
Glucose, UA: NEGATIVE mg/dL
Ketones, POC UA: NEGATIVE mg/dL
Leukocytes, UA: NEGATIVE
Nitrite, UA: NEGATIVE
Protein Ur, POC: NEGATIVE mg/dL
Specific Gravity, Urine: 1.025
Urobilinogen, Ur: NEGATIVE
pH, UA: 6 (ref 5.0–8.0)

## 2020-12-31 MED ORDER — APIXABAN 5 MG PO TABS: 5.0000 mg | ORAL_TABLET | Freq: Two times a day (BID) | ORAL | 11 refills | Status: DC

## 2020-12-31 MED ORDER — ESCITALOPRAM OXALATE 10 MG PO TABS: ORAL_TABLET | ORAL | 3 refills | Status: DC

## 2020-12-31 MED ORDER — BUPROPION HCL ER (SR) 100 MG PO TB12: 100.0000 mg | ORAL_TABLET | Freq: Two times a day (BID) | ORAL | 3 refills | Status: DC

## 2020-12-31 MED ORDER — FENOFIBRATE 160 MG PO TABS
ORAL_TABLET | ORAL | 1 refills | Status: DC
Start: 2020-12-31 — End: 2021-06-22

## 2021-01-01 ENCOUNTER — Other Ambulatory Visit: Payer: Self-pay

## 2021-01-01 ENCOUNTER — Inpatient Hospital Stay: Payer: 59 | Attending: Hematology

## 2021-01-01 VITALS — BP 103/48 | HR 96 | Temp 98.7°F | Resp 18

## 2021-01-01 DIAGNOSIS — Z833 Family history of diabetes mellitus: Secondary | ICD-10-CM | POA: Insufficient documentation

## 2021-01-01 DIAGNOSIS — Z885 Allergy status to narcotic agent status: Secondary | ICD-10-CM | POA: Diagnosis not present

## 2021-01-01 DIAGNOSIS — Z6841 Body Mass Index (BMI) 40.0 and over, adult: Secondary | ICD-10-CM | POA: Insufficient documentation

## 2021-01-01 DIAGNOSIS — Z9049 Acquired absence of other specified parts of digestive tract: Secondary | ICD-10-CM | POA: Diagnosis not present

## 2021-01-01 DIAGNOSIS — Z86711 Personal history of pulmonary embolism: Secondary | ICD-10-CM | POA: Diagnosis present

## 2021-01-01 DIAGNOSIS — Z811 Family history of alcohol abuse and dependence: Secondary | ICD-10-CM | POA: Diagnosis not present

## 2021-01-01 DIAGNOSIS — K5090 Crohn's disease, unspecified, without complications: Secondary | ICD-10-CM | POA: Insufficient documentation

## 2021-01-01 DIAGNOSIS — Z800 Family history of malignant neoplasm of digestive organs: Secondary | ICD-10-CM | POA: Diagnosis not present

## 2021-01-01 DIAGNOSIS — D75839 Thrombocytosis, unspecified: Secondary | ICD-10-CM | POA: Insufficient documentation

## 2021-01-01 DIAGNOSIS — Z818 Family history of other mental and behavioral disorders: Secondary | ICD-10-CM | POA: Diagnosis not present

## 2021-01-01 DIAGNOSIS — Z7901 Long term (current) use of anticoagulants: Secondary | ICD-10-CM | POA: Insufficient documentation

## 2021-01-01 DIAGNOSIS — Z888 Allergy status to other drugs, medicaments and biological substances status: Secondary | ICD-10-CM | POA: Insufficient documentation

## 2021-01-01 DIAGNOSIS — Z793 Long term (current) use of hormonal contraceptives: Secondary | ICD-10-CM | POA: Diagnosis not present

## 2021-01-01 DIAGNOSIS — Z803 Family history of malignant neoplasm of breast: Secondary | ICD-10-CM | POA: Diagnosis not present

## 2021-01-01 DIAGNOSIS — Z87440 Personal history of urinary (tract) infections: Secondary | ICD-10-CM | POA: Diagnosis not present

## 2021-01-01 DIAGNOSIS — Z79899 Other long term (current) drug therapy: Secondary | ICD-10-CM | POA: Diagnosis not present

## 2021-01-01 DIAGNOSIS — Z86718 Personal history of other venous thrombosis and embolism: Secondary | ICD-10-CM | POA: Diagnosis present

## 2021-01-01 DIAGNOSIS — D509 Iron deficiency anemia, unspecified: Secondary | ICD-10-CM | POA: Diagnosis present

## 2021-01-01 DIAGNOSIS — E538 Deficiency of other specified B group vitamins: Secondary | ICD-10-CM | POA: Diagnosis not present

## 2021-01-01 DIAGNOSIS — Z83438 Family history of other disorder of lipoprotein metabolism and other lipidemia: Secondary | ICD-10-CM | POA: Diagnosis not present

## 2021-01-01 DIAGNOSIS — D500 Iron deficiency anemia secondary to blood loss (chronic): Secondary | ICD-10-CM

## 2021-01-01 DIAGNOSIS — Z87891 Personal history of nicotine dependence: Secondary | ICD-10-CM | POA: Diagnosis not present

## 2021-01-01 DIAGNOSIS — Z823 Family history of stroke: Secondary | ICD-10-CM | POA: Insufficient documentation

## 2021-01-01 DIAGNOSIS — Z801 Family history of malignant neoplasm of trachea, bronchus and lung: Secondary | ICD-10-CM | POA: Diagnosis not present

## 2021-01-01 LAB — LIPID PANEL
Chol/HDL Ratio: 2.7 ratio (ref 0.0–4.4)
Cholesterol, Total: 151 mg/dL (ref 100–199)
HDL: 56 mg/dL (ref >39)
LDL Chol Calc (NIH): 64 mg/dL (ref 0–99)
Triglycerides: 189 mg/dL — ABNORMAL HIGH (ref 0–149)
VLDL Cholesterol Cal: 31 mg/dL (ref 5–40)

## 2021-01-01 LAB — GLUCOSE, RANDOM: Glucose: 76 mg/dL (ref 65–99)

## 2021-01-01 LAB — HEPATITIS C ANTIBODY: Hep C Virus Ab: <0.1 s/co ratio (ref 0.0–0.9)

## 2021-01-01 MED ORDER — LORAZEPAM 2 MG/ML IJ SOLN
0.5000 mg | Freq: Once | INTRAMUSCULAR | Status: AC
  Administered 2021-01-01: 0.5 mg via INTRAVENOUS

## 2021-01-01 MED ORDER — SODIUM CHLORIDE 0.9 % IV SOLN
Freq: Once | INTRAVENOUS | Status: AC
  Filled 2021-01-01: qty 250

## 2021-01-01 MED ORDER — DIPHENHYDRAMINE HCL 25 MG PO CAPS
ORAL_CAPSULE | ORAL | Status: AC
  Filled 2021-01-01: qty 2

## 2021-01-01 MED ORDER — METHYLPREDNISOLONE SODIUM SUCC 125 MG IJ SOLR
INTRAMUSCULAR | Status: AC
  Filled 2021-01-01: qty 2

## 2021-01-01 MED ORDER — FAMOTIDINE IN NACL 20-0.9 MG/50ML-% IV SOLN
INTRAVENOUS | Status: AC
  Filled 2021-01-01: qty 50

## 2021-01-01 MED ORDER — METHYLPREDNISOLONE SODIUM SUCC 125 MG IJ SOLR
80.0000 mg | Freq: Once | INTRAMUSCULAR | Status: AC
  Administered 2021-01-01: 80 mg via INTRAVENOUS

## 2021-01-01 MED ORDER — ACETAMINOPHEN 325 MG PO TABS
650.0000 mg | ORAL_TABLET | Freq: Once | ORAL | Status: AC
  Administered 2021-01-01: 650 mg via ORAL

## 2021-01-01 MED ORDER — DIPHENHYDRAMINE HCL 25 MG PO TABS
50.0000 mg | ORAL_TABLET | Freq: Once | ORAL | Status: AC
  Administered 2021-01-01: 50 mg via ORAL
  Filled 2021-01-01: qty 2

## 2021-01-01 MED ORDER — FAMOTIDINE IN NACL 20-0.9 MG/50ML-% IV SOLN
20.0000 mg | Freq: Once | INTRAVENOUS | Status: AC
  Administered 2021-01-01: 20 mg via INTRAVENOUS

## 2021-01-01 MED ORDER — LORAZEPAM 2 MG/ML IJ SOLN
INTRAMUSCULAR | Status: AC
  Filled 2021-01-01: qty 1

## 2021-01-01 MED ORDER — SODIUM CHLORIDE 0.9 % IV SOLN
50.0000 mg | Freq: Once | INTRAVENOUS | Status: AC
  Administered 2021-01-01: 50 mg via INTRAVENOUS
  Filled 2021-01-01: qty 1

## 2021-01-01 MED ORDER — ACETAMINOPHEN 325 MG PO TABS
ORAL_TABLET | ORAL | Status: AC
  Filled 2021-01-01: qty 2

## 2021-01-01 MED ORDER — SODIUM CHLORIDE 0.9 % IV SOLN
1500.0000 mg | Freq: Once | INTRAVENOUS | Status: AC
  Administered 2021-01-01: 1500 mg via INTRAVENOUS
  Filled 2021-01-01: qty 30

## 2021-01-01 NOTE — Progress Notes (Signed)
Patient was observed for 30 minutes post iron infusion with no complaints. Vitals stable and patient in no distress upon discharge.

## 2021-01-01 NOTE — Telephone Encounter (Signed)
Just touching base on this clearance. Thank you for your time.

## 2021-01-03 ENCOUNTER — Other Ambulatory Visit: Payer: Self-pay | Admitting: Neurology

## 2021-01-05 ENCOUNTER — Other Ambulatory Visit: Payer: Self-pay

## 2021-01-13 NOTE — Telephone Encounter (Signed)
Please help [place] out with this anti-coag clearance Sir, thank you.

## 2021-01-13 NOTE — Telephone Encounter (Signed)
I did refill med for her (mainly out of convenience for her)--she was referred to Dr. Irene Limbo specifically for management of her DVT/PE, and she remains under his care regularly.  It would be most appropriate to get clearance from Dr. Irene Limbo regarding stopping the med for procedures

## 2021-01-14 ENCOUNTER — Ambulatory Visit: Payer: 59 | Admitting: Internal Medicine

## 2021-01-19 NOTE — Telephone Encounter (Signed)
I left a detailed message with a voicemail at Dr Grier Mitts office about this anti-coag clearance.

## 2021-01-21 ENCOUNTER — Ambulatory Visit
Admission: RE | Admit: 2021-01-21 | Discharge: 2021-01-21 | Disposition: A | Discharge status: Home / Self Care | Payer: 59 | Source: Ambulatory Visit | Attending: Family Medicine | Admitting: Family Medicine

## 2021-01-21 ENCOUNTER — Other Ambulatory Visit: Payer: Self-pay

## 2021-01-21 DIAGNOSIS — Z1231 Encounter for screening mammogram for malignant neoplasm of breast: Secondary | ICD-10-CM

## 2021-01-21 HISTORY — DX: Crohn's disease, unspecified, without complications: K50.90

## 2021-01-21 NOTE — Telephone Encounter (Signed)
Tried to reach Trish to see if she can reach out to Dr Grier Mitts office and her phone just rings and then goes to a busy signal.

## 2021-01-23 ENCOUNTER — Telehealth: Payer: Self-pay

## 2021-01-23 ENCOUNTER — Encounter: Payer: Self-pay | Admitting: Hematology

## 2021-01-23 NOTE — Telephone Encounter (Signed)
Notified pt that Dr Irene Limbo had been given her message and we would follow up with her regarding his directive for her holding her medication pre-op. Pt acknowledged.

## 2021-01-23 NOTE — Telephone Encounter (Signed)
I spoke with Lindsay Mccarthy and she is going to try to reach Dr Irene Limbo 's office for me.

## 2021-01-26 ENCOUNTER — Telehealth: Payer: Self-pay | Admitting: Internal Medicine

## 2021-01-26 ENCOUNTER — Telehealth: Payer: Self-pay | Admitting: *Deleted

## 2021-01-26 NOTE — Telephone Encounter (Signed)
Erskine Emery, RN from Dr Grier Mitts office sent an okay per Dr Irene Limbo to hold the Eliquis for 2 days prior to the [facility] and  Dr Carlean Purl will tell when to re-start post procedure. Patient informed and verbalized understanding.

## 2021-01-26 NOTE — Telephone Encounter (Signed)
Sandy sent me a message thru the system in regards to Trish's medicine so no need to call her back.

## 2021-01-26 NOTE — Telephone Encounter (Signed)
Contacted Dr. Celesta Aver office, informed Patti Martinique, CMA:  related to patient's upcoming colonoscopy and endoscopy on April 5th - Per Dr. Irene Limbo: she may stop Eliquis 48h prior to colonoscopy and restart per GI recommendations once risk of bleeding controlled based on whether biopsies are done.  Ms. Martinique verbalized understanding. Notified patient of same information via Mychart message. Call routed to Dr. Carlean Purl and Ms. Martinique for review

## 2021-01-26 NOTE — Telephone Encounter (Signed)
Lovey Newcomer RN from Dr Storm Frisk office is requesting a call back from the [facility] regarding a pt's medication   [phone]

## 2021-02-03 ENCOUNTER — Other Ambulatory Visit: Payer: Self-pay

## 2021-02-03 ENCOUNTER — Encounter: Payer: Self-pay | Admitting: Internal Medicine

## 2021-02-03 ENCOUNTER — Ambulatory Visit (AMBULATORY_SURGERY_CENTER): Payer: 59 | Admitting: Internal Medicine

## 2021-02-03 VITALS — BP 119/74 | HR 60 | Temp 96.8°F | Resp 13 | Ht 64.0 in | Wt 224.0 lb

## 2021-02-03 DIAGNOSIS — K50018 Crohn's disease of small intestine with other complication: Secondary | ICD-10-CM | POA: Diagnosis not present

## 2021-02-03 DIAGNOSIS — K2970 Gastritis, unspecified, without bleeding: Secondary | ICD-10-CM

## 2021-02-03 DIAGNOSIS — K9189 Other postprocedural complications and disorders of digestive system: Secondary | ICD-10-CM

## 2021-02-03 DIAGNOSIS — K644 Residual hemorrhoidal skin tags: Secondary | ICD-10-CM

## 2021-02-03 DIAGNOSIS — D509 Iron deficiency anemia, unspecified: Secondary | ICD-10-CM | POA: Diagnosis present

## 2021-02-03 DIAGNOSIS — K2990 Gastroduodenitis, unspecified, without bleeding: Secondary | ICD-10-CM

## 2021-02-03 DIAGNOSIS — Z8719 Personal history of other diseases of the digestive system: Secondary | ICD-10-CM

## 2021-02-03 DIAGNOSIS — K2950 Unspecified chronic gastritis without bleeding: Secondary | ICD-10-CM

## 2021-02-03 MED ORDER — SODIUM CHLORIDE 0.9 % IV SOLN: 500.0000 mL | Freq: Once | INTRAVENOUS | Status: DC

## 2021-02-03 NOTE — Op Note (Signed)
[place] Patient Name: Lindsay Mccarthy Procedure Date: 02/03/2021 7:33 AM MRN: [id number] Endoscopist: Gatha Mayer , MD Age: 45 Referring MD:  Date of Birth: Nov 15, 1975 Gender: Female Account #: [id number] Procedure:                Colonoscopy Indications:              Iron deficiency anemia, Crohn's disease of the                            small bowel, Follow-up of Crohn's disease of the                            small bowel, Disease activity assessment of Crohn's                            disease of the small bowel, Assess therapeutic                            response to therapy of Crohn's disease of the small                            bowel Medicines:                Propofol per Anesthesia, Monitored Anesthesia Care Procedure:                Pre-Anesthesia Assessment:                           - Prior to the procedure, a History and Physical                            was performed, and patient medications and                            allergies were reviewed. The patient's tolerance of                            previous anesthesia was also reviewed. The risks                            and benefits of the procedure and the sedation                            options and risks were discussed with the patient.                            All questions were answered, and informed consent                            was obtained. Prior Anticoagulants: The patient has                            taken no previous anticoagulant or antiplatelet  agents. ASA Grade Assessment: II - A patient with                            mild systemic disease. After reviewing the risks                            and benefits, the patient was deemed in                            satisfactory condition to undergo the procedure.                           After obtaining informed consent, the colonoscope                            was passed under direct vision.  Throughout the                            procedure, the patient's blood pressure, pulse, and                            oxygen saturations were monitored continuously. The                            Olympus PFC-[id number] (#[id number]) Colonoscope was                            introduced through the anus and advanced to the the                            ileocolonic anastomosis. The colonoscopy was                            performed without difficulty. The patient tolerated                            the procedure well. The quality of the bowel                            preparation was good. The rectum and Ileocolonic                            anastomsis areas were photographed. The bowel                            preparation used was Miralax via split dose                            instruction. Scope In: 7:50:24 AM Scope Out: 8:09:37 AM Scope Withdrawal Time: 0 hours 12 minutes 13 seconds  Total Procedure Duration: 0 hours 19 minutes 13 seconds  Findings:                 Skin tags were found on perianal exam.  There was evidence of a prior end-to-side                            ileo-colonic anastomosis in the transverse colon.                            This was non-patent and was characterized by                            erosion, erythema, friable mucosa, inflammation and                            visible sutures. The anastomosis could not be                            traversed. Biopsies were taken with a cold forceps                            for histology. Verification of patient                            identification for the specimen was done. Estimated                            blood loss was minimal.                           The exam was otherwise without abnormality on                            direct and retroflexion views. Complications:            No immediate complications. Estimated Blood Loss:     Estimated blood loss was  minimal. Impression:               - Perianal skin tags found on perianal exam.                           - Non-patent end-to-side ileo-colonic anastomosis,                            characterized by erosion, erythema, friable mucosa,                            inflammation and visible sutures. Crohn's and                            suture granulomas Biopsied. ? stricture                           - The examination was otherwise normal on direct                            and retroflexion views. Recommendation:           - Patient has a contact number available for  emergencies. The signs and symptoms of potential                            delayed complications were discussed with the                            patient. Return to normal activities tomorrow.                            Written discharge instructions were provided to the                            patient.                           - Resume previous diet.                           - Continue present medications.                           - Resume Eliquis (apixaban) at prior dose tomorrow.                            This certainly makes bleeding more likely.                           - Await pathology results. Consider MR enterography                            (review CT from last year)                           - Repeat colonoscopy is recommended. The                            colonoscopy date will be determined after pathology                            results from today's exam become available for                            review. Gatha Mayer, MD 02/03/2021 8:27:45 AM This report has been signed electronically.

## 2021-02-03 NOTE — Patient Instructions (Addendum)
There were some erosions, tiny ulcers in the stomach.  I took biopsies of that I took biopsies of the small intestine and biopsies of the upper stomach as well to try to understand why you might have this.  It could be Crohn's though I doubt it it is possible.  The end of the small intestine where it joins the colon showed some inflammation and I could not really enter it.  I wonder if it is not narrowed or scarred down.  Your previous CT did not necessarily show that but it did not rule it out.  The colon itself looks fine.  As far as losing blood I think it could come from the stomach and the intestine.  It does not seem that your Crohn's is controlled as well as we would like it.  Being on Eliquis makes you bleed easier obviously.  Once I see the biopsy results I will make treatment recommendations.  I suspect at a minimum I will have you start an acid blocking medicine for the stomach but I want to see the biopsy results first.  I will be in touch in the next week or so.  I appreciate the opportunity to care you. Gatha Mayer, MD, [facility]  Begin Eliquis tomorrow.  Read all of the handouts given to you by your recovery room nurse.  YOU HAD AN ENDOSCOPIC PROCEDURE TODAY AT [facility]:   Refer to the procedure report that was given to you for any specific questions about what was found during the examination.  If the procedure report does not answer your questions, please call your gastroenterologist to clarify.  If you requested that your care partner not be given the details of your procedure findings, then the procedure report has been included in a sealed envelope for you to review at your convenience later.  YOU SHOULD EXPECT: Some feelings of bloating in the abdomen. Passage of more gas than usual.  Walking can help get rid of the air that was put into your GI tract during the procedure and reduce the bloating. If you had a lower endoscopy (such as a colonoscopy or  flexible sigmoidoscopy) you may notice spotting of blood in your stool or on the toilet paper. If you underwent a bowel prep for your procedure, you may not have a normal bowel movement for a few days.  Please Note:  You might notice some irritation and congestion in your nose or some drainage.  This is from the oxygen used during your procedure.  There is no need for concern and it should clear up in a day or so.  SYMPTOMS TO REPORT IMMEDIATELY:   Following lower endoscopy (colonoscopy or flexible sigmoidoscopy):  Excessive amounts of blood in the stool  Significant tenderness or worsening of abdominal pains  Swelling of the abdomen that is new, acute  Fever of 100F or higher   Following upper endoscopy (EGD)  Vomiting of blood or coffee ground material  New chest pain or pain under the shoulder blades  Painful or persistently difficult swallowing  New shortness of breath  Fever of 100F or higher  Black, tarry-looking stools  For urgent or emergent issues, a gastroenterologist can be reached at any hour by calling [phone]. Do not use MyChart messaging for urgent concerns.    DIET:  We do recommend a small meal at first, but then you may proceed to your regular diet.  Drink plenty of fluids but you should avoid alcoholic beverages  for 24 hours.  ACTIVITY:  You should plan to take it easy for the rest of today and you should NOT DRIVE or use heavy machinery until tomorrow (because of the sedation medicines used during the test).    FOLLOW UP: Our staff will call the number listed on your records 48-72 hours following your procedure to check on you and address any questions or concerns that you may have regarding the information given to you following your procedure. If we do not reach you, we will leave a message.  We will attempt to reach you two times.  During this call, we will ask if you have developed any symptoms of COVID 19. If you develop any symptoms (ie: fever,  flu-like symptoms, shortness of breath, cough etc.) before then, please call [phone].  If you test positive for Covid 19 in the 2 weeks post procedure, please call and report this information to [place].    If any biopsies were taken you will be contacted by phone or by letter within the next 1-3 weeks.  Please call us at [phone] if you have not heard about the biopsies in 3 weeks.    SIGNATURES/CONFIDENTIALITY: You and/or your care partner have signed paperwork which will be entered into your electronic medical record.  These signatures attest to the fact that that the information above on your After Visit Summary has been reviewed and is understood.  Full responsibility of the confidentiality of this discharge information lies with you and/or your care-partner.

## 2021-02-03 NOTE — Progress Notes (Signed)
Vs in adm by Ilean Skill

## 2021-02-03 NOTE — Op Note (Signed)
[facility] [facility] Patient Name: Lindsay Mccarthy Procedure Date: 02/03/2021 7:33 AM MRN: [id number] Endoscopist: Gatha Mayer , MD Age: 45 Referring MD:  Date of Birth: 1976-05-25 Gender: Female Account #: [id number] Procedure:                Upper GI endoscopy Indications:              Iron deficiency anemia Medicines:                Propofol per Anesthesia, Monitored Anesthesia Care Procedure:                Pre-Anesthesia Assessment:                           - Prior to the procedure, a History and Physical                            was performed, and patient medications and                            allergies were reviewed. The patient's tolerance of                            previous anesthesia was also reviewed. The risks                            and benefits of the procedure and the sedation                            options and risks were discussed with the patient.                            All questions were answered, and informed consent                            was obtained. Prior Anticoagulants: The patient                            last took Eliquis (apixaban) 2 days prior to the                            procedure. ASA Grade Assessment: II - A patient                            with mild systemic disease. After reviewing the                            risks and benefits, the patient was deemed in                            satisfactory condition to undergo the procedure.                           After obtaining informed consent, the endoscope was  passed under direct vision. Throughout the                            procedure, the patient's blood pressure, pulse, and                            oxygen saturations were monitored continuously. The                            Endoscope was introduced through the mouth, and                            advanced to the second part of duodenum. The upper                            GI  endoscopy was accomplished without difficulty.                            The patient tolerated the procedure well. Scope In: Scope Out: Findings:                 Multiple dispersed small erosions with stigmata of                            recent bleeding were found in the gastric antrum.                            Biopsies were taken with a cold forceps for                            histology. Verification of patient identification                            for the specimen was done. Estimated blood loss was                            minimal.                           The exam was otherwise without abnormality.                           The cardia and gastric fundus were normal on                            retroflexion.                           Biopsies for histology were taken with a cold                            forceps in the entire duodenum for evaluation of                            celiac disease. Verification of patient  identification for the specimen was done. Estimated                            blood loss was minimal.                           Biopsies were taken with a cold forceps in the                            gastric body for histology. Verification of patient                            identification for the specimen was done. Estimated                            blood loss was minimal. Complications:            No immediate complications. Estimated Blood Loss:     Estimated blood loss was minimal. Impression:               - Erosive gastropathy with stigmata of recent                            bleeding. Biopsied.                           - The examination was otherwise normal.                           - Biopsies were taken with a cold forceps for                            evaluation of celiac disease.                           - Biopsies were taken with a cold forceps for                            histology. Recommendation:            - Patient has a contact number available for                            emergencies. The signs and symptoms of potential                            delayed complications were discussed with the                            patient. Return to normal activities tomorrow.                            Written discharge instructions were provided to the                            patient.                           -  Resume previous diet.                           - Continue present medications.                           - Await pathology results.                           - See the other procedure note for documentation of                            additional recommendations. Colonoscopy next Gatha Mayer, MD 02/03/2021 8:20:46 AM This report has been signed electronically.

## 2021-02-03 NOTE — Progress Notes (Signed)
Called to room to assist during endoscopic procedure.  Patient ID and intended procedure confirmed with present staff. Received instructions for my participation in the procedure from the performing physician.  

## 2021-02-03 NOTE — Progress Notes (Signed)
A/ox3, pleased with MAC, report to RN 

## 2021-02-05 ENCOUNTER — Telehealth: Payer: Self-pay

## 2021-02-05 ENCOUNTER — Telehealth: Payer: Self-pay | Admitting: *Deleted

## 2021-02-05 NOTE — Telephone Encounter (Signed)
  Follow up Call-  Call back number 02/03/2021  Post procedure Call Back phone  # [phone]  Permission to leave phone message Yes  Some recent data might be hidden     Patient questions:  Do you have a fever, pain , or abdominal swelling? No. Pain Score  0 *  Have you tolerated food without any problems? Yes.    Have you been able to return to your normal activities? Yes.    Do you have any questions about your discharge instructions: Diet   No. Medications  No. Follow up visit  No.  Do you have questions or concerns about your Care? No.  Actions: * If pain score is 4 or above: No action needed, pain <4.  1. Have you developed a fever since your procedure? no  2.   Have you had an respiratory symptoms (SOB or cough) since your procedure? no  3.   Have you tested positive for COVID 19 since your procedure no  4.   Have you had any family members/close contacts diagnosed with the COVID 19 since your procedure?  no   If yes to any of these questions please route to Joylene John, RN and Joella Prince, RN

## 2021-02-05 NOTE — Telephone Encounter (Signed)
First attempt follow up call to pt, no answer.

## 2021-02-11 NOTE — Progress Notes (Signed)
HEMATOLOGY/ONCOLOGY CONSULTATION NOTE  Date of Service: 02/12/2021  Patient Care Team: Rita Ohara, MD as PCP - General (Family Medicine) Pieter Partridge, DO as Consulting Physician (Neurology) Yisroel Ramming, Everardo All, MD as Consulting Physician (Obstetrics and Gynecology) Brunetta Genera, MD as Consulting Physician (Oncology)  CHIEF COMPLAINTS/PURPOSE OF CONSULTATION:  F/u for Acute DVT and PE  HISTORY OF PRESENTING ILLNESS:   Lindsay Mccarthy is a wonderful 45 y.o. female who has been referred to [place] by Dr. Tomi Bamberger, her PCP, for evaluation and management of DVT and PE.  Patient reported a significant h/o Crohns disease and noted that she noticed on 11/26/17 that her b/l thighs were heavy swelling and painful. She initially did not have SOB. She presented to the hospital on 11/27/2017 with a sudden onset of right leg swelling for 2 days. She is also been having some pleuritic chest pain and shortness of breath upon exertion. 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 The following day she returned and had AngioJet thrombectomy and balloon venoplasty. She had residual thrombus within her inferior vena cava. Following her procedure, the pain in her right leg had almost completely resolved. Unfortunately, she began developing shortness of breath and was confirmed to have worsening PE by CT angiogram. This required thrombolysis and IVC filter placement.   She had a superficial blood clot in her left calf in 07/2017. She was not on Cimzia at that time. Blood clot went away on it's own. Red "streaking" of the leg and her the pain went away in 6 weeks.  She has been on 352m of aspirin after her superficial blood clot. She was placed on eliquis since her acute DVT and PE.   She denies family history of blood clots. She had not been traveling  around that time and was being more active. She was on Birth control in the past and blood clots never occurred. She was never pregnant. Her previous stricture resection surgery did not lead to a blood clot.   She was diagnosed with Crohn's disease when she was on 150 She reviewed her medication for this. She had a stricture resected 12 years ago. She denies having IV iron or blood transfusions. She has not had a period in 8 years. She is currently on Mirena. Prior to did not have menorrhagia. Last colonoscopy was fall 2018.   On review of symptoms, pt notes she has rarely has GI bleeding. Breathing is back to baseline, only some shortness of breath upon significant exertion.    INTERVAL HISTORY   Lindsay SONNENreturns for a follow up of her acute DVT and PE. The patient's last visit with [place] on 12/18/2020. The pt reports that she is doing well overall.  The pt reports no new symptoms or concerns. The pt notes she tolerated the Iron Dextran well. The pt notes that she felt a heaviness in her chest that was alleviated by an anxiety medication mid-infusion. The pt notes this has improved her symptoms and energy. The pt notes that her heart palpitations and SOB have improved. The pt notes her sleeping has also improved. The pt notes she had a colonoscopy and endoscopy that showed evidence of some erosions in the stomach that had some bleeding issues. The pt had a biopsy on 04/05 but the results are still  not received yet. The pt notes that last week she did have an injection, which could be reason for elevated WBC.  Lab results today 02/12/2021 of CBC w/diff and CMP is as follows: all values are WNL except for WBC of 10.6K, RDW of 22.3, Plt of 479K, Neutro Abs of 8.5K, Total Bilirubin of 0.2. 02/12/2021 Iron pending. 02/12/2021 Ferritin pending.  On review of systems, pt reports anxiety and denies leg swelling, fatigue, SOB, chest palpitations, difficulty sleeping, and any other  symptoms.   MEDICAL HISTORY:  Past Medical History:  Diagnosis Date  . Anemia    related to Crohns flares  . Arm DVT (deep venous thromboembolism), acute, right ([place]) 11/29/2017  . Arthritis    knees, feet, hands, wrists, related to Crohns flare  . Asthma    Childhood  . B12 deficiency    monitored/treated by Dr. Carlean Purl  . Cervical dysplasia   . Clotting disorder ([place])    on eliquis hx dvt/ pulmonary embolism  . Crohn disease ([place])   . Crohn's disease of small intestine ([place]) 06/24/2012   Diagnosed 1999, in [place]. Ileitis only then. Treated with Imuran Remicade and prednisone.  Noncompliant with therapy. 2004 return to care and was treated with Remicade prednisone Pentasa Cipro and Flagyl. 2006 status post right hemicolectomy. Subsequently treated with azathioprine and Cimzia. 200 mg every other week.  azathioprine was added in 2010. Prometheus TP MT enzyme was negative.  . Depression   . DVT of upper extremity (deep vein thrombosis) ([place]) 11/28/2017  . Eczema    arms and behind knees, worse in winter  . Generalized anxiety disorder   . History of recurrent UTI (urinary tract infection)   . HPV (human papilloma virus) infection   . Hyperlipemia   . Hypertriglyceridemia 07/03/2012   10/2010 labs showed level of 753 with total cholesterol 180 and HDL 41' Labs 05/2013--TG 180   . Major depressive disorder in remission ([place]) 10/03/2013  . MS (multiple sclerosis) ([place])   . Multiple sclerosis ([place]) 2019   Officially diagnosed in the Fall  . Neuromuscular disorder ([place])   . Osteopenia 09/29/2011   T-1.6  . Papanicolaou smear 12/12   last abnormal 2011  . Pulmonary embolism ([place]) 11/30/2017  . Scaphoid fracture of wrist 2013   left  . Seasonal allergic rhinitis   . Shingles 09/2015   R hip  . Vitamin B12 deficiency 06/19/2012  . Vitamin D deficiency 12/16/2015   Vitamin D-OH level 12     SURGICAL HISTORY: Past Surgical History:  Procedure Laterality Date  . APPENDECTOMY     . CERVICAL BIOPSY  W/ LOOP ELECTRODE EXCISION  2009   ---paps normal since  . COLONOSCOPY  multiple   scanned  . ESOPHAGOGASTRODUODENOSCOPY  multiple   scanned  . HEMICOLECTOMY  2006  . IR ANGIOGRAM PULMONARY BILATERAL SELECTIVE  12/03/2017  . IR ANGIOGRAM SELECTIVE EACH ADDITIONAL VESSEL  12/03/2017  . IR ANGIOGRAM SELECTIVE EACH ADDITIONAL VESSEL  12/03/2017  . IR INFUSION THROMBOL ARTERIAL INITIAL (MS)  12/03/2017  . IR INFUSION THROMBOL ARTERIAL INITIAL (MS)  12/03/2017  . IR IVC FILTER PLMT / S&I /IMG GUID/MOD SED  12/04/2017  . IR THROMB F/U EVAL ART/VEN FINAL DAY (MS)  12/04/2017  . IR US GUIDE VASC ACCESS RIGHT  12/03/2017  . IVC FILTER REMOVAL N/A 07/11/2018   Procedure: IVC FILTER REMOVAL;  Surgeon: Serafina Mitchell, MD;  Location: [facility];  Service: Cardiovascular;  Laterality: N/A;  . IVC VENOGRAPHY N/A  07/11/2018   Procedure: IVC Venography;  Surgeon: Serafina Mitchell, MD;  Location: [facility];  Service: Cardiovascular;  Laterality: N/A;  . LEEP  2011   --done in [place]  . PERIPHERAL VASCULAR BALLOON ANGIOPLASTY Right 11/30/2017   Procedure: PERIPHERAL VASCULAR BALLOON ANGIOPLASTY;  Surgeon: Serafina Mitchell, MD;  Location: [facility];  Service: Cardiovascular;  Laterality: Right;  . PERIPHERAL VASCULAR THROMBECTOMY N/A 11/29/2017   Procedure: PERIPHERAL VASCULAR THROMBECTOMY - THROMBOLYSIS;  Surgeon: Serafina Mitchell, MD;  Location: [facility];  Service: Cardiovascular;  Laterality: N/A;  LYSIS CATHETER PLACEMENT ONLY  . PERIPHERAL VASCULAR THROMBECTOMY N/A 11/30/2017   Procedure: PERIPHERAL VASCULAR THROMBECTOMY - Lysis Recheck;  Surgeon: Serafina Mitchell, MD;  Location: [facility];  Service: Cardiovascular;  Laterality: N/A;  . WISDOM TOOTH EXTRACTION      SOCIAL HISTORY: Social History   Socioeconomic History  . Marital status: Married    Spouse name: Marcello Moores  . Number of children: 0  . Years of education: 47  . Highest education  level: Associate degree: occupational, Hotel manager, or vocational program  Occupational History  . 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 Smoking status: Former Smoker    Packs/day: 1.00    Years: 4.00    Pack years: 4.00    Quit date: 11/18/1998    Years since quitting: 22.2  . Smokeless tobacco: Never Used  Vaping Use  . Vaping Use: Never used  Substance and Sexual Activity  . Alcohol use: No    Alcohol/week: 0.0 standard drinks  . Drug use: Yes    Types: Marijuana    Comment: edibles, occasionally smokes, at bedtime  . Sexual activity: Yes    Partners: Male    Birth control/protection: I.U.D.    Comment: Mirena inserted 01/2016  Other Topics Concern  . Not on file  Social History Narrative   The patient is divorced.     Re-married Marcello Moores, partner of 10 years, on 10/10/16. 2 dogs, 1 cat   No children - doesn't want any   Curator for [employer].   Moved from [place] to [place] in 2013.   Past smoker   No alcohol   2-3 caffeinated beverages a day   She reports she is compliant with sunscreen given her increased risk of sun damage and skin cancer (no longer on azathioprine)      Updated 12/31/20   Social Determinants of Health   Financial Resource Strain: Not on file  Food Insecurity: Not on file  Transportation Needs: Not on file  Physical Activity: Not on file  Stress: Not on file  Social Connections: Not on file  Intimate Partner Violence: Not on file    FAMILY HISTORY: Family History  Problem Relation Age of Onset  . Hypertension Mother   . Breast cancer Mother 49  . Bone cancer Mother        metastatic from breast  . Depression Mother   . Hypothyroidism Mother   . Colon polyps Father   . Diabetes Father        borderline--resolved 2020  . Hypertension Father        off meds now  . Hyperlipidemia Father        off meds now  . Fuch's dystrophy Father   . Colon cancer Paternal Grandfather    . Multiple sclerosis Sister        affecting brain only  .  Alcohol abuse Sister   . Fuch's dystrophy Sister   . Stroke Maternal Grandmother   . Lung cancer Maternal Grandmother   . Hypertension Maternal Grandmother   . Heart disease Paternal Grandmother   . Hypertension Paternal Grandmother   . Hypertension Sister   . Heart disease Sister        poss MI in 71's    ALLERGIES:  is allergic to iron, venofer [iron sucrose], ibuprofen, morphine and related, prednisone, and adhesive [tape].  MEDICATIONS:  Current Outpatient Medications  Medication Sig Dispense Refill  . acetaminophen (TYLENOL) 500 MG tablet Take 1,000 mg by mouth every 6 (six) hours as needed for headache (pain). Reported on 12/15/2015 (Patient not taking: No sig reported)    . apixaban (ELIQUIS) 5 MG TABS tablet Take 1 tablet (5 mg total) by mouth 2 (two) times daily. 60 tablet 11  . buPROPion (WELLBUTRIN SR) 100 MG 12 hr tablet Take 1 tablet (100 mg total) by mouth 2 (two) times daily. 180 tablet 3  . cetirizine (ZYRTEC) 10 MG tablet Take 10 mg by mouth at bedtime.     . CHOLECALCIFEROL PO Take 5,000 Units by mouth 2 (two) times daily.    . colestipol (COLESTID) 5 g packet TAKE 5 GRAMS BY MOUTH DAILY (Patient not taking: No sig reported) 30 each 5  . cyanocobalamin (,VITAMIN B-12,) 1000 MCG/ML injection Inject 1 mL (1,000 mcg total) into the skin every 14 (fourteen) days. 10 mL 2  . dalfampridine 10 MG TB12 TAKE 1 TABLET BY MOUTH  EVERY 12 HOURS 60 tablet 5  . dicyclomine (BENTYL) 10 MG capsule Take 1 capsule (10 mg total) by mouth every 6 (six) hours as needed for spasms (abd pain/cramping). (Patient not taking: No sig reported) 90 capsule 4  . diphenhydrAMINE (BENADRYL) 25 MG tablet Take 25 mg by mouth at bedtime as needed for sleep.    Marland Kitchen escitalopram (LEXAPRO) 10 MG tablet TAKE 1 TABLET BY MOUTH AT  BEDTIME 90 tablet 3  . fenofibrate 160 MG tablet TAKE 1 TABLET BY MOUTH AT  BEDTIME 90 tablet 1  . gabapentin (NEURONTIN)  100 MG capsule TAKE 3 CAPSULES(300 MG) BY MOUTH AT BEDTIME 180 capsule 0  . Insulin Syringe 27G X 1/2" 1 ML MISC 1 each by Does not apply route every 14 (fourteen) days. For [place] B12 injection 24 each 1  . levonorgestrel (MIRENA) 20 MCG/24HR IUD 1 each by Intrauterine route once. Implanted April 2017    . NON FORMULARY Take 1 each by mouth at bedtime.    Marland Kitchen ocrelizumab 600 mg in sodium chloride 0.9 % 500 mL Inject 600 mg into the vein every 6 (six) months.    Marland Kitchen OVER THE COUNTER MEDICATION Apply 1 application topically daily as needed (arthritis pain). CBD Oil    . vedolizumab (ENTYVIO) 300 MG injection Inject 300 mg into the vein every 28 (twenty-eight) days. 300 each 6   No current facility-administered medications for this visit.    REVIEW OF SYSTEMS:   10 Point review of Systems was done is negative except as noted above.   PHYSICAL EXAMINATION: ECOG PERFORMANCE STATUS: 1 - Symptomatic but completely ambulatory  Exam was given in a chair.   GENERAL:alert, in no acute distress and comfortable SKIN: no acute rashes, no significant lesions EYES: conjunctiva are pink and non-injected, sclera anicteric OROPHARYNX: MMM, no exudates, no oropharyngeal erythema or ulceration NECK: supple, no JVD LYMPH:  no palpable lymphadenopathy in the cervical, axillary or inguinal regions LUNGS:  clear to auscultation b/l with normal respiratory effort HEART: regular rate & rhythm ABDOMEN:  normoactive bowel sounds , non tender, not distended. Extremity: no pedal edema PSYCH: alert & oriented x 3 with fluent speech NEURO: no focal motor/sensory deficits   LABORATORY DATA:  I have reviewed the data as listed  . CBC Latest Ref Rng & Units 02/12/2021 12/12/2020 08/07/2020  WBC 4.0 - 10.5 K/uL 10.6(H) 6.9 9.0  Hemoglobin 12.0 - 15.0 g/dL 12.7 9.1(L) 10.1(L)  Hematocrit 36.0 - 46.0 % 40.0 30.8(L) 33.2(L)  Platelets 150 - 400 K/uL 479(H) 446(H) 474(H)   CBC    Component Value Date/Time   WBC 10.6 (H)  02/12/2021 1402   RBC 4.71 02/12/2021 1402   HGB 12.7 02/12/2021 1402   HGB 9.1 (L) 12/12/2020 0752   HGB 13.1 12/18/2019 0820   HCT 40.0 02/12/2021 1402   HCT 40.7 12/18/2019 0820   PLT 479 (H) 02/12/2021 1402   PLT 446 (H) 12/12/2020 0752   PLT 475 (H) 12/18/2019 0820   MCV 84.9 02/12/2021 1402   MCV 90 12/18/2019 0820   MCH 27.0 02/12/2021 1402   MCHC 31.8 02/12/2021 1402   RDW 22.3 (H) 02/12/2021 1402   RDW 13.0 12/18/2019 0820   LYMPHSABS 1.0 02/12/2021 1402   LYMPHSABS 1.1 12/18/2019 0820   MONOABS 1.0 02/12/2021 1402   EOSABS 0.1 02/12/2021 1402   EOSABS 0.1 12/18/2019 0820   BASOSABS 0.0 02/12/2021 1402   BASOSABS 0.0 12/18/2019 0820    CMP Latest Ref Rng & Units 02/12/2021 12/31/2020 12/12/2020  Glucose 70 - 99 mg/dL 90 76 86  BUN 6 - 20 mg/dL 15 - 13  Creatinine 0.44 - 1.00 mg/dL 0.82 - 0.78  Sodium 135 - 145 mmol/L 140 - 137  Potassium 3.5 - 5.1 mmol/L 4.1 - 4.0  Chloride 98 - 111 mmol/L 106 - 108  CO2 22 - 32 mmol/L 26 - 22  Calcium 8.9 - 10.3 mg/dL 9.0 - 8.4(L)  Total Protein 6.5 - 8.1 g/dL 7.1 - 7.0  Total Bilirubin 0.3 - 1.2 mg/dL 0.2(L) - 0.2(L)  Alkaline Phos 38 - 126 U/L 54 - 57  AST 15 - 41 U/L 16 - 20  ALT 0 - 44 U/L 20 - 19   Component     Latest Ref Rng & Units 01/09/2018  PTT Lupus Anticoagulant     0.0 - 51.9 sec 34.3  DRVVT     0.0 - 47.0 sec 53.9 (H)  Lupus Anticoag Interp      Comment:  Beta-2 Glycoprotein I Ab, IgG     0 - 20 GPI IgG units 9  Beta-2-Glycoprotein I IgM     0 - 32 GPI IgM units <9  Beta-2-Glycoprotein I IgA     0 - 25 GPI IgA units <9  Anticardiolipin Ab,IgG,Qn     0 - 14 GPL U/mL <9  Anticardiolipin Ab,IgM,Qn     0 - 12 MPL U/mL 13 (H)  Anticardiolipin Ab,IgA,Qn     0 - 11 APL U/mL <9    Lupus anticoagulant panel      The value has a corrected status.      No reference range information available      Resulting Lab: [facility]      Comments:           (NOTE)           No lupus anticoagulant  was detected  RADIOGRAPHIC STUDIES: I have personally reviewed the  radiological images as listed and agreed with the findings in the report. MM 3D SCREEN BREAST BILATERAL  Result Date: 01/22/2021 CLINICAL DATA:  Screening. EXAM: DIGITAL SCREENING BILATERAL MAMMOGRAM WITH TOMOSYNTHESIS AND CAD TECHNIQUE: Bilateral screening digital craniocaudal and mediolateral oblique mammograms were obtained. Bilateral screening digital breast tomosynthesis was performed. The images were evaluated with computer-aided detection. COMPARISON:  Previous exam(s). ACR Breast Density Category b: There are scattered areas of fibroglandular density. FINDINGS: There are no findings suspicious for malignancy. The images were evaluated with computer-aided detection. IMPRESSION: No mammographic evidence of malignancy. A result letter of this screening mammogram will be mailed directly to the patient. RECOMMENDATION: Screening mammogram in one year. (Code:SM-B-01Y) BI-RADS CATEGORY  1: Negative. Electronically Signed   By: Ammie Ferrier M.D.   On: 01/22/2021 15:35    ASSESSMENT & PLAN:   Lindsay Mccarthy is a 45 y.o. caucasian female with   1. Acute DVT and Submassive PE No definitive provocation of DVT. PE likely from dislodged DVT after mechanical thrombectomy and balloon venoplasty Has IVC filter placed.  On Eliquis since 12/2017 Non smoker. No recent use of hormonal contracept. Prothrombin gene mutation/Factor V leiden mutation negative, Anti-phoshopholipid ab negative. Jak2 testing results negative. Lupus negative. Based on results, elevated platelets likely had a reactive cause due to inflammation from Crohn's and elevated iron.   Body mass index is 37.73 kg/m. - obesity could an additional contributing risk factor. 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  2. Elevated platelets  Have been elevated 300-800K for  over a year  -likely reactive to iron deficiency anemia and inflammation from Crohn's disease  3. Iron Deficiency Anemia . Lab Results  Component Value Date   IRON 20 (L) 12/12/2020   TIBC 635 (H) 12/12/2020   IRONPCTSAT 3 (L) 12/12/2020   (Iron and TIBC)  Lab Results  Component Value Date   FERRITIN <4 (L) 12/12/2020  -on ferrous sulfate BID. Tolerating well -continue PO ironto maintain ferritin levels >50. -if still low in 3 months would consider IV iron replacement.  4. Vitamin B12 deficiency  -seen at 1610 on 01/09/18 labs  -Currently on monthly B12 injections with PCP, will continue    5. Crohn's Disease -Mx by Dr. Carlean Purl -On Imuran and Entyvio   PLAN:  -Discussed pt labs today, 02/12/2021; blood chemistries normal, blood counts normal besides elevated neutrophils and WBC. Other labs pending. -Advised pt that we will continue to monitor iron labs for storage levels to observe for further needs of iron infusions. -Will monitor iron results today to see when we need to see the patient next. -Recommended pt receive the second COVID booster shot as recently approved. Advised pt to wait 4-6 months following first booster shot before getting this. The pt notes that she doesn't want to receive this at this time. -Continue 1000 mcg Vitamin B12 [place] every 2 weeks. -Continue Eliquis -Will see back in 3 months with labs.  FOLLOW UP: RTC with Dr Irene Limbo with labs in 3 months   The total time spent in the appointment was 20 minutes and more than 50% was on counseling and direct patient cares.  All of the patient's questions were answered with apparent satisfaction. The patient knows to call the clinic with any problems, questions or concerns.    Sullivan Lone MD [place] AAHIVMS [facility] Hematology/Oncology Physician [place]  (Office):  [phone] (Work cell):  [phone] (Fax):           [phone]  02/12/2021 3:02 PM  I, Reinaldo Raddle, am acting as scribe  for Dr. Sullivan Lone, MD.    .I have reviewed the above documentation for accuracy and completeness, and I agree with the above. Brunetta Genera MD

## 2021-02-12 ENCOUNTER — Other Ambulatory Visit: Payer: Self-pay

## 2021-02-12 ENCOUNTER — Inpatient Hospital Stay: Payer: 59 | Attending: Hematology

## 2021-02-12 ENCOUNTER — Inpatient Hospital Stay (HOSPITAL_BASED_OUTPATIENT_CLINIC_OR_DEPARTMENT_OTHER): Payer: 59 | Admitting: Hematology

## 2021-02-12 VITALS — BP 125/73 | HR 82 | Temp 96.7°F | Resp 18 | Ht 64.0 in | Wt 219.8 lb

## 2021-02-12 DIAGNOSIS — D500 Iron deficiency anemia secondary to blood loss (chronic): Secondary | ICD-10-CM

## 2021-02-12 DIAGNOSIS — D509 Iron deficiency anemia, unspecified: Secondary | ICD-10-CM | POA: Insufficient documentation

## 2021-02-12 DIAGNOSIS — Z823 Family history of stroke: Secondary | ICD-10-CM | POA: Diagnosis not present

## 2021-02-12 DIAGNOSIS — Z79899 Other long term (current) drug therapy: Secondary | ICD-10-CM | POA: Diagnosis not present

## 2021-02-12 DIAGNOSIS — Z7901 Long term (current) use of anticoagulants: Secondary | ICD-10-CM | POA: Insufficient documentation

## 2021-02-12 DIAGNOSIS — Z811 Family history of alcohol abuse and dependence: Secondary | ICD-10-CM | POA: Insufficient documentation

## 2021-02-12 DIAGNOSIS — Z6841 Body Mass Index (BMI) 40.0 and over, adult: Secondary | ICD-10-CM | POA: Insufficient documentation

## 2021-02-12 DIAGNOSIS — Z87440 Personal history of urinary (tract) infections: Secondary | ICD-10-CM | POA: Insufficient documentation

## 2021-02-12 DIAGNOSIS — Z800 Family history of malignant neoplasm of digestive organs: Secondary | ICD-10-CM | POA: Insufficient documentation

## 2021-02-12 DIAGNOSIS — Z818 Family history of other mental and behavioral disorders: Secondary | ICD-10-CM | POA: Diagnosis not present

## 2021-02-12 DIAGNOSIS — Z86711 Personal history of pulmonary embolism: Secondary | ICD-10-CM | POA: Diagnosis present

## 2021-02-12 DIAGNOSIS — Z833 Family history of diabetes mellitus: Secondary | ICD-10-CM | POA: Insufficient documentation

## 2021-02-12 DIAGNOSIS — Z87891 Personal history of nicotine dependence: Secondary | ICD-10-CM | POA: Insufficient documentation

## 2021-02-12 DIAGNOSIS — D75839 Thrombocytosis, unspecified: Secondary | ICD-10-CM | POA: Insufficient documentation

## 2021-02-12 DIAGNOSIS — Z86718 Personal history of other venous thrombosis and embolism: Secondary | ICD-10-CM | POA: Diagnosis not present

## 2021-02-12 DIAGNOSIS — K5090 Crohn's disease, unspecified, without complications: Secondary | ICD-10-CM | POA: Insufficient documentation

## 2021-02-12 DIAGNOSIS — Z803 Family history of malignant neoplasm of breast: Secondary | ICD-10-CM | POA: Diagnosis not present

## 2021-02-12 DIAGNOSIS — E538 Deficiency of other specified B group vitamins: Secondary | ICD-10-CM

## 2021-02-12 DIAGNOSIS — Z9049 Acquired absence of other specified parts of digestive tract: Secondary | ICD-10-CM | POA: Insufficient documentation

## 2021-02-12 DIAGNOSIS — Z801 Family history of malignant neoplasm of trachea, bronchus and lung: Secondary | ICD-10-CM | POA: Insufficient documentation

## 2021-02-12 DIAGNOSIS — Z885 Allergy status to narcotic agent status: Secondary | ICD-10-CM | POA: Insufficient documentation

## 2021-02-12 DIAGNOSIS — Z793 Long term (current) use of hormonal contraceptives: Secondary | ICD-10-CM | POA: Diagnosis not present

## 2021-02-12 DIAGNOSIS — Z83438 Family history of other disorder of lipoprotein metabolism and other lipidemia: Secondary | ICD-10-CM | POA: Insufficient documentation

## 2021-02-12 DIAGNOSIS — Z888 Allergy status to other drugs, medicaments and biological substances status: Secondary | ICD-10-CM | POA: Diagnosis not present

## 2021-02-12 LAB — CMP (CANCER CENTER ONLY)
ALT: 20 U/L (ref 0–44)
AST: 16 U/L (ref 15–41)
Albumin: 3.6 g/dL (ref 3.5–5.0)
Alkaline Phosphatase: 54 U/L (ref 38–126)
Anion gap: 8 (ref 5–15)
BUN: 15 mg/dL (ref 6–20)
CO2: 26 mmol/L (ref 22–32)
Calcium: 9 mg/dL (ref 8.9–10.3)
Chloride: 106 mmol/L (ref 98–111)
Creatinine: 0.82 mg/dL (ref 0.44–1.00)
GFR, Estimated: >60 mL/min (ref >60)
Glucose, Bld: 90 mg/dL (ref 70–99)
Potassium: 4.1 mmol/L (ref 3.5–5.1)
Sodium: 140 mmol/L (ref 135–145)
Total Bilirubin: 0.2 mg/dL — ABNORMAL LOW (ref 0.3–1.2)
Total Protein: 7.1 g/dL (ref 6.5–8.1)

## 2021-02-12 LAB — CBC WITH DIFFERENTIAL/PLATELET
Abs Immature Granulocytes: 0.04 10*3/uL (ref 0.00–0.07)
Basophils Absolute: 0 10*3/uL (ref 0.0–0.1)
Basophils Relative: 0 %
Eosinophils Absolute: 0.1 10*3/uL (ref 0.0–0.5)
Eosinophils Relative: 1 %
HCT: 40 % (ref 36.0–46.0)
Hemoglobin: 12.7 g/dL (ref 12.0–15.0)
Immature Granulocytes: 0 %
Lymphocytes Relative: 9 %
Lymphs Abs: 1 10*3/uL (ref 0.7–4.0)
MCH: 27 pg (ref 26.0–34.0)
MCHC: 31.8 g/dL (ref 30.0–36.0)
MCV: 84.9 fL (ref 80.0–100.0)
Monocytes Absolute: 1 10*3/uL (ref 0.1–1.0)
Monocytes Relative: 9 %
Neutro Abs: 8.5 10*3/uL — ABNORMAL HIGH (ref 1.7–7.7)
Neutrophils Relative %: 81 %
Platelets: 479 10*3/uL — ABNORMAL HIGH (ref 150–400)
RBC: 4.71 MIL/uL (ref 3.87–5.11)
RDW: 22.3 % — ABNORMAL HIGH (ref 11.5–15.5)
WBC: 10.6 10*3/uL — ABNORMAL HIGH (ref 4.0–10.5)
nRBC: 0 % (ref 0.0–0.2)

## 2021-02-12 LAB — IRON AND TIBC
Iron: 61 ug/dL (ref 41–142)
Saturation Ratios: 12 % — ABNORMAL LOW (ref 21–57)
TIBC: 494 ug/dL — ABNORMAL HIGH (ref 236–444)
UIBC: 433 ug/dL — ABNORMAL HIGH (ref 120–384)

## 2021-02-12 LAB — FERRITIN: Ferritin: 113 ng/mL (ref 11–307)

## 2021-02-16 ENCOUNTER — Telehealth: Payer: Self-pay | Admitting: Hematology

## 2021-02-16 NOTE — Telephone Encounter (Signed)
Scheduled follow-up appointment per 4/14 los. Patient is aware.

## 2021-02-19 ENCOUNTER — Other Ambulatory Visit: Payer: Self-pay | Admitting: Internal Medicine

## 2021-02-19 DIAGNOSIS — Z7960 Long term (current) use of unspecified immunomodulators and immunosuppressants: Secondary | ICD-10-CM

## 2021-02-19 DIAGNOSIS — Z79899 Other long term (current) drug therapy: Secondary | ICD-10-CM

## 2021-02-19 DIAGNOSIS — K50018 Crohn's disease of small intestine with other complication: Secondary | ICD-10-CM

## 2021-02-27 ENCOUNTER — Other Ambulatory Visit: Payer: 59

## 2021-02-27 DIAGNOSIS — Z79899 Other long term (current) drug therapy: Secondary | ICD-10-CM

## 2021-02-27 DIAGNOSIS — K50018 Crohn's disease of small intestine with other complication: Secondary | ICD-10-CM

## 2021-02-27 DIAGNOSIS — Z7960 Long term (current) use of unspecified immunomodulators and immunosuppressants: Secondary | ICD-10-CM

## 2021-03-07 LAB — VEDOLIZUMAB AND ANTI-VEDO AB
Anti-Vedolizumab Antibody: <25 ng/mL
Vedolizumab: 13 ug/mL

## 2021-03-13 ENCOUNTER — Other Ambulatory Visit: Payer: Self-pay | Admitting: Internal Medicine

## 2021-03-13 DIAGNOSIS — R197 Diarrhea, unspecified: Secondary | ICD-10-CM

## 2021-03-13 MED ORDER — DIPHENOXYLATE-ATROPINE 2.5-0.025 MG PO TABS: ORAL_TABLET | ORAL | 0 refills | Status: DC

## 2021-03-13 MED ORDER — BUDESONIDE 3 MG PO CPEP: 9.0000 mg | ORAL_CAPSULE | ORAL | 0 refills | Status: DC

## 2021-03-13 NOTE — Progress Notes (Signed)
Budesonide and generic Lomotil Rx for Crohn's flare Stool for C. difficile also

## 2021-03-22 ENCOUNTER — Other Ambulatory Visit: Payer: Self-pay | Admitting: Neurology

## 2021-04-13 DIAGNOSIS — U071 COVID-19: Secondary | ICD-10-CM

## 2021-04-13 HISTORY — DX: COVID-19: U07.1

## 2021-04-22 ENCOUNTER — Telehealth (INDEPENDENT_AMBULATORY_CARE_PROVIDER_SITE_OTHER): Payer: 59 | Admitting: Family Medicine

## 2021-04-22 ENCOUNTER — Encounter: Payer: Self-pay | Admitting: Family Medicine

## 2021-04-22 ENCOUNTER — Other Ambulatory Visit: Payer: Self-pay

## 2021-04-22 VITALS — HR 85 | Temp 100.6°F | Ht 64.0 in | Wt 218.0 lb

## 2021-04-22 DIAGNOSIS — G35 Multiple sclerosis: Secondary | ICD-10-CM

## 2021-04-22 DIAGNOSIS — U071 COVID-19: Secondary | ICD-10-CM

## 2021-04-22 DIAGNOSIS — R0602 Shortness of breath: Secondary | ICD-10-CM | POA: Diagnosis not present

## 2021-04-22 DIAGNOSIS — M62838 Other muscle spasm: Secondary | ICD-10-CM

## 2021-04-22 MED ORDER — MOLNUPIRAVIR EUA 200MG CAPSULE: 4.0000 | ORAL_CAPSULE | Freq: Two times a day (BID) | ORAL | 0 refills | Status: AC

## 2021-04-22 MED ORDER — ALBUTEROL SULFATE HFA 108 (90 BASE) MCG/ACT IN AERS
2.0000 | INHALATION_SPRAY | Freq: Four times a day (QID) | RESPIRATORY_TRACT | 0 refills | Status: DC | PRN
Start: 2021-04-22 — End: 2021-05-13

## 2021-04-22 MED ORDER — BACLOFEN 10 MG PO TABS: 10.0000 mg | ORAL_TABLET | Freq: Three times a day (TID) | ORAL | 0 refills | Status: DC | PRN

## 2021-04-22 NOTE — Patient Instructions (Signed)
Take the Molnupiravir twice daily as directed for 5 days.  Stay well hydrated.   Use tylenol for pain or fever. Use plain Mucinex if having thick mucus/phlegm, and to help with cough. You may use dextromethorphan as cough suppressant, if needed.  This is likely in the Nyquil that you've been taking, OR you can get Delsym syrup (12 hours), OR you can use Mucinex DM.  Do not overlap dextromethorphan from multiple medications.  Contact us for hydrocodone syrup if cough is worsening (since tessalon hasn't been effective for you in the past).  Go to ER if increasing chest pain, pain with breathing, shortness of breath, persistently high fevers,

## 2021-04-22 NOTE — Progress Notes (Signed)
Start time: 12:07 changed to phone at 12:14, due poor signal, causing loss of video and freezing of audio End time: 12:40  Virtual Visit via Video Note  I connected with Lindsay Mccarthy on 04/22/21 by a video enabled telemedicine application and verified that I am speaking with the correct person using two identifiers.  Location: Patient: home Provider: office   I discussed the limitations of evaluation and management by telemedicine and the availability of in person appointments. The patient expressed understanding and agreed to proceed.  History of Present Illness:  Chief Complaint  Patient presents with   Covid Positive    Last night positive covid-symptoms started yesterday morning. Has been travelling the last few days, she thought her ears and throat were just scratchy from the airplane.    She has been tired for a few days, but blamed it on the travel. She was flying to pick up her 39 yo nephew.  He has been coughing slightly, reportedly had a recent cold. Patient started with symptoms yesterday--had a "massive headache"--at her temples and behind her eyes. As the day went on yesterday she felt worse and worse.  Developed some cough (dry hack) and shortness of breath (similar to PE). She has pulse-ox, running 94-96%.  Feels okay if she is still, feels winded if more active, but pulse ox remains the same.  Smell/taste is "weird/different", but present. Pain in ribcage last night, better today.  Took Nyquil last night and in the middle of the night, and also about an hour ago. No congestion, runny nose.  Slight scratchy throat, not sore.  +COVID test last night (twice)  Has used albuterol with bronchitis in the past, and h/o childhood asthma. Doesn't have current inhaler.  Very stiff from laying in the bed.  Used to take baclofen for MS.  Asking for small prescription to help now.  PMH, PSH, SH reviewed  Outpatient Encounter Medications as of 04/22/2021  Medication Sig  Note   albuterol (VENTOLIN HFA) 108 (90 Base) MCG/ACT inhaler Inhale 2 puffs into the lungs every 6 (six) hours as needed for wheezing or shortness of breath.    apixaban (ELIQUIS) 5 MG TABS tablet Take 1 tablet (5 mg total) by mouth 2 (two) times daily.    baclofen (LIORESAL) 10 MG tablet Take 1 tablet (10 mg total) by mouth 3 (three) times daily as needed for muscle spasms.    buPROPion (WELLBUTRIN SR) 100 MG 12 hr tablet Take 1 tablet (100 mg total) by mouth 2 (two) times daily.    cetirizine (ZYRTEC) 10 MG tablet Take 10 mg by mouth at bedtime.     CHOLECALCIFEROL PO Take 5,000 Units by mouth 2 (two) times daily.    cyanocobalamin (,VITAMIN B-12,) 1000 MCG/ML injection Inject 1 mL (1,000 mcg total) into the skin every 14 (fourteen) days.    dalfampridine 10 MG TB12 TAKE 1 TABLET BY MOUTH  EVERY 12 HOURS    diphenoxylate-atropine (LOMOTIL) 2.5-0.025 MG tablet 1 to 2 tablets 4 times a day as needed for diarrhea    escitalopram (LEXAPRO) 10 MG tablet TAKE 1 TABLET BY MOUTH AT  BEDTIME    fenofibrate 160 MG tablet TAKE 1 TABLET BY MOUTH AT  BEDTIME    gabapentin (NEURONTIN) 100 MG capsule TAKE 3 CAPSULES(300 MG) BY MOUTH AT BEDTIME    Insulin Syringe 27G X 1/2" 1 ML MISC 1 each by Does not apply route every 14 (fourteen) days. For [place] B12 injection    levonorgestrel (MIRENA)  20 MCG/24HR IUD 1 each by Intrauterine route once. Implanted April 2017    molnupiravir EUA 200 mg CAPS Take 4 capsules (800 mg total) by mouth 2 (two) times daily for 5 days.    ocrelizumab 600 mg in sodium chloride 0.9 % 500 mL Inject 600 mg into the vein every 6 (six) months. 07/18/2019: Ocrevus Infusion q 6 months   Pseudoeph-Doxylamine-DM-APAP (NYQUIL PO) Take 30 mLs by mouth as needed. 04/22/2021: Last dose 1:30am   vedolizumab (ENTYVIO) 300 MG injection Inject 300 mg into the vein every 28 (twenty-eight) days.    acetaminophen (TYLENOL) 500 MG tablet Take 1,000 mg by mouth every 6 (six) hours as needed for headache (pain).  Reported on 12/15/2015 (Patient not taking: No sig reported)    budesonide (ENTOCORT EC) 3 MG 24 hr capsule Take 3 capsules (9 mg total) by mouth every morning. (Patient not taking: Reported on 04/22/2021)    colestipol (COLESTID) 5 g packet TAKE 5 GRAMS BY MOUTH DAILY (Patient not taking: No sig reported) 05/07/2020: Takes as needed   NON FORMULARY Take 1 each by mouth at bedtime. (Patient not taking: Reported on 04/22/2021) 07/03/2020: CBD and CANNABIS   OVER THE COUNTER MEDICATION Apply 1 application topically daily as needed (arthritis pain). CBD Oil (Patient not taking: Reported on 04/22/2021)    [DISCONTINUED] dicyclomine (BENTYL) 10 MG capsule Take 1 capsule (10 mg total) by mouth every 6 (six) hours as needed for spasms (abd pain/cramping). (Patient not taking: No sig reported)    [DISCONTINUED] diphenhydrAMINE (BENADRYL) 25 MG tablet Take 25 mg by mouth at bedtime as needed for sleep.    No facility-administered encounter medications on file as of 04/22/2021.   NOT taking molnupiravir or baclofen prior to today's visit (or albuterol)  Allergies  Allergen Reactions   Iron Other (See Comments)    Blood in stool/Rectal Bleeding   Venofer [Iron Sucrose] Other (See Comments)    Fever, infusion reaction    Ibuprofen Other (See Comments)    Avoids due to Crohns disease   Morphine And Related Itching    itchy   Prednisone Other (See Comments)    "crazy", hallucinations Tolerates methylprednisolone   Adhesive [Tape] Rash    Rash with electrodes   ROS: +HA, cough, shortness of breath and fatigue per HPI.  No runny nose, ear pain, sore throat.  No nausea, vomiting, diarrhea, bleeding, bruising.  See HPI    Observations/Objective:  Pulse 85   Temp (!) 100.6 F (38.1 C) (Oral)   Ht 5' 4"  (1.626 m)   Wt 218 lb (98.9 kg)   SpO2 94%   BMI 37.42 kg/m   Tired female laying in dark room in her bed.  She is speaking easily, no coughing or throat-clearing during the visit. She is alert and  oriented. Limited exam due to virtual nature of the visit.   Assessment and Plan:  COVID-19 virus infection - antiviral med as directed (not paxlovid due to eliquis); supportive measures.  s/sx for ER visit reviewed. isolation recommendations reviewed - Plan: molnupiravir EUA 200 mg CAPS, albuterol (VENTOLIN HFA) 108 (90 Base) MCG/ACT inhaler  Shortness of breath - albuterol prn. To go to ER if worsening SOB, pulse ox <90%, pain with breathing. - Plan: albuterol (VENTOLIN HFA) 108 (90 Base) MCG/ACT inhaler  Multiple sclerosis (HCC) - cont current meds - Plan: baclofen (LIORESAL) 10 MG tablet  Muscle spasm - refilled baclofen - Plan: baclofen (LIORESAL) 10 MG tablet  Discussed isolation recommendations, with today being  day 1 May leave isolation, masked, on Monday--if better  Rx Molnupiravir--no interactions  Tylenol for pain or fever Stay well hydrated Mucinex plain Dextromethorphan for cough suppressant DM version, or Delsym syrup, in Nyquil Don't overlap  Tessalon not very effective Call for hydrocodone syrup if cough is worsening  Follow Up Instructions:    I discussed the assessment and treatment plan with the patient. The patient was provided an opportunity to ask questions and all were answered. The patient agreed with the plan and demonstrated an understanding of the instructions.   The patient was advised to call back or seek an in-person evaluation if the symptoms worsen or if the condition fails to improve as anticipated.  I provided 36 minutes of video face-to-face time during this encounter.  Additional time was spent in chart review and documentation.   Vikki Ports, MD

## 2021-04-24 ENCOUNTER — Ambulatory Visit: Payer: 59 | Admitting: Neurology

## 2021-04-28 ENCOUNTER — Other Ambulatory Visit: Payer: Self-pay | Admitting: Neurology

## 2021-05-01 ENCOUNTER — Ambulatory Visit: Payer: 59 | Admitting: Neurology

## 2021-05-05 ENCOUNTER — Ambulatory Visit: Payer: 59 | Admitting: Obstetrics and Gynecology

## 2021-05-12 ENCOUNTER — Other Ambulatory Visit: Payer: Self-pay | Admitting: Family Medicine

## 2021-05-12 DIAGNOSIS — U071 COVID-19: Secondary | ICD-10-CM

## 2021-05-12 DIAGNOSIS — R0602 Shortness of breath: Secondary | ICD-10-CM

## 2021-05-13 ENCOUNTER — Emergency Department (INDEPENDENT_AMBULATORY_CARE_PROVIDER_SITE_OTHER): Payer: 59

## 2021-05-13 ENCOUNTER — Emergency Department (INDEPENDENT_AMBULATORY_CARE_PROVIDER_SITE_OTHER)
Admission: EM | Admit: 2021-05-13 | Discharge: 2021-05-13 | Disposition: A | Discharge status: Home / Self Care | Payer: 59 | Source: Home / Self Care | Attending: Family Medicine | Admitting: Family Medicine

## 2021-05-13 ENCOUNTER — Other Ambulatory Visit: Payer: Self-pay

## 2021-05-13 DIAGNOSIS — M25531 Pain in right wrist: Secondary | ICD-10-CM

## 2021-05-13 DIAGNOSIS — W19XXXA Unspecified fall, initial encounter: Secondary | ICD-10-CM

## 2021-05-13 DIAGNOSIS — R2681 Unsteadiness on feet: Secondary | ICD-10-CM

## 2021-05-13 DIAGNOSIS — M79641 Pain in right hand: Secondary | ICD-10-CM | POA: Diagnosis not present

## 2021-05-13 DIAGNOSIS — S63501A Unspecified sprain of right wrist, initial encounter: Secondary | ICD-10-CM

## 2021-05-13 MED ORDER — TRAMADOL HCL 50 MG PO TABS: 50.0000 mg | ORAL_TABLET | Freq: Four times a day (QID) | ORAL | 0 refills | Status: DC | PRN

## 2021-05-13 MED ORDER — ACETAMINOPHEN 325 MG PO TABS
650.0000 mg | ORAL_TABLET | Freq: Once | ORAL | Status: AC
  Administered 2021-05-13: 650 mg via ORAL

## 2021-05-13 NOTE — Discharge Instructions (Addendum)
Use ice and elevation to reduce pain and swelling Wear brace for comfort, probably 2 to 3 weeks Take tramadol as needed for pain.  If this is not effective call back If you continue to have significant wrist pain and pain with movement at 10 to 14 days, see your doctor or return here for repeat x-rays

## 2021-05-13 NOTE — ED Provider Notes (Signed)
[facility]    CSN: [id number] Arrival date & time: 05/13/21  1336      History   Chief Complaint Chief Complaint  Patient presents with   Wrist Pain    Right    HPI SATARA VIRELLA is a 45 y.o. female.   HPI Patient has MS.  She has gait instability and imbalance.  She usually uses a cane but today was going for a ride in the car and did not think she would get out of the car.  She did get out for short period of time, and unfortunately tripped over a curb and fell.  Landed on right hand.  The right wrist and hand is painful and swollen following the injury.  She is here concerned for fracture.  Her left wrist was similarly injured and had an occult scaphoid fracture that was not identified for 2 weeks.  Past Medical History:  Diagnosis Date   Anemia    related to Crohns flares   Arm DVT (deep venous thromboembolism), acute, right ([place]) 11/29/2017   Arthritis    knees, feet, hands, wrists, related to Crohns flare   Asthma    Childhood   B12 deficiency    monitored/treated by Dr. Carlean Purl   Cervical dysplasia    Clotting disorder ([place])    on eliquis hx dvt/ pulmonary embolism   COVID 04/13/2021   Crohn disease ([place])    Crohn's disease of small intestine ([place]) 06/24/2012   Diagnosed 1999, in [place]. Ileitis only then. Treated with Imuran Remicade and prednisone.  Noncompliant with therapy. 2004 return to care and was treated with Remicade prednisone Pentasa Cipro and Flagyl. 2006 status post right hemicolectomy. Subsequently treated with azathioprine and Cimzia. 200 mg every other week.  azathioprine was added in 2010. Prometheus TP MT enzyme was negative.   Depression    DVT of upper extremity (deep vein thrombosis) ([place]) 11/28/2017   Eczema    arms and behind knees, worse in winter   Generalized anxiety disorder    History of recurrent UTI (urinary tract infection)    HPV (human papilloma virus) infection    Hyperlipemia    Hypertriglyceridemia  07/03/2012   10/2010 labs showed level of 753 with total cholesterol 180 and HDL 41' Labs 05/2013--TG 180    Major depressive disorder in remission ([place]) 10/03/2013   MS (multiple sclerosis) ([place])    Multiple sclerosis ([place]) 2019   Officially diagnosed in the Fall   Neuromuscular disorder ([place])    Osteopenia 09/29/2011   T-1.6   Papanicolaou smear 10/2011   last abnormal 2011   Pulmonary embolism ([place]) 11/30/2017   Scaphoid fracture of wrist 2013   left   Seasonal allergic rhinitis    Shingles 09/2015   R hip   Vitamin B12 deficiency 06/19/2012   Vitamin D deficiency 12/16/2015   Vitamin D-OH level 12     Patient Active Problem List   Diagnosis Date Noted   Iron deficiency anemia 08/22/2020   Multiple falls 08/21/2019   Hematoma of right flank 08/21/2019   Anticoagulant long-term use 07/17/2019   Multiple sclerosis ([place]) 12/19/2018   Pulmonary nodule 07/19/2018   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) 11/27/2017   Iron deficiency anemia due to chronic blood loss 10/05/2017   Heartburn 10/05/2017   Other fatigue 10/05/2017   Vitamin D deficiency 12/16/2015  Major depressive disorder in remission ([place]) 10/03/2013   Hypertriglyceridemia 07/03/2012   Crohn's disease of small intestine ([place]) 06/24/2012   Long-term use of immunosuppressant medication Entyvio + ocrelizumab 06/24/2012   Vitamin B12 deficiency 06/19/2012   Osteopenia of femur neck T. score -1.4 09/29/2011    Past Surgical History:  Procedure Laterality Date   APPENDECTOMY     CERVICAL BIOPSY  W/ LOOP ELECTRODE EXCISION  2009   ---paps normal since   COLONOSCOPY  multiple   scanned   ESOPHAGOGASTRODUODENOSCOPY  multiple   scanned   HEMICOLECTOMY  2006   IR ANGIOGRAM PULMONARY BILATERAL SELECTIVE  12/03/2017   IR ANGIOGRAM SELECTIVE EACH ADDITIONAL VESSEL   12/03/2017   IR ANGIOGRAM SELECTIVE EACH ADDITIONAL VESSEL  12/03/2017   IR INFUSION THROMBOL ARTERIAL INITIAL (MS)  12/03/2017   IR INFUSION THROMBOL ARTERIAL INITIAL (MS)  12/03/2017   IR IVC FILTER PLMT / S&I /IMG GUID/MOD SED  12/04/2017   IR THROMB F/U EVAL ART/VEN FINAL DAY (MS)  12/04/2017   IR US GUIDE VASC ACCESS RIGHT  12/03/2017   IVC FILTER REMOVAL N/A 07/11/2018   Procedure: IVC FILTER REMOVAL;  Surgeon: Serafina Mitchell, MD;  Location: [facility];  Service: Cardiovascular;  Laterality: N/A;   IVC VENOGRAPHY N/A 07/11/2018   Procedure: IVC Venography;  Surgeon: Serafina Mitchell, MD;  Location: [facility];  Service: Cardiovascular;  Laterality: N/A;   LEEP  2011   --done in [place] Right 11/30/2017   Procedure: PERIPHERAL VASCULAR BALLOON ANGIOPLASTY;  Surgeon: Serafina Mitchell, MD;  Location: [facility];  Service: Cardiovascular;  Laterality: Right;   PERIPHERAL VASCULAR THROMBECTOMY N/A 11/29/2017   Procedure: PERIPHERAL VASCULAR THROMBECTOMY - THROMBOLYSIS;  Surgeon: Serafina Mitchell, MD;  Location: [facility];  Service: Cardiovascular;  Laterality: N/A;  LYSIS CATHETER PLACEMENT ONLY   PERIPHERAL VASCULAR THROMBECTOMY N/A 11/30/2017   Procedure: PERIPHERAL VASCULAR THROMBECTOMY - Lysis Recheck;  Surgeon: Serafina Mitchell, MD;  Location: [facility];  Service: Cardiovascular;  Laterality: N/A;   WISDOM TOOTH EXTRACTION      OB History     Gravida  0   Para  0   Term  0   Preterm  0   AB  0   Living  0      SAB  0   IAB  0   Ectopic  0   Multiple  0   Live Births               Home Medications    Prior to Admission medications   Medication Sig Start Date End Date Taking? Authorizing Provider  traMADol (ULTRAM) 50 MG tablet Take 1-2 tablets (50-100 mg total) by mouth every 6 (six) hours as needed. 05/13/21  Yes Raylene Everts, MD  acetaminophen (TYLENOL) 500 MG tablet Take 1,000  mg by mouth every 6 (six) hours as needed for headache (pain). Reported on 12/15/2015 Patient not taking: No sig reported    [provider]  apixaban (ELIQUIS) 5 MG TABS tablet Take 1 tablet (5 mg total) by mouth 2 (two) times daily. 12/31/20   Rita Ohara, MD  baclofen (LIORESAL) 10 MG tablet Take 1 tablet (10 mg total) by mouth 3 (three) times daily as needed for muscle spasms. 04/22/21   Rita Ohara, MD  buPROPion (WELLBUTRIN SR) 100 MG 12 hr tablet Take 1 tablet (100 mg total) by mouth 2 (two)  times daily. 12/31/20   Rita Ohara, MD  cetirizine (ZYRTEC) 10 MG tablet Take 10 mg by mouth at bedtime.     [provider]  CHOLECALCIFEROL PO Take 5,000 Units by mouth 2 (two) times daily.    [provider]  colestipol (COLESTID) 5 g packet TAKE 5 GRAMS BY MOUTH DAILY Patient not taking: No sig reported 11/27/18   Gatha Mayer, MD  cyanocobalamin (,VITAMIN B-12,) 1000 MCG/ML injection Inject 1 mL (1,000 mcg total) into the skin every 14 (fourteen) days. 07/08/20   Brunetta Genera, MD  dalfampridine 10 MG TB12 TAKE 1 TABLET BY MOUTH  EVERY 12 HOURS 04/28/21   Pieter Partridge, DO  diphenoxylate-atropine (LOMOTIL) 2.5-0.025 MG tablet 1 to 2 tablets 4 times a day as needed for diarrhea 03/13/21   Gatha Mayer, MD  escitalopram (LEXAPRO) 10 MG tablet TAKE 1 TABLET BY MOUTH AT  BEDTIME 12/31/20   Rita Ohara, MD  fenofibrate 160 MG tablet TAKE 1 TABLET BY MOUTH AT  BEDTIME 12/31/20   Rita Ohara, MD  gabapentin (NEURONTIN) 100 MG capsule TAKE 3 CAPSULES(300 MG) BY MOUTH AT BEDTIME Patient taking differently: Take 100 mg by mouth at bedtime. 03/23/21   Pieter Partridge, DO  Insulin Syringe 27G X 1/2" 1 ML MISC 1 each by Does not apply route every 14 (fourteen) days. For  B12 injection 07/08/20   Brunetta Genera, MD  levonorgestrel [facility]) 20 MCG/24HR IUD 1 each by Intrauterine route once. Implanted April 2017 05/19/11   [provider]  NON FORMULARY Take 1 each by mouth at  bedtime. CBD/ cannabis    [provider]  ocrelizumab 600 mg in sodium chloride 0.9 % 500 mL Inject 600 mg into the vein every 6 (six) months.    [provider]  OVER THE COUNTER MEDICATION Apply 1 application topically daily as needed (arthritis pain). CBD Oil    [provider]  vedolizumab (ENTYVIO) 300 MG injection Inject 300 mg into the vein every 28 (twenty-eight) days. 09/08/20   Gatha Mayer, MD    Family History Family History  Problem Relation Age of Onset   Hypertension Mother    Breast cancer Mother 42   Bone cancer Mother        metastatic from breast   Depression Mother    Hypothyroidism Mother    Colon polyps Father    Diabetes Father        borderline--resolved 2020   Hypertension Father        off meds now   Hyperlipidemia Father        off meds now   Fuch's dystrophy Father    Colon cancer Paternal Grandfather    Multiple sclerosis Sister        affecting brain only   Alcohol abuse Sister    Fuch's dystrophy Sister    Stroke Maternal Grandmother    Lung cancer Maternal Grandmother    Hypertension Maternal Grandmother    Heart disease Paternal Grandmother    Hypertension Paternal Grandmother    Hypertension Sister    Heart disease Sister        poss MI in 28's    Social History Social History   Tobacco Use   Smoking status: Former    Packs/day: 1.00    Years: 4.00    Pack years: 4.00    Types: Cigarettes    Quit date: 11/18/1998    Years since quitting: 22.4   Smokeless tobacco:  Never  Vaping Use   Vaping Use: Never used  Substance Use Topics   Alcohol use: No    Alcohol/week: 0.0 standard drinks   Drug use: Yes    Frequency: 4.0 times per week    Types: Marijuana    Comment: edibles, occasionally smokes, at bedtime     Allergies   Iron, Venofer [iron sucrose], Ibuprofen, Morphine and related, Prednisone, and Adhesive [tape]   Review of Systems Review of Systems See HPI  Physical Exam Triage Vital  Signs ED Triage Vitals  Enc Vitals Group     BP 05/13/21 1359 124/82     Pulse Rate 05/13/21 1359 81     Resp 05/13/21 1359 18     Temp 05/13/21 1359 98.8 F (37.1 C)     Temp src --      SpO2 05/13/21 1359 96 %     Weight 05/13/21 1349 218 lb (98.9 kg)     Height 05/13/21 1349 5' 4"  (1.626 m)     Head Circumference --      Peak Flow --      Pain Score 05/13/21 1349 7     Pain Loc --      Pain Edu? --      Excl. in [place]? --    No data found.  Updated Vital Signs BP 124/82 (BP Location: Right Arm)   Pulse 81   Temp 98.8 F (37.1 C)   Resp 18   Ht 5' 4"  (1.626 m)   Wt 98.9 kg   SpO2 96%   BMI 37.42 kg/m      Physical Exam Constitutional:      General: She is not in acute distress.    Appearance: She is well-developed.     Comments: Uncomfortable from hand pain  HENT:     Head: Normocephalic and atraumatic.     Mouth/Throat:     Comments: Mask is in place Eyes:     Conjunctiva/sclera: Conjunctivae normal.     Pupils: Pupils are equal, round, and reactive to light.  Cardiovascular:     Rate and Rhythm: Normal rate.  Pulmonary:     Effort: Pulmonary effort is normal. No respiratory distress.  Abdominal:     General: There is no distension.     Palpations: Abdomen is soft.  Musculoskeletal:        General: Normal range of motion.     Cervical back: Normal range of motion.     Comments: Extremity shows no bruising.  Mild soft tissue swelling.  Tenderness For the long and ring finger distal metacarpals.  There is also tenderness over the distal radius that is moderate.  Skin:    General: Skin is warm and dry.  Neurological:     Mental Status: She is alert.  Psychiatric:        Mood and Affect: Mood normal.        Behavior: Behavior normal.     UC Treatments / Results  Labs (all labs ordered are listed, but only abnormal results are displayed) Labs Reviewed - No data to display  EKG   Radiology DG Wrist Complete Right  Result Date:  05/13/2021 CLINICAL DATA:  [place] today with trauma to the hand and wrist. Generalized pain. EXAM: RIGHT WRIST - COMPLETE 3+ VIEW COMPARISON:  None. FINDINGS: There is no evidence of fracture or dislocation. There is no evidence of arthropathy or other focal bone abnormality. Soft tissues are unremarkable. IMPRESSION: Normal radiographs. Electronically Signed  By: Julious Langlois Chimes M.D.   On: 05/13/2021 14:52   DG Hand Complete Right  Result Date: 05/13/2021 CLINICAL DATA:  [place] today with trauma to the hand. Generalized pain. EXAM: RIGHT HAND - COMPLETE 3+ VIEW COMPARISON:  None. FINDINGS: There is no evidence of fracture or dislocation. There is no evidence of arthropathy or other focal bone abnormality. Soft tissues are unremarkable. IMPRESSION: Normal radiographs. Electronically Signed   By: Nashalie Sallis Chimes M.D.   On: 05/13/2021 14:51    Procedures Procedures (including critical care time)  Medications Ordered in UC Medications  acetaminophen (TYLENOL) tablet 650 mg (650 mg Oral Given 05/13/21 1426)    Initial Impression / Assessment and Plan / UC Course  I have reviewed the triage vital signs and the nursing notes.  Pertinent labs & imaging results that were available during my care of the patient were reviewed by me and considered in my medical decision making (see chart for details).    Patient is put into a splint.  Told to ice and immobilize.  She does ask how to make sure she does not have a scaphoid fracture like she had prior.  I told her that there is sometimes very difficult to see and the key is for her to return if not improving in 10 days for a repeat x-ray.  She can come here or to her usual orthopedist.  She cannot take nonsteroidal anti-inflammatory occasions due to anticoagulant use.  Final Clinical Impressions(s) / UC Diagnoses   Final diagnoses:  Sprain of right wrist, initial encounter  Fall, initial encounter  Gait instability     Discharge Instructions      Use ice  and elevation to reduce pain and swelling Wear brace for comfort, probably 2 to 3 weeks Take tramadol as needed for pain.  If this is not effective call back If you continue to have significant wrist pain and pain with movement at 10 to 14 days, see your doctor or return here for repeat x-rays   ED Prescriptions     Medication Sig Dispense Auth. Provider   traMADol (ULTRAM) 50 MG tablet Take 1-2 tablets (50-100 mg total) by mouth every 6 (six) hours as needed. 15 tablet Raylene Everts, MD      I have reviewed the PDMP during this encounter.   Raylene Everts, MD 05/13/21 1949

## 2021-05-13 NOTE — ED Triage Notes (Signed)
Pt presents to Urgent Care with c/o R wrist and hand pain following injury today. Pt states she fell and caught herself w/ her R hand.

## 2021-05-25 ENCOUNTER — Telehealth: Payer: Self-pay | Admitting: Internal Medicine

## 2021-05-25 NOTE — Telephone Encounter (Signed)
Inbound call from Sharrie Rothman from [facility] is requesting a call back stating that they need  nursing plan of treatment. But they do have the medical orders. Sharrie Rothman can reached at [phone]. Please advise. Thanks

## 2021-05-25 NOTE — Progress Notes (Signed)
HEMATOLOGY/ONCOLOGY CONSULTATION NOTE  Date of Service: 05/25/2021  Patient Care Team: Rita Ohara, MD as PCP - General (Family Medicine) Pieter Partridge, DO as Consulting Physician (Neurology) Yisroel Ramming, Everardo All, MD as Consulting Physician (Obstetrics and Gynecology) Brunetta Genera, MD as Consulting Physician (Oncology)  CHIEF COMPLAINTS/PURPOSE OF CONSULTATION:  F/u for Acute DVT and PE  HISTORY OF PRESENTING ILLNESS:   Lindsay Mccarthy is a wonderful 45 y.o. female who has been referred to [place] by Dr. Tomi Bamberger, her PCP, for evaluation and management of DVT and PE.  Patient reported a significant h/o Crohns disease and noted that she noticed on 11/26/17 that her b/l thighs were heavy swelling and painful. She initially did not have SOB. She presented to the hospital on 11/27/2017 with a sudden onset of right leg swelling for 2 days. She is also been having some pleuritic chest pain and shortness of breath upon exertion. 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 The following day she returned and had AngioJet thrombectomy and balloon venoplasty. She had residual thrombus within her inferior vena cava. Following her procedure, the pain in her right leg had almost completely resolved. Unfortunately, she began developing shortness of breath and was confirmed to have worsening PE by CT angiogram. This required thrombolysis and IVC filter placement.   She had a superficial blood clot in her left calf in 07/2017. She was not on Cimzia at that time. Blood clot went away on it's own. Red "streaking" of the leg and her the pain went away in 6 weeks.  She has been on 333m of aspirin after her superficial blood clot. She was placed on eliquis since her acute DVT and PE.   She denies family history of blood clots. She had not been traveling  around that time and was being more active. She was on Birth control in the past and blood clots never occurred. She was never pregnant. Her previous stricture resection surgery did not lead to a blood clot.   She was diagnosed with Crohn's disease when she was on 18 She reviewed her medication for this. She had a stricture resected 12 years ago. She denies having IV iron or blood transfusions. She has not had a period in 8 years. She is currently on Mirena. Prior to did not have menorrhagia. Last colonoscopy was fall 2018.   On review of symptoms, pt notes she has rarely has GI bleeding. Breathing is back to baseline, only some shortness of breath upon significant exertion.    INTERVAL HISTORY   Lindsay COOKSTONreturns for a follow up of her acute DVT and PE. The patient's last visit with [place] on 02/12/2021. The pt reports that she is doing well overall.  The pt reports her energy levels have been steady.  No overt evidence of GI bleeding.  Lab results today 05/26/2021 of CBC w/diff-no new anemia hemoglobin 12.8 thrombocytosis improved with platelets down to 418k.  Improving internal with iron replacement.  CMP unremarkable B12 level 243 Ferritin at 86 with an iron saturation of 9%.  On review of systems, pt reports no acute new symptoms.    MEDICAL HISTORY:  Past Medical History:  Diagnosis Date   Anemia    related to Crohns flares   Arm DVT (deep venous thromboembolism), [place]  Arthritis    knees, feet, hands, wrists, related to Crohns flare   Asthma    Childhood   B12 deficiency    monitored/treated by Dr. Carlean Purl   Cervical dysplasia    Clotting disorder ([place])    on eliquis hx dvt/ pulmonary embolism   COVID 04/13/2021   Crohn disease ([place])    Crohn's disease of small intestine ([place]) 06/24/2012   Diagnosed 1999, in [place]. Ileitis only then. Treated with Imuran Remicade and prednisone.  Noncompliant with therapy. 2004 return to care and  was treated with Remicade prednisone Pentasa Cipro and Flagyl. 2006 status post right hemicolectomy. Subsequently treated with azathioprine and Cimzia. 200 mg every other week.  azathioprine was added in 2010. Prometheus TP MT enzyme was negative.   Depression    DVT of upper extremity (deep vein thrombosis) ([place]) 11/28/2017   Eczema    arms and behind knees, worse in winter   Generalized anxiety disorder    History of recurrent UTI (urinary tract infection)    HPV (human papilloma virus) infection    Hyperlipemia    Hypertriglyceridemia 07/03/2012   10/2010 labs showed level of 753 with total cholesterol 180 and HDL 41' Labs 05/2013--TG 180    Major depressive disorder in remission ([place]) 10/03/2013   MS (multiple sclerosis) ([place])    Multiple sclerosis ([place]) 2019   Officially diagnosed in the Fall   Neuromuscular disorder ([place])    Osteopenia 09/29/2011   T-1.6   Papanicolaou smear 10/2011   last abnormal 2011   Pulmonary embolism ([place]) 11/30/2017   Scaphoid fracture of wrist 2013   left   Seasonal allergic rhinitis    Shingles 09/2015   R hip   Vitamin B12 deficiency 06/19/2012   Vitamin D deficiency 12/16/2015   Vitamin D-OH level 12     SURGICAL HISTORY: Past Surgical History:  Procedure Laterality Date   APPENDECTOMY     CERVICAL BIOPSY  W/ LOOP ELECTRODE EXCISION  2009   ---paps normal since   COLONOSCOPY  multiple   scanned   ESOPHAGOGASTRODUODENOSCOPY  multiple   scanned   HEMICOLECTOMY  2006   IR ANGIOGRAM PULMONARY BILATERAL SELECTIVE  12/03/2017   IR ANGIOGRAM SELECTIVE EACH ADDITIONAL VESSEL  12/03/2017   IR ANGIOGRAM SELECTIVE EACH ADDITIONAL VESSEL  12/03/2017   IR INFUSION THROMBOL ARTERIAL INITIAL (MS)  12/03/2017   IR INFUSION THROMBOL ARTERIAL INITIAL (MS)  12/03/2017   IR IVC FILTER PLMT / S&I /IMG GUID/MOD SED  12/04/2017   IR THROMB F/U EVAL ART/VEN FINAL DAY (MS)  12/04/2017   IR US GUIDE VASC ACCESS RIGHT  12/03/2017   IVC FILTER REMOVAL N/A 07/11/2018    Procedure: IVC FILTER REMOVAL;  Surgeon: Serafina Mitchell, MD;  Location: [facility];  Service: Cardiovascular;  Laterality: N/A;   IVC VENOGRAPHY N/A 07/11/2018   Procedure: IVC Venography;  Surgeon: Serafina Mitchell, MD;  Location: [facility];  Service: Cardiovascular;  Laterality: N/A;   LEEP  2011   --done in [place] Right 11/30/2017   Procedure: PERIPHERAL VASCULAR BALLOON ANGIOPLASTY;  Surgeon: Serafina Mitchell, MD;  Location: [facility];  Service: Cardiovascular;  Laterality: Right;   PERIPHERAL VASCULAR THROMBECTOMY N/A 11/29/2017   Procedure: PERIPHERAL VASCULAR THROMBECTOMY - THROMBOLYSIS;  Surgeon: Serafina Mitchell, MD;  Location: [facility];  Service: Cardiovascular;  Laterality: N/A;  LYSIS CATHETER PLACEMENT ONLY   PERIPHERAL VASCULAR THROMBECTOMY N/A 11/30/2017   Procedure:  PERIPHERAL VASCULAR THROMBECTOMY - Lysis Recheck;  Surgeon: Serafina Mitchell, MD;  Location: [facility];  Service: Cardiovascular;  Laterality: N/A;   WISDOM TOOTH EXTRACTION      SOCIAL HISTORY: Social History   Socioeconomic History   Marital status: Married    Spouse name: Marcello Moores   Number of children: 0   Years of education: 13   Highest education level: Associate degree: occupational, Hotel manager, or vocational program  Occupational History   Occupation: Hydrographic surveyor: Theme park manager  Tobacco Use   Smoking status: Former    Packs/day: 1.00    Years: 4.00    Pack years: 4.00    Types: Cigarettes    Quit date: 11/18/1998    Years since quitting: 22.5   Smokeless tobacco: Never  Vaping Use   Vaping Use: Never used  Substance and Sexual Activity   Alcohol use: No    Alcohol/week: 0.0 standard drinks   Drug use: Yes    Frequency: 4.0 times per week    Types: Marijuana    Comment: edibles, occasionally smokes, at bedtime   Sexual activity: Yes    Partners: Male    Birth control/protection: I.U.D.     Comment: Mirena inserted 01/2016  Other Topics Concern   Not on file  Social History Narrative   The patient is divorced.     Re-married Marcello Moores, partner of 10 years, on 10/10/16. 2 dogs, 1 cat   No children - doesn't want any   Curator for [employer].   Moved from [place] to [place] in 2013.   Past smoker   No alcohol   2-3 caffeinated beverages a day   She reports she is compliant with sunscreen given her increased risk of sun damage and skin cancer (no longer on azathioprine)      Updated 12/31/20   Social Determinants of Health   Financial Resource Strain: Not on file  Food Insecurity: Not on file  Transportation Needs: Not on file  Physical Activity: Not on file  Stress: Not on file  Social Connections: Not on file  Intimate Partner Violence: Not on file    FAMILY HISTORY: Family History  Problem Relation Age of Onset   Hypertension Mother    Breast cancer Mother 3   Bone cancer Mother        metastatic from breast   Depression Mother    Hypothyroidism Mother    Colon polyps Father    Diabetes Father        borderline--resolved 2020   Hypertension Father        off meds now   Hyperlipidemia Father        off meds now   Fuch's dystrophy Father    Colon cancer Paternal Grandfather    Multiple sclerosis Sister        affecting brain only   Alcohol abuse Sister    Fuch's dystrophy Sister    Stroke Maternal Grandmother    Lung cancer Maternal Grandmother    Hypertension Maternal Grandmother    Heart disease Paternal Grandmother    Hypertension Paternal Grandmother    Hypertension Sister    Heart disease Sister        poss MI in 75's    ALLERGIES:  is allergic to iron, venofer [iron sucrose], ibuprofen, morphine and related, prednisone, and adhesive [tape].  MEDICATIONS:  Current Outpatient Medications  Medication Sig Dispense Refill   acetaminophen (TYLENOL) 500 MG tablet Take 1,000 mg  by mouth every 6 (six) hours as needed  for headache (pain). Reported on 12/15/2015 (Patient not taking: No sig reported)     apixaban (ELIQUIS) 5 MG TABS tablet Take 1 tablet (5 mg total) by mouth 2 (two) times daily. 60 tablet 11   baclofen (LIORESAL) 10 MG tablet Take 1 tablet (10 mg total) by mouth 3 (three) times daily as needed for muscle spasms. 30 each 0   buPROPion (WELLBUTRIN SR) 100 MG 12 hr tablet Take 1 tablet (100 mg total) by mouth 2 (two) times daily. 180 tablet 3   cetirizine (ZYRTEC) 10 MG tablet Take 10 mg by mouth at bedtime.      CHOLECALCIFEROL PO Take 5,000 Units by mouth 2 (two) times daily.     colestipol (COLESTID) 5 g packet TAKE 5 GRAMS BY MOUTH DAILY (Patient not taking: No sig reported) 30 each 5   cyanocobalamin (,VITAMIN B-12,) 1000 MCG/ML injection Inject 1 mL (1,000 mcg total) into the skin every 14 (fourteen) days. 10 mL 2   dalfampridine 10 MG TB12 TAKE 1 TABLET BY MOUTH  EVERY 12 HOURS 60 tablet 1   diphenoxylate-atropine (LOMOTIL) 2.5-0.025 MG tablet 1 to 2 tablets 4 times a day as needed for diarrhea 90 tablet 0   escitalopram (LEXAPRO) 10 MG tablet TAKE 1 TABLET BY MOUTH AT  BEDTIME 90 tablet 3   fenofibrate 160 MG tablet TAKE 1 TABLET BY MOUTH AT  BEDTIME 90 tablet 1   gabapentin (NEURONTIN) 100 MG capsule TAKE 3 CAPSULES(300 MG) BY MOUTH AT BEDTIME (Patient taking differently: Take 100 mg by mouth at bedtime.) 180 capsule 0   Insulin Syringe 27G X 1/2" 1 ML MISC 1 each by Does not apply route every 14 (fourteen) days. For [place] B12 injection 24 each 1   levonorgestrel (MIRENA) 20 MCG/24HR IUD 1 each by Intrauterine route once. Implanted April 2017     NON FORMULARY Take 1 each by mouth at bedtime. CBD/ cannabis     ocrelizumab 600 mg in sodium chloride 0.9 % 500 mL Inject 600 mg into the vein every 6 (six) months.     OVER THE COUNTER MEDICATION Apply 1 application topically daily as needed (arthritis pain). CBD Oil     traMADol (ULTRAM) 50 MG tablet Take 1-2 tablets (50-100 mg total) by mouth every  6 (six) hours as needed. 15 tablet 0   vedolizumab (ENTYVIO) 300 MG injection Inject 300 mg into the vein every 28 (twenty-eight) days. 300 each 6   No current facility-administered medications for this visit.    REVIEW OF SYSTEMS:   10 Point review of Systems was done is negative except as noted above.   PHYSICAL EXAMINATION: ECOG PERFORMANCE STATUS: 1 - Symptomatic but completely ambulatory  NAD GENERAL:alert, in no acute distress and comfortable SKIN: no acute rashes, no significant lesions EYES: conjunctiva are pink and non-injected, sclera anicteric OROPHARYNX: MMM, no exudates, no oropharyngeal erythema or ulceration NECK: supple, no JVD LYMPH:  no palpable lymphadenopathy in the cervical, axillary or inguinal regions LUNGS: clear to auscultation b/l with normal respiratory effort HEART: regular rate & rhythm ABDOMEN:  normoactive bowel sounds , non tender, not distended. Extremity: no pedal edema PSYCH: alert & oriented x 3 with fluent speech NEURO: no focal motor/sensory deficits   LABORATORY DATA:  I have reviewed the data as listed  . CBC Latest Ref Rng & Units 05/26/2021 02/12/2021 12/12/2020  WBC 4.0 - 10.5 K/uL 11.0(H) 10.6(H) 6.9  Hemoglobin 12.0 - 15.0 g/dL  12.8 12.7 9.1(L)  Hematocrit 36.0 - 46.0 % 38.6 40.0 30.8(L)  Platelets 150 - 400 K/uL 418(H) 479(H) 446(H)   CBC    Component Value Date/Time   WBC 11.0 (H) 05/26/2021 1416   RBC 4.38 05/26/2021 1416   HGB 12.8 05/26/2021 1416   HGB 9.1 (L) 12/12/2020 0752   HGB 13.1 12/18/2019 0820   HCT 38.6 05/26/2021 1416   HCT 40.7 12/18/2019 0820   PLT 418 (H) 05/26/2021 1416   PLT 446 (H) 12/12/2020 0752   PLT 475 (H) 12/18/2019 0820   MCV 88.1 05/26/2021 1416   MCV 90 12/18/2019 0820   MCH 29.2 05/26/2021 1416   MCHC 33.2 05/26/2021 1416   RDW 14.6 05/26/2021 1416   RDW 13.0 12/18/2019 0820   LYMPHSABS 0.7 05/26/2021 1416   LYMPHSABS 1.1 12/18/2019 0820   MONOABS 1.2 (H) 05/26/2021 1416   EOSABS 0.1  05/26/2021 1416   EOSABS 0.1 12/18/2019 0820   BASOSABS 0.1 05/26/2021 1416   BASOSABS 0.0 12/18/2019 0820    CMP Latest Ref Rng & Units 02/12/2021 12/31/2020 12/12/2020  Glucose 70 - 99 mg/dL 90 76 86  BUN 6 - 20 mg/dL 15 - 13  Creatinine 0.44 - 1.00 mg/dL 0.82 - 0.78  Sodium 135 - 145 mmol/L 140 - 137  Potassium 3.5 - 5.1 mmol/L 4.1 - 4.0  Chloride 98 - 111 mmol/L 106 - 108  CO2 22 - 32 mmol/L 26 - 22  Calcium 8.9 - 10.3 mg/dL 9.0 - 8.4(L)  Total Protein 6.5 - 8.1 g/dL 7.1 - 7.0  Total Bilirubin 0.3 - 1.2 mg/dL 0.2(L) - 0.2(L)  Alkaline Phos 38 - 126 U/L 54 - 57  AST 15 - 41 U/L 16 - 20  ALT 0 - 44 U/L 20 - 19   Component     Latest Ref Rng & Units 01/09/2018  PTT Lupus Anticoagulant     0.0 - 51.9 sec 34.3  DRVVT     0.0 - 47.0 sec 53.9 (H)  Lupus Anticoag Interp      Comment:  Beta-2 Glycoprotein I Ab, IgG     0 - 20 GPI IgG units 9  Beta-2-Glycoprotein I IgM     0 - 32 GPI IgM units <9  Beta-2-Glycoprotein I IgA     0 - 25 GPI IgA units <9  Anticardiolipin Ab,IgG,Qn     0 - 14 GPL U/mL <9  Anticardiolipin Ab,IgM,Qn     0 - 12 MPL U/mL 13 (H)  Anticardiolipin Ab,IgA,Qn     0 - 11 APL U/mL <9    Lupus anticoagulant panel      The value has a corrected status.      No reference range information available      Resulting Lab: [facility]      Comments:           (NOTE)           No lupus anticoagulant was detected  RADIOGRAPHIC STUDIES: I have personally reviewed the radiological images as listed and agreed with the findings in the report. DG Wrist Complete Right  Result Date: 05/13/2021 CLINICAL DATA:  [place] today with trauma to the hand and wrist. Generalized pain. EXAM: RIGHT WRIST - COMPLETE 3+ VIEW COMPARISON:  None. FINDINGS: There is no evidence of fracture or dislocation. There is no evidence of arthropathy or other focal bone abnormality. Soft tissues are unremarkable. IMPRESSION: Normal radiographs. Electronically Signed   By: Nelson Chimes  M.D.   On: 05/13/2021 14:52   DG Hand Complete Right  Result Date: 05/13/2021 CLINICAL DATA:  [place] today with trauma to the hand. Generalized pain. EXAM: RIGHT HAND - COMPLETE 3+ VIEW COMPARISON:  None. FINDINGS: There is no evidence of fracture or dislocation. There is no evidence of arthropathy or other focal bone abnormality. Soft tissues are unremarkable. IMPRESSION: Normal radiographs. Electronically Signed   By: Nelson Chimes M.D.   On: 05/13/2021 14:51     ASSESSMENT & PLAN:   Lindsay Mccarthy is a 45 y.o. caucasian female with   1. Acute DVT and Submassive PE No definitive provocation of DVT. PE likely from dislodged DVT after mechanical thrombectomy and balloon venoplasty Has IVC filter placed.  On Eliquis since 12/2017 Non smoker. No recent use of hormonal contracept. Prothrombin gene mutation/Factor V leiden mutation negative, Anti-phoshopholipid ab negative. Jak2 testing results negative. Lupus negative. Based on results, elevated platelets likely had a reactive cause due to inflammation from Crohn's and elevated iron.   Body mass index is 38.48 kg/m. - obesity could an additional contributing risk factor. 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  2. Elevated platelets  Have been elevated 300-800K for over a year  -likely reactive to iron deficiency anemia and inflammation from Crohn's disease. Platelets have improved in tandem with improvement in iron levels.  3. Iron Deficiency Anemia . Lab Results  Component Value Date   IRON 46 05/26/2021   TIBC 505 (H) 05/26/2021   IRONPCTSAT 9 (L) 05/26/2021   (Iron and TIBC)  Lab Results  Component Value Date   FERRITIN 86 05/26/2021  -on ferrous sulfate BID. Tolerating well -continue PO ironto maintain ferritin levels >50. -if still low in 3 months would consider IV iron replacement.  4. Vitamin B12 deficiency   -seen at 7741 on 01/09/18 labs  -Currently on every 14 days B12 injections with PCP, will continue    5. Crohn's Disease -Mx by Dr. Carlean Purl -On Imuran and Entyvio   PLAN:  -Discussed pt labs today,  Lab results today 05/26/2021 of CBC w/diff-no new anemia hemoglobin 12.8 thrombocytosis improved with platelets down to 418k.  Improving internal with iron replacement.  CMP unremarkable B12 level 243 Ferritin at 86 with an iron saturation of 9%. -Continue 1000 mcg Vitamin B12 [place] every 2 weeks. -Continue Eliquis -Patient currently has no anemia and her ferritin levels are reasonable with low iron saturation likely in the setting of some element of chronic inflammation related to her Crohn's disease. -Would recommend oral iron replacement iron polysaccharide to delay need for additional IV iron replacement.  FOLLOW UP:  RTC with Dr Irene Limbo with labs in 6 months     The total time spent in the appointment was 20 minutes and more than 50% was on counseling and direct patient cares.  All of the patient's questions were answered with apparent satisfaction. The patient knows to call the clinic with any problems, questions or concerns.    Sullivan Lone MD [place] AAHIVMS [facility]  (Office):       [phone] (Work cell):  [phone] (Fax):           [phone]  05/25/2021 7:22 PM  I, Reinaldo Raddle, am acting as scribe for Dr. Sullivan Lone, MD.   .I have reviewed the above documentation for accuracy and completeness, and I agree with the above. Marland Kitchen  Brunetta Genera MD

## 2021-05-26 ENCOUNTER — Other Ambulatory Visit: Payer: Self-pay

## 2021-05-26 ENCOUNTER — Inpatient Hospital Stay: Payer: 59

## 2021-05-26 ENCOUNTER — Inpatient Hospital Stay: Payer: 59 | Attending: Hematology | Admitting: Hematology

## 2021-05-26 VITALS — BP 131/85 | HR 92 | Temp 98.4°F | Resp 18 | Wt 224.2 lb

## 2021-05-26 DIAGNOSIS — D509 Iron deficiency anemia, unspecified: Secondary | ICD-10-CM | POA: Insufficient documentation

## 2021-05-26 DIAGNOSIS — D500 Iron deficiency anemia secondary to blood loss (chronic): Secondary | ICD-10-CM | POA: Diagnosis not present

## 2021-05-26 DIAGNOSIS — E538 Deficiency of other specified B group vitamins: Secondary | ICD-10-CM

## 2021-05-26 LAB — VITAMIN B12: Vitamin B-12: 243 pg/mL (ref 180–914)

## 2021-05-26 LAB — IRON AND TIBC
Iron: 46 ug/dL (ref 41–142)
Saturation Ratios: 9 % — ABNORMAL LOW (ref 21–57)
TIBC: 505 ug/dL — ABNORMAL HIGH (ref 236–444)
UIBC: 459 ug/dL — ABNORMAL HIGH (ref 120–384)

## 2021-05-26 LAB — CBC WITH DIFFERENTIAL/PLATELET
Abs Immature Granulocytes: 0.03 10*3/uL (ref 0.00–0.07)
Basophils Absolute: 0.1 10*3/uL (ref 0.0–0.1)
Basophils Relative: 1 %
Eosinophils Absolute: 0.1 10*3/uL (ref 0.0–0.5)
Eosinophils Relative: 1 %
HCT: 38.6 % (ref 36.0–46.0)
Hemoglobin: 12.8 g/dL (ref 12.0–15.0)
Immature Granulocytes: 0 %
Lymphocytes Relative: 6 %
Lymphs Abs: 0.7 10*3/uL (ref 0.7–4.0)
MCH: 29.2 pg (ref 26.0–34.0)
MCHC: 33.2 g/dL (ref 30.0–36.0)
MCV: 88.1 fL (ref 80.0–100.0)
Monocytes Absolute: 1.2 10*3/uL — ABNORMAL HIGH (ref 0.1–1.0)
Monocytes Relative: 11 %
Neutro Abs: 8.8 10*3/uL — ABNORMAL HIGH (ref 1.7–7.7)
Neutrophils Relative %: 81 %
Platelets: 418 10*3/uL — ABNORMAL HIGH (ref 150–400)
RBC: 4.38 MIL/uL (ref 3.87–5.11)
RDW: 14.6 % (ref 11.5–15.5)
WBC: 11 10*3/uL — ABNORMAL HIGH (ref 4.0–10.5)
nRBC: 0 % (ref 0.0–0.2)

## 2021-05-26 LAB — CMP (CANCER CENTER ONLY)
ALT: 12 U/L (ref 0–44)
AST: 17 U/L (ref 15–41)
Albumin: 3.4 g/dL — ABNORMAL LOW (ref 3.5–5.0)
Alkaline Phosphatase: 48 U/L (ref 38–126)
Anion gap: 8 (ref 5–15)
BUN: 12 mg/dL (ref 6–20)
CO2: 25 mmol/L (ref 22–32)
Calcium: 9.7 mg/dL (ref 8.9–10.3)
Chloride: 106 mmol/L (ref 98–111)
Creatinine: 0.85 mg/dL (ref 0.44–1.00)
GFR, Estimated: >60 mL/min (ref >60)
Glucose, Bld: 102 mg/dL — ABNORMAL HIGH (ref 70–99)
Potassium: 3.7 mmol/L (ref 3.5–5.1)
Sodium: 139 mmol/L (ref 135–145)
Total Bilirubin: <0.2 mg/dL — ABNORMAL LOW (ref 0.3–1.2)
Total Protein: 7.1 g/dL (ref 6.5–8.1)

## 2021-05-26 LAB — FERRITIN: Ferritin: 86 ng/mL (ref 11–307)

## 2021-05-26 NOTE — Telephone Encounter (Signed)
Orders were faxed.

## 2021-05-27 ENCOUNTER — Telehealth: Payer: Self-pay | Admitting: Hematology

## 2021-05-27 NOTE — Telephone Encounter (Signed)
Scheduled follow-up appointment per 7/26 los. Patient is aware.

## 2021-06-01 ENCOUNTER — Encounter: Payer: Self-pay | Admitting: Hematology

## 2021-06-05 ENCOUNTER — Telehealth: Payer: Self-pay

## 2021-06-05 NOTE — Telephone Encounter (Signed)
Contacted pt per Dr Irene Limbo: Let pt know she currently has no anemia and her ferritin levels are reasonable 78 with low iron saturation likely in the setting of some element of chronic inflammation related to her Crohn's disease.  -Would recommend oral iron replacement iron polysaccharide/ferrous sulfate 1 tab po BID to delay need for additional IV iron replacement.  -if patient is developing significant fatigue --will need to consideradditional IV Iron   Pt acknowledged information and verbalized understanding.

## 2021-06-14 NOTE — Progress Notes (Signed)
NEUROLOGY FOLLOW UP OFFICE NOTE  Lindsay Mccarthy [id number]  Assessment/Plan:   Multiple sclerosis - overall doing well.  However, because she feels a decline for the 6 weeks prior to next Ocrevus infusion, she would like to switch DMT to [place].  In regards to prior immunosuppressant therapy for her Crohn's, she tended to do better with more frequent dosing (such as every 4 weeks)  DMT: Plan to switch to Kesimpta - check hepatitis B panel and quantitative immunoglobulin panel.  Will also check CBC with diff, CMP and vit D level.  Plan would be to repeat quantitative immunoglobulin panel and vitamin D level in 6 months. MRI of brain and cervical spine with and without contrast to establish new baseline D3 5000 IU BID, gabapentin 145m QHS, Ampyra, (baclofen if needed) Follow up 6 months.  Subjective:  Lindsay Sizemoreis a 45year old right-handed woman with Crohn's disease, depression, and anxiety who follows up for multiple sclerosis.  She is accompanied by her husband who supplements history.     UPDATE: Current disease modifying therapy:  Ocrevus (first dose on 12/15/18, followed by the second dose on 12/29/18.  Last infusion in April).   Other medication:  D3 5000 IU BID, B12 injections, Ampyra, gabapentin 1082mat bedtime, baclofen (rarely), Wellbutrin, Lexapro 108m,Colestid, Bentayl, Benadryl, Eliquis, MirKizzie Ideabs: 12/18/2020:  IgA 155, IgG 1,026, IgM 35; vit D 48.07.    Overall, she is feeling well.  Symptoms are stable.  Notes some exacerbation of weakness with heat.  She notes that she doesn't feel well for the 6 weeks prior to next infusion.  She would like to consider switching to Kesimpta.   Vision:  No issues Motor:  No new issues Sensory:  numbness and tingling in legs  Pain:  Spasms in legs.. Gait:  Gait improved since restarting Ampyra.  Now usually ambulates without cane unless outside. Bowel/Bladder:  Occasional urinary incontinence when stressed.   Crohn's disease. Fatigue:  Some fatigue. Cognition:  Trouble multi-tasking.  She underwent neuropsychological testing on 09/04/2019.  Performance fell largely within normal limits.  She did exhibit a weakness across aspects of attention/concentration and some performance variability across processing speed.  Performance involving complex attention, executive functioning, receptive and expressive language, visual-spatial abilities and verbal and visual learning and memory were within normal range. Mood:  Stable   HISTORY: She reports history of various neurologic symptoms since her late 20s22s  Urinary incontinence:  It started about a year ago and has gotten worse.  It occurs suddenly, once or twice a month, usually at home and while standing, such as while washing dishes with the water running.  It occurs during fatigued or emotional stress as well.  She denies increased urinary frequency or dysuria.  No perineal numbness.  No low back pain.   - She reports poor night vision with difficulty seeing while driving at night. She has trouble focusing.  Recent eye exam showed poor "refocusing", which would normally be seen in an older adult.  Her sister had Fuchs dystrophy but she doesn't have it. -  She reports numbness and tingling in her hands when she wakes up in the morning or in the evening after work. -  She reports abnormal gait.  It feels like her feet are stuck in cement.  She has trouble lifting her feet.  It causes hip pain.  She trips over her feet.  It is worse when there is an extreme change in temperature.  She started  having trouble in her mid-30s.  Worse over the past 2 years.  She reports a stabbing pain in her hips or knees.  She reports previously being diagnosed with a pinched nerve in the back that caused a left foot drop in the 20s. She had PT and injections which was ineffective.  No numbness in the legs.   -  When she throws a ball, her hand doesn't release immediately.  There is a  delay until she is able to release the ball.  She has similar problems with her left hand.  No neck pain. -  She reports fatigued.  She needs to take a nap during the day.  Prior sleep study was normal.   One of her two sisters has multiple sclerosis.   Other history:  She has Crohn's disease.  Earlier this year, she developed a DVT in her right leg.  She also was found to have B12 deficiency and is being treated with injections.   01/09/18 LABS:  B12 143, Sed rate 18.  Hypercoagulable panel unremarkable, including lupus anticoagulant panel, dRVVT mix, beta-2-glycoprotein, cardiolipin antibodies, prothrombin gene mutation, factor V Leiden.  NMO/AQP4+ antibody was negative.  Her insurance would not cover anti-MOG antibody testing.   09/2018 LABS:  JC Virus antibody was positive with index of 3.26.  Quantiferon-TB Gold Plus negative.  She tested negative for Hep B.      07/22/2018:  MRI Cervical & Thoracic spine w/wo contrast:  Multiple T2/STIR hyperintense signal within the spinal cord at C2, C3-C4, C6-C7, T1, T3-T4, T6-T7, and T12 levels, all non-enhancing.  She also demonstrates degenerative spinal stenosis at C4-C5 and C5-C6 with mild spinal cord mass effect. 07/24/2018:  MRI Brain w/wo contrast:  Mild hyperintense in the periventricular and juxtacortical white matter with signal running perpendicular from the lateral ventricle and involving the corpus callosum, non-enhancing. 04/05/2020 MRI CERVICAL & THORACIC SPINE W WO:  Chronic patchy multifocal cord signal abnormality throughout cervical and thoracic cord, overall stable compared to 2019.  No active lesions.  Degenerative cervical spondylosis at C4-5 and C5-6 with mild to moderate spinal stenosis and moderate left C5 foraminal narrowing.  Swelling in ankles. 05/02/2020 MRI BRAIN W WO:  Unchanged mild subcortical/juxtacortical and deep periventricular white matter foci of signal abnormality consistent with the provided history of multiple  sclerosis. No new or enhancing lesion is identified.   Past medications:  gabapentin   PAST MEDICAL HISTORY: Past Medical History:  Diagnosis Date   Anemia    related to Crohns flares   Arm DVT (deep venous thromboembolism), acute, right ([place]) 11/29/2017   Arthritis    knees, feet, hands, wrists, related to Crohns flare   Asthma    Childhood   B12 deficiency    monitored/treated by Dr. Carlean Purl   Cervical dysplasia    Clotting disorder ([place])    on eliquis hx dvt/ pulmonary embolism   COVID 04/13/2021   Crohn disease ([place])    Crohn's disease of small intestine ([place]) 06/24/2012   Diagnosed 1999, in [place]. Ileitis only then. Treated with Imuran Remicade and prednisone.  Noncompliant with therapy. 2004 return to care and was treated with Remicade prednisone Pentasa Cipro and Flagyl. 2006 status post right hemicolectomy. Subsequently treated with azathioprine and Cimzia. 200 mg every other week.  azathioprine was added in 2010. Prometheus TP MT enzyme was negative.   Depression    DVT of upper extremity (deep vein thrombosis) ([place]) 11/28/2017   Eczema    arms and behind knees, worse  in winter   Generalized anxiety disorder    History of recurrent UTI (urinary tract infection)    HPV (human papilloma virus) infection    Hyperlipemia    Hypertriglyceridemia 07/03/2012   10/2010 labs showed level of 753 with total cholesterol 180 and HDL 41' Labs 05/2013--TG 180    Major depressive disorder in remission ([place]) 10/03/2013   MS (multiple sclerosis) ([place])    Multiple sclerosis ([place]) 2019   Officially diagnosed in the Fall   Neuromuscular disorder ([place])    Osteopenia 09/29/2011   T-1.6   Papanicolaou smear 10/2011   last abnormal 2011   Pulmonary embolism ([place]) 11/30/2017   Scaphoid fracture of wrist 2013   left   Seasonal allergic rhinitis    Shingles 09/2015   R hip   Vitamin B12 deficiency 06/19/2012   Vitamin D deficiency 12/16/2015   Vitamin D-OH level 12      MEDICATIONS: Current Outpatient Medications on File Prior to Visit  Medication Sig Dispense Refill   acetaminophen (TYLENOL) 500 MG tablet Take 1,000 mg by mouth every 6 (six) hours as needed for headache (pain). Reported on 12/15/2015 (Patient not taking: No sig reported)     apixaban (ELIQUIS) 5 MG TABS tablet Take 1 tablet (5 mg total) by mouth 2 (two) times daily. 60 tablet 11   baclofen (LIORESAL) 10 MG tablet Take 1 tablet (10 mg total) by mouth 3 (three) times daily as needed for muscle spasms. 30 each 0   buPROPion (WELLBUTRIN SR) 100 MG 12 hr tablet Take 1 tablet (100 mg total) by mouth 2 (two) times daily. 180 tablet 3   cetirizine (ZYRTEC) 10 MG tablet Take 10 mg by mouth at bedtime.      CHOLECALCIFEROL PO Take 5,000 Units by mouth 2 (two) times daily.     colestipol (COLESTID) 5 g packet TAKE 5 GRAMS BY MOUTH DAILY (Patient not taking: No sig reported) 30 each 5   cyanocobalamin (,VITAMIN B-12,) 1000 MCG/ML injection Inject 1 mL (1,000 mcg total) into the skin every 14 (fourteen) days. 10 mL 2   dalfampridine 10 MG TB12 TAKE 1 TABLET BY MOUTH  EVERY 12 HOURS 60 tablet 1   diphenoxylate-atropine (LOMOTIL) 2.5-0.025 MG tablet 1 to 2 tablets 4 times a day as needed for diarrhea 90 tablet 0   escitalopram (LEXAPRO) 10 MG tablet TAKE 1 TABLET BY MOUTH AT  BEDTIME 90 tablet 3   fenofibrate 160 MG tablet TAKE 1 TABLET BY MOUTH AT  BEDTIME 90 tablet 1   gabapentin (NEURONTIN) 100 MG capsule TAKE 3 CAPSULES(300 MG) BY MOUTH AT BEDTIME (Patient taking differently: Take 100 mg by mouth at bedtime.) 180 capsule 0   Insulin Syringe 27G X 1/2" 1 ML MISC 1 each by Does not apply route every 14 (fourteen) days. For [place] B12 injection 24 each 1   levonorgestrel (MIRENA) 20 MCG/24HR IUD 1 each by Intrauterine route once. Implanted April 2017     NON FORMULARY Take 1 each by mouth at bedtime. CBD/ cannabis     ocrelizumab 600 mg in sodium chloride 0.9 % 500 mL Inject 600 mg into the vein every 6  (six) months.     OVER THE COUNTER MEDICATION Apply 1 application topically daily as needed (arthritis pain). CBD Oil     traMADol (ULTRAM) 50 MG tablet Take 1-2 tablets (50-100 mg total) by mouth every 6 (six) hours as needed. 15 tablet 0   vedolizumab (ENTYVIO) 300 MG injection Inject 300 mg into  the vein every 28 (twenty-eight) days. 300 each 6   No current facility-administered medications on file prior to visit.    ALLERGIES: Allergies  Allergen Reactions   Iron Other (See Comments)    Blood in stool/Rectal Bleeding   Venofer [Iron Sucrose] Other (See Comments)    Fever, infusion reaction    Ibuprofen Other (See Comments)    Avoids due to Crohns disease   Morphine And Related Itching    itchy   Prednisone Other (See Comments)    "crazy", hallucinations Tolerates methylprednisolone   Adhesive [Tape] Rash    Rash with electrodes    FAMILY HISTORY: Family History  Problem Relation Age of Onset   Hypertension Mother    Breast cancer Mother 72   Bone cancer Mother        metastatic from breast   Depression Mother    Hypothyroidism Mother    Colon polyps Father    Diabetes Father        borderline--resolved 2020   Hypertension Father        off meds now   Hyperlipidemia Father        off meds now   Fuch's dystrophy Father    Colon cancer Paternal Grandfather    Multiple sclerosis Sister        affecting brain only   Alcohol abuse Sister    Fuch's dystrophy Sister    Stroke Maternal Grandmother    Lung cancer Maternal Grandmother    Hypertension Maternal Grandmother    Heart disease Paternal Grandmother    Hypertension Paternal Grandmother    Hypertension Sister    Heart disease Sister        poss MI in 70's      Objective:  Blood pressure 134/84, pulse 93, height 5' 4"  (1.626 m), weight 222 lb (100.7 kg), SpO2 96 %. General: No acute distress.  Patient appears well-groomed.   Head:  Normocephalic/atraumatic Eyes:  Fundi examined but not visualized Neck:  supple, no paraspinal tenderness, full range of motion Heart:  Regular rate and rhythm Lungs:  Clear to auscultation bilaterally Back: No paraspinal tenderness Neurological Exam: alert and oriented to person, place, and time. Speech fluent and not dysarthric, language intact.  CN II-XII intact. Bulk normal, increased tone in lower extremities, muscle strength 5/5 throughout.  Sensation to pinprick reduced in right upper extremity and left lower extremities and reduced vibratory sensation in left lower extremity.  Deep tendon reflexes 2+ throughout, bilateral Babinski.  Finger to nose testing intact.  Spastic gait. Timed 25 foot walk 8.66 seconds.  Romberg with mild sway.   Metta Clines, DO  CC: Rita Ohara, MD

## 2021-06-15 ENCOUNTER — Telehealth: Payer: Self-pay

## 2021-06-15 ENCOUNTER — Other Ambulatory Visit (INDEPENDENT_AMBULATORY_CARE_PROVIDER_SITE_OTHER): Payer: 59

## 2021-06-15 ENCOUNTER — Other Ambulatory Visit: Payer: Self-pay

## 2021-06-15 ENCOUNTER — Encounter: Payer: Self-pay | Admitting: Neurology

## 2021-06-15 ENCOUNTER — Ambulatory Visit (INDEPENDENT_AMBULATORY_CARE_PROVIDER_SITE_OTHER): Payer: 59 | Admitting: Neurology

## 2021-06-15 VITALS — BP 134/84 | HR 93 | Ht 64.0 in | Wt 222.0 lb

## 2021-06-15 DIAGNOSIS — G35 Multiple sclerosis: Secondary | ICD-10-CM

## 2021-06-15 DIAGNOSIS — E559 Vitamin D deficiency, unspecified: Secondary | ICD-10-CM | POA: Diagnosis not present

## 2021-06-15 LAB — CBC WITH DIFFERENTIAL/PLATELET
Basophils Absolute: 0 10*3/uL (ref 0.0–0.1)
Basophils Relative: 0.2 % (ref 0.0–3.0)
Eosinophils Absolute: 0.1 10*3/uL (ref 0.0–0.7)
Eosinophils Relative: 1.4 % (ref 0.0–5.0)
HCT: 39.1 % (ref 36.0–46.0)
Hemoglobin: 12.7 g/dL (ref 12.0–15.0)
Lymphocytes Relative: 10.3 % — ABNORMAL LOW (ref 12.0–46.0)
Lymphs Abs: 0.8 10*3/uL (ref 0.7–4.0)
MCHC: 32.5 g/dL (ref 30.0–36.0)
MCV: 87.8 fl (ref 78.0–100.0)
Monocytes Absolute: 0.6 10*3/uL (ref 0.1–1.0)
Monocytes Relative: 7.4 % (ref 3.0–12.0)
Neutro Abs: 6.3 10*3/uL (ref 1.4–7.7)
Neutrophils Relative %: 80.7 % — ABNORMAL HIGH (ref 43.0–77.0)
Platelets: 524 10*3/uL — ABNORMAL HIGH (ref 150.0–400.0)
RBC: 4.46 Mil/uL (ref 3.87–5.11)
RDW: 14.9 % (ref 11.5–15.5)
WBC: 7.8 10*3/uL (ref 4.0–10.5)

## 2021-06-15 LAB — COMPREHENSIVE METABOLIC PANEL
ALT: 13 U/L (ref 0–35)
AST: 14 U/L (ref 0–37)
Albumin: 3.9 g/dL (ref 3.5–5.2)
Alkaline Phosphatase: 50 U/L (ref 39–117)
BUN: 7 mg/dL (ref 6–23)
CO2: 25 mEq/L (ref 19–32)
Calcium: 9.3 mg/dL (ref 8.4–10.5)
Chloride: 104 mEq/L (ref 96–112)
Creatinine, Ser: 0.81 mg/dL (ref 0.40–1.20)
GFR: 88.91 mL/min (ref >60.00)
Glucose, Bld: 74 mg/dL (ref 70–99)
Potassium: 3.6 mEq/L (ref 3.5–5.1)
Sodium: 139 mEq/L (ref 135–145)
Total Bilirubin: 0.3 mg/dL (ref 0.2–1.2)
Total Protein: 6.9 g/dL (ref 6.0–8.3)

## 2021-06-15 LAB — VITAMIN D 25 HYDROXY (VIT D DEFICIENCY, FRACTURES): VITD: 58.8 ng/mL (ref 30.00–100.00)

## 2021-06-15 NOTE — Telephone Encounter (Signed)
Wallis Bamberg Key: [id number] help? Call us at [phone] Status New(Not sent to plan) Drug Dalfampridine ER 10MG er tablets Form [employer] Electronic Prior Authorization Form (2017 NCPDP)

## 2021-06-15 NOTE — Patient Instructions (Addendum)
Plan is to switch from [place] to Kesimpta  Check Hepatitis B panel and quantitative immunoglobulin panel, vitamin D panel, CBC with diff and CMP Check MRI of brain and cervical spine with and without contrast Follow up 6 months.

## 2021-06-16 LAB — ACUTE HEP PANEL AND HEP B SURFACE AB
HEPATITIS C ANTIBODY REFILL$(REFL): NONREACTIVE
Hep A IgM: NONREACTIVE
Hep B C IgM: NONREACTIVE
Hepatitis B Surface Ag: NONREACTIVE
SIGNAL TO CUT-OFF: 0.01 (ref <1.00)

## 2021-06-16 LAB — REFLEX TIQ

## 2021-06-16 LAB — IGG, IGA, IGM
IgG (Immunoglobin G), Serum: 1037 mg/dL (ref 600–1640)
IgM, Serum: 32 mg/dL — ABNORMAL LOW (ref 50–300)
Immunoglobulin A: 144 mg/dL (ref 47–310)

## 2021-06-16 LAB — HEPATITIS C ANTIBODY
Hepatitis C Ab: NONREACTIVE
SIGNAL TO CUT-OFF: 0.01 (ref <1.00)

## 2021-06-17 ENCOUNTER — Other Ambulatory Visit: Payer: Self-pay | Admitting: Neurology

## 2021-06-19 ENCOUNTER — Other Ambulatory Visit: Payer: Self-pay

## 2021-06-19 DIAGNOSIS — G35 Multiple sclerosis: Secondary | ICD-10-CM

## 2021-06-19 NOTE — Progress Notes (Signed)
MRI Cervical Spine w/wo contrast added

## 2021-06-22 ENCOUNTER — Encounter: Payer: Self-pay | Admitting: Family Medicine

## 2021-06-22 ENCOUNTER — Other Ambulatory Visit: Payer: Self-pay | Admitting: *Deleted

## 2021-06-22 DIAGNOSIS — E781 Pure hyperglyceridemia: Secondary | ICD-10-CM

## 2021-06-22 MED ORDER — FENOFIBRATE 160 MG PO TABS
ORAL_TABLET | ORAL | 0 refills | Status: DC
Start: 2021-06-22 — End: 2021-07-07

## 2021-06-23 ENCOUNTER — Telehealth: Payer: Self-pay

## 2021-06-23 NOTE — Telephone Encounter (Signed)
New message    Pending    (Key: [id number]) Kesimpta 20MG/0.4ML auto-injectors   Form [employer] Electronic Prior Authorization Form (2017 NCPDP) Created 15 minutes ago Sent to Plan 4 minutes ago Plan Response 4 minutes ago Submit Clinical Questions less than a minute ago Determination Wait for Determination Please wait for [employer] 2017 NCPDP to return a determination.

## 2021-06-23 NOTE — Telephone Encounter (Signed)
F/u    Message from Plan  Request Reference Number: [id number].   KESIMPTA INJ 20/.4ML is approved through 06/23/2022.   Your patient may now fill this prescription and it will be covered.

## 2021-07-06 ENCOUNTER — Other Ambulatory Visit: Payer: Self-pay | Admitting: Family Medicine

## 2021-07-06 DIAGNOSIS — E781 Pure hyperglyceridemia: Secondary | ICD-10-CM

## 2021-07-07 ENCOUNTER — Ambulatory Visit
Admission: RE | Admit: 2021-07-07 | Discharge: 2021-07-07 | Disposition: A | Discharge status: Home / Self Care | Payer: 59 | Source: Ambulatory Visit | Attending: Neurology | Admitting: Neurology

## 2021-07-07 ENCOUNTER — Other Ambulatory Visit: Payer: Self-pay

## 2021-07-07 DIAGNOSIS — G35 Multiple sclerosis: Secondary | ICD-10-CM

## 2021-07-07 MED ORDER — GADOBENATE DIMEGLUMINE 529 MG/ML IV SOLN
15.0000 mL | Freq: Once | INTRAVENOUS | Status: AC | PRN
  Administered 2021-07-07: 15 mL via INTRAVENOUS

## 2021-07-10 ENCOUNTER — Telehealth: Payer: Self-pay

## 2021-07-10 ENCOUNTER — Other Ambulatory Visit: Payer: Self-pay | Admitting: Neurology

## 2021-07-10 MED ORDER — KESIMPTA 20 MG/0.4ML ~~LOC~~ SOAJ: 20.0000 mg | SUBCUTANEOUS | 5 refills | Status: DC

## 2021-07-10 NOTE — Telephone Encounter (Signed)
[facility]

## 2021-07-10 NOTE — Telephone Encounter (Signed)
Telephone call to pt, Advised of Approval for Kesimpta.  Pt will pick up titration dose from [place].  Kesimpa 3 injection in fridge for pt.  Dr.Jaffe if could add the script to be sent to her pharmacy.  Add note to start four weeks from 08/01/21.

## 2021-07-17 NOTE — Telephone Encounter (Signed)
Pt husband will come by today to pick up the samples of the Kesimpa

## 2021-07-17 NOTE — Telephone Encounter (Signed)
Samples In fridge.

## 2021-07-19 ENCOUNTER — Ambulatory Visit
Admission: RE | Admit: 2021-07-19 | Discharge: 2021-07-19 | Disposition: A | Discharge status: Home / Self Care | Payer: 59 | Source: Ambulatory Visit | Attending: Neurology | Admitting: Neurology

## 2021-07-19 ENCOUNTER — Other Ambulatory Visit: Payer: Self-pay

## 2021-07-19 DIAGNOSIS — G35 Multiple sclerosis: Secondary | ICD-10-CM

## 2021-07-19 MED ORDER — GADOBENATE DIMEGLUMINE 529 MG/ML IV SOLN
20.0000 mL | Freq: Once | INTRAVENOUS | Status: AC | PRN
  Administered 2021-07-19: 20 mL via INTRAVENOUS

## 2021-07-22 ENCOUNTER — Other Ambulatory Visit: Payer: Self-pay | Admitting: Family Medicine

## 2021-07-22 DIAGNOSIS — E781 Pure hyperglyceridemia: Secondary | ICD-10-CM

## 2021-08-06 ENCOUNTER — Ambulatory Visit: Payer: 59 | Admitting: Obstetrics and Gynecology

## 2021-08-15 ENCOUNTER — Other Ambulatory Visit: Payer: Self-pay

## 2021-08-15 ENCOUNTER — Emergency Department (HOSPITAL_BASED_OUTPATIENT_CLINIC_OR_DEPARTMENT_OTHER)
Admission: EM | Admit: 2021-08-15 | Discharge: 2021-08-15 | Disposition: A | Discharge status: Home / Self Care | Payer: 59 | Attending: Emergency Medicine | Admitting: Emergency Medicine

## 2021-08-15 ENCOUNTER — Encounter (HOSPITAL_BASED_OUTPATIENT_CLINIC_OR_DEPARTMENT_OTHER): Payer: Self-pay | Admitting: Emergency Medicine

## 2021-08-15 ENCOUNTER — Emergency Department (HOSPITAL_BASED_OUTPATIENT_CLINIC_OR_DEPARTMENT_OTHER): Payer: 59

## 2021-08-15 ENCOUNTER — Telehealth (HOSPITAL_BASED_OUTPATIENT_CLINIC_OR_DEPARTMENT_OTHER): Payer: Self-pay | Admitting: Emergency Medicine

## 2021-08-15 DIAGNOSIS — K5000 Crohn's disease of small intestine without complications: Secondary | ICD-10-CM | POA: Insufficient documentation

## 2021-08-15 DIAGNOSIS — G35 Multiple sclerosis: Secondary | ICD-10-CM | POA: Insufficient documentation

## 2021-08-15 DIAGNOSIS — Z79899 Other long term (current) drug therapy: Secondary | ICD-10-CM | POA: Diagnosis not present

## 2021-08-15 DIAGNOSIS — Z87891 Personal history of nicotine dependence: Secondary | ICD-10-CM | POA: Insufficient documentation

## 2021-08-15 DIAGNOSIS — R1084 Generalized abdominal pain: Secondary | ICD-10-CM | POA: Diagnosis present

## 2021-08-15 LAB — URINALYSIS, ROUTINE W REFLEX MICROSCOPIC
Bilirubin Urine: NEGATIVE
Glucose, UA: NEGATIVE mg/dL
Hgb urine dipstick: NEGATIVE
Ketones, ur: NEGATIVE mg/dL
Leukocytes,Ua: NEGATIVE
Nitrite: NEGATIVE
Protein, ur: NEGATIVE mg/dL
Specific Gravity, Urine: 1.006 (ref 1.005–1.030)
pH: 6 (ref 5.0–8.0)

## 2021-08-15 LAB — COMPREHENSIVE METABOLIC PANEL
ALT: 10 U/L (ref 0–44)
AST: 10 U/L — ABNORMAL LOW (ref 15–41)
Albumin: 3.7 g/dL (ref 3.5–5.0)
Alkaline Phosphatase: 56 U/L (ref 38–126)
Anion gap: 10 (ref 5–15)
BUN: 8 mg/dL (ref 6–20)
CO2: 27 mmol/L (ref 22–32)
Calcium: 8.7 mg/dL — ABNORMAL LOW (ref 8.9–10.3)
Chloride: 102 mmol/L (ref 98–111)
Creatinine, Ser: 0.7 mg/dL (ref 0.44–1.00)
GFR, Estimated: >60 mL/min (ref >60)
Glucose, Bld: 86 mg/dL (ref 70–99)
Potassium: 3.5 mmol/L (ref 3.5–5.1)
Sodium: 139 mmol/L (ref 135–145)
Total Bilirubin: 0.3 mg/dL (ref 0.3–1.2)
Total Protein: 6.8 g/dL (ref 6.5–8.1)

## 2021-08-15 LAB — CBC WITH DIFFERENTIAL/PLATELET
Abs Immature Granulocytes: 0.05 10*3/uL (ref 0.00–0.07)
Basophils Absolute: 0 10*3/uL (ref 0.0–0.1)
Basophils Relative: 0 %
Eosinophils Absolute: 0.1 10*3/uL (ref 0.0–0.5)
Eosinophils Relative: 1 %
HCT: 39.1 % (ref 36.0–46.0)
Hemoglobin: 12.5 g/dL (ref 12.0–15.0)
Immature Granulocytes: 0 %
Lymphocytes Relative: 6 %
Lymphs Abs: 0.8 10*3/uL (ref 0.7–4.0)
MCH: 27.9 pg (ref 26.0–34.0)
MCHC: 32 g/dL (ref 30.0–36.0)
MCV: 87.3 fL (ref 80.0–100.0)
Monocytes Absolute: 1.8 10*3/uL — ABNORMAL HIGH (ref 0.1–1.0)
Monocytes Relative: 14 %
Neutro Abs: 9.6 10*3/uL — ABNORMAL HIGH (ref 1.7–7.7)
Neutrophils Relative %: 79 %
Platelets: 462 10*3/uL — ABNORMAL HIGH (ref 150–400)
RBC: 4.48 MIL/uL (ref 3.87–5.11)
RDW: 13.4 % (ref 11.5–15.5)
WBC: 12.4 10*3/uL — ABNORMAL HIGH (ref 4.0–10.5)
nRBC: 0 % (ref 0.0–0.2)

## 2021-08-15 LAB — LIPASE, BLOOD: Lipase: 23 U/L (ref 11–51)

## 2021-08-15 MED ORDER — MORPHINE SULFATE (PF) 4 MG/ML IV SOLN
4.0000 mg | Freq: Once | INTRAVENOUS | Status: AC
  Administered 2021-08-15: 4 mg via INTRAVENOUS
  Filled 2021-08-15: qty 1

## 2021-08-15 MED ORDER — KETOROLAC TROMETHAMINE 30 MG/ML IJ SOLN: 30.0000 mg | Freq: Once | INTRAMUSCULAR | Status: DC

## 2021-08-15 MED ORDER — SODIUM CHLORIDE 0.9 % IV BOLUS
1000.0000 mL | Freq: Once | INTRAVENOUS | Status: AC
  Administered 2021-08-15: 1000 mL via INTRAVENOUS

## 2021-08-15 MED ORDER — IOHEXOL 300 MG/ML  SOLN
100.0000 mL | Freq: Once | INTRAMUSCULAR | Status: AC | PRN
  Administered 2021-08-15: 100 mL via INTRAVENOUS

## 2021-08-15 MED ORDER — DIPHENHYDRAMINE HCL 50 MG/ML IJ SOLN
25.0000 mg | Freq: Once | INTRAMUSCULAR | Status: AC
  Administered 2021-08-15: 25 mg via INTRAVENOUS
  Filled 2021-08-15: qty 1

## 2021-08-15 MED ORDER — METHYLPREDNISOLONE 4 MG PO TBPK: ORAL_TABLET | ORAL | 0 refills | Status: DC

## 2021-08-15 NOTE — ED Notes (Signed)
Patient transported to CT 

## 2021-08-15 NOTE — Discharge Instructions (Addendum)
It appears you are having a flare of your Crohn's colitis.  I am starting you on 5 days worth of methylprednisolone to try and decrease your symptoms.  Please follow-up with Dr. Carlean Purl as soon as you can to discuss your Crohn's disease and long-term treatment.  It was a pleasure to meet you today and I hope that you feel better.

## 2021-08-15 NOTE — ED Provider Notes (Signed)
[facility]ER[facility] DEPT Provider Note   CSN: [id number] Arrival date & time: 08/15/21  1239     History Chief Complaint  Patient presents with   Abdominal Pain    Lindsay Mccarthy is a 45 y.o. female with a past medical disease of MS and Crohn's colitis presenting today with a complaint of abdominal pain and distention.  Patient reports that 3 weeks ago she felt similarly however it resolved.  Now since Wednesday she has had excruciating abdominal pain that she describes as sharp.  3 weeks ago the pain was localized to her right side however now it is generalized.  Associated with nausea on Wednesday night.  No nausea or vomiting since.  Patient still having bowel movement, last bowel movement this morning.  No blood noted.  Denies fevers, saying that her highest temperature was 99.  No chills.  Patient does not get menstrual periods.  History of both small and large bowel resection.  Recently started new medication, Kesimpta, for her progressive MS.   Abdominal Pain Associated symptoms: nausea   Associated symptoms: no chest pain, no chills, no cough, no dysuria, no fever, no hematuria, no shortness of breath, no sore throat and no vomiting       Past Medical History:  Diagnosis Date   Anemia    related to Crohns flares   Arm DVT (deep venous thromboembolism), acute, right ([place]) 11/29/2017   Arthritis    knees, feet, hands, wrists, related to Crohns flare   Asthma    Childhood   B12 deficiency    monitored/treated by Dr. Carlean Purl   Cervical dysplasia    Clotting disorder ([place])    on eliquis hx dvt/ pulmonary embolism   COVID 04/13/2021   Crohn disease ([place])    Crohn's disease of small intestine ([place]) 06/24/2012   Diagnosed 1999, in [place]. Ileitis only then. Treated with Imuran Remicade and prednisone.  Noncompliant with therapy. 2004 return to care and was treated with Remicade prednisone Pentasa Cipro and Flagyl. 2006 status post right hemicolectomy.  Subsequently treated with azathioprine and Cimzia. 200 mg every other week.  azathioprine was added in 2010. Prometheus TP MT enzyme was negative.   Depression    DVT of upper extremity (deep vein thrombosis) ([place]) 11/28/2017   Eczema    arms and behind knees, worse in winter   Generalized anxiety disorder    History of recurrent UTI (urinary tract infection)    HPV (human papilloma virus) infection    Hyperlipemia    Hypertriglyceridemia 07/03/2012   10/2010 labs showed level of 753 with total cholesterol 180 and HDL 41' Labs 05/2013--TG 180    Major depressive disorder in remission ([place]) 10/03/2013   MS (multiple sclerosis) ([place])    Multiple sclerosis ([place]) 2019   Officially diagnosed in the Fall   Neuromuscular disorder ([place])    Osteopenia 09/29/2011   T-1.6   Papanicolaou smear 10/2011   last abnormal 2011   Pulmonary embolism ([place]) 11/30/2017   Scaphoid fracture of wrist 2013   left   Seasonal allergic rhinitis    Shingles 09/2015   R hip   Vitamin B12 deficiency 06/19/2012   Vitamin D deficiency 12/16/2015   Vitamin D-OH level 12     Patient Active Problem List   Diagnosis Date Noted   Iron deficiency anemia 08/22/2020   Multiple falls 08/21/2019   Hematoma of right flank 08/21/2019   Anticoagulant long-term use 07/17/2019   Multiple sclerosis ([employer]) 12/19/2018   Pulmonary nodule  07/19/2018   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   Dvt femoral (deep venous thrombosis) ([place]) 11/30/2017   Arm DVT (deep venous thromboembolism), acute, right ([place]) 11/29/2017   Pulmonary embolus ([place]) 11/27/2017   Iron deficiency anemia due to chronic blood loss 10/05/2017   Heartburn 10/05/2017   Other fatigue 10/05/2017   Vitamin D deficiency 12/16/2015   Major depressive disorder in remission ([place]) 10/03/2013   Hypertriglyceridemia 07/03/2012   Crohn's disease of small intestine ([place]) 06/24/2012   Long-term use of immunosuppressant medication Entyvio + ocrelizumab 06/24/2012    Vitamin B12 deficiency 06/19/2012   Osteopenia of femur neck T. score -1.4 09/29/2011    Past Surgical History:  Procedure Laterality Date   APPENDECTOMY     CERVICAL BIOPSY  W/ LOOP ELECTRODE EXCISION  2009   ---paps normal since   COLONOSCOPY  multiple   scanned   ESOPHAGOGASTRODUODENOSCOPY  multiple   scanned   HEMICOLECTOMY  2006   IR ANGIOGRAM PULMONARY BILATERAL SELECTIVE  12/03/2017   IR ANGIOGRAM SELECTIVE EACH ADDITIONAL VESSEL  12/03/2017   IR ANGIOGRAM SELECTIVE EACH ADDITIONAL VESSEL  12/03/2017   IR INFUSION THROMBOL ARTERIAL INITIAL (MS)  12/03/2017   IR INFUSION THROMBOL ARTERIAL INITIAL (MS)  12/03/2017   IR IVC FILTER PLMT / S&I /IMG GUID/MOD SED  12/04/2017   IR THROMB F/U EVAL ART/VEN FINAL DAY (MS)  12/04/2017   IR US GUIDE VASC ACCESS RIGHT  12/03/2017   IVC FILTER REMOVAL N/A 07/11/2018   Procedure: IVC FILTER REMOVAL;  Surgeon: Serafina Mitchell, MD;  Location: [facility];  Service: Cardiovascular;  Laterality: N/A;   IVC VENOGRAPHY N/A 07/11/2018   Procedure: IVC Venography;  Surgeon: Serafina Mitchell, MD;  Location: [facility];  Service: Cardiovascular;  Laterality: N/A;   LEEP  2011   --done in [place] Right 11/30/2017   Procedure: PERIPHERAL VASCULAR BALLOON ANGIOPLASTY;  Surgeon: Serafina Mitchell, MD;  Location: [facility];  Service: Cardiovascular;  Laterality: Right;   PERIPHERAL VASCULAR THROMBECTOMY N/A 11/29/2017   Procedure: PERIPHERAL VASCULAR THROMBECTOMY - THROMBOLYSIS;  Surgeon: Serafina Mitchell, MD;  Location: [facility];  Service: Cardiovascular;  Laterality: N/A;  LYSIS CATHETER PLACEMENT ONLY   PERIPHERAL VASCULAR THROMBECTOMY N/A 11/30/2017   Procedure: PERIPHERAL VASCULAR THROMBECTOMY - Lysis Recheck;  Surgeon: Serafina Mitchell, MD;  Location: [facility];  Service: Cardiovascular;  Laterality: N/A;   WISDOM TOOTH EXTRACTION       OB History     Gravida  0   Para  0    Term  0   Preterm  0   AB  0   Living  0      SAB  0   IAB  0   Ectopic  0   Multiple  0   Live Births              Family History  Problem Relation Age of Onset   Hypertension Mother    Breast cancer Mother 56   Bone cancer Mother        metastatic from breast   Depression Mother    Hypothyroidism Mother    Colon polyps Father    Diabetes Father        borderline--resolved 2020   Hypertension Father        off meds now   Hyperlipidemia Father        off meds now   Fuch's  dystrophy Father    Colon cancer Paternal Grandfather    Multiple sclerosis Sister        affecting brain only   Alcohol abuse Sister    Fuch's dystrophy Sister    Stroke Maternal Grandmother    Lung cancer Maternal Grandmother    Hypertension Maternal Grandmother    Heart disease Paternal Grandmother    Hypertension Paternal Grandmother    Hypertension Sister    Heart disease Sister        poss MI in 67's    Social History   Tobacco Use   Smoking status: Former    Packs/day: 1.00    Years: 4.00    Pack years: 4.00    Types: Cigarettes    Quit date: 11/18/1998    Years since quitting: 22.7   Smokeless tobacco: Never  Vaping Use   Vaping Use: Never used  Substance Use Topics   Alcohol use: Not Currently   Drug use: Yes    Frequency: 4.0 times per week    Types: Marijuana    Comment: edibles, occasionally smokes, at bedtime    Home Medications Prior to Admission medications   Medication Sig Start Date End Date Taking? Authorizing Provider  acetaminophen (TYLENOL) 500 MG tablet Take 1,000 mg by mouth every 6 (six) hours as needed for headache (pain). Reported on 12/15/2015    [provider]  apixaban (ELIQUIS) 5 MG TABS tablet Take 1 tablet (5 mg total) by mouth 2 (two) times daily. 12/31/20   Rita Ohara, MD  baclofen (LIORESAL) 10 MG tablet Take 1 tablet (10 mg total) by mouth 3 (three) times daily as needed for muscle spasms. 04/22/21   Rita Ohara, MD  buPROPion  (WELLBUTRIN SR) 100 MG 12 hr tablet Take 1 tablet (100 mg total) by mouth 2 (two) times daily. 12/31/20   Rita Ohara, MD  cetirizine (ZYRTEC) 10 MG tablet Take 10 mg by mouth at bedtime.     [provider]  CHOLECALCIFEROL PO Take 5,000 Units by mouth 2 (two) times daily.    [provider]  colestipol (COLESTID) 5 g packet TAKE 5 GRAMS BY MOUTH DAILY 11/27/18   Gatha Mayer, MD  cyanocobalamin (,VITAMIN B-12,) 1000 MCG/ML injection Inject 1 mL (1,000 mcg total) into the skin every 14 (fourteen) days. 07/08/20   Brunetta Genera, MD  dalfampridine 10 MG TB12 TAKE 1 TABLET BY MOUTH  EVERY 12 HOURS 06/17/21   Pieter Partridge, DO  diphenoxylate-atropine (LOMOTIL) 2.5-0.025 MG tablet 1 to 2 tablets 4 times a day as needed for diarrhea 03/13/21   Gatha Mayer, MD  escitalopram (LEXAPRO) 10 MG tablet TAKE 1 TABLET BY MOUTH AT  BEDTIME 12/31/20   Rita Ohara, MD  fenofibrate 160 MG tablet TAKE 1 TABLET BY MOUTH AT BEDTIME 07/22/21   Rita Ohara, MD  ferrous sulfate 325 (65 FE) MG tablet Take 325 mg by mouth daily with breakfast.    [provider]  gabapentin (NEURONTIN) 100 MG capsule TAKE 3 CAPSULES(300 MG) BY MOUTH AT BEDTIME Patient taking differently: Take 100 mg by mouth at bedtime. 03/23/21   Pieter Partridge, DO  Insulin Syringe 27G X 1/2" 1 ML MISC 1 each by Does not apply route every 14 (fourteen) days. For [place] B12 injection 07/08/20   Brunetta Genera, MD  levonorgestrel [facility]) 20 MCG/24HR IUD 1 each by Intrauterine route once. Implanted April 2017 05/19/11   [provider]  NON FORMULARY Take 1 each by  mouth at bedtime. CBD/ cannabis    [provider]  Ofatumumab (KESIMPTA) 20 MG/0.4ML SOAJ Inject 20 mg into the skin every 28 (twenty-eight) days. 07/10/21   Tomi Likens, Adam R, DO  OVER THE COUNTER MEDICATION Apply 1 application topically daily as needed (arthritis pain). CBD Oil    [provider]  vedolizumab (ENTYVIO) 300 MG injection Inject 300 mg  into the vein every 28 (twenty-eight) days. 09/08/20   Gatha Mayer, MD    Allergies    Iron, Venofer [iron sucrose], Ibuprofen, Morphine and related, Prednisone, and Adhesive [tape]  Review of Systems   Review of Systems  Constitutional:  Negative for chills and fever.  HENT:  Negative for ear pain and sore throat.   Eyes:  Negative for pain and visual disturbance.  Respiratory:  Negative for cough and shortness of breath.   Cardiovascular:  Negative for chest pain and palpitations.  Gastrointestinal:  Positive for abdominal pain and nausea. Negative for vomiting.  Genitourinary:  Negative for dysuria and hematuria.  Musculoskeletal:  Negative for arthralgias and back pain.  Skin:  Negative for color change and rash.  Neurological:  Negative for dizziness, seizures, syncope and headaches.  All other systems reviewed and are negative.  Physical Exam Updated Vital Signs BP 135/71 (BP Location: Right Arm)   Pulse 97   Temp 98.3 F (36.8 C) (Oral)   Resp 18   Ht 5' 4"  (1.626 m)   Wt 99.8 kg   SpO2 97%   BMI 37.76 kg/m   Physical Exam Vitals and nursing note reviewed.  Constitutional:      General: She is not in acute distress.    Appearance: Normal appearance. She is not ill-appearing.  HENT:     Head: Normocephalic and atraumatic.  Eyes:     General: No scleral icterus.    Conjunctiva/sclera: Conjunctivae normal.  Cardiovascular:     Rate and Rhythm: Normal rate and regular rhythm.  Pulmonary:     Effort: Pulmonary effort is normal. No respiratory distress.  Abdominal:     General: Bowel sounds are normal. There is distension.     Tenderness: There is generalized abdominal tenderness.     Hernia: No hernia is present.  Skin:    General: Skin is warm and dry.     Findings: No rash.  Neurological:     Mental Status: She is alert.  Psychiatric:        Mood and Affect: Mood normal.        Behavior: Behavior normal.    ED Results / Procedures / Treatments    Labs (all labs ordered are listed, but only abnormal results are displayed) Labs Reviewed  COMPREHENSIVE METABOLIC PANEL - Abnormal; Notable for the following components:      Result Value   Calcium 8.7 (*)    AST 10 (*)    All other components within normal limits  CBC WITH DIFFERENTIAL/PLATELET - Abnormal; Notable for the following components:   WBC 12.4 (*)    Platelets 462 (*)    Neutro Abs 9.6 (*)    Monocytes Absolute 1.8 (*)    All other components within normal limits  LIPASE, BLOOD  URINALYSIS, ROUTINE W REFLEX MICROSCOPIC    EKG None  Radiology CT ABDOMEN PELVIS W CONTRAST  Result Date: 08/15/2021 CLINICAL DATA:  Abdominal pain, acute, nonlocalized EXAM: CT ABDOMEN AND PELVIS WITH CONTRAST TECHNIQUE: Multidetector CT imaging of the abdomen and pelvis was performed using the standard protocol following bolus administration  of intravenous contrast. CONTRAST:  159m OMNIPAQUE IOHEXOL 300 MG/ML  SOLN COMPARISON:  None. FINDINGS: Lower chest: Lung bases are clear. No effusions. Heart is normal size. Hepatobiliary: No focal hepatic abnormality. Gallbladder unremarkable. Pancreas: No focal abnormality or ductal dilatation. Spleen: No focal abnormality.  Normal size. Adrenals/Urinary Tract: Small cyst in the midpole of the left kidney is unchanged. 5.3 cm cyst in the lower pole of the right kidney is unchanged. No stones or hydronephrosis. Adrenal glands and urinary bladder unremarkable. Stomach/Bowel: Postoperative changes in the distal small bowel. There is wall thickening with surrounding mesenteric inflammation in the neo terminal ileum and distal ileum compatible with Crohn's flare. No bowel obstruction. Vascular/Lymphatic: No evidence of aneurysm or adenopathy. Reproductive: Uterus and adnexa unremarkable. No mass. IUD in place. Other: No free fluid or free air. Musculoskeletal: No acute bony abnormality. IMPRESSION: Postoperative changes in the distal small bowel. Inflammatory  changes and wall thickening involving the neo terminal ileum and distal ileum compatible with active Crohn's disease. Electronically Signed   By: KRolm BaptiseM.D.   On: 08/15/2021 15:10    Procedures Procedures   Medications Ordered in ED Medications  ketorolac (TORADOL) 30 MG/ML injection 30 mg (has no administration in time range)  morphine 4 MG/ML injection 4 mg (4 mg Intravenous Given 08/15/21 1346)  diphenhydrAMINE (BENADRYL) injection 25 mg (25 mg Intravenous Given 08/15/21 1345)  sodium chloride 0.9 % bolus 1,000 mL (1,000 mLs Intravenous New Bag/Given 08/15/21 1347)  iohexol (OMNIPAQUE) 300 MG/ML solution 100 mL (100 mLs Intravenous Contrast Given 08/15/21 1436)  morphine 4 MG/ML injection 4 mg (4 mg Intravenous Given 08/15/21 1531)    ED Course  I have reviewed the triage vital signs and the nursing notes.  Pertinent labs & imaging results that were available during my care of the patient were reviewed by me and considered in my medical decision making (see chart for details).    MDM Rules/Calculators/A&P  Patient presented with a complaint of severe abdominal pain that has been on and off for the past 3 weeks.  She was concerned for bowel obstruction.  She had bowel sounds in all 4 quadrants however was tender diffusely.  She did not have changes in her bowel movements or vomiting.  I pursued a normal abdominal pain work-up.  CMP and lipase were normal.  Slight leukocytosis to 12.4.  Patient is afebrile.  CT abdomen revealed inflammatory changes and wall thickening at the terminal ileum and distal ileum, consistent with a flareup of her Crohn's colitis.  I discussed these patients with the patient and she was understanding.  She already follows with Dr. GCarlean Purl GI, but reports she does not have an appointment with him until December.  I gave her the offer of starting a steroid burst and following up with him this week.  She agreed to this plan.  I was going to start the patient on  prednisone however she reported that it makes her hallucinate and be confused.  In the chart it said that she previously has tolerated methylprednisolone.  I calculated the methylprednisolone dosing and have started her on a 5-day taper.  We discussed the potential side effects of oral steroids.  Patient reported that her pain was coming back.  I gave her another dose of morphine.  RN told me that her pain was still not better after the dose of morphine, so I will give her Toradol shot on her way out.  Patient with a reported ibuprofen allergy however we discussed  that Toradol was similar to ibuprofen and she said that it was okay.  Similarly patient is with a morphine allergy however assured me that she can take morphine as long as she is given Benadryl at the same time.  This was done.  Patient and her husband with no further questions at this time.  Patient is ambulatory to and from the restroom and is stable for discharge home.  Final Clinical Impression(s) / ED Diagnoses Final diagnoses:  Crohn's disease of small intestine without complication ([place])    Rx / DC Orders Results and diagnoses were explained to the patient. Return precautions discussed in full. Patient had no additional questions and expressed complete understanding.     Rhae Hammock, PA-C 08/15/21 [place], Ankit, MD 08/16/21 [phone]

## 2021-08-15 NOTE — ED Triage Notes (Signed)
Reports abd pain x 4 days , nausea yet no emesis.denies melena .

## 2021-08-19 ENCOUNTER — Encounter: Payer: Self-pay | Admitting: Internal Medicine

## 2021-08-19 ENCOUNTER — Ambulatory Visit (INDEPENDENT_AMBULATORY_CARE_PROVIDER_SITE_OTHER): Payer: 59 | Admitting: Internal Medicine

## 2021-08-19 VITALS — BP 126/64 | HR 75 | Ht 64.0 in | Wt 225.0 lb

## 2021-08-19 DIAGNOSIS — Z7960 Long term (current) use of unspecified immunomodulators and immunosuppressants: Secondary | ICD-10-CM | POA: Diagnosis not present

## 2021-08-19 DIAGNOSIS — G35 Multiple sclerosis: Secondary | ICD-10-CM

## 2021-08-19 DIAGNOSIS — T50905A Adverse effect of unspecified drugs, medicaments and biological substances, initial encounter: Secondary | ICD-10-CM

## 2021-08-19 DIAGNOSIS — K50012 Crohn's disease of small intestine with intestinal obstruction: Secondary | ICD-10-CM | POA: Diagnosis not present

## 2021-08-19 NOTE — Progress Notes (Signed)
Lindsay Mccarthy 45 y.o. [birth year] [id number]  Assessment & Plan:   Encounter Diagnoses  Name Primary?   Crohn's disease of small intestine with intestinal obstruction ([place]) Yes   Long-term use of immunosuppressant medication    Multiple sclerosis ([place])    Medication side effect, initial encounter    It sounds like she is having side effects from [place].  I think there are 3 possibilities here 1 would be side effects from the medication alone, the other would be that it is flaring her Crohn's, and the other would be that this is temporarily but not causally associated.  I think that it is most likely that the Kesimpta causes the symptoms.  I am not convinced her Crohn's disease is flaring.  But it sounds like she is having some sort of idiosyncratic reaction or side effect from the medication.  We talked about this and though the control of her Crohn's disease is not ideal on Entyvio with respect to imaging and endoscopic evaluation she has had, she is comfortable with that as long as she feels okay and she had been doing well on monthly Entyvio.   I would recommend not taking Kesimpta any further.  That is her intent and she will sort that out with Dr. Tomi Likens and consider what the next options for her MS are.  She has an appointment to see me in December still and will keep that.  Further plans pending clinical course.  She is open to the idea of potential resection of her Crohn's disease if necessary though we would like to avoid that if possible.   I appreciate the opportunity to care for this patient. CC: Rita Ohara, MD Dr. Metta Clines   Subjective:   Chief Complaint: Crohn's disease, flare question  HPI The patient was in the emergency department recently with severe abdominal pain see HPI as below.  She has Crohn's ileitis and has inflammatory changes and stricture in that area based upon recent colonoscopy in April.  She has been treated with Remicade in the past and  Cimzia and was transitioned to [facility] within the past couple of years.  We now have her on monthly Entyvio.  She was doing well on that, she had adequate trough level of 13 after her colonoscopy earlier this year, but since starting a new MS medication had abdominal pain and diarrhea.  Kesimpta was started for her MS and the first dose of a 3 dose load caused diarrhea the second time she had abdominal pain.  She scheduled an appointment to see me and that was for December and then after the third dose she had terrible intense abdominal pain and went to the emergency department as below.  They started her on a short course of prednisone.  CT scan demonstrated inflammatory changes in the terminal ileum which is really sort of chronic.   HPI from ED visit 08/15/2021 Lindsay Mccarthy is a 45 y.o. female with a past medical disease of MS and Crohn's colitis presenting today with a complaint of abdominal pain and distention.  Patient reports that 3 weeks ago she felt similarly however it resolved.  Now since Wednesday she has had excruciating abdominal pain that she describes as sharp.  3 weeks ago the pain was localized to her right side however now it is generalized.  Associated with nausea on Wednesday night.  No nausea or vomiting since.  Patient still having bowel movement, last bowel movement this morning.  No blood noted.  Denies fevers, saying that her highest temperature was 99.  No chills.  Patient does not get menstrual periods.  History of both small and large bowel resection.  Recently started new medication, Kesimpta, for her progressive MS.  Lab Results  Component Value Date   WBC 12.4 (H) 08/15/2021   HGB 12.5 08/15/2021   HCT 39.1 08/15/2021   MCV 87.3 08/15/2021   PLT 462 (H) 08/15/2021     CT abdomen pelvis 08/15/2021 IMPRESSION: Postoperative changes in the distal small bowel. Inflammatory changes and wall thickening involving the neo terminal ileum and distal ileum compatible  with active Crohn's disease.  CT abdomen pelvis with contrast 08/01/2020 IMPRESSION: Status post ileocecal resection. Mild wall thickening involving the neoterminal ileum, equivocal, although possibly reflecting mild active inflammatory Crohn's disease.   No evidence of bowel obstruction or stricture. No associated fistula or abscess.  Allergies  Allergen Reactions   Iron Other (See Comments)    Blood in stool/Rectal Bleeding   Venofer [Iron Sucrose] Other (See Comments)    Fever, infusion reaction    Ibuprofen Other (See Comments)    Avoids due to Crohns disease   Morphine And Related Itching    itchy   Prednisone Other (See Comments)    "crazy", hallucinations Tolerates methylprednisolone   Adhesive [Tape] Rash    Rash with electrodes   Current Meds  Medication Sig   acetaminophen (TYLENOL) 500 MG tablet Take 1,000 mg by mouth every 6 (six) hours as needed for headache (pain). Reported on 12/15/2015   apixaban (ELIQUIS) 5 MG TABS tablet Take 1 tablet (5 mg total) by mouth 2 (two) times daily.   baclofen (LIORESAL) 10 MG tablet Take 1 tablet (10 mg total) by mouth 3 (three) times daily as needed for muscle spasms.   buPROPion (WELLBUTRIN SR) 100 MG 12 hr tablet Take 1 tablet (100 mg total) by mouth 2 (two) times daily.   cetirizine (ZYRTEC) 10 MG tablet Take 10 mg by mouth at bedtime.    CHOLECALCIFEROL PO Take 5,000 Units by mouth 2 (two) times daily.   colestipol (COLESTID) 5 g packet TAKE 5 GRAMS BY MOUTH DAILY   cyanocobalamin (,VITAMIN B-12,) 1000 MCG/ML injection Inject 1 mL (1,000 mcg total) into the skin every 14 (fourteen) days.   dalfampridine 10 MG TB12 TAKE 1 TABLET BY MOUTH  EVERY 12 HOURS   diphenoxylate-atropine (LOMOTIL) 2.5-0.025 MG tablet 1 to 2 tablets 4 times a day as needed for diarrhea   escitalopram (LEXAPRO) 10 MG tablet TAKE 1 TABLET BY MOUTH AT  BEDTIME   fenofibrate 160 MG tablet TAKE 1 TABLET BY MOUTH AT BEDTIME   ferrous sulfate 325 (65 FE) MG  tablet Take 325 mg by mouth daily with breakfast.   gabapentin (NEURONTIN) 100 MG capsule TAKE 3 CAPSULES(300 MG) BY MOUTH AT BEDTIME (Patient taking differently: Take 100 mg by mouth at bedtime.)   Insulin Syringe 27G X 1/2" 1 ML MISC 1 each by Does not apply route every 14 (fourteen) days. For  B12 injection   levonorgestrel (MIRENA) 20 MCG/24HR IUD 1 each by Intrauterine route once. Implanted April 2017   methylPREDNISolone (MEDROL DOSEPAK) 4 MG TBPK tablet Take 6 tablets on the first day and then 5 tablets for the next 4 days.   NON FORMULARY Take 1 each by mouth at bedtime. CBD/ cannabis   Ofatumumab (KESIMPTA) 20 MG/0.4ML SOAJ Inject 20 mg into the skin every 28 (twenty-eight) days.   OVER THE COUNTER MEDICATION Apply 1 application topically  daily as needed (arthritis pain). CBD Oil   vedolizumab (ENTYVIO) 300 MG injection Inject 300 mg into the vein every 28 (twenty-eight) days.   Past Medical History:  Diagnosis Date   Anemia    related to Crohns flares   Arm DVT (deep venous thromboembolism), acute, right ([place]) 11/29/2017   Arthritis    knees, feet, hands, wrists, related to Crohns flare   Asthma    Childhood   B12 deficiency    monitored/treated by Dr. Carlean Purl   Cervical dysplasia    Clotting disorder ([place])    on eliquis hx dvt/ pulmonary embolism   COVID 04/13/2021   Crohn disease ([place])    Crohn's disease of small intestine ([place]) 06/24/2012   Diagnosed 1999, in [place]. Ileitis only then. Treated with Imuran Remicade and prednisone.  Noncompliant with therapy. 2004 return to care and was treated with Remicade prednisone Pentasa Cipro and Flagyl. 2006 status post right hemicolectomy. Subsequently treated with azathioprine and Cimzia. 200 mg every other week.  azathioprine was added in 2010. Prometheus TP MT enzyme was negative.   Depression    DVT of upper extremity (deep vein thrombosis) ([place]) 11/28/2017   Eczema    arms and behind knees, worse in winter   Generalized  anxiety disorder    History of recurrent UTI (urinary tract infection)    HPV (human papilloma virus) infection    Hyperlipemia    Hypertriglyceridemia 07/03/2012   10/2010 labs showed level of 753 with total cholesterol 180 and HDL 41' Labs 05/2013--TG 180    Major depressive disorder in remission ([place]) 10/03/2013   MS (multiple sclerosis) ([place])    Multiple sclerosis ([place]) 2019   Officially diagnosed in the Fall   Neuromuscular disorder ([place])    Osteopenia 09/29/2011   T-1.6   Papanicolaou smear 10/2011   last abnormal 2011   Pulmonary embolism ([place]) 11/30/2017   Scaphoid fracture of wrist 2013   left   Seasonal allergic rhinitis    Shingles 09/2015   R hip   Vitamin B12 deficiency 06/19/2012   Vitamin D deficiency 12/16/2015   Vitamin D-OH level 12    Past Surgical History:  Procedure Laterality Date   APPENDECTOMY     CERVICAL BIOPSY  W/ LOOP ELECTRODE EXCISION  2009   ---paps normal since   COLONOSCOPY  multiple   scanned   ESOPHAGOGASTRODUODENOSCOPY  multiple   scanned   HEMICOLECTOMY  2006   IR ANGIOGRAM PULMONARY BILATERAL SELECTIVE  12/03/2017   IR ANGIOGRAM SELECTIVE EACH ADDITIONAL VESSEL  12/03/2017   IR ANGIOGRAM SELECTIVE EACH ADDITIONAL VESSEL  12/03/2017   IR INFUSION THROMBOL ARTERIAL INITIAL (MS)  12/03/2017   IR INFUSION THROMBOL ARTERIAL INITIAL (MS)  12/03/2017   IR IVC FILTER PLMT / S&I /IMG GUID/MOD SED  12/04/2017   IR THROMB F/U EVAL ART/VEN FINAL DAY ([place])  12/04/2017   IR US GUIDE VASC ACCESS RIGHT  12/03/2017   IVC FILTER REMOVAL N/A 07/11/2018   Procedure: IVC FILTER REMOVAL;  Surgeon: Serafina Mitchell, MD;  Location: [facility];  Service: Cardiovascular;  Laterality: N/A;   IVC VENOGRAPHY N/A 07/11/2018   Procedure: IVC Venography;  Surgeon: Serafina Mitchell, MD;  Location: [facility];  Service: Cardiovascular;  Laterality: N/A;   LEEP  2011   --done in [place] Right 11/30/2017   Procedure:  PERIPHERAL VASCULAR BALLOON ANGIOPLASTY;  Surgeon: Serafina Mitchell, MD;  Location: [facility];  Service:  Cardiovascular;  Laterality: Right;   PERIPHERAL VASCULAR THROMBECTOMY N/A 11/29/2017   Procedure: PERIPHERAL VASCULAR THROMBECTOMY - THROMBOLYSIS;  Surgeon: Serafina Mitchell, MD;  Location: [facility];  Service: Cardiovascular;  Laterality: N/A;  LYSIS CATHETER PLACEMENT ONLY   PERIPHERAL VASCULAR THROMBECTOMY N/A 11/30/2017   Procedure: PERIPHERAL VASCULAR THROMBECTOMY - Lysis Recheck;  Surgeon: Serafina Mitchell, MD;  Location: [facility];  Service: Cardiovascular;  Laterality: N/A;   WISDOM TOOTH EXTRACTION     Social History   Social History Narrative   The patient is divorced.     Re-married Marcello Moores, partner of 10 years, on 10/10/16. 2 dogs, 1 cat   No children - doesn't want any   Curator for [employer].   Moved from [place] to [place] in 2013.   Past smoker   No alcohol   2-3 caffeinated beverages a day   She reports she is compliant with sunscreen given her increased risk of sun damage and skin cancer (no longer on azathioprine)      Updated 12/31/20   family history includes Alcohol abuse in her sister; Bone cancer in her mother; Breast cancer (age of onset: 69) in her mother; Colon cancer in her paternal grandfather; Colon polyps in her father; Depression in her mother; Diabetes in her father; Fuch's dystrophy in her father and sister; Heart disease in her paternal grandmother and sister; Hyperlipidemia in her father; Hypertension in her father, maternal grandmother, mother, paternal grandmother, and sister; Hypothyroidism in her mother; Lung cancer in her maternal grandmother; Multiple sclerosis in her sister; Stroke in her maternal grandmother.   Review of Systems As per HPI  Objective:   Physical Exam BP 126/64   Pulse 75   Ht 5' 4"  (1.626 m)   Wt 225 lb (102.1 kg)   BMI 38.62 kg/m  Middle-aged white woman  well-developed well-nourished obese no acute distress Abdomen is obese soft she is tender in the periumbilical right lower quadrant and left upper quadrant area is mildly so no rebound no worrisome features.  Surgical scars persist.  31 minutes time spent on this visit

## 2021-08-19 NOTE — Patient Instructions (Signed)
If you are age 43 or older, your body mass index should be between 23-30. Your Body mass index is 38.62 kg/m. If this is out of the aforementioned range listed, please consider follow up with your Primary Care Provider.  If you are age 65 or younger, your body mass index should be between 19-25. Your Body mass index is 38.62 kg/m. If this is out of the aformentioned range listed, please consider follow up with your Primary Care Provider.   ________________________________________________________  The [facility] providers would like to encourage you to use [facility] to communicate with providers for non-urgent requests or questions.  Due to long hold times on the telephone, sending your provider a message by [facility] may be a faster and more efficient way to get a response.  Please allow 48 business hours for a response.  Please remember that this is for non-urgent requests.  _______________________________________________________  Please keep your December appointment with [place].   I appreciate the opportunity to care for you. Silvano Rusk, MD, [facility]

## 2021-08-29 ENCOUNTER — Other Ambulatory Visit: Payer: Self-pay | Admitting: Hematology

## 2021-08-29 ENCOUNTER — Other Ambulatory Visit: Payer: Self-pay | Admitting: Neurology

## 2021-08-29 DIAGNOSIS — G35 Multiple sclerosis: Secondary | ICD-10-CM

## 2021-08-29 DIAGNOSIS — M62838 Other muscle spasm: Secondary | ICD-10-CM

## 2021-08-31 ENCOUNTER — Encounter: Payer: Self-pay | Admitting: Hematology

## 2021-08-31 NOTE — Telephone Encounter (Signed)
Dr. Carlean Purl Pt Please see note from Pt. Pt was contacted and stated that she believes that she is having a flare. States that's she has Abdominal pain and severe cramping for 4 days. Please advise As DOD

## 2021-09-02 ENCOUNTER — Telehealth: Payer: Self-pay | Admitting: Internal Medicine

## 2021-09-02 NOTE — Telephone Encounter (Signed)
I suppose that is a possibility.  I am glad she is feeling better.  Continuing the align for a month is very reasonable.  As far as the gastritis question I think the answer is maybe but I really do not know.  She could have had some sort of partial obstruction develop and release that would be more likely given her Crohn's disease.  I think continuing what she is doing taking the align for a month makes sense and she should contact us again if things start heading in the wrong direction.  If she is feeling really terrible even though the phone calls can take a while it would be best if she call us for more prompt attention.

## 2021-09-02 NOTE — Telephone Encounter (Signed)
Pt made aware of Dr. Carlean Purl recommendations  Pt verbalized understanding with all questions answered.

## 2021-09-02 NOTE — Telephone Encounter (Signed)
Received information that there was a pending MyChart message which I was able to review it has been sent to Dr. Lyndel Safe when I was off on Monday  Please call her and get an update on symptoms if she is still miserable I think the only thing we have to offer is an ER visit for an acute evaluation.  I cannot admit her to the hospital without that type of evaluation unfortunately  I can call her back later today but give her a call this morning and see how she is doing

## 2021-09-02 NOTE — Telephone Encounter (Signed)
Pt stated that she started Align Probiotic on Monday Night. States that she has 2 doses already.  Pt states that she has been on a liquid/ Soft diet.  Pt states that she is feeling better; The pain is better,  and able to stand up. Pt states that she is feeling really gassy. Pt questioned if she has gastritis.  Please advise

## 2021-09-03 NOTE — Telephone Encounter (Signed)
I had to cover the hospital Just saw the message Remo Lipps, can you find out how she is doing? May need to work her into APP clinic if still with problems RG

## 2021-09-03 NOTE — Telephone Encounter (Signed)
Pt states that she is feeling better and better every day. Pt states that she started taking Align for a probiotic several days ago.

## 2021-09-29 ENCOUNTER — Other Ambulatory Visit: Payer: Self-pay | Admitting: Neurology

## 2021-10-07 ENCOUNTER — Encounter: Payer: Self-pay | Admitting: Internal Medicine

## 2021-10-07 ENCOUNTER — Ambulatory Visit (INDEPENDENT_AMBULATORY_CARE_PROVIDER_SITE_OTHER): Payer: 59 | Admitting: Internal Medicine

## 2021-10-07 VITALS — BP 118/78 | HR 105 | Ht 64.0 in | Wt 220.0 lb

## 2021-10-07 DIAGNOSIS — Z7960 Long term (current) use of unspecified immunomodulators and immunosuppressants: Secondary | ICD-10-CM | POA: Diagnosis not present

## 2021-10-07 DIAGNOSIS — G35 Multiple sclerosis: Secondary | ICD-10-CM

## 2021-10-07 DIAGNOSIS — K50012 Crohn's disease of small intestine with intestinal obstruction: Secondary | ICD-10-CM | POA: Diagnosis not present

## 2021-10-07 NOTE — Progress Notes (Signed)
Lindsay Mccarthy 45 y.o. 1976/08/23 [id number]  Assessment & Plan:   Encounter Diagnoses  Name Primary?   Crohn's disease of small intestine with intestinal obstruction ([place]) Yes   Long-term use of immunosuppressant medication    Multiple sclerosis ([place])     Complicated situation.  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  How much of this is inflammatory versus cicatrix is not clear.  She is symptomatic.  She has been treated over the years with steroids initially in 1999, also Imuran and Remicade.  She was admittedly noncompliant for a while and then return to care and treated with Remicade and Pentasa in 2004.  Right hemicolectomy was in 2006 and then was on azathioprine and Cimzia for years.  She did well for a long time.  Because of an inflammatory ileocolonic stricture in 2018 I changed her to [facility] and azathioprine was discontinued.  She has begun having more symptoms and problems in the last 6 to 12 months.  She has an adequate level of Entyvio.  She is on monthly therapy.  It seems like she is failing therapy.  We discussed change in medical therapy versus surgical options.  She asks appropriately about stricturoplasty versus resection.  I think a tertiary referral is sensible both for medical treatment of inflammatory bowel disease and possible surgical options.  We will refer to [facility] to see Dr. Drue Flirt of surgery and Dr. Nyoka Cowden or [facility] of gastroenterology.  Multiple sclerosis in the background is a complicating factor on several fronts including surgical risk, I think.  Mainly in recovery from surgery  I would think.  At any rate we are going to get some help as above and see what the neck steps might be.  She will continue to manage things conservatively and contact me as needed in the short-term and I will continue  to be her local gastroenterologist.  CC: Rita Ohara, MD Dr. Epifanio Lesches  Subjective:   Chief Complaint: Follow-up of Crohn's disease a recent flare question partial SBO  HPI Lindsay Mccarthy presents for follow-up of her Crohn's ileocolitis with active inflammation and ileocolonic stricture as seen on colonoscopy in April of this year.  She was also in the emergency department in mid October and had inflammatory changes in the neoterminal ileum on a CT scan.  I saw her not long after that and she was thinking she was having an increase in symptoms because of Kesimpta which was treatment for MS and she does think she is better off that medication.  She had an episode of abdominal pain in the periumbilical and right lower quadrant went to bed for a day Sunday to Monday this week, when she vomited she felt better but she is still sore and now says stools appear greasy.  She is improving however.  She thinks that was a partial SBO versus food poisoning.  Her husband ate but did not eat the same thing but he was not sick.  Several weeks ago she thinks she had a similar episode that lasted about 5 days.  She is asking about the possibility of Skyrizi treatment as an option and also asking about surgical options including stricturoplasty versus resection.  Wt Readings from Last 3 Encounters:  10/07/21 220 lb (99.8 kg)  08/19/21 225 lb (102.1 kg)  08/15/21 220 lb (99.8  kg)    Allergies  Allergen Reactions   Iron Other (See Comments)    Blood in stool/Rectal Bleeding   Venofer [Iron Sucrose] Other (See Comments)    Fever, infusion reaction    Ibuprofen Other (See Comments)    Avoids due to Crohns disease   Morphine And Related Itching    itchy   Prednisone Other (See Comments)    "crazy", hallucinations Tolerates methylprednisolone   Adhesive [Tape] Rash    Rash with electrodes   Current Meds  Medication Sig   acetaminophen (TYLENOL) 500 MG tablet Take 1,000 mg by mouth every 6 (six) hours as  needed for headache (pain). Reported on 12/15/2015   apixaban (ELIQUIS) 5 MG TABS tablet Take 1 tablet (5 mg total) by mouth 2 (two) times daily.   baclofen (LIORESAL) 10 MG tablet TAKE 1 TABLET(10 MG) BY MOUTH THREE TIMES DAILY AS NEEDED FOR MUSCLE SPASMS   buPROPion (WELLBUTRIN SR) 100 MG 12 hr tablet Take 1 tablet (100 mg total) by mouth 2 (two) times daily.   cetirizine (ZYRTEC) 10 MG tablet Take 10 mg by mouth at bedtime.    CHOLECALCIFEROL PO Take 5,000 Units by mouth 2 (two) times daily.   dalfampridine 10 MG TB12 TAKE 1 TABLET BY MOUTH  EVERY 12 HOURS   diphenoxylate-atropine (LOMOTIL) 2.5-0.025 MG tablet 1 to 2 tablets 4 times a day as needed for diarrhea   DODEX 1000 MCG/ML injection INJECT 1 ML INTO THE SKIN EVERY 14 DAYS   escitalopram (LEXAPRO) 10 MG tablet TAKE 1 TABLET BY MOUTH AT  BEDTIME   fenofibrate 160 MG tablet TAKE 1 TABLET BY MOUTH AT BEDTIME   gabapentin (NEURONTIN) 100 MG capsule TAKE 3 CAPSULES(300 MG) BY MOUTH AT BEDTIME   Insulin Syringe 27G X 1/2" 1 ML MISC 1 each by Does not apply route every 14 (fourteen) days. For [place] B12 injection   levonorgestrel (MIRENA) 20 MCG/24HR IUD 1 each by Intrauterine route once. Implanted April 2017   NON FORMULARY Take 1 each by mouth at bedtime. CBD/ cannabis   vedolizumab (ENTYVIO) 300 MG injection Inject 300 mg into the vein every 28 (twenty-eight) days.   Past Medical History:  Diagnosis Date   Anemia    related to Crohns flares   Arm DVT (deep venous thromboembolism), acute, right ([place]) 11/29/2017   Arthritis    knees, feet, hands, wrists, related to Crohns flare   Asthma    Childhood   B12 deficiency    monitored/treated by Dr. Carlean Purl   Cervical dysplasia    Clotting disorder ([place])    on eliquis hx dvt/ pulmonary embolism   COVID 04/13/2021   Crohn disease ([place])    Crohn's disease of small intestine ([place]) 06/24/2012   Diagnosed 1999, in [place]. Ileitis only then. Treated with Imuran Remicade and prednisone.   Noncompliant with therapy. 2004 return to care and was treated with Remicade prednisone Pentasa Cipro and Flagyl. 2006 status post right hemicolectomy. Subsequently treated with azathioprine and Cimzia. 200 mg every other week.  azathioprine was added in 2010. Prometheus TP MT enzyme was negative.   Depression    DVT of upper extremity (deep vein thrombosis) ([place]) 11/28/2017   Eczema    arms and behind knees, worse in winter   Generalized anxiety disorder    History of recurrent UTI (urinary tract infection)    HPV (human papilloma virus) infection    Hyperlipemia    Hypertriglyceridemia 07/03/2012   10/2010 labs showed level of 753 with  total cholesterol 180 and HDL 41' Labs 05/2013--TG 180    Major depressive disorder in remission ([place]) 10/03/2013   MS (multiple sclerosis) ([place])    Multiple sclerosis ([place]) 2019   Officially diagnosed in the Fall   Neuromuscular disorder ([place])    Osteopenia 09/29/2011   T-1.6   Papanicolaou smear 10/2011   last abnormal 2011   Pulmonary embolism ([place]) 11/30/2017   Scaphoid fracture of wrist 2013   left   Seasonal allergic rhinitis    Shingles 09/2015   R hip   Vitamin B12 deficiency 06/19/2012   Vitamin D deficiency 12/16/2015   Vitamin D-OH level 12    Past Surgical History:  Procedure Laterality Date   APPENDECTOMY     CERVICAL BIOPSY  W/ LOOP ELECTRODE EXCISION  2009   ---paps normal since   COLONOSCOPY  multiple   scanned   ESOPHAGOGASTRODUODENOSCOPY  multiple   scanned   HEMICOLECTOMY  2006   IR ANGIOGRAM PULMONARY BILATERAL SELECTIVE  12/03/2017   IR ANGIOGRAM SELECTIVE EACH ADDITIONAL VESSEL  12/03/2017   IR ANGIOGRAM SELECTIVE EACH ADDITIONAL VESSEL  12/03/2017   IR INFUSION THROMBOL ARTERIAL INITIAL (MS)  12/03/2017   IR INFUSION THROMBOL ARTERIAL INITIAL (MS)  12/03/2017   IR IVC FILTER PLMT / S&I /IMG GUID/MOD SED  12/04/2017   IR THROMB F/U EVAL ART/VEN FINAL DAY ([place])  12/04/2017   IR US GUIDE VASC ACCESS RIGHT  12/03/2017   IVC FILTER  REMOVAL N/A 07/11/2018   Procedure: IVC FILTER REMOVAL;  Surgeon: Serafina Mitchell, MD;  Location: [facility];  Service: Cardiovascular;  Laterality: N/A;   IVC VENOGRAPHY N/A 07/11/2018   Procedure: IVC Venography;  Surgeon: Serafina Mitchell, MD;  Location: [facility] [place];  Service: Cardiovascular;  Laterality: N/A;   LEEP  2011   --done in [place] Right 11/30/2017   Procedure: PERIPHERAL VASCULAR BALLOON ANGIOPLASTY;  Surgeon: Serafina Mitchell, MD;  Location: [facility];  Service: Cardiovascular;  Laterality: Right;   PERIPHERAL VASCULAR THROMBECTOMY N/A 11/29/2017   Procedure: PERIPHERAL VASCULAR THROMBECTOMY - THROMBOLYSIS;  Surgeon: Serafina Mitchell, MD;  Location: [facility];  Service: Cardiovascular;  Laterality: N/A;  LYSIS CATHETER PLACEMENT ONLY   PERIPHERAL VASCULAR THROMBECTOMY N/A 11/30/2017   Procedure: PERIPHERAL VASCULAR THROMBECTOMY - Lysis Recheck;  Surgeon: Serafina Mitchell, MD;  Location: [facility];  Service: Cardiovascular;  Laterality: N/A;   WISDOM TOOTH EXTRACTION     Social History   Social History Narrative   The patient is divorced.     Re-married Marcello Moores, partner of 10 years, on 10/10/16. 2 dogs, 1 cat   No children - doesn't want any   Curator for [employer].   Moved from [place] to [place] in 2013.   Past smoker   No alcohol   2-3 caffeinated beverages a day   She reports she is compliant with sunscreen given her increased risk of sun damage and skin cancer (no longer on azathioprine)      Updated 12/31/20   family history includes Alcohol abuse in her sister; Bone cancer in her mother; Breast cancer (age of onset: 39) in her mother; Colon cancer in her paternal grandfather; Colon polyps in her father; Depression in her mother; Diabetes in her father; Fuch's dystrophy in her father and sister; Heart disease in her paternal grandmother and sister;  Hyperlipidemia in her father; Hypertension in her father, maternal grandmother, mother,  paternal grandmother, and sister; Hypothyroidism in her mother; Lung cancer in her maternal grandmother; Multiple sclerosis in her sister; Stroke in her maternal grandmother.   Review of Systems As per HPI  Objective:   Physical Exam BP 118/78   Pulse (!) 105   Ht 5' 4"  (1.626 m)   Wt 220 lb (99.8 kg)   SpO2 98%   BMI 37.76 kg/m  Obese NAD Ambulates slowly - difficult to rise from chair  Lungs cta Cor NL S1s2 no rmg Abd obese and tender RLQ > just above umbilicus (my 2 areas of pain) Alert and oriented x 3

## 2021-10-07 NOTE — Patient Instructions (Addendum)
We are going to work on several referrals for you to be seen at [facility] by a Armed forces operational officer for IBD.  If you are age 43 or older, your body mass index should be between 23-30. Your Body mass index is 37.76 kg/m. If this is out of the aforementioned range listed, please consider follow up with your Primary Care Provider.  If you are age 40 or younger, your body mass index should be between 19-25. Your Body mass index is 37.76 kg/m. If this is out of the aformentioned range listed, please consider follow up with your Primary Care Provider.   ________________________________________________________  The [facility] providers would like to encourage you to use [facility] to communicate with providers for non-urgent requests or questions.  Due to long hold times on the telephone, sending your provider a message by [facility] may be a faster and more efficient way to get a response.  Please allow 48 business hours for a response.  Please remember that this is for non-urgent requests.  _______________________________________________________   I appreciate the opportunity to care for you. Silvano Rusk, MD, [facility]

## 2021-11-12 DIAGNOSIS — K50112 Crohn's disease of large intestine with intestinal obstruction: Secondary | ICD-10-CM | POA: Insufficient documentation

## 2021-11-12 DIAGNOSIS — Z86718 Personal history of other venous thrombosis and embolism: Secondary | ICD-10-CM | POA: Insufficient documentation

## 2021-11-23 ENCOUNTER — Encounter: Payer: Self-pay | Admitting: Internal Medicine

## 2021-11-26 ENCOUNTER — Other Ambulatory Visit: Payer: 59

## 2021-11-26 ENCOUNTER — Ambulatory Visit: Payer: 59 | Admitting: Hematology

## 2021-11-28 ENCOUNTER — Other Ambulatory Visit: Payer: Self-pay | Admitting: Family Medicine

## 2021-11-28 DIAGNOSIS — F325 Major depressive disorder, single episode, in full remission: Secondary | ICD-10-CM

## 2021-12-06 ENCOUNTER — Encounter: Payer: Self-pay | Admitting: Hematology

## 2021-12-20 ENCOUNTER — Other Ambulatory Visit: Payer: Self-pay | Admitting: Family Medicine

## 2021-12-20 DIAGNOSIS — E781 Pure hyperglyceridemia: Secondary | ICD-10-CM

## 2021-12-21 NOTE — Progress Notes (Signed)
NEUROLOGY FOLLOW UP OFFICE NOTE  [name] [id number]  Assessment/Plan:   Multiple sclerosis - due to recent surgery and changes in medication to treat her Crohn's, will defer starting a new DMT for now.  She will be on immunosuppressant therapy for Crohn's anyway.    DMT: Defer MRI of brain/cervical/thoracic spine with and without contrast in 6 months to establish new baseline D3 5000 IU BID, gabapentin 100-234m QHS, Ampyra, baclofen 17mTID PRN Follow up 6 months after repeat imaging and will discuss restarting DMT at that time.   Subjective:  Lindsay Mccarthy a [age]ear old right-handed woman with Crohn's disease, depression, and anxiety who follows up for multiple sclerosis.  She is accompanied by her husband.    UPDATE: Current disease modifying therapy: None Other medication:  D3 5000 IU BID, B12 injections, Ampyra, gabapentin 100 to 20039mt bedtime, baclofen at bedtime, Wellbutrin, Lexapro 69m48mColestid, Bentayl, Eliquis, Mirena  MRIs personally reviewed: 07/07/2021 MRI BRAIN W WO:  Similar mild T2 white matter hyperintensities, compatible with the reported history of multiple sclerosis. No convincing new or enhancing lesions identified. 07/19/2021 MRI C-SPINE W WO:  1. Multiple T2 hyperintense cord lesions consistent with the clinical diagnosis of multiple sclerosis. No new or enhancing lesion identified.  2. Degenerative changes at C4-5 with mild progression of spinal canal stenosis which is moderate, with mass effect on the cord.  There is also mild-to-moderate right and moderate left neural foraminal narrowing at this level.  Following the initiation of Kesimpta, shhe had a Crohn's flare and believed it to be due to the DMT, so she discontinued it.  She needed an ileocecectomy earlier this month, now with a bag.  Off vit D for 10 days prior to surgery.     Labs: 12/11/2021:  CMP with Na 140, K 4, Cl 103, CO2 25, glucose 65, BUN 9, Cr 0.78, Ca 9, t bili 0.3,  ALP 56, AST 16, ALT 19 11/12/2021:  Hep B surface antigen non-reactive; CBC with WBC 13.8, HGB 12.1, HCT 36.6, PLT 570, ALC 0.8 06/15/2021:  IgA 144, IgG 1,037, IgM 32     Overall, she is feeling well.  Symptoms are stable.  Notes some exacerbation of weakness with heat.  She notes that she doesn't feel well for the 6 weeks prior to next infusion.  She would like to consider switching to Kesimpta.   Vision:  No issues Motor:  No new issues Sensory:  numbness and tingling in legs  Pain:  Spasms in legs as well as abdomen. Gait:  Gait improved since restarting Ampyra.  Now usually ambulates without cane unless outside. Bowel/Bladder:  Occasional urinary incontinence when stressed.  Crohn's disease. Fatigue:  Some fatigue. Cognition:  Trouble multi-tasking.  She underwent neuropsychological testing on 09/04/2019.  Performance fell largely within normal limits.  She did exhibit a weakness across aspects of attention/concentration and some performance variability across processing speed.  Performance involving complex attention, executive functioning, receptive and expressive language, visual-spatial abilities and verbal and visual learning and memory were within normal range. Mood:  Stable   HISTORY: She reports history of various neurologic symptoms since her late 20s:66s Urinary incontinence:  It started about a year ago and has gotten worse.  It occurs suddenly, once or twice a month, usually at home and while standing, such as while washing dishes with the water running.  It occurs during fatigued or emotional stress as well.  She denies increased urinary frequency or dysuria.  No  perineal numbness.  No low back pain.   - She reports poor night vision with difficulty seeing while driving at night. She has trouble focusing.  Recent eye exam showed poor "refocusing", which would normally be seen in an older adult.  Her sister had Fuchs dystrophy but she doesn't have it. -  She reports numbness and  tingling in her hands when she wakes up in the morning or in the evening after work. -  She reports abnormal gait.  It feels like her feet are stuck in cement.  She has trouble lifting her feet.  It causes hip pain.  She trips over her feet.  It is worse when there is an extreme change in temperature.  She started having trouble in her mid-30s.  Worse over the past 2 years.  She reports a stabbing pain in her hips or knees.  She reports previously being diagnosed with a pinched nerve in the back that caused a left foot drop in the 20s. She had PT and injections which was ineffective.  No numbness in the legs.   -  When she throws a ball, her hand doesn't release immediately.  There is a delay until she is able to release the ball.  She has similar problems with her left hand.  No neck pain. -  She reports fatigued.  She needs to take a nap during the day.  Prior sleep study was normal.   One of her two sisters has multiple sclerosis.   Other history:  She has Crohn's disease.  Earlier this year, she developed a DVT in her right leg.  She also was found to have B12 deficiency and is being treated with injections.   01/09/18 LABS:  B12 143, Sed rate 18.  Hypercoagulable panel unremarkable, including lupus anticoagulant panel, dRVVT mix, beta-2-glycoprotein, cardiolipin antibodies, prothrombin gene mutation, factor V Leiden.  NMO/AQP4+ antibody was negative.  Her insurance would not cover anti-MOG antibody testing.   09/2018 LABS:  JC Virus antibody was positive with index of 3.26.  Quantiferon-TB Gold Plus negative.  She tested negative for Hep B.      Imaging: 07/22/2018:  MRI C & T-SPINE W WO:  Multiple T2/STIR hyperintense signal within the spinal cord at C2, C3-C4, C6-C7, T1, T3-T4, T6-T7, and T12 levels, all non-enhancing.  She also demonstrates degenerative spinal stenosis at C4-C5 and C5-C6 with mild spinal cord mass effect. 07/24/2018:  MRI BRAIN W WO:  Mild hyperintense in the periventricular  and juxtacortical white matter with signal running perpendicular from the lateral ventricle and involving the corpus callosum, non-enhancing. 04/05/2020 MRI C & T-SPINE W WO:  Chronic patchy multifocal cord signal abnormality throughout cervical and thoracic cord, overall stable compared to 2019.  No active lesions.  Degenerative cervical spondylosis at C4-5 and C5-6 with mild to moderate spinal stenosis and moderate left C5 foraminal narrowing. 05/02/2020 MRI BRAIN W WO:  Unchanged mild subcortical/juxtacortical and deep periventricular white matter foci of signal abnormality consistent with the provided history of multiple sclerosis. No new or enhancing lesion is identified.  Past DMT:  Ocrevus (effective but would develop recurrence of chronic symptoms prior to] next infusion), Kesimpta (possibly caused Crohn's flare)  Past medications:  gabapentin   PAST MEDICAL HISTORY: Past Medical History:  Diagnosis Date   Anemia    related to Crohns flares   Arm DVT (deep venous thromboembolism), acute, right ([place]) 11/29/2017   Arthritis    knees, feet, hands, wrists, related to Crohns flare  Asthma    Childhood   B12 deficiency    monitored/treated by Dr. Carlean Purl   Cervical dysplasia    Clotting disorder [facility])    on eliquis hx dvt/ pulmonary embolism   COVID 04/13/2021   Crohn disease ([place])    Crohn's disease of small intestine ([place]) 06/24/2012   Diagnosed 1999, in [place]. Ileitis only then. Treated with Imuran Remicade and prednisone.  Noncompliant with therapy. 2004 return to care and was treated with Remicade prednisone Pentasa Cipro and Flagyl. 2006 status post right hemicolectomy. Subsequently treated with azathioprine and Cimzia. 200 mg every other week.  azathioprine was added in 2010. Prometheus TP MT enzyme was negative.   Depression    DVT of upper extremity (deep vein thrombosis) ([place]) 11/28/2017   Eczema    arms and behind knees, worse in winter   Generalized anxiety disorder     History of recurrent UTI (urinary tract infection)    HPV (human papilloma virus) infection    Hyperlipemia    Hypertriglyceridemia 07/03/2012   10/2010 labs showed level of 753 with total cholesterol 180 and HDL 41' Labs 05/2013--TG 180    Major depressive disorder in remission ([place]) 10/03/2013   MS (multiple sclerosis) ([place])    Multiple sclerosis ([place]) 2019   Officially diagnosed in the Fall   Neuromuscular disorder ([place])    Osteopenia 09/29/2011   T-1.6   Papanicolaou smear 10/2011   last abnormal 2011   Pulmonary embolism ([place]) 11/30/2017   Scaphoid fracture of wrist 2013   left   Seasonal allergic rhinitis    Shingles 09/2015   R hip   Vitamin B12 deficiency 06/19/2012   Vitamin D deficiency 12/16/2015   Vitamin D-OH level 12     MEDICATIONS: Current Outpatient Medications on File Prior to Visit  Medication Sig Dispense Refill   acetaminophen (TYLENOL) 500 MG tablet Take 1,000 mg by mouth every 6 (six) hours as needed for headache (pain). Reported on 12/15/2015     apixaban (ELIQUIS) 5 MG TABS tablet Take 1 tablet (5 mg total) by mouth 2 (two) times daily. 60 tablet 11   baclofen (LIORESAL) 10 MG tablet TAKE 1 TABLET(10 MG) BY MOUTH THREE TIMES DAILY AS NEEDED FOR MUSCLE SPASMS 30 tablet 5   buPROPion ER (WELLBUTRIN SR) 100 MG 12 hr tablet TAKE 1 TABLET BY MOUTH  TWICE DAILY 180 tablet 0   cetirizine (ZYRTEC) 10 MG tablet Take 10 mg by mouth at bedtime.      CHOLECALCIFEROL PO Take 5,000 Units by mouth 2 (two) times daily.     dalfampridine 10 MG TB12 TAKE 1 TABLET BY MOUTH  EVERY 12 HOURS 60 tablet 5   diphenoxylate-atropine (LOMOTIL) 2.5-0.025 MG tablet 1 to 2 tablets 4 times a day as needed for diarrhea 90 tablet 0   DODEX 1000 MCG/ML injection INJECT 1 ML INTO THE SKIN EVERY 14 DAYS 10 mL 2   escitalopram (LEXAPRO) 10 MG tablet TAKE 1 TABLET BY MOUTH AT  BEDTIME 90 tablet 3   fenofibrate 160 MG tablet TAKE 1 TABLET BY MOUTH AT BEDTIME 30 tablet 5   gabapentin  (NEURONTIN) 100 MG capsule TAKE 3 CAPSULES(300 MG) BY MOUTH AT BEDTIME 180 capsule 0   Insulin Syringe 27G X 1/2" 1 ML MISC 1 each by Does not apply route every 14 (fourteen) days. For [place] B12 injection 24 each 1   levonorgestrel (MIRENA) 20 MCG/24HR IUD 1 each by Intrauterine route once. Implanted April 2017     NON  FORMULARY Take 1 each by mouth at bedtime. CBD/ cannabis     vedolizumab (ENTYVIO) 300 MG injection Inject 300 mg into the vein every 28 (twenty-eight) days. 300 each 6   No current facility-administered medications on file prior to visit.    ALLERGIES: Allergies  Allergen Reactions   Iron Other (See Comments)    Blood in stool/Rectal Bleeding   Venofer [Iron Sucrose] Other (See Comments)    Fever, infusion reaction    Ibuprofen Other (See Comments)    Avoids due to Crohns disease   Morphine And Related Itching    itchy   Prednisone Other (See Comments)    "crazy", hallucinations Tolerates methylprednisolone   Adhesive [Tape] Rash    Rash with electrodes    FAMILY HISTORY: Family History  Problem Relation Age of Onset   Hypertension Mother    Breast cancer Mother 76   Bone cancer Mother        metastatic from breast   Depression Mother    Hypothyroidism Mother    Colon polyps Father    Diabetes Father        borderline--resolved 2020   Hypertension Father        off meds now   Hyperlipidemia Father        off meds now   Fuch's dystrophy Father    Multiple sclerosis Sister        affecting brain only   Alcohol abuse Sister    Fuch's dystrophy Sister    Hypertension Sister    Heart disease Sister        poss MI in 60's   Stroke Maternal Grandmother    Lung cancer Maternal Grandmother    Hypertension Maternal Grandmother    Heart disease Paternal Grandmother    Hypertension Paternal Grandmother    Colon cancer Paternal Grandfather    Stomach cancer Neg Hx    Esophageal cancer Neg Hx    Pancreatic cancer Neg Hx       Objective:  Blood pressure  110/75, pulse (!) 117, height 5' 4"  (1.626 m), weight 208 lb (94.3 kg), SpO2 95 %. General: No acute distress.  Patient appears well-groomed.   Head:  Normocephalic/atraumatic Eyes:  Fundi examined but not visualized Neck: supple, no paraspinal tenderness, full range of motion Heart:  Regular rate and rhythm Lungs:  Clear to auscultation bilaterally Back: No paraspinal tenderness Neurological Exam: alert and oriented to person, place, and time. Speech fluent and not dysarthric, language intact.  CN II-XII intact. Bulk normal, increased tone in lower extremities, muscle strength 5-/5 right hip flexion otherwise 5/5 throughout.  Sensation to pinprick reduced in right upper extremity and left lower extremities and reduced vibratory sensation in left lower extremity.  Deep tendon reflexes 2+ throughout, bilateral Babinski.  Finger to nose testing intact.  Spastic gait. Using cane.  Romberg with mild sway.   Metta Clines, DO  CC: Even Tomi Bamberger, MD

## 2021-12-22 ENCOUNTER — Ambulatory Visit (INDEPENDENT_AMBULATORY_CARE_PROVIDER_SITE_OTHER): Payer: 59 | Admitting: Neurology

## 2021-12-22 ENCOUNTER — Other Ambulatory Visit: Payer: Self-pay

## 2021-12-22 ENCOUNTER — Encounter: Payer: Self-pay | Admitting: Neurology

## 2021-12-22 DIAGNOSIS — G35 Multiple sclerosis: Secondary | ICD-10-CM | POA: Diagnosis not present

## 2021-12-22 DIAGNOSIS — M62838 Other muscle spasm: Secondary | ICD-10-CM

## 2021-12-22 MED ORDER — BACLOFEN 10 MG PO TABS: ORAL_TABLET | ORAL | 5 refills | Status: DC

## 2021-12-22 NOTE — Patient Instructions (Addendum)
We will hold off on starting a new disease modifying therapy Continue D3 5000 IU twice daily Baclofen up to three times daily as needed refilled Continue gabapentin and Ampyra Check MRI of brain/cervical/thoracic spine with and without contrast in 6 months Check vit D level in 6 months Follow up after repeat MRI and lab

## 2021-12-25 ENCOUNTER — Other Ambulatory Visit: Payer: Self-pay | Admitting: Family Medicine

## 2021-12-25 DIAGNOSIS — Z7901 Long term (current) use of anticoagulants: Secondary | ICD-10-CM

## 2021-12-25 DIAGNOSIS — Z86711 Personal history of pulmonary embolism: Secondary | ICD-10-CM

## 2021-12-29 ENCOUNTER — Other Ambulatory Visit: Payer: Self-pay | Admitting: Neurology

## 2021-12-29 NOTE — Progress Notes (Unsigned)
46 y.o. G70P0000 Married Caucasian female here for annual exam.    PCP:     No LMP recorded. (Menstrual status: IUD).           Sexually active: {yes no:314532}  The current method of family planning is IUD--Mirena 02-12-16.    Exercising: {yes WY:637858}  {types:19826} Smoker:  Former  Health Maintenance: Pap:  04-27-19 Neg:Neg HR HPV, 01-09-18 Neg:Neg HR HPV, 01-13-16 Neg:Neg HR HPV History of abnormal Pap:  Yes, hx of LEEP ~ 2010. MMG:  01-21-21 Neg/BiRads1 Colonoscopy:  02-03-21  BMD:  10-14-16  Result :Osteopenia TDaP:  12-15-15 Gardasil:   no HIV: Neg in the past Hep C: 06-15-21 Neg Screening Labs:  Hb today: ***, Urine today: ***   reports that she quit smoking about 23 years ago. Her smoking use included cigarettes. She has a 4.00 pack-year smoking history. She has never used smokeless tobacco. She reports that she does not currently use alcohol. She reports current drug use. Frequency: 4.00 times per week. Drug: Marijuana.  Past Medical History:  Diagnosis Date   Anemia    related to Crohns flares   Arm DVT (deep venous thromboembolism), acute, right ([place]) 11/29/2017   Arthritis    knees, feet, hands, wrists, related to Crohns flare   Asthma    Childhood   B12 deficiency    monitored/treated by Dr. Carlean Purl   Cervical dysplasia    Clotting disorder ([place])    on eliquis hx dvt/ pulmonary embolism   COVID 04/13/2021   Crohn disease ([place])    Crohn's disease of small intestine ([place]) 06/24/2012   Diagnosed 1999, in [place]. Ileitis only then. Treated with Imuran Remicade and prednisone.  Noncompliant with therapy. 2004 return to care and was treated with Remicade prednisone Pentasa Cipro and Flagyl. 2006 status post right hemicolectomy. Subsequently treated with azathioprine and Cimzia. 200 mg every other week.  azathioprine was added in 2010. Prometheus TP MT enzyme was negative.   Depression    DVT of upper extremity (deep vein thrombosis) ([place]) 11/28/2017   Eczema    arms  and behind knees, worse in winter   Generalized anxiety disorder    History of recurrent UTI (urinary tract infection)    HPV (human papilloma virus) infection    Hyperlipemia    Hypertriglyceridemia 07/03/2012   10/2010 labs showed level of 753 with total cholesterol 180 and HDL 41' Labs 05/2013--TG 180    Major depressive disorder in remission ([place]) 10/03/2013   MS (multiple sclerosis) ([place])    Multiple sclerosis ([place]) 2019   Officially diagnosed in the Fall   Neuromuscular disorder ([place])    Osteopenia 09/29/2011   T-1.6   Papanicolaou smear 10/2011   last abnormal 2011   Pulmonary embolism ([place]) 11/30/2017   Scaphoid fracture of wrist 2013   left   Seasonal allergic rhinitis    Shingles 09/2015   R hip   Vitamin B12 deficiency 06/19/2012   Vitamin D deficiency 12/16/2015   Vitamin D-OH level 12     Past Surgical History:  Procedure Laterality Date   APPENDECTOMY     CERVICAL BIOPSY  W/ LOOP ELECTRODE EXCISION  2009   ---paps normal since   COLONOSCOPY  multiple   scanned   ESOPHAGOGASTRODUODENOSCOPY  multiple   scanned   HEMICOLECTOMY  2006   IR ANGIOGRAM PULMONARY BILATERAL SELECTIVE  12/03/2017   IR ANGIOGRAM SELECTIVE EACH ADDITIONAL VESSEL  12/03/2017   IR ANGIOGRAM SELECTIVE EACH ADDITIONAL VESSEL  12/03/2017   IR  INFUSION THROMBOL ARTERIAL INITIAL (MS)  12/03/2017   IR INFUSION THROMBOL ARTERIAL INITIAL (MS)  12/03/2017   IR IVC FILTER PLMT / S&I /IMG GUID/MOD SED  12/04/2017   IR THROMB F/U EVAL ART/VEN FINAL DAY ([place])  12/04/2017   IR US GUIDE VASC ACCESS RIGHT  12/03/2017   IVC FILTER REMOVAL N/A 07/11/2018   Procedure: IVC FILTER REMOVAL;  Surgeon: Serafina Mitchell, MD;  Location: [facility];  Service: Cardiovascular;  Laterality: N/A;   IVC VENOGRAPHY N/A 07/11/2018   Procedure: IVC Venography;  Surgeon: Serafina Mitchell, MD;  Location: [facility];  Service: Cardiovascular;  Laterality: N/A;   LEEP  2011   --done in [place] Right 11/30/2017   Procedure: PERIPHERAL VASCULAR BALLOON ANGIOPLASTY;  Surgeon: Serafina Mitchell, MD;  Location: [facility];  Service: Cardiovascular;  Laterality: Right;   PERIPHERAL VASCULAR THROMBECTOMY N/A 11/29/2017   Procedure: PERIPHERAL VASCULAR THROMBECTOMY - THROMBOLYSIS;  Surgeon: Serafina Mitchell, MD;  Location: [facility];  Service: Cardiovascular;  Laterality: N/A;  LYSIS CATHETER PLACEMENT ONLY   PERIPHERAL VASCULAR THROMBECTOMY N/A 11/30/2017   Procedure: PERIPHERAL VASCULAR THROMBECTOMY - Lysis Recheck;  Surgeon: Serafina Mitchell, MD;  Location: [facility];  Service: Cardiovascular;  Laterality: N/A;   WISDOM TOOTH EXTRACTION      Current Outpatient Medications  Medication Sig Dispense Refill   acetaminophen (TYLENOL) 500 MG tablet Take 1,000 mg by mouth every 6 (six) hours as needed for headache (pain). Reported on 12/15/2015     baclofen (LIORESAL) 10 MG tablet TAKE 1 TABLET(10 MG) BY MOUTH THREE TIMES DAILY AS NEEDED FOR MUSCLE SPASMS 90 tablet 5   buPROPion ER (WELLBUTRIN SR) 100 MG 12 hr tablet TAKE 1 TABLET BY MOUTH  TWICE DAILY 180 tablet 0   cetirizine (ZYRTEC) 10 MG tablet Take 10 mg by mouth at bedtime.      CHOLECALCIFEROL PO Take 5,000 Units by mouth 2 (two) times daily.     dalfampridine 10 MG TB12 TAKE 1 TABLET BY MOUTH  EVERY 12 HOURS 60 tablet 5   DODEX 1000 MCG/ML injection INJECT 1 ML INTO THE SKIN EVERY 14 DAYS 10 mL 2   ELIQUIS 5 MG TABS tablet TAKE 1 TABLET(5 MG) BY MOUTH TWICE DAILY 60 tablet 3   escitalopram (LEXAPRO) 10 MG tablet TAKE 1 TABLET BY MOUTH AT  BEDTIME 90 tablet 3   fenofibrate 160 MG tablet TAKE 1 TABLET BY MOUTH AT  BEDTIME 90 tablet 0   gabapentin (NEURONTIN) 100 MG capsule TAKE 3 CAPSULES(300 MG) BY MOUTH AT BEDTIME 180 capsule 0   Insulin Syringe 27G X 1/2" 1 ML MISC 1 each by Does not apply route every 14 (fourteen) days. For  B12 injection 24 each 1   levonorgestrel (MIRENA) 20 MCG/24HR IUD 1 each by  Intrauterine route once. Implanted April 2017     NON FORMULARY Take 1 each by mouth at bedtime. CBD/ cannabis     oxyCODONE (OXY IR/ROXICODONE) 5 MG immediate release tablet Take 5 mg by mouth 4 (four) times daily as needed.     No current facility-administered medications for this visit.    Family History  Problem Relation Age of Onset   Hypertension Mother    Breast cancer Mother 42   Bone cancer Mother        metastatic from breast   Depression Mother    Hypothyroidism Mother  Colon polyps Father    Diabetes Father        borderline--resolved 2020   Hypertension Father        off meds now   Hyperlipidemia Father        off meds now   Fuch's dystrophy Father    Multiple sclerosis Sister        affecting brain only   Alcohol abuse Sister    Fuch's dystrophy Sister    Hypertension Sister    Heart disease Sister        poss MI in 41's   Stroke Maternal Grandmother    Lung cancer Maternal Grandmother    Hypertension Maternal Grandmother    Heart disease Paternal Grandmother    Hypertension Paternal Grandmother    Colon cancer Paternal Grandfather    Stomach cancer Neg Hx    Esophageal cancer Neg Hx    Pancreatic cancer Neg Hx     Review of Systems  Exam:   There were no vitals taken for this visit.    General appearance: alert, cooperative and appears stated age Head: normocephalic, without obvious abnormality, atraumatic Neck: no adenopathy, supple, symmetrical, trachea midline and thyroid normal to inspection and palpation Lungs: clear to auscultation bilaterally Breasts: normal appearance, no masses or tenderness, No nipple retraction or dimpling, No nipple discharge or bleeding, No axillary adenopathy Heart: regular rate and rhythm Abdomen: soft, non-tender; no masses, no organomegaly Extremities: extremities normal, atraumatic, no cyanosis or edema Skin: skin color, texture, turgor normal. No rashes or lesions Lymph nodes: cervical, supraclavicular, and  axillary nodes normal. Neurologic: grossly normal  Pelvic: External genitalia:  no lesions              No abnormal inguinal nodes palpated.              Urethra:  normal appearing urethra with no masses, tenderness or lesions              Bartholins and Skenes: normal                 Vagina: normal appearing vagina with normal color and discharge, no lesions              Cervix: no lesions              Pap taken: {yes no:314532} Bimanual Exam:  Uterus:  normal size, contour, position, consistency, mobility, non-tender              Adnexa: no mass, fullness, tenderness              Rectal exam: {yes no:314532}.  Confirms.              Anus:  normal sphincter tone, no lesions  Chaperone was present for exam:  ***  Assessment:   Well woman visit with gynecologic exam.   Plan: Mammogram screening discussed. Self breast awareness reviewed. Pap and HR HPV as above. Guidelines for Calcium, Vitamin D, regular exercise program including cardiovascular and weight bearing exercise.   Follow up annually and prn.   Additional counseling given.  {yes [id number]. _______ minutes face to face time of which over 50% was spent in counseling.    After visit summary provided.

## 2021-12-31 ENCOUNTER — Ambulatory Visit: Payer: 59 | Admitting: Obstetrics and Gynecology

## 2022-01-05 ENCOUNTER — Telehealth: Payer: Self-pay

## 2022-01-05 DIAGNOSIS — Z0000 Encounter for general adult medical examination without abnormal findings: Secondary | ICD-10-CM

## 2022-01-05 DIAGNOSIS — D500 Iron deficiency anemia secondary to blood loss (chronic): Secondary | ICD-10-CM

## 2022-01-05 DIAGNOSIS — E781 Pure hyperglyceridemia: Secondary | ICD-10-CM

## 2022-01-05 NOTE — Telephone Encounter (Signed)
Pt coming in tomorrow for fasting labs for next weeks CPE,  please put in orders.

## 2022-01-05 NOTE — Telephone Encounter (Signed)
Lots of labs done recently, reviewed. ?Had normal B12 11/2021, ?She has high WBC and anemia (recent hospitalization, surgery). ?She has f/u with Dr. Irene Limbo next week, who has been monitoring her for anemia (which had resolved at his last visit 05/2021). ?I'm going to order CBC and ferritin/iron studies and forward to Dr. Irene Limbo. ? ?Really from me only needs lipids, and perhaps a fasting glucose (since she will be fasting; she did have a normal A1c not too long ago). ? ?

## 2022-01-06 ENCOUNTER — Other Ambulatory Visit: Payer: 59

## 2022-01-06 ENCOUNTER — Other Ambulatory Visit: Payer: Self-pay

## 2022-01-06 ENCOUNTER — Other Ambulatory Visit: Payer: Self-pay | Admitting: Family Medicine

## 2022-01-06 DIAGNOSIS — F325 Major depressive disorder, single episode, in full remission: Secondary | ICD-10-CM

## 2022-01-06 DIAGNOSIS — D500 Iron deficiency anemia secondary to blood loss (chronic): Secondary | ICD-10-CM

## 2022-01-06 DIAGNOSIS — E781 Pure hyperglyceridemia: Secondary | ICD-10-CM

## 2022-01-06 DIAGNOSIS — Z0000 Encounter for general adult medical examination without abnormal findings: Secondary | ICD-10-CM

## 2022-01-06 LAB — LIPID PANEL

## 2022-01-07 ENCOUNTER — Encounter: Payer: Self-pay | Admitting: Hematology

## 2022-01-07 LAB — IRON,TIBC AND FERRITIN PANEL
Ferritin: 87 ng/mL (ref 15–150)
Iron Saturation: 12 % — ABNORMAL LOW (ref 15–55)
Iron: 73 ug/dL (ref 27–159)
Total Iron Binding Capacity: 618 ug/dL (ref 250–450)
UIBC: 545 ug/dL — ABNORMAL HIGH (ref 131–425)

## 2022-01-07 LAB — CBC WITH DIFFERENTIAL/PLATELET
Basophils Absolute: 0.1 10*3/uL (ref 0.0–0.2)
Basos: 1 %
EOS (ABSOLUTE): 0.3 10*3/uL (ref 0.0–0.4)
Eos: 3 %
Hematocrit: 43.5 % (ref 34.0–46.6)
Hemoglobin: 13.4 g/dL (ref 11.1–15.9)
Immature Grans (Abs): 0 10*3/uL (ref 0.0–0.1)
Immature Granulocytes: 0 %
Lymphocytes Absolute: 1.1 10*3/uL (ref 0.7–3.1)
Lymphs: 13 %
MCH: 26.2 pg — ABNORMAL LOW (ref 26.6–33.0)
MCHC: 30.8 g/dL — ABNORMAL LOW (ref 31.5–35.7)
MCV: 85 fL (ref 79–97)
Monocytes Absolute: 0.8 10*3/uL (ref 0.1–0.9)
Monocytes: 9 %
Neutrophils Absolute: 6.4 10*3/uL (ref 1.4–7.0)
Neutrophils: 74 %
Platelets: 588 10*3/uL — ABNORMAL HIGH (ref 150–450)
RBC: 5.11 x10E6/uL (ref 3.77–5.28)
RDW: 14.7 % (ref 11.7–15.4)
WBC: 8.6 10*3/uL (ref 3.4–10.8)

## 2022-01-07 LAB — LIPID PANEL
Chol/HDL Ratio: 2.4 ratio (ref 0.0–4.4)
Cholesterol, Total: 199 mg/dL (ref 100–199)
HDL: 82 mg/dL (ref >39)
LDL Chol Calc (NIH): 77 mg/dL (ref 0–99)
Triglycerides: 251 mg/dL — ABNORMAL HIGH (ref 0–149)
VLDL Cholesterol Cal: 40 mg/dL (ref 5–40)

## 2022-01-07 LAB — GLUCOSE, RANDOM: Glucose: 77 mg/dL (ref 70–99)

## 2022-01-08 ENCOUNTER — Other Ambulatory Visit: Payer: Self-pay

## 2022-01-08 DIAGNOSIS — D509 Iron deficiency anemia, unspecified: Secondary | ICD-10-CM

## 2022-01-08 DIAGNOSIS — D500 Iron deficiency anemia secondary to blood loss (chronic): Secondary | ICD-10-CM

## 2022-01-11 ENCOUNTER — Other Ambulatory Visit: Payer: Self-pay | Admitting: Hematology

## 2022-01-12 ENCOUNTER — Other Ambulatory Visit: Payer: Self-pay

## 2022-01-12 DIAGNOSIS — D509 Iron deficiency anemia, unspecified: Secondary | ICD-10-CM

## 2022-01-13 ENCOUNTER — Other Ambulatory Visit: Payer: Self-pay

## 2022-01-13 ENCOUNTER — Inpatient Hospital Stay: Payer: 59 | Attending: Hematology

## 2022-01-13 ENCOUNTER — Inpatient Hospital Stay (HOSPITAL_BASED_OUTPATIENT_CLINIC_OR_DEPARTMENT_OTHER): Payer: 59 | Admitting: Hematology

## 2022-01-13 ENCOUNTER — Encounter: Payer: Self-pay | Admitting: Hematology

## 2022-01-13 VITALS — BP 116/72 | HR 94 | Temp 97.9°F | Resp 20 | Wt 209.8 lb

## 2022-01-13 DIAGNOSIS — Z800 Family history of malignant neoplasm of digestive organs: Secondary | ICD-10-CM | POA: Diagnosis not present

## 2022-01-13 DIAGNOSIS — D509 Iron deficiency anemia, unspecified: Secondary | ICD-10-CM

## 2022-01-13 DIAGNOSIS — Z888 Allergy status to other drugs, medicaments and biological substances status: Secondary | ICD-10-CM | POA: Insufficient documentation

## 2022-01-13 DIAGNOSIS — E669 Obesity, unspecified: Secondary | ICD-10-CM | POA: Diagnosis not present

## 2022-01-13 DIAGNOSIS — Z9049 Acquired absence of other specified parts of digestive tract: Secondary | ICD-10-CM | POA: Diagnosis not present

## 2022-01-13 DIAGNOSIS — Z87440 Personal history of urinary (tract) infections: Secondary | ICD-10-CM | POA: Insufficient documentation

## 2022-01-13 DIAGNOSIS — Z86718 Personal history of other venous thrombosis and embolism: Secondary | ICD-10-CM | POA: Diagnosis not present

## 2022-01-13 DIAGNOSIS — Z6836 Body mass index (BMI) 36.0-36.9, adult: Secondary | ICD-10-CM | POA: Diagnosis not present

## 2022-01-13 DIAGNOSIS — Z79899 Other long term (current) drug therapy: Secondary | ICD-10-CM | POA: Insufficient documentation

## 2022-01-13 DIAGNOSIS — Z833 Family history of diabetes mellitus: Secondary | ICD-10-CM | POA: Insufficient documentation

## 2022-01-13 DIAGNOSIS — Z803 Family history of malignant neoplasm of breast: Secondary | ICD-10-CM | POA: Diagnosis not present

## 2022-01-13 DIAGNOSIS — Z818 Family history of other mental and behavioral disorders: Secondary | ICD-10-CM | POA: Diagnosis not present

## 2022-01-13 DIAGNOSIS — Z86711 Personal history of pulmonary embolism: Secondary | ICD-10-CM | POA: Insufficient documentation

## 2022-01-13 DIAGNOSIS — E538 Deficiency of other specified B group vitamins: Secondary | ICD-10-CM | POA: Diagnosis not present

## 2022-01-13 DIAGNOSIS — Z87891 Personal history of nicotine dependence: Secondary | ICD-10-CM | POA: Insufficient documentation

## 2022-01-13 DIAGNOSIS — Z811 Family history of alcohol abuse and dependence: Secondary | ICD-10-CM | POA: Insufficient documentation

## 2022-01-13 DIAGNOSIS — Z83438 Family history of other disorder of lipoprotein metabolism and other lipidemia: Secondary | ICD-10-CM | POA: Insufficient documentation

## 2022-01-13 DIAGNOSIS — Z801 Family history of malignant neoplasm of trachea, bronchus and lung: Secondary | ICD-10-CM | POA: Insufficient documentation

## 2022-01-13 DIAGNOSIS — Z7901 Long term (current) use of anticoagulants: Secondary | ICD-10-CM | POA: Diagnosis not present

## 2022-01-13 DIAGNOSIS — K5090 Crohn's disease, unspecified, without complications: Secondary | ICD-10-CM | POA: Diagnosis not present

## 2022-01-13 DIAGNOSIS — Z823 Family history of stroke: Secondary | ICD-10-CM | POA: Insufficient documentation

## 2022-01-13 DIAGNOSIS — E611 Iron deficiency: Secondary | ICD-10-CM | POA: Diagnosis present

## 2022-01-13 DIAGNOSIS — Z885 Allergy status to narcotic agent status: Secondary | ICD-10-CM | POA: Insufficient documentation

## 2022-01-13 LAB — IRON AND IRON BINDING CAPACITY (CC-WL,HP ONLY)
Iron: 44 ug/dL (ref 28–170)
Saturation Ratios: 6 % — ABNORMAL LOW (ref 10.4–31.8)
TIBC: 705 ug/dL — ABNORMAL HIGH (ref 250–450)
UIBC: 661 ug/dL

## 2022-01-13 LAB — CBC WITH DIFFERENTIAL (CANCER CENTER ONLY)
Abs Immature Granulocytes: 0.02 10*3/uL (ref 0.00–0.07)
Basophils Absolute: 0 10*3/uL (ref 0.0–0.1)
Basophils Relative: 0 %
Eosinophils Absolute: 0.2 10*3/uL (ref 0.0–0.5)
Eosinophils Relative: 2 %
HCT: 41.5 % (ref 36.0–46.0)
Hemoglobin: 13 g/dL (ref 12.0–15.0)
Immature Granulocytes: 0 %
Lymphocytes Relative: 12 %
Lymphs Abs: 1.1 10*3/uL (ref 0.7–4.0)
MCH: 26.7 pg (ref 26.0–34.0)
MCHC: 31.3 g/dL (ref 30.0–36.0)
MCV: 85.2 fL (ref 80.0–100.0)
Monocytes Absolute: 0.9 10*3/uL (ref 0.1–1.0)
Monocytes Relative: 10 %
Neutro Abs: 6.8 10*3/uL (ref 1.7–7.7)
Neutrophils Relative %: 76 %
Platelet Count: 436 10*3/uL — ABNORMAL HIGH (ref 150–400)
RBC: 4.87 MIL/uL (ref 3.87–5.11)
RDW: 16.2 % — ABNORMAL HIGH (ref 11.5–15.5)
WBC Count: 9 10*3/uL (ref 4.0–10.5)
nRBC: 0 % (ref 0.0–0.2)

## 2022-01-13 LAB — CMP (CANCER CENTER ONLY)
ALT: 114 U/L — ABNORMAL HIGH (ref 0–44)
AST: 74 U/L — ABNORMAL HIGH (ref 15–41)
Albumin: 4.4 g/dL (ref 3.5–5.0)
Alkaline Phosphatase: 127 U/L — ABNORMAL HIGH (ref 38–126)
Anion gap: 6 (ref 5–15)
BUN: 22 mg/dL — ABNORMAL HIGH (ref 6–20)
CO2: 27 mmol/L (ref 22–32)
Calcium: 9.1 mg/dL (ref 8.9–10.3)
Chloride: 100 mmol/L (ref 98–111)
Creatinine: 1.04 mg/dL — ABNORMAL HIGH (ref 0.44–1.00)
GFR, Estimated: >60 mL/min (ref >60)
Glucose, Bld: 88 mg/dL (ref 70–99)
Potassium: 4.3 mmol/L (ref 3.5–5.1)
Sodium: 133 mmol/L — ABNORMAL LOW (ref 135–145)
Total Bilirubin: 0.4 mg/dL (ref 0.3–1.2)
Total Protein: 8.4 g/dL — ABNORMAL HIGH (ref 6.5–8.1)

## 2022-01-13 LAB — FERRITIN: Ferritin: 56 ng/mL (ref 11–307)

## 2022-01-13 NOTE — Patient Instructions (Addendum)
?  HEALTH MAINTENANCE RECOMMENDATIONS: ? ?It is recommended that you get at least 30 minutes of aerobic exercise at least 5 days/week (for weight loss, you may need as much as 60-90 minutes). This can be any activity that gets your heart rate up. This can be divided in 10-15 minute intervals if needed, but try and build up your endurance at least once a week.  Weight bearing exercise is also recommended twice weekly. ? ?Eat a healthy diet with lots of vegetables, fruits and fiber.  "Colorful" foods have a lot of vitamins (ie green vegetables, tomatoes, red peppers, etc).  Limit sweet tea, regular sodas and alcoholic beverages, all of which has a lot of calories and sugar.  Up to 1 alcoholic drink daily may be beneficial for women (unless trying to lose weight, watch sugars).  Drink a lot of water. ? ?Calcium recommendations are 1200-1500 mg daily (1500 mg for postmenopausal women or women without ovaries), and vitamin D 1000 IU daily.  This should be obtained from diet and/or supplements (vitamins), and calcium should not be taken all at once, but in divided doses. ? ?Monthly self breast exams and yearly mammograms for women over the age of 78 is recommended. ? ?Sunscreen of at least SPF 30 should be used on all sun-exposed parts of the skin when outside between the hours of 10 am and 4 pm (not just when at beach or pool, but even with exercise, golf, tennis, and yard work!)  Use a sunscreen that says "broad spectrum" so it covers both UVA and UVB rays, and make sure to reapply every 1-2 hours. ? ?Remember to change the batteries in your smoke detectors when changing your clock times in the spring and fall. Carbon monoxide detectors are recommended for your home. ? ?Use your seat belt every time you are in a car, and please drive safely and not be distracted with cell phones and texting while driving. ? ?Please try and cut back on the fried, greasy foods--this is the bigger culprit in your triglycerides being so  high.  This may also be affecting the liver, if there is fatty liver changes from the high triglycerides. ?Please touch base with Dr. Carlean Purl regarding the newly elevated liver tests (from yesterday). ? ?I recommend getting a pneumovax booster, and the bivalent COVID booster. You can schedule nurse visits at your convenience vs get from the pharmacy.  They can be the same day, or must wait 2 weeks apart.  ? ? ?

## 2022-01-13 NOTE — Progress Notes (Signed)
?Chief Complaint  ?Patient presents with  ? Annual Exam  ?  Nonfasting annual exam, no pap sees Dr. Quincy Simmonds, is overdue and will schedule and will schedule mammo as well. No concerns today. No f/u sch with Dr. Carlean Purl as of now. Would like to wait on pneumovax right now and does not want covid booster today. She is still getting B12 shots.    ? ? ?Lindsay Mccarthy is a 46 y.o. female who presents for a complete physical.  ? ?She has h/o DVT and PE, 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 She denies bleeding or bruising. Denies shortness of breath, chest pain or leg swelling.  Pt is under the care of Dr. Irene Limbo. She has iron deficiency anemia and elevated plt (felt to be reactive). ?She saw him yesterday, and did not need any iron infusion yet.  He thinks she may ultimately drop her down to a maintenance dose when Crohn's is better controlled. ? ?Lab Results  ?Component Value Date  ? WBC 9.0 01/13/2022  ? HGB 13.0 01/13/2022  ? HCT 41.5 01/13/2022  ? MCV 85.2 01/13/2022  ? PLT 436 (H) 01/13/2022  ? ?Lab Results  ?Component Value Date  ? IRON 44 01/13/2022  ? TIBC 705 (H) 01/13/2022  ? FERRITIN 56 01/13/2022  ? ?B12 deficiency--on injections every 2 weeks.  ?Lab Results  ?Component Value Date  ? [id number] 243 05/26/2021  ? ?She had recent B12 done 11/12/21 at [facility], level was 511. ? ? ?Crohn's disease--She is s/p ileocecectomy and end ileostomy on 12/11/21, for fibrostenotic Crohn's disease of the terminal ileum.   ?Pathology showed: ?ILEOCOLIC ANASTOMOSIS, RESECTION: ?             - Chronic active ileitis with ulceration, transmural chronic inflammation, and strictures. ?             - Colon near anastomosis site with mild chronic inactive colitis. ?- Serosal fibrous adhesions. ?- Four benign lymph nodes. ?             - Negative for dysplasia or malignancy. ? ?She had follow-up with surgeons earlier this week.  She will be out of work for another 2 weeks (full 6 weeks). She needs to wait at least 3 months to take down  the ostomy, but likely will wait until is better for work (possibly Fall). ?She remains under the care of Dr. Carlean Purl for her Crohn's.   ? ?Hypertriglyceridemia:  She is compliant with medications (fenofibrate) and denies side effects. TG's have remained above goal.  She has seen nutritionist in the past. ?She previously took omega-3 fish oil, which were stopped when anticoagulants were started.   ? ?She is on a low residue diet currently--a lot of carbs and protein (a lot of carbs since October). ?Not doing much sweets, more salty foods, per pt.  +fried foods "I'm eating like a teenage boy".  Pizza, ramen noodles, burgers, fried chicken. She is happy to be able to eat and not have abdominal pain.  She realizes she needs to get back to eating better. ? ?Last year she was on Weight Watchers, tried to limit her carbs (potatoes and rolled oats, trying to limit portions).  At that time she was eating out once a week, healthy choices, no fast food.  ?She plans to restart this once she can eat fruits and vegetables.  She hasn't been as careful with her diet also partly because she has been losing weight without needing to be so careful. ?  ?  Lab Results  ?Component Value Date  ? CHOL 199 01/06/2022  ? HDL 82 01/06/2022  ? [place] 77 01/06/2022  ? TRIG 251 (H) 01/06/2022  ? CHOLHDL 2.4 01/06/2022  ? ? ?Depression follow-up: She remains on Lexapro 48m daily and Wellbutrin SR 1076mBID. She notices that she sees the pill pass through in her stool/ostomy.  No impact yet on her moods, but she would like to continue to take it rather than a trial off. ?She previously didn't tolerate the higher dose of wellbutrin in the past.  Last year she reported the pharmacy switched generic for buproprion, and felt that it was working even better.  ?She continues to feel very good on these medications. ?  ?Vitamin D deficiency--found to be low at 21 in 09/2018.  She is currently taking 5000 IU twice daily, and had level rechecked 06/2021 and  was normal at 58.8.  Dr. JaTomi Likenslans to recheck this in 06/2022 (future orders in system). ?  ?MS--last saw Dr. JaTomi Likensn 12/2021. She had been put on Kesimpta (in place of Ocrevus) in 07/2021, getting the 3 starter doses only, which caused flare of Crohn's (for which she had recent surgery).  She thinks she also had some GI side effects from the [place] Park[place] hindsight). He elected to hold of on further disease-modifying treatment for now. She continues to take Ampyra, gabapentin, and baclofen.  He has her on D3 5000 IU BID and monitors levels (which have been normal).  He is planning to repeat MRI of brain, C and T spine in 6 months to establish new baseline.   ?She also notices the Ampyra passing through (sees it in her bag).  She can tell it isn't working as well, has some recurrent heavy feeling in her legs.  Her bladder has been fine (but hasn't been stressed).  She had been noticing more electric shocks, so increases gabapentin to 20025mn bad days (instead of just 100m2m? ?History of abnormal paps, HPV. Treated with LEEP in 2011. She is under the care of Dr. SilvQuincy Simmonds her GYN care, last seen 04/2020 for annual exam, and had normal pelvic US [place]/20[place]. She had normal paps in 12/2017 and 04/2019, with no HPV detected.  She had her Mirena IUD replaced in April 2017. She has never been pregnant, and doesn't want children. Proper Mirena position confirmed on US [id number]. ?She is waiting for her stomach to be a little less sore before scheduling appointment. ? ?Osteopenia:  DEXA 09/2013 showed T -1.6.  She had repeat study 10/2016, and had improved some.  She has since had Vitamin D deficiency diagnosed/treated. She takes 5000 IU BID.   ? ?She is no longer taking Viactiv, but getting calcium from milk, cottage cheese, Boost protein shakes, etc.  She doesn't get any weight-bearing exercise. ?  ? ?Immunization History  ?Administered Date(s) Administered  ? Hepatitis A 06/29/2012, 01/04/2013  ? Influenza Split 08/09/2012  ?  Influenza,inj,Quad PF,6+ Mos 09/18/2013, 10/07/2014, 12/15/2015, 08/02/2016, 10/05/2017, 07/18/2019  ? PFIZER(Purple Top)SARS-COV-2 Vaccination 05/24/2020, 06/14/2020, 11/14/2020  ? PPD Test 04/30/2013, 10/07/2014  ? Pneumococcal Conjugate-13 04/12/2018  ? Pneumococcal Polysaccharide-23 07/06/2012  ? Tdap 12/15/2015  ? ?Last Pap smear: 04/2019, normal with no high risk HPV ?Last mammogram: 12/2020 ?Last colonoscopy: 01/2021, along with EGD (Dr. GessCarlean Purlast DEXA: 10/2016 ?Ophtho: yearly ?Dentist: twice yearly  ?Exercise:  None since recent surgery, really none since October when she started feeling bad. ?Prior routine-- ?recumbent bike daily (most of the time), 5  minutes (legs can't tolerate more). She marches in place for 10-15 minutes daily. Only rarely uses hand weights. ?She sees dermatologist (Dr. Derrel Nip) regularly ? ? ?PMH, PSH, SH and FH reviewed and updated ? ?Outpatient Encounter Medications as of 01/14/2022  ?Medication Sig Note  ? baclofen (LIORESAL) 10 MG tablet TAKE 1 TABLET(10 MG) BY MOUTH THREE TIMES DAILY AS NEEDED FOR MUSCLE SPASMS   ? buPROPion ER (WELLBUTRIN SR) 100 MG 12 hr tablet TAKE 1 TABLET BY MOUTH  TWICE DAILY   ? cetirizine (ZYRTEC) 10 MG tablet Take 10 mg by mouth at bedtime.    ? CHOLECALCIFEROL PO Take 5,000 Units by mouth 2 (two) times daily.   ? dalfampridine 10 MG TB12 TAKE 1 TABLET BY MOUTH  EVERY 12 HOURS   ? DODEX 1000 MCG/ML injection INJECT 1 ML INTO THE SKIN EVERY 14 DAYS 01/14/2022: B12  ? ELIQUIS 5 MG TABS tablet TAKE 1 TABLET(5 MG) BY MOUTH TWICE DAILY   ? escitalopram (LEXAPRO) 10 MG tablet TAKE 1 TABLET BY MOUTH AT  BEDTIME   ? fenofibrate 160 MG tablet TAKE 1 TABLET BY MOUTH AT  BEDTIME   ? gabapentin (NEURONTIN) 100 MG capsule TAKE 3 CAPSULES(300 MG) BY MOUTH AT BEDTIME 01/14/2022: 100-200 qhs  ? Insulin Syringe 27G X 1/2" 1 ML MISC 1 each by Does not apply route every 14 (fourteen) days. For [place] B12 injection   ? levonorgestrel (MIRENA) 20 MCG/24HR IUD 1 each by  Intrauterine route once. Implanted April 2017   ? NON FORMULARY Take 1 each by mouth at bedtime. CBD/ cannabis   ? acetaminophen (TYLENOL) 500 MG tablet Take 1,000 mg by mouth every 6 (six) hours as needed fo

## 2022-01-14 ENCOUNTER — Telehealth: Payer: Self-pay | Admitting: Hematology

## 2022-01-14 ENCOUNTER — Encounter: Payer: Self-pay | Admitting: Internal Medicine

## 2022-01-14 ENCOUNTER — Ambulatory Visit: Payer: 59

## 2022-01-14 ENCOUNTER — Encounter: Payer: Self-pay | Admitting: Family Medicine

## 2022-01-14 ENCOUNTER — Ambulatory Visit (INDEPENDENT_AMBULATORY_CARE_PROVIDER_SITE_OTHER): Payer: 59 | Admitting: Family Medicine

## 2022-01-14 VITALS — BP 110/74 | HR 80 | Ht 64.0 in | Wt 209.0 lb

## 2022-01-14 DIAGNOSIS — Z7185 Encounter for immunization safety counseling: Secondary | ICD-10-CM

## 2022-01-14 DIAGNOSIS — E538 Deficiency of other specified B group vitamins: Secondary | ICD-10-CM

## 2022-01-14 DIAGNOSIS — K50012 Crohn's disease of small intestine with intestinal obstruction: Secondary | ICD-10-CM

## 2022-01-14 DIAGNOSIS — Z86711 Personal history of pulmonary embolism: Secondary | ICD-10-CM

## 2022-01-14 DIAGNOSIS — E781 Pure hyperglyceridemia: Secondary | ICD-10-CM

## 2022-01-14 DIAGNOSIS — R7989 Other specified abnormal findings of blood chemistry: Secondary | ICD-10-CM

## 2022-01-14 DIAGNOSIS — F325 Major depressive disorder, single episode, in full remission: Secondary | ICD-10-CM

## 2022-01-14 DIAGNOSIS — E559 Vitamin D deficiency, unspecified: Secondary | ICD-10-CM

## 2022-01-14 DIAGNOSIS — Z0000 Encounter for general adult medical examination without abnormal findings: Secondary | ICD-10-CM

## 2022-01-14 DIAGNOSIS — Z932 Ileostomy status: Secondary | ICD-10-CM

## 2022-01-14 DIAGNOSIS — Z7901 Long term (current) use of anticoagulants: Secondary | ICD-10-CM

## 2022-01-14 DIAGNOSIS — G35 Multiple sclerosis: Secondary | ICD-10-CM

## 2022-01-14 LAB — POCT URINALYSIS DIP (PROADVANTAGE DEVICE)
Bilirubin, UA: NEGATIVE
Blood, UA: NEGATIVE
Glucose, UA: NEGATIVE mg/dL
Ketones, POC UA: NEGATIVE mg/dL
Leukocytes, UA: NEGATIVE
Nitrite, UA: NEGATIVE
Protein Ur, POC: NEGATIVE mg/dL
Specific Gravity, Urine: 1.03
Urobilinogen, Ur: NEGATIVE
pH, UA: 6 (ref 5.0–8.0)

## 2022-01-14 NOTE — Telephone Encounter (Signed)
Scheduled appointment per 03/15 los. Left message.  ? ?

## 2022-01-15 ENCOUNTER — Inpatient Hospital Stay: Payer: 59

## 2022-01-15 ENCOUNTER — Other Ambulatory Visit: Payer: Self-pay | Admitting: Internal Medicine

## 2022-01-15 DIAGNOSIS — R7989 Other specified abnormal findings of blood chemistry: Secondary | ICD-10-CM

## 2022-01-19 ENCOUNTER — Other Ambulatory Visit (INDEPENDENT_AMBULATORY_CARE_PROVIDER_SITE_OTHER): Payer: 59

## 2022-01-19 ENCOUNTER — Other Ambulatory Visit: Payer: Self-pay | Admitting: Internal Medicine

## 2022-01-19 DIAGNOSIS — R7989 Other specified abnormal findings of blood chemistry: Secondary | ICD-10-CM

## 2022-01-19 LAB — HEPATIC FUNCTION PANEL
ALT: 98 U/L — ABNORMAL HIGH (ref 0–35)
AST: 54 U/L — ABNORMAL HIGH (ref 0–37)
Albumin: 4.2 g/dL (ref 3.5–5.2)
Alkaline Phosphatase: 111 U/L (ref 39–117)
Bilirubin, Direct: 0.1 mg/dL (ref 0.0–0.3)
Total Bilirubin: 0.4 mg/dL (ref 0.2–1.2)
Total Protein: 7.6 g/dL (ref 6.0–8.3)

## 2022-01-19 NOTE — Progress Notes (Signed)
Lft ? ?

## 2022-01-20 ENCOUNTER — Encounter: Payer: Self-pay | Admitting: Hematology

## 2022-01-20 NOTE — Progress Notes (Signed)
? ? ?HEMATOLOGY/ONCOLOGY CLINIC VISIT NOTE ? ?Date of Service: 01/13/2022 ? ? ?Patient Care Team: ?Rita Ohara, MD as PCP - General (Family Medicine) ?Pieter Partridge, DO as Consulting Physician (Neurology) ?Nunzio Cobbs, MD as Consulting Physician (Obstetrics and Gynecology) ?Brunetta Genera, MD as Consulting Physician (Oncology) ? ?CHIEF COMPLAINTS/PURPOSE OF CONSULTATION:  ?Follow-up for management of iron deficiency ? ?HISTORY OF PRESENTING ILLNESS:   ?Lindsay Mccarthy is a wonderful 46 y.o. female who has been referred to [place] by Dr. Tomi Bamberger, her PCP, for evaluation and management of DVT and PE. ? ?Patient reported a significant h/o Crohns disease and noted that she noticed on 11/26/17 that her b/l thighs were heavy swelling and painful. She initially did not have SOB. She presented to the hospital on 11/27/2017 with a sudden onset of right leg swelling for 2 days. She is also been having some pleuritic chest pain and shortness of breath upon exertion. 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 ? 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 The following day she returned and had AngioJet thrombectomy and balloon venoplasty. She had residual thrombus within her inferior vena cava. Following her procedure, the pain in her right leg had almost completely resolved. Unfortunately, she began developing shortness of breath and was confirmed to have worsening PE by CT angiogram. This required thrombolysis and IVC filter placement.  ? ?She had a superficial blood clot in her left calf in 07/2017. She was not on Cimzia at that time. Blood clot went away on it's own. Red "streaking" of the leg and her the pain went away in 6 weeks.  ?She has been on 364m of aspirin after her superficial blood clot. She was placed on eliquis since her acute DVT and PE.  ? ?She denies family history of blood clots. She  had not been traveling around that time and was being more active. She was on Birth control in the past and blood clots never occurred. She was never pregnant. Her previous stricture resection surgery did not lead to a blood clot.  ? ?She was diagnosed with Crohn's disease when she was on 153 She reviewed her medication for this. She had a stricture resected 12 years ago. She denies having IV iron or blood transfusions. She has not had a period in 8 years. She is currently on Mirena. Prior to did not have menorrhagia. Last colonoscopy was fall 2018.  ? ?On review of symptoms, pt notes she has rarely has GI bleeding. Breathing is back to baseline, only some shortness of breath upon significant exertion.  ? ? ?INTERVAL HISTORY  ? ?PZEENAT JEANBAPTISTEis here for follow-up for management of iron deficiency anemia. ?She notes some chronic fatigue but no significant change in her baseline energy levels recently. ?She last received IV iron dextran infusion on 01/01/2021. ?Labs today showed normal CBC with a hemoglobin of 13 normal WBC count of 9k and slightly elevated platelets of 436k ?CMP shows abnormal liver function tests with AST of 74 ALT of 114 ?Ferritin of 56 and iron saturation of 6% ?Patient has previously had reaction with IV Venofer with fever and flulike symptoms. ?She did tolerate iron dextran and was offered iron dextran infusion again.  She would like to hold off at this time since her ferritin levels are still more than 50 and previously when she had IV iron these  were less than 4. ?Also her blood counts are completely normal at this time. ?She will try to increase her p.o. iron intake. ? ?MEDICAL HISTORY:  ?Past Medical History:  ?Diagnosis Date  ? Anemia   ? related to Crohns flares  ? Arm DVT (deep venous thromboembolism), acute, right ([place]) 11/29/2017  ? Arthritis   ? knees, feet, hands, wrists, related to Crohns flare  ? Asthma   ? Childhood  ? B12 deficiency   ? monitored/treated by Dr. Carlean Purl  ?  Cervical dysplasia   ? Clotting disorder ([place])   ? on eliquis hx dvt/ pulmonary embolism  ? COVID 04/13/2021  ? Crohn disease ([place])   ? Crohn's disease of small intestine ([place]) 06/24/2012  ? Diagnosed 1999, in [place]. Ileitis only then. Treated with Imuran Remicade and prednisone.  Noncompliant with therapy. 2004 return to care and was treated with Remicade prednisone Pentasa Cipro and Flagyl. 2006 status post right hemicolectomy. Subsequently treated with azathioprine and Cimzia. 200 mg every other week.  azathioprine was added in 2010. Prometheus TP MT enzyme was negative.  ? Depression   ? DVT of upper extremity (deep vein thrombosis) ([place]) 11/28/2017  ? Eczema   ? arms and behind knees, worse in winter  ? Generalized anxiety disorder   ? History of recurrent UTI (urinary tract infection)   ? HPV (human papilloma virus) infection   ? Hyperlipemia   ? Hypertriglyceridemia 07/03/2012  ? 10/2010 labs showed level of 753 with total cholesterol 180 and HDL 41' Labs 05/2013--TG 180   ? Major depressive disorder in remission ([place]) 10/03/2013  ? MS (multiple sclerosis) ([place])   ? Multiple sclerosis ([place]) 2019  ? Officially diagnosed in the Fall  ? Neuromuscular disorder ([place])   ? Osteopenia 09/29/2011  ? T-1.6  ? Papanicolaou smear 10/2011  ? last abnormal 2011  ? Pulmonary embolism ([place]) 11/30/2017  ? Scaphoid fracture of wrist 2013  ? left  ? Seasonal allergic rhinitis   ? Shingles 09/2015  ? R hip  ? Vitamin B12 deficiency 06/19/2012  ? Vitamin D deficiency 12/16/2015  ? Vitamin D-OH level 12   ? ? ?SURGICAL HISTORY: ?Past Surgical History:  ?Procedure Laterality Date  ? APPENDECTOMY    ? CERVICAL BIOPSY  W/ LOOP ELECTRODE EXCISION  11/02/2007  ? ---paps normal since  ? COLONOSCOPY  multiple  ? scanned  ? ESOPHAGOGASTRODUODENOSCOPY  multiple  ? scanned  ? HEMICOLECTOMY  11/01/2004  ? ILEOCECETOMY  12/11/2021  ? and end ileostomy; at [facility]  ? IR ANGIOGRAM PULMONARY BILATERAL SELECTIVE  12/03/2017  ? IR ANGIOGRAM  SELECTIVE EACH ADDITIONAL VESSEL  12/03/2017  ? IR ANGIOGRAM SELECTIVE EACH ADDITIONAL VESSEL  12/03/2017  ? IR INFUSION THROMBOL ARTERIAL INITIAL (MS)  12/03/2017  ? IR INFUSION THROMBOL ARTERIAL INITIAL (MS)  12/03/2017  ? IR IVC FILTER PLMT / S&I /IMG GUID/MOD SED  12/04/2017  ? IR THROMB F/U EVAL ART/VEN FINAL DAY (MS)  12/04/2017  ? IR US GUIDE VASC ACCESS RIGHT  12/03/2017  ? IVC FILTER REMOVAL N/A 07/11/2018  ? Procedure: IVC FILTER REMOVAL;  Surgeon: Serafina Mitchell, MD;  Location: [facility];  Service: Cardiovascular;  Laterality: N/A;  ? IVC VENOGRAPHY N/A 07/11/2018  ? Procedure: IVC Venography;  Surgeon: Serafina Mitchell, MD;  Location: [facility];  Service: Cardiovascular;  Laterality: N/A;  ? LEEP  11/01/2009  ? --done in [place]  ? PERIPHERAL VASCULAR BALLOON ANGIOPLASTY Right 11/30/2017  ? Procedure: PERIPHERAL VASCULAR  BALLOON ANGIOPLASTY;  Surgeon: Serafina Mitchell, MD;  Location: [facility];  Service: Cardiovascular;  Laterality: Right;  ? PERIPHERAL VASCULAR THROMBECTOMY N/A 11/29/2017  ? Procedure: PERIPHERAL VASCULAR THROMBECTOMY - THROMBOLYSIS;  Surgeon: Serafina Mitchell, MD;  Location: [facility];  Service: Cardiovascular;  Laterality: N/A;  LYSIS CATHETER PLACEMENT ONLY  ? PERIPHERAL VASCULAR THROMBECTOMY N/A 11/30/2017  ? Procedure: PERIPHERAL VASCULAR THROMBECTOMY - Lysis Recheck;  Surgeon: Serafina Mitchell, MD;  Location: [facility];  Service: Cardiovascular;  Laterality: N/A;  ? WISDOM TOOTH EXTRACTION    ? ? ?SOCIAL HISTORY: ?Social History  ? ?Socioeconomic History  ? Marital status: Married  ?  Spouse name: Lelan Pons  ? Number of children: 0  ? Years of education: 7  ? Highest education level: Associate degree: occupational, Hotel manager, or vocational program  ?Occupational History  ? Occupation: Teacher, early years/pre  ?  Employer: [employer]  ?Tobacco Use  ? Smoking status: Former  ?  Packs/day: 1.00  ?  Years: 4.00  ?  Pack years: 4.00  ?   Types: Cigarettes  ?  Quit date: 11/18/1998  ?  Years since quitting: 23.1  ? Smokeless tobacco: Never  ?Vaping Use  ? Vaping Use: Never used  ?Substance and Sexual Activity  ? Alcohol use: Not Currently  ? Drug

## 2022-01-27 ENCOUNTER — Other Ambulatory Visit: Payer: Self-pay | Admitting: Neurology

## 2022-01-28 ENCOUNTER — Encounter: Payer: Self-pay | Admitting: Internal Medicine

## 2022-01-29 ENCOUNTER — Other Ambulatory Visit (INDEPENDENT_AMBULATORY_CARE_PROVIDER_SITE_OTHER): Payer: 59

## 2022-01-29 DIAGNOSIS — R7989 Other specified abnormal findings of blood chemistry: Secondary | ICD-10-CM

## 2022-01-29 LAB — HEPATIC FUNCTION PANEL
ALT: 108 U/L — ABNORMAL HIGH (ref 0–35)
AST: 63 U/L — ABNORMAL HIGH (ref 0–37)
Albumin: 4.4 g/dL (ref 3.5–5.2)
Alkaline Phosphatase: 126 U/L — ABNORMAL HIGH (ref 39–117)
Bilirubin, Direct: 0 mg/dL (ref 0.0–0.3)
Total Bilirubin: 0.3 mg/dL (ref 0.2–1.2)
Total Protein: 7.9 g/dL (ref 6.0–8.3)

## 2022-02-01 ENCOUNTER — Encounter: Payer: Self-pay | Admitting: Internal Medicine

## 2022-02-02 ENCOUNTER — Other Ambulatory Visit: Payer: Self-pay

## 2022-02-02 DIAGNOSIS — R7989 Other specified abnormal findings of blood chemistry: Secondary | ICD-10-CM

## 2022-02-02 NOTE — Telephone Encounter (Signed)
Orders for labs placed in Epic: Pt made aware ?RUQ [place] ordered and scheduled: on 02/09/2022 at 8:00 at [facility]: Pt to arrive at 7:30 AM : Nothing to eat or drink past midnight: Pt made aware ?Pt stated that she will complete the labs the day of her [place] since she will be in [place] that same day: ?Pt verbalized understanding with all questions answered.  ? ?

## 2022-02-09 ENCOUNTER — Other Ambulatory Visit (INDEPENDENT_AMBULATORY_CARE_PROVIDER_SITE_OTHER): Payer: 59

## 2022-02-09 ENCOUNTER — Ambulatory Visit (HOSPITAL_COMMUNITY)
Admission: RE | Admit: 2022-02-09 | Discharge: 2022-02-09 | Disposition: A | Discharge status: Home / Self Care | Payer: 59 | Source: Ambulatory Visit | Attending: Internal Medicine | Admitting: Internal Medicine

## 2022-02-09 DIAGNOSIS — R7989 Other specified abnormal findings of blood chemistry: Secondary | ICD-10-CM | POA: Diagnosis present

## 2022-02-09 LAB — GAMMA GT: GGT: 125 U/L — ABNORMAL HIGH (ref 7–51)

## 2022-02-16 LAB — HEPATITIS PANEL, ACUTE
Hep A IgM: NONREACTIVE
Hep B C IgM: NONREACTIVE
Hepatitis B Surface Ag: NONREACTIVE
Hepatitis C Ab: NONREACTIVE
SIGNAL TO CUT-OFF: 0.04 (ref <1.00)

## 2022-02-16 LAB — CK TOTAL AND CKMB (NOT AT ARMC)
CK, MB: <0.7 ng/mL (ref 0–5.0)
Total CK: 63 U/L (ref 29–143)

## 2022-02-16 LAB — NUCLEOTIDASE, 5', BLOOD: 5-Nucleotidase: 3 U/L (ref 0–10)

## 2022-02-16 LAB — MITOCHONDRIAL ANTIBODIES: Mitochondrial M2 Ab, IgG: <=20 U (ref <=20.0)

## 2022-02-16 LAB — ANTI-SMOOTH MUSCLE ANTIBODY, IGG: Actin (Smooth Muscle) Antibody (IGG): <20 U (ref <20)

## 2022-02-16 LAB — ALPHA-1-ANTITRYPSIN: A-1 Antitrypsin, Ser: 124 mg/dL (ref 83–199)

## 2022-02-16 LAB — ANA: Anti Nuclear Antibody (ANA): NEGATIVE

## 2022-02-18 ENCOUNTER — Other Ambulatory Visit: Payer: Self-pay | Admitting: Family Medicine

## 2022-02-18 DIAGNOSIS — F325 Major depressive disorder, single episode, in full remission: Secondary | ICD-10-CM

## 2022-02-22 ENCOUNTER — Telehealth: Payer: Self-pay | Admitting: Internal Medicine

## 2022-02-22 NOTE — Telephone Encounter (Signed)
Spoke to [place] after I received orders to start Stelara ?She and I will discuss next steps when she returns in may - have not decided on next biologic agent ? ?I communicated this to [facility] also ?Benson Norway Pharm D - spoke to him ? ? ?

## 2022-03-16 ENCOUNTER — Other Ambulatory Visit: Payer: Self-pay | Admitting: Family Medicine

## 2022-03-16 ENCOUNTER — Other Ambulatory Visit: Payer: 59

## 2022-03-16 DIAGNOSIS — M62838 Other muscle spasm: Secondary | ICD-10-CM

## 2022-03-16 DIAGNOSIS — E781 Pure hyperglyceridemia: Secondary | ICD-10-CM

## 2022-03-16 LAB — VITAMIN D 25 HYDROXY (VIT D DEFICIENCY, FRACTURES): VITD: 35.66 ng/mL (ref 30.00–100.00)

## 2022-03-19 ENCOUNTER — Other Ambulatory Visit (INDEPENDENT_AMBULATORY_CARE_PROVIDER_SITE_OTHER): Payer: 59

## 2022-03-19 ENCOUNTER — Encounter: Payer: Self-pay | Admitting: Internal Medicine

## 2022-03-19 ENCOUNTER — Ambulatory Visit (INDEPENDENT_AMBULATORY_CARE_PROVIDER_SITE_OTHER): Payer: 59 | Admitting: Internal Medicine

## 2022-03-19 VITALS — BP 134/68 | HR 74 | Ht 64.0 in | Wt 210.0 lb

## 2022-03-19 DIAGNOSIS — D509 Iron deficiency anemia, unspecified: Secondary | ICD-10-CM

## 2022-03-19 DIAGNOSIS — K760 Fatty (change of) liver, not elsewhere classified: Secondary | ICD-10-CM

## 2022-03-19 DIAGNOSIS — K50012 Crohn's disease of small intestine with intestinal obstruction: Secondary | ICD-10-CM

## 2022-03-19 DIAGNOSIS — R7989 Other specified abnormal findings of blood chemistry: Secondary | ICD-10-CM

## 2022-03-19 DIAGNOSIS — G35 Multiple sclerosis: Secondary | ICD-10-CM

## 2022-03-19 DIAGNOSIS — Z932 Ileostomy status: Secondary | ICD-10-CM

## 2022-03-19 LAB — COMPREHENSIVE METABOLIC PANEL
ALT: 49 U/L — ABNORMAL HIGH (ref 0–35)
AST: 39 U/L — ABNORMAL HIGH (ref 0–37)
Albumin: 4.6 g/dL (ref 3.5–5.2)
Alkaline Phosphatase: 102 U/L (ref 39–117)
BUN: 18 mg/dL (ref 6–23)
CO2: 25 mEq/L (ref 19–32)
Calcium: 9.9 mg/dL (ref 8.4–10.5)
Chloride: 100 mEq/L (ref 96–112)
Creatinine, Ser: 1.1 mg/dL (ref 0.40–1.20)
GFR: 61.26 mL/min (ref >60.00)
Glucose, Bld: 75 mg/dL (ref 70–99)
Potassium: 4.7 mEq/L (ref 3.5–5.1)
Sodium: 134 mEq/L — ABNORMAL LOW (ref 135–145)
Total Bilirubin: 0.4 mg/dL (ref 0.2–1.2)
Total Protein: 8.5 g/dL — ABNORMAL HIGH (ref 6.0–8.3)

## 2022-03-19 LAB — CBC WITH DIFFERENTIAL/PLATELET
Basophils Absolute: 0.1 10*3/uL (ref 0.0–0.1)
Basophils Relative: 0.5 % (ref 0.0–3.0)
Eosinophils Absolute: 0.1 10*3/uL (ref 0.0–0.7)
Eosinophils Relative: 1.4 % (ref 0.0–5.0)
HCT: 45.2 % (ref 36.0–46.0)
Hemoglobin: 15.1 g/dL — ABNORMAL HIGH (ref 12.0–15.0)
Lymphocytes Relative: 11.9 % — ABNORMAL LOW (ref 12.0–46.0)
Lymphs Abs: 1.2 10*3/uL (ref 0.7–4.0)
MCHC: 33.5 g/dL (ref 30.0–36.0)
MCV: 85 fl (ref 78.0–100.0)
Monocytes Absolute: 0.8 10*3/uL (ref 0.1–1.0)
Monocytes Relative: 8 % (ref 3.0–12.0)
Neutro Abs: 8.2 10*3/uL — ABNORMAL HIGH (ref 1.4–7.7)
Neutrophils Relative %: 78.2 % — ABNORMAL HIGH (ref 43.0–77.0)
Platelets: 419 10*3/uL — ABNORMAL HIGH (ref 150.0–400.0)
RBC: 5.32 Mil/uL — ABNORMAL HIGH (ref 3.87–5.11)
RDW: 15.9 % — ABNORMAL HIGH (ref 11.5–15.5)
WBC: 10.5 10*3/uL (ref 4.0–10.5)

## 2022-03-19 LAB — FERRITIN: Ferritin: 20.8 ng/mL (ref 10.0–291.0)

## 2022-03-19 LAB — IBC + FERRITIN
Ferritin: 23.1 ng/mL (ref 10.0–291.0)
Iron: 127 ug/dL (ref 42–145)
Saturation Ratios: 18.2 % — ABNORMAL LOW (ref 20.0–50.0)
TIBC: 697.2 ug/dL — ABNORMAL HIGH (ref 250.0–450.0)
Transferrin: 498 mg/dL — ABNORMAL HIGH (ref 212.0–360.0)

## 2022-03-19 LAB — VITAMIN B12: Vitamin B-12: 497 pg/mL (ref 211–911)

## 2022-03-19 NOTE — Progress Notes (Signed)
Lindsay Mccarthy 46 y.o. [birth year] [id number]  Assessment & Plan:   Encounter Diagnoses  Name Primary?   Abnormal LFTs Yes   Hepatic steatosis    Crohn's disease of small intestine with intestinal obstruction (HCC)    Multiple sclerosis (HCC)    Iron deficiency anemia, unspecified iron deficiency anemia type    Ileostomy in place [facility])     I suspect hepatic steatosis/nonalcoholic fatty liver disease vs post-op issues (inflammation) or both as a cause of the abnormal liver tests.  I have repeated them and they are improved so we will likely continue follow-up. She is showing signs of dehydration in the CBC -Hgb 15.1 - perhaps. Increase hydration post-ileostomy   Ileoscopy is planned for August we will need to call her to schedule that when the August schedule comes out.  Consider checking fecal calprotectin at time of colonoscopy  Thinking of using Stelara as next biologic agent - she is a higher risk patient so think we should do so - ? Timing pre or post take down. Also consider returning to immunomodulator tx  She will decide whether to keep or take down ileostomy (Currently planned for Sept takedown) - I think she is likely to have sig need for bile salt sequestrant which bothers her (bloating)  Reviewed - briefly - low carb diet and handouts about that and NAFLD provided   Lab Results  Component Value Date   ALT 49 (H) 03/19/2022   AST 39 (H) 03/19/2022   ALKPHOS 102 03/19/2022   BILITOT 0.4 03/19/2022    Lab Results  Component Value Date   CREATININE 1.10 03/19/2022   BUN 18 03/19/2022   NA 134 (L) 03/19/2022   K 4.7 03/19/2022   CL 100 03/19/2022   CO2 25 03/19/2022    Lab Results  Component Value Date   WBC 10.5 03/19/2022   HGB 15.1 (H) 03/19/2022   HCT 45.2 03/19/2022   MCV 85.0 03/19/2022   PLT 419.0 (H) 03/19/2022   Lab Results  Component Value Date   IRON 127 03/19/2022   TIBC 697.2 (H) 03/19/2022   FERRITIN 23.1 03/19/2022      Subjective:   Chief Complaint: crohn's f/u  HPI [facility] M[facility] is here with her husband, she has Crohn's disease of the small bowel and is now status post her second surgery (Dr. Drue Mccarthy) and feels much better.  She is quite pleased with an ileostomy and may keep it.  She has been treated with Cimzia, I think also Remicade in the past and more recently Entyvio but has had disease progression on all of those.  Has been on azathioprine in the past as well.  Originally had a right hemicolectomy distal small bowel resection years ago.  She was changed to [facility] in 2018 due to Cimzia failure she had an inflammatory ileocolonic anastomotic stricture.  She did well for a number of years but then started having problems again and was referred to Dr. Drue Mccarthy for surgery.  She also saw Dr. Koleen [facility] at [facility].  In February she had an open ileocecectomy with end ileostomy for her fibrostenotic disease.  Pathology showed inflammation in the small bowel and microscopic changes in the colon (she has never had endoscopic colonic disease that I am aware of.  She is currently off therapy.  Planning a trip to [place] in late August.  Off her MS medication at this time.  Additional issue is that she has had abnormal LFTs and imaging shows Allergies  Allergen Reactions  Iron Other (See Comments)    Blood in stool/Rectal Bleeding   Iron Sucrose Other (See Comments)    Fever, infusion reaction  Flu like symptoms   Ibuprofen Other (See Comments)    Avoids due to Crohns disease   Morphine And Related Itching    itchy   Ofatumumab Other (See Comments)    Crohn's flare   Adhesive [Tape] Rash    Rash with electrodes   Prednisone Other (See Comments) and Anxiety    "crazy", hallucinations Tolerates methylprednisolone hallucinations   Current Meds  Medication Sig   acetaminophen (TYLENOL) 500 MG tablet Take 1,000 mg by mouth every 6 (six) hours as needed for headache (pain). Reported on 12/15/2015   baclofen  (LIORESAL) 10 MG tablet TAKE 1 TABLET(10 MG) BY MOUTH THREE TIMES DAILY AS NEEDED FOR MUSCLE SPASMS   buPROPion ER (WELLBUTRIN SR) 100 MG 12 hr tablet TAKE 1 TABLET BY MOUTH TWICE  DAILY   cetirizine (ZYRTEC) 10 MG tablet Take 10 mg by mouth at bedtime.    CHOLECALCIFEROL PO Take 5,000 Units by mouth 2 (two) times daily.   dalfampridine 10 MG TB12 TAKE 1 TABLET BY MOUTH  EVERY 12 HOURS   DODEX 1000 MCG/ML injection INJECT 1 ML INTO THE SKIN EVERY 14 DAYS   ELIQUIS 5 MG TABS tablet TAKE 1 TABLET(5 MG) BY MOUTH TWICE DAILY   escitalopram (LEXAPRO) 10 MG tablet TAKE 1 TABLET BY MOUTH AT  BEDTIME   fenofibrate 160 MG tablet TAKE 1 TABLET BY MOUTH AT  BEDTIME   gabapentin (NEURONTIN) 100 MG capsule TAKE 3 CAPSULES(300 MG) BY MOUTH AT BEDTIME   Insulin Syringe 27G X 1/2" 1 ML MISC 1 each by Does not apply route every 14 (fourteen) days. For [facility] B12 injection   levonorgestrel (MIRENA) 20 MCG/24HR IUD 1 each by Intrauterine route once. Implanted April 2017   NON FORMULARY Take 1 each by mouth at bedtime. CBD/ cannabis   Past Medical History:  Diagnosis Date   Anemia    related to Crohns flares   Arm DVT (deep venous thromboembolism), acute, right ([facility]) 11/29/2017   Arthritis    knees, feet, hands, wrists, related to Crohns flare   Asthma    Childhood   B12 deficiency    monitored/treated by Dr. Carlean Mccarthy   Cervical dysplasia    Clotting disorder ([facility])    on eliquis hx dvt/ pulmonary embolism   COVID 04/13/2021   Crohn disease ([place])    Crohn's disease of small intestine ([place]) 06/24/2012   Diagnosed 1999, in [place]. Ileitis only then. Treated with Imuran Remicade and prednisone.  Noncompliant with therapy. 2004 return to care and was treated with Remicade prednisone Pentasa Cipro and Flagyl. 2006 status post right hemicolectomy. Subsequently treated with azathioprine and Cimzia. 200 mg every other week.  azathioprine was added in 2010. Prometheus TP MT enzyme was negative.   Depression    DVT  of upper extremity (deep vein thrombosis) ([facility]) 11/28/2017   Eczema    arms and behind knees, worse in winter   Generalized anxiety disorder    History of recurrent UTI (urinary tract infection)    HPV (human papilloma virus) infection    Hyperlipemia    Hypertriglyceridemia 07/03/2012   10/2010 labs showed level of 753 with total cholesterol 180 and HDL 41' Labs 05/2013--TG 180    Major depressive disorder in remission ([place]) 10/03/2013   MS (multiple sclerosis) ([place])    Multiple sclerosis ([place]) 2019   Officially diagnosed in  the Fall   Neuromuscular disorder ([place])    Osteopenia 09/29/2011   T-1.6   Papanicolaou smear 10/2011   last abnormal 2011   Pulmonary embolism ([place]) 11/30/2017   Scaphoid fracture of wrist 2013   left   Seasonal allergic rhinitis    Shingles 09/2015   R hip   Vitamin B12 deficiency 06/19/2012   Vitamin D deficiency 12/16/2015   Vitamin D-OH level 12    Past Surgical History:  Procedure Laterality Date   APPENDECTOMY     CERVICAL BIOPSY  W/ LOOP ELECTRODE EXCISION  11/02/2007   ---paps normal since   COLONOSCOPY  multiple   scanned   ESOPHAGOGASTRODUODENOSCOPY  multiple   scanned   HEMICOLECTOMY  11/01/2004   ILEOCECETOMY  12/11/2021   and end ileostomy; at [facility]  12/03/2017   IR ANGIOGRAM SELECTIVE EACH ADDITIONAL VESSEL  12/03/2017   IR ANGIOGRAM SELECTIVE EACH ADDITIONAL VESSEL  12/03/2017   IR INFUSION THROMBOL ARTERIAL INITIAL (MS)  12/03/2017   IR INFUSION THROMBOL ARTERIAL INITIAL (MS)  12/03/2017   IR IVC FILTER PLMT / S&I /IMG GUID/MOD SED  12/04/2017   IR THROMB F/U EVAL ART/VEN FINAL DAY (MS)  12/04/2017   IR US GUIDE VASC ACCESS RIGHT  12/03/2017   IVC FILTER REMOVAL N/A 07/11/2018   Procedure: IVC FILTER REMOVAL;  Surgeon: Serafina Mitchell, MD;  Location: [facility];  Service: Cardiovascular;  Laterality: N/A;   IVC VENOGRAPHY N/A 07/11/2018   Procedure: IVC Venography;  Surgeon:  Serafina Mitchell, MD;  Location: [facility];  Service: Cardiovascular;  Laterality: N/A;   LEEP  11/01/2009   --done in [place] Right 11/30/2017   Procedure: PERIPHERAL VASCULAR BALLOON ANGIOPLASTY;  Surgeon: Serafina Mitchell, MD;  Location: [facility];  Service: Cardiovascular;  Laterality: Right;   PERIPHERAL VASCULAR THROMBECTOMY N/A 11/29/2017   Procedure: PERIPHERAL VASCULAR THROMBECTOMY - THROMBOLYSIS;  Surgeon: Serafina Mitchell, MD;  Location: [facility];  Service: Cardiovascular;  Laterality: N/A;  LYSIS CATHETER PLACEMENT ONLY   PERIPHERAL VASCULAR THROMBECTOMY N/A 11/30/2017   Procedure: PERIPHERAL VASCULAR THROMBECTOMY - Lysis Recheck;  Surgeon: Serafina Mitchell, MD;  Location: [facility];  Service: Cardiovascular;  Laterality: N/A;   WISDOM TOOTH EXTRACTION     Social History   Social History Narrative   The patient is divorced.     Re-married Marcello Moores, partner of 10 years, on 10/10/16. 2 dogs, 1 cat   No children - doesn't want any   Curator for [employer].   Moved from [place] to [place] in 2013.   Past smoker   No alcohol   2-3 caffeinated beverages a day   She reports she is compliant with sunscreen given her increased risk of sun damage and skin cancer (no longer on azathioprine)      Updated 12/31/21   family history includes Alcohol abuse in her sister; Bone cancer in her mother; Breast cancer (age of onset: 16) in her mother; Colon cancer in her paternal grandfather; Colon polyps in her father; Depression in her mother; Diabetes in her father; Fuch's dystrophy in her father and sister; Heart disease in her paternal grandmother and sister; Hyperlipidemia in her father; Hypertension in her father, maternal grandmother, mother, paternal grandmother, and sister; Hypothyroidism in her mother; Lung cancer in her maternal grandmother; Multiple sclerosis in her sister; Stroke in  her maternal grandmother.  Review of Systems As above  Objective:   Physical Exam @BP  134/68   Pulse 74   Ht 5' 4"  (1.626 m)   Wt 210 lb (95.3 kg)   BMI 36.05 kg/m @  General:  NAD obese Eyes:   anicteric Lungs:  clear Heart::  S1S2 no rubs, murmurs or gallops Abdomen:  soft and nontender, BS+ ileostomy bag RUQ - midline scar Ext:   no edema, cyanosis or clubbing    Data Reviewed:  Surgery notes and labs [facility]

## 2022-03-19 NOTE — Patient Instructions (Addendum)
Your provider has requested that you go to the basement level for lab work before leaving today. Press "B" on the elevator. The lab is located at the first door on the left as you exit the elevator.  Due to recent changes in healthcare laws, you may see the results of your imaging and laboratory studies on MyChart before your provider has had a chance to review them.  We understand that in some cases there may be results that are confusing or concerning to you. Not all laboratory results come back in the same time frame and the provider may be waiting for multiple results in order to interpret others.  Please give [place] 48 hours in order for your provider to thoroughly review all the results before contacting the office for clarification of your results.   We will contact you about setting up a procedure in August once our schedule comes out.    I appreciate the opportunity to care for you. Silvano Rusk, MD, [facility]

## 2022-03-22 ENCOUNTER — Encounter: Payer: Self-pay | Admitting: Internal Medicine

## 2022-03-29 ENCOUNTER — Other Ambulatory Visit: Payer: Self-pay | Admitting: Family Medicine

## 2022-03-29 DIAGNOSIS — F325 Major depressive disorder, single episode, in full remission: Secondary | ICD-10-CM

## 2022-03-30 NOTE — Telephone Encounter (Signed)
Pt has an appt in fall 2023

## 2022-04-01 ENCOUNTER — Telehealth: Payer: Self-pay

## 2022-04-01 DIAGNOSIS — K50012 Crohn's disease of small intestine with intestinal obstruction: Secondary | ICD-10-CM

## 2022-04-01 DIAGNOSIS — Z932 Ileostomy status: Secondary | ICD-10-CM

## 2022-04-01 NOTE — Telephone Encounter (Signed)
[facility] has booked 06/07/2022 at 9:00am for her colonoscopy. I told her we will be back in touch with her instructions. Will route to Dr Carlean Purl to advise.

## 2022-04-01 NOTE — Telephone Encounter (Signed)
Have her do clear liqs day before and 32 oz MiraLAx + Gatorade the night before - 7-9 PM  (This is half a MiraLAx prep and no dulcolax)

## 2022-04-02 NOTE — Telephone Encounter (Signed)
I informed [facility] that I am mailing her instructions out. She ask me if Dr Carlean Purl had said anything about when she would be starting the Stelara. I will find out.

## 2022-04-05 ENCOUNTER — Telehealth: Payer: Self-pay

## 2022-04-05 NOTE — Telephone Encounter (Signed)
I want to see the results of the procedure first

## 2022-04-05 NOTE — Telephone Encounter (Signed)
Dear Dr Irene Limbo:  We have scheduled the above named patient for a(n) colonoscopy procedure. Our records show that she is on anticoagulation therapy.  Please advise as to whether the patient may come off their therapy of Eliquis the day prior to procedure and day of her procedure  which is scheduled for 06/07/2022.  Please route your response to Kelsey Durflinger [place], [place] or fax response to [phone]. Thank you.

## 2022-04-06 NOTE — Telephone Encounter (Signed)
I left Trish a detailed message with Dr Celesta Aver message in regards to the [place]. I also told her that I am working on getting Eliquis clearance for her upcoming procedure.

## 2022-04-15 ENCOUNTER — Other Ambulatory Visit: Payer: Self-pay | Admitting: Hematology

## 2022-04-15 DIAGNOSIS — E538 Deficiency of other specified B group vitamins: Secondary | ICD-10-CM

## 2022-04-15 DIAGNOSIS — D509 Iron deficiency anemia, unspecified: Secondary | ICD-10-CM

## 2022-04-16 ENCOUNTER — Inpatient Hospital Stay: Payer: 59 | Attending: Hematology

## 2022-04-16 ENCOUNTER — Other Ambulatory Visit: Payer: Self-pay

## 2022-04-16 DIAGNOSIS — E538 Deficiency of other specified B group vitamins: Secondary | ICD-10-CM | POA: Insufficient documentation

## 2022-04-16 DIAGNOSIS — K5090 Crohn's disease, unspecified, without complications: Secondary | ICD-10-CM | POA: Diagnosis not present

## 2022-04-16 DIAGNOSIS — D509 Iron deficiency anemia, unspecified: Secondary | ICD-10-CM

## 2022-04-16 LAB — CBC WITH DIFFERENTIAL (CANCER CENTER ONLY)
Abs Immature Granulocytes: 0.02 10*3/uL (ref 0.00–0.07)
Basophils Absolute: 0.1 10*3/uL (ref 0.0–0.1)
Basophils Relative: 1 %
Eosinophils Absolute: 0.2 10*3/uL (ref 0.0–0.5)
Eosinophils Relative: 2 %
HCT: 43.3 % (ref 36.0–46.0)
Hemoglobin: 14.7 g/dL (ref 12.0–15.0)
Immature Granulocytes: 0 %
Lymphocytes Relative: 12 %
Lymphs Abs: 1.2 10*3/uL (ref 0.7–4.0)
MCH: 29.1 pg (ref 26.0–34.0)
MCHC: 33.9 g/dL (ref 30.0–36.0)
MCV: 85.7 fL (ref 80.0–100.0)
Monocytes Absolute: 0.9 10*3/uL (ref 0.1–1.0)
Monocytes Relative: 9 %
Neutro Abs: 7.5 10*3/uL (ref 1.7–7.7)
Neutrophils Relative %: 76 %
Platelet Count: 382 10*3/uL (ref 150–400)
RBC: 5.05 MIL/uL (ref 3.87–5.11)
RDW: 14.9 % (ref 11.5–15.5)
WBC Count: 9.8 10*3/uL (ref 4.0–10.5)
nRBC: 0 % (ref 0.0–0.2)

## 2022-04-16 LAB — CMP (CANCER CENTER ONLY)
ALT: 88 U/L — ABNORMAL HIGH (ref 0–44)
AST: 45 U/L — ABNORMAL HIGH (ref 15–41)
Albumin: 4.2 g/dL (ref 3.5–5.0)
Alkaline Phosphatase: 126 U/L (ref 38–126)
Anion gap: 8 (ref 5–15)
BUN: 26 mg/dL — ABNORMAL HIGH (ref 6–20)
CO2: 25 mmol/L (ref 22–32)
Calcium: 9.5 mg/dL (ref 8.9–10.3)
Chloride: 103 mmol/L (ref 98–111)
Creatinine: 1.02 mg/dL — ABNORMAL HIGH (ref 0.44–1.00)
GFR, Estimated: >60 mL/min (ref >60)
Glucose, Bld: 97 mg/dL (ref 70–99)
Potassium: 4.4 mmol/L (ref 3.5–5.1)
Sodium: 136 mmol/L (ref 135–145)
Total Bilirubin: 0.4 mg/dL (ref 0.3–1.2)
Total Protein: 7.8 g/dL (ref 6.5–8.1)

## 2022-04-16 LAB — VITAMIN B12: Vitamin B-12: 478 pg/mL (ref 180–914)

## 2022-04-16 LAB — IRON AND IRON BINDING CAPACITY (CC-WL,HP ONLY)
Iron: 95 ug/dL (ref 28–170)
Saturation Ratios: 14 % (ref 10.4–31.8)
TIBC: 676 ug/dL — ABNORMAL HIGH (ref 250–450)
UIBC: 581 ug/dL — ABNORMAL HIGH (ref 148–442)

## 2022-04-16 LAB — FERRITIN: Ferritin: 20 ng/mL (ref 11–307)

## 2022-04-19 ENCOUNTER — Encounter: Payer: Self-pay | Admitting: Hematology

## 2022-04-19 ENCOUNTER — Inpatient Hospital Stay (HOSPITAL_BASED_OUTPATIENT_CLINIC_OR_DEPARTMENT_OTHER): Payer: 59 | Admitting: Hematology

## 2022-04-19 DIAGNOSIS — E538 Deficiency of other specified B group vitamins: Secondary | ICD-10-CM | POA: Diagnosis not present

## 2022-04-19 DIAGNOSIS — D509 Iron deficiency anemia, unspecified: Secondary | ICD-10-CM | POA: Diagnosis not present

## 2022-04-19 NOTE — Progress Notes (Incomplete)
HEMATOLOGY/ONCOLOGY PHONE VISIT NOTE  Date of Service: 04/19/2022   Patient Care Team: Rita Ohara, MD as PCP - General (Family Medicine) Pieter Partridge, DO as Consulting Physician (Neurology) Yisroel Ramming, Everardo All, MD as Consulting Physician (Obstetrics and Gynecology) Brunetta Genera, MD as Consulting Physician (Oncology)  CHIEF COMPLAINTS/PURPOSE OF CONSULTATION:  Follow-up for management of iron deficiency  HISTORY OF PRESENTING ILLNESS:   Please see previous note for details on initial presentation  INTERVAL HISTORY  I connected with Lindsay Mccarthy on 04/19/2022 at 1:30 PM EST by telephone visit and verified that I am speaking with the correct person using two identifiers.   I discussed the limitations, risks, security and privacy concerns of performing an evaluation and management service by telemedicine and the availability of in-person appointments. I also discussed with the patient that there may be a patient responsible charge related to this service. The patient expressed understanding and agreed to proceed.   Other persons participating in the visit and their role in the encounter: None  Patient's location: Home Provider's location: [place] is a 46 y.o. female who was contacted via phone for follow-up for management of iron deficiency anemia. She reports She is doing well with no new symptoms or concerns.  She is taking iron polysaccharide 125 mg 1x p.o daily and tolerating it well. We discussed that since she is tolerating it well that she could increase the dosage to 125 mg 2x p.o daily which she was agreeable to.  She reports that her Crohn's is not currently active.  She notes some chronic fatigue but no significant change in her baseline energy levels recently.  She last received IV iron dextran infusion on 01/14/2022.  Labs done today were reviewed in detail.  MEDICAL HISTORY:  Past Medical History:   Diagnosis Date   Anemia    related to Crohns flares   Arm DVT (deep venous thromboembolism), acute, right ([place]) 11/29/2017   Arthritis    knees, feet, hands, wrists, related to Crohns flare   Asthma    Childhood   B12 deficiency    monitored/treated by Dr. Carlean Purl   Cervical dysplasia    Clotting disorder ([place])    on eliquis hx dvt/ pulmonary embolism   COVID 04/13/2021   Crohn disease ([place])    Crohn's disease of small intestine ([place]) 06/24/2012   Diagnosed 1999, in [place]. Ileitis only then. Treated with Imuran Remicade and prednisone.  Noncompliant with therapy. 2004 return to care and was treated with Remicade prednisone Pentasa Cipro and Flagyl. 2006 status post right hemicolectomy. Subsequently treated with azathioprine and Cimzia. 200 mg every other week.  azathioprine was added in 2010. Prometheus TP MT enzyme was negative.   Depression    DVT of upper extremity (deep vein thrombosis) ([place]) 11/28/2017   Eczema    arms and behind knees, worse in winter   Generalized anxiety disorder    History of recurrent UTI (urinary tract infection)    HPV (human papilloma virus) infection    Hyperlipemia    Hypertriglyceridemia 07/03/2012   10/2010 labs showed level of 753 with total cholesterol 180 and HDL 41' Labs 05/2013--TG 180    Major depressive disorder in remission ([place]) 10/03/2013   MS (multiple sclerosis) ([place])    Multiple sclerosis ([place]) 2019   Officially diagnosed in the Fall   Neuromuscular disorder ([place])    Osteopenia 09/29/2011   T-1.6   Papanicolaou smear 10/2011  last abnormal 2011   Pulmonary embolism ([place]) 11/30/2017   Scaphoid fracture of wrist 2013   left   Seasonal allergic rhinitis    Shingles 09/2015   R hip   Vitamin B12 deficiency 06/19/2012   Vitamin D deficiency 12/16/2015   Vitamin D-OH level 12     SURGICAL HISTORY: Past Surgical History:  Procedure Laterality Date   APPENDECTOMY     CERVICAL BIOPSY  W/ LOOP ELECTRODE EXCISION  11/02/2007    ---paps normal since   COLONOSCOPY  multiple   scanned   ESOPHAGOGASTRODUODENOSCOPY  multiple   scanned   HEMICOLECTOMY  11/01/2004   ILEOCECETOMY  12/11/2021   and end ileostomy; at [facility]  12/03/2017   IR ANGIOGRAM SELECTIVE EACH ADDITIONAL VESSEL  12/03/2017   IR ANGIOGRAM SELECTIVE EACH ADDITIONAL VESSEL  12/03/2017   IR INFUSION THROMBOL ARTERIAL INITIAL (MS)  12/03/2017   IR INFUSION THROMBOL ARTERIAL INITIAL (MS)  12/03/2017   IR IVC FILTER PLMT / S&I /IMG GUID/MOD SED  12/04/2017   IR THROMB F/U EVAL ART/VEN FINAL DAY (MS)  12/04/2017   IR US GUIDE VASC ACCESS RIGHT  12/03/2017   IVC FILTER REMOVAL N/A 07/11/2018   Procedure: IVC FILTER REMOVAL;  Surgeon: Serafina Mitchell, MD;  Location: [facility];  Service: Cardiovascular;  Laterality: N/A;   IVC VENOGRAPHY N/A 07/11/2018   Procedure: IVC Venography;  Surgeon: Serafina Mitchell, MD;  Location: [facility];  Service: Cardiovascular;  Laterality: N/A;   LEEP  11/01/2009   --done in [place] Right 11/30/2017   Procedure: PERIPHERAL VASCULAR BALLOON ANGIOPLASTY;  Surgeon: Serafina Mitchell, MD;  Location: [facility];  Service: Cardiovascular;  Laterality: Right;   PERIPHERAL VASCULAR THROMBECTOMY N/A 11/29/2017   Procedure: PERIPHERAL VASCULAR THROMBECTOMY - THROMBOLYSIS;  Surgeon: Serafina Mitchell, MD;  Location: [facility];  Service: Cardiovascular;  Laterality: N/A;  LYSIS CATHETER PLACEMENT ONLY   PERIPHERAL VASCULAR THROMBECTOMY N/A 11/30/2017   Procedure: PERIPHERAL VASCULAR THROMBECTOMY - Lysis Recheck;  Surgeon: Serafina Mitchell, MD;  Location: [facility];  Service: Cardiovascular;  Laterality: N/A;   WISDOM TOOTH EXTRACTION      SOCIAL HISTORY: Social History   Socioeconomic History   Marital status: Married    Spouse name: Lelan Pons   Number of children: 0   Years of education: 14    Highest education level: Associate degree: occupational, Hotel manager, or vocational program  Occupational History   Occupation: Hydrographic surveyor: Theme park manager  Tobacco Use   Smoking status: Former    Packs/day: 1.00    Years: 4.00    Total pack years: 4.00    Types: Cigarettes    Quit date: 11/18/1998    Years since quitting: 23.4   Smokeless tobacco: Never  Vaping Use   Vaping Use: Never used  Substance and Sexual Activity   Alcohol use: Not Currently   Drug use: Yes    Frequency: 4.0 times per week    Types: Marijuana    Comment: smokes some at bedtime 3-4 nights/week (helps with spasms)   Sexual activity: Yes    Partners: Male    Birth control/protection: I.U.D.    Comment: Mirena inserted 01/2016  Other Topics Concern   Not on file  Social History Narrative   The patient is divorced.     Re-married Marcello Moores, partner of 10 years, on 10/10/16.  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   Moved from [place] to [place] in 2013.   Past smoker   No alcohol   2-3 caffeinated beverages a day   She reports she is compliant with sunscreen given her increased risk of sun damage and skin cancer (no longer on azathioprine)      Updated 12/31/21   Social Determinants of Health   Financial Resource Strain: Not on file  Food Insecurity: Not on file  Transportation Needs: Not on file  Physical Activity: Not on file  Stress: Not on file  Social Connections: Not on file  Intimate Partner Violence: Not on file    FAMILY HISTORY: Family History  Problem Relation Age of Onset   Hypertension Mother    Breast cancer Mother 50   Bone cancer Mother        metastatic from breast   Depression Mother    Hypothyroidism Mother    Colon polyps Father    Diabetes Father        borderline--resolved 2020   Hypertension Father        off meds now   Hyperlipidemia Father        off meds now   Fuch's dystrophy Father     Multiple sclerosis Sister        affecting brain only   Alcohol abuse Sister    Fuch's dystrophy Sister    Hypertension Sister    Heart disease Sister        poss MI in 73's   Stroke Maternal Grandmother    Lung cancer Maternal Grandmother    Hypertension Maternal Grandmother    Heart disease Paternal Grandmother    Hypertension Paternal Grandmother    Colon cancer Paternal Grandfather    Stomach cancer Neg Hx    Esophageal cancer Neg Hx    Pancreatic cancer Neg Hx     ALLERGIES:  is allergic to iron, iron sucrose, ibuprofen, morphine and related, ofatumumab, adhesive [tape], and prednisone.  MEDICATIONS:  Current Outpatient Medications  Medication Sig Dispense Refill   acetaminophen (TYLENOL) 500 MG tablet Take 1,000 mg by mouth every 6 (six) hours as needed for headache (pain). Reported on 12/15/2015     baclofen (LIORESAL) 10 MG tablet TAKE 1 TABLET(10 MG) BY MOUTH THREE TIMES DAILY AS NEEDED FOR MUSCLE SPASMS 90 tablet 5   buPROPion ER (WELLBUTRIN SR) 100 MG 12 hr tablet TAKE 1 TABLET BY MOUTH TWICE  DAILY 180 tablet 0   cetirizine (ZYRTEC) 10 MG tablet Take 10 mg by mouth at bedtime.      CHOLECALCIFEROL PO Take 5,000 Units by mouth 2 (two) times daily.     dalfampridine 10 MG TB12 TAKE 1 TABLET BY MOUTH  EVERY 12 HOURS 60 tablet 5   DODEX 1000 MCG/ML injection INJECT 1 ML INTO THE SKIN EVERY 14 DAYS 10 mL 2   ELIQUIS 5 MG TABS tablet TAKE 1 TABLET(5 MG) BY MOUTH TWICE DAILY 60 tablet 3   escitalopram (LEXAPRO) 10 MG tablet TAKE 1 TABLET BY MOUTH AT  BEDTIME 90 tablet 1   fenofibrate 160 MG tablet TAKE 1 TABLET BY MOUTH AT  BEDTIME 90 tablet 0   gabapentin (NEURONTIN) 100 MG capsule TAKE 3 CAPSULES(300 MG) BY MOUTH AT BEDTIME 180 capsule 0   Insulin Syringe 27G X 1/2" 1 ML MISC 1 each by Does not apply route every 14 (fourteen) days. For [facility]  B12 injection 24 each 1   levonorgestrel (MIRENA) 20 MCG/24HR IUD 1 each by Intrauterine route once. Implanted April 2017     NON  FORMULARY Take 1 each by mouth at bedtime. CBD/ cannabis     No current facility-administered medications for this visit.    REVIEW OF SYSTEMS:   10 Point review of Systems was done is negative except as noted above.  PHYSICAL EXAMINATION: Telemedicine appointment  LABORATORY DATA:  I have reviewed the data as listed  .    Latest Ref Rng & Units 04/16/2022    1:33 PM 03/19/2022    9:58 AM 01/13/2022    1:46 PM  CBC  WBC 4.0 - 10.5 K/uL 9.8  10.5  9.0   Hemoglobin 12.0 - 15.0 g/dL 14.7  15.1  13.0   Hematocrit 36.0 - 46.0 % 43.3  45.2  41.5   Platelets 150 - 400 K/uL 382  419.0  436    CBC    Component Value Date/Time   WBC 9.8 04/16/2022 1333   WBC 10.5 03/19/2022 0958   RBC 5.05 04/16/2022 1333   HGB 14.7 04/16/2022 1333   HGB 13.4 01/06/2022 0955   HCT 43.3 04/16/2022 1333   HCT 43.5 01/06/2022 0955   PLT 382 04/16/2022 1333   PLT 588 (H) 01/06/2022 0955   MCV 85.7 04/16/2022 1333   MCV 85 01/06/2022 0955   MCH 29.1 04/16/2022 1333   MCHC 33.9 04/16/2022 1333   RDW 14.9 04/16/2022 1333   RDW 14.7 01/06/2022 0955   LYMPHSABS 1.2 04/16/2022 1333   LYMPHSABS 1.1 01/06/2022 0955   MONOABS 0.9 04/16/2022 1333   EOSABS 0.2 04/16/2022 1333   EOSABS 0.3 01/06/2022 0955   BASOSABS 0.1 04/16/2022 1333   BASOSABS 0.1 01/06/2022 0955       Latest Ref Rng & Units 04/16/2022    1:33 PM 03/19/2022    9:58 AM 01/29/2022    4:19 PM  CMP  Glucose 70 - 99 mg/dL 97  75    BUN 6 - 20 mg/dL 26  18    Creatinine 0.44 - 1.00 mg/dL 1.02  1.10    Sodium 135 - 145 mmol/L 136  134    Potassium 3.5 - 5.1 mmol/L 4.4  4.7    Chloride 98 - 111 mmol/L 103  100    CO2 22 - 32 mmol/L 25  25    Calcium 8.9 - 10.3 mg/dL 9.5  9.9    Total Protein 6.5 - 8.1 g/dL 7.8  8.5  7.9   Total Bilirubin 0.3 - 1.2 mg/dL 0.4  0.4  0.3   Alkaline Phos 38 - 126 U/L 126  102  126   AST 15 - 41 U/L 45  39  63   ALT 0 - 44 U/L 88  49  108    . Lab Results  Component Value Date   IRON 95 04/16/2022    TIBC 676 (H) 04/16/2022   IRONPCTSAT 14 04/16/2022   (Iron and TIBC)  Lab Results  Component Value Date   FERRITIN 20 04/16/2022     Lupus anticoagulant panel      The value has a corrected status.      No reference range information available      Resulting Lab: [facility]      Comments:           (NOTE)           No lupus anticoagulant was  detected  RADIOGRAPHIC STUDIES: I have personally reviewed the radiological images as listed and agreed with the findings in the report. No results found.   ASSESSMENT & PLAN:  Lindsay Mccarthy is a 46 y.o. caucasian female with  1. Acute DVT and Submassive PE No definitive provocation of DVT. PE likely from dislodged DVT after mechanical thrombectomy and balloon venoplasty Has IVC filter placed.  On Eliquis since 12/2017 Non smoker. No recent use of hormonal contracept. Prothrombin gene mutation/Factor V leiden mutation negative, Anti-phoshopholipid ab negative. Jak2 testing results negative. Lupus negative. Based on results, elevated platelets likely had a reactive cause due to inflammation from Crohn's and elevated iron.   There is no height or weight on file to calculate BMI. - obesity could an additional contributing risk factor. 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  2. Elevated platelets  Have been elevated 300-800K for over a year  -likely reactive to iron deficiency anemia and inflammation from Crohn's disease. Platelets have improved in tandem with improvement in iron levels.  3. Iron Deficiency Anemia . Lab Results  Component Value Date   IRON 95 04/16/2022   TIBC 676 (H) 04/16/2022   IRONPCTSAT 14 04/16/2022   (Iron and TIBC)  Lab Results  Component Value Date   FERRITIN 20 04/16/2022  -on ferrous sulfate BID. Tolerating well -continue PO ironto maintain ferritin levels >50. -if still low  in 3 months would consider IV iron replacement.  4. Vitamin B12 deficiency  -seen at 0712 on 01/09/18 labs  -Currently on every 14 days B12 injections with PCP, will continue    5. Crohn's Disease -Mx by Dr. Carlean Purl -On Imuran and Entyvio   PLAN:  -Labs done 04/16/2022 showed normal CBC with a hemoglobin of 15.1 normal WBC count of 9.8k and slightly elevated platelets of 382k CMP stable. Ferritin of 20 and iron saturation of 14% Vitamin B12 is 478. -Patient has previously had reaction with IV Venofer with fever and flulike symptoms. Also her blood counts are completely normal at this time. -She will try to increase her p.o. iron intake. -Continue 1000 mcg Vitamin B12 [place] every 2 weeks. -Continue Eliquis -Patient currently has no anemia and her ferritin levels are reasonable with low iron saturation likely in the setting of some element of chronic inflammation related to her Crohn's disease. -phone visit in 3 months with labs done 1 day prior.  FOLLOW UP: Phone visit with Dr Irene Limbo in 3 months Labs day prior to phone visit   The total time spent in the appointment was *** minutes*.  All of the patient's questions were answered with apparent satisfaction. The patient knows to call the clinic with any problems, questions or concerns.   Sullivan Lone MD MS AAHIVMS [facility] Hematology/Oncology Physician [facility]  .*Total Encounter Time as defined by the Centers for Medicare and Medicaid Services includes, in addition to the face-to-face time of a patient visit (documented in the note above) non-face-to-face time: obtaining and reviewing outside history, ordering and reviewing medications, tests or procedures, care coordination (communications with other health care professionals or caregivers) and documentation in the medical record.  I, Melene Muller, am acting as scribe for Dr. Sullivan Lone, MD.

## 2022-04-21 ENCOUNTER — Telehealth: Payer: Self-pay | Admitting: Hematology

## 2022-04-21 NOTE — Telephone Encounter (Signed)
Scheduled follow-up appointments per 6/19 los. Patient is aware.

## 2022-04-25 ENCOUNTER — Encounter: Payer: Self-pay | Admitting: Hematology

## 2022-05-07 NOTE — Telephone Encounter (Signed)
I faxed the anti-coag clearance note to Dr Irene Limbo, fax # [phone] after confirming this fax # with the [facility].

## 2022-05-11 ENCOUNTER — Other Ambulatory Visit: Payer: Self-pay | Admitting: Family Medicine

## 2022-05-11 DIAGNOSIS — F325 Major depressive disorder, single episode, in full remission: Secondary | ICD-10-CM

## 2022-05-11 NOTE — Telephone Encounter (Signed)
Has upcoming appt in september

## 2022-05-12 ENCOUNTER — Other Ambulatory Visit: Payer: Self-pay | Admitting: Family Medicine

## 2022-05-12 DIAGNOSIS — Z86711 Personal history of pulmonary embolism: Secondary | ICD-10-CM

## 2022-05-12 DIAGNOSIS — Z7901 Long term (current) use of anticoagulants: Secondary | ICD-10-CM

## 2022-05-13 ENCOUNTER — Telehealth: Payer: Self-pay

## 2022-05-13 DIAGNOSIS — K50012 Crohn's disease of small intestine with intestinal obstruction: Secondary | ICD-10-CM

## 2022-05-13 DIAGNOSIS — Z932 Ileostomy status: Secondary | ICD-10-CM

## 2022-05-13 NOTE — Telephone Encounter (Signed)
I have re-faxed the anti-coag clearance as requested.

## 2022-05-13 NOTE — Telephone Encounter (Signed)
Trish informed that lab orders are in and that the referral has been placed to the ostomy clinic. She request I send her a [place] message with their phone number as she didn't have a pen on her to write it down.

## 2022-05-13 NOTE — Telephone Encounter (Signed)
She can be referred to [facility] to get an appointment  Please have her do a quantiferon test in anticipation of [place]

## 2022-05-13 NOTE — Telephone Encounter (Signed)
I spoke with Lindsay Mccarthy and she inquired about when she will start the [place]. Also she said she thinks she is going to keep her ostomy and wants to switch to our ostomy place. She wants to know how does she do that Sir? Thank you for your time.

## 2022-05-13 NOTE — Telephone Encounter (Signed)
I have called Trish and told her I am still working on getting her Eliquis clearance. I called and left a detailed message with Dr Grier Mitts office for their help.

## 2022-05-13 NOTE — Telephone Encounter (Signed)
Received a call from [facility] at Dr. Grier Mitts office.  She asked that you fax the clearance request to her direct fax line and she will take care of your request.  Her direct fax is [phone].  Thank you.

## 2022-05-14 ENCOUNTER — Other Ambulatory Visit: Payer: 59

## 2022-05-14 DIAGNOSIS — K50012 Crohn's disease of small intestine with intestinal obstruction: Secondary | ICD-10-CM

## 2022-05-14 NOTE — Telephone Encounter (Signed)
I left Lindsay Mccarthy a detailed voicemail that we received a fax with approval for her to hold her Eliquis the day prior to her procedure and the day of her procedure from Dr Grier Mitts office. I told her to call me back or [employer] [place] so I can confirm she got the message.

## 2022-05-17 LAB — QUANTIFERON-TB GOLD PLUS
Mitogen-NIL: 8.68 IU/mL
NIL: 0.04 IU/mL
QuantiFERON-TB Gold Plus: NEGATIVE
TB1-NIL: <0 IU/mL
TB2-NIL: 0 IU/mL

## 2022-05-17 NOTE — Telephone Encounter (Signed)
Left her a voicemail on her cell # with Eliquis instructions and told her to call and let me know she got the message.

## 2022-05-18 NOTE — Progress Notes (Unsigned)
  Start time: End time:  Virtual Visit via Video Note  I connected with Lindsay Mccarthy on 05/18/22 by a video enabled telemedicine application and verified that I am speaking with the correct person using two identifiers.  Location: Patient: *** Provider: office   I discussed the limitations of evaluation and management by telemedicine and the availability of in person appointments. The patient expressed understanding and agreed to proceed.  History of Present Illness:  No chief complaint on file.  Per 12/2021 CPE: Depression follow-up: She remains on Lexapro 24m daily and Wellbutrin SR 1070mBID. She notices that she sees the pill pass through in her stool/ostomy.  No impact yet on her moods, but she would like to continue to take it rather than a trial off. She previously didn't tolerate the higher dose of wellbutrin in the past.  Last year she reported the pharmacy switched generic for buproprion, and felt that it was working even better.  She continues to feel very good on these medications.      01/14/2022    8:45 AM 04/22/2021   11:25 AM 12/31/2020    8:49 AM 12/24/2019    9:09 AM 12/24/2019    8:34 AM  Depression screen PHQ 2/9  Decreased Interest 0 0 0 0 0  Down, Depressed, Hopeless 0 0 0 0 0  PHQ - 2 Score 0 0 0 0 0  Altered sleeping 0 0 0 0   Tired, decreased energy 0 0 1 1   Change in appetite 0 0 2 0   Feeling bad or failure about yourself  0 0 0 0   Trouble concentrating 0 0 1 0   Moving slowly or fidgety/restless 0 0 0 0   Suicidal thoughts 0 0 0 0   PHQ-9 Score 0 0 4 1   Difficult doing work/chores Not difficult at all Not difficult at all Not difficult at all        Observations/Objective:  There were no vitals taken for this visit.   Assessment and Plan:   Follow Up Instructions:    I discussed the assessment and treatment plan with the patient. The patient was provided an opportunity to ask questions and all were answered. The patient agreed  with the plan and demonstrated an understanding of the instructions.   The patient was advised to call back or seek an in-person evaluation if the symptoms worsen or if the condition fails to improve as anticipated.  I spent *** minutes dedicated to the care of this patient, including pre-visit review of records, face to face time, post-visit ordering of testing and documentation.    EvVikki PortsMD

## 2022-05-19 ENCOUNTER — Encounter: Payer: Self-pay | Admitting: Family Medicine

## 2022-05-19 ENCOUNTER — Other Ambulatory Visit: Payer: Self-pay | Admitting: Neurology

## 2022-05-19 ENCOUNTER — Telehealth (INDEPENDENT_AMBULATORY_CARE_PROVIDER_SITE_OTHER): Payer: 59 | Admitting: Family Medicine

## 2022-05-19 VITALS — Ht 64.0 in | Wt 201.0 lb

## 2022-05-19 DIAGNOSIS — F325 Major depressive disorder, single episode, in full remission: Secondary | ICD-10-CM | POA: Diagnosis not present

## 2022-05-19 MED ORDER — BUPROPION HCL 100 MG PO TABS: 100.0000 mg | ORAL_TABLET | Freq: Two times a day (BID) | ORAL | 5 refills | Status: DC

## 2022-05-20 ENCOUNTER — Telehealth (HOSPITAL_COMMUNITY): Payer: Self-pay | Admitting: Pharmacy Technician

## 2022-05-20 NOTE — Telephone Encounter (Signed)
Patient Advocate Encounter   Received notification that prior authorization for Dalfampridine ER 10MG er tablets is required.   PA submitted on 05/20/2022 Key [id number] Status is pending       Lyndel Safe, [place] Patient Advocate Specialist [place] Patient Advocate Team Direct Number: [phone]  Fax: [phone]

## 2022-05-20 NOTE — Telephone Encounter (Signed)
Patient Advocate Encounter  Prior Authorization for Dalfampridine ER 10MG er tablets has been approved.    PA# [id number] Effective dates: 05/20/2022 through 05/21/2023      Lyndel Safe, [place] Patient Advocate Specialist [place] Patient Advocate Team Direct Number: [phone]  Fax: [phone]

## 2022-05-21 NOTE — Telephone Encounter (Signed)
I have spoken to patient to advise that she may hold Eliquis one day prior to test and day of test. She verbalizes understanding of this information.

## 2022-06-01 ENCOUNTER — Encounter: Payer: Self-pay | Admitting: Internal Medicine

## 2022-06-07 ENCOUNTER — Other Ambulatory Visit: Payer: Self-pay | Admitting: Internal Medicine

## 2022-06-07 ENCOUNTER — Encounter: Payer: Self-pay | Admitting: Internal Medicine

## 2022-06-07 ENCOUNTER — Ambulatory Visit (AMBULATORY_SURGERY_CENTER): Payer: 59 | Admitting: Internal Medicine

## 2022-06-07 VITALS — BP 110/70 | HR 65 | Temp 97.3°F | Resp 13 | Ht 64.0 in | Wt 210.0 lb

## 2022-06-07 DIAGNOSIS — K50112 Crohn's disease of large intestine with intestinal obstruction: Secondary | ICD-10-CM

## 2022-06-07 DIAGNOSIS — K2980 Duodenitis without bleeding: Secondary | ICD-10-CM

## 2022-06-07 DIAGNOSIS — K50018 Crohn's disease of small intestine with other complication: Secondary | ICD-10-CM | POA: Diagnosis present

## 2022-06-07 MED ORDER — SODIUM CHLORIDE 0.9 % IV SOLN: 500.0000 mL | Freq: Once | INTRAVENOUS | Status: DC

## 2022-06-07 NOTE — Op Note (Signed)
[facility] Patient Name: Lindsay Mccarthy Procedure Date: 06/07/2022 8:33 AM MRN: [id number] Endoscopist: Gatha Mayer , MD Age: 46 Referring MD:  Date of Birth: April 21, 1976 Gender: Female Account #: [id number] Procedure:                Ileoscopy Indications:              History of colectomy, Inflammatory bowel disease Medicines:                Monitored Anesthesia Care Procedure:                Pre-Anesthesia Assessment:                           - Prior to the procedure, a History and Physical                            was performed, and patient medications and                            allergies were reviewed. The patient's tolerance of                            previous anesthesia was also reviewed. The risks                            and benefits of the procedure and the sedation                            options and risks were discussed with the patient.                            All questions were answered, and informed consent                            was obtained. Prior Anticoagulants: The patient                            last took Eliquis (apixaban) 2 days prior to the                            procedure. ASA Grade Assessment: III - A patient                            with severe systemic disease. After reviewing the                            risks and benefits, the patient was deemed in                            satisfactory condition to undergo the procedure.                           After I obtained informed consent, the scope was  passed under direct vision. Throughout the                            procedure, the patient's blood pressure, pulse, and                            oxygen saturations were monitored continuously. The                            Olympus PCF-H190DL (#[id number]) Colonoscope was                            introduced through the ileostomy and advanced to 65                            cm into the  ileum. The ileoscopy was performed                            without difficulty. The patient tolerated the                            procedure well. The quality of the bowel                            preparation was excellent. Scope In: 9:04:47 AM Scope Out: 9:18:21 AM Total Procedure Duration: 0 hours 13 minutes 34 seconds  Findings:                 Patient is status-post end-ileostomy with an end                            ileostomy.                           The entire examined ileum contained multiple 1-2 mm                            ulcers. No bleeding was present. Biopsies were                            taken with a cold forceps for histology.                            Verification of patient identification for the                            specimen was done. Estimated blood loss was minimal. Complications:            No immediate complications. Estimated Blood Loss:     Estimated blood loss was minimal. Impression:               - Multiple ulcers in the entire examined ileum.                            Biopsied. Recommendation:           -   Await pathology results.                           - The patient will be observed post-procedure,                            until all discharge criteria are met.                           - Resume Eliquis (apixaban) at prior dose tomorrow.                           - anticipate starting Stelara - she is most likely                            not going to have [place] of ileostomy Gatha Mayer, MD 06/07/2022 9:35:15 AM This report has been signed electronically.

## 2022-06-07 NOTE — Patient Instructions (Addendum)
There were scattered tiny ulcers seen - biopsies taken but this is consistent with active Crohn's.  I was waiting to start Stelara until the surgery decision was made and you take your trip.  I appreciate the opportunity to care for you. Gatha Mayer, MD, FACG   YOU HAD AN ENDOSCOPIC PROCEDURE TODAY AT [facility]:   Refer to the procedure report that was given to you for any specific questions about what was found during the examination.  If the procedure report does not answer your questions, please call your gastroenterologist to clarify.  If you requested that your care partner not be given the details of your procedure findings, then the procedure report has been included in a sealed envelope for you to review at your convenience later.  YOU SHOULD EXPECT: Some feelings of bloating in the abdomen. Passage of more gas than usual.  Walking can help get rid of the air that was put into your GI tract during the procedure and reduce the bloating. If you had a lower endoscopy (such as a colonoscopy or flexible sigmoidoscopy) you may notice spotting of blood in your stool or on the toilet paper. If you underwent a bowel prep for your procedure, you may not have a normal bowel movement for a few days.  Please Note:  You might notice some irritation and congestion in your nose or some drainage.  This is from the oxygen used during your procedure.  There is no need for concern and it should clear up in a day or so.  SYMPTOMS TO REPORT IMMEDIATELY:  Following lower endoscopy (colonoscopy or flexible sigmoidoscopy):  Excessive amounts of blood in the stool  Significant tenderness or worsening of abdominal pains  Swelling of the abdomen that is new, acute  Fever of 100F or higher  For urgent or emergent issues, a gastroenterologist can be reached at any hour by calling [phone]. Do not use MyChart messaging for urgent concerns.    DIET:  We do recommend a small meal at  first, but then you may proceed to your regular diet.  Drink plenty of fluids but you should avoid alcoholic beverages for 24 hours.  ACTIVITY:  You should plan to take it easy for the rest of today and you should NOT DRIVE or use heavy machinery until tomorrow (because of the sedation medicines used during the test).    FOLLOW UP: Our staff will call the number listed on your records the next business day following your procedure.  We will call around 7:15- 8:00 am to check on you and address any questions or concerns that you may have regarding the information given to you following your procedure. If we do not reach you, we will leave a message.  If you develop any symptoms (ie: fever, flu-like symptoms, shortness of breath, cough etc.) before then, please call [phone].  If you test positive for Covid 19 in the 2 weeks post procedure, please call and report this information to [place].    If any biopsies were taken you will be contacted by phone or by letter within the next 1-3 weeks.  Please call us at [phone] if you have not heard about the biopsies in 3 weeks.    SIGNATURES/CONFIDENTIALITY: You and/or your care partner have signed paperwork which will be entered into your electronic medical record.  These signatures attest to the fact that that the information above on your After Visit Summary has been reviewed and is understood.  Full responsibility of the confidentiality of this discharge information lies with you and/or your care-partner.

## 2022-06-07 NOTE — Progress Notes (Signed)
Called to room to assist during endoscopic procedure.  Patient ID and intended procedure confirmed with present staff. Received instructions for my participation in the procedure from the performing physician.  

## 2022-06-07 NOTE — Progress Notes (Signed)
[facility] History and Physical   Primary Care Physician:  Rita Ohara, MD   Reason for Procedure:   Crohn's disease  Plan:    ileoscopy     HPI: Lindsay Mccarthy is a 46 y.o. female here for disease activity assessment of small bowel Crohn's disease after resection of stricture earlier this year.    Past Medical History:  Diagnosis Date   Anemia    related to Crohns flares   Arm DVT (deep venous thromboembolism), acute, right ([place]) 11/29/2017   Arthritis    knees, feet, hands, wrists, related to Crohns flare   Asthma    Childhood   B12 deficiency    monitored/treated by Dr. Carlean Purl   Cervical dysplasia    Clotting disorder ([place])    on eliquis hx dvt/ pulmonary embolism   COVID 04/13/2021   Crohn disease ([place])    Crohn's disease of small intestine ([place]) 06/24/2012   Diagnosed 1999, in [place]. Ileitis only then. Treated with Imuran Remicade and prednisone.  Noncompliant with therapy. 2004 return to care and was treated with Remicade prednisone Pentasa Cipro and Flagyl. 2006 status post right hemicolectomy. Subsequently treated with azathioprine and Cimzia. 200 mg every other week.  azathioprine was added in 2010. Prometheus TP MT enzyme was negative.   Depression    DVT of upper extremity (deep vein thrombosis) ([place]) 11/28/2017   Eczema    arms and behind knees, worse in winter   Generalized anxiety disorder    History of recurrent UTI (urinary tract infection)    HPV (human papilloma virus) infection    Hyperlipemia    Hypertriglyceridemia 07/03/2012   10/2010 labs showed level of 753 with total cholesterol 180 and HDL 41' Labs 05/2013--TG 180    Major depressive disorder in remission ([place]) 10/03/2013   MS (multiple sclerosis) ([place])    Multiple sclerosis ([place]) 2019   Officially diagnosed in the Fall   Neuromuscular disorder ([place])    Osteopenia 09/29/2011   T-1.6   Papanicolaou smear 10/2011   last abnormal 2011   Pulmonary embolism ([place])  11/30/2017   Scaphoid fracture of wrist 2013   left   Seasonal allergic rhinitis    Shingles 09/2015   R hip   Vitamin B12 deficiency 06/19/2012   Vitamin D deficiency 12/16/2015   Vitamin D-OH level 12     Past Surgical History:  Procedure Laterality Date   APPENDECTOMY     CERVICAL BIOPSY  W/ LOOP ELECTRODE EXCISION  11/02/2007   ---paps normal since   COLONOSCOPY  multiple   scanned   ESOPHAGOGASTRODUODENOSCOPY  multiple   scanned   HEMICOLECTOMY  11/01/2004   ILEOCECETOMY  12/11/2021   and end ileostomy; at [facility]  12/03/2017   IR ANGIOGRAM SELECTIVE EACH ADDITIONAL VESSEL  12/03/2017   IR ANGIOGRAM SELECTIVE EACH ADDITIONAL VESSEL  12/03/2017   IR INFUSION THROMBOL ARTERIAL INITIAL (MS)  12/03/2017   IR INFUSION THROMBOL ARTERIAL INITIAL (MS)  12/03/2017   IR IVC FILTER PLMT / S&I /IMG GUID/MOD SED  12/04/2017   IR THROMB F/U EVAL ART/VEN FINAL DAY ([place])  12/04/2017   IR US GUIDE VASC ACCESS RIGHT  12/03/2017   IVC FILTER REMOVAL N/A 07/11/2018   Procedure: IVC FILTER REMOVAL;  Surgeon: Serafina Mitchell, MD;  Location: [facility];  Service: Cardiovascular;  Laterality: N/A;   IVC VENOGRAPHY N/A 07/11/2018   Procedure: IVC Venography;  Surgeon: Serafina Mitchell, MD;  [facility]  INVASIVE CV [facility];  Service: Cardiovascular;  Laterality: N/A;   LEEP  11/01/2009   --done in [place] Right 11/30/2017   Procedure: PERIPHERAL VASCULAR BALLOON ANGIOPLASTY;  Surgeon: Serafina Mitchell, MD;  Location: [facility];  Service: Cardiovascular;  Laterality: Right;   PERIPHERAL VASCULAR THROMBECTOMY N/A 11/29/2017   Procedure: PERIPHERAL VASCULAR THROMBECTOMY - THROMBOLYSIS;  Surgeon: Serafina Mitchell, MD;  Location: [facility];  Service: Cardiovascular;  Laterality: N/A;  LYSIS CATHETER PLACEMENT ONLY   PERIPHERAL VASCULAR THROMBECTOMY N/A 11/30/2017   Procedure: PERIPHERAL VASCULAR  THROMBECTOMY - Lysis Recheck;  Surgeon: Serafina Mitchell, MD;  Location: [facility];  Service: Cardiovascular;  Laterality: N/A;   WISDOM TOOTH EXTRACTION      Prior to Admission medications   Medication Sig Start Date End Date Taking? Authorizing Provider  baclofen (LIORESAL) 10 MG tablet TAKE 1 TABLET(10 MG) BY MOUTH THREE TIMES DAILY AS NEEDED FOR MUSCLE SPASMS 12/22/21  Yes Tomi Likens, Adam R, DO  buPROPion (WELLBUTRIN) 100 MG tablet Take 1 tablet (100 mg total) by mouth 2 (two) times daily. 05/19/22  Yes Rita Ohara, MD  cetirizine (ZYRTEC) 10 MG tablet Take 10 mg by mouth at bedtime.    Yes [provider]  CHOLECALCIFEROL PO Take 5,000 Units by mouth 2 (two) times daily.   Yes [provider]  dalfampridine 10 MG TB12 TAKE 1 TABLET BY MOUTH  EVERY 12 HOURS 01/27/22  Yes Jaffe, Adam R, DO  escitalopram (LEXAPRO) 10 MG tablet TAKE 1 TABLET BY MOUTH AT  BEDTIME 03/30/22  Yes Rita Ohara, MD  fenofibrate 160 MG tablet TAKE 1 TABLET BY MOUTH AT  BEDTIME 03/16/22  Yes Tysinger, Camelia Eng, PA-C  ferrous sulfate 324 MG TBEC Take 324 mg by mouth.   Yes [provider]  gabapentin (NEURONTIN) 100 MG capsule TAKE 3 CAPSULES(300 MG) BY MOUTH AT BEDTIME 05/19/22  Yes Jaffe, Adam R, DO  levonorgestrel (MIRENA) 20 MCG/24HR IUD 1 each by Intrauterine route once. Implanted April 2017 05/19/11  Yes [provider]  acetaminophen (TYLENOL) 500 MG tablet Take 1,000 mg by mouth every 6 (six) hours as needed for headache (pain). Reported on 12/15/2015 Patient not taking: Reported on 05/19/2022    [provider]  DODEX 1000 MCG/ML injection INJECT 1 ML INTO THE SKIN EVERY 14 DAYS 08/31/21   Brunetta Genera, MD  ELIQUIS 5 MG TABS tablet TAKE 1 TABLET(5 MG) BY MOUTH TWICE DAILY 05/12/22   Rita Ohara, MD  Insulin Syringe 27G X 1/2" 1 ML MISC 1 each by Does not apply route every 14 (fourteen) days. For [place] B12 injection 07/08/20   Brunetta Genera, MD  NON FORMULARY Take 1  each by mouth at bedtime. CBD/ cannabis Patient not taking: Reported on 05/19/2022    [provider]    Current Outpatient Medications  Medication Sig Dispense Refill   baclofen (LIORESAL) 10 MG tablet TAKE 1 TABLET(10 MG) BY MOUTH THREE TIMES DAILY AS NEEDED FOR MUSCLE SPASMS 90 tablet 5   buPROPion (WELLBUTRIN) 100 MG tablet Take 1 tablet (100 mg total) by mouth 2 (two) times daily. 60 tablet 5   cetirizine (ZYRTEC) 10 MG tablet Take 10 mg by mouth at bedtime.      CHOLECALCIFEROL PO Take 5,000 Units by mouth 2 (two) times daily.     dalfampridine 10 MG TB12 TAKE 1 TABLET BY MOUTH  EVERY 12 HOURS 60 tablet 5  escitalopram (LEXAPRO) 10 MG tablet TAKE 1 TABLET BY MOUTH AT  BEDTIME 90 tablet 1   fenofibrate 160 MG tablet TAKE 1 TABLET BY MOUTH AT  BEDTIME 90 tablet 0   ferrous sulfate 324 MG TBEC Take 324 mg by mouth.     gabapentin (NEURONTIN) 100 MG capsule TAKE 3 CAPSULES(300 MG) BY MOUTH AT BEDTIME 90 capsule 0   levonorgestrel (MIRENA) 20 MCG/24HR IUD 1 each by Intrauterine route once. Implanted April 2017     acetaminophen (TYLENOL) 500 MG tablet Take 1,000 mg by mouth every 6 (six) hours as needed for headache (pain). Reported on 12/15/2015 (Patient not taking: Reported on 05/19/2022)     DODEX 1000 MCG/ML injection INJECT 1 ML INTO THE SKIN EVERY 14 DAYS 10 mL 2   ELIQUIS 5 MG TABS tablet TAKE 1 TABLET(5 MG) BY MOUTH TWICE DAILY 120 tablet 0   Insulin Syringe 27G X 1/2" 1 ML MISC 1 each by Does not apply route every 14 (fourteen) days. For [place] B12 injection 24 each 1   NON FORMULARY Take 1 each by mouth at bedtime. CBD/ cannabis (Patient not taking: Reported on 05/19/2022)     Current Facility-Administered Medications  Medication Dose Route Frequency Provider Last Rate Last Admin   0.9 %  sodium chloride infusion  500 mL Intravenous Once Gatha Mayer, MD        Allergies as of 06/07/2022 - Review Complete 06/07/2022  Allergen Reaction Noted   Iron Other (See Comments)  05/07/2020   Iron sucrose Other (See Comments) 01/22/2020   Ibuprofen Other (See Comments) 10/03/2012   Morphine and related Itching 06/14/2017   Ofatumumab Other (See Comments) 11/12/2021   Adhesive [tape] Rash 06/14/2017   Prednisone Other (See Comments) and Anxiety 06/26/2012    Family History  Problem Relation Age of Onset   Hypertension Mother    Breast cancer Mother 70   Bone cancer Mother        metastatic from breast   Depression Mother    Hypothyroidism Mother    Colon polyps Father    Diabetes Father        borderline--resolved 2020   Hypertension Father        off meds now   Hyperlipidemia Father        off meds now   Fuch's dystrophy Father    Multiple sclerosis Sister        affecting brain only   Alcohol abuse Sister    Fuch's dystrophy Sister    Hypertension Sister    Heart disease Sister        poss MI in 42's   Stroke Maternal Grandmother    Lung cancer Maternal Grandmother    Hypertension Maternal Grandmother    Heart disease Paternal Grandmother    Hypertension Paternal Grandmother    Colon cancer Paternal Grandfather    Stomach cancer Neg Hx    Esophageal cancer Neg Hx    Pancreatic cancer Neg Hx     Social History   Socioeconomic History   Marital status: Married    Spouse name: Lelan Pons   Number of children: 0   Years of education: 14   Highest education level: Associate degree: occupational, Hotel manager, or vocational program  Occupational History   Occupation: Hydrographic surveyor: Theme park manager  Tobacco Use   Smoking status: Former    Packs/day: 1.00    Years: 4.00    Total pack years: 4.00    Types: Cigarettes  Quit date: 11/18/1998    Years since quitting: 23.5   Smokeless tobacco: Never  Vaping Use   Vaping Use: Never used  Substance and Sexual Activity   Alcohol use: Not Currently   Drug use: Yes    Frequency: 4.0 times per week    Types: Marijuana    Comment: smokes some at bedtime 3-4 nights/week (helps  with spasms)   Sexual activity: Yes    Partners: Male    Birth control/protection: I.U.D.    Comment: Mirena inserted 01/2016  Other Topics Concern   Not on file  Social History Narrative   The patient is divorced.     Re-married Marcello Moores, partner of 10 years, on 10/10/16. 2 dogs, 1 cat   No children - doesn't want any   Curator for [employer].   Moved from [place] to [place] in 2013.   Past smoker   No alcohol   2-3 caffeinated beverages a day   She reports she is compliant with sunscreen given her increased risk of sun damage and skin cancer (no longer on azathioprine)      Updated 12/31/21   Social Determinants of Health   Financial Resource Strain: Not on file  Food Insecurity: Not on file  Transportation Needs: Not on file  Physical Activity: Not on file  Stress: Not on file  Social Connections: Not on file  Intimate Partner Violence: Not on file    Review of Systems:  All other review of systems negative except as mentioned in the HPI.  Physical Exam: Vital signs BP 119/77   Pulse 88   Temp (!) 97.3 F (36.3 C)   Resp 10   Ht 5' 4"  (1.626 m)   Wt 210 lb (95.3 kg)   SpO2 98%   BMI 36.05 kg/m   General:   Alert,  Well-developed, well-nourished, pleasant and cooperative in NAD Lungs:  Clear throughout to auscultation.   Heart:  Regular rate and rhythm; no murmurs, clicks, rubs,  or gallops. Abdomen:  Soft, nontender and nondistended. Normal bowel sounds.   Neuro/Psych:  Alert and cooperative. Normal mood and affect. A and O x 3   @Shondra Capps  Simonne Maffucci, MD, [facility] [phone] (pager) 06/07/2022 8:46 AM@

## 2022-06-07 NOTE — Progress Notes (Signed)
Pt's states no medical or surgical changes since previsit or office visit. 

## 2022-06-07 NOTE — Progress Notes (Signed)
PT taken to PACU. Monitors in place. VSS. Report given to RN. 

## 2022-06-08 ENCOUNTER — Other Ambulatory Visit: Payer: Self-pay | Admitting: Medical

## 2022-06-08 ENCOUNTER — Telehealth: Payer: Self-pay | Admitting: *Deleted

## 2022-06-08 DIAGNOSIS — E781 Pure hyperglyceridemia: Secondary | ICD-10-CM

## 2022-06-08 NOTE — Telephone Encounter (Signed)
Left message on f/u call 

## 2022-06-10 ENCOUNTER — Other Ambulatory Visit: Payer: Self-pay | Admitting: Internal Medicine

## 2022-06-10 ENCOUNTER — Telehealth: Payer: Self-pay | Admitting: Pharmacy Technician

## 2022-06-10 MED ORDER — STELARA 90 MG/ML ~~LOC~~ SOSY: 90.0000 mg | PREFILLED_SYRINGE | SUBCUTANEOUS | 5 refills | Status: DC

## 2022-06-10 NOTE — Telephone Encounter (Addendum)
Dr. Lorelei Pont note:  Auth Submission: APPROVED Delsa Grana IV: MEDICAL BENEFIT Payer: [facility] Medication & CPT/J Code(s) submitted: Stelara Infusion (Ustekinumab) J3358 Route of submission (phone, fax, portal): portal. [employer] Auth type: Buy/Bill Units/visits requested: 520MG X1 DOSE Reference number: [id number] Approval from: 06/10/22 to 07/11/22 at [place] as Buy/Bill  07/12/22: auth has been extended due to patient never received induction dose. Josem Kaufmann has been extended 09/30/22 Ref: [id number] Rep: Alish-P @9 :07A  Auth Submission: denied STELARA SQ: PHARMACY BENEFIT Payer: [employer] Medication & CPT/J Code(s) submitted: Stelara SQ injection (Ustekinumab) J3357 Route of submission (phone, fax, portal): [place] type: Pharmacy Benefit Units/visits requested: [place] Reference number: [id number] Approval from:  to  at [place] as   [place] aware of stelara sq (pharmacy benefit)  denial and will work on Teacher, adult education.      Stelar co-pay card: APPROVED  06/10/22 - 10/31/22  PHONE: [phone] FAX: [phone] QI:[id number] GR: [id number] AMOUNT$ [id number]

## 2022-06-15 ENCOUNTER — Encounter: Payer: Self-pay | Admitting: Neurology

## 2022-06-15 ENCOUNTER — Other Ambulatory Visit: Payer: Self-pay | Admitting: Family Medicine

## 2022-06-15 ENCOUNTER — Telehealth: Payer: Self-pay | Admitting: Internal Medicine

## 2022-06-15 ENCOUNTER — Encounter: Payer: Self-pay | Admitting: Family Medicine

## 2022-06-15 DIAGNOSIS — Z1231 Encounter for screening mammogram for malignant neoplasm of breast: Secondary | ICD-10-CM

## 2022-06-15 MED ORDER — SCOPOLAMINE 1 MG/3DAYS TD PT72: 1.0000 | MEDICATED_PATCH | TRANSDERMAL | 0 refills | Status: DC

## 2022-06-15 NOTE — Telephone Encounter (Signed)
Received a call from [employer].  Her Stelara pre-filled syringe needs to have a prior authorization.

## 2022-06-15 NOTE — Telephone Encounter (Signed)
Please see note below. 

## 2022-06-15 NOTE — Progress Notes (Signed)
46 y.o. G76P0000 Married Caucasian female here for annual exam.   Has ileosotomy.  She is pleased with this.   No periods with Mirena.  Wants to continue with this.  PCP:  Rita Ohara, MD   No LMP recorded. (Menstrual status: IUD).     Period Cycle (Days):  (only occ. spotting with mirena  IUD)     Sexually active: Yes.    The current method of family planning is IUD--Mirena 02-12-16.    Exercising: No.  The patient does not participate in regular exercise at present. Smoker:  Former  Health Maintenance: Pap:  01-09-18 Neg:Neg HR HPV, 01-13-16 Neg:Neg HR HPV History of abnormal Pap:  yes, Hx of LEEP MMG:  01-21-21 Neg/BiRads1.  Appt.07-06-22 Colonoscopy:  06-07-22 normal;hx of crohn;s disease BMD: 10-14-16  Result :Osteopenia of hip TDaP:  12-15-15 Gardasil:   no HIV: Neg in past Hep C: 03-16-22 Neg Screening Labs:   PCP, hematology, GI, and neurology.    reports that she quit smoking about 23 years ago. Her smoking use included cigarettes. She has a 4.00 pack-year smoking history. She has never used smokeless tobacco. She reports that she does not currently use alcohol. She reports current drug use. Frequency: 4.00 times per week. Drug: Marijuana.  Past Medical History:  Diagnosis Date   Anemia    related to Crohns flares   Arm DVT (deep venous thromboembolism), acute, right ([place]) 11/29/2017   Arthritis    knees, feet, hands, wrists, related to Crohns flare   Asthma    Childhood   B12 deficiency    monitored/treated by Dr. Carlean Purl   Cervical dysplasia    Clotting disorder ([place])    on eliquis hx dvt/ pulmonary embolism   COVID 04/13/2021   Crohn disease ([place])    Crohn's disease of small intestine ([place]) 06/24/2012   Diagnosed 1999, in [place]. Ileitis only then. Treated with Imuran Remicade and prednisone.  Noncompliant with therapy. 2004 return to care and was treated with Remicade prednisone Pentasa Cipro and Flagyl. 2006 status post right hemicolectomy. Subsequently treated with  azathioprine and Cimzia. 200 mg every other week.  azathioprine was added in 2010. Prometheus TP MT enzyme was negative.   Depression    DVT of upper extremity (deep vein thrombosis) ([place]) 11/28/2017   Eczema    arms and behind knees, worse in winter   Generalized anxiety disorder    History of recurrent UTI (urinary tract infection)    HPV (human papilloma virus) infection    Hyperlipemia    Hypertriglyceridemia 07/03/2012   10/2010 labs showed level of 753 with total cholesterol 180 and HDL 41' Labs 05/2013--TG 180    Major depressive disorder in remission ([place]) 10/03/2013   MS (multiple sclerosis) ([place])    Multiple sclerosis ([place]) 2019   Officially diagnosed in the Fall   Neuromuscular disorder ([place])    Osteopenia 09/29/2011   T-1.6   Papanicolaou smear 10/2011   last abnormal 2011   Pulmonary embolism ([place]) 11/30/2017   Scaphoid fracture of wrist 2013   left   Seasonal allergic rhinitis    Shingles 09/2015   R hip   Vitamin B12 deficiency 06/19/2012   Vitamin D deficiency 12/16/2015   Vitamin D-OH level 12     Past Surgical History:  Procedure Laterality Date   APPENDECTOMY     CERVICAL BIOPSY  W/ LOOP ELECTRODE EXCISION  11/02/2007   ---paps normal since   COLONOSCOPY  multiple   scanned   ESOPHAGOGASTRODUODENOSCOPY  multiple  scanned   HEMICOLECTOMY  11/01/2004   ILEOCECETOMY  12/11/2021   and end ileostomy; at [place]  12/03/2017   IR ANGIOGRAM SELECTIVE EACH ADDITIONAL VESSEL  12/03/2017   IR ANGIOGRAM SELECTIVE EACH ADDITIONAL VESSEL  12/03/2017   IR INFUSION THROMBOL ARTERIAL INITIAL (MS)  12/03/2017   IR INFUSION THROMBOL ARTERIAL INITIAL (MS)  12/03/2017   IR IVC FILTER PLMT / S&I /IMG GUID/MOD SED  12/04/2017   IR THROMB F/U EVAL ART/VEN FINAL DAY (MS)  12/04/2017   IR US GUIDE VASC ACCESS RIGHT  12/03/2017   IVC FILTER REMOVAL N/A 07/11/2018   Procedure: IVC FILTER REMOVAL;  Surgeon: Serafina Mitchell, MD;   Location: [facility];  Service: Cardiovascular;  Laterality: N/A;   IVC VENOGRAPHY N/A 07/11/2018   Procedure: IVC Venography;  Surgeon: Serafina Mitchell, MD;  Location: [facility];  Service: Cardiovascular;  Laterality: N/A;   LEEP  11/01/2009   --done in [place] Right 11/30/2017   Procedure: PERIPHERAL VASCULAR BALLOON ANGIOPLASTY;  Surgeon: Serafina Mitchell, MD;  Location: [facility];  Service: Cardiovascular;  Laterality: Right;   PERIPHERAL VASCULAR THROMBECTOMY N/A 11/29/2017   Procedure: PERIPHERAL VASCULAR THROMBECTOMY - THROMBOLYSIS;  Surgeon: Serafina Mitchell, MD;  Location: [facility];  Service: Cardiovascular;  Laterality: N/A;  LYSIS CATHETER PLACEMENT ONLY   PERIPHERAL VASCULAR THROMBECTOMY N/A 11/30/2017   Procedure: PERIPHERAL VASCULAR THROMBECTOMY - Lysis Recheck;  Surgeon: Serafina Mitchell, MD;  Location: [facility];  Service: Cardiovascular;  Laterality: N/A;   WISDOM TOOTH EXTRACTION      Current Outpatient Medications  Medication Sig Dispense Refill   acetaminophen (TYLENOL) 500 MG tablet Take 1,000 mg by mouth every 6 (six) hours as needed for headache (pain). Reported on 12/15/2015     baclofen (LIORESAL) 10 MG tablet TAKE 1 TABLET(10 MG) BY MOUTH THREE TIMES DAILY AS NEEDED FOR MUSCLE SPASMS 90 tablet 5   buPROPion (WELLBUTRIN) 100 MG tablet Take 1 tablet (100 mg total) by mouth 2 (two) times daily. 60 tablet 5   cetirizine (ZYRTEC) 10 MG tablet Take 10 mg by mouth at bedtime.      CHOLECALCIFEROL PO Take 5,000 Units by mouth 2 (two) times daily.     dalfampridine 10 MG TB12 TAKE 1 TABLET BY MOUTH  EVERY 12 HOURS 60 tablet 5   DODEX 1000 MCG/ML injection INJECT 1 ML INTO THE SKIN EVERY 14 DAYS 10 mL 2   ELIQUIS 5 MG TABS tablet TAKE 1 TABLET(5 MG) BY MOUTH TWICE DAILY 120 tablet 0   escitalopram (LEXAPRO) 10 MG tablet TAKE 1 TABLET BY MOUTH AT  BEDTIME 90 tablet 1   fenofibrate 160 MG  tablet TAKE 1 TABLET BY MOUTH AT  BEDTIME 30 tablet 0   ferrous sulfate 324 MG TBEC Take 324 mg by mouth.     gabapentin (NEURONTIN) 100 MG capsule TAKE 3 CAPSULES(300 MG) BY MOUTH AT BEDTIME 90 capsule 0   Insulin Syringe 27G X 1/2" 1 ML MISC 1 each by Does not apply route every 14 (fourteen) days. For [place] B12 injection 24 each 1   levonorgestrel (MIRENA) 20 MCG/24HR IUD 1 each by Intrauterine route once. Implanted April 2017     NON FORMULARY Take 1 each by mouth at bedtime. CBD/ cannabis     scopolamine (TRANSDERM-SCOP) 1 MG/3DAYS Place 1 patch (1.5 mg total) onto the skin every 3 (  three) days. 4 patch 0   ustekinumab (STELARA) 90 MG/ML SOSY injection Inject 1 mL (90 mg total) into the skin every 8 (eight) weeks. 1 mL 5   No current facility-administered medications for this visit.    Family History  Problem Relation Age of Onset   Hypertension Mother    Breast cancer Mother 46   Bone cancer Mother        metastatic from breast   Depression Mother    Hypothyroidism Mother    Colon polyps Father    Diabetes Father        borderline--resolved 2020   Hypertension Father        off meds now   Hyperlipidemia Father        off meds now   Fuch's dystrophy Father    Multiple sclerosis Sister        affecting brain only   Alcohol abuse Sister    Fuch's dystrophy Sister    Hypertension Sister    Heart disease Sister        poss MI in 44's   Stroke Maternal Grandmother    Lung cancer Maternal Grandmother    Hypertension Maternal Grandmother    Heart disease Paternal Grandmother    Hypertension Paternal Grandmother    Colon cancer Paternal Grandfather    Stomach cancer Neg Hx    Esophageal cancer Neg Hx    Pancreatic cancer Neg Hx     Review of Systems  All other systems reviewed and are negative.   Exam:   BP 108/62   Pulse 89   Ht 5' 4"  (1.626 m)   Wt 202 lb (91.6 kg)   SpO2 98%   BMI 34.67 kg/m     General appearance: alert, cooperative and appears stated age Head:  normocephalic, without obvious abnormality, atraumatic Neck: no adenopathy, supple, symmetrical, trachea midline and thyroid normal to inspection and palpation Lungs: clear to auscultation bilaterally Breasts: normal appearance, no masses or tenderness, No nipple retraction or dimpling, No nipple discharge or bleeding, No axillary adenopathy Heart: regular rate and rhythm Abdomen: ileostomy bag present.  Abdomen is soft, non-tender; no masses, no organomegaly Extremities: extremities normal, atraumatic, no cyanosis or edema Skin: skin color, texture, turgor normal. No rashes or lesions Lymph nodes: cervical, supraclavicular, and axillary nodes normal. Neurologic: grossly normal  Pelvic: External genitalia:  no lesions              No abnormal inguinal nodes palpated.              Urethra:  normal appearing urethra with no masses, tenderness or lesions              Bartholins and Skenes: normal                 Vagina: normal appearing vagina with normal color and discharge, no lesions              Cervix: no lesions.  IUD strings noted.               Pap taken: yes Bimanual Exam:  Uterus:  normal size, contour, position, consistency, mobility, non-tender              Adnexa: no mass, fullness, tenderness              Rectal exam:  declines.   Chaperone was present for exam:  Estill Bamberg, CMA  Assessment:   Well woman visit with gynecologic exam. Mirena IUD.  Hx  LEEP, 2009.  Hx DVT 2019 upper extremity.  Hx IVC filter.   On Eliquis. Ileostomy.  Crohn's. MS.  Plan: Mammogram screening discussed. Self breast awareness reviewed. Pap and HR HPV collected.  Pap and HR HPV testing every 3 years for 25 years following LEEP.  I encouraged increasing physical activity.  Labs with medical health care team. Follow up annually and prn.   After visit summary provided.

## 2022-06-16 ENCOUNTER — Telehealth: Payer: Self-pay | Admitting: Pharmacy Technician

## 2022-06-16 ENCOUNTER — Encounter: Payer: Self-pay | Admitting: Hematology

## 2022-06-16 ENCOUNTER — Encounter: Payer: Self-pay | Admitting: Internal Medicine

## 2022-06-16 ENCOUNTER — Other Ambulatory Visit (HOSPITAL_COMMUNITY): Payer: Self-pay

## 2022-06-16 NOTE — Telephone Encounter (Signed)
Patient Advocate Encounter  Received notification from [facility] [place] that prior authorization for [facility] 90MG is required.   PA submitted on 8.16.23 Key [id number] Status is pending    Luciano Cutter, CPhT Patient Advocate Phone: [phone]

## 2022-06-16 NOTE — Telephone Encounter (Signed)
PA has been submitted and telephone encounter has been created.

## 2022-06-16 NOTE — Telephone Encounter (Signed)
Patient Advocate Encounter  Received notification from [facility] that prior authorization for [facility] 90MG is required.   PA submitted on 8.16.23 Key [id number] Status is pending    Luciano Cutter, CPhT Patient Advocate Phone: [phone]

## 2022-06-17 ENCOUNTER — Other Ambulatory Visit: Payer: Self-pay | Admitting: Neurology

## 2022-06-17 NOTE — Telephone Encounter (Signed)
ERROR

## 2022-06-18 ENCOUNTER — Other Ambulatory Visit (HOSPITAL_COMMUNITY): Payer: Self-pay

## 2022-06-18 NOTE — Telephone Encounter (Signed)
Please see notes below.

## 2022-06-18 NOTE — Telephone Encounter (Addendum)
Patient Advocate Encounter  Prior Authorization for [facility] has been approved.    PA# [id number] Effective dates: 8.17.23 through 8.16.24  Malka Bocek B. CPhT P: [phone] F: [phone]  ALT PA was submitted for approval. Pt will need to you [facility] for fills. [phone]   Received a fax regarding Prior Authorization from [facility] for [facility]. Authorization has been DENIED because PT IS NOT ESTABLISHED/STARTED THERAPY WITH STELARA.

## 2022-06-22 ENCOUNTER — Ambulatory Visit (INDEPENDENT_AMBULATORY_CARE_PROVIDER_SITE_OTHER): Payer: 59 | Admitting: Obstetrics and Gynecology

## 2022-06-22 ENCOUNTER — Other Ambulatory Visit (HOSPITAL_COMMUNITY)
Admission: RE | Admit: 2022-06-22 | Discharge: 2022-06-22 | Disposition: A | Discharge status: Home / Self Care | Payer: 59 | Source: Ambulatory Visit | Attending: Obstetrics and Gynecology | Admitting: Obstetrics and Gynecology

## 2022-06-22 ENCOUNTER — Encounter: Payer: Self-pay | Admitting: Obstetrics and Gynecology

## 2022-06-22 VITALS — BP 108/62 | HR 89 | Ht 64.0 in | Wt 202.0 lb

## 2022-06-22 DIAGNOSIS — Z124 Encounter for screening for malignant neoplasm of cervix: Secondary | ICD-10-CM | POA: Insufficient documentation

## 2022-06-22 DIAGNOSIS — Z01419 Encounter for gynecological examination (general) (routine) without abnormal findings: Secondary | ICD-10-CM

## 2022-06-22 NOTE — Patient Instructions (Signed)

## 2022-06-23 ENCOUNTER — Ambulatory Visit: Payer: 59 | Admitting: Neurology

## 2022-06-23 ENCOUNTER — Encounter: Payer: Self-pay | Admitting: Internal Medicine

## 2022-06-23 ENCOUNTER — Encounter: Payer: Self-pay | Admitting: Hematology

## 2022-06-25 LAB — CYTOLOGY - PAP
Comment: NEGATIVE
Diagnosis: NEGATIVE
High risk HPV: NEGATIVE

## 2022-07-06 ENCOUNTER — Other Ambulatory Visit: Payer: 59

## 2022-07-06 ENCOUNTER — Ambulatory Visit: Payer: 59

## 2022-07-07 ENCOUNTER — Encounter: Payer: Self-pay | Admitting: Internal Medicine

## 2022-07-09 ENCOUNTER — Other Ambulatory Visit: Payer: 59

## 2022-07-09 ENCOUNTER — Other Ambulatory Visit: Payer: Self-pay | Admitting: *Deleted

## 2022-07-09 DIAGNOSIS — E538 Deficiency of other specified B group vitamins: Secondary | ICD-10-CM

## 2022-07-09 DIAGNOSIS — D509 Iron deficiency anemia, unspecified: Secondary | ICD-10-CM

## 2022-07-11 ENCOUNTER — Other Ambulatory Visit: Payer: Self-pay | Admitting: Family Medicine

## 2022-07-11 DIAGNOSIS — Z7901 Long term (current) use of anticoagulants: Secondary | ICD-10-CM

## 2022-07-11 DIAGNOSIS — Z86711 Personal history of pulmonary embolism: Secondary | ICD-10-CM

## 2022-07-12 ENCOUNTER — Telehealth: Payer: Self-pay | Admitting: *Deleted

## 2022-07-12 ENCOUNTER — Inpatient Hospital Stay: Payer: 59 | Attending: Hematology

## 2022-07-12 ENCOUNTER — Encounter: Payer: Self-pay | Admitting: Internal Medicine

## 2022-07-12 ENCOUNTER — Other Ambulatory Visit: Payer: Self-pay | Admitting: Family Medicine

## 2022-07-12 DIAGNOSIS — Z86711 Personal history of pulmonary embolism: Secondary | ICD-10-CM

## 2022-07-12 DIAGNOSIS — E538 Deficiency of other specified B group vitamins: Secondary | ICD-10-CM | POA: Insufficient documentation

## 2022-07-12 DIAGNOSIS — D509 Iron deficiency anemia, unspecified: Secondary | ICD-10-CM | POA: Diagnosis present

## 2022-07-12 DIAGNOSIS — Z7901 Long term (current) use of anticoagulants: Secondary | ICD-10-CM

## 2022-07-12 DIAGNOSIS — E781 Pure hyperglyceridemia: Secondary | ICD-10-CM

## 2022-07-12 LAB — CBC WITH DIFFERENTIAL (CANCER CENTER ONLY)
Abs Immature Granulocytes: 0.02 10*3/uL (ref 0.00–0.07)
Basophils Absolute: 0 10*3/uL (ref 0.0–0.1)
Basophils Relative: 0 %
Eosinophils Absolute: 0.2 10*3/uL (ref 0.0–0.5)
Eosinophils Relative: 2 %
HCT: 41 % (ref 36.0–46.0)
Hemoglobin: 13.9 g/dL (ref 12.0–15.0)
Immature Granulocytes: 0 %
Lymphocytes Relative: 13 %
Lymphs Abs: 1.3 10*3/uL (ref 0.7–4.0)
MCH: 30.4 pg (ref 26.0–34.0)
MCHC: 33.9 g/dL (ref 30.0–36.0)
MCV: 89.7 fL (ref 80.0–100.0)
Monocytes Absolute: 0.7 10*3/uL (ref 0.1–1.0)
Monocytes Relative: 8 %
Neutro Abs: 7.3 10*3/uL (ref 1.7–7.7)
Neutrophils Relative %: 77 %
Platelet Count: 409 10*3/uL — ABNORMAL HIGH (ref 150–400)
RBC: 4.57 MIL/uL (ref 3.87–5.11)
RDW: 13 % (ref 11.5–15.5)
WBC Count: 9.5 10*3/uL (ref 4.0–10.5)
nRBC: 0 % (ref 0.0–0.2)

## 2022-07-12 LAB — CMP (CANCER CENTER ONLY)
ALT: 45 U/L — ABNORMAL HIGH (ref 0–44)
AST: 27 U/L (ref 15–41)
Albumin: 3.9 g/dL (ref 3.5–5.0)
Alkaline Phosphatase: 104 U/L (ref 38–126)
Anion gap: 6 (ref 5–15)
BUN: 14 mg/dL (ref 6–20)
CO2: 27 mmol/L (ref 22–32)
Calcium: 9.1 mg/dL (ref 8.9–10.3)
Chloride: 103 mmol/L (ref 98–111)
Creatinine: 0.92 mg/dL (ref 0.44–1.00)
GFR, Estimated: >60 mL/min (ref >60)
Glucose, Bld: 106 mg/dL — ABNORMAL HIGH (ref 70–99)
Potassium: 3.7 mmol/L (ref 3.5–5.1)
Sodium: 136 mmol/L (ref 135–145)
Total Bilirubin: 0.3 mg/dL (ref 0.3–1.2)
Total Protein: 7.3 g/dL (ref 6.5–8.1)

## 2022-07-12 LAB — IRON AND IRON BINDING CAPACITY (CC-WL,HP ONLY)
Iron: 67 ug/dL (ref 28–170)
Saturation Ratios: 12 % (ref 10.4–31.8)
TIBC: 550 ug/dL — ABNORMAL HIGH (ref 250–450)
UIBC: 483 ug/dL — ABNORMAL HIGH (ref 148–442)

## 2022-07-12 LAB — VITAMIN B12: Vitamin B-12: 372 pg/mL (ref 180–914)

## 2022-07-12 NOTE — Telephone Encounter (Signed)
Called patient and she said Dr. Irene Limbo no longer does and that we have been filling. Dr. Irene Limbo no longer manages, this is now considered a maintenance medication.

## 2022-07-12 NOTE — Telephone Encounter (Signed)
I want her to get a flu shot. I've also been recommending her to get a pneumovax booster (she had declined at her physical).  Needs lipids.  Other labs checked by other specialists. Order placed.

## 2022-07-12 NOTE — Telephone Encounter (Signed)
That's fine, but she may want to schedule a nurse visit when the timing is right, to ensure that she gets these vaccines.

## 2022-07-12 NOTE — Telephone Encounter (Signed)
Spoke with patient and changed visit on the 18th to virtual. Scheduled her for lipids next week 07/15/22 @ 8:15am. She is unable to do flu/pneumo at lab visit. She is starting Stelara on Friday. She said it is a biologic and she was told to wait 4 weeks before getting any vaccines.

## 2022-07-12 NOTE — Telephone Encounter (Signed)
Patient has med check 07/19/22 @ 3:45pm. She would like to know if that can be changed to a virtual and if so, can she come in for any labs that need to be done prior? I can schedule and order if you let me know what is needed. Thanks.

## 2022-07-12 NOTE — Telephone Encounter (Signed)
Not sure what the story with this medication is.  I thought she got from Dr. Irene Limbo but we filled it a few times. If she no longer sees Dr. Irene Limbo, I'm happy to take this over.  If she prefers for [place] to rx, that's fine, as long as any changes regarding this medication come from Dr. Irene Limbo

## 2022-07-12 NOTE — Telephone Encounter (Signed)
Is this something we refill or Dr. Irene Limbo?

## 2022-07-13 ENCOUNTER — Emergency Department (HOSPITAL_BASED_OUTPATIENT_CLINIC_OR_DEPARTMENT_OTHER)
Admission: EM | Admit: 2022-07-13 | Discharge: 2022-07-13 | Disposition: A | Discharge status: Home / Self Care | Payer: 59 | Attending: Emergency Medicine | Admitting: Emergency Medicine

## 2022-07-13 ENCOUNTER — Encounter (HOSPITAL_BASED_OUTPATIENT_CLINIC_OR_DEPARTMENT_OTHER): Payer: Self-pay | Admitting: *Deleted

## 2022-07-13 ENCOUNTER — Inpatient Hospital Stay (HOSPITAL_BASED_OUTPATIENT_CLINIC_OR_DEPARTMENT_OTHER): Payer: 59 | Admitting: Hematology

## 2022-07-13 ENCOUNTER — Emergency Department (HOSPITAL_BASED_OUTPATIENT_CLINIC_OR_DEPARTMENT_OTHER): Payer: 59

## 2022-07-13 ENCOUNTER — Other Ambulatory Visit: Payer: Self-pay

## 2022-07-13 DIAGNOSIS — K469 Unspecified abdominal hernia without obstruction or gangrene: Secondary | ICD-10-CM

## 2022-07-13 DIAGNOSIS — J45909 Unspecified asthma, uncomplicated: Secondary | ICD-10-CM | POA: Diagnosis not present

## 2022-07-13 DIAGNOSIS — E538 Deficiency of other specified B group vitamins: Secondary | ICD-10-CM

## 2022-07-13 DIAGNOSIS — R630 Anorexia: Secondary | ICD-10-CM | POA: Insufficient documentation

## 2022-07-13 DIAGNOSIS — R1011 Right upper quadrant pain: Secondary | ICD-10-CM | POA: Insufficient documentation

## 2022-07-13 DIAGNOSIS — Z7901 Long term (current) use of anticoagulants: Secondary | ICD-10-CM | POA: Diagnosis not present

## 2022-07-13 DIAGNOSIS — R109 Unspecified abdominal pain: Secondary | ICD-10-CM

## 2022-07-13 DIAGNOSIS — Z794 Long term (current) use of insulin: Secondary | ICD-10-CM | POA: Insufficient documentation

## 2022-07-13 DIAGNOSIS — D509 Iron deficiency anemia, unspecified: Secondary | ICD-10-CM | POA: Diagnosis not present

## 2022-07-13 LAB — URINALYSIS, ROUTINE W REFLEX MICROSCOPIC
Bilirubin Urine: NEGATIVE
Glucose, UA: NEGATIVE mg/dL
Hgb urine dipstick: NEGATIVE
Ketones, ur: NEGATIVE mg/dL
Leukocytes,Ua: NEGATIVE
Nitrite: NEGATIVE
Specific Gravity, Urine: 1.031 — ABNORMAL HIGH (ref 1.005–1.030)
pH: 5.5 (ref 5.0–8.0)

## 2022-07-13 LAB — COMPREHENSIVE METABOLIC PANEL
ALT: 41 U/L (ref 0–44)
AST: 27 U/L (ref 15–41)
Albumin: 4.2 g/dL (ref 3.5–5.0)
Alkaline Phosphatase: 102 U/L (ref 38–126)
Anion gap: 9 (ref 5–15)
BUN: 16 mg/dL (ref 6–20)
CO2: 26 mmol/L (ref 22–32)
Calcium: 9.2 mg/dL (ref 8.9–10.3)
Chloride: 103 mmol/L (ref 98–111)
Creatinine, Ser: 0.98 mg/dL (ref 0.44–1.00)
GFR, Estimated: >60 mL/min (ref >60)
Glucose, Bld: 82 mg/dL (ref 70–99)
Potassium: 4.2 mmol/L (ref 3.5–5.1)
Sodium: 138 mmol/L (ref 135–145)
Total Bilirubin: 0.4 mg/dL (ref 0.3–1.2)
Total Protein: 7.6 g/dL (ref 6.5–8.1)

## 2022-07-13 LAB — CBC
HCT: 41.6 % (ref 36.0–46.0)
Hemoglobin: 14.1 g/dL (ref 12.0–15.0)
MCH: 30.5 pg (ref 26.0–34.0)
MCHC: 33.9 g/dL (ref 30.0–36.0)
MCV: 89.8 fL (ref 80.0–100.0)
Platelets: 411 10*3/uL — ABNORMAL HIGH (ref 150–400)
RBC: 4.63 MIL/uL (ref 3.87–5.11)
RDW: 13.2 % (ref 11.5–15.5)
WBC: 8.9 10*3/uL (ref 4.0–10.5)
nRBC: 0 % (ref 0.0–0.2)

## 2022-07-13 LAB — LIPASE, BLOOD: Lipase: 50 U/L (ref 11–51)

## 2022-07-13 LAB — PREGNANCY, URINE: Preg Test, Ur: NEGATIVE

## 2022-07-13 LAB — FERRITIN: Ferritin: 56 ng/mL (ref 11–307)

## 2022-07-13 MED ORDER — CYANOCOBALAMIN 1000 MCG/ML IJ SOLN: INTRAMUSCULAR | 4 refills | Status: DC

## 2022-07-13 MED ORDER — INSULIN PEN NEEDLE 33G X 5 MM MISC: 1.0000 | 2 refills | Status: AC

## 2022-07-13 MED ORDER — FENTANYL CITRATE PF 50 MCG/ML IJ SOSY
50.0000 ug | PREFILLED_SYRINGE | Freq: Once | INTRAMUSCULAR | Status: AC
  Administered 2022-07-13: 50 ug via INTRAVENOUS
  Filled 2022-07-13: qty 1

## 2022-07-13 MED ORDER — LACTATED RINGERS IV BOLUS
1000.0000 mL | Freq: Once | INTRAVENOUS | Status: AC
  Administered 2022-07-13: 1000 mL via INTRAVENOUS

## 2022-07-13 MED ORDER — IOHEXOL 350 MG/ML SOLN
100.0000 mL | Freq: Once | INTRAVENOUS | Status: AC | PRN
  Administered 2022-07-13: 75 mL via INTRAVENOUS

## 2022-07-13 MED ORDER — OXYCODONE HCL 5 MG PO TABS
5.0000 mg | ORAL_TABLET | ORAL | Status: AC
  Administered 2022-07-13: 5 mg via ORAL
  Filled 2022-07-13: qty 1

## 2022-07-13 MED ORDER — ACETAMINOPHEN 325 MG PO TABS
650.0000 mg | ORAL_TABLET | Freq: Once | ORAL | Status: AC
  Administered 2022-07-13: 650 mg via ORAL
  Filled 2022-07-13: qty 2

## 2022-07-13 NOTE — ED Notes (Signed)
Patient transported to CT 

## 2022-07-13 NOTE — Progress Notes (Signed)
HEMATOLOGY/ONCOLOGY PHONE VISIT NOTE  Date of Service: 07/13/2022   Patient Care Team: Rita Ohara, MD as PCP - General (Family Medicine) Pieter Partridge, DO as Consulting Physician (Neurology) Yisroel Ramming, Everardo All, MD as Consulting Physician (Obstetrics and Gynecology) Brunetta Genera, MD as Consulting Physician (Oncology)  CHIEF COMPLAINTS/PURPOSE OF CONSULTATION:  Follow-up for management of iron deficiency  HISTORY OF PRESENTING ILLNESS:   Please see previous note for details on initial presentation  INTERVAL HISTORY  I connected with Ileene Hutchinson on 07/13/2022 at 8:40 AM PM EST by telephone visit and verified that I am speaking with the correct person using two identifiers.   I discussed the limitations, risks, security and privacy concerns of performing an evaluation and management service by telemedicine and the availability of in-person appointments. I also discussed with the patient that there may be a patient responsible charge related to this service. The patient expressed understanding and agreed to proceed.   Other persons participating in the visit and their role in the encounter: None  Patient's location: Home Provider's location: [place] is a 46 y.o. female who was contacted via phone for follow-up for management of iron deficiency anemia. She reports She reports She is doing well with no new symptoms or concerns.  She is taking iron polysaccharide 5 mL 1x p.o daily and tolerating it well.  She has been tolerating her vitamin B12 1000 ug shots well with no prohibitive toxicities at this time.  She reports that her Crohn's is not currently active.  She notes she was started on Stelara for treatment of her arthritis.  She notes steady energy levels. No black or bloody stools. No nausea or diarrhea. No other new or acute focal symptoms.  Labs done today were reviewed in detail.  MEDICAL HISTORY:  Past  Medical History:  Diagnosis Date   Anemia    related to Crohns flares   Arm DVT (deep venous thromboembolism), acute, right ([place]) 11/29/2017   Arthritis    knees, feet, hands, wrists, related to Crohns flare   Asthma    Childhood   B12 deficiency    monitored/treated by Dr. Carlean Purl   Cervical dysplasia    Clotting disorder ([place])    on eliquis hx dvt/ pulmonary embolism   COVID 04/13/2021   Crohn disease ([place])    Crohn's disease of small intestine ([place]) 06/24/2012   Diagnosed 1999, in [place]. Ileitis only then. Treated with Imuran Remicade and prednisone.  Noncompliant with therapy. 2004 return to care and was treated with Remicade prednisone Pentasa Cipro and Flagyl. 2006 status post right hemicolectomy. Subsequently treated with azathioprine and Cimzia. 200 mg every other week.  azathioprine was added in 2010. Prometheus TP MT enzyme was negative.   Depression    DVT of upper extremity (deep vein thrombosis) ([place]) 11/28/2017   Eczema    arms and behind knees, worse in winter   Generalized anxiety disorder    History of recurrent UTI (urinary tract infection)    HPV (human papilloma virus) infection    Hyperlipemia    Hypertriglyceridemia 07/03/2012   10/2010 labs showed level of 753 with total cholesterol 180 and HDL 41' Labs 05/2013--TG 180    Major depressive disorder in remission ([place]) 10/03/2013   MS (multiple sclerosis) ([place])    Multiple sclerosis ([place]) 2019   Officially diagnosed in the Fall   Neuromuscular disorder ([place])    Osteopenia 09/29/2011   T-1.6  Papanicolaou smear 10/2011   last abnormal 2011   Pulmonary embolism ([place]) 11/30/2017   Scaphoid fracture of wrist 2013   left   Seasonal allergic rhinitis    Shingles 09/2015   R hip   Vitamin B12 deficiency 06/19/2012   Vitamin D deficiency 12/16/2015   Vitamin D-OH level 12     SURGICAL HISTORY: Past Surgical History:  Procedure Laterality Date   APPENDECTOMY     CERVICAL BIOPSY  W/ LOOP ELECTRODE  EXCISION  11/02/2007   ---paps normal since   COLONOSCOPY  multiple   scanned   ESOPHAGOGASTRODUODENOSCOPY  multiple   scanned   HEMICOLECTOMY  11/01/2004   ILEOCECETOMY  12/11/2021   and end ileostomy; at [facility]  12/03/2017   IR ANGIOGRAM SELECTIVE EACH ADDITIONAL VESSEL  12/03/2017   IR ANGIOGRAM SELECTIVE EACH ADDITIONAL VESSEL  12/03/2017   IR INFUSION THROMBOL ARTERIAL INITIAL (MS)  12/03/2017   IR INFUSION THROMBOL ARTERIAL INITIAL (MS)  12/03/2017   IR IVC FILTER PLMT / S&I /IMG GUID/MOD SED  12/04/2017   IR THROMB F/U EVAL ART/VEN FINAL DAY (MS)  12/04/2017   IR US GUIDE VASC ACCESS RIGHT  12/03/2017   IVC FILTER REMOVAL N/A 07/11/2018   Procedure: IVC FILTER REMOVAL;  Surgeon: Serafina Mitchell, MD;  Location: [facility];  Service: Cardiovascular;  Laterality: N/A;   IVC VENOGRAPHY N/A 07/11/2018   Procedure: IVC Venography;  Surgeon: Serafina Mitchell, MD;  Location: [facility];  Service: Cardiovascular;  Laterality: N/A;   LEEP  11/01/2009   --done in [place] Right 11/30/2017   Procedure: PERIPHERAL VASCULAR BALLOON ANGIOPLASTY;  Surgeon: Serafina Mitchell, MD;  Location: [facility];  Service: Cardiovascular;  Laterality: Right;   PERIPHERAL VASCULAR THROMBECTOMY N/A 11/29/2017   Procedure: PERIPHERAL VASCULAR THROMBECTOMY - THROMBOLYSIS;  Surgeon: Serafina Mitchell, MD;  Location: [facility];  Service: Cardiovascular;  Laterality: N/A;  LYSIS CATHETER PLACEMENT ONLY   PERIPHERAL VASCULAR THROMBECTOMY N/A 11/30/2017   Procedure: PERIPHERAL VASCULAR THROMBECTOMY - Lysis Recheck;  Surgeon: Serafina Mitchell, MD;  Location: [facility];  Service: Cardiovascular;  Laterality: N/A;   WISDOM TOOTH EXTRACTION      SOCIAL HISTORY: Social History   Socioeconomic History   Marital status: Married    Spouse name: Lelan Pons   Number of children: 0   Years of  education: 14   Highest education level: Associate degree: occupational, Hotel manager, or vocational program  Occupational History   Occupation: Hydrographic surveyor: Theme park manager  Tobacco Use   Smoking status: Former    Packs/day: 1.00    Years: 4.00    Total pack years: 4.00    Types: Cigarettes    Quit date: 11/18/1998    Years since quitting: 23.6   Smokeless tobacco: Never  Vaping Use   Vaping Use: Never used  Substance and Sexual Activity   Alcohol use: Not Currently   Drug use: Yes    Frequency: 4.0 times per week    Types: Marijuana    Comment: smokes some at bedtime 3-4 nights/week (helps with spasms)   Sexual activity: Yes    Partners: Male    Birth control/protection: I.U.D.    Comment: Mirena inserted 01/2016  Other Topics Concern   Not on file  Social History Narrative   The patient is divorced.     Re-married Marcello Moores, partner  of 10 years, on 10/10/16. 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   Moved from [place] to [place] in 2013.   Past smoker   No alcohol   2-3 caffeinated beverages a day   She reports she is compliant with sunscreen given her increased risk of sun damage and skin cancer (no longer on azathioprine)      Updated 12/31/21   Social Determinants of Health   Financial Resource Strain: Not on file  Food Insecurity: Not on file  Transportation Needs: Not on file  Physical Activity: Not on file  Stress: Not on file  Social Connections: Not on file  Intimate Partner Violence: Not on file    FAMILY HISTORY: Family History  Problem Relation Age of Onset   Hypertension Mother    Breast cancer Mother 56   Bone cancer Mother        metastatic from breast   Depression Mother    Hypothyroidism Mother    Colon polyps Father    Diabetes Father        borderline--resolved 2020   Hypertension Father        off meds now   Hyperlipidemia Father        off meds now   Fuch's  dystrophy Father    Multiple sclerosis Sister        affecting brain only   Alcohol abuse Sister    Fuch's dystrophy Sister    Hypertension Sister    Heart disease Sister        poss MI in 77's   Stroke Maternal Grandmother    Lung cancer Maternal Grandmother    Hypertension Maternal Grandmother    Heart disease Paternal Grandmother    Hypertension Paternal Grandmother    Colon cancer Paternal Grandfather    Stomach cancer Neg Hx    Esophageal cancer Neg Hx    Pancreatic cancer Neg Hx     ALLERGIES:  is allergic to iron, iron sucrose, ibuprofen, morphine and related, ofatumumab, adhesive [tape], and prednisone.  MEDICATIONS:  Current Outpatient Medications  Medication Sig Dispense Refill   acetaminophen (TYLENOL) 500 MG tablet Take 1,000 mg by mouth every 6 (six) hours as needed for headache (pain). Reported on 12/15/2015     apixaban (ELIQUIS) 5 MG TABS tablet TAKE 1 TABLET(5 MG) BY MOUTH TWICE DAILY 180 tablet 1   baclofen (LIORESAL) 10 MG tablet TAKE 1 TABLET(10 MG) BY MOUTH THREE TIMES DAILY AS NEEDED FOR MUSCLE SPASMS 90 tablet 5   buPROPion (WELLBUTRIN) 100 MG tablet Take 1 tablet (100 mg total) by mouth 2 (two) times daily. 60 tablet 5   cetirizine (ZYRTEC) 10 MG tablet Take 10 mg by mouth at bedtime.      CHOLECALCIFEROL PO Take 5,000 Units by mouth 2 (two) times daily.     dalfampridine 10 MG TB12 TAKE 1 TABLET BY MOUTH  EVERY 12 HOURS 60 tablet 5   DODEX 1000 MCG/ML injection INJECT 1 ML INTO THE SKIN EVERY 14 DAYS 10 mL 2   escitalopram (LEXAPRO) 10 MG tablet TAKE 1 TABLET BY MOUTH AT  BEDTIME 90 tablet 1   fenofibrate 160 MG tablet TAKE 1 TABLET BY MOUTH AT  BEDTIME 30 tablet 0   ferrous sulfate 324 MG TBEC Take 324 mg by mouth.     gabapentin (NEURONTIN) 100 MG capsule TAKE 3 CAPSULES(300 MG) BY MOUTH AT BEDTIME 90 capsule 0   Insulin  Syringe 27G X 1/2" 1 ML MISC 1 each by Does not apply route every 14 (fourteen) days. For [place] B12 injection 24 each 1   levonorgestrel  (MIRENA) 20 MCG/24HR IUD 1 each by Intrauterine route once. Implanted April 2017     NON FORMULARY Take 1 each by mouth at bedtime. CBD/ cannabis     scopolamine (TRANSDERM-SCOP) 1 MG/3DAYS Place 1 patch (1.5 mg total) onto the skin every 3 (three) days. 4 patch 0   ustekinumab (STELARA) 90 MG/ML SOSY injection Inject 1 mL (90 mg total) into the skin every 8 (eight) weeks. 1 mL 5   No current facility-administered medications for this visit.    REVIEW OF SYSTEMS:   10 Point review of Systems was done is negative except as noted above.  PHYSICAL EXAMINATION: Telemedicine appointment  LABORATORY DATA:  I have reviewed the data as listed  .    Latest Ref Rng & Units 07/12/2022    4:02 PM 04/16/2022    1:33 PM 03/19/2022    9:58 AM  CBC  WBC 4.0 - 10.5 K/uL 9.5  9.8  10.5   Hemoglobin 12.0 - 15.0 g/dL 13.9  14.7  15.1   Hematocrit 36.0 - 46.0 % 41.0  43.3  45.2   Platelets 150 - 400 K/uL 409  382  419.0    CBC    Component Value Date/Time   WBC 9.5 07/12/2022 1602   WBC 10.5 03/19/2022 0958   RBC 4.57 07/12/2022 1602   HGB 13.9 07/12/2022 1602   HGB 13.4 01/06/2022 0955   HCT 41.0 07/12/2022 1602   HCT 43.5 01/06/2022 0955   PLT 409 (H) 07/12/2022 1602   PLT 588 (H) 01/06/2022 0955   MCV 89.7 07/12/2022 1602   MCV 85 01/06/2022 0955   MCH 30.4 07/12/2022 1602   MCHC 33.9 07/12/2022 1602   RDW 13.0 07/12/2022 1602   RDW 14.7 01/06/2022 0955   LYMPHSABS 1.3 07/12/2022 1602   LYMPHSABS 1.1 01/06/2022 0955   MONOABS 0.7 07/12/2022 1602   EOSABS 0.2 07/12/2022 1602   EOSABS 0.3 01/06/2022 0955   BASOSABS 0.0 07/12/2022 1602   BASOSABS 0.1 01/06/2022 0955       Latest Ref Rng & Units 07/12/2022    4:02 PM 04/16/2022    1:33 PM 03/19/2022    9:58 AM  CMP  Glucose 70 - 99 mg/dL 106  97  75   BUN 6 - 20 mg/dL 14  26  18    Creatinine 0.44 - 1.00 mg/dL 0.92  1.02  1.10   Sodium 135 - 145 mmol/L 136  136  134   Potassium 3.5 - 5.1 mmol/L 3.7  4.4  4.7   Chloride 98 - 111  mmol/L 103  103  100   CO2 22 - 32 mmol/L 27  25  25    Calcium 8.9 - 10.3 mg/dL 9.1  9.5  9.9   Total Protein 6.5 - 8.1 g/dL 7.3  7.8  8.5   Total Bilirubin 0.3 - 1.2 mg/dL 0.3  0.4  0.4   Alkaline Phos 38 - 126 U/L 104  126  102   AST 15 - 41 U/L 27  45  39   ALT 0 - 44 U/L 45  88  49    . Lab Results  Component Value Date   IRON 67 07/12/2022   TIBC 550 (H) 07/12/2022   IRONPCTSAT 12 07/12/2022   (Iron and TIBC)  Lab Results  Component Value Date  FERRITIN 20 04/16/2022     Lupus anticoagulant panel      The value has a corrected status.      No reference range information available      Resulting Lab: [facility]      Comments:           (NOTE)           No lupus anticoagulant was detected  RADIOGRAPHIC STUDIES: I have personally reviewed the radiological images as listed and agreed with the findings in the report. No results found.   ASSESSMENT & PLAN:  Lindsay Mccarthy is a 46 y.o. caucasian female with  1. Acute DVT and Submassive PE No definitive provocation of DVT. PE likely from dislodged DVT after mechanical thrombectomy and balloon venoplasty Has IVC filter placed.  On Eliquis since 12/2017 Non smoker. No recent use of hormonal contracept. Prothrombin gene mutation/Factor V leiden mutation negative, Anti-phoshopholipid ab negative. Jak2 testing results negative. Lupus negative. Based on results, elevated platelets likely had a reactive cause due to inflammation from Crohn's and elevated iron.   There is no height or weight on file to calculate BMI. - obesity could an additional contributing risk factor. 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  2. Elevated platelets  Have been elevated 300-800K for over a year  -likely reactive to iron deficiency anemia and inflammation from Crohn's disease. Platelets have improved in tandem with  improvement in iron levels.  3. Iron Deficiency Anemia . Lab Results  Component Value Date   IRON 67 07/12/2022   TIBC 550 (H) 07/12/2022   IRONPCTSAT 12 07/12/2022   (Iron and TIBC)  Lab Results  Component Value Date   FERRITIN 20 04/16/2022  -on ferrous sulfate BID. Tolerating well -continue PO ironto maintain ferritin levels >50. -if still low in 3 months would consider IV iron replacement.  4. Vitamin B12 deficiency  -seen at 5885 on 01/09/18 labs  -Currently on every 14 days B12 injections with PCP, will continue    5. Crohn's Disease -Mx by Dr. Carlean Purl -On Imuran and Entyvio   PLAN:  -Labs done 07/12/2022 showed normal CBC with a hemoglobin of 13.9 normal WBC count of 9.5k and slightly elevated platelets of 409k CMP stable. Ferritin 56 (up from 20) and iron saturation of 12% Vitamin B12 is 372. -Patient has previously had reaction with IV Venofer with fever and flulike symptoms. (Hasa receive Iron dextran in the past). Also her blood counts are completely normal at this time. -Continue 1000 mcg Vitamin B12 [place] every 2 weeks. -Continue Eliquis -Patient currently has no anemia and her ferritin levels are reasonable with low iron saturation likely in the setting of some element of chronic inflammation related to her Crohn's disease. -She is taking new dosing iron polysaccharide 5 mL 1x p.o daily and tolerating it well. -She has been tolerating her vitamin B12 1000 ug shots well with no prohibitive toxicities at this time.  FOLLOW UP: Phone visit with Dr Irene Limbo in 6 months Labs day prior to phone visit   The total time spent in the appointment was 15 minutes*.  All of the patient's questions were answered with apparent satisfaction. The patient knows to call the clinic with any problems, questions or concerns.   Sullivan Lone MD MS AAHIVMS [facility] Hematology/Oncology Physician [facility]  .*Total Encounter Time as defined by the Centers  for Medicare  and Medicaid Services includes, in addition to the face-to-face time of a patient visit (documented in the note above) non-face-to-face time: obtaining and reviewing outside history, ordering and reviewing medications, tests or procedures, care coordination (communications with other health care professionals or caregivers) and documentation in the medical record.  I, Melene Muller, am acting as scribe for Dr. Sullivan Lone, MD.  I have reviewed the above documentation for accuracy and completeness, and I agree with the above. Brunetta Genera MD

## 2022-07-13 NOTE — ED Provider Notes (Signed)
  Physical Exam  BP 122/82 (BP Location: Right Arm)   Pulse 84   Temp 98 F (36.7 C) (Oral)   Resp 16   Ht 5' 4"  (1.626 m)   Wt 91.6 kg   SpO2 99%   BMI 34.67 kg/m   Physical Exam  Procedures  Procedures  ED Course / MDM   Clinical Course as of 07/14/22 1231  Tue Jul 13, 2022  1453 Assumed care from Dr. Armandina Gemma.  46 year old female with hx of crohn's sp partial colectomy. Has RUQ pain today. Getting RUQ [place] and CT of the abdomen and pelvis that are pending at this time. Dr Drue Flirt at [facility] is her Psychologist, sport and exercise.  [RP]  1532 CT shows small fat containing peristomal hernia. [place] without gallstones.  [RP]  1464 Patient tolerated p.o.  Still having mild pain so we will give Tylenol and dose of oxycodone prescription since she is unable to take NSAIDs.  Her repeat abdominal exam is very reassuring.  We will have the patient follow-up with her surgeon regarding her symptoms but do not feel acute intervention is required at this time.  Patient also informed that she should follow-up with her primary doctor return cautions were discussed with her prior to discharge. [RP]    Clinical Course User Index [RP] Fransico Meadow, MD   Medical Decision Making Amount and/or Complexity of Data Reviewed Labs: ordered. Radiology: ordered.  Risk OTC drugs. Prescription drug management.      Fransico Meadow, MD 07/14/22 [phone]

## 2022-07-13 NOTE — ED Provider Notes (Signed)
[facility] EMERGENCY DEPT Provider Note   CSN: [id number] Arrival date & time: 07/13/22  1218     History  Chief Complaint  Patient presents with   Abdominal Pain    Lindsay Mccarthy is a 46 y.o. female.   Abdominal Pain    46 year old female with medical history significant for asthma, multiple sclerosis, DVT, HLD, Crohn's disease status post bowel obstruction and ileocecal resection with end ileostomy at [facility] in February 2023 who presents to the emergency department with abdominal pain.  Briefly, the patient states that she developed a fit of coughing after choking on soda on Sunday night.  She felt like she was straining significantly and subsequently developed pain next to her stoma.  She is worried she has an internal hernia.  She states that she has had decreased output from her ileostomy.  She endorses right upper quadrant pain.  She denies any current nausea or vomiting.  She has had some decreased oral intake due to the pain with associated decreased ostomy output.  She is passing gas through her ostomy.  She is concerned for dehydration.  Home Medications Prior to Admission medications   Medication Sig Start Date End Date Taking? Authorizing Provider  acetaminophen (TYLENOL) 500 MG tablet Take 1,000 mg by mouth every 6 (six) hours as needed for headache (pain). Reported on 12/15/2015    [provider]  apixaban (ELIQUIS) 5 MG TABS tablet TAKE 1 TABLET(5 MG) BY MOUTH TWICE DAILY 07/12/22   Rita Ohara, MD  baclofen (LIORESAL) 10 MG tablet TAKE 1 TABLET(10 MG) BY MOUTH THREE TIMES DAILY AS NEEDED FOR MUSCLE SPASMS 12/22/21   Tomi Likens, Adam R, DO  buPROPion (WELLBUTRIN) 100 MG tablet Take 1 tablet (100 mg total) by mouth 2 (two) times daily. 05/19/22   Rita Ohara, MD  cetirizine (ZYRTEC) 10 MG tablet Take 10 mg by mouth at bedtime.     [provider]  CHOLECALCIFEROL PO Take 5,000 Units by mouth 2 (two) times daily.    [provider]  cyanocobalamin (DODEX) 1000 MCG/ML injection INJECT 1 ML INTO THE SKIN EVERY 14 DAYS 07/13/22   Brunetta Genera, MD  dalfampridine 10 MG TB12 TAKE 1 TABLET BY MOUTH  EVERY 12 HOURS 01/27/22   Tomi Likens, Adam R, DO  escitalopram (LEXAPRO) 10 MG tablet TAKE 1 TABLET BY MOUTH AT  BEDTIME 03/30/22   Rita Ohara, MD  fenofibrate 160 MG tablet TAKE 1 TABLET BY MOUTH AT  BEDTIME 06/09/22   Rita Ohara, MD  gabapentin (NEURONTIN) 100 MG capsule TAKE 3 CAPSULES(300 MG) BY MOUTH AT BEDTIME 06/18/22   Jaffe, Adam R, DO  Insulin Pen Needle 33G X 5 MM MISC 1 each by Does not apply route every 14 (fourteen) days. 07/13/22   Brunetta Genera, MD  Insulin Syringe 27G X 1/2" 1 ML MISC 1 each by Does not apply route every 14 (fourteen) days. For Lee B12 injection 07/08/20   Brunetta Genera, MD  levonorgestrel [facility]) 20 MCG/24HR IUD 1 each by Intrauterine route once. Implanted April 2017 05/19/11   [provider]  NON FORMULARY Take 1 each by mouth at bedtime. CBD/ cannabis    [provider]  scopolamine (TRANSDERM-SCOP) 1 MG/3DAYS Place 1 patch (1.5 mg total) onto the skin every 3 (three) days. 06/15/22   Rita Ohara, MD  ustekinumab (STELARA) 90 MG/ML SOSY injection Inject 1 mL (90 mg total) into the skin every 8 (eight) weeks. 06/10/22   Silvano Rusk  E, MD      Allergies    Iron, Iron sucrose, Ibuprofen, Morphine and related, Ofatumumab, Adhesive [tape], and Prednisone    Review of Systems   Review of Systems  Gastrointestinal:  Positive for abdominal pain.  All other systems reviewed and are negative.   Physical Exam Updated Vital Signs BP 127/77 (BP Location: Right Arm)   Pulse 78   Temp 97.9 F (36.6 C)   Resp 20   Ht 5' 4"  (1.626 m)   Wt 91.6 kg   SpO2 97%   BMI 34.67 kg/m  Physical Exam Vitals and nursing note reviewed.  Constitutional:      General: She is not in acute distress.    Appearance: She is well-developed.  HENT:     Head: Normocephalic and  atraumatic.  Eyes:     Conjunctiva/sclera: Conjunctivae normal.  Cardiovascular:     Rate and Rhythm: Normal rate and regular rhythm.     Heart sounds: No murmur heard. Pulmonary:     Effort: Pulmonary effort is normal. No respiratory distress.     Breath sounds: Normal breath sounds.  Abdominal:     Palpations: Abdomen is soft.     Tenderness: There is abdominal tenderness in the right upper quadrant. There is guarding. Positive signs include Murphy's sign.     Comments: Ileostomy in place in the right upper quadrant, right upper quadrant tenderness to palpation with a positive Murphy sign, mild guarding present, no rebound tenderness, minimal output noted in the patient's ostomy (patient had recently changed the bag)  Musculoskeletal:        General: No swelling.     Cervical back: Neck supple.  Skin:    General: Skin is warm and dry.     Capillary Refill: Capillary refill takes less than 2 seconds.  Neurological:     Mental Status: She is alert.  Psychiatric:        Mood and Affect: Mood normal.     ED Results / Procedures / Treatments   Labs (all labs ordered are listed, but only abnormal results are displayed) Labs Reviewed  CBC - Abnormal; Notable for the following components:      Result Value   Platelets 411 (*)    All other components within normal limits  URINALYSIS, ROUTINE W REFLEX MICROSCOPIC - Abnormal; Notable for the following components:   Specific Gravity, Urine 1.031 (*)    Protein, ur TRACE (*)    All other components within normal limits  LIPASE, BLOOD  COMPREHENSIVE METABOLIC PANEL  PREGNANCY, URINE    EKG None  Radiology No results found.  Procedures Procedures    Medications Ordered in ED Medications - No data to display  ED Course/ Medical Decision Making/ A&P Clinical Course as of 07/13/22 1538  Tue Jul 13, 2022  1453 Hx of crohn's sp partial colectomy. Has RUQ pain today. Getting RUQ [place] and CT of the abdomen and pelvis that are  pending at this time. [facility] Psychologist, sport and exercise.  [RP]  1532 CT shows small peristomal hernia.  [RP]    Clinical Course User Index [RP] Fransico Meadow, MD                           Medical Decision Making Amount and/or Complexity of Data Reviewed Labs: ordered. Radiology: ordered.  Risk Prescription drug management.    46 year old female with medical history significant for asthma, multiple sclerosis, DVT, HLD,  Crohn's disease status post bowel obstruction and ileocecal resection with end ileostomy at [facility] in February 2023 who presents to the emergency department with abdominal pain.  Briefly, the patient states that she developed a fit of coughing after choking on soda on Sunday night.  She felt like she was straining significantly and subsequently developed pain next to her stoma.  She is worried she has an internal hernia.  She states that she has had decreased output from her ileostomy.  She endorses right upper quadrant pain.  She denies any current nausea or vomiting.  She has had some decreased oral intake due to the pain with associated decreased ostomy output.  She is passing gas through her ostomy.  She is concerned for dehydration.  On arrival, the patient was vitally stable.  NSR noted on cardiac telemetry.  Physical exam significant for Ileostomy in place in the right upper quadrant, right upper quadrant tenderness to palpation with a positive Murphy sign, mild guarding present, no rebound tenderness, minimal output noted in the patient's ostomy (patient had recently changed the bag).  Differential diagnosis includes cholecystitis, cholelithiasis, peptic ulcer disease, small bowel obstruction.  Additionally considered intestinal hernia.  Screening laboratory evaluation ordered to include urine pregnancy, urinalysis, lipase, CBC, CMP.  Ordered right upper quadrant ultrasound and CT abdomen pelvis with contrast.  IV fentanyl and an LR bolus was  administered.  CT and ultrasound imaging was pending at time of signout.  Signout given to Dr. Sharlett Iles at 1500 with a plan to follow-up labs and imaging.  Disposition pending results and reassessment.   Final Clinical Impression(s) / ED Diagnoses Final diagnoses:  None    Rx / DC Orders ED Discharge Orders     None         Regan Lemming, MD 07/13/22 1544

## 2022-07-13 NOTE — ED Triage Notes (Signed)
Pt had bowel resection and illeostomy placed 12/11/2021 and on Sunday she got chocked on soda and coughed and she has pain next to stoma and feels she might have a hernia.  Stoma has been functioning as usual, no nausea or vomiting with this.

## 2022-07-13 NOTE — Discharge Instructions (Signed)
Today you were seen in the emergency department for your abdominal pain.    In the emergency department you had a CT and ultrasound that showed you have a small fat-containing parastomal hernia.    At home, please take Tylenol and oxycodone for pain since you are unable to take NSAIDs.    Follow-up with your primary doctor in 2-3 days regarding your visit.  Follow-up with your surgeon as soon as possible.  Return immediately to the emergency department if you experience any of the following: Worsening pain, vomiting, fevers, changes in your stoma output or color, or any other concerning symptoms.    Thank you for visiting our Emergency Department. It was a pleasure taking care of you today.

## 2022-07-14 ENCOUNTER — Telehealth: Payer: Self-pay

## 2022-07-14 NOTE — Telephone Encounter (Signed)
Transition Care Management Follow-up Telephone Call Date of discharge and from where: [place] How have you been since you were released from the hospital? Stomach still hurts but doing a little better Any questions or concerns? No  Items Reviewed: Did the pt receive and understand the discharge instructions provided? Yes  Medications obtained and verified? Yes  Other? No  Any new allergies since your discharge? No  Dietary orders reviewed? Yes Do you have support at home? Yes   Home Care and Equipment/Supplies: Were home health services ordered? no  Follow up appointments reviewed:  PCP Hospital f/u appt confirmed? No  Pt. Stated no f/u needed with PCP  [facility] f/u appt confirmed? No  pt. Is going to call her general surgeon to schedule a f/u with them per discharge instructions.  Are transportation arrangements needed? No  If their condition worsens, is the pt aware to call PCP or go to the Emergency Dept.? Yes Was the patient provided with contact information for the PCP's office or ED? Yes Was to pt encouraged to call back with questions or concerns? Yes

## 2022-07-15 ENCOUNTER — Encounter: Payer: Self-pay | Admitting: Internal Medicine

## 2022-07-15 ENCOUNTER — Other Ambulatory Visit: Payer: 59

## 2022-07-15 DIAGNOSIS — E781 Pure hyperglyceridemia: Secondary | ICD-10-CM

## 2022-07-15 NOTE — Telephone Encounter (Signed)
Message left - she should let Dr. Drue Flirt know about this and ask for f/u - I asked that she message me back with an update and we will sort out GI clinic f/u

## 2022-07-16 ENCOUNTER — Encounter: Payer: Self-pay | Admitting: Family Medicine

## 2022-07-16 ENCOUNTER — Ambulatory Visit (INDEPENDENT_AMBULATORY_CARE_PROVIDER_SITE_OTHER): Payer: 59

## 2022-07-16 VITALS — BP 103/74 | HR 72 | Temp 98.2°F | Resp 16 | Ht 63.0 in | Wt 207.6 lb

## 2022-07-16 DIAGNOSIS — K50112 Crohn's disease of large intestine with intestinal obstruction: Secondary | ICD-10-CM

## 2022-07-16 LAB — LIPID PANEL
Chol/HDL Ratio: 3.1 ratio (ref 0.0–4.4)
Cholesterol, Total: 171 mg/dL (ref 100–199)
HDL: 55 mg/dL (ref >39)
LDL Chol Calc (NIH): 59 mg/dL (ref 0–99)
Triglycerides: 378 mg/dL — ABNORMAL HIGH (ref 0–149)
VLDL Cholesterol Cal: 57 mg/dL — ABNORMAL HIGH (ref 5–40)

## 2022-07-16 MED ORDER — USTEKINUMAB 130 MG/26ML IV SOLN
520.0000 mg | Freq: Once | INTRAVENOUS | Status: AC
  Administered 2022-07-16: 520 mg via INTRAVENOUS
  Filled 2022-07-16: qty 104

## 2022-07-16 NOTE — Progress Notes (Signed)
Diagnosis: Crohn's Disease  Provider:  Marshell Garfinkel MD  Procedure: Infusion  IV Type: Peripheral, IV Location: L Hand  Stelera (Ustekinumab), Dose: 527m  Infusion Start Time: 10973 Infusion Stop Time: 1618  Post Infusion IV Care: Observation period completed and Peripheral IV Discontinued  Discharge: Condition: Good, Destination: Home . AVS provided to patient.   Performed by:  LAdelina Mings LPN

## 2022-07-17 ENCOUNTER — Ambulatory Visit
Admission: RE | Admit: 2022-07-17 | Discharge: 2022-07-17 | Disposition: A | Discharge status: Home / Self Care | Payer: 59 | Source: Ambulatory Visit | Attending: Neurology | Admitting: Neurology

## 2022-07-17 DIAGNOSIS — G35 Multiple sclerosis: Secondary | ICD-10-CM

## 2022-07-17 MED ORDER — GADOBENATE DIMEGLUMINE 529 MG/ML IV SOLN
20.0000 mL | Freq: Once | INTRAVENOUS | Status: AC | PRN
  Administered 2022-07-17: 20 mL via INTRAVENOUS

## 2022-07-18 NOTE — Progress Notes (Unsigned)
Start time: End time:  Virtual Visit via Video Note  I connected with Lindsay Mccarthy on 07/18/22 by a video enabled telemedicine application and verified that I am speaking with the correct person using two identifiers.  Location: Patient: *** Provider: office   I discussed the limitations of evaluation and management by telemedicine and the availability of in person appointments. The patient expressed understanding and agreed to proceed.  History of Present Illness:  No chief complaint on file.  Patient presents for follow-up on chronic problems. She had labs done prior to her visit, see below.   Depression follow-up: She had a visit 2 months ago to discuss her depression, because she was seeing the Wellbutrin SR tablets passing through in her stool/ostomy. She had noticed being a little more moody, not as level-tempered. We switched her from wellbutrin SR to plain wellbutrin 17m BID.  We discussed option to increase to TID if not adequately controlling her mood (and if didn't tolerate that dose, could switch to 73mTID).  PHQ-9 was 4 at July visit.  She also continues to take Lexapro 1056maily.  Today she reports  Hypertriglyceridemia:  She is compliant with medications (fenofibrate) and denies side effects. TG's have remained above goal.  She has seen nutritionist in the past. She previously took omega-3 fish oil, which were stopped when anticoagulants were started.   She sent a message after seeing her TG results (labs done prior to visit). She states that her diet has been good (much improved from the diet she described at her physical), eats fried/sweet foods once or twice a week. She eats fruit or an egg for breakfast and leftovers or salad for lunch.  She is worried that maybe she isn't properly absorbing the fenofibrate.  Is she back on WW?Pacific Mutual[employer] year she was on Weight Watchers, tried to limit her carbs (potatoes and rolled oats, trying to limit portions).  At that time  she was eating out once a week, healthy choices, no fast food.    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 She denies bleeding or bruising. Denies shortness of breath, chest pain or leg swelling.  Pt is under the care of Dr. 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  She prefers to keep the ostomy, rather than have a takedown. She is on Stelara, with last treatment 9/15.  She recently coughed very hard, had severe pain and was evaluated in the ER. CT 9/12: IMPRESSION: 1. No acute abdominal/pelvic findings, mass lesions or adenopathy. No findings for bowel obstruction. 2. 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  Pain has improved.  Still has a bulge?  Vitamin D deficiency--found to be low at 21 in 09/2018.  She is currently taking 5000 IU twice daily, and had level rechecked 06/2021 and was normal at 58.8.  Last level was 35.66 in 03/2022. DOSE SAME?   MS--under the care of Dr. JafTomi LikensShe had MRI of cervical and thoracic spine over the weekend, results pending. Scheduled for brain MRI 9/23. She has f/u scheduled for next month. She remains on Ampyra, gabapentin and baclofen.  She sometimes notices the Ampyra passing in her bag. Uses an extra 100m32m gabapentin when having more electric shocks.     PMH, PSH, SH reviewed    ROS:    Observations/Objective:  There were no vitals taken for  this visit. Wt Readings from Last 3 Encounters:  07/16/22 207 lb 9.6 oz (94.2 kg)  07/13/22 202 lb (91.6 kg)  06/22/22 202 lb (91.6 kg)     Lab Results  Component Value Date   CHOL 171 07/15/2022   HDL 55 07/15/2022   LDLCALC 59 07/15/2022   TRIG 378 (H) 07/15/2022   CHOLHDL 3.1 07/15/2022   Other recent labs reviewed--normal c-met, cbc (except plt 411), B12 level 372. Normal iron, ferritin in 04/2022     Assessment and  Plan:  PHQ-9 Flu and pneumovax (per Carlean Purl, to wait a week after Stelara, which was 9/15)--SCHEDULE [place] TO GET HERE?    Follow Up Instructions:    I discussed the assessment and treatment plan with the patient. The patient was provided an opportunity to ask questions and all were answered. The patient agreed with the plan and demonstrated an understanding of the instructions.   The patient was advised to call back or seek an in-person evaluation if the symptoms worsen or if the condition fails to improve as anticipated.  I spent *** minutes dedicated to the care of this patient, including pre-visit review of records, face to face time, post-visit ordering of testing and documentation.    Vikki Ports, MD

## 2022-07-19 ENCOUNTER — Encounter: Payer: Self-pay | Admitting: Family Medicine

## 2022-07-19 ENCOUNTER — Telehealth (INDEPENDENT_AMBULATORY_CARE_PROVIDER_SITE_OTHER): Payer: 59 | Admitting: Family Medicine

## 2022-07-19 ENCOUNTER — Ambulatory Visit: Payer: 59 | Admitting: Neurology

## 2022-07-19 ENCOUNTER — Encounter: Payer: Self-pay | Admitting: Internal Medicine

## 2022-07-19 ENCOUNTER — Encounter: Payer: Self-pay | Admitting: Hematology

## 2022-07-19 VITALS — BP 104/72 | HR 84 | Ht 64.0 in | Wt 205.6 lb

## 2022-07-19 DIAGNOSIS — K50012 Crohn's disease of small intestine with intestinal obstruction: Secondary | ICD-10-CM

## 2022-07-19 DIAGNOSIS — G35 Multiple sclerosis: Secondary | ICD-10-CM | POA: Diagnosis not present

## 2022-07-19 DIAGNOSIS — F325 Major depressive disorder, single episode, in full remission: Secondary | ICD-10-CM

## 2022-07-19 DIAGNOSIS — Z7185 Encounter for immunization safety counseling: Secondary | ICD-10-CM

## 2022-07-19 DIAGNOSIS — E781 Pure hyperglyceridemia: Secondary | ICD-10-CM | POA: Diagnosis not present

## 2022-07-19 DIAGNOSIS — Z6835 Body mass index (BMI) 35.0-35.9, adult: Secondary | ICD-10-CM

## 2022-07-19 MED ORDER — FENOFIBRATE 160 MG PO TABS: 160.0000 mg | ORAL_TABLET | Freq: Every day | ORAL | 0 refills | Status: DC

## 2022-07-19 NOTE — Patient Instructions (Signed)
Change your fenofibrate to taking it in the morning. This way you should be able to see if you pass the pill in your bag. If you do see the pill, try cutting or crushing the medication. We can then recheck your triglycerides in 4-6 weeks, and continue your healthy diet.

## 2022-07-22 ENCOUNTER — Other Ambulatory Visit: Payer: Self-pay | Admitting: Family Medicine

## 2022-07-22 DIAGNOSIS — E781 Pure hyperglyceridemia: Secondary | ICD-10-CM

## 2022-07-24 ENCOUNTER — Ambulatory Visit
Admission: RE | Admit: 2022-07-24 | Discharge: 2022-07-24 | Disposition: A | Discharge status: Home / Self Care | Payer: 59 | Source: Ambulatory Visit | Attending: Neurology | Admitting: Neurology

## 2022-07-24 DIAGNOSIS — G35 Multiple sclerosis: Secondary | ICD-10-CM

## 2022-07-24 MED ORDER — GADOBENATE DIMEGLUMINE 529 MG/ML IV SOLN
19.0000 mL | Freq: Once | INTRAVENOUS | Status: AC | PRN
  Administered 2022-07-24: 19 mL via INTRAVENOUS

## 2022-07-27 ENCOUNTER — Telehealth: Payer: Self-pay | Admitting: Family Medicine

## 2022-07-27 DIAGNOSIS — Z7901 Long term (current) use of anticoagulants: Secondary | ICD-10-CM

## 2022-07-27 DIAGNOSIS — Z86711 Personal history of pulmonary embolism: Secondary | ICD-10-CM

## 2022-07-27 NOTE — Telephone Encounter (Signed)
Pharmacy went refill request for eliquis please send to the [facility] mail order service

## 2022-07-28 MED ORDER — APIXABAN 5 MG PO TABS: ORAL_TABLET | ORAL | 1 refills | Status: DC

## 2022-07-28 NOTE — Telephone Encounter (Signed)
Done

## 2022-07-29 ENCOUNTER — Other Ambulatory Visit: Payer: Self-pay | Admitting: Neurology

## 2022-07-29 ENCOUNTER — Other Ambulatory Visit: Payer: Self-pay

## 2022-07-29 MED ORDER — GABAPENTIN 100 MG PO CAPS: ORAL_CAPSULE | ORAL | 0 refills | Status: DC

## 2022-07-29 NOTE — Progress Notes (Signed)
Fax received from [facility] for Gabapentin 100 mg take 3 cap at bedtime.

## 2022-07-30 ENCOUNTER — Other Ambulatory Visit (INDEPENDENT_AMBULATORY_CARE_PROVIDER_SITE_OTHER): Payer: 59

## 2022-07-30 DIAGNOSIS — Z23 Encounter for immunization: Secondary | ICD-10-CM

## 2022-08-04 ENCOUNTER — Other Ambulatory Visit: Payer: Self-pay | Admitting: Family Medicine

## 2022-08-04 DIAGNOSIS — F325 Major depressive disorder, single episode, in full remission: Secondary | ICD-10-CM

## 2022-08-04 NOTE — Progress Notes (Deleted)
cancelled

## 2022-08-09 ENCOUNTER — Other Ambulatory Visit: Payer: Self-pay | Admitting: Neurology

## 2022-08-10 ENCOUNTER — Encounter: Payer: Self-pay | Admitting: Internal Medicine

## 2022-08-23 NOTE — Progress Notes (Signed)
NEUROLOGY FOLLOW UP OFFICE NOTE  ANGELE WIEMANN [id number]  Assessment/Plan:   Multiple sclerosis - due to recent surgery and changes in medication to treat her Crohn's, will defer starting a new DMT for now.  She will be on immunosuppressant therapy for Crohn's anyway.  Compressive cervical myelopathy Bilateral arm/hand numbness - may be radiculopathy vs carpal tunnel syndrome   DMT: Defer Follow up with neurosurgery (scheduled for Nov 2) D3 5000 IU BID, gabapentin 100-27m QHS, Ampyra, baclofen 115mTID PRN Check vit D level She will try wearing wrist splints at night Follow up 6 months.   Subjective:  PaCiarra Braddys a [age]ear old right-handed woman with Crohn's disease, depression, and anxiety who follows up for multiple sclerosis.    UPDATE: Current disease modifying therapy: None Other medication:  D3 5000 IU BID, B12 injections, Ampyra, gabapentin 100 to 20033mt bedtime, baclofen at bedtime, Wellbutrin, Lexapro 70m49mColestid, Bentayl, Eliquis, Mirena  Started Stelara for her Crohn's a few weeks ago.  Now with ostomy.  Does not want to start DMT at this time.  MRIs personally reviewed: 07/24/2022 MRI BRAIN W WO:  1. Unchanged mild T2 hyperintense foci. No new or enhancing lesions to suggest progressive or active demyelination.  2. No acute intracranial process. 07/17/2022 MRI C & T-SPINE W WO:  1. Redemonstrated scattered T2 hyperintense lesions within the cervical and thoracic spinal cord, not significantly changed from prior exam, and compatible with demyelinating disease. No evidence of contrast enhancement to suggest active demyelination.  2. Compared to prior exam there is new T2 hyperintense signal within the central spinal cord a the C4-C5 and T6-T7 levels where there is moderate to severe spinal canal stenosis. This is favored to represent changes related to chronic compressive myelopathy.  Due to findings of worsening stenosis causing compressive  myelopathy, [facility]   Vision:  No issues Motor:  No new issues Sensory:  numbness and tingling in legs.  Notes new numbness of the upper extremities from hands and entire arms when she wakes up in morning. Pain:  Spasms in legs as well as abdomen. Gait:  Gait improved since restarting Ampyra.  Now usually ambulates without cane unless outside. Bowel/Bladder:  Occasional urinary incontinence when stressed.  Crohn's disease. Fatigue:  Some fatigue. Cognition:  Trouble multi-tasking.  She underwent neuropsychological testing on 09/04/2019.  Performance fell largely within normal limits.  She did exhibit a weakness across aspects of attention/concentration and some performance variability across processing speed.  Performance involving complex attention, executive functioning, receptive and expressive language, visual-spatial abilities and verbal and visual learning and memory were within normal range. Mood:  Stable   HISTORY: She reports history of various neurologic symptoms since her late 20s:59s Urinary incontinence:  It started about a year ago and has gotten worse.  It occurs suddenly, once or twice a month, usually at home and while standing, such as while washing dishes with the water running.  It occurs during fatigued or emotional stress as well.  She denies increased urinary frequency or dysuria.  No perineal numbness.  No low back pain.   - She reports poor night vision with difficulty seeing while driving at night. She has trouble focusing.  Recent eye exam showed poor "refocusing", which would normally be seen in an older adult.  Her sister had Fuchs dystrophy but she doesn't have it. -  She reports numbness and tingling in her hands  when she wakes up in the morning or in the evening after work. -  She reports abnormal gait.  It feels like her feet are stuck in cement.  She has trouble lifting her feet.  It causes  hip pain.  She trips over her feet.  It is worse when there is an extreme change in temperature.  She started having trouble in her mid-30s.  Worse over the past 2 years.  She reports a stabbing pain in her hips or knees.  She reports previously being diagnosed with a pinched nerve in the back that caused a left foot drop in the 20s. She had PT and injections which was ineffective.  No numbness in the legs.   -  When she throws a ball, her hand doesn't release immediately.  There is a delay until she is able to release the ball.  She has similar problems with her left hand.  No neck pain. -  She reports fatigued.  She needs to take a nap during the day.  Prior sleep study was normal.   One of her two sisters has multiple sclerosis.   Other history:  She has Crohn's disease.  Earlier this year, she developed a DVT in her right leg.  She also was found to have B12 deficiency and is being treated with injections.   01/09/18 LABS:  B12 143, Sed rate 18.  Hypercoagulable panel unremarkable, including lupus anticoagulant panel, dRVVT mix, beta-2-glycoprotein, cardiolipin antibodies, prothrombin gene mutation, factor V Leiden.  NMO/AQP4+ antibody was negative.  Her insurance would not cover anti-MOG antibody testing.   09/2018 LABS:  JC Virus antibody was positive with index of 3.26.  Quantiferon-TB Gold Plus negative.  She tested negative for Hep B.      Imaging: 07/22/2018:  MRI C & T-SPINE W WO:  Multiple T2/STIR hyperintense signal within the spinal cord at C2, C3-C4, C6-C7, T1, T3-T4, T6-T7, and T12 levels, all non-enhancing.  She also demonstrates degenerative spinal stenosis at C4-C5 and C5-C6 with mild spinal cord mass effect. 07/24/2018:  MRI BRAIN W WO:  Mild hyperintense in the periventricular and juxtacortical white matter with signal running perpendicular from the lateral ventricle and involving the corpus callosum, non-enhancing. 04/05/2020 MRI C & T-SPINE W WO:  Chronic patchy multifocal cord  signal abnormality throughout cervical and thoracic cord, overall stable compared to 2019.  No active lesions.  Degenerative cervical spondylosis at C4-5 and C5-6 with mild to moderate spinal stenosis and moderate left C5 foraminal narrowing. 05/02/2020 MRI BRAIN W WO:  Unchanged mild subcortical/juxtacortical and deep periventricular white matter foci of signal abnormality consistent with the provided history of multiple sclerosis. No new or enhancing lesion is identified. 07/07/2021 MRI BRAIN W WO:  Similar mild T2 white matter hyperintensities, compatible with the reported history of multiple sclerosis. No convincing new or enhancing lesions identified. 07/19/2021 MRI C-SPINE W WO:  1. Multiple T2 hyperintense cord lesions consistent with the clinical diagnosis of multiple sclerosis. No new or enhancing lesion identified.  2. Degenerative changes at C4-5 with mild progression of spinal canal stenosis which is moderate, with mass effect on the cord.  There is also mild-to-moderate right and moderate left neural foraminal narrowing at this level.  Past DMT:  Ocrevus (effective but would develop recurrence of chronic symptoms prior to] next infusion), Kesimpta (possibly caused Crohn's flare)  Past medications:  gabapentin   PAST MEDICAL HISTORY: Past Medical History:  Diagnosis Date   Anemia    related to Crohns flares  Arm DVT (deep venous thromboembolism), acute, right ([place]) 11/29/2017   Arthritis    knees, feet, hands, wrists, related to Crohns flare   Asthma    Childhood   B12 deficiency    monitored/treated by Dr. Carlean Purl   Cervical dysplasia    Clotting disorder ([place])    on eliquis hx dvt/ pulmonary embolism   COVID 04/13/2021   Crohn disease ([place])    Crohn's disease of small intestine ([place]) 06/24/2012   Diagnosed 1999, in [place]. Ileitis only then. Treated with Imuran Remicade and prednisone.  Noncompliant with therapy. 2004 return to care and was treated with Remicade  prednisone Pentasa Cipro and Flagyl. 2006 status post right hemicolectomy. Subsequently treated with azathioprine and Cimzia. 200 mg every other week.  azathioprine was added in 2010. Prometheus TP MT enzyme was negative.   Depression    DVT of upper extremity (deep vein thrombosis) ([place]) 11/28/2017   Eczema    arms and behind knees, worse in winter   Generalized anxiety disorder    History of recurrent UTI (urinary tract infection)    HPV (human papilloma virus) infection    Hyperlipemia    Hypertriglyceridemia 07/03/2012   10/2010 labs showed level of 753 with total cholesterol 180 and HDL 41' Labs 05/2013--TG 180    Major depressive disorder in remission ([place]) 10/03/2013   MS (multiple sclerosis) ([place])    Multiple sclerosis ([place]) 2019   Officially diagnosed in the Fall   Neuromuscular disorder ([place])    Osteopenia 09/29/2011   T-1.6   Papanicolaou smear 10/2011   last abnormal 2011   Pulmonary embolism ([place]) 11/30/2017   Scaphoid fracture of wrist 2013   left   Seasonal allergic rhinitis    Shingles 09/2015   R hip   Vitamin B12 deficiency 06/19/2012   Vitamin D deficiency 12/16/2015   Vitamin D-OH level 12     MEDICATIONS: Current Outpatient Medications on File Prior to Visit  Medication Sig Dispense Refill   acetaminophen (TYLENOL) 500 MG tablet Take 1,000 mg by mouth every 6 (six) hours as needed for headache (pain). Reported on 12/15/2015     apixaban (ELIQUIS) 5 MG TABS tablet TAKE 1 TABLET(5 MG) BY MOUTH TWICE DAILY 180 tablet 1   baclofen (LIORESAL) 10 MG tablet TAKE 1 TABLET(10 MG) BY MOUTH THREE TIMES DAILY AS NEEDED FOR MUSCLE SPASMS (Patient not taking: Reported on 07/19/2022) 90 tablet 5   buPROPion (WELLBUTRIN) 100 MG tablet Take 1 tablet (100 mg total) by mouth 2 (two) times daily. 60 tablet 5   cetirizine (ZYRTEC) 10 MG tablet Take 10 mg by mouth at bedtime.      CHOLECALCIFEROL PO Take 5,000 Units by mouth 2 (two) times daily.     cyanocobalamin (DODEX) 1000  MCG/ML injection INJECT 1 ML INTO THE SKIN EVERY 14 DAYS 10 mL 4   dalfampridine 10 MG TB12 TAKE 1 TABLET BY MOUTH EVERY 12  HOURS 60 tablet 5   escitalopram (LEXAPRO) 10 MG tablet TAKE 1 TABLET BY MOUTH AT  BEDTIME 90 tablet 1   fenofibrate 160 MG tablet Take 1 tablet (160 mg total) by mouth at bedtime. 90 tablet 0   gabapentin (NEURONTIN) 100 MG capsule TAKE 3 CAPSULES(300 MG) BY MOUTH AT BEDTIME 90 capsule 0   Insulin Pen Needle 33G X 5 MM MISC 1 each by Does not apply route every 14 (fourteen) days. 100 each 2   Insulin Syringe 27G X 1/2" 1 ML MISC 1 each by Does not apply  route every 14 (fourteen) days. For [place] B12 injection 24 each 1   levonorgestrel (MIRENA) 20 MCG/24HR IUD 1 each by Intrauterine route once. Implanted April 2017     NON FORMULARY Take 1 each by mouth at bedtime. CBD/ cannabis     ustekinumab (STELARA) 90 MG/ML SOSY injection Inject 1 mL (90 mg total) into the skin every 8 (eight) weeks. 1 mL 5   No current facility-administered medications on file prior to visit.    ALLERGIES: Allergies  Allergen Reactions   Iron Other (See Comments)    Blood in stool/Rectal Bleeding   Iron Sucrose Other (See Comments)    Fever, infusion reaction  Flu like symptoms   Ibuprofen Other (See Comments)    Avoids due to Crohns disease   Morphine And Related Itching    itchy   Ofatumumab Other (See Comments)    Crohn's flare   Adhesive [Tape] Rash    Rash with electrodes   Prednisone Other (See Comments) and Anxiety    "crazy", hallucinations Tolerates methylprednisolone hallucinations    FAMILY HISTORY: Family History  Problem Relation Age of Onset   Hypertension Mother    Breast cancer Mother 60   Bone cancer Mother        metastatic from breast   Depression Mother    Hypothyroidism Mother    Colon polyps Father    Diabetes Father        borderline--resolved 2020   Hypertension Father        off meds now   Hyperlipidemia Father        off meds now   Fuch's  dystrophy Father    Multiple sclerosis Sister        affecting brain only   Alcohol abuse Sister    Fuch's dystrophy Sister    Hypertension Sister    Heart disease Sister        poss MI in 13's   Stroke Maternal Grandmother    Lung cancer Maternal Grandmother    Hypertension Maternal Grandmother    Heart disease Paternal Grandmother    Hypertension Paternal Grandmother    Colon cancer Paternal Grandfather    Stomach cancer Neg Hx    Esophageal cancer Neg Hx    Pancreatic cancer Neg Hx       Objective:  Blood pressure 114/78, pulse 96, height 5' 4"  (1.626 m), weight 208 lb 9.6 oz (94.6 kg), SpO2 93 %. General: No acute distress.  Patient appears well-groomed.   Head:  Normocephalic/atraumatic Eyes:  Fundi examined but not visualized Neck: supple, no paraspinal tenderness, full range of motion Heart:  Regular rate and rhythm Neurological Exam:  alert and oriented to person, place, and time.  Speech fluent and not dysarthric, language intact.  CN II-XII intact. Increased tone in lower extremities, muscle strength 5-/5 right hip flexion otherwise 5/5 throughout.  Sensation to pinprick reduced in right upper extremity and left lower extremities and reduced vibratory sensation in left lower extremity..  Sensation to light touch intact.  Deep tendon reflexes 2+ throughout, bilateral Babinski  Finger to nose testing intact.  Spastic gait.  Ambulates with cane.  Timed 25 foot walk 10.98 seconds.      Metta Clines, DO  CC: Even Tomi Bamberger, MD

## 2022-08-24 ENCOUNTER — Ambulatory Visit (INDEPENDENT_AMBULATORY_CARE_PROVIDER_SITE_OTHER): Payer: 59 | Admitting: Neurology

## 2022-08-24 ENCOUNTER — Other Ambulatory Visit (INDEPENDENT_AMBULATORY_CARE_PROVIDER_SITE_OTHER): Payer: 59

## 2022-08-24 ENCOUNTER — Encounter: Payer: Self-pay | Admitting: Neurology

## 2022-08-24 VITALS — BP 114/78 | HR 96 | Ht 64.0 in | Wt 208.6 lb

## 2022-08-24 DIAGNOSIS — G35 Multiple sclerosis: Secondary | ICD-10-CM

## 2022-08-24 DIAGNOSIS — Z23 Encounter for immunization: Secondary | ICD-10-CM | POA: Diagnosis not present

## 2022-08-24 DIAGNOSIS — G35D Multiple sclerosis, unspecified: Secondary | ICD-10-CM

## 2022-08-24 DIAGNOSIS — G992 Myelopathy in diseases classified elsewhere: Secondary | ICD-10-CM

## 2022-08-24 DIAGNOSIS — E781 Pure hyperglyceridemia: Secondary | ICD-10-CM

## 2022-08-24 DIAGNOSIS — M4802 Spinal stenosis, cervical region: Secondary | ICD-10-CM

## 2022-08-24 LAB — VITAMIN D 25 HYDROXY (VIT D DEFICIENCY, FRACTURES): VITD: 31.7 ng/mL (ref 30.00–100.00)

## 2022-08-24 NOTE — Patient Instructions (Signed)
Follow up with neurosurgery Check vit D level Follow up 6 months.

## 2022-08-25 ENCOUNTER — Encounter: Payer: Self-pay | Admitting: Family Medicine

## 2022-08-25 LAB — LIPID PANEL
Chol/HDL Ratio: 3.2 ratio (ref 0.0–4.4)
Cholesterol, Total: 166 mg/dL (ref 100–199)
HDL: 52 mg/dL (ref >39)
LDL Chol Calc (NIH): 45 mg/dL (ref 0–99)
Triglycerides: 481 mg/dL — ABNORMAL HIGH (ref 0–149)
VLDL Cholesterol Cal: 69 mg/dL — ABNORMAL HIGH (ref 5–40)

## 2022-08-25 NOTE — Telephone Encounter (Signed)
From her medications I couldn't find anything about any of them causing elevated triglycerides. You may want to consider re-checking her TSH as that hasn't been done in 4 years and hypothyroidism could certainly cause elevated triglycerides. As far as treatment goes, I would consider Vascepa for her. My only hesitation is that it can sometimes cause GI symptoms (but usually constipation more than diarrhea). Do we have samples of it? Maybe we could try it to see how she does with it and that she doesn't expel the capsules. Unfortunately there isn't a lot of reliable data for use with ostomy patients. The other thought I had was to start her on a statin if the Vascepa isn't an option. The statins can reduce triglycerides by 10-40%.

## 2022-08-27 ENCOUNTER — Other Ambulatory Visit: Payer: 59

## 2022-08-30 ENCOUNTER — Encounter: Payer: Self-pay | Admitting: Internal Medicine

## 2022-08-30 ENCOUNTER — Encounter: Payer: Self-pay | Admitting: Family Medicine

## 2022-08-30 ENCOUNTER — Telehealth (INDEPENDENT_AMBULATORY_CARE_PROVIDER_SITE_OTHER): Payer: 59 | Admitting: Family Medicine

## 2022-08-30 VITALS — Ht 64.0 in

## 2022-08-30 DIAGNOSIS — E781 Pure hyperglyceridemia: Secondary | ICD-10-CM

## 2022-08-30 DIAGNOSIS — Z5181 Encounter for therapeutic drug level monitoring: Secondary | ICD-10-CM | POA: Diagnosis not present

## 2022-08-30 DIAGNOSIS — E559 Vitamin D deficiency, unspecified: Secondary | ICD-10-CM | POA: Diagnosis not present

## 2022-08-30 MED ORDER — ICOSAPENT ETHYL 1 G PO CAPS: 2.0000 g | ORAL_CAPSULE | Freq: Every day | ORAL | 1 refills | Status: DC

## 2022-08-30 NOTE — Patient Instructions (Signed)
We sent in prescription for Vascepa.  We will have you start at a low dose (2000 mg daily, rather than 4038m) due to slight increased bleeding risk. You can start on 2000 mg daily of OTC fish oil while waiting to see if insurance covers the VHannah Stop this if you notice any increase in bleeding/bruising. Continue the fenofibrate, and lowfat diet.  Return for fasting lab visit in 6 weeks. We will recheck the lipids (including TG)--recognizing that your generic just recently changed.    Continue to limit sugar, sweets, fried foods, as well as carbs, watching portion sizes and working on weight loss. We briefly discussed potential benefit of increased plant-based proteins, cutting back on meat.

## 2022-08-30 NOTE — Progress Notes (Signed)
Start time: 1:30 End time: 1:59  Virtual Visit via Video Note  I connected with Lindsay Mccarthy on 08/30/22 by a video enabled telemedicine application and verified that I am speaking with the correct person using two identifiers.  Location: Patient: home Provider: office   I discussed the limitations of evaluation and management by telemedicine and the availability of in person appointments. The patient expressed understanding and agreed to proceed.  History of Present Illness:  Chief Complaint  Patient presents with   lab results    VIRTUAL to go over lab results.     Lab Results  Component Value Date   CHOL 166 08/24/2022   HDL 52 08/24/2022   LDLCALC 45 08/24/2022   TRIG 481 (H) 08/24/2022   CHOLHDL 3.2 08/24/2022   Discussed with pharmacist re: her meds, and ostomy. She advised-- "From her medications I couldn't find anything about any of them causing elevated triglycerides. You may want to consider re-checking her TSH as that hasn't been done in 4 years and hypothyroidism could certainly cause elevated triglycerides. As far as treatment goes, I would consider Vascepa for her. My only hesitation is that it can sometimes cause GI symptoms (but usually constipation more than diarrhea). Do we have samples of it? Maybe we could try it to see how she does with it and that she doesn't expel the capsules. Unfortunately there isn't a lot of reliable data for use with ostomy patients. The other thought I had was to start her on a statin if the Vascepa isn't an option. The statins can reduce triglycerides by 10-40%."  In September patient reported that her diet had been pretty good (much improved from the diet she described at her physical), eating fried/sweet foods once or twice a week. She eats fruit or an egg for breakfast and leftovers or salad for lunch.  All beverages are sugar-free (Crystal Light, water, black coffee). Today she reports she has cleaned up diet even more--cut  out the morning egg, having fat free greek yogurt with some splenda. Eating more vegetables, chicken (cooked at home in instapot with seasoning or sugar-free BBQ sauce,  and vegetables for lunch. When she has carbs, limiting to 1 carb per meal. Lowfat or fat-fee cottage cheese, air-popped corn.  205# last month in our office. Feels like she lost some weight--197# on home scale last week.  She previously took omega-3 fish oil, which were stopped when anticoagulants were started.  Reports compliance with fenofibrate. Recent refill looks different--hasn't started this med yet.  Asked pharmacist about increased bleeding risk with fish oils, and Vascepa/Lovaza. Response from pharmacist re: fish oil question: Yes there is a risk with bleeding with all Omega 3s but it is dose dependent. You could always try a lower dose to see if this helps but I don't think there is a difference in [place] vs EPA with bleeding risk. She is definitely younger so at less of a risk of bleeding overall, the only concern I had was with this listed on her problem list "Iron deficiency anemia due to chronic blood loss" - does she have frequent GI bleeding? I found it listed in some notes when looking back but it doesn't seem as though this happens often and may have just been one incident.. ? I usually discontinue it with the older patients because they are at a higher risk of bleeding to begin with but for her (depending on her GI bleed history), the benefit of preventing pancreatitis might be greater  Iron deficiency anemia--she states that when she had anemia, she never noticed bleeding. Levels are monitored by Dr. Irene Limbo, taking liquid iron daily. Last levels were good.  Lab Results  Component Value Date   WBC 8.9 07/13/2022   HGB 14.1 07/13/2022   HCT 41.6 07/13/2022   MCV 89.8 07/13/2022   PLT 411 (H) 07/13/2022   Lab Results  Component Value Date   FERRITIN 56 07/12/2022   Surgery was 12/11/21.  TG results: 08/2022  481 07/2022 378 12/2021 251 Prior to that had been <200  (had very poor diet in 12/2020, due to not feeling well).  Vitamin D--she reports her dose was increased from 10,000 to 15,000 IU daily (by Dr Tomi Likens), wanting higher D level for MS. She is requesting recheck of level with next labs.  PMH, PSH, SH reviewed  Outpatient Encounter Medications as of 08/30/2022  Medication Sig Note   apixaban (ELIQUIS) 5 MG TABS tablet TAKE 1 TABLET(5 MG) BY MOUTH TWICE DAILY    baclofen (LIORESAL) 10 MG tablet TAKE 1 TABLET(10 MG) BY MOUTH THREE TIMES DAILY AS NEEDED FOR MUSCLE SPASMS 08/30/2022: qhs   buPROPion (WELLBUTRIN) 100 MG tablet Take 1 tablet (100 mg total) by mouth 2 (two) times daily.    cetirizine (ZYRTEC) 10 MG tablet Take 10 mg by mouth at bedtime.     CHOLECALCIFEROL PO Take 15,000 Units by mouth daily.    cyanocobalamin (DODEX) 1000 MCG/ML injection INJECT 1 ML INTO THE SKIN EVERY 14 DAYS    dalfampridine 10 MG TB12 TAKE 1 TABLET BY MOUTH EVERY 12  HOURS    escitalopram (LEXAPRO) 10 MG tablet TAKE 1 TABLET BY MOUTH AT  BEDTIME    fenofibrate 160 MG tablet Take 1 tablet (160 mg total) by mouth at bedtime.    FERROUS SULFATE PO Take 1 each by mouth daily. 08/30/2022: Puts in her juice every day-dropper full (liquid)   gabapentin (NEURONTIN) 100 MG capsule TAKE 3 CAPSULES(300 MG) BY MOUTH AT BEDTIME    icosapent Ethyl (VASCEPA) 1 g capsule Take 2 capsules (2 g total) by mouth daily.    Insulin Pen Needle 33G X 5 MM MISC 1 each by Does not apply route every 14 (fourteen) days.    Insulin Syringe 27G X 1/2" 1 ML MISC 1 each by Does not apply route every 14 (fourteen) days. For [place] B12 injection    levonorgestrel (MIRENA) 20 MCG/24HR IUD 1 each by Intrauterine route once. Implanted April 2017    NON FORMULARY Take 1 each by mouth at bedtime. CBD/ cannabis 05/19/2022: prn   ustekinumab (STELARA) 90 MG/ML SOSY injection Inject 1 mL (90 mg total) into the skin every 8 (eight) weeks.    acetaminophen  (TYLENOL) 500 MG tablet Take 1,000 mg by mouth every 6 (six) hours as needed for headache (pain). Reported on 12/15/2015 (Patient not taking: Reported on 08/30/2022) 08/30/2022: prn   No facility-administered encounter medications on file as of 08/30/2022.   NOT taking vascepa prior to today's visit  Allergies  Allergen Reactions   Iron Other (See Comments)    Blood in stool/Rectal Bleeding   Iron Sucrose Other (See Comments)    Fever, infusion reaction  Flu like symptoms   Ibuprofen Other (See Comments)    Avoids due to Crohns disease   Morphine And Related Itching    itchy   Ofatumumab Other (See Comments)    Crohn's flare   Adhesive [Tape] Rash    Rash with electrodes   Prednisone  Other (See Comments) and Anxiety    "crazy", hallucinations Tolerates methylprednisolone hallucinations   ROS:  no fever, chills, URI symptoms.  No n/v/d, no blood in bowels or other known bleeding.  Denies abdominal pain. Denies rashes. Moods are good. See HPI   Observations/Objective:  Ht 5' 4"  (1.626 m)   BMI 35.81 kg/m   Well appearing female, in no distress HEENT: conjunctiva and sclera are clear, EOMI. Crania nerves grossly normal. Exam is limited due to virtual nature of the visit. She has normal mood, affect, grooming.   Assessment and Plan:  Hypertriglyceridemia - worsening control despite improved diet. Reviewed diet; discussed statin vs omega-3 and potential increased bleeding risks. Willing to try Vascepa, low dose. - Plan: icosapent Ethyl (VASCEPA) 1 g capsule, Lipid panel, CBC with Differential/Platelet, TSH  Vitamin D deficiency - Plan: VITAMIN D 25 Hydroxy (Vit-D Deficiency, Fractures)  Medication monitoring encounter - Plan: VITAMIN D 25 Hydroxy (Vit-D Deficiency, Fractures), Lipid panel, CBC with Differential/Platelet, TSH   Risks/SE of Vascepa reviewed in detail (potentially increased bleeding risk); to only take 2000 mg daily. Cont fenofibrate (though aware that  manufacturer will be changing hen she starts new bottle) Discussed potential for statin to help with TG--prefers to try omega-3 instead.  Not wanting any potential for myalgia, or increased risk of myopathy (with the fibrate and statin together), declines trial of statin.  Will check TSH with her f/u labs in 6 weeks (Lipids, CBC, TSH and repeat D level). . If any bleeding or recurrent anemia, will need to stop Vascepa. Will start OTC fish oil (has at home), 2000 mg daily, while waiting to see if prior Josem Kaufmann is needed for Vascepa.  6 wk fasting lab visit.  Follow Up Instructions:    I discussed the assessment and treatment plan with the patient. The patient was provided an opportunity to ask questions and all were answered. The patient agreed with the plan and demonstrated an understanding of the instructions.   The patient was advised to call back or seek an in-person evaluation if the symptoms worsen or if the condition fails to improve as anticipated.  I spent 35 minutes dedicated to the care of this patient, including pre-visit review of records, face to face time, post-visit ordering of testing and documentation.    Vikki Ports, MD

## 2022-09-06 ENCOUNTER — Telehealth: Payer: Self-pay | Admitting: Internal Medicine

## 2022-09-06 NOTE — Telephone Encounter (Signed)
Please see note from Pt:  [facility] [place] contacted and spoke to [place]: Lincoln Brigham stated that Pt Stelara needs a PA. Please assist

## 2022-09-06 NOTE — Telephone Encounter (Signed)
Inbound call from pharmacy stating they have entered on covermy meds. Com and have a key for [id number]. Please advise.

## 2022-09-08 ENCOUNTER — Other Ambulatory Visit (HOSPITAL_COMMUNITY): Payer: Self-pay

## 2022-09-08 ENCOUNTER — Telehealth: Payer: Self-pay

## 2022-09-08 NOTE — Telephone Encounter (Signed)
Patient Advocate Encounter   Received notification from [employer] that prior authorization for Stelara 84m/ml is required.   PA submitted on 09/08/2022 Key [id number] Status is pending       AJoneen Boers CLowry CityPatient Advocate Specialist CRoyal PinesPatient Advocate Team Direct Number: [phone]: [phone]

## 2022-09-09 NOTE — Telephone Encounter (Signed)
Pt made aware that PA was approved:  Pt given number to [place] to call to ensure that everything is good to go and to confirm that they will be sending her medication soon: Pt verbalized understanding with all questions answered.

## 2022-09-09 NOTE — Telephone Encounter (Signed)
Please telephone encounter [phone]. PA has been approved.

## 2022-09-15 ENCOUNTER — Other Ambulatory Visit: Payer: Self-pay

## 2022-09-15 ENCOUNTER — Other Ambulatory Visit: Payer: Self-pay | Admitting: Family Medicine

## 2022-09-15 ENCOUNTER — Other Ambulatory Visit: Payer: Self-pay | Admitting: Neurology

## 2022-09-15 DIAGNOSIS — D509 Iron deficiency anemia, unspecified: Secondary | ICD-10-CM

## 2022-09-15 DIAGNOSIS — F325 Major depressive disorder, single episode, in full remission: Secondary | ICD-10-CM

## 2022-09-15 DIAGNOSIS — E538 Deficiency of other specified B group vitamins: Secondary | ICD-10-CM

## 2022-09-15 MED ORDER — CYANOCOBALAMIN 1000 MCG/ML IJ SOLN: INTRAMUSCULAR | 4 refills | Status: AC

## 2022-09-16 ENCOUNTER — Other Ambulatory Visit: Payer: Self-pay

## 2022-09-16 DIAGNOSIS — M62838 Other muscle spasm: Secondary | ICD-10-CM

## 2022-09-16 DIAGNOSIS — G35 Multiple sclerosis: Secondary | ICD-10-CM

## 2022-09-16 MED ORDER — BACLOFEN 10 MG PO TABS: ORAL_TABLET | ORAL | 5 refills | Status: DC

## 2022-09-16 NOTE — Telephone Encounter (Signed)
Refill request received from [facility] for Baclofen 10 mg.  Refill sent

## 2022-10-04 ENCOUNTER — Other Ambulatory Visit: Payer: 59

## 2022-10-04 DIAGNOSIS — E781 Pure hyperglyceridemia: Secondary | ICD-10-CM

## 2022-10-04 DIAGNOSIS — E559 Vitamin D deficiency, unspecified: Secondary | ICD-10-CM

## 2022-10-04 DIAGNOSIS — Z5181 Encounter for therapeutic drug level monitoring: Secondary | ICD-10-CM

## 2022-10-05 ENCOUNTER — Encounter: Payer: Self-pay | Admitting: Family Medicine

## 2022-10-05 LAB — CBC WITH DIFFERENTIAL/PLATELET
Basophils Absolute: 0 10*3/uL (ref 0.0–0.2)
Basos: 1 %
EOS (ABSOLUTE): 0.2 10*3/uL (ref 0.0–0.4)
Eos: 3 %
Hematocrit: 45.2 % (ref 34.0–46.6)
Hemoglobin: 15.1 g/dL (ref 11.1–15.9)
Immature Grans (Abs): 0 10*3/uL (ref 0.0–0.1)
Immature Granulocytes: 0 %
Lymphocytes Absolute: 1.2 10*3/uL (ref 0.7–3.1)
Lymphs: 15 %
MCH: 30.4 pg (ref 26.6–33.0)
MCHC: 33.4 g/dL (ref 31.5–35.7)
MCV: 91 fL (ref 79–97)
Monocytes Absolute: 0.7 10*3/uL (ref 0.1–0.9)
Monocytes: 8 %
Neutrophils Absolute: 5.7 10*3/uL (ref 1.4–7.0)
Neutrophils: 73 %
Platelets: 343 10*3/uL (ref 150–450)
RBC: 4.96 x10E6/uL (ref 3.77–5.28)
RDW: 12.7 % (ref 11.7–15.4)
WBC: 7.8 10*3/uL (ref 3.4–10.8)

## 2022-10-05 LAB — LIPID PANEL
Chol/HDL Ratio: 4.4 ratio (ref 0.0–4.4)
Cholesterol, Total: 208 mg/dL — ABNORMAL HIGH (ref 100–199)
HDL: 47 mg/dL (ref >39)
Triglycerides: 876 mg/dL (ref 0–149)

## 2022-10-05 LAB — VITAMIN D 25 HYDROXY (VIT D DEFICIENCY, FRACTURES): Vit D, 25-Hydroxy: 28.3 ng/mL — ABNORMAL LOW (ref 30.0–100.0)

## 2022-10-05 LAB — TSH: TSH: 1.1 u[IU]/mL (ref 0.450–4.500)

## 2022-10-07 NOTE — Telephone Encounter (Signed)
I would still probably stick with the original plan for starting Vascepa and then considering a statin as a secondary option depending on how she tolerates the Vascepa or if she sees the capsules in her ostomy bag. The only other labs you could consider rechecking as they were last done in September would be LFTs. Liver problems can also increase triglycerides but hers were normal back in September so I am not convinced that would be the culprit either.

## 2022-10-09 ENCOUNTER — Telehealth: Payer: Self-pay | Admitting: Family Medicine

## 2022-10-09 NOTE — Telephone Encounter (Signed)
P.A. VASCEPA  completed

## 2022-10-09 NOTE — Progress Notes (Signed)
Chief Complaint  Patient presents with   Follow-up    Follow up on lipid panel. Taking 10,000iu of vitamin D BID. No concerns.    Patient presents to follow-up on hypertriglyceridemia.  She is frustrated, as she has been compliant with medications (fenofibrate 160mg ), added 2 fish oil daily (never got Vascepa--we didn't get anything requesting prior auth for this medication), and her diet is even better than at prior checks. She finds that her TG have been higher ever since her surgery (ostomy), not correlating to any other changes in her medications. Only thing she hadn't previously mentioned was that she was started on a liquid ferrous sulfate 220mg /70ml after surgery, as per Dr. Candise Che. She states there is some kind of oil, and 3 g of sugar in the liquid ferrous sulfate she is taking.  She stopped taking this last week, hoping this will help. She reports she made sure that she ate perfectly for 3 days prior to her last labs.  In November she started exercising--15 minutes/d on recumbent bike, and 15 mins on a balance board that shakes (helps with stabilizing muscles).  She is averaging about 4 days/week.  She continues to eat a healthier diet--fat free greek yogurt with some splenda. Eating more vegetables, chicken (cooked at home in instapot with seasoning or sugar-free BBQ sauce),  and vegetables for lunch. When she has carbs, limiting to 1 carb per meal. Lowfat or fat-free cottage cheese, air-popped corn. Since last visit she started having 2 lunches/week of plant-based protein (mainly black beans), instead of chicken. Only carb she eats is potato, popcorn.  Only eating out 1-2x/week.  Only had fast food 2x since last visit (splitting meals, trying to avoid deep fried and french fries). Admits that she "eats too much", but is eating healthier. Still doing Weight Watchers. She also started fasting until 11am (other than coffee with sugar-free creamer), and doesn't eat after 7 or 8pm.  She  doesn't see the fish oil capsule in the ostomy bag, but reports she can smell it within an hour (in the bag).   Lab Results  Component Value Date   CHOL 208 (H) 10/04/2022   HDL 47 10/04/2022   LDLCALC Comment (A) 10/04/2022   TRIG 876 (HH) 10/04/2022   CHOLHDL 4.4 10/04/2022   Vitamin D-OH 28.3  Lab Results  Component Value Date   TSH 1.100 10/04/2022   Lab Results  Component Value Date   WBC 7.8 10/04/2022   HGB 15.1 10/04/2022   HCT 45.2 10/04/2022   MCV 91 10/04/2022   PLT 343 10/04/2022   Lab Results  Component Value Date   FERRITIN 56 07/12/2022    PMH, PSH, SH reviewed  Outpatient Encounter Medications as of 10/11/2022  Medication Sig Note   apixaban (ELIQUIS) 5 MG TABS tablet TAKE 1 TABLET(5 MG) BY MOUTH TWICE DAILY    baclofen (LIORESAL) 10 MG tablet TAKE 1 TABLET(10 MG) BY MOUTH THREE TIMES DAILY AS NEEDED FOR MUSCLE SPASMS 10/11/2022: Takes 20mg  qhs   buPROPion (WELLBUTRIN) 100 MG tablet Take 1 tablet (100 mg total) by mouth 2 (two) times daily.    cetirizine (ZYRTEC) 10 MG tablet Take 10 mg by mouth at bedtime.     cyanocobalamin (DODEX) 1000 MCG/ML injection INJECT 1 ML INTO THE SKIN EVERY 14 DAYS    dalfampridine 10 MG TB12 TAKE 1 TABLET BY MOUTH EVERY 12  HOURS    escitalopram (LEXAPRO) 10 MG tablet TAKE 1 TABLET BY MOUTH AT  BEDTIME  fenofibrate 160 MG tablet Take 1 tablet (160 mg total) by mouth at bedtime.    gabapentin (NEURONTIN) 100 MG capsule TAKE 3 CAPSULES BY MOUTH AT  BEDTIME    Insulin Pen Needle 33G X 5 MM MISC 1 each by Does not apply route every 14 (fourteen) days.    Insulin Syringe 27G X 1/2" 1 ML MISC 1 each by Does not apply route every 14 (fourteen) days. For [place] B12 injection    levonorgestrel (MIRENA) 20 MCG/24HR IUD 1 each by Intrauterine route once. Implanted April 2017    ustekinumab (STELARA) 90 MG/ML SOSY injection Inject 1 mL (90 mg total) into the skin every 8 (eight) weeks.    VITAMIN D PO Take 10,000 Int'l Units by mouth 2  (two) times daily.    acetaminophen (TYLENOL) 500 MG tablet Take 1,000 mg by mouth every 6 (six) hours as needed for headache (pain). Reported on 12/15/2015 (Patient not taking: Reported on 08/30/2022) 08/30/2022: prn   FERROUS SULFATE PO Take 1 each by mouth daily. (Patient not taking: Reported on 10/11/2022) 10/11/2022: 325mg /62ml   icosapent Ethyl (VASCEPA) 1 g capsule Take 2 capsules (2 g total) by mouth daily. (Patient not taking: Reported on 10/11/2022) 10/11/2022: Has not started   NON FORMULARY Take 1 each by mouth at bedtime. CBD/ cannabis (Patient not taking: Reported on 10/11/2022) 05/19/2022: prn   [DISCONTINUED] CHOLECALCIFEROL PO Take 15,000 Units by mouth daily.    No facility-administered encounter medications on file as of 10/11/2022.   Allergies  Allergen Reactions   Iron Other (See Comments)    Blood in stool/Rectal Bleeding   Iron Sucrose Other (See Comments)    Fever, infusion reaction  Flu like symptoms   Ibuprofen Other (See Comments)    Avoids due to Crohns disease   Morphine And Related Itching    itchy   Ofatumumab Other (See Comments)    Crohn's flare   Adhesive [Tape] Rash    Rash with electrodes   Prednisone Other (See Comments) and Anxiety    "crazy", hallucinations Tolerates methylprednisolone hallucinations    ROS:  no fever, chills, URI symptoms.  No n/v/d, no blood in bowels. Denies abdominal pain. Denies rashes. Moods are good. Slightly more bleeding around the stoma when she changes her bag once a week--nothing significant. No blood in stool. See HPI.   PHYSICAL EXAM:  BP 94/60   Pulse 72   Ht 5\' 4"  (1.626 m)   Wt 202 lb 12.8 oz (92 kg)   BMI 34.81 kg/m   Wt Readings from Last 3 Encounters:  10/11/22 202 lb 12.8 oz (92 kg)  08/24/22 208 lb 9.6 oz (94.6 kg)  07/19/22 205 lb 9.6 oz (93.3 kg)  12/2021 (last in-person visit at this office) weight 209#  Well-appearing, pleasant female in no distress HEENT: conjunctiva and sclera are clear,  EOMI Neck: no lymphadenopathy, thyromegaly or mass Heart: regular rate and rhythm Lungs: clear bilaterally Back: no spinal or CVA tenderness Abdomen: soft, nontender, no organomegaly or mass Extremities: no edema Neuro: alert and oriented, cranial nerves grossly intact.  Walks with cane. Psych: normal mood, affect, hygiene and grooming.  Somewhat tearful when discussing frustration with how hard she has been working and TG is even higher.   ASSESSMENT/PLAN:  Hypertriglyceridemia - remains very high, despite fenofibrate and fish oil (OTC). Awaiting PA for Vascepa 2gm daily. Trial off the liquid Fe. If still high, dietician consult - Plan: Lipid panel  Vitamin D deficiency - a little low  despite very high OTC dosing. Checked to r/o toxicity. Cont same dose per neuro  Anticoagulant long-term use - aware of potential bleeding risk with fish oil (and Vascepa), so keeping to lower dose.  So far no significant issues  BMI 34.0-34.9,adult - congratulated on weight loss, more to go. Continue healthy diet, may need to limit portions. Increase exercise to 150 min/wk  Awaiting prior auth for Vascepa (was denied, needed to explain why statin not appropriate--this was discussed in that it could help lower TG. But it could increase myopathy risk on the higher dose fenofibrate, and her LDL is very low. She did not want to risk possible myopathy--pt has MS, preferred trial of Vascepa first).  Unsure if the oil/sugar in the iron could truly cause such an increase in her TG, but since Hgb and ferritin have been fine, it does seem worth trying for 6 weeks.  Will sent copy of note to Dr. Candise Che letting him know of this trial, as she may need further monitoring if she remains off the iron.  Fasting labs 6 weeks.  I spent 45 minutes dedicated to the care of this patient, including pre-visit review of records, face to face time, post-visit ordering of testing and documentation.

## 2022-10-11 ENCOUNTER — Ambulatory Visit (INDEPENDENT_AMBULATORY_CARE_PROVIDER_SITE_OTHER): Payer: 59 | Admitting: Family Medicine

## 2022-10-11 ENCOUNTER — Encounter: Payer: Self-pay | Admitting: Family Medicine

## 2022-10-11 VITALS — BP 94/60 | HR 72 | Ht 64.0 in | Wt 202.8 lb

## 2022-10-11 DIAGNOSIS — E559 Vitamin D deficiency, unspecified: Secondary | ICD-10-CM

## 2022-10-11 DIAGNOSIS — Z7901 Long term (current) use of anticoagulants: Secondary | ICD-10-CM

## 2022-10-11 DIAGNOSIS — E781 Pure hyperglyceridemia: Secondary | ICD-10-CM | POA: Diagnosis not present

## 2022-10-11 DIAGNOSIS — Z6834 Body mass index (BMI) 34.0-34.9, adult: Secondary | ICD-10-CM

## 2022-10-11 NOTE — Patient Instructions (Addendum)
Continue your current dose of Vitamin D--I was worried about toxicity, clearly not an issue, as it is slightly low.  Try and get some dietary forms as well.  Stay off the iron.  I'll let Dr. Irene Limbo know of the plan to try off of it (your hemoglobin and iron stores have been fine). Continue your healthy diet. Try and eat a high fiber diet. We will try and get the Vascepa approved, to replace the fish oil.  Still stick with just 2/day given the potential for slight increased bleeding risk, being on eliquis. Next step, should your triglycerides remain high, is to discuss with a dietician.

## 2022-11-09 ENCOUNTER — Other Ambulatory Visit: Payer: Self-pay | Admitting: Neurology

## 2022-11-20 ENCOUNTER — Telehealth: Payer: Self-pay | Admitting: Family Medicine

## 2022-11-20 ENCOUNTER — Encounter: Payer: Self-pay | Admitting: Family Medicine

## 2022-11-20 NOTE — Telephone Encounter (Signed)
P.A. VASCEPA denied due to no Statin use, appeal letter typed

## 2022-11-22 ENCOUNTER — Other Ambulatory Visit: Payer: 59

## 2022-11-22 DIAGNOSIS — E781 Pure hyperglyceridemia: Secondary | ICD-10-CM

## 2022-11-22 LAB — LIPID PANEL
Chol/HDL Ratio: 2.6 ratio (ref 0.0–4.4)
Cholesterol, Total: 172 mg/dL (ref 100–199)
HDL: 66 mg/dL (ref >39)
LDL Chol Calc (NIH): 50 mg/dL (ref 0–99)
Triglycerides: 376 mg/dL — ABNORMAL HIGH (ref 0–149)
VLDL Cholesterol Cal: 56 mg/dL — ABNORMAL HIGH (ref 5–40)

## 2022-11-23 ENCOUNTER — Other Ambulatory Visit: Payer: Self-pay | Admitting: Family Medicine

## 2022-11-23 ENCOUNTER — Encounter: Payer: Self-pay | Admitting: Family Medicine

## 2022-11-23 DIAGNOSIS — F325 Major depressive disorder, single episode, in full remission: Secondary | ICD-10-CM

## 2022-11-24 ENCOUNTER — Other Ambulatory Visit: Payer: Self-pay | Admitting: Family Medicine

## 2022-11-24 DIAGNOSIS — F325 Major depressive disorder, single episode, in full remission: Secondary | ICD-10-CM

## 2022-11-26 NOTE — Telephone Encounter (Signed)
See 11/20/22 telephone call

## 2022-12-06 ENCOUNTER — Other Ambulatory Visit: Payer: Self-pay | Admitting: Family Medicine

## 2022-12-06 DIAGNOSIS — E781 Pure hyperglyceridemia: Secondary | ICD-10-CM

## 2022-12-06 DIAGNOSIS — Z7901 Long term (current) use of anticoagulants: Secondary | ICD-10-CM

## 2022-12-06 DIAGNOSIS — Z86711 Personal history of pulmonary embolism: Secondary | ICD-10-CM

## 2022-12-22 NOTE — Telephone Encounter (Signed)
Appeal was denied, letter in your folder.

## 2023-01-07 ENCOUNTER — Other Ambulatory Visit: Payer: Self-pay

## 2023-01-07 DIAGNOSIS — E538 Deficiency of other specified B group vitamins: Secondary | ICD-10-CM

## 2023-01-07 DIAGNOSIS — D509 Iron deficiency anemia, unspecified: Secondary | ICD-10-CM

## 2023-01-10 ENCOUNTER — Telehealth: Payer: Self-pay | Admitting: Hematology

## 2023-01-10 ENCOUNTER — Inpatient Hospital Stay: Payer: 59

## 2023-01-10 NOTE — Telephone Encounter (Signed)
Called patient per 3/11 IB message to reschedule 3/11 & 3/12 appointments. Patient rescheduled and notified.

## 2023-01-11 ENCOUNTER — Inpatient Hospital Stay: Payer: 59 | Admitting: Hematology

## 2023-01-19 ENCOUNTER — Other Ambulatory Visit: Payer: Self-pay | Admitting: Neurology

## 2023-01-21 ENCOUNTER — Encounter: Payer: Self-pay | Admitting: Family Medicine

## 2023-01-21 NOTE — Progress Notes (Unsigned)
No chief complaint on file.   Lindsay Mccarthy is a 47 y.o. female who presents for a complete physical.   Hypertriglyceridemia: She is compliant with medications (fenofibrate 160mg ), added 2 fish oil daily (Vascepa was denied because she isn't on a statin; we kept at lower dose due to potential bleeding risks, she is anticoagulated due to h/o PE/DVT).  She doesn't see the fish oil capsule in the ostomy bag, but reports she can smell it within an hour (in the bag). TG levels got much higher ever since her surgery (ostomy), and use of liquid ferrous sulfate 220mg /29ml after surgery, as per Dr. Irene Limbo. She did a trial stopping this prior to her last labs, and while TG were still high, they were improved.  She wanted to continue to work on diet and exercise, and have labs rechecked today.   She previously reported that in November she started exercising--15 minutes/d on recumbent bike, and 15 mins on a balance board that shakes (helps with stabilizing muscles).  She is averaging about 4 days/week.   She continues to eat a healthier diet--fat free greek yogurt with some splenda. Eating more vegetables, chicken (cooked at home in instapot with seasoning or sugar-free BBQ sauce),  and vegetables for lunch. When she has carbs, limiting to 1 carb per meal. Lowfat or fat-free cottage cheese, air-popped corn. She had added 2 lunches/week of plant-based protein (mainly black beans), instead of chicken. Only carbs she eats-- potato, popcorn.  Only eating out 1-2x/week.   She cut back on fast foods (splitting meals, trying to avoid deep fried and french fries). Admits that she "eats too much", but is eating healthier. Still doing Weight Watchers. In December she also reported that she started fasting until 11am (other than coffee with sugar-free creamer), and doesn't eat after 7 or 8pm.   Component Ref Range & Units 2 mo ago (11/22/22) 3 mo ago (10/04/22) 5 mo ago (08/24/22) 6 mo ago (07/15/22) 1 yr  ago (01/06/22) 2 yr ago (12/31/20) 2 yr ago (06/30/20)  Cholesterol, Total 100 - 199 mg/dL 172 208 High  166 171 199 151 150  Triglycerides 0 - 149 mg/dL 376 High  876 High Panic   481 High  378 High  251 High  189 High  174 High   HDL >39 mg/dL 66 47 52 55 82 56 65  VLDL Cholesterol Cal 5 - 40 mg/dL 56 High  Comment Abnormal  CM 69 High  57 High  40 31 29  LDL Chol Calc (NIH) 0 - 99 mg/dL 50 Comment Abnormal  CM 45 59 77 64 56  Chol/HDL Ratio 0.0 - 4.4 ratio 2.6 4.4 CM 3.2 CM 3.1 CM 2.4 CM 2.7 CM 2.3 CM    She has h/o DVT and PE, 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 She denies bleeding or bruising. Denies shortness of breath, chest pain or leg swelling.  Pt is under the care of Dr. Irene Limbo. She has iron deficiency anemia and h/o elevated plt (felt to be reactive).  She is scheduled to see Dr. Irene Limbo next month. (Last visit was in September, when taking liquid iron).  Lab Results  Component Value Date   WBC 7.8 10/04/2022   HGB 15.1 10/04/2022   HCT 45.2 10/04/2022   MCV 91 10/04/2022   PLT 343 10/04/2022   Lab Results  Component Value Date   IRON 67 07/12/2022   TIBC 550 (H) 07/12/2022   FERRITIN 56 07/12/2022   B12 deficiency--on injections every 2  weeks.  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   Pathology was benign. She had ileoscopy in 06/2022, biopsy showed inflammation.  Recheck in 5 years recommended.  She prefers to keep the ostomy rather than take it down. She remains under the care of Dr. Carlean Purl for her Crohn's.  She has been doing well on Stelara.   Depression follow-up: She had noted worsening moods,and seeing Welbutrin SR tablets in her ostomy bag.  We switched her from wellbutrin SR to plain wellbutrin 100mg  BID.  She noted improvement in moods with this change. She continues to take Lexapro 10mg  daily. She  denies side effects.   Vitamin D deficiency--found to be low at 21 in 09/2018.  She is currently taking 5000 IU twice daily, and had level rechecked 06/2021 and was normal at 58.8.  Last level was 28.3 in 10/2022.   ***LEFT OFF HERE   MS--last saw Dr. Tomi Likens in 12/2021. She had been put on Kesimpta (in place of Ocrevus) in 07/2021, getting the 3 starter doses only, which caused flare of Crohn's (for which she had recent surgery).  She thinks she also had some GI side effects from the Advance (in hindsight). He elected to hold of on further disease-modifying treatment for now. She continues to take Ampyra, gabapentin, and baclofen.  He has her on D3 5000 IU BID and monitors levels (which have been normal).  He is planning to repeat MRI of brain, C and T spine in 6 months to establish new baseline.   She also notices the Ampyra passing through (sees it in her bag).  She can tell it isn't working as well, has some recurrent heavy feeling in her legs.  Her bladder has been fine (but hasn't been stressed).  She had been noticing more electric shocks, so increases gabapentin to 200mg  on bad days (instead of just 100mg ).  History of abnormal paps, HPV. Treated with LEEP in 2011. She is under the care of Dr. Quincy Simmonds for her GYN care, last seen 04/2020 for annual exam, and had normal pelvic US in 05/2020. She had normal paps in 12/2017 and 04/2019, with no HPV detected.  She had her Mirena IUD replaced in April 2017. She has never been pregnant, and doesn't want children. Proper Mirena position confirmed on [place] 05/2020. She is waiting for her stomach to be a little less sore before scheduling appointment.  Osteopenia:  DEXA 09/2013 showed T -1.6.  She had repeat study 10/2016, and had improved some.  She has since had Vitamin D deficiency diagnosed/treated. She takes 5000 IU BID.    She is no longer taking Viactiv, but getting calcium from milk, cottage cheese, Boost protein shakes, etc.  She doesn't get any  weight-bearing exercise.    Immunization History  Administered Date(s) Administered   COVID-19, mRNA, vaccine(Comirnaty)12 years and older 08/24/2022   Hepatitis A 06/29/2012, 01/04/2013   Influenza Split 08/09/2012   Influenza,inj,Quad PF,6+ Mos 09/18/2013, 10/07/2014, 12/15/2015, 08/02/2016, 10/05/2017, 07/18/2019, 07/30/2022   PFIZER(Purple Top)SARS-COV-2 Vaccination 05/24/2020, 06/14/2020, 11/14/2020   PPD Test 04/30/2013, 10/07/2014   Pneumococcal Conjugate-13 04/12/2018   Pneumococcal Polysaccharide-23 07/06/2012, 07/30/2022   Tdap 12/15/2015   Last Pap smear: 04/2019, normal with no high risk HPV Last mammogram: 12/2020 Last colonoscopy: 01/2021, along with EGD (Dr. Carlean Purl) Last DEXA: 10/2016 Ophtho: yearly Dentist: twice yearly  Exercise:  None since  recent surgery, really none since October when she started feeling bad. Prior routine-- recumbent bike daily (most of the time), 5 minutes (legs can't tolerate more). She marches in place for 10-15 minutes daily. Only rarely uses hand weights. She sees dermatologist (Dr. Derrel Nip) regularly   [place], [place], [place] and [place] reviewed and updated  Outpatient Encounter Medications as of 01/24/2023  Medication Sig Note   acetaminophen (TYLENOL) 500 MG tablet Take 1,000 mg by mouth every 6 (six) hours as needed for headache (pain). Reported on 12/15/2015 (Patient not taking: Reported on 08/30/2022) 08/30/2022: prn   apixaban (ELIQUIS) 5 MG TABS tablet TAKE 1 TABLET BY MOUTH TWICE  DAILY    baclofen (LIORESAL) 10 MG tablet TAKE 1 TABLET(10 MG) BY MOUTH THREE TIMES DAILY AS NEEDED FOR MUSCLE SPASMS 10/11/2022: Takes 20mg  qhs   buPROPion (WELLBUTRIN) 100 MG tablet TAKE 1 TABLET(100 MG) BY MOUTH TWICE DAILY    cetirizine (ZYRTEC) 10 MG tablet Take 10 mg by mouth at bedtime.     cyanocobalamin (DODEX) 1000 MCG/ML injection INJECT 1 ML INTO THE SKIN EVERY 14 DAYS    dalfampridine 10 MG TB12 TAKE 1 TABLET BY MOUTH EVERY 12  HOURS    escitalopram  (LEXAPRO) 10 MG tablet TAKE 1 TABLET BY MOUTH AT  BEDTIME    fenofibrate 160 MG tablet TAKE 1 TABLET BY MOUTH AT  BEDTIME    FERROUS SULFATE PO Take 1 each by mouth daily. (Patient not taking: Reported on 10/11/2022) 10/11/2022: 325mg /34ml   gabapentin (NEURONTIN) 100 MG capsule TAKE 3 CAPSULES BY MOUTH AT  BEDTIME    icosapent Ethyl (VASCEPA) 1 g capsule Take 2 capsules (2 g total) by mouth daily. (Patient not taking: Reported on 10/11/2022) 10/11/2022: Has not started   Insulin Pen Needle 33G X 5 MM MISC 1 each by Does not apply route every 14 (fourteen) days.    Insulin Syringe 27G X 1/2" 1 ML MISC 1 each by Does not apply route every 14 (fourteen) days. For [place] B12 injection    levonorgestrel (MIRENA) 20 MCG/24HR IUD 1 each by Intrauterine route once. Implanted April 2017    NON FORMULARY Take 1 each by mouth at bedtime. CBD/ cannabis (Patient not taking: Reported on 10/11/2022) 05/19/2022: prn   ustekinumab (STELARA) 90 MG/ML SOSY injection Inject 1 mL (90 mg total) into the skin every 8 (eight) weeks.    VITAMIN D PO Take 10,000 Int'l Units by mouth 2 (two) times daily.    No facility-administered encounter medications on file as of 01/24/2023.   Allergies  Allergen Reactions   Iron Other (See Comments)    Blood in stool/Rectal Bleeding   Iron Sucrose Other (See Comments)    Fever, infusion reaction  Flu like symptoms   Ibuprofen Other (See Comments)    Avoids due to Crohns disease   Morphine And Related Itching    itchy   Ofatumumab Other (See Comments)    Crohn's flare   Adhesive [Tape] Rash    Rash with electrodes   Prednisone Other (See Comments) and Anxiety    "crazy", hallucinations Tolerates methylprednisolone hallucinations    ROS:  The patient denies anorexia, fever, headaches,  vision changes, decreased hearing, ear pain, sore throat, breast concerns, chest pain, palpitations, dizziness, syncope, cough, nausea, vomiting, indigestion/heartburn, hematuria, dysuria,  vaginal bleeding, discharge, odor or itch, genital lesions, tremor, suspicious skin lesions, abnormal or enlarged lymph nodes. Has Mirena, rarely sees any bleeding/spotting. Chronic allergies, controlled by OTC meds.  CBD and marijuana helps  with her leg spasms at night, alternates at night. Gait instability and incontinence per HPI, improved on current treatments. No longer having back pain or joint pains (since stopping ocrevus--just slight knee or ankle pain when it is cold/rainy) Depression is controlled +bruising, denies bleeding. Rare tingling in fingers, usually when also feeling electric shocks in legs, better since restarting gabapentin.  Has had some since her surgery. Higher dose of gabapentin prn helps. Abdominal pain currently is just post-op, no further cramping.  +loose output in the ostomy, not bloody, no mucus. Some DOE since surgery, improving gradually.    PHYSICAL EXAM:  There were no vitals taken for this visit.  Wt Readings from Last 3 Encounters:  10/11/22 202 lb 12.8 oz (92 kg)  08/24/22 208 lb 9.6 oz (94.6 kg)  07/19/22 205 lb 9.6 oz (93.3 kg)  12/2020 weight was 224# General Appearance:     Alert, cooperative, no distress, appears stated age   Head:     Normocephalic, without obvious abnormality, atraumatic   Eyes:     PERRL, conjunctiva/corneas clear, EOM's intact, fundi benign   Ears:     Normal TM's and external ear canals   Nose:    Not examined, wearing mask due to COVID-19 pandemic   Throat:    Not examined, wearing mask due to COVID-19 pandemic   Neck:    Supple, no lymphadenopathy;  thyroid:  no enlargement/tenderness/ nodules; no carotid bruit or JVD   Back:     Spine nontender, no curvature, ROM normal, no CVA  tenderness.    Lungs:      Clear to auscultation bilaterally without wheezes, rales or ronchi; respirations unlabored   Chest Wall:     No tenderness or deformity    Heart:     Regular rate and rhythm, S1 and S2 normal, no murmur, rub or  gallop   Breast Exam:     Deferred to GYN  Abdomen:      Soft, nondistended, normoactive bowel sounds, no masses, no hepatosplenomegaly. Ostomy is present.  Healthy tissue at ostomy site.  Mildly tender along the R side of abdomen and near ostomy. Incisions are healing nicely, without evidence of infection.  Genitalia:    deferred to GYN   Rectal:     deferred   Extremities:    No clubbing, cyanosis or edema.   Pulses:    2+ and symmetric all extremities   Skin:    Skin color, texture, turgor normal, no rashes or lesions. Exam is limited, not changed into gown for visit today.  Lymph nodes:    Cervical, supraclavicular nodes normal   Neurologic:    Normal strength, sensation; reflexes 2+                       Psych:   Normal mood, affect, hygiene and grooming       07/19/2022    3:37 PM 05/19/2022   12:05 PM 01/14/2022    8:45 AM 04/22/2021   11:25 AM 12/31/2020    8:49 AM  Depression screen PHQ 2/9  Decreased Interest 0 0 0 0 0  Down, Depressed, Hopeless 0 2 0 0 0  PHQ - 2 Score 0 2 0 0 0  Altered sleeping 0 0 0 0 0  Tired, decreased energy 0 1 0 0 1  Change in appetite 0 0 0 0 2  Feeling bad or failure about yourself  0 0 0 0 0  Trouble concentrating 0  1 0 0 1  Moving slowly or fidgety/restless 0 0 0 0 0  Suicidal thoughts 0 0 0 0 0  PHQ-9 Score 0 4 0 0 4  Difficult doing work/chores Not difficult at all Somewhat difficult Not difficult at all Not difficult at all Not difficult at all     ASSESSMENT/PLAN:   PHQ-9  Discussed monthly self breast exams and yearly mammograms (due now, reminded to schedule); at least 30 minutes of aerobic activity at least 5 days/week including weight-bearing exercise at least twice per week; proper sunscreen use reviewed; healthy diet, including goals of calcium and vitamin D intake and alcohol recommendations (less than or equal to 1 drink/day) reviewed; regular seatbelt use; changing batteries in smoke detectors.  Immunization recommendations  discussed--continue yearly flu shots. Recommended pneumovax booster and bivalent COVID booster.  She declined these today.  Discussed in detail, and encouraged her to get soon.  Colonoscopy recommendations reviewed--UTD. F/u 1 year, sooner prn. Pt to schedule her GYN exam.

## 2023-01-22 ENCOUNTER — Other Ambulatory Visit: Payer: Self-pay | Admitting: Family Medicine

## 2023-01-22 DIAGNOSIS — F325 Major depressive disorder, single episode, in full remission: Secondary | ICD-10-CM

## 2023-01-22 NOTE — Patient Instructions (Incomplete)
  HEALTH MAINTENANCE RECOMMENDATIONS:  It is recommended that you get at least 30 minutes of aerobic exercise at least 5 days/week (for weight loss, you may need as much as 60-90 minutes). This can be any activity that gets your heart rate up. This can be divided in 10-15 minute intervals if needed, but try and build up your endurance at least once a week.  Weight bearing exercise is also recommended twice weekly.  Eat a healthy diet with lots of vegetables, fruits and fiber.  "Colorful" foods have a lot of vitamins (ie green vegetables, tomatoes, red peppers, etc).  Limit sweet tea, regular sodas and alcoholic beverages, all of which has a lot of calories and sugar.  Up to 1 alcoholic drink daily may be beneficial for women (unless trying to lose weight, watch sugars).  Drink a lot of water.  Calcium recommendations are 1200-1500 mg daily (1500 mg for postmenopausal women or women without ovaries), and vitamin D 1000 IU daily.  This should be obtained from diet and/or supplements (vitamins), and calcium should not be taken all at once, but in divided doses.  Monthly self breast exams and yearly mammograms for women over the age of 17 is recommended.  Sunscreen of at least SPF 30 should be used on all sun-exposed parts of the skin when outside between the hours of 10 am and 4 pm (not just when at beach or pool, but even with exercise, golf, tennis, and yard work!)  Use a sunscreen that says "broad spectrum" so it covers both UVA and UVB rays, and make sure to reapply every 1-2 hours.  Remember to change the batteries in your smoke detectors when changing your clock times in the spring and fall. Carbon monoxide detectors are recommended for your home.  Use your seat belt every time you are in a car, and please drive safely and not be distracted with cell phones and texting while driving.  Please schedule your routine mammogram. We discussed potentially repeating your bone density test (is over 5  years from the last, to look and see if there has been any further decline). Be sure to continue your calcium, D, and weight-bearing exercise. You preferred to think about and discuss with Dr. Quincy Simmonds.  Check with Dr. Carlean Purl to see when you are due to see him next. You can also let him know about your recent labs, which show higher liver tests (similar to what they were in June, but had gone back down to normal in September, the last check).   Start Crestor (rosuvastatin) 5mg  once daily. Continue the 2 fish oil daily and fenofibrate, and lowfat diet. If you develop any muscle pains, you can also add in coenzyme Q10 If that doesn't help, contact us and we can adjust the way you take the statin. Recheck your triglycerides in 6 weeks. We will also recheck the liver tests at that point.

## 2023-01-24 ENCOUNTER — Ambulatory Visit (INDEPENDENT_AMBULATORY_CARE_PROVIDER_SITE_OTHER): Payer: 59 | Admitting: Family Medicine

## 2023-01-24 ENCOUNTER — Encounter: Payer: Self-pay | Admitting: Family Medicine

## 2023-01-24 ENCOUNTER — Other Ambulatory Visit: Payer: Self-pay | Admitting: Family Medicine

## 2023-01-24 VITALS — BP 110/80 | HR 79 | Ht 64.5 in | Wt 204.0 lb

## 2023-01-24 DIAGNOSIS — E781 Pure hyperglyceridemia: Secondary | ICD-10-CM

## 2023-01-24 DIAGNOSIS — K50019 Crohn's disease of small intestine with unspecified complications: Secondary | ICD-10-CM

## 2023-01-24 DIAGNOSIS — E538 Deficiency of other specified B group vitamins: Secondary | ICD-10-CM

## 2023-01-24 DIAGNOSIS — R7989 Other specified abnormal findings of blood chemistry: Secondary | ICD-10-CM

## 2023-01-24 DIAGNOSIS — Z7901 Long term (current) use of anticoagulants: Secondary | ICD-10-CM

## 2023-01-24 DIAGNOSIS — Z86711 Personal history of pulmonary embolism: Secondary | ICD-10-CM

## 2023-01-24 DIAGNOSIS — G35 Multiple sclerosis: Secondary | ICD-10-CM

## 2023-01-24 DIAGNOSIS — Z0000 Encounter for general adult medical examination without abnormal findings: Secondary | ICD-10-CM | POA: Diagnosis not present

## 2023-01-24 DIAGNOSIS — E559 Vitamin D deficiency, unspecified: Secondary | ICD-10-CM | POA: Diagnosis not present

## 2023-01-24 DIAGNOSIS — F325 Major depressive disorder, single episode, in full remission: Secondary | ICD-10-CM

## 2023-01-24 DIAGNOSIS — Z932 Ileostomy status: Secondary | ICD-10-CM

## 2023-01-24 LAB — POCT URINALYSIS DIP (PROADVANTAGE DEVICE)
Bilirubin, UA: NEGATIVE
Blood, UA: NEGATIVE
Glucose, UA: NEGATIVE mg/dL
Ketones, POC UA: NEGATIVE mg/dL
Leukocytes, UA: NEGATIVE
Nitrite, UA: NEGATIVE
Protein Ur, POC: NEGATIVE mg/dL
Specific Gravity, Urine: 1.03
Urobilinogen, Ur: NEGATIVE
pH, UA: 6 (ref 5.0–8.0)

## 2023-01-24 MED ORDER — ROSUVASTATIN CALCIUM 5 MG PO TABS: 5.0000 mg | ORAL_TABLET | Freq: Every day | ORAL | 1 refills | Status: DC

## 2023-01-31 ENCOUNTER — Other Ambulatory Visit: Payer: Self-pay | Admitting: Family Medicine

## 2023-01-31 DIAGNOSIS — F325 Major depressive disorder, single episode, in full remission: Secondary | ICD-10-CM

## 2023-01-31 DIAGNOSIS — E781 Pure hyperglyceridemia: Secondary | ICD-10-CM

## 2023-02-20 ENCOUNTER — Other Ambulatory Visit: Payer: Self-pay | Admitting: Family Medicine

## 2023-02-20 DIAGNOSIS — Z7901 Long term (current) use of anticoagulants: Secondary | ICD-10-CM

## 2023-02-20 DIAGNOSIS — Z86711 Personal history of pulmonary embolism: Secondary | ICD-10-CM

## 2023-02-21 ENCOUNTER — Telehealth: Payer: Self-pay | Admitting: Hematology

## 2023-02-21 NOTE — Telephone Encounter (Signed)
Contacted patient to scheduled appointments. Patient is aware of appointments that are scheduled.   

## 2023-02-21 NOTE — Telephone Encounter (Signed)
Do you want to send this until CPE next year?

## 2023-02-21 NOTE — Progress Notes (Unsigned)
NEUROLOGY FOLLOW UP OFFICE NOTE  [name] [id number]  Assessment/Plan:   Multiple sclerosis - due to recent surgery and changes in medication to treat her Crohn's, will defer starting a new DMT for now.  She will be on immunosuppressant therapy for Crohn's anyway.  Compressive cervical myelopathy Bilateral arm/hand numbness - may be radiculopathy vs carpal tunnel syndrome   DMT: Defer Follow up with neurosurgery (scheduled for Nov 2) D3 5000 IU BID, gabapentin 100-200mg  QHS, Ampyra, baclofen  TID PRN Check vit D level She will try wearing wrist splints at night Follow up 6 months.   Subjective:  Lindsay Mccarthy is a 47 year old right-handed woman with Crohn's disease, depression, and anxiety who follows up for multiple sclerosis.    UPDATE: Current disease modifying therapy: None Other medication:  D3 5000 IU BID, B12 injections, Ampyra, gabapentin 100 to  at bedtime, baclofen at bedtime, Wellbutrin, Lexapro , ,Colestid, Bentayl, Eliquis, Mirena  In November, she saw neurosurgery at [facility] regarding cervical spinal stenosis causing compressive myelopathy.  As it is uncertain how much symptoms are related to MS or the C4-5 stenosis, surgery was deferred.  She underwent epidural injections ***  Vision:  No issues Motor:  No new issues Sensory:  numbness and tingling in legs.  Notes new numbness of the upper extremities from hands and entire arms when she wakes up in morning. Pain:  Spasms in legs as well as abdomen. Gait:  Gait improved since restarting Ampyra.  Now usually ambulates without cane unless outside. Bowel/Bladder:  Occasional urinary incontinence when stressed.  Crohn's disease.  Has ostomy Fatigue:  Some fatigue. Cognition:  Trouble multi-tasking.  She underwent neuropsychological testing on 09/04/2019.  Performance fell largely within normal limits.  She did exhibit a weakness across aspects of attention/concentration and some  performance variability across processing speed.  Performance involving complex attention, executive functioning, receptive and expressive language, visual-spatial abilities and verbal and visual learning and memory were within normal range. Mood:  Stable   HISTORY: She reports history of various neurologic symptoms since her late 20s: -  Urinary incontinence:  It started about a year ago and has gotten worse.  It occurs suddenly, once or twice a month, usually at home and while standing, such as while washing dishes with the water running.  It occurs during fatigued or emotional stress as well.  She denies increased urinary frequency or dysuria.  No perineal numbness.  No low back pain.   - She reports poor night vision with difficulty seeing while driving at night. She has trouble focusing.  Recent eye exam showed poor "refocusing", which would normally be seen in an older adult.  Her sister had Fuchs dystrophy but she doesn't have it. -  She reports numbness and tingling in her hands when she wakes up in the morning or in the evening after work. -  She reports abnormal gait.  It feels like her feet are stuck in cement.  She has trouble lifting her feet.  It causes hip pain.  She trips over her feet.  It is worse when there is an extreme change in temperature.  She started having trouble in her mid-30s.  Worse over the past 2 years.  She reports a stabbing pain in her hips or knees.  She reports previously being diagnosed with a pinched nerve in the back that caused a left foot drop in the 20s. She had PT and injections which was ineffective.  No numbness in the legs.   -  When she throws a ball, her hand doesn't release immediately.  There is a delay until she is able to release the ball.  She has similar problems with her left hand.  No neck pain. -  She reports fatigued.  She needs to take a nap during the day.  Prior sleep study was normal.   One of her two sisters has multiple sclerosis.   Other  history:  She has Crohn's disease.  Earlier this year, she developed a DVT in her right leg.  She also was found to have B12 deficiency and is being treated with injections.   01/09/18 LABS:  B12 143, Sed rate 18.  Hypercoagulable panel unremarkable, including lupus anticoagulant panel, dRVVT mix, beta-2-glycoprotein, cardiolipin antibodies, prothrombin gene mutation, factor V Leiden.  NMO/AQP4+ antibody was negative.  Her insurance would not cover anti-MOG antibody testing.   09/2018 LABS:  JC Virus antibody was positive with index of 3.26.  Quantiferon-TB Gold Plus negative.  She tested negative for Hep B.      Imaging: 07/22/2018:  MRI C & T-SPINE W WO:  Multiple T2/STIR hyperintense signal within the spinal cord at C2, C3-C4, C6-C7, T1, T3-T4, T6-T7, and T12 levels, all non-enhancing.  She also demonstrates degenerative spinal stenosis at C4-C5 and C5-C6 with mild spinal cord mass effect. 07/24/2018:  MRI BRAIN W WO:  Mild hyperintense in the periventricular and juxtacortical white matter with signal running perpendicular from the lateral ventricle and involving the corpus callosum, non-enhancing. 04/05/2020 MRI C & T-SPINE W WO:  Chronic patchy multifocal cord signal abnormality throughout cervical and thoracic cord, overall stable compared to 2019.  No active lesions.  Degenerative cervical spondylosis at C4-5 and C5-6 with mild to moderate spinal stenosis and moderate left C5 foraminal narrowing. 05/02/2020 MRI BRAIN W WO:  Unchanged mild subcortical/juxtacortical and deep periventricular white matter foci of signal abnormality consistent with the provided history of multiple sclerosis. No new or enhancing lesion is identified. 07/07/2021 MRI BRAIN W WO:  Similar mild T2 white matter hyperintensities, compatible with the reported history of multiple sclerosis. No convincing new or enhancing lesions identified. 07/19/2021 MRI C-SPINE W WO:  1. Multiple T2 hyperintense cord lesions consistent with  the clinical diagnosis of multiple sclerosis. No new or enhancing lesion identified.  2. Degenerative changes at C4-5 with mild progression of spinal canal stenosis which is moderate, with mass effect on the cord.  There is also mild-to-moderate right and moderate left neural foraminal narrowing at this level. 07/24/2022 MRI BRAIN W WO:  1. Unchanged mild T2 hyperintense foci. No new or enhancing lesions to suggest progressive or active demyelination.  2. No acute intracranial process. 07/17/2022 MRI C & T-SPINE W WO:  1. Redemonstrated scattered T2 hyperintense lesions within the cervical and thoracic spinal cord, not significantly changed from prior exam, and compatible with demyelinating disease. No evidence of contrast enhancement to suggest active demyelination.  2. Compared to prior exam there is new T2 hyperintense signal within the central spinal cord a the C4-C5 and T6-T7 levels where there is moderate to severe spinal canal stenosis. This is favored to represent changes related to chronic compressive myelopathy.  Past DMT:  Ocrevus (effective but would develop recurrence of chronic symptoms prior to] next infusion), Kesimpta (possibly caused Crohn's flare)  Past medications:  gabapentin   PAST MEDICAL HISTORY: Past Medical History:  Diagnosis Date   Anemia    related to Crohns flares   Arm DVT (deep venous thromboembolism), acute, right (HCC) 11/29/2017  Arthritis    knees, feet, hands, wrists, related to Crohns flare   Asthma    Childhood   B12 deficiency    monitored/treated by Dr. Leone Payor   Cervical dysplasia    Clotting disorder (HCC)    on eliquis hx dvt/ pulmonary embolism   COVID 04/13/2021   Crohn disease (HCC)    Crohn's disease of small intestine (HCC) 06/24/2012   Diagnosed 1999, in [place]. Ileitis only then. Treated with Imuran Remicade and prednisone.  Noncompliant with therapy. 2004 return to care and was treated with Remicade prednisone Pentasa Cipro and  Flagyl. 2006 status post right hemicolectomy. Subsequently treated with azathioprine and Cimzia. 200 mg every other week.  azathioprine was added in 2010. Prometheus TP MT enzyme was negative.   Depression    DVT of upper extremity (deep vein thrombosis) (HCC) 11/28/2017   Eczema    arms and behind knees, worse in winter   Generalized anxiety disorder    History of recurrent UTI (urinary tract infection)    HPV (human papilloma virus) infection    Hyperlipemia    Hypertriglyceridemia 07/03/2012   10/2010 labs showed level of 753 with total cholesterol 180 and HDL 41' Labs 05/2013--TG 180    Major depressive disorder in remission (HCC) 10/03/2013   MS (multiple sclerosis) (HCC)    Multiple sclerosis (HCC) 2019   Officially diagnosed in the Fall   Neuromuscular disorder (HCC)    Osteopenia 09/29/2011   T-1.6   Papanicolaou smear 10/2011   last abnormal 2011   Pulmonary embolism (HCC) 11/30/2017   Scaphoid fracture of wrist 2013   left   Seasonal allergic rhinitis    Shingles 09/2015   R hip   Vitamin B12 deficiency 06/19/2012   Vitamin D deficiency 12/16/2015   Vitamin D-OH level 12     MEDICATIONS: Current Outpatient Medications on File Prior to Visit  Medication Sig Dispense Refill   acetaminophen (TYLENOL) 500 MG tablet Take 1,000 mg by mouth every 6 (six) hours as needed for headache (pain). Reported on 12/15/2015     apixaban (ELIQUIS) 5 MG TABS tablet TAKE 1 TABLET(5 MG) BY MOUTH TWICE DAILY 180 tablet 1   baclofen (LIORESAL) 10 MG tablet TAKE 1 TABLET(10 MG) BY MOUTH THREE TIMES DAILY AS NEEDED FOR MUSCLE SPASMS (Patient not taking: Reported on 07/19/2022) 90 tablet 5   buPROPion (WELLBUTRIN) 100 MG tablet Take 1 tablet (100 mg total) by mouth 2 (two) times daily. 60 tablet 5   cetirizine (ZYRTEC) 10 MG tablet Take 10 mg by mouth at bedtime.      CHOLECALCIFEROL PO Take 5,000 Units by mouth 2 (two) times daily.     cyanocobalamin (DODEX) 1000 MCG/ML injection INJECT 1 ML  INTO THE SKIN EVERY 14 DAYS 10 mL 4   dalfampridine 10 MG TB12 TAKE 1 TABLET BY MOUTH EVERY 12  HOURS 60 tablet 5   escitalopram (LEXAPRO) 10 MG tablet TAKE 1 TABLET BY MOUTH AT  BEDTIME 90 tablet 1   fenofibrate 160 MG tablet Take 1 tablet (160 mg total) by mouth at bedtime. 90 tablet 0   gabapentin (NEURONTIN) 100 MG capsule TAKE 3 CAPSULES(300 MG) BY MOUTH AT BEDTIME 90 capsule 0   Insulin Pen Needle 33G X 5 MM MISC 1 each by Does not apply route every 14 (fourteen) days. 100 each 2   Insulin Syringe 27G X 1/2" 1 ML MISC 1 each by Does not apply route every 14 (fourteen) days. For [place] B12 injection 24 each  1   levonorgestrel (MIRENA) 20 MCG/24HR IUD 1 each by Intrauterine route once. Implanted April 2017     NON FORMULARY Take 1 each by mouth at bedtime. CBD/ cannabis     ustekinumab (STELARA) 90 MG/ML SOSY injection Inject 1 mL (90 mg total) into the skin every 8 (eight) weeks. 1 mL 5   No current facility-administered medications on file prior to visit.    ALLERGIES: Allergies  Allergen Reactions   Iron Other (See Comments)    Blood in stool/Rectal Bleeding   Iron Sucrose Other (See Comments)    Fever, infusion reaction  Flu like symptoms   Ibuprofen Other (See Comments)    Avoids due to Crohns disease   Morphine And Related Itching    itchy   Ofatumumab Other (See Comments)    Crohn's flare   Adhesive [Tape] Rash    Rash with electrodes   Prednisone Other (See Comments) and Anxiety    "crazy", hallucinations Tolerates methylprednisolone hallucinations    FAMILY HISTORY: Family History  Problem Relation Age of Onset   Hypertension Mother    Breast cancer Mother 20   Bone cancer Mother        metastatic from breast   Depression Mother    Hypothyroidism Mother    Colon polyps Father    Diabetes Father        borderline--resolved 2020   Hypertension Father        off meds now   Hyperlipidemia Father        off meds now   Fuch's dystrophy Father    Multiple  sclerosis Sister        affecting brain only   Alcohol abuse Sister    Fuch's dystrophy Sister    Hypertension Sister    Heart disease Sister        poss MI in 78's   Stroke Maternal Grandmother    Lung cancer Maternal Grandmother    Hypertension Maternal Grandmother    Heart disease Paternal Grandmother    Hypertension Paternal Grandmother    Colon cancer Paternal Grandfather    Stomach cancer Neg Hx    Esophageal cancer Neg Hx    Pancreatic cancer Neg Hx       Objective:  *** General: No acute distress.  Patient appears well-groomed.   Head:  Normocephalic/atraumatic Eyes:  Fundi examined but not visualized Neck: supple, no paraspinal tenderness, full range of motion Heart:  Regular rate and rhythm Neurological Exam:  alert and oriented.  Speech fluent and not dysarthric.  Language intact.  CN II-XII intact.  Increased tone in lower extremities.  Muscle strength 5-/5 right hip flexion, otherwise 5/5 throughout.  Sensation to ***.  Deep tendon reflexes 2+ throughout, bilateral Babinski.  Finger to nose intact.  Spastic gait.  Ambulates with cane.  ***    Shon Millet, DO  CC: Even Lynelle Doctor, MD

## 2023-02-22 ENCOUNTER — Inpatient Hospital Stay: Payer: 59 | Attending: Hematology

## 2023-02-22 DIAGNOSIS — Z86718 Personal history of other venous thrombosis and embolism: Secondary | ICD-10-CM | POA: Insufficient documentation

## 2023-02-22 DIAGNOSIS — D509 Iron deficiency anemia, unspecified: Secondary | ICD-10-CM | POA: Insufficient documentation

## 2023-02-22 DIAGNOSIS — D75839 Thrombocytosis, unspecified: Secondary | ICD-10-CM | POA: Diagnosis not present

## 2023-02-22 DIAGNOSIS — Z86711 Personal history of pulmonary embolism: Secondary | ICD-10-CM | POA: Insufficient documentation

## 2023-02-22 DIAGNOSIS — E538 Deficiency of other specified B group vitamins: Secondary | ICD-10-CM | POA: Diagnosis not present

## 2023-02-22 DIAGNOSIS — K5090 Crohn's disease, unspecified, without complications: Secondary | ICD-10-CM | POA: Insufficient documentation

## 2023-02-22 LAB — CBC WITH DIFFERENTIAL (CANCER CENTER ONLY)
Abs Immature Granulocytes: 0.02 10*3/uL (ref 0.00–0.07)
Basophils Absolute: 0 10*3/uL (ref 0.0–0.1)
Basophils Relative: 0 %
Eosinophils Absolute: 0.2 10*3/uL (ref 0.0–0.5)
Eosinophils Relative: 2 %
HCT: 42.8 % (ref 36.0–46.0)
Hemoglobin: 14.1 g/dL (ref 12.0–15.0)
Immature Granulocytes: 0 %
Lymphocytes Relative: 16 %
Lymphs Abs: 1.3 10*3/uL (ref 0.7–4.0)
MCH: 30.5 pg (ref 26.0–34.0)
MCHC: 32.9 g/dL (ref 30.0–36.0)
MCV: 92.4 fL (ref 80.0–100.0)
Monocytes Absolute: 0.8 10*3/uL (ref 0.1–1.0)
Monocytes Relative: 9 %
Neutro Abs: 6.2 10*3/uL (ref 1.7–7.7)
Neutrophils Relative %: 73 %
Platelet Count: 373 10*3/uL (ref 150–400)
RBC: 4.63 MIL/uL (ref 3.87–5.11)
RDW: 13 % (ref 11.5–15.5)
WBC Count: 8.5 10*3/uL (ref 4.0–10.5)
nRBC: 0 % (ref 0.0–0.2)

## 2023-02-22 LAB — CMP (CANCER CENTER ONLY)
ALT: 13 U/L (ref 0–44)
AST: 17 U/L (ref 15–41)
Albumin: 4.1 g/dL (ref 3.5–5.0)
Alkaline Phosphatase: 51 U/L (ref 38–126)
Anion gap: 5 (ref 5–15)
BUN: 15 mg/dL (ref 6–20)
CO2: 29 mmol/L (ref 22–32)
Calcium: 9.5 mg/dL (ref 8.9–10.3)
Chloride: 105 mmol/L (ref 98–111)
Creatinine: 1.02 mg/dL — ABNORMAL HIGH (ref 0.44–1.00)
GFR, Estimated: >60 mL/min (ref >60)
Glucose, Bld: 105 mg/dL — ABNORMAL HIGH (ref 70–99)
Potassium: 4.4 mmol/L (ref 3.5–5.1)
Sodium: 139 mmol/L (ref 135–145)
Total Bilirubin: 0.3 mg/dL (ref 0.3–1.2)
Total Protein: 7.3 g/dL (ref 6.5–8.1)

## 2023-02-22 LAB — IRON AND IRON BINDING CAPACITY (CC-WL,HP ONLY)
Iron: 96 ug/dL (ref 28–170)
Saturation Ratios: 15 % (ref 10.4–31.8)
TIBC: 661 ug/dL — ABNORMAL HIGH (ref 250–450)
UIBC: 565 ug/dL — ABNORMAL HIGH (ref 148–442)

## 2023-02-22 LAB — VITAMIN B12: Vitamin B-12: 178 pg/mL — ABNORMAL LOW (ref 180–914)

## 2023-02-23 ENCOUNTER — Ambulatory Visit (INDEPENDENT_AMBULATORY_CARE_PROVIDER_SITE_OTHER): Payer: 59 | Admitting: Neurology

## 2023-02-23 ENCOUNTER — Encounter: Payer: Self-pay | Admitting: Neurology

## 2023-02-23 ENCOUNTER — Inpatient Hospital Stay (HOSPITAL_BASED_OUTPATIENT_CLINIC_OR_DEPARTMENT_OTHER): Payer: 59 | Admitting: Hematology

## 2023-02-23 VITALS — BP 119/64 | HR 75 | Ht 64.0 in | Wt 210.0 lb

## 2023-02-23 DIAGNOSIS — D509 Iron deficiency anemia, unspecified: Secondary | ICD-10-CM

## 2023-02-23 DIAGNOSIS — G992 Myelopathy in diseases classified elsewhere: Secondary | ICD-10-CM

## 2023-02-23 DIAGNOSIS — M4802 Spinal stenosis, cervical region: Secondary | ICD-10-CM

## 2023-02-23 DIAGNOSIS — G35 Multiple sclerosis: Secondary | ICD-10-CM

## 2023-02-23 LAB — FERRITIN: Ferritin: 14 ng/mL (ref 11–307)

## 2023-02-23 NOTE — Patient Instructions (Addendum)
D3 10,000 IU daily Gabapentin  Ampyra Baclofen MRI of brain, cervical and thoracic spine with and without contrast and vitamin D level in 6 months Follow up in 6 months after repeat testing.

## 2023-02-23 NOTE — Progress Notes (Signed)
HEMATOLOGY/ONCOLOGY PHONE VISIT NOTE  Date of Service: 02/23/2023   Patient Care Team: Joselyn Arrow, MD as PCP - General (Family Medicine) Drema Dallas, DO as Consulting Physician (Neurology) Patton Salles, MD as Consulting Physician (Obstetrics and Gynecology) Johney Maine, MD as Consulting Physician (Oncology) Iva Boop, MD as Consulting Physician (Gastroenterology)  CHIEF COMPLAINTS/PURPOSE OF CONSULTATION:  Follow-up for management of iron deficiency  HISTORY OF PRESENTING ILLNESS:   Please see previous note for details on initial presentation  INTERVAL HISTORY   Lindsay Mccarthy is a 47 y.o. female who was contacted via phone for follow-up for management of iron deficiency anemia. I last connected with patient on 07/13/2022 [facility] and she was doing well overall with no new medical concerns.   Today, she reports that she has forgotten to take two doses of her vitamin B12 1000 ug shots, but has otherwise been compliant and has tolerated it well. In regards to her Crohn's disease, she denies any GI bleeding. She continues to be on Stelara.  She reports that she had previously been taking oral iron since April. However, she discontinued her liquid iron polysaccharide 5 mL 1x p.o daily in December because she believes that it caused highly elevated triglyceride levels of 850. She notes that after stopping for 6 weeks, her triglycerides lowered. Patient is not on insulin or any diuretic medications.  MEDICAL HISTORY:  Past Medical History:  Diagnosis Date   Anemia    related to Crohns flares   Arm DVT (deep venous thromboembolism), acute, right (HCC) 11/29/2017   Arthritis    knees, feet, hands, wrists, related to Crohns flare   Asthma    Childhood   B12 deficiency    monitored/treated by Dr. Leone Payor   Cervical dysplasia    Clotting disorder (HCC)    on eliquis hx dvt/ pulmonary embolism   COVID 04/13/2021   Crohn disease (HCC)     Crohn's disease of small intestine (HCC) 06/24/2012   Diagnosed 1999, in [place]. Ileitis only then. Treated with Imuran Remicade and prednisone.  Noncompliant with therapy. 2004 return to care and was treated with Remicade prednisone Pentasa Cipro and Flagyl. 2006 status post right hemicolectomy. Subsequently treated with azathioprine and Cimzia. 200 mg every other week.  azathioprine was added in 2010. Prometheus TP MT enzyme was negative.   Depression    DVT of upper extremity (deep vein thrombosis) (HCC) 11/28/2017   Eczema    arms and behind knees, worse in winter   Generalized anxiety disorder    History of recurrent UTI (urinary tract infection)    HPV (human papilloma virus) infection    Hyperlipemia    Hypertriglyceridemia 07/03/2012   10/2010 labs showed level of 753 with total cholesterol 180 and HDL 41' Labs 05/2013--TG 180    Major depressive disorder in remission (HCC) 10/03/2013   MS (multiple sclerosis) (HCC)    Multiple sclerosis (HCC) 2019   Officially diagnosed in the Fall   Neuromuscular disorder (HCC)    Osteopenia 09/29/2011   T-1.6   Papanicolaou smear 10/2011   last abnormal 2011   Pulmonary embolism (HCC) 11/30/2017   Scaphoid fracture of wrist 2013   left   Seasonal allergic rhinitis    Shingles 09/2015   R hip   Vitamin B12 deficiency 06/19/2012   Vitamin D deficiency 12/16/2015   Vitamin D-OH level 12     SURGICAL HISTORY: Past Surgical History:  Procedure Laterality Date   APPENDECTOMY  CERVICAL BIOPSY  W/ LOOP ELECTRODE EXCISION  11/02/2007   ---paps normal since   COLONOSCOPY  multiple   scanned   ESOPHAGOGASTRODUODENOSCOPY  multiple   scanned   HEMICOLECTOMY  11/01/2004   ILEOCECETOMY  12/11/2021   and end ileostomy; at [facility]   IR ANGIOGRAM PULMONARY BILATERAL SELECTIVE  12/03/2017   IR ANGIOGRAM SELECTIVE EACH ADDITIONAL VESSEL  12/03/2017   IR ANGIOGRAM SELECTIVE EACH ADDITIONAL VESSEL  12/03/2017   IR INFUSION THROMBOL ARTERIAL  INITIAL (MS)  12/03/2017   IR INFUSION THROMBOL ARTERIAL INITIAL (MS)  12/03/2017   IR IVC FILTER PLMT / S&I /IMG GUID/MOD SED  12/04/2017   IR THROMB F/U EVAL ART/VEN FINAL DAY (MS)  12/04/2017   IR US GUIDE VASC ACCESS RIGHT  12/03/2017   IVC FILTER REMOVAL N/A 07/11/2018   Procedure: IVC FILTER REMOVAL;  Surgeon: Nada Libman, MD;  [facility]: [facility];  Service: Cardiovascular;  Laterality: N/A;   IVC VENOGRAPHY N/A 07/11/2018   Procedure: IVC Venography;  Surgeon: Nada Libman, MD;  Location: [facility];  Service: Cardiovascular;  Laterality: N/A;   LEEP  11/01/2009   --done in [place]   PERIPHERAL VASCULAR BALLOON ANGIOPLASTY Right 11/30/2017   Procedure: PERIPHERAL VASCULAR BALLOON ANGIOPLASTY;  Surgeon: Nada Libman, MD;  Location: [facility];  Service: Cardiovascular;  Laterality: Right;   PERIPHERAL VASCULAR THROMBECTOMY N/A 11/29/2017   Procedure: PERIPHERAL VASCULAR THROMBECTOMY - THROMBOLYSIS;  Surgeon: Nada Libman, MD;  Location: [facility];  Service: Cardiovascular;  Laterality: N/A;  LYSIS CATHETER PLACEMENT ONLY   PERIPHERAL VASCULAR THROMBECTOMY N/A 11/30/2017   Procedure: PERIPHERAL VASCULAR THROMBECTOMY - Lysis Recheck;  Surgeon: Nada Libman, MD;  [facility]: [facility];  Service: Cardiovascular;  Laterality: N/A;   WISDOM TOOTH EXTRACTION      SOCIAL HISTORY: Social History   Socioeconomic History   Marital status: Married    Spouse name: Lindsay Mccarthy   Number of children: 0   Years of education: 14   Highest education level: Associate degree: occupational, Scientist, product/process development, or vocational program  Occupational History   Occupation: Theatre manager: Advertising copywriter  Tobacco Use   Smoking status: Former    Packs/day: 1.00    Years: 4.00    Additional pack years: 0.00    Total pack years: 4.00    Types: Cigarettes    Quit date: 11/18/1998    Years since quitting: 24.2   Smokeless tobacco:  Never  Vaping Use   Vaping Use: Never used  Substance and Sexual Activity   Alcohol use: Not Currently   Drug use: Yes    Frequency: 4.0 times per week    Types: Marijuana    Comment: smokes some at bedtime 3-4 nights/week (helps with spasms)   Sexual activity: Yes    Partners: Male    Birth control/protection: I.U.D.    Comment: Mirena inserted 01/2016  Other Topics Concern   Not on file  Social History Narrative   The patient is divorced.     Re-married Maisie Fus, partner of 10 years, on 10/10/16. 2 dogs, 1 cat. 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   Moved from [place] to [place] in 2013.   Past smoker   No alcohol   2-3 caffeinated beverages a day   She reports she is compliant with sunscreen given her increased risk  of sun damage and skin cancer (no longer on azathioprine)      Updated 01/01/23   Social Determinants of Health   Financial Resource Strain: Not on file  Food Insecurity: Not on file  Transportation Needs: Not on file  Physical Activity: Not on file  Stress: Not on file  Social Connections: Not on file  Intimate Partner Violence: Not on file    FAMILY HISTORY: Family History  Problem Relation Age of Onset   Hypertension Mother    Breast cancer Mother 75   Bone cancer Mother        metastatic from breast   Depression Mother    Hypothyroidism Mother    Colon polyps Father    Diabetes Father        borderline--resolved 2020   Hypertension Father        off meds now   Hyperlipidemia Father        off meds now   Fuch's dystrophy Father    Multiple sclerosis Sister        affecting brain only   Alcohol abuse Sister    Fuch's dystrophy Sister    Hypertension Sister    Heart disease Sister        poss MI in 41's   Stroke Maternal Grandmother    Lung cancer Maternal Grandmother    Hypertension Maternal Grandmother    Heart disease Paternal Grandmother     Hypertension Paternal Grandmother    Colon cancer Paternal Grandfather    Stomach cancer Neg Hx    Esophageal cancer Neg Hx    Pancreatic cancer Neg Hx     ALLERGIES:  is allergic to iron, iron sucrose, ibuprofen, morphine and related, ofatumumab, adhesive [tape], and prednisone.  MEDICATIONS:  Current Outpatient Medications  Medication Sig Dispense Refill   acetaminophen (TYLENOL) 500 MG tablet Take 1,000 mg by mouth every 6 (six) hours as needed for headache (pain). Reported on 12/15/2015 (Patient not taking: Reported on 01/24/2023)     baclofen (LIORESAL) 10 MG tablet TAKE 1 TABLET(10 MG) BY MOUTH THREE TIMES DAILY AS NEEDED FOR MUSCLE SPASMS 90 tablet 5   buPROPion (WELLBUTRIN) 100 MG tablet TAKE 1 TABLET(100 MG) BY MOUTH TWICE DAILY 180 tablet 3   cetirizine (ZYRTEC) 10 MG tablet Take 10 mg by mouth at bedtime.      cyanocobalamin (DODEX) 1000 MCG/ML injection INJECT 1 ML INTO THE SKIN EVERY 14 DAYS 10 mL 4   dalfampridine 10 MG TB12 TAKE 1 TABLET BY MOUTH EVERY 12  HOURS 60 tablet 0   ELIQUIS 5 MG TABS tablet TAKE 1 TABLET BY MOUTH TWICE  DAILY 180 tablet 3   escitalopram (LEXAPRO) 10 MG tablet TAKE 1 TABLET BY MOUTH AT  BEDTIME 90 tablet 1   fenofibrate 160 MG tablet TAKE 1 TABLET BY MOUTH AT  BEDTIME 90 tablet 1   gabapentin (NEURONTIN) 100 MG capsule TAKE 3 CAPSULES BY MOUTH AT  BEDTIME 270 capsule 1   Insulin Pen Needle 33G X 5 MM MISC 1 each by Does not apply route every 14 (fourteen) days. 100 each 2   Insulin Syringe 27G X 1/2" 1 ML MISC 1 each by Does not apply route every 14 (fourteen) days. For [place] B12 injection 24 each 1   levonorgestrel (MIRENA) 20 MCG/24HR IUD 1 each by Intrauterine route once. Implanted April 2017     NON FORMULARY Take 1 each by mouth at bedtime. CBD/ cannabis (Patient not taking: Reported on 10/11/2022)  rosuvastatin (CRESTOR) 5 MG tablet Take 1 tablet (5 mg total) by mouth daily. 30 tablet 1   ustekinumab (STELARA) 90 MG/ML SOSY injection Inject 1 mL  (90 mg total) into the skin every 8 (eight) weeks. 1 mL 5   VITAMIN D PO Take 10,000 Int'l Units by mouth 2 (two) times daily.     No current facility-administered medications for this visit.    REVIEW OF SYSTEMS:    10 Point review of Systems was done is negative except as noted above.   PHYSICAL EXAMINATION: Telemedicine appointment  LABORATORY DATA:  I have reviewed the data as listed  .    Latest Ref Rng & Units 02/22/2023    3:58 PM 10/04/2022    9:05 AM 07/13/2022   12:48 PM  CBC  WBC 4.0 - 10.5 K/uL 8.5  7.8  8.9   Hemoglobin 12.0 - 15.0 g/dL 21.3  08.6  57.8   Hematocrit 36.0 - 46.0 % 42.8  45.2  41.6   Platelets 150 - 400 K/uL 373  343  411    CBC    Component Value Date/Time   WBC 8.5 02/22/2023 1558   WBC 8.9 07/13/2022 1248   RBC 4.63 02/22/2023 1558   HGB 14.1 02/22/2023 1558   HGB 15.1 10/04/2022 0905   HCT 42.8 02/22/2023 1558   HCT 45.2 10/04/2022 0905   PLT 373 02/22/2023 1558   PLT 343 10/04/2022 0905   MCV 92.4 02/22/2023 1558   MCV 91 10/04/2022 0905   MCH 30.5 02/22/2023 1558   MCHC 32.9 02/22/2023 1558   RDW 13.0 02/22/2023 1558   RDW 12.7 10/04/2022 0905   LYMPHSABS 1.3 02/22/2023 1558   LYMPHSABS 1.2 10/04/2022 0905   MONOABS 0.8 02/22/2023 1558   EOSABS 0.2 02/22/2023 1558   EOSABS 0.2 10/04/2022 0905   BASOSABS 0.0 02/22/2023 1558   BASOSABS 0.0 10/04/2022 0905       Latest Ref Rng & Units 02/22/2023    3:58 PM 07/13/2022   12:48 PM 07/12/2022    4:02 PM  CMP  Glucose 70 - 99 mg/dL 469  82  629   BUN 6 - 20 mg/dL Creatinine 0.44 - 1.00 mg/dL 5.28  4.13  2.44   Sodium 135 - 145 mmol/L 139  138  136   Potassium 3.5 - 5.1 mmol/L 4.4  4.2  3.7   Chloride 98 - 111 mmol/L 105  103  103   CO2 22 - 32 mmol/L Calcium 8.9 - 10.3 mg/dL 9.5  9.2  9.1   Total Protein 6.5 - 8.1 g/dL 7.3  7.6  7.3   Total Bilirubin 0.3 - 1.2 mg/dL 0.3  0.4  0.3   Alkaline Phos 38 - 126 U/L 51  102  104   AST 15 - 41 U/L ALT 0 - 44 U/L 13  41  45    . Lab Results  Component Value Date   IRON 96 02/22/2023   TIBC 661 (H) 02/22/2023   IRONPCTSAT 15 02/22/2023   (Iron and TIBC)  Lab Results  Component Value Date   FERRITIN 14 02/22/2023     Lupus anticoagulant panel      The value has a corrected status.      No reference range information available      Resulting Lab: [facility]  Comments:           (NOTE)           No lupus anticoagulant was detected  RADIOGRAPHIC STUDIES: I have personally reviewed the radiological images as listed and agreed with the findings in the report. No results found.   ASSESSMENT & PLAN:  KIPPY MELENA is a 47 y.o. caucasian female with  1. Acute DVT and Submassive PE No definitive provocation of DVT. PE likely from dislodged DVT after mechanical thrombectomy and balloon venoplasty Has IVC filter placed.  On Eliquis since 12/2017 Non smoker. No recent use of hormonal contracept. Prothrombin gene mutation/Factor V leiden mutation negative, Anti-phoshopholipid ab negative. Jak2 testing results negative. Lupus negative. Based on results, elevated platelets likely had a reactive cause due to inflammation from Crohn's and elevated iron.   There is no height or weight on file to calculate BMI. - obesity could an additional contributing risk factor. 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  2. Elevated platelets  Have been elevated 300-800K for over a year  -likely reactive to iron deficiency anemia and inflammation from Crohn's disease. Platelets have improved in tandem with improvement in iron levels. PLT are normal today.  3. Iron Deficiency Anemia . Lab Results  Component Value Date   IRON 96 02/22/2023   TIBC 661 (H) 02/22/2023   IRONPCTSAT 15 02/22/2023   (Iron and TIBC)  Lab Results  Component Value Date   FERRITIN 14  02/22/2023  -on ferrous sulfate BID. Tolerating well but has stop Po iron about 1-2 months ago. -continue PO iron to maintain ferritin levels >50. -f/u with PCP for continue mx of this.  4. Vitamin B12 deficiency  -seen at 1443 on 01/09/18 labs  -Currently on every 14 days B12 injections with PCP, will continue    5. Crohn's Disease -Mx by Dr. Leone Payor -On Imuran and Entyvio   PLAN:   -Discussed lab results on 02/22/2023 in detail with patient. CBC showed WBC of 8.5K, hemoglobin of 14.1, and platelets of 373K. -no anemia -iron saturation 15% -Patient's ferritin level is currently 14, discussed goal to keep Ferretin levels at 50 -Patient is interested in trying a new oral iron option as she believes that  iron polysaccharide 5 mL 1x p.o daily contributes to elevated triglyceride levels -Will start OTC Ferrous sulfate at least once a day, up to twice daily. Patient has tolerated this well previously -Continue oral iron management with PCP in three months -Continue 1000 mcg Vitamin B12 [place] every 2 weeks. -Continue Eliquis -will consider IV iron if oral iron does not improve iron levels or patient starts to become anemic -She has been tolerating her vitamin B12 1000 ug shots well with no prohibitive toxicities at this time.  -Continue to optimize B12 levels with B12 1000 ug shots  FOLLOW-UP: RTC with Dr Candise Che as needed RTC with PCP  The total time spent in the appointment was 21 minutes* .  All of the patient's questions were answered with apparent satisfaction. The patient knows to call the clinic with any problems, questions or concerns.   Wyvonnia Lora MD MS AAHIVMS [facility] Hematology/Oncology Physician [facility]  .*Total Encounter Time as defined by the Centers for Medicare and Medicaid Services includes, in addition to the face-to-face time of a patient visit (documented in the note above) non-face-to-face time: obtaining and reviewing outside history, ordering  and reviewing medications, tests or procedures, care coordination (communications with other health care professionals or caregivers) and documentation in the medical record.    I,Mitra Faeizi,acting as a Neurosurgeon for Wyvonnia Lora, MD.,have documented all relevant documentation on the behalf of Wyvonnia Lora, MD,as directed by  Wyvonnia Lora, MD while in the presence of Wyvonnia Lora, MD.  .I have reviewed the above documentation for accuracy and completeness, and I agree with the above. Johney Maine MD

## 2023-03-02 ENCOUNTER — Encounter: Payer: Self-pay | Admitting: Hematology

## 2023-03-03 ENCOUNTER — Other Ambulatory Visit: Payer: Self-pay | Admitting: Neurology

## 2023-03-03 ENCOUNTER — Encounter: Payer: Self-pay | Admitting: Family Medicine

## 2023-03-07 ENCOUNTER — Other Ambulatory Visit: Payer: 59

## 2023-03-08 ENCOUNTER — Other Ambulatory Visit: Payer: Self-pay | Admitting: Neurology

## 2023-03-09 ENCOUNTER — Encounter: Payer: Self-pay | Admitting: Neurology

## 2023-03-09 ENCOUNTER — Other Ambulatory Visit: Payer: Self-pay

## 2023-03-09 ENCOUNTER — Other Ambulatory Visit: Payer: 59

## 2023-03-09 DIAGNOSIS — R7989 Other specified abnormal findings of blood chemistry: Secondary | ICD-10-CM

## 2023-03-09 DIAGNOSIS — E781 Pure hyperglyceridemia: Secondary | ICD-10-CM

## 2023-03-09 DIAGNOSIS — G35 Multiple sclerosis: Secondary | ICD-10-CM

## 2023-03-09 DIAGNOSIS — M62838 Other muscle spasm: Secondary | ICD-10-CM

## 2023-03-09 LAB — HEPATIC FUNCTION PANEL
Albumin: 4.3 g/dL (ref 3.9–4.9)
Total Protein: 7.2 g/dL (ref 6.0–8.5)

## 2023-03-09 LAB — LIPID PANEL: VLDL Cholesterol Cal: 26 mg/dL (ref 5–40)

## 2023-03-09 MED ORDER — BACLOFEN 10 MG PO TABS
ORAL_TABLET | ORAL | 5 refills | Status: DC
Start: 2023-03-09 — End: 2024-02-09

## 2023-03-09 MED ORDER — DALFAMPRIDINE ER 10 MG PO TB12: 10.0000 mg | ORAL_TABLET | Freq: Two times a day (BID) | ORAL | 5 refills | Status: DC

## 2023-03-10 ENCOUNTER — Encounter: Payer: Self-pay | Admitting: Hematology

## 2023-03-10 LAB — HEPATIC FUNCTION PANEL
ALT: 15 IU/L (ref 0–32)
AST: 20 IU/L (ref 0–40)
Alkaline Phosphatase: 66 IU/L (ref 44–121)
Bilirubin Total: 0.3 mg/dL (ref 0.0–1.2)

## 2023-03-10 LAB — LIPID PANEL
Chol/HDL Ratio: 1.9 ratio (ref 0.0–4.4)
Cholesterol, Total: 124 mg/dL (ref 100–199)
HDL: 65 mg/dL (ref >39)
LDL Chol Calc (NIH): 33 mg/dL (ref 0–99)
Triglycerides: 162 mg/dL — ABNORMAL HIGH (ref 0–149)

## 2023-03-11 ENCOUNTER — Encounter: Payer: Self-pay | Admitting: Internal Medicine

## 2023-03-11 ENCOUNTER — Other Ambulatory Visit: Payer: Self-pay | Admitting: Hematology

## 2023-03-17 ENCOUNTER — Other Ambulatory Visit: Payer: Self-pay | Admitting: Hematology

## 2023-03-18 ENCOUNTER — Other Ambulatory Visit: Payer: Self-pay | Admitting: Hematology

## 2023-03-18 ENCOUNTER — Telehealth: Payer: Self-pay | Admitting: Hematology

## 2023-03-18 ENCOUNTER — Encounter: Payer: Self-pay | Admitting: Hematology

## 2023-03-21 ENCOUNTER — Inpatient Hospital Stay: Payer: 59 | Attending: Hematology

## 2023-03-21 ENCOUNTER — Other Ambulatory Visit: Payer: Self-pay

## 2023-03-21 VITALS — BP 118/60 | HR 78 | Temp 98.2°F | Resp 18

## 2023-03-21 DIAGNOSIS — D509 Iron deficiency anemia, unspecified: Secondary | ICD-10-CM | POA: Diagnosis not present

## 2023-03-21 DIAGNOSIS — D75839 Thrombocytosis, unspecified: Secondary | ICD-10-CM | POA: Diagnosis not present

## 2023-03-21 DIAGNOSIS — E538 Deficiency of other specified B group vitamins: Secondary | ICD-10-CM | POA: Diagnosis not present

## 2023-03-21 DIAGNOSIS — K5090 Crohn's disease, unspecified, without complications: Secondary | ICD-10-CM | POA: Insufficient documentation

## 2023-03-21 DIAGNOSIS — Z86711 Personal history of pulmonary embolism: Secondary | ICD-10-CM | POA: Insufficient documentation

## 2023-03-21 DIAGNOSIS — Z86718 Personal history of other venous thrombosis and embolism: Secondary | ICD-10-CM | POA: Diagnosis not present

## 2023-03-21 DIAGNOSIS — D500 Iron deficiency anemia secondary to blood loss (chronic): Secondary | ICD-10-CM

## 2023-03-21 MED ORDER — DIPHENHYDRAMINE HCL 25 MG PO CAPS
50.0000 mg | ORAL_CAPSULE | Freq: Once | ORAL | Status: AC
  Administered 2023-03-21: 50 mg via ORAL
  Filled 2023-03-21: qty 2

## 2023-03-21 MED ORDER — SODIUM CHLORIDE 0.9 % IV SOLN
50.0000 mg | Freq: Once | INTRAVENOUS | Status: AC
  Administered 2023-03-21: 50 mg via INTRAVENOUS
  Filled 2023-03-21: qty 1

## 2023-03-21 MED ORDER — ACETAMINOPHEN 500 MG PO TABS
1000.0000 mg | ORAL_TABLET | Freq: Once | ORAL | Status: AC
  Administered 2023-03-21: 1000 mg via ORAL
  Filled 2023-03-21: qty 2

## 2023-03-21 MED ORDER — FAMOTIDINE 20 MG IN NS 100 ML IVPB: 20.0000 mg | Freq: Once | INTRAVENOUS | Status: DC

## 2023-03-21 MED ORDER — SODIUM CHLORIDE 0.9 % IV SOLN
1000.0000 mg | Freq: Once | INTRAVENOUS | Status: AC
  Administered 2023-03-21: 1000 mg via INTRAVENOUS
  Filled 2023-03-21: qty 20

## 2023-03-21 MED ORDER — SODIUM CHLORIDE 0.9 % IV SOLN: Freq: Once | INTRAVENOUS | Status: AC

## 2023-03-21 MED ORDER — METHYLPREDNISOLONE SODIUM SUCC 125 MG IJ SOLR
125.0000 mg | Freq: Once | INTRAMUSCULAR | Status: AC
  Administered 2023-03-21: 125 mg via INTRAVENOUS
  Filled 2023-03-21: qty 2

## 2023-03-21 MED ORDER — FAMOTIDINE 20 MG IN NS 100 ML IVPB
20.0000 mg | Freq: Two times a day (BID) | INTRAVENOUS | Status: DC
  Administered 2023-03-21: 20 mg via INTRAVENOUS
  Filled 2023-03-21: qty 100

## 2023-03-21 NOTE — Progress Notes (Signed)
Patient waited 30 minutes post iron infusion with no issues. Ambulatory to the lobby

## 2023-03-21 NOTE — Patient Instructions (Signed)
Iron Dextran Injection What is this medication? IRON DEXTRAN (EYE ern DEX tran) treats low levels of iron in your body. Iron is a mineral that plays an important role in making red blood cells, which carry oxygen from your lungs to the rest of your body. This medicine may be used for other purposes; ask your health care provider or pharmacist if you have questions. COMMON BRAND NAME(S): Dexferrum, INFeD What should I tell my care team before I take this medication? They need to know if you have any of these conditions: Anemia not caused by low iron levels Heart disease High levels of iron in the blood Kidney disease Liver disease An unusual or allergic reaction to iron, other medications, foods, dyes, or preservatives Pregnant or trying to get pregnant Breastfeeding How should I use this medication? This medication is for injection into a vein or a muscle. It is given in a hospital or clinic setting. Talk to your care team about the use of this medication in children. While this medication may be prescribed for children as young as 4 months old for selected conditions, precautions do apply. Overdosage: If you think you have taken too much of this medicine contact a poison control center or emergency room at once. NOTE: This medicine is only for you. Do not share this medicine with others. What if I miss a dose? Keep appointments for follow-up doses. It is important not to miss your dose. Call your care team if you are unable to keep an appointment. What may interact with this medication? Do not take this medication with any of the following: Deferoxamine Dimercaprol Other iron products This medication may also interact with the following: Chloramphenicol Deferasirox This list may not describe all possible interactions. Give your health care provider a list of all the medicines, herbs, non-prescription drugs, or dietary supplements you use. Also tell them if you smoke, drink alcohol, or  use illegal drugs. Some items may interact with your medicine. What should I watch for while using this medication? Visit your care team for regular checks on your progress. Tell your care team if your symptoms do not start to get better or if they get worse. You may need blood work while taking this medication. You may need to eat more foods that contain iron. Talk to your care team. Foods that contain iron include whole grains/cereals, dried fruits, beans, peas, leafy green vegetables, and organ meats (liver, kidney). Long-term use of this medication may increase your risk of some cancers. Talk to your care team about your risk of cancer. What side effects may I notice from receiving this medication? Side effects that you should report to your care team as soon as possible: Allergic reactions--skin rash, itching, hives, swelling of the face, lips, tongue, or throat Low blood pressure--dizziness, feeling faint or lightheaded, blurry vision Shortness of breath Side effects that usually do not require medical attention (report to your care team if they continue or are bothersome): Flushing Headache Joint pain Muscle pain Nausea Pain, redness, or irritation at injection site This list may not describe all possible side effects. Call your doctor for medical advice about side effects. You may report side effects to FDA at 1-800-FDA-1088. Where should I keep my medication? This medication is given in a hospital or clinic. It will not be stored at home. NOTE: This sheet is a summary. It may not cover all possible information. If you have questions about this medicine, talk to your doctor, pharmacist, or health care   provider.  2023 Elsevier/Gold Standard (2022-04-28 00:00:00)  

## 2023-03-25 ENCOUNTER — Other Ambulatory Visit: Payer: Self-pay | Admitting: Family Medicine

## 2023-03-25 DIAGNOSIS — E781 Pure hyperglyceridemia: Secondary | ICD-10-CM

## 2023-04-09 ENCOUNTER — Other Ambulatory Visit: Payer: Self-pay | Admitting: Neurology

## 2023-04-25 ENCOUNTER — Encounter: Payer: Self-pay | Admitting: Hematology

## 2023-04-25 ENCOUNTER — Other Ambulatory Visit (HOSPITAL_COMMUNITY): Payer: Self-pay

## 2023-04-28 ENCOUNTER — Telehealth: Payer: Self-pay

## 2023-04-28 NOTE — Telephone Encounter (Signed)
*  [facility]  PA request received for Dalfampridine ER 10MG  er tablets  PA submitted to [facility] via CMM and is pending additional questions/determination  Key: [id number]

## 2023-05-02 NOTE — Telephone Encounter (Signed)
PA has been APPROVED from 04/28/2023-04/27/2024

## 2023-05-24 ENCOUNTER — Other Ambulatory Visit: Payer: Self-pay | Admitting: Family Medicine

## 2023-05-24 DIAGNOSIS — E781 Pure hyperglyceridemia: Secondary | ICD-10-CM

## 2023-06-14 ENCOUNTER — Encounter: Payer: Self-pay | Admitting: Family Medicine

## 2023-06-14 MED ORDER — SCOPOLAMINE 1 MG/3DAYS TD PT72: 1.0000 | MEDICATED_PATCH | TRANSDERMAL | 0 refills | Status: DC

## 2023-06-15 NOTE — Progress Notes (Signed)
47 y.o. G4P0000 Married Caucasian female here for annual exam.    Wants a new IUD.  Still has periodic spotting and has PMS symptoms.   Has a service dog, Crumpton.  She no longer uses a cane or a walker.   Going to [place] for 3 weeks.  Continues to work for [employer] from home.   PCP:   Joselyn Arrow, MD  No LMP recorded. (Menstrual status: IUD).           Sexually active: Yes.    The current method of family planning is IUD--Mirena 02/12/16.    Exercising: No.   Smoker:  former  Health Maintenance: Pap:  06/22/22- normal, neg HR HPV, 01-09-18 Neg:Neg HR HPV, 01-13-16 Neg:Neg HR HPV  History of abnormal Pap:  yes, hx of LEEP MMG:  01/21/21 Breast density Cat B, BI-RADS CAT 1 neg.  She will schedule.  Colonoscopy:   06-07-22 normal;hx of crohn;s disease  BMD:   10/14/16  Result  osteopenia TDaP:  12/15/15 Gardasil:   no HIV: neg in the past Hep C: 03/16/22 neg Screening Labs:  PCP   reports that she quit smoking about 24 years ago. Her smoking use included cigarettes. She started smoking about 28 years ago. She has a 4 pack-year smoking history. She has never used smokeless tobacco. She reports that she does not currently use alcohol. She reports current drug use. Frequency: 4.00 times per week. Drug: Marijuana.  Past Medical History:  Diagnosis Date   Anemia    related to Crohns flares   Arm DVT (deep venous thromboembolism), acute, right (HCC) 11/29/2017   Arthritis    knees, feet, hands, wrists, related to Crohns flare   Asthma    Childhood   B12 deficiency    monitored/treated by Dr. Leone Payor   Cervical dysplasia    Clotting disorder (HCC)    on eliquis hx dvt/ pulmonary embolism   COVID 04/13/2021   Crohn disease (HCC)    Crohn's disease of small intestine (HCC) 06/24/2012   Diagnosed 1999, in [place]. Ileitis only then. Treated with Imuran Remicade and prednisone.  Noncompliant with therapy. 2004 return to care and was treated with Remicade prednisone Pentasa  Cipro and Flagyl. 2006 status post right hemicolectomy. Subsequently treated with azathioprine and Cimzia. 200 mg every other week.  azathioprine was added in 2010. Prometheus TP MT enzyme was negative.   Depression    DVT of upper extremity (deep vein thrombosis) (HCC) 11/28/2017   Eczema    arms and behind knees, worse in winter   Generalized anxiety disorder    History of recurrent UTI (urinary tract infection)    HPV (human papilloma virus) infection    Hyperlipemia    Hypertriglyceridemia 07/03/2012   10/2010 labs showed level of 753 with total cholesterol 180 and HDL 41' Labs 05/2013--TG 180    Major depressive disorder in remission (HCC) 10/03/2013   MS (multiple sclerosis) (HCC)    Multiple sclerosis (HCC) 2019   Officially diagnosed in the Fall   Neuromuscular disorder (HCC)    Osteopenia 09/29/2011   T-1.6   Papanicolaou smear 10/2011   last abnormal 2011   Pulmonary embolism (HCC) 11/30/2017   Scaphoid fracture of wrist 2013   left   Seasonal allergic rhinitis    Shingles 09/2015   R hip   Vitamin B12 deficiency 06/19/2012   Vitamin D deficiency 12/16/2015   Vitamin D-OH level 12     Past Surgical History:  Procedure Laterality Date  APPENDECTOMY     CERVICAL BIOPSY  W/ LOOP ELECTRODE EXCISION  11/02/2007   ---paps normal since   COLONOSCOPY  multiple   scanned   ESOPHAGOGASTRODUODENOSCOPY  multiple   scanned   HEMICOLECTOMY  11/01/2004   ILEOCECETOMY  12/11/2021   and end ileostomy; at [facility]   IR ANGIOGRAM PULMONARY BILATERAL SELECTIVE  12/03/2017   IR ANGIOGRAM SELECTIVE EACH ADDITIONAL VESSEL  12/03/2017   IR ANGIOGRAM SELECTIVE EACH ADDITIONAL VESSEL  12/03/2017   IR INFUSION THROMBOL ARTERIAL INITIAL (MS)  12/03/2017   IR INFUSION THROMBOL ARTERIAL INITIAL (MS)  12/03/2017   IR IVC FILTER PLMT / S&I /IMG GUID/MOD SED  12/04/2017   IR THROMB F/U EVAL ART/VEN FINAL DAY (MS)  12/04/2017   IR US GUIDE VASC ACCESS RIGHT  12/03/2017   IVC FILTER REMOVAL N/A  07/11/2018   Procedure: IVC FILTER REMOVAL;  Surgeon: Nada Libman, MD;  [facility];  Service: Cardiovascular;  Laterality: N/A;   IVC VENOGRAPHY N/A 07/11/2018   Procedure: IVC Venography;  Surgeon: Nada Libman, MD;  [facility];  Service: Cardiovascular;  Laterality: N/A;   LEEP  11/01/2009   --done in [place]   PERIPHERAL VASCULAR BALLOON ANGIOPLASTY Right 11/30/2017   Procedure: PERIPHERAL VASCULAR BALLOON ANGIOPLASTY;  Surgeon: Nada Libman, MD;  [facility];  Service: Cardiovascular;  Laterality: Right;   PERIPHERAL VASCULAR THROMBECTOMY N/A 11/29/2017   Procedure: PERIPHERAL VASCULAR THROMBECTOMY - THROMBOLYSIS;  Surgeon: Nada Libman, MD;  [facility];  Service: Cardiovascular;  Laterality: N/A;  LYSIS CATHETER PLACEMENT ONLY   PERIPHERAL VASCULAR THROMBECTOMY N/A 11/30/2017   Procedure: PERIPHERAL VASCULAR THROMBECTOMY - Lysis Recheck;  Surgeon: Nada Libman, MD;  [facility];  Service: Cardiovascular;  Laterality: N/A;   WISDOM TOOTH EXTRACTION      Current Outpatient Medications  Medication Sig Dispense Refill   acetaminophen (TYLENOL) 500 MG tablet Take 1,000 mg by mouth every 6 (six) hours as needed for headache (pain). Reported on 12/15/2015     baclofen (LIORESAL) 10 MG tablet TAKE 1 TABLET(10 MG) BY MOUTH THREE TIMES DAILY AS NEEDED FOR MUSCLE SPASMS 90 tablet 5   buPROPion (WELLBUTRIN) 100 MG tablet TAKE 1 TABLET(100 MG) BY MOUTH TWICE DAILY 180 tablet 3   cetirizine (ZYRTEC) 10 MG tablet Take 10 mg by mouth at bedtime.      cyanocobalamin (DODEX) 1000 MCG/ML injection INJECT 1 ML INTO THE SKIN EVERY 14 DAYS 10 mL 4   dalfampridine 10 MG TB12 Take 1 tablet (10 mg total) by mouth every 12 (twelve) hours. 60 tablet 5   ELIQUIS 5 MG TABS tablet TAKE 1 TABLET BY MOUTH TWICE  DAILY 180 tablet 3   escitalopram (LEXAPRO) 10 MG tablet TAKE 1 TABLET BY MOUTH AT  BEDTIME 90  tablet 1   fenofibrate 160 MG tablet TAKE 1 TABLET BY MOUTH AT  BEDTIME 90 tablet 1   gabapentin (NEURONTIN) 100 MG capsule TAKE 3 CAPSULES BY MOUTH AT  BEDTIME 270 capsule 0   Insulin Pen Needle 33G X 5 MM MISC 1 each by Does not apply route every 14 (fourteen) days. 100 each 2   Insulin Syringe 27G X 1/2" 1 ML MISC 1 each by Does not apply route every 14 (fourteen) days. For [place] B12 injection 24 each 1   levonorgestrel (MIRENA) 20 MCG/24HR IUD 1 each by Intrauterine route once. Implanted April 2017     naltrexone (  DEPADE) 50 MG tablet Take 25 mg by mouth daily.     NON FORMULARY Take 1 each by mouth at bedtime. CBD/ cannabis     rosuvastatin (CRESTOR) 5 MG tablet TAKE 1 TABLET(5 MG) BY MOUTH DAILY 30 tablet 2   scopolamine (TRANSDERM-SCOP) 1 MG/3DAYS Place 1 patch (1.5 mg total) onto the skin every 3 (three) days. 4 patch 0   ustekinumab (STELARA) 90 MG/ML SOSY injection Inject 1 mL (90 mg total) into the skin every 8 (eight) weeks. 1 mL 5   VITAMIN D PO Take 10,000 Int'l Units by mouth 2 (two) times daily.     No current facility-administered medications for this visit.    Family History  Problem Relation Age of Onset   Hypertension Mother    Breast cancer Mother 30   Bone cancer Mother        metastatic from breast   Depression Mother    Hypothyroidism Mother    Colon polyps Father    Diabetes Father        borderline--resolved 2020   Hypertension Father        off meds now   Hyperlipidemia Father        off meds now   Fuch's dystrophy Father    Multiple sclerosis Sister        affecting brain only   Alcohol abuse Sister    Fuch's dystrophy Sister    Hypertension Sister    Heart disease Sister        poss MI in 36's   Stroke Maternal Grandmother    Lung cancer Maternal Grandmother    Hypertension Maternal Grandmother    Heart disease Paternal Grandmother    Hypertension Paternal Grandmother    Colon cancer Paternal Grandfather    Stomach cancer Neg Hx    Esophageal  cancer Neg Hx    Pancreatic cancer Neg Hx     Review of Systems  All other systems reviewed and are negative.   Exam:   BP 126/82 (BP Location: Left Arm, Patient Position: Sitting, Cuff Size: Normal)   Pulse 78   Ht 5' 4.5" (1.638 m)   Wt 203 lb (92.1 kg)   SpO2 98%   BMI 34.31 kg/m     General appearance: alert, cooperative and appears stated age Head: normocephalic, without obvious abnormality, atraumatic Neck: no adenopathy, supple, symmetrical, trachea midline and thyroid normal to inspection and palpation Lungs: clear to auscultation bilaterally Breasts: normal appearance, no masses or tenderness, No nipple retraction or dimpling, No nipple discharge or bleeding, No axillary adenopathy Heart: regular rate and rhythm Abdomen: ileostomy present, abdomen is soft, non-tender; no masses, no organomegaly Extremities: extremities normal, atraumatic, no cyanosis or edema Skin: skin color, texture, turgor normal. No rashes or lesions Lymph nodes: cervical, supraclavicular, and axillary nodes normal. Neurologic: grossly normal  Pelvic: External genitalia:  no lesions              No abnormal inguinal nodes palpated.              Urethra:  normal appearing urethra with no masses, tenderness or lesions              Bartholins and Skenes: normal                 Vagina: normal appearing vagina with normal color and discharge, no lesions              Cervix: no lesions.  IUD strings noted.  Pap taken: no Bimanual Exam:  Uterus:  normal size, contour, position, consistency, mobility, non-tender              Adnexa: no mass, fullness, tenderness              Rectal exam: deferred.   Chaperone was present for exam:  Warren Lacy, CMA  Assessment:   Well woman visit with gynecologic exam. Mirena IUD.  Hx LEEP, 2009.  Hx DVT 2019 upper extremity.  Hx IVC filter.   On Eliquis. Ileostomy.  Crohn's. MS.  Plan: Mammogram screening discussed. Self breast awareness  reviewed. Pap and HR HPV 2026. Guidelines for Calcium, Vitamin D, regular exercise program including cardiovascular and weight bearing exercise. Return for IUD exchange before April, 2025.   Rx for Cytotec 200 mcg the night before and the morning of the IUD insertion.  Take Tylenol 1000 mg one hour prior to procedure.  Follow up annually and prn.

## 2023-06-18 ENCOUNTER — Other Ambulatory Visit: Payer: Self-pay | Admitting: Neurology

## 2023-06-22 ENCOUNTER — Ambulatory Visit (INDEPENDENT_AMBULATORY_CARE_PROVIDER_SITE_OTHER): Payer: 59 | Admitting: Gastroenterology

## 2023-06-22 ENCOUNTER — Encounter: Payer: Self-pay | Admitting: Gastroenterology

## 2023-06-22 VITALS — BP 116/82 | HR 80 | Ht 64.0 in | Wt 201.8 lb

## 2023-06-22 DIAGNOSIS — Z932 Ileostomy status: Secondary | ICD-10-CM | POA: Insufficient documentation

## 2023-06-22 DIAGNOSIS — K50012 Crohn's disease of small intestine with intestinal obstruction: Secondary | ICD-10-CM

## 2023-06-22 DIAGNOSIS — G35 Multiple sclerosis: Secondary | ICD-10-CM | POA: Diagnosis not present

## 2023-06-22 NOTE — Patient Instructions (Addendum)
Continue Stelara.   Will discuss with Dr. Leone Payor repeat ileoscopy.   _______________________________________________________  If your blood pressure at your visit was 140/90 or greater, please contact your primary care physician to follow up on this.  _______________________________________________________  If you are age 45 or older, your body mass index should be between 23-30. Your Body mass index is 34.64 kg/m. If this is out of the aforementioned range listed, please consider follow up with your Primary Care Provider.  If you are age 71 or younger, your body mass index should be between 19-25. Your Body mass index is 34.64 kg/m. If this is out of the aformentioned range listed, please consider follow up with your Primary Care Provider.   ________________________________________________________  The [place] providers would like to encourage you to use [facility] to communicate with providers for non-urgent requests or questions.  Due to long hold times on the telephone, sending your provider a message by [facility] may be a faster and more efficient way to get a response.  Please allow 48 business hours for a response.  Please remember that this is for non-urgent requests.  _______________________________________________________

## 2023-06-22 NOTE — Progress Notes (Addendum)
06/22/2023 Lindsay Mccarthy [id number] Aug 18, 1976   HISTORY OF PRESENT ILLNESS:  This is a 47 year old female who is a patient of Dr. Marvell Fuller.  She has Crohn's disease of the small intestine with history of obstruction.  She has an ileostomy in place and has decided to keep it, does not want to undergo takedown/reversal.  She is on Stelara since about November 2023 and is doing extremely well.  Endoscopically she had multiple 1 to 2 mm ulcers seen previously in August 2023.  Since then she has obviously started the Stelara.  She feels great and says that even her MS symptoms have improved since starting this.  She has had labs performed in April and May.  Follows with Dr. Candise Che for better B12 and iron deficiency.   Of note she had been treated with Cimzia, Remicade, and Entyvio but with disease progression on all of those.  Had been on azathioprine in the past as well.  Originally had a right hemicolectomy distal small bowel resection years ago but had inflammatory ileocolonic anastomotic stricture.  In February 2023 she had an ileocecectomy and end ileostomy for fibrostenotic disease.  Pathology showed inflammation in the small bowel and microscopic changes in the colon.  This was supposed to be temporary, but she said that she actually does not want to undergo reversal/takedown.  She is having pudding consistency stools, no concerns.  Ostomy site looks good.  No bleeding.  No abdominal pain.  Past Medical History:  Diagnosis Date   Anemia    related to Crohns flares   Arm DVT (deep venous thromboembolism), acute, right (HCC) 11/29/2017   Arthritis    knees, feet, hands, wrists, related to Crohns flare   Asthma    Childhood   B12 deficiency    monitored/treated by Dr. Leone Payor   Cervical dysplasia    Clotting disorder (HCC)    on eliquis hx dvt/ pulmonary embolism   COVID 04/13/2021   Crohn disease (HCC)    Crohn's disease of small intestine (HCC) 06/24/2012   Diagnosed 1999, in  [place]. Ileitis only then. Treated with Imuran Remicade and prednisone.  Noncompliant with therapy. 2004 return to care and was treated with Remicade prednisone Pentasa Cipro and Flagyl. 2006 status post right hemicolectomy. Subsequently treated with azathioprine and Cimzia. 200 mg every other week.  azathioprine was added in 2010. Prometheus TP MT enzyme was negative.   Depression    DVT of upper extremity (deep vein thrombosis) (HCC) 11/28/2017   Eczema    arms and behind knees, worse in winter   Generalized anxiety disorder    History of recurrent UTI (urinary tract infection)    HPV (human papilloma virus) infection    Hyperlipemia    Hypertriglyceridemia 07/03/2012   10/2010 labs showed level of 753 with total cholesterol 180 and HDL 41' Labs 05/2013--TG 180    Major depressive disorder in remission (HCC) 10/03/2013   MS (multiple sclerosis) (HCC)    Multiple sclerosis (HCC) 2019   Officially diagnosed in the Fall   Neuromuscular disorder (HCC)    Osteopenia 09/29/2011   T-1.6   Papanicolaou smear 10/2011   last abnormal 2011   Pulmonary embolism (HCC) 11/30/2017   Scaphoid fracture of wrist 2013   left   Seasonal allergic rhinitis    Shingles 09/2015   R hip   Vitamin B12 deficiency 06/19/2012   Vitamin D deficiency 12/16/2015   Vitamin D-OH level 12    Past Surgical History:  Procedure Laterality Date   APPENDECTOMY     CERVICAL BIOPSY  W/ LOOP ELECTRODE EXCISION  11/02/2007   ---paps normal since   COLONOSCOPY  multiple   scanned   ESOPHAGOGASTRODUODENOSCOPY  multiple   scanned   HEMICOLECTOMY  11/01/2004   ILEOCECETOMY  12/11/2021   and end ileostomy; at [facility]   IR ANGIOGRAM PULMONARY BILATERAL SELECTIVE  12/03/2017   IR ANGIOGRAM SELECTIVE EACH ADDITIONAL VESSEL  12/03/2017   IR ANGIOGRAM SELECTIVE EACH ADDITIONAL VESSEL  12/03/2017   IR INFUSION THROMBOL ARTERIAL INITIAL (MS)  12/03/2017   IR INFUSION THROMBOL ARTERIAL INITIAL (MS)  12/03/2017   IR IVC FILTER  PLMT / S&I /IMG GUID/MOD SED  12/04/2017   IR THROMB F/U EVAL ART/VEN FINAL DAY (MS)  12/04/2017   IR US GUIDE VASC ACCESS RIGHT  12/03/2017   IVC FILTER REMOVAL N/A 07/11/2018   Procedure: IVC FILTER REMOVAL;  Surgeon: Nada Libman, MD;  [facility];  Service: Cardiovascular;  Laterality: N/A;   IVC VENOGRAPHY N/A 07/11/2018   Procedure: IVC Venography;  Surgeon: Nada Libman, MD;  [facility];  Service: Cardiovascular;  Laterality: N/A;   LEEP  11/01/2009   --done in [place]   PERIPHERAL VASCULAR BALLOON ANGIOPLASTY Right 11/30/2017   Procedure: PERIPHERAL VASCULAR BALLOON ANGIOPLASTY;  Surgeon: Nada Libman, MD;  [facility];  Service: Cardiovascular;  Laterality: Right;   PERIPHERAL VASCULAR THROMBECTOMY N/A 11/29/2017   Procedure: PERIPHERAL VASCULAR THROMBECTOMY - THROMBOLYSIS;  Surgeon: Nada Libman, MD;  [facility];  Service: Cardiovascular;  Laterality: N/A;  LYSIS CATHETER PLACEMENT ONLY   PERIPHERAL VASCULAR THROMBECTOMY N/A 11/30/2017   Procedure: PERIPHERAL VASCULAR THROMBECTOMY - Lysis Recheck;  Surgeon: Nada Libman, MD;  [facility];  Service: Cardiovascular;  Laterality: N/A;   WISDOM TOOTH EXTRACTION      reports that she quit smoking about 24 years ago. Her smoking use included cigarettes. She started smoking about 28 years ago. She has a 4 pack-year smoking history. She has never used smokeless tobacco. She reports that she does not currently use alcohol. She reports current drug use. Frequency: 4.00 times per week. Drug: Marijuana. family history includes Alcohol abuse in her sister; Bone cancer in her mother; Breast cancer (age of onset: 44) in her mother; Colon cancer in her paternal grandfather; Colon polyps in her father; Depression in her mother; Diabetes in her father; Fuch's dystrophy in her father and sister; Heart disease in her paternal grandmother and sister;  Hyperlipidemia in her father; Hypertension in her father, maternal grandmother, mother, paternal grandmother, and sister; Hypothyroidism in her mother; Lung cancer in her maternal grandmother; Multiple sclerosis in her sister; Stroke in her maternal grandmother. Allergies  Allergen Reactions   Iron Other (See Comments)    Blood in stool/Rectal Bleeding   Iron Sucrose Other (See Comments)    Fever, infusion reaction  Flu like symptoms   Ibuprofen Other (See Comments)    Avoids due to Crohns disease   Morphine And Codeine Itching    itchy   Ofatumumab Other (See Comments)    Crohn's flare   Adhesive [Tape] Rash    Rash with electrodes   Prednisone Other (See Comments) and Anxiety    "crazy", hallucinations Tolerates methylprednisolone hallucinations      Outpatient Encounter Medications as of 06/22/2023  Medication Sig   acetaminophen (TYLENOL) 500 MG tablet Take 1,000 mg by mouth every 6 (six) hours  as needed for headache (pain). Reported on 12/15/2015   baclofen (LIORESAL) 10 MG tablet TAKE 1 TABLET(10 MG) BY MOUTH THREE TIMES DAILY AS NEEDED FOR MUSCLE SPASMS   buPROPion (WELLBUTRIN) 100 MG tablet TAKE 1 TABLET(100 MG) BY MOUTH TWICE DAILY   cetirizine (ZYRTEC) 10 MG tablet Take 10 mg by mouth at bedtime.    cyanocobalamin (DODEX) 1000 MCG/ML injection INJECT 1 ML INTO THE SKIN EVERY 14 DAYS   dalfampridine 10 MG TB12 Take 1 tablet (10 mg total) by mouth every 12 (twelve) hours.   ELIQUIS 5 MG TABS tablet TAKE 1 TABLET BY MOUTH TWICE  DAILY   escitalopram (LEXAPRO) 10 MG tablet TAKE 1 TABLET BY MOUTH AT  BEDTIME   fenofibrate 160 MG tablet TAKE 1 TABLET BY MOUTH AT  BEDTIME   gabapentin (NEURONTIN) 100 MG capsule TAKE 3 CAPSULES BY MOUTH AT  BEDTIME   Insulin Pen Needle 33G X 5 MM MISC 1 each by Does not apply route every 14 (fourteen) days.   Insulin Syringe 27G X 1/2" 1 ML MISC 1 each by Does not apply route every 14 (fourteen) days. For [place] B12 injection   levonorgestrel  (MIRENA) 20 MCG/24HR IUD 1 each by Intrauterine route once. Implanted April 2017   naltrexone (DEPADE) 50 MG tablet Take 25 mg by mouth daily.   NON FORMULARY Take 1 each by mouth at bedtime. CBD/ cannabis   rosuvastatin (CRESTOR) 5 MG tablet TAKE 1 TABLET(5 MG) BY MOUTH DAILY   scopolamine (TRANSDERM-SCOP) 1 MG/3DAYS Place 1 patch (1.5 mg total) onto the skin every 3 (three) days.   ustekinumab (STELARA) 90 MG/ML SOSY injection Inject 1 mL (90 mg total) into the skin every 8 (eight) weeks.   VITAMIN D PO Take 10,000 Int'l Units by mouth 2 (two) times daily.   No facility-administered encounter medications on file as of 06/22/2023.    REVIEW OF SYSTEMS  : All other systems reviewed and negative except where noted in the History of Present Illness.   PHYSICAL EXAM: BP 116/82   Pulse 80   Ht 5\' 4"  (1.626 m)   Wt 201 lb 12.8 oz (91.5 kg)   BMI 34.64 kg/m  General: Well developed white female in no acute distress Head: Normocephalic and atraumatic Eyes:  Sclerae anicteric, conjunctiva pink. Ears: Normal auditory acuity Lungs: Clear throughout to auscultation; no W/R/R. Heart: Regular rate and rhythm; no M/R/G. Abdomen: Soft, non-distended.  BS present.  Non-tender.  Ileostomy noted with light brown stool. Musculoskeletal: Symmetrical with no gross deformities  Skin: No lesions on visible extremities Extremities: No edema  Neurological: Alert oriented x 4, grossly non-focal Psychological:  Alert and cooperative. Normal mood and affect  ASSESSMENT AND PLAN: *Crohn's disease of the small intestine with history of obstruction.  She has an ileostomy in place and has decided to keep it, does not want to undergo takedown/reversal.  She is on Stelara since about November 2023 and is doing extremely well.  Endoscopically she had multiple 1 to 2 mm ulcers seen previously in August 2023.  Since then she has obviously started the Stelara.  She feels great and says that even her MS symptoms have  improved since starting this.  She has had labs performed in April and May.  Follows with Dr. Candise Che for better B12 and iron deficiency.  Will discuss with Dr. Leone Payor if he would like to check a fecal calprotectin versus repeat ileoscopy to assess healing.  She also mentioned that she was wondering if  she could switch to more of an IBD specialist in the future.  I did mention that Dr. Doy Hutching will be coming here in January and that may be an option.   CC:  Joselyn Arrow, MD

## 2023-06-29 ENCOUNTER — Ambulatory Visit (INDEPENDENT_AMBULATORY_CARE_PROVIDER_SITE_OTHER): Payer: 59 | Admitting: Obstetrics and Gynecology

## 2023-06-29 ENCOUNTER — Encounter: Payer: Self-pay | Admitting: Obstetrics and Gynecology

## 2023-06-29 VITALS — BP 126/82 | HR 78 | Ht 64.5 in | Wt 203.0 lb

## 2023-06-29 DIAGNOSIS — Z01419 Encounter for gynecological examination (general) (routine) without abnormal findings: Secondary | ICD-10-CM | POA: Diagnosis not present

## 2023-06-29 DIAGNOSIS — Z308 Encounter for other contraceptive management: Secondary | ICD-10-CM

## 2023-06-29 MED ORDER — MISOPROSTOL 200 MCG PO TABS: ORAL_TABLET | ORAL | 0 refills | Status: DC

## 2023-06-29 NOTE — Patient Instructions (Signed)

## 2023-07-01 ENCOUNTER — Encounter: Payer: Self-pay | Admitting: Internal Medicine

## 2023-07-03 ENCOUNTER — Other Ambulatory Visit: Payer: Self-pay | Admitting: Family Medicine

## 2023-07-03 DIAGNOSIS — F325 Major depressive disorder, single episode, in full remission: Secondary | ICD-10-CM

## 2023-07-03 DIAGNOSIS — E781 Pure hyperglyceridemia: Secondary | ICD-10-CM

## 2023-07-19 ENCOUNTER — Other Ambulatory Visit: Payer: Self-pay | Admitting: Internal Medicine

## 2023-07-19 DIAGNOSIS — Z111 Encounter for screening for respiratory tuberculosis: Secondary | ICD-10-CM

## 2023-07-19 DIAGNOSIS — Z7960 Long term (current) use of unspecified immunomodulators and immunosuppressants: Secondary | ICD-10-CM

## 2023-07-19 DIAGNOSIS — K5000 Crohn's disease of small intestine without complications: Secondary | ICD-10-CM

## 2023-07-19 NOTE — Telephone Encounter (Signed)
I refilled the Stelara - please call Trish and let her know that she needs to come and do a quantiferon TB blood test ( I ordered)

## 2023-07-19 NOTE — Telephone Encounter (Signed)
Please advise 

## 2023-07-29 ENCOUNTER — Encounter: Payer: Self-pay | Admitting: Family Medicine

## 2023-08-03 ENCOUNTER — Telehealth: Payer: Self-pay | Admitting: Pharmacy Technician

## 2023-08-03 NOTE — Telephone Encounter (Signed)
Pharmacy Patient Advocate Encounter   Received notification from Fax that prior authorization for [facility] 90MG  is required/requested.   Insurance verification completed.   The patient is insured through [facility] .   Per test claim: PA required; PA submitted to [facility] via CoverMyMeds Key/confirmation #/[id number] [facility] Status is pending

## 2023-08-17 ENCOUNTER — Other Ambulatory Visit: Payer: Self-pay | Admitting: Neurology

## 2023-08-18 ENCOUNTER — Encounter: Payer: Self-pay | Admitting: Neurology

## 2023-08-19 ENCOUNTER — Other Ambulatory Visit: Payer: Self-pay | Admitting: Family Medicine

## 2023-08-19 DIAGNOSIS — E781 Pure hyperglyceridemia: Secondary | ICD-10-CM

## 2023-08-23 ENCOUNTER — Encounter: Payer: Self-pay | Admitting: Neurology

## 2023-08-25 ENCOUNTER — Other Ambulatory Visit: Payer: 59

## 2023-08-25 ENCOUNTER — Ambulatory Visit
Admission: RE | Admit: 2023-08-25 | Discharge: 2023-08-25 | Disposition: A | Discharge status: Home / Self Care | Payer: 59 | Source: Ambulatory Visit | Attending: Neurology | Admitting: Neurology

## 2023-08-25 DIAGNOSIS — G992 Myelopathy in diseases classified elsewhere: Secondary | ICD-10-CM

## 2023-08-25 DIAGNOSIS — G35 Multiple sclerosis: Secondary | ICD-10-CM

## 2023-08-25 MED ORDER — GADOPICLENOL 0.5 MMOL/ML IV SOLN
9.0000 mL | Freq: Once | INTRAVENOUS | Status: AC | PRN
  Administered 2023-08-25: 9 mL via INTRAVENOUS

## 2023-08-26 NOTE — Progress Notes (Unsigned)
Virtual Visit via Video Note  Consent was obtained for video visit:  Yes.   Answered questions that patient had about telehealth interaction:  Yes.   I discussed the limitations, risks, security and privacy concerns of performing an evaluation and management service by telemedicine. I also discussed with the patient that there may be a patient responsible charge related to this service. The patient expressed understanding and agreed to proceed.  Pt location: Home Physician Location: office Name of referring provider:  Joselyn Arrow, MD I connected with Lindsay Mccarthy at patients initiation/request on 08/29/2023 at  3:30 PM EDT by video enabled telemedicine application and verified that I am speaking with the correct person using two identifiers. Pt MRN:  [id number] Pt DOB:  [birth year] Video Participants:  Lindsay Mccarthy   Assessment/Plan:   Multiple sclerosis - due to recent surgery and changes in medication to treat her Crohn's, will defer starting a new DMT for now.  She will be on immunosuppressant therapy for Crohn's anyway.  Compressive cervical myelopathy Bilateral arm/hand numbness - may be radiculopathy vs carpal tunnel syndrome   DMT: Defer.  Instead, taking Stelara for Crohn's disease D3 5000 IU twice daily (wouldn't increase dose further) gabapentin 100-200mg  QHS, Ampyra, baclofen 10mg  TID PRN MRI of brain, cervical and thoracic spine with and without contrast in 6 months Vit D level (she will have checked with PCP) Discussed getting second opinion regarding the cervical spinal stenosis.  She will get back to me. Follow up 6 months   Subjective:  Lindsay Mccarthy is a 47 year old right-handed woman with Crohn's disease, depression, and anxiety who follows up for multiple sclerosis.    UPDATE: Current disease modifying therapy: None.  Takes Stelara for Crohn's Other medication:  D3 10,000 IU daily, B12 injection, Ampyra, gabapentin 300mg  at bedtime, baclofen 10-20mg   at bedtime, Wellbutrin, Lexapro 10mg , ,Colestid, Bentayl, Eliquis, Mirena  MRI of brain/cervical/thoracic spine with and without contrast on 08/27/2023 were stable.  Vision:  No issues Motor:  No new issues Sensory:  as above Pain:  Spasms in legs as well as abdomen. Gait:  On Ampyra.  States gait overall improved.  No longer uses a walker.  Uses the cane once in awhile in the home.  Now has a support dog to help guide. Bowel/Bladder:  Occasional urinary incontinence when stressed.  Crohn's disease.  Has ostomy Fatigue:  Some fatigue. Cognition:  Trouble multi-tasking.  She underwent neuropsychological testing on 09/04/2019.  Performance fell largely within normal limits.  She did exhibit a weakness across aspects of attention/concentration and some performance variability across processing speed.  Performance involving complex attention, executive functioning, receptive and expressive language, visual-spatial abilities and verbal and visual learning and memory were within normal range. Mood:  Stable   HISTORY: She reports history of various neurologic symptoms since her late 88s: -  Urinary incontinence:  It started about a year ago and has gotten worse.  It occurs suddenly, once or twice a month, usually at home and while standing, such as while washing dishes with the water running.  It occurs during fatigued or emotional stress as well.  She denies increased urinary frequency or dysuria.  No perineal numbness.  No low back pain.   - She reports poor night vision with difficulty seeing while driving at night. She has trouble focusing.  Recent eye exam showed poor "refocusing", which would normally be seen in an older adult.  Her sister had Fuchs dystrophy but she doesn't have it. -  She reports numbness and tingling in her hands when she wakes up in the morning or in the evening after work. -  She reports abnormal gait.  It feels like her feet are stuck in cement.  She has trouble lifting her  feet.  It causes hip pain.  She trips over her feet.  It is worse when there is an extreme change in temperature.  She started having trouble in her mid-30s.  Worse over the past 2 years.  She reports a stabbing pain in her hips or knees.  She reports previously being diagnosed with a pinched nerve in the back that caused a left foot drop in the 20s. She had PT and injections which was ineffective.  No numbness in the legs.   -  When she throws a ball, her hand doesn't release immediately.  There is a delay until she is able to release the ball.  She has similar problems with her left hand.  No neck pain. -  She reports fatigued.  She needs to take a nap during the day.  Prior sleep study was normal.  In addition to cord involvement of MS, she also has severe cervical spinal stenosis.  In November 2023, she saw neurosurgery at [facility] regarding cervical spinal stenosis causing compressive myelopathy.  As it is uncertain how much symptoms are related to MS or the C4-5 stenosis, surgery was deferred.  She underwent epidural injections which did helped with arm numbness and pain but not with balance and not effective enough that she wanted to return for repeat injections.   One of her two sisters has multiple sclerosis.   Other history:  She has Crohn's disease.  Earlier this year, she developed a DVT in her right leg.  She also was found to have B12 deficiency and is being treated with injections.   01/09/18 LABS:  B12 143, Sed rate 18.  Hypercoagulable panel unremarkable, including lupus anticoagulant panel, dRVVT mix, beta-2-glycoprotein, cardiolipin antibodies, prothrombin gene mutation, factor V Leiden.  NMO/AQP4+ antibody was negative.  Her insurance would not cover anti-MOG antibody testing.   09/2018 LABS:  JC Virus antibody was positive with index of 3.26.  Quantiferon-TB Gold Plus negative.  She tested negative for Hep B.      Imaging: 07/22/2018:  MRI C & T-SPINE W WO:  Multiple  T2/STIR hyperintense signal within the spinal cord at C2, C3-C4, C6-C7, T1, T3-T4, T6-T7, and T12 levels, all non-enhancing.  She also demonstrates degenerative spinal stenosis at C4-C5 and C5-C6 with mild spinal cord mass effect. 07/24/2018:  MRI BRAIN W WO:  Mild hyperintense in the periventricular and juxtacortical white matter with signal running perpendicular from the lateral ventricle and involving the corpus callosum, non-enhancing. 04/05/2020 MRI C & T-SPINE W WO:  Chronic patchy multifocal cord signal abnormality throughout cervical and thoracic cord, overall stable compared to 2019.  No active lesions.  Degenerative cervical spondylosis at C4-5 and C5-6 with mild to moderate spinal stenosis and moderate left C5 foraminal narrowing. 05/02/2020 MRI BRAIN W WO:  Unchanged mild subcortical/juxtacortical and deep periventricular white matter foci of signal abnormality consistent with the provided history of multiple sclerosis. No new or enhancing lesion is identified. 07/07/2021 MRI BRAIN W WO:  Similar mild T2 white matter hyperintensities, compatible with the reported history of multiple sclerosis. No convincing new or enhancing lesions identified. 07/19/2021 MRI C-SPINE W WO:  1. Multiple T2 hyperintense cord lesions consistent with the clinical diagnosis of multiple sclerosis. No new  or enhancing lesion identified.  2. Degenerative changes at C4-5 with mild progression of spinal canal stenosis which is moderate, with mass effect on the cord.  There is also mild-to-moderate right and moderate left neural foraminal narrowing at this level. 07/24/2022 MRI BRAIN W WO:  1. Unchanged mild T2 hyperintense foci. No new or enhancing lesions to suggest progressive or active demyelination.  2. No acute intracranial process. 07/17/2022 MRI C & T-SPINE W WO:  1. Redemonstrated scattered T2 hyperintense lesions within the cervical and thoracic spinal cord, not significantly changed from prior exam, and  compatible with demyelinating disease. No evidence of contrast enhancement to suggest active demyelination.  2. Compared to prior exam there is new T2 hyperintense signal within the central spinal cord a the C4-C5 and T6-T7 levels where there is moderate to severe spinal canal stenosis. This is favored to represent changes related to chronic compressive myelopathy.  Past DMT:  Ocrevus (effective but would develop recurrence of chronic symptoms prior to next infusion), Kesimpta (possibly caused Crohn's flare)  Past medications:  gabapentin    Past Medical History: Past Medical History:  Diagnosis Date   Anemia    related to Crohns flares   Arm DVT (deep venous thromboembolism), acute, right (HCC) 11/29/2017   Arthritis    knees, feet, hands, wrists, related to Crohns flare   Asthma    Childhood   B12 deficiency    monitored/treated by Dr. Leone Payor   Cervical dysplasia    Clotting disorder (HCC)    on eliquis hx dvt/ pulmonary embolism   COVID 04/13/2021   Crohn disease (HCC)    Crohn's disease of small intestine (HCC) 06/24/2012   Diagnosed 1999, in [place]. Ileitis only then. Treated with Imuran Remicade and prednisone.  Noncompliant with therapy. 2004 return to care and was treated with Remicade prednisone Pentasa Cipro and Flagyl. 2006 status post right hemicolectomy. Subsequently treated with azathioprine and Cimzia. 200 mg every other week.  azathioprine was added in 2010. Prometheus TP MT enzyme was negative.   Depression    DVT of upper extremity (deep vein thrombosis) (HCC) 11/28/2017   Eczema    arms and behind knees, worse in winter   Generalized anxiety disorder    History of recurrent UTI (urinary tract infection)    HPV (human papilloma virus) infection    Hyperlipemia    Hypertriglyceridemia 07/03/2012   10/2010 labs showed level of 753 with total cholesterol 180 and HDL 41' Labs 05/2013--TG 180    Major depressive disorder in remission (HCC) 10/03/2013   MS  (multiple sclerosis) (HCC)    Multiple sclerosis (HCC) 2019   Officially diagnosed in the Fall   Neuromuscular disorder (HCC)    Osteopenia 09/29/2011   T-1.6   Papanicolaou smear 10/2011   last abnormal 2011   Pulmonary embolism (HCC) 11/30/2017   Scaphoid fracture of wrist 2013   left   Seasonal allergic rhinitis    Shingles 09/2015   R hip   Vitamin B12 deficiency 06/19/2012   Vitamin D deficiency 12/16/2015   Vitamin D-OH level 12     Medications: Outpatient Encounter Medications as of 08/29/2023  Medication Sig Note   acetaminophen (TYLENOL) 500 MG tablet Take 1,000 mg by mouth every 6 (six) hours as needed for headache (pain). Reported on 12/15/2015 08/30/2022: prn   baclofen (LIORESAL) 10 MG tablet TAKE 1 TABLET(10 MG) BY MOUTH THREE TIMES DAILY AS NEEDED FOR MUSCLE SPASMS    buPROPion (WELLBUTRIN) 100 MG tablet TAKE 1 TABLET(100 MG)  BY MOUTH TWICE DAILY    cetirizine (ZYRTEC) 10 MG tablet Take 10 mg by mouth at bedtime.     cyanocobalamin (DODEX) 1000 MCG/ML injection INJECT 1 ML INTO THE SKIN EVERY 14 DAYS    dalfampridine 10 MG TB12 TAKE 1 TABLET BY MOUTH EVERY 12  HOURS    ELIQUIS 5 MG TABS tablet TAKE 1 TABLET BY MOUTH TWICE  DAILY    escitalopram (LEXAPRO) 10 MG tablet TAKE 1 TABLET BY MOUTH AT  BEDTIME    fenofibrate 160 MG tablet TAKE 1 TABLET BY MOUTH AT  BEDTIME    gabapentin (NEURONTIN) 100 MG capsule TAKE 3 CAPSULES BY MOUTH AT  BEDTIME    Insulin Pen Needle 33G X 5 MM MISC 1 each by Does not apply route every 14 (fourteen) days.    Insulin Syringe 27G X 1/2" 1 ML MISC 1 each by Does not apply route every 14 (fourteen) days. For Walker B12 injection    levonorgestrel (MIRENA) 20 MCG/24HR IUD 1 each by Intrauterine route once. Implanted April 2017    misoprostol (CYTOTEC) 200 MCG tablet Place one tablet (200 mcg) in the vagina the night before the IUD insertion, and then place one tablet in the vaginal the morning of the IUD insertion.    naltrexone (DEPADE) 50 MG  tablet Take 25 mg by mouth daily.    NON FORMULARY Take 1 each by mouth at bedtime. CBD/ cannabis 05/19/2022: prn   rosuvastatin (CRESTOR) 5 MG tablet TAKE 1 TABLET(5 MG) BY MOUTH DAILY    scopolamine (TRANSDERM-SCOP) 1 MG/3DAYS Place 1 patch (1.5 mg total) onto the skin every 3 (three) days.    STELARA 90 MG/ML SOSY injection INJECT 1 SYRINGE SUBCUTANEOUSLY  EVERY 8 WEEKS    VITAMIN D PO Take 10,000 Int'l Units by mouth 2 (two) times daily.    No facility-administered encounter medications on file as of 08/29/2023.    Allergies: Allergies  Allergen Reactions   Iron Other (See Comments)    Blood in stool/Rectal Bleeding   Iron Sucrose Other (See Comments)    Fever, infusion reaction  Flu like symptoms   Ibuprofen Other (See Comments)    Avoids due to Crohns disease   Morphine And Codeine Itching    itchy   Ofatumumab Other (See Comments)    Crohn's flare   Adhesive [Tape] Rash    Rash with electrodes   Prednisone Other (See Comments) and Anxiety    "crazy", hallucinations Tolerates methylprednisolone hallucinations    Family History: Family History  Problem Relation Age of Onset   Hypertension Mother    Breast cancer Mother 67   Bone cancer Mother        metastatic from breast   Depression Mother    Hypothyroidism Mother    Colon polyps Father    Diabetes Father        borderline--resolved 2020   Hypertension Father        off meds now   Hyperlipidemia Father        off meds now   Fuch's dystrophy Father    Multiple sclerosis Sister        affecting brain only   Alcohol abuse Sister    Fuch's dystrophy Sister    Hypertension Sister    Heart disease Sister        poss MI in 83's   Stroke Maternal Grandmother    Lung cancer Maternal Grandmother    Hypertension Maternal Grandmother    Heart disease Paternal  Grandmother    Hypertension Paternal Grandmother    Colon cancer Paternal Grandfather    Stomach cancer Neg Hx    Esophageal cancer Neg Hx    Pancreatic  cancer Neg Hx     Observations/Objective:   No acute distress.  Alert and oriented.  Speech fluent and not dysarthric.  Language intact.   Follow Up Instructions:    -I discussed the assessment and treatment plan with the patient. The patient was provided an opportunity to ask questions and all were answered. The patient agreed with the plan and demonstrated an understanding of the instructions.   The patient was advised to call back or seek an in-person evaluation if the symptoms worsen or if the condition fails to improve as anticipated.   Cira Servant, DO   CC: Even Lynelle Doctor, MD

## 2023-08-27 ENCOUNTER — Ambulatory Visit
Admission: RE | Admit: 2023-08-27 | Discharge: 2023-08-27 | Disposition: A | Discharge status: Home / Self Care | Payer: 59 | Source: Ambulatory Visit | Attending: Neurology | Admitting: Neurology

## 2023-08-27 DIAGNOSIS — G35 Multiple sclerosis: Secondary | ICD-10-CM

## 2023-08-27 DIAGNOSIS — G992 Myelopathy in diseases classified elsewhere: Secondary | ICD-10-CM

## 2023-08-27 MED ORDER — GADOPICLENOL 0.5 MMOL/ML IV SOLN
9.0000 mL | Freq: Once | INTRAVENOUS | Status: AC | PRN
Start: 2023-08-27 — End: 2023-08-27
  Administered 2023-08-27: 9 mL via INTRAVENOUS

## 2023-08-28 ENCOUNTER — Encounter: Payer: Self-pay | Admitting: Neurology

## 2023-08-29 ENCOUNTER — Telehealth (INDEPENDENT_AMBULATORY_CARE_PROVIDER_SITE_OTHER): Payer: 59 | Admitting: Neurology

## 2023-08-29 ENCOUNTER — Encounter: Payer: Self-pay | Admitting: Neurology

## 2023-08-29 DIAGNOSIS — G992 Myelopathy in diseases classified elsewhere: Secondary | ICD-10-CM | POA: Diagnosis not present

## 2023-08-29 DIAGNOSIS — M4802 Spinal stenosis, cervical region: Secondary | ICD-10-CM

## 2023-08-29 DIAGNOSIS — G35 Multiple sclerosis: Secondary | ICD-10-CM

## 2023-09-16 ENCOUNTER — Other Ambulatory Visit: Payer: Self-pay | Admitting: Neurology

## 2023-09-26 ENCOUNTER — Encounter: Payer: Self-pay | Admitting: Obstetrics and Gynecology

## 2023-09-26 NOTE — Progress Notes (Unsigned)
GYNECOLOGY  VISIT   HPI: 47 y.o.   Married  Caucasian female   [id number] with No LMP recorded. (Menstrual status: IUD).   here for: IUD exchange.  Patient's service dog Myer Haff is here for the visit today.   Patient desires a new Mirena IUD.  This will be her third Mirena, which she uses for pregnancy prevention.   She used Cytotec for cervical ripening. She took two Excedrin today.   UPT negative.      GYNECOLOGIC HISTORY: No LMP recorded. (Menstrual status: IUD). Contraception:  IUD--Mirena 02/12/16 Menopausal hormone therapy:  n/a Last 2 paps:  06/22/22- normal, neg HR HPV, 01-09-18 Neg:Neg HR HPV, 01-13-16 Neg:Neg HR HPV  History of abnormal Pap or positive HPV:  yes, hx of LEEP Mammogram:  01/21/21 Breast density Cat B, BI-RADS CAT 1 neg.  She will schedule.         OB History     Gravida  0   Para  0   Term  0   Preterm  0   AB  0   Living  0      SAB  0   IAB  0   Ectopic  0   Multiple  0   Live Births                 Patient Active Problem List   Diagnosis Date Noted   Ileostomy in place [facility]) 06/22/2023   Crohn's disease of colon with intestinal obstruction (HCC) 11/12/2021   History of DVT (deep vein thrombosis) 11/12/2021   Iron deficiency anemia 08/22/2020   Multiple falls 08/21/2019   Hematoma of right flank 08/21/2019   Anticoagulant long-term use 07/17/2019   Multiple sclerosis (HCC) 12/19/2018   Pulmonary nodule 07/19/2018   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   Dvt femoral (deep venous thrombosis) (HCC) 11/30/2017   Arm DVT (deep venous thromboembolism), acute, right (HCC) 11/29/2017   Pulmonary embolus (HCC) 11/27/2017   Iron deficiency anemia due to chronic blood loss 10/05/2017   Heartburn 10/05/2017   Other fatigue 10/05/2017   Vitamin D deficiency 12/16/2015   Major depressive disorder in remission (HCC) 10/03/2013   Hypertriglyceridemia 07/03/2012   Crohn's disease of small intestine (HCC) 06/24/2012   Long-term use of  immunosuppressant medication Entyvio + ocrelizumab 06/24/2012   Vitamin B12 deficiency 06/19/2012   Osteopenia of femur neck T. score -1.4 09/29/2011    Past Medical History:  Diagnosis Date   Anemia    related to Crohns flares   Arm DVT (deep venous thromboembolism), acute, right (HCC) 11/29/2017   Arthritis    knees, feet, hands, wrists, related to Crohns flare   Asthma    Childhood   B12 deficiency    monitored/treated by Dr. Leone Payor   Cervical dysplasia    Clotting disorder (HCC)    on eliquis hx dvt/ pulmonary embolism   COVID 04/13/2021   Crohn disease (HCC)    Crohn's disease of small intestine (HCC) 06/24/2012   Diagnosed 1999, in [place]. Ileitis only then. Treated with Imuran Remicade and prednisone.  Noncompliant with therapy. 2004 return to care and was treated with Remicade prednisone Pentasa Cipro and Flagyl. 2006 status post right hemicolectomy. Subsequently treated with azathioprine and Cimzia. 200 mg every other week.  azathioprine was added in 2010. Prometheus TP MT enzyme was negative.   Depression    DVT of upper extremity (deep vein thrombosis) (HCC) 11/28/2017   Eczema    arms and behind knees, worse in winter  Generalized anxiety disorder    History of recurrent UTI (urinary tract infection)    HPV (human papilloma virus) infection    Hyperlipemia    Hypertriglyceridemia 07/03/2012   10/2010 labs showed level of 753 with total cholesterol 180 and HDL 41' Labs 05/2013--TG 180    Major depressive disorder in remission (HCC) 10/03/2013   MS (multiple sclerosis) (HCC)    Multiple sclerosis (HCC) 2019   Officially diagnosed in the Fall   Neuromuscular disorder (HCC)    Osteopenia 09/29/2011   T-1.6   Papanicolaou smear 10/2011   last abnormal 2011   Pulmonary embolism (HCC) 11/30/2017   Scaphoid fracture of wrist 2013   left   Seasonal allergic rhinitis    Shingles 09/2015   R hip   Vitamin B12 deficiency 06/19/2012   Vitamin D deficiency  12/16/2015   Vitamin D-OH level 12     Past Surgical History:  Procedure Laterality Date   APPENDECTOMY     CERVICAL BIOPSY  W/ LOOP ELECTRODE EXCISION  11/02/2007   ---paps normal since   COLONOSCOPY  multiple   scanned   ESOPHAGOGASTRODUODENOSCOPY  multiple   scanned   HEMICOLECTOMY  11/01/2004   ILEOCECETOMY  12/11/2021   and end ileostomy; at [facility]   IR ANGIOGRAM PULMONARY BILATERAL SELECTIVE  12/03/2017   IR ANGIOGRAM SELECTIVE EACH ADDITIONAL VESSEL  12/03/2017   IR ANGIOGRAM SELECTIVE EACH ADDITIONAL VESSEL  12/03/2017   IR INFUSION THROMBOL ARTERIAL INITIAL (MS)  12/03/2017   IR INFUSION THROMBOL ARTERIAL INITIAL (MS)  12/03/2017   IR IVC FILTER PLMT / S&I /IMG GUID/MOD SED  12/04/2017   IR THROMB F/U EVAL ART/VEN FINAL DAY (MS)  12/04/2017   IR US GUIDE VASC ACCESS RIGHT  12/03/2017   IVC FILTER REMOVAL N/A 07/11/2018   Procedure: IVC FILTER REMOVAL;  Surgeon: Nada Libman, MD;  Location: [facility];  Service: Cardiovascular;  Laterality: N/A;   IVC VENOGRAPHY N/A 07/11/2018   Procedure: IVC Venography;  Surgeon: Nada Libman, MD;  Location: [facility];  Service: Cardiovascular;  Laterality: N/A;   LEEP  11/01/2009   --done in [place]   PERIPHERAL VASCULAR BALLOON ANGIOPLASTY Right 11/30/2017   Procedure: PERIPHERAL VASCULAR BALLOON ANGIOPLASTY;  Surgeon: Nada Libman, MD;  Location: [facility];  Service: Cardiovascular;  Laterality: Right;   PERIPHERAL VASCULAR THROMBECTOMY N/A 11/29/2017   Procedure: PERIPHERAL VASCULAR THROMBECTOMY - THROMBOLYSIS;  Surgeon: Nada Libman, MD;  Location: [facility];  Service: Cardiovascular;  Laterality: N/A;  LYSIS CATHETER PLACEMENT ONLY   PERIPHERAL VASCULAR THROMBECTOMY N/A 11/30/2017   Procedure: PERIPHERAL VASCULAR THROMBECTOMY - Lysis Recheck;  Surgeon: Nada Libman, MD;  [facility]: [facility];  Service: Cardiovascular;  Laterality: N/A;   WISDOM TOOTH EXTRACTION       Current Outpatient Medications  Medication Sig Dispense Refill   acetaminophen (TYLENOL) 500 MG tablet Take 1,000 mg by mouth every 6 (six) hours as needed for headache (pain). Reported on 12/15/2015     baclofen (LIORESAL) 10 MG tablet TAKE 1 TABLET(10 MG) BY MOUTH THREE TIMES DAILY AS NEEDED FOR MUSCLE SPASMS 90 tablet 5   buPROPion (WELLBUTRIN) 100 MG tablet TAKE 1 TABLET(100 MG) BY MOUTH TWICE DAILY 180 tablet 3   cetirizine (ZYRTEC) 10 MG tablet Take 10 mg by mouth at bedtime.      cyanocobalamin (DODEX) 1000 MCG/ML injection INJECT 1 ML INTO THE SKIN EVERY 14 DAYS 10 mL 4   dalfampridine  10 MG TB12 TAKE 1 TABLET BY MOUTH EVERY 12  HOURS 60 tablet 1   ELIQUIS 5 MG TABS tablet TAKE 1 TABLET BY MOUTH TWICE  DAILY 180 tablet 3   escitalopram (LEXAPRO) 10 MG tablet TAKE 1 TABLET BY MOUTH AT  BEDTIME 90 tablet 1   fenofibrate 160 MG tablet TAKE 1 TABLET BY MOUTH AT  BEDTIME 90 tablet 1   gabapentin (NEURONTIN) 100 MG capsule TAKE 3 CAPSULES BY MOUTH AT  BEDTIME 270 capsule 1   Insulin Pen Needle 33G X 5 MM MISC 1 each by Does not apply route every 14 (fourteen) days. 100 each 2   Insulin Syringe 27G X 1/2" 1 ML MISC 1 each by Does not apply route every 14 (fourteen) days. For [place] B12 injection 24 each 1   levonorgestrel (MIRENA) 20 MCG/24HR IUD 1 each by Intrauterine route once. Implanted April 2017     misoprostol (CYTOTEC) 200 MCG tablet Place one tablet (200 mcg) in the vagina the night before the IUD insertion, and then place one tablet in the vaginal the morning of the IUD insertion. 2 tablet 0   naltrexone (DEPADE) 50 MG tablet Take 25 mg by mouth daily.     Naltrexone 380 MG SUSR Inject into the muscle.     NON FORMULARY Take 1 each by mouth at bedtime. CBD/ cannabis     omega-3 acid ethyl esters (LOVAZA) 1 g capsule Take 1 capsule by mouth daily.     rosuvastatin (CRESTOR) 5 MG tablet TAKE 1 TABLET(5 MG) BY MOUTH DAILY 30 tablet 2   scopolamine (TRANSDERM-SCOP) 1 MG/3DAYS Place 1  patch (1.5 mg total) onto the skin every 3 (three) days. 4 patch 0   STELARA 90 MG/ML SOSY injection INJECT 1 SYRINGE SUBCUTANEOUSLY  EVERY 8 WEEKS 1 mL 5   topiramate (TOPAMAX) 25 MG tablet Take 1 tablet every day by oral route in the evening.     VITAMIN D PO Take 10,000 Int'l Units by mouth 2 (two) times daily.     No current facility-administered medications for this visit.     ALLERGIES: Iron, Iron sucrose, Ibuprofen, Morphine and codeine, Ofatumumab, Adhesive [tape], and Prednisone  Family History  Problem Relation Age of Onset   Hypertension Mother    Breast cancer Mother 3   Bone cancer Mother        metastatic from breast   Depression Mother    Hypothyroidism Mother    Colon polyps Father    Diabetes Father        borderline--resolved 2020   Hypertension Father        off meds now   Hyperlipidemia Father        off meds now   Fuch's dystrophy Father    Multiple sclerosis Sister        affecting brain only   Alcohol abuse Sister    Fuch's dystrophy Sister    Hypertension Sister    Heart disease Sister        poss MI in 51's   Stroke Maternal Grandmother    Lung cancer Maternal Grandmother    Hypertension Maternal Grandmother    Heart disease Paternal Grandmother    Hypertension Paternal Grandmother    Colon cancer Paternal Grandfather    Stomach cancer Neg Hx    Esophageal cancer Neg Hx    Pancreatic cancer Neg Hx     Social History   Socioeconomic History   Marital status: Married    Spouse name:  Lanier Prude   Number of children: 0   Years of education: 14   Highest education level: Associate degree: occupational, Scientist, product/process development, or vocational program  Occupational History   Occupation: Theatre manager: Advertising copywriter  Tobacco Use   Smoking status: Former    Current packs/day: 0.00    Average packs/day: 1 pack/day for 4.0 years (4.0 ttl pk-yrs)    Types: Cigarettes    Start date: 11/18/1994    Quit date: 11/18/1998    Years since  quitting: 24.9   Smokeless tobacco: Never  Vaping Use   Vaping status: Never Used  Substance and Sexual Activity   Alcohol use: Not Currently   Drug use: Yes    Frequency: 4.0 times per week    Types: Marijuana    Comment: smokes some at bedtime 3-4 nights/week (helps with spasms)   Sexual activity: Yes    Partners: Male    Birth control/protection: I.U.D.    Comment: Mirena inserted 01/2016  Other Topics Concern   Not on file  Social History Narrative   The patient is divorced.     Re-married Maisie Fus, partner of 10 years, on 10/10/16. 2 dogs, 1 cat. 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   Moved from [place] to [place] in 2013.   Past smoker   No alcohol   2-3 caffeinated beverages a day   She reports she is compliant with sunscreen given her increased risk of sun damage and skin cancer (no longer on azathioprine)      Updated 01/01/23   Social Determinants of Health   Financial Resource Strain: Not on file  Food Insecurity: Not on file  Transportation Needs: Not on file  Physical Activity: Not on file  Stress: Not on file  Social Connections: Unknown (05/10/2023)   Received from [facility]   Social Network    Social Network: Not on file  Intimate Partner Violence: Unknown (05/10/2023)   Received from [facility]   HITS    Physically Hurt: Not on file    Insult or Talk Down To: Not on file    Threaten Physical Harm: Not on file    Scream or Curse: Not on file    Review of Systems  All other systems reviewed and are negative.   PHYSICAL EXAMINATION:   BP 128/84 (BP Location: Left Arm, Patient Position: Sitting, Cuff Size: Normal)   Pulse 79   Ht 5' 4.5" (1.638 m)   Wt 203 lb (92.1 kg)   SpO2 99%   BMI 34.31 kg/m     General appearance: alert, cooperative and appears stated age  Pelvic: External genitalia:  no lesions              Urethra:  normal appearing urethra with  no masses, tenderness or lesions              Bartholins and Skenes: normal                 Vagina: normal appearing vagina with normal color and discharge, no lesions              Cervix: no lesions.  IUD strings noted.                 Bimanual Exam:  Uterus:  normal size, contour, position, consistency, mobility, non-tender  Adnexa: no mass, fullness, tenderness     Procedure - IUD removal and reinsertion of new Mirena IUD.       Mirena IUD - lot [id number], exp Dec 2026. Consent for procedure.  Speculum placed in vagina.  Cytotec tabs removed.  Sterile prep of cervix with Hibiclens.  Paracervical block with 10 cc 1% lidocaine, lot number  [id number], exp 07/2025. Tenaculum to anterior cervical lip.  IUD removed, intact, with ring forceps and discarded.   Uterus sounded to just over 6 cm.  Mirena placed without difficulty. Strings trimmed. Bimanual exam repeated. Knot of strings palpable just inside the os and not visible with speculum exam.   Minimal EBL.  No complications.    Chaperone was present for exam:  Warren Lacy, CMA  ASSESSMENT:  Mirena IUD removal and reinsertion.  IUD may be low or uterine cavity may be small for the Mirena IUD.   PLAN:  Will get pelvic ultrasound.  I discussed alternative to a smaller IUD such as Kyleena.  Patient will contact me if she develops significant cramping or pain.

## 2023-10-10 ENCOUNTER — Telehealth: Payer: Self-pay | Admitting: Obstetrics and Gynecology

## 2023-10-10 ENCOUNTER — Ambulatory Visit (INDEPENDENT_AMBULATORY_CARE_PROVIDER_SITE_OTHER): Payer: 59 | Admitting: Obstetrics and Gynecology

## 2023-10-10 ENCOUNTER — Encounter: Payer: Self-pay | Admitting: Obstetrics and Gynecology

## 2023-10-10 VITALS — BP 128/84 | HR 79 | Ht 64.5 in | Wt 203.0 lb

## 2023-10-10 DIAGNOSIS — Z30431 Encounter for routine checking of intrauterine contraceptive device: Secondary | ICD-10-CM

## 2023-10-10 DIAGNOSIS — Z30433 Encounter for removal and reinsertion of intrauterine contraceptive device: Secondary | ICD-10-CM | POA: Diagnosis not present

## 2023-10-10 DIAGNOSIS — Z308 Encounter for other contraceptive management: Secondary | ICD-10-CM

## 2023-10-10 DIAGNOSIS — Z01812 Encounter for preprocedural laboratory examination: Secondary | ICD-10-CM

## 2023-10-10 LAB — PREGNANCY, URINE: Preg Test, Ur: NEGATIVE

## 2023-10-10 NOTE — Telephone Encounter (Signed)
Spoke with patient, advised order placed for PUS at outside facility, will need to contact [facility] Main Radiology Scheduling at [phone] to schedule for this week at her location of choice. Patient request MyChart message with scheduling information, message sent. Patient aware to call if any additional questions or assistance needed.   Will keep encounter open to f/u on scheduling.

## 2023-10-10 NOTE — Telephone Encounter (Signed)
Please schedule pelvic ultrasound this week if possible for IUD check following placement.   Patient not available Thursday and Friday this week.

## 2023-10-10 NOTE — Patient Instructions (Signed)
Intrauterine Device Insertion An intrauterine device (IUD) is a medical device that is inserted into the uterus to prevent pregnancy. It is a small, T-shaped device that has one or two nylon strings hanging down from it. The strings hang out of the lower part of the uterus (cervix) to allow for future IUD removal. There are two types of IUDs: Hormone IUD. This type of IUD is made of plastic and contains the hormone progestin (synthetic progesterone). A hormone IUD may last 3-5 years, depending on which one you have. Synthetic progesterone prevents pregnancy by: Thickening cervical mucus to prevent sperm from entering the uterus. Thinning the uterine lining to prevent a fertilized egg from implanting there. Copper IUD. This type of IUD has copper wire wrapped around it. A copper IUD may last up to 10 years. Copper prevents pregnancy by making the uterus and fallopian tubes produce a fluid that kills sperm. Tell a health care provider about: Any allergies you have. All medicines you are taking, including vitamins, herbs, eye drops, creams, and over-the-counter medicines. Any surgeries you have had. Any medical conditions you have, including any sexually transmitted infections (STIs) you may have. Whether you are pregnant or may be pregnant. What are the risks? Generally, this is a safe procedure. However, problems may occur, including: Infection. Bleeding. Allergic reactions to medicines. Puncture (perforation) of the uterus or damage to other structures or organs. Accidental placement of the IUD either in the muscle layer of the uterus (myometrium) or outside the uterus. The IUD falling out of the uterus (expulsion). This is more common among women who have recently had a child. Higher risk of an egg being fertilized outside your uterus (ectopic pregnancy).This is rare. Pelvic inflammatory disease (PID), which is an infection in the uterus and fallopian tubes. The IUD does not cause the  infection. The infection is usually from an unknown sexually transmitted infection (STI). This is rare, and it usually happens during the first 20 days after the IUD is inserted. What happens before the procedure? Ask your health care provider about: Changing or stopping your regular medicines. This is especially important if you are taking diabetes medicines or blood thinners. Taking over-the-counter medicines, vitamins, herbs, and supplements. Talk with your health care provider about when to schedule your IUD placement. Your health care provider may recommend taking over-the-counter pain medicines before the procedure. These medicines include ibuprofen and naproxen. You may have tests for: Pregnancy. A pregnancy test involves having a urine or blood sample taken. Sexually transmitted infections (STIs). Placing an IUD in someone who has an STI can make the infection worse. Cervical cancer. You may have a Pap test to check for this type of cancer. This means collecting cells from your cervix to be checked under a microscope. You may have a physical exam to determine the size and position of your uterus. What happens during the procedure? A tool (speculum) will be placed in your vagina and widened so that your health care provider can see your cervix. Medicine, or antiseptic, may be applied to your cervix to help lower your risk of infection. You may be given an anesthetic medicine to numb each side of your cervix. This medicine is usually given by an injection into the cervix. A tool called a uterine sound will be inserted into your uterus to check the length of your uterus and the direction that your uterus may be tilted. A slim instrument or tube (IUD inserter) that holds the IUD will be inserted into your vagina,  through your cervical canal, and into your uterus. The IUD will be placed in the uterus, and the IUD inserter will be removed. The strings that are attached to the IUD will be trimmed  so that they lie just below the cervix. The speculum will be removed. The procedure may vary among health care providers and hospitals. What can I expect after procedure? You may have bleeding after the procedure. This is normal. It varies from light bleeding (spotting) for a few days to menstrual-like bleeding. You may have cramping and pain in the abdomen. You may feel dizzy or light-headed. You may have lower back pain. You may have headaches and nausea. Follow these instructions at home: Before resuming sexual activity, check to make sure that you can feel the IUD string or strings. You should be able to feel the end of the string below the opening of your cervix. If your IUD string is in place, you may resume sexual activity. If you had a hormonal IUD inserted more than 7 days after your most recent period started, you will need to use a backup method of birth control for 7 days after IUD insertion. Ask your health care provider whether this applies to you. Continue to check that the IUD is still in place by feeling for the strings after every menstrual period, or once a month. An IUD will not protect you from sexually transmitted infections (STIs). Use methods to prevent the exchange of body fluids between partners (barrier protection) every time you have sex. Barrier protection can be used during oral, vaginal, or anal sex. Commonly used barrier methods include: Female condom. Female condom. Dental dam. Take over-the-counter and prescription medicines only as told by your health care provider. Keep all follow-up visits. This is important. Contact a health care provider if: You feel light-headed or weak. You have any of the following problems with your IUD string or strings: The string bothers or hurts you or your sexual partner. You cannot feel the string. The string has gotten longer. You can feel the IUD in your vagina. You think you may be pregnant, or you miss your menstrual  period. You think you may have a sexually transmitted infection (STI). Get help right away if you: You have flu-like symptoms, such as tiredness (fatigue) and muscle aches. You have a fever and chills. You have bleeding that is heavier or lasts longer than a normal menstrual cycle. You have abnormal or bad-smelling discharge from your vagina. You develop abdominal pain that is new, is getting worse, or is not in the same area of earlier cramping and pain. You have pain during sexual activity. Summary An intrauterine device (IUD) is a small, T-shaped device that has one or two nylon strings hanging down from it. You may have a copper IUD or a hormone IUD. Ask your health care provider what you need to do before the procedure. You may have some tests and you may have to change or stop some medicines. You may have bleeding after the procedure. This is normal. It varies from light spotting for a few days to menstrual-like bleeding. Check to make sure that you can feel the IUD strings before you resume sexual activity. Check the strings after every menstrual period or once a month. An IUD does not protect against STIs. Use other methods to protect yourself against infections. This information is not intended to replace advice given to you by your health care provider. Make sure you discuss any questions you have with  your health care provider. Document Revised: 04/30/2020 Document Reviewed: 04/30/2020 Elsevier Patient Education  2024 [employer].

## 2023-10-11 NOTE — Telephone Encounter (Signed)
Per review of EPIC, patient is scheduled for PUS on 10/12/23 at 1600.   Routing FYI.   Encounter closed.

## 2023-10-12 ENCOUNTER — Ambulatory Visit (HOSPITAL_COMMUNITY)
Admission: RE | Admit: 2023-10-12 | Discharge: 2023-10-12 | Disposition: A | Discharge status: Home / Self Care | Payer: 59 | Source: Ambulatory Visit | Attending: Obstetrics and Gynecology | Admitting: Obstetrics and Gynecology

## 2023-10-12 DIAGNOSIS — Z30431 Encounter for routine checking of intrauterine contraceptive device: Secondary | ICD-10-CM | POA: Insufficient documentation

## 2023-10-13 ENCOUNTER — Encounter: Payer: Self-pay | Admitting: Obstetrics and Gynecology

## 2023-10-13 NOTE — Telephone Encounter (Signed)
Spoke w/ MG @ the radiology reception desk and she stated that she will get it expedited to be read ASAP.   I voiced our appreciation.

## 2023-10-14 NOTE — Telephone Encounter (Signed)
Final [place] report received and already reviewed responded to by BS.   Encounter closed and routed to [facility] for final review.

## 2023-10-15 ENCOUNTER — Other Ambulatory Visit: Payer: Self-pay | Admitting: Neurology

## 2023-10-17 ENCOUNTER — Encounter: Payer: Self-pay | Admitting: Family Medicine

## 2023-10-17 NOTE — Progress Notes (Unsigned)
Start time: End time:  Virtual Visit via Video Note  I connected with Lindsay Mccarthy on 10/17/23 by a video enabled telemedicine application and verified that I am speaking with the correct person using two identifiers.  Location: Patient: *** Provider: office   I discussed the limitations of evaluation and management by telemedicine and the availability of in person appointments. The patient expressed understanding and agreed to proceed.  History of Present Illness:  No chief complaint on file.   Per CPE in 12/2022: Depression follow-up: She had noted worsening moods,and seeing Welbutrin SR tablets in her ostomy bag.  We switched her from wellbutrin SR to plain wellbutrin 100mg  BID.  She noted improvement in moods with this change. She continues to take Lexapro 10mg  daily. She denies side effects.   Virtual visits thru [facility] for weight loss--on naltrexone (started in 03/2023 on 25mg  daily ***) and 10/07/2023 they added topamax.  She had IUD removed yesterday (was too low), planning for Nexplanon placement for contraception through GYN.  Vitamin D deficiency:  last level was slightly low at 28.3 in 10/2022.   She is currently taking 10,000 IU BID. Level was due to be rechecked in October.     Observations/Objective:  There were no vitals taken for this visit.   Assessment and Plan:  Unsure what is going on. May need PHQ9, GAD-7 if having anxiety  Update meds--?taking naltrexone 25mg  daily or injection (both in med list).  I see future order for vitamin D level that was due 08/2023 from Dr. Everlena Cooper, never done.  On high dose.  Last checked 10/2022 and was slightly low at 28.3)    Follow Up Instructions:    I discussed the assessment and treatment plan with the patient. The patient was provided an opportunity to ask questions and all were answered. The patient agreed with the plan and demonstrated an understanding of the instructions.   The patient was advised to call  back or seek an in-person evaluation if the symptoms worsen or if the condition fails to improve as anticipated.  I spent *** minutes dedicated to the care of this patient, including pre-visit review of records, face to face time, post-visit ordering of testing and documentation.    Lavonda Jumbo, MD

## 2023-10-18 ENCOUNTER — Ambulatory Visit (INDEPENDENT_AMBULATORY_CARE_PROVIDER_SITE_OTHER): Payer: 59 | Admitting: Radiology

## 2023-10-18 ENCOUNTER — Encounter: Payer: Self-pay | Admitting: Radiology

## 2023-10-18 VITALS — BP 116/72 | HR 82

## 2023-10-18 DIAGNOSIS — Z3009 Encounter for other general counseling and advice on contraception: Secondary | ICD-10-CM

## 2023-10-18 DIAGNOSIS — Z30432 Encounter for removal of intrauterine contraceptive device: Secondary | ICD-10-CM

## 2023-10-18 DIAGNOSIS — Z30017 Encounter for initial prescription of implantable subdermal contraceptive: Secondary | ICD-10-CM

## 2023-10-18 NOTE — Progress Notes (Signed)
   Lindsay Mccarthy [birth year] [id number]   History:  47 y.o. G0 presents for removal of Mirena IUD.  Reason for discontinuation: in the lower uterine segment per u/s. Pt has been counseled about risks and benefits as well as complications.  Consent is obtained today.  No LMP recorded. (Menstrual status: IUD).  Narrative & Impression  CLINICAL DATA:  IUD check   EXAM: TRANSABDOMINAL AND TRANSVAGINAL ULTRASOUND OF PELVIS   TECHNIQUE: Both transabdominal and transvaginal ultrasound examinations of the pelvis were performed. Transabdominal technique was performed for global imaging of the pelvis including uterus, ovaries, adnexal regions, and pelvic cul-de-sac. It was necessary to proceed with endovaginal exam following the transabdominal exam to visualize the endometrium and bilateral adnexa.   COMPARISON:  CT abdomen/pelvis dated 07/14/2023   FINDINGS: Uterus   Measurements: 6.0 x 2.5 x 4.0 cm = volume: 31 mL. No fibroids or other mass visualized.   Endometrium   Thickness: 3 mm. Low-lying IUD in the lower uterine segment, displaced from prior CT.   Right ovary   Not discretely visualized.  No adnexal mass is seen.   Left ovary   Not discretely visualized. No adnexal mass is seen.   Other findings   No abnormal free fluid.   IMPRESSION: Low-lying IUD in the lower uterine segment, displaced from prior CT.   Nonvisualization of the ovaries.     Electronically Signed   By: Charline Bills M.D.   On: 10/13/2023 18:33      Past medical history, past surgical history, family history and social history were all reviewed and documented in the EPIC chart.  ROS:  A ROS was performed and pertinent positives and negatives are included.  Exam: Vitals:   10/18/23 1358  BP: 116/72  Pulse: 82  SpO2: 97%   There is no height or weight on file to calculate BMI.  Pelvic exam: Vulva:  normal female genitalia Vagina:  normal vagina, no discharge, exudate, lesion,  or erythema Cervix:  Non-tender, Negative CMT, no lesions or redness. IUD strings visualized. Uterus:  normal shape, position and consistency    Procedure:  Speculum inserted.   Cervix visualized, IUD grasps with forceps and removed without difficulty. Pt tolerated procedure well.   Chaperone, Raynelle Fanning, CMA present for exam   Assessment/Plan: 1. Encounter for initial prescription of Nexplanon (Primary) Counseled on other non estrogen methods Will schedule insertion of nexplanon No unprotected sex before insertion - Insertion of implanon rod; Future  2. Encounter for IUD removal Removed without difficulty    Pt aware to call for any concerns, return to office as schedule for annual exam or as needed.   Makale Pindell B WHNP-BC, 2:10 PM 10/18/2023

## 2023-10-19 ENCOUNTER — Encounter: Payer: Self-pay | Admitting: Family Medicine

## 2023-10-19 ENCOUNTER — Telehealth: Payer: 59 | Admitting: Family Medicine

## 2023-10-19 VITALS — BP 121/72 | HR 78 | Ht 64.5 in | Wt 208.0 lb

## 2023-10-19 DIAGNOSIS — Z6835 Body mass index (BMI) 35.0-35.9, adult: Secondary | ICD-10-CM

## 2023-10-19 DIAGNOSIS — Z309 Encounter for contraceptive management, unspecified: Secondary | ICD-10-CM | POA: Diagnosis not present

## 2023-10-19 DIAGNOSIS — F324 Major depressive disorder, single episode, in partial remission: Secondary | ICD-10-CM | POA: Diagnosis not present

## 2023-10-19 NOTE — Patient Instructions (Signed)
Try switching your wellbutrin to first thing in the morning and early afternoon (no later than 5-6 pm).  The short-acting wellbutrin is intended to be taken 3 times daily--it might be wearing off in the afternoon, causing worse moods. When taken later in the evening, it can contribute to worsening insomnia. I suggest taking it in the morning, and maybe at 2-3 pm. If not noticing improvement in moods, can increase to three times daily (trying to separate doses by 6 hours, but avoiding late evening doses). Continue your Lexapro.  Vitamin D needs to be checked once a year--either through Dr. Everlena Cooper (you have an order from him that you can do at any time), or we can check at your physical. I feel better knowing that you are taking 10,000 IU daily, and NOT twice daily.

## 2023-10-27 ENCOUNTER — Ambulatory Visit: Payer: 59 | Admitting: Radiology

## 2023-10-27 NOTE — Telephone Encounter (Signed)
Pharmacy Patient Advocate Encounter  Received notification from [facility] that Prior Authorization for Lindsay Mccarthy has been APPROVED from 08/03/2023 to 08/01/2024

## 2023-11-10 ENCOUNTER — Other Ambulatory Visit (HOSPITAL_BASED_OUTPATIENT_CLINIC_OR_DEPARTMENT_OTHER): Payer: Self-pay | Admitting: Family Medicine

## 2023-11-10 DIAGNOSIS — Z1231 Encounter for screening mammogram for malignant neoplasm of breast: Secondary | ICD-10-CM

## 2023-11-14 ENCOUNTER — Encounter: Payer: Self-pay | Admitting: Family Medicine

## 2023-11-14 DIAGNOSIS — D509 Iron deficiency anemia, unspecified: Secondary | ICD-10-CM

## 2023-11-14 NOTE — Telephone Encounter (Signed)
 I accidentally sent this message to the patient--when it was supposed to go to you.  She is still under the care of Dr. Onesimo, who manages her iron  deficiency.  But I prescribe her eliquis . We will need to contact his office to see if it is okay for her to stop it (he was consulted for this when it was started)--not sure what procedure she is having, so we will have to wait for the paperwork.  ?our office said she isn't a patient here?? Look out for the paperwork please.

## 2023-11-15 ENCOUNTER — Ambulatory Visit (HOSPITAL_BASED_OUTPATIENT_CLINIC_OR_DEPARTMENT_OTHER)
Admission: RE | Admit: 2023-11-15 | Discharge: 2023-11-15 | Disposition: A | Discharge status: Home / Self Care | Payer: 59 | Source: Ambulatory Visit | Attending: Family Medicine | Admitting: Family Medicine

## 2023-11-15 ENCOUNTER — Encounter (HOSPITAL_BASED_OUTPATIENT_CLINIC_OR_DEPARTMENT_OTHER): Payer: Self-pay | Admitting: Radiology

## 2023-11-15 ENCOUNTER — Telehealth: Payer: Self-pay | Admitting: *Deleted

## 2023-11-15 DIAGNOSIS — Z1231 Encounter for screening mammogram for malignant neoplasm of breast: Secondary | ICD-10-CM | POA: Diagnosis present

## 2023-11-15 NOTE — Telephone Encounter (Signed)
 He has been following her for an unrelated issue, her iron  deficiency anemia.  Not sure that a visit is needed to answer a question that I have.  He was the one who initially consulted on her DVT/PE back in 03/2018.  I reviewed the original consult note, and in it he stated-- if she needs a surgery she would need a transition plan before and after surgery or invasive procedure.  Therefore, I don't really need his input--I have been leaning towards this, and reading it again reinforces my recommendation.  Please see if we can set her up with Jon (pharmacist) to discuss/plan for bridging with lovenox. (Will need an order for ccm, and can send note to her)

## 2023-11-15 NOTE — Telephone Encounter (Signed)
 Message left on Dr. Clyda Greener nurses voicemail. I have the clearance request for the C4-5 cervical disc arthoplasty on my desk.

## 2023-11-15 NOTE — Addendum Note (Signed)
 Addended by: Loretha Stapler on: 11/15/2023 01:40 PM   Modules accepted: Orders

## 2023-11-15 NOTE — Telephone Encounter (Signed)
 Lindsay Mccarthy, Dr. Maryla nurse called me back and she spoke with Dr. Onesimo. He said he is very limited in what advice he can give as patient has not been seen there since April 2024. She said he is not willing to make any recommendations at this time. He doesn't want to try and guess. She would need to be seen in person. I asked of they could see her anytime soon and was told there were not any openings. Lindsay Mccarthy his nurse did say in her opinion she did feel like patient would need bridging. Not sure if sending a message directly would help.

## 2023-11-16 ENCOUNTER — Other Ambulatory Visit: Payer: Self-pay | Admitting: Family Medicine

## 2023-11-16 ENCOUNTER — Other Ambulatory Visit: Payer: Self-pay | Admitting: *Deleted

## 2023-11-16 ENCOUNTER — Telehealth: Payer: Self-pay

## 2023-11-16 DIAGNOSIS — Z5181 Encounter for therapeutic drug level monitoring: Secondary | ICD-10-CM

## 2023-11-16 DIAGNOSIS — R928 Other abnormal and inconclusive findings on diagnostic imaging of breast: Secondary | ICD-10-CM

## 2023-11-16 NOTE — Telephone Encounter (Signed)
 Referral sent

## 2023-11-16 NOTE — Progress Notes (Signed)
 Care Guide Pharmacy Note  11/16/2023 Name: Lindsay Mccarthy MRN: [id number] DOB: September 02, 1976  Referred By: Roosvelt Colla, MD Reason for referral: Care Coordination (Outreach to schedule with Pharm d )   [name] is a 48 y.o. year old female who is a primary care patient of Roosvelt Colla, MD.  [name] was referred to the pharmacist for assistance related to:  DVT  Successful contact was made with the patient to discuss pharmacy services including being ready for the pharmacist to call at least 5 minutes before the scheduled appointment time and to have medication bottles and any blood pressure readings ready for review. The patient agreed to meet with the pharmacist via telephone visit on (date/time).11/22/2023  Lenton Rail , RMA     [place]  [facility], [facility] Guide  Direct Dial: [phone]  Website: [email or web address]

## 2023-11-17 ENCOUNTER — Ambulatory Visit: Payer: 59 | Admitting: Radiology

## 2023-11-17 ENCOUNTER — Encounter: Payer: Self-pay | Admitting: Family Medicine

## 2023-11-18 ENCOUNTER — Telehealth: Payer: Self-pay

## 2023-11-18 ENCOUNTER — Other Ambulatory Visit (INDEPENDENT_AMBULATORY_CARE_PROVIDER_SITE_OTHER): Payer: 59

## 2023-11-18 DIAGNOSIS — D509 Iron deficiency anemia, unspecified: Secondary | ICD-10-CM

## 2023-11-18 LAB — IBC + FERRITIN
Ferritin: 99.6 ng/mL (ref 10.0–291.0)
Iron: 109 ug/dL (ref 42–145)
Saturation Ratios: 17.3 % — ABNORMAL LOW (ref 20.0–50.0)
TIBC: 631.4 ug/dL — ABNORMAL HIGH (ref 250.0–450.0)
Transferrin: 451 mg/dL — ABNORMAL HIGH (ref 212.0–360.0)

## 2023-11-18 NOTE — Telephone Encounter (Signed)
Received fax from [facility] stating that they have made several attempts to contact the pt to fill her Sterala: Pt was contacted in regard to the Fax and stated that she called them today and spoke with them and that they are to ship medication out tomorrow. Pt verbalized understanding with all questions answered.

## 2023-11-21 ENCOUNTER — Other Ambulatory Visit: Payer: Self-pay | Admitting: Family Medicine

## 2023-11-21 DIAGNOSIS — E781 Pure hyperglyceridemia: Secondary | ICD-10-CM

## 2023-11-22 ENCOUNTER — Other Ambulatory Visit: Payer: 59

## 2023-11-22 NOTE — Progress Notes (Unsigned)
   11/22/2023  Patient ID: Lindsay Mccarthy, female   DOB: 05/02/1976, 48 y.o.   MRN: [id number]  Reached out to patient via telephone at request of PCP to discuss the use of her Eliquis and timing of anticoagulation around her procedure scheduled for 12/12/23.  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 Patient reports she also had a DVT prior to this event that did not require hospitalization. 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   The procedure that patient is having performed is a C4-C5 arthroplasty. This is considered a high-bleed risk spinal procedure. Per guidelines and [facility] Periprocedural Anticoagulation Protocol, patient should hold her Eliquis for 72 hours prior to the procedure. No bridging required.  Post-procedure, typically recommend restarting Eliquis in 48-72 hours. However, given patient's history of VTE, would want to restart as soon as considered safe. If patient has no complications post-procedure, could consider restarting Eliquis in 24 hours with no need for bridging.  Routing to PCP for consideration.  Sherrill Raring, PharmD Clinical Pharmacist [phone]

## 2023-11-24 ENCOUNTER — Ambulatory Visit: Payer: 59

## 2023-11-24 ENCOUNTER — Telehealth: Payer: Self-pay

## 2023-11-24 ENCOUNTER — Ambulatory Visit
Admission: RE | Admit: 2023-11-24 | Discharge: 2023-11-24 | Disposition: A | Discharge status: Home / Self Care | Payer: 59 | Source: Ambulatory Visit | Attending: Family Medicine | Admitting: Family Medicine

## 2023-11-24 DIAGNOSIS — R928 Other abnormal and inconclusive findings on diagnostic imaging of breast: Secondary | ICD-10-CM

## 2023-11-24 NOTE — Telephone Encounter (Signed)
Please order to have these done through [place], and verify that she can bring her service dog. Thanks

## 2023-11-24 NOTE — Telephone Encounter (Signed)
Form for surgical clearance for neck surgery completed and faxed to [facility] at fax # [phone]. Received confirmation that it went thru. Will have the form scanned into epic.

## 2023-11-28 ENCOUNTER — Ambulatory Visit: Payer: 59 | Admitting: Obstetrics and Gynecology

## 2023-11-29 MED ORDER — DIPHENOXYLATE-ATROPINE 2.5-0.025 MG PO TABS: ORAL_TABLET | ORAL | 1 refills | Status: AC

## 2023-11-29 NOTE — Addendum Note (Signed)
Addended by: Richardson Chiquito on: 11/29/2023 11:11 AM   Modules accepted: Orders

## 2023-12-02 ENCOUNTER — Other Ambulatory Visit: Payer: 59

## 2023-12-06 ENCOUNTER — Encounter: Payer: Self-pay | Admitting: Family Medicine

## 2023-12-12 HISTORY — PX: CERVICAL DISC ARTHROPLASTY: SHX587

## 2023-12-15 ENCOUNTER — Other Ambulatory Visit: Payer: Self-pay | Admitting: Family Medicine

## 2023-12-15 DIAGNOSIS — E781 Pure hyperglyceridemia: Secondary | ICD-10-CM

## 2023-12-15 DIAGNOSIS — F325 Major depressive disorder, single episode, in full remission: Secondary | ICD-10-CM

## 2023-12-27 LAB — HM MAMMOGRAPHY

## 2024-01-04 ENCOUNTER — Other Ambulatory Visit: Payer: Self-pay | Admitting: Family Medicine

## 2024-01-04 DIAGNOSIS — Z86711 Personal history of pulmonary embolism: Secondary | ICD-10-CM

## 2024-01-04 DIAGNOSIS — Z7901 Long term (current) use of anticoagulants: Secondary | ICD-10-CM

## 2024-01-17 ENCOUNTER — Other Ambulatory Visit: Payer: Self-pay | Admitting: Family Medicine

## 2024-01-17 DIAGNOSIS — F325 Major depressive disorder, single episode, in full remission: Secondary | ICD-10-CM

## 2024-01-23 ENCOUNTER — Encounter: Payer: Self-pay | Admitting: Family Medicine

## 2024-01-23 DIAGNOSIS — E538 Deficiency of other specified B group vitamins: Secondary | ICD-10-CM

## 2024-01-23 DIAGNOSIS — Z0000 Encounter for general adult medical examination without abnormal findings: Secondary | ICD-10-CM

## 2024-01-23 DIAGNOSIS — E559 Vitamin D deficiency, unspecified: Secondary | ICD-10-CM

## 2024-01-23 DIAGNOSIS — Z5181 Encounter for therapeutic drug level monitoring: Secondary | ICD-10-CM

## 2024-01-23 DIAGNOSIS — D500 Iron deficiency anemia secondary to blood loss (chronic): Secondary | ICD-10-CM

## 2024-01-23 DIAGNOSIS — E781 Pure hyperglyceridemia: Secondary | ICD-10-CM

## 2024-01-23 DIAGNOSIS — Z7901 Long term (current) use of anticoagulants: Secondary | ICD-10-CM

## 2024-01-26 ENCOUNTER — Other Ambulatory Visit

## 2024-01-26 DIAGNOSIS — D500 Iron deficiency anemia secondary to blood loss (chronic): Secondary | ICD-10-CM

## 2024-01-26 DIAGNOSIS — Z5181 Encounter for therapeutic drug level monitoring: Secondary | ICD-10-CM

## 2024-01-26 DIAGNOSIS — Z7901 Long term (current) use of anticoagulants: Secondary | ICD-10-CM

## 2024-01-26 DIAGNOSIS — E559 Vitamin D deficiency, unspecified: Secondary | ICD-10-CM

## 2024-01-26 DIAGNOSIS — E538 Deficiency of other specified B group vitamins: Secondary | ICD-10-CM

## 2024-01-26 DIAGNOSIS — Z0000 Encounter for general adult medical examination without abnormal findings: Secondary | ICD-10-CM

## 2024-01-26 DIAGNOSIS — E781 Pure hyperglyceridemia: Secondary | ICD-10-CM

## 2024-01-26 LAB — LIPID PANEL

## 2024-01-27 LAB — CBC WITH DIFFERENTIAL/PLATELET
Basophils Absolute: 0 10*3/uL (ref 0.0–0.2)
Basos: 0 %
EOS (ABSOLUTE): 0.2 10*3/uL (ref 0.0–0.4)
Eos: 2 %
Hematocrit: 39.5 % (ref 34.0–46.6)
Hemoglobin: 12.7 g/dL (ref 11.1–15.9)
Immature Grans (Abs): 0 10*3/uL (ref 0.0–0.1)
Immature Granulocytes: 0 %
Lymphocytes Absolute: 1.2 10*3/uL (ref 0.7–3.1)
Lymphs: 17 %
MCH: 30 pg (ref 26.6–33.0)
MCHC: 32.2 g/dL (ref 31.5–35.7)
MCV: 93 fL (ref 79–97)
Monocytes Absolute: 0.8 10*3/uL (ref 0.1–0.9)
Monocytes: 11 %
Neutrophils Absolute: 5.2 10*3/uL (ref 1.4–7.0)
Neutrophils: 70 %
Platelets: 418 10*3/uL (ref 150–450)
RBC: 4.23 x10E6/uL (ref 3.77–5.28)
RDW: 12.5 % (ref 11.7–15.4)
WBC: 7.4 10*3/uL (ref 3.4–10.8)

## 2024-01-27 LAB — COMPREHENSIVE METABOLIC PANEL WITH GFR
ALT: 9 IU/L (ref 0–32)
AST: 17 IU/L (ref 0–40)
Albumin: 4.2 g/dL (ref 3.9–4.9)
Alkaline Phosphatase: 61 IU/L (ref 44–121)
BUN/Creatinine Ratio: 13 (ref 9–23)
BUN: 13 mg/dL (ref 6–24)
CO2: 22 mmol/L (ref 20–29)
Calcium: 9.2 mg/dL (ref 8.7–10.2)
Chloride: 105 mmol/L (ref 96–106)
Creatinine, Ser: 0.99 mg/dL (ref 0.57–1.00)
Globulin, Total: 2.5 g/dL (ref 1.5–4.5)
Glucose: 80 mg/dL (ref 70–99)
Potassium: 4.4 mmol/L (ref 3.5–5.2)
Sodium: 142 mmol/L (ref 134–144)
Total Protein: 6.7 g/dL (ref 6.0–8.5)
eGFR: 71 mL/min/{1.73_m2} (ref >59)

## 2024-01-27 LAB — LIPID PANEL
Cholesterol, Total: 132 mg/dL (ref 100–199)
HDL: 78 mg/dL (ref >39)
LDL CALC COMMENT:: 1.7 ratio (ref 0.0–4.4)
LDL Chol Calc (NIH): 33 mg/dL (ref 0–99)
Triglycerides: 126 mg/dL (ref 0–149)
VLDL Cholesterol Cal: 21 mg/dL (ref 5–40)

## 2024-01-27 LAB — IRON,TIBC AND FERRITIN PANEL
Ferritin: 52 ng/mL (ref 15–150)
Iron Saturation: 10 % — ABNORMAL LOW (ref 15–55)
Iron: 55 ug/dL (ref 27–159)
Total Iron Binding Capacity: 572 ug/dL (ref 250–450)
UIBC: 517 ug/dL — ABNORMAL HIGH (ref 131–425)

## 2024-01-27 LAB — VITAMIN B12: Vitamin B-12: 618 pg/mL (ref 232–1245)

## 2024-01-27 LAB — VITAMIN D 25 HYDROXY (VIT D DEFICIENCY, FRACTURES): Vit D, 25-Hydroxy: 47.7 ng/mL (ref 30.0–100.0)

## 2024-01-31 NOTE — Progress Notes (Signed)
 Chief Complaint  Patient presents with   Annual Exam    Nonfasting annual exam, did not get flu or covid and does want either today. Has not had Nexplanon, could SaraBeth do or does she need to go to GYN? Has not discussed DEXA with GYN. Has no concerns today.    Lindsay Mccarthy is a 48 y.o. female who presents for a complete physical.  See below for labs done prior to visit.  Had neck surgery 12/12/23 at [facility] (C4-C5 cervical disc arthoplasty). She has been out of work, returns soon.  She has done very well. Has been getting PT.  Depression follow-up: Patient had been doing well with her moods on Lexapro 10mg  daily, and plain wellbutrin 100mg  BID for quite a while. She has noted worsening of moods in November/December related to the weather, darkness, time of year. She had stopped smoking marijuana in order to help with weight loss (was binging more after smoking), and she wasn't sleeping well at all. She had been taking Wellbutrin in the morning, and at bedtime.  At her last visit she had resumed smoking marijuana (and going straight to bed, no snacking), and she had noticed some improvement in moods after getting better sleep. We discussed poor sleep was likely contributing to moods, and that the timing of the wellbutrin may be a factor.  She was advised to take wellbutrin first thing in the morning, and early afternoon (no later than 5-6pm, ideally around 2-3pm).  If moods weren't improving, discussed increasing to TID, trying to separate doses by 6 hours, but avoiding late evening doses. Today she reports doing very well. She takes wellbutrin later in the morning (since not working since surgery), and is taking the 2nd dose a little earlier (7pm). Moods are better. She is sleeping well.  Still smoking marijuana nightly, and doing well with not snacking after.   She has been doing virtual visits thru [facility] for weight loss--they started her on naltrexone in 03/2023 (25 mg dose, further titrated  up to 50mg ) which she previously reported helped her not physically feel hungry.   She got down to 192# prior to her neck surgery. She was off Naltrexone for about 3 weeks related to surgery/opioids. She gained weight postoperatively. Last week they switched her from naltrexone, and started her on Zepbound 2.5 mg. She has taken it once so far, and thinks it might already be helping.  She notices a much slower transit, for food to reach her ostomy bag, and is much thicker. She no longer sees Ampyra in the bag (takes after breakfast and HS). Only had nausea (mild) 1 day. This is not covered by insurance, will cost $350-500/month, but is affordable for her (they had discussed committing to taking for a year).   She had IUD removed in 10/2023 (was too low), planning for Nexplanon placement for contraception through GYN.  She hasn't scheduled this yet. She has concerns about it, given her prior clots.   She cannot use estrogen (h/o PE).  She has had Mirena IUD's for a long time, hadn't had a period in 10 years, and preferred not to start again. She states she was told that her uterus got smaller. She declined option of a smaller IUD, inserted under [place]. She prefers not to deal with the hassle of that.   Currently is using condoms. Her husband is not willing to consider vasectomy. Periods started about a month after IUD removal, occurring about every 20 days.  Last about a  week, not heavy.   Vitamin D deficiency:  She is complaint with taking 10,000 IU daily (though switched to taking 5000 IU BID), and level was normal on this dose.   Component Ref Range & Units (hover) 5 d ago (01/26/24) 1 yr ago (10/04/22) 3 yr ago (06/30/20) 4 yr ago (12/18/19) 4 yr ago (07/18/19) 5 yr ago (09/12/18) 6 yr ago (12/13/17)  Vit D, 25-Hydroxy 47.7 28.3 Low  CM 46.2 CM 29.4 Low  CM 31.8 CM 21 Low  R, CM 32.0 CM   Hypertriglyceridemia:  Rosuvastatin 5 mg was added to her regimen of fenofibrate and fish oil last year.  Lipids  improved.  (Vascepa was denied because she wasn't on a statin; we kept fish oils at lower dose due to potential bleeding risks, she is anticoagulated due to h/o PE/DVT).  TG levels got much higher after her surgery (ostomy), and use of liquid ferrous sulfate 220mg /16ml. TG were significantly improved after stopping the liquid iron, and normal on recent check.  Component Ref Range & Units (hover) 5 d ago (01/26/24) 10 mo ago (03/09/23) 1 yr ago (11/22/22) 1 yr ago (10/04/22) 1 yr ago (08/24/22) 1 yr ago (07/15/22) 2 yr ago (01/06/22)  Cholesterol, Total 132 124 172 208 High  166 171 199  Triglycerides 126 162 High  376 High  876 High Panic  481 High  378 High  251 High   HDL 78 65 66 47 52 55 82  VLDL Cholesterol Cal 21 26 56 High  Comment Abnormal  CM 69 High  57 High  40  LDL Chol Calc (NIH) 33 33 50 Comment Abnormal  CM 45 59 77  Chol/HDL Ratio 1.7 1.9 CM 2.6 CM 4.4 CM 3.2 CM 3.1 CM 2.4 CM   She continues to eat a healthier diet Now eating yogurt for breakfast, usually plain with sugar-free pudding mix (powder) sprinkled on top for flavor; sometimes with 2 Tbsp chia seeds. When working, lunches had been mainly beans in place of meat, sweet potatoes (canned, in splenda, rinses them).  Going back to work next week, and will resume this plan (works well for her schedule). Dinners--Every [employer] meal subscription, limits portions.  These have some butter (cuts the portion in 1/2), oils, mayo.  These are baked.  Mainly doing the meals with meat and vegetables. She switched to [facility] [employer] meals for a month after surgery, but is back to [employer] now. She is trying hard not to eat after dinner. "I am a week sober from candy"--so far so good.  Current exercise--"not much", especially since neck surgery. She started PT last week, plans 2x/week x 5 weeks. No longer uses walker, only rarely needs to use her cane. Using it today because her service dog isn't with her today (injured foot) Plans to  resume walking.   H/o elevated LFT's:  These have remained WNL since 07/2022. [place] in 07/2022: IMPRESSION: Liver may be slightly echogenic as may be seen with steatosis. Otherwise negative examination  She has h/o DVT and PE, 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 She denies bleeding or bruising. Denies shortness of breath, chest pain or leg swelling.   Hemoglobin has remained good; elevated TIBC, but iron, ferritin normal. See below. Denies feeling any increase in fatigue. She isn't taking any kind of iron. Having periods again (per above) since IUD removed.  B12 deficiency--compliant with injections every 2 weeks.   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   Pathology was benign. She had ileoscopy in 06/2022, biopsy showed inflammation.  Recheck in 5 years recommended.  She prefers to keep the ostomy rather than take it down. She remains under the care of Dr. Leone Payor for her Crohn's.  She has been doing well on Stelara.  MS--Under the care of Dr. Everlena Cooper. She continues to take Ampyra, gabapentin, and baclofen.  She continues to do well. She really feels the Stelara helps with her walking (had to stop it for 6 weeks after neck surgery; walking got worse, improved after resuming it). She no longer gets electric shocks in her legs (stopped after neck surgery). Still has balance issues, foot drop, no new issues. She had reported seeing the Ampyra passing through (sees it in her bag), this improved after recently starting the Zepbound.   Her bladder issues flare sometimes, only related to stress. She takes gabapentin daily, cut back from 300 mg to 200 mg after her neck surgery. She plans to d/w Dr. Everlena Cooper potential to taper further.  History of abnormal paps, HPV. Treated with LEEP in 2011. She is under the care of Dr. Edward Jolly for her GYN care. Last pap 06/2022 was normal, no high risk HPV.  Osteopenia:  DEXA  09/2013 showed T -1.6.  She had repeat study 10/2016, and had improved some.  She has since had Vitamin D deficiency diagnosed/treated.    She has yogurt and cottage cheese daily. Occasional almond milk.    Immunization History  Administered Date(s) Administered   Hepatitis A 06/29/2012, 01/04/2013   Influenza Split 08/09/2012   Influenza,inj,Quad PF,6+ Mos 09/18/2013, 10/07/2014, 12/15/2015, 08/02/2016, 10/05/2017, 07/18/2019, 07/30/2022   PFIZER(Purple Top)SARS-COV-2 Vaccination 05/24/2020, 06/14/2020, 11/14/2020   PPD Test 04/30/2013, 10/07/2014   Pfizer(Comirnaty)Fall Seasonal Vaccine 12 years and older 08/24/2022   Pneumococcal Conjugate-13 04/12/2018   Pneumococcal Polysaccharide-23 07/06/2012, 07/30/2022   Tdap 12/15/2015   Last Pap smear: 06/2022, normal with no high risk HPV Last mammogram: 12/2023 Last colonoscopy: 01/2021, along with EGD (Dr. Leone Payor) Last DEXA: 10/2016 (through [facility]) Ophtho: yearly Dentist: twice yearly  Exercise:  "not much", especially since neck surgery. She started PT last week, plans 2x/week x 5 weeks. Plans to resume walking  She sees dermatologist (Dr. Lovenia Kim) regularly (now q2 years)   PMH, PSH, SH and FH reviewed and updated  Outpatient Encounter Medications as of 02/01/2024  Medication Sig Note   baclofen (LIORESAL) 10 MG tablet TAKE 1 TABLET(10 MG) BY MOUTH THREE TIMES DAILY AS NEEDED FOR MUSCLE SPASMS 10/19/2023: 20mg  qhs   buPROPion (WELLBUTRIN) 100 MG tablet TAKE 1 TABLET(100 MG) BY MOUTH TWICE DAILY    cetirizine (ZYRTEC) 10 MG tablet Take 10 mg by mouth at bedtime.     cyanocobalamin (DODEX) 1000 MCG/ML injection INJECT 1 ML INTO THE SKIN EVERY 14 DAYS    dalfampridine 10 MG TB12 TAKE 1 TABLET BY MOUTH EVERY 12  HOURS    ELIQUIS 5 MG TABS tablet TAKE 1 TABLET BY MOUTH TWICE  DAILY    escitalopram (LEXAPRO) 10 MG tablet TAKE 1 TABLET BY MOUTH AT  BEDTIME    fenofibrate 160 MG tablet TAKE 1 TABLET BY MOUTH AT  BEDTIME     gabapentin (NEURONTIN) 100 MG capsule TAKE 3 CAPSULES BY MOUTH AT  BEDTIME (Patient taking differently: 200 mg at bedtime. TAKE 3 CAPSULES BY MOUTH AT  BEDTIME)    Insulin Pen Needle 33G X 5 MM MISC 1 each by Does not apply route every 14 (fourteen)  days.    NON FORMULARY Take 1 each by mouth at bedtime. CBD/ cannabis 05/19/2022: prn   rosuvastatin (CRESTOR) 5 MG tablet TAKE 1 TABLET(5 MG) BY MOUTH DAILY    STELARA 90 MG/ML SOSY injection INJECT 1 SYRINGE SUBCUTANEOUSLY  EVERY 8 WEEKS    tirzepatide (ZEPBOUND) 2.5 MG/0.5ML injection vial Inject 2.5 mg into the skin once a week. 02/01/2024: Just started last Sat   VITAMIN D PO Take 5,000 Int'l Units by mouth in the morning and at bedtime.    acetaminophen (TYLENOL) 500 MG tablet Take 1,000 mg by mouth every 6 (six) hours as needed for headache (pain). Reported on 12/15/2015 (Patient not taking: Reported on 02/01/2024) 02/01/2024: As needed   diphenoxylate-atropine (LOMOTIL) 2.5-0.025 MG tablet Take 1 to 2 tablets by mouth every 6 to 8 hours as needed for diarrhea (Patient not taking: Reported on 02/01/2024) 02/01/2024: As needed   [DISCONTINUED] Ferrous Sulfate (IRON PO) Take 13.5 mg by mouth daily. (Patient not taking: Reported on 02/01/2024) 10/19/2023: 3 gummies=13.5mg    [DISCONTINUED] naltrexone (DEPADE) 50 MG tablet Take 50 mg by mouth daily.    [DISCONTINUED] Omega-3 Fatty Acids (FISH OIL) 1000 MG CAPS Take 2 each by mouth daily.    No facility-administered encounter medications on file as of 02/01/2024.   Allergies  Allergen Reactions   Iron Other (See Comments)    Blood in stool/Rectal Bleeding   Iron Sucrose Other (See Comments)    Fever, infusion reaction  Flu like symptoms   Ibuprofen Other (See Comments)    Avoids due to Crohns disease   Morphine And Codeine Itching    itchy   Ofatumumab Other (See Comments)    Crohn's flare   Topamax [Topiramate] Other (See Comments)    Headache (only took x 3d)   Adhesive [Tape] Rash    Rash with  electrodes   Prednisone Other (See Comments) and Anxiety    "crazy", hallucinations Tolerates methylprednisolone hallucinations     ROS:  The patient denies anorexia, fever, headaches,  vision changes, decreased hearing, ear pain, sore throat, breast concerns, chest pain, palpitations, dizziness, syncope, cough, nausea, vomiting, indigestion/heartburn, hematuria, dysuria, vaginal bleeding, discharge, odor or itch, genital lesions, tremor, suspicious skin lesions, abnormal or enlarged lymph nodes.  Chronic allergies, controlled by OTC meds.  Gait instability and incontinence both much improved on current treatments. Depression is improved, per HPI. +bruising, denies bleeding. No further paresthesias in fingers, or electric shocks in the legs. Denies abdominal pain or blood/mucus in ostomy bag. No longer getting headaches  R ankle pain on/off for a few years, a little worse recently. Occ knee pain when rainy/cold. Menses q20 days since IUD removed.     PHYSICAL EXAM:  BP 120/62   Pulse 80   Ht 5' 4.5" (1.638 m)   Wt 206 lb (93.4 kg)   BMI 34.81 kg/m   Wt Readings from Last 3 Encounters:  02/01/24 206 lb (93.4 kg)  10/19/23 208 lb (94.3 kg)  10/10/23 203 lb (92.1 kg)   12/2022 weight 204 lb (92.5 kg) 12/2021 weight 209 lb (94.8 kg) 12/2020 weight was 224lb  General Appearance:     Alert, cooperative, no distress, appears stated age   Head:     Normocephalic, without obvious abnormality, atraumatic   Eyes:     PERRL, conjunctiva/corneas clear, EOM's intact, fundi benign   Ears:     Normal TM's and external ear canals   Nose:    No drainage or sinus tenderness  Throat:  Normal mucosa.   Neck:    Supple, no lymphadenopathy;  thyroid:  no enlargement/tenderness/ nodules; no carotid bruit or JVD. Healed surgical incision anterior neck  Back:     Spine nontender, no curvature, ROM normal, no CVA tenderness.    Lungs:      Clear to auscultation bilaterally without wheezes,  rales or ronchi; respirations unlabored   Chest Wall:     No tenderness or deformity    Heart:     Regular rate and rhythm, S1 and S2 normal, no murmur, rub or gallop   Breast Exam:     Deferred to GYN  Abdomen:      Soft, nondistended, normoactive bowel sounds, no masses, no hepatosplenomegaly. Ostomy is present.  Healthy tissue at ostomy site.    Genitalia:    deferred to GYN   Rectal:     deferred   Extremities:    No clubbing, cyanosis or edema.   Pulses:    2+ and symmetric all extremities   Skin:    Skin color, texture, turgor normal, no rashes or lesions. Exam is limited, not changed into gown for visit today.  Lymph nodes:    Cervical, supraclavicular nodes normal   Neurologic:    Normal strength, sensation; reflexes 2+                       Psych:   Normal mood, affect, hygiene and grooming       02/01/2024    8:44 AM 10/19/2023    1:58 PM 01/24/2023    8:50 AM 07/19/2022    3:37 PM 05/19/2022   12:05 PM  Depression screen PHQ 2/9  Decreased Interest 0 2 0 0 0  Down, Depressed, Hopeless 0 2 0 0 2  PHQ - 2 Score 0 4 0 0 2  Altered sleeping 0 2 0 0 0  Tired, decreased energy 3 2 0 0 1  Change in appetite 0 1 2 0 0  Feeling bad or failure about yourself  0 0 0 0 0  Trouble concentrating 0 1 0 0 1  Moving slowly or fidgety/restless 0 0 0 0 0  Suicidal thoughts 0  0 0 0  PHQ-9 Score 3 10 2  0 4  Difficult doing work/chores Not difficult at all Not difficult at all Not difficult at all Not difficult at all Somewhat difficult   Lab Results  Component Value Date   WBC 7.4 01/26/2024   HGB 12.7 01/26/2024   HCT 39.5 01/26/2024   MCV 93 01/26/2024   PLT 418 01/26/2024   Lab Results  Component Value Date   IRON 55 01/26/2024   TIBC 572 (HH) 01/26/2024   FERRITIN 52 01/26/2024   Lab Results  Component Value Date   VITAMINB12 618 01/26/2024   Vitamin D-OH 47.7    Chemistry      Component Value Date/Time   NA 142 01/26/2024 1013   K 4.4 01/26/2024 1013   CL 105  01/26/2024 1013   CO2 22 01/26/2024 1013   BUN 13 01/26/2024 1013   CREATININE 0.99 01/26/2024 1013   CREATININE 1.02 (H) 02/22/2023 1558   CREATININE 0.81 12/15/2015 0001      Component Value Date/Time   CALCIUM 9.2 01/26/2024 1013   ALKPHOS 61 01/26/2024 1013   AST 17 01/26/2024 1013   AST 17 02/22/2023 1558   ALT 9 01/26/2024 1013   ALT 13 02/22/2023 1558   BILITOT <0.2 01/26/2024 1013  BILITOT 0.3 02/22/2023 1558     Fasting glucose 80  Lab Results  Component Value Date   CHOL 132 01/26/2024   HDL 78 01/26/2024   LDLCALC 33 01/26/2024   TRIG 126 01/26/2024   CHOLHDL 1.7 01/26/2024     ASSESSMENT/PLAN:  Annual physical exam  Hypertriglyceridemia Assessment & Plan: Finally at goal--on rosuvastatin 5 mg, fenofibrate 160 mg. Continue lowfat diet.  Orders: -     Rosuvastatin Calcium; Take 1 tablet (5 mg total) by mouth daily.  Dispense: 90 tablet; Refill: 3  Vitamin D deficiency Assessment & Plan: Adequately replaced on current supplements (total of 10,000 IU daily). We previously discussed the high dose, and she mentioned concern about higher levels with weight loss (since we previously discussed it getting stored in fat).  Advised we will continue to monitor levels as she loses weight, and cut the dose back if/when needed.   Vitamin B12 deficiency Assessment & Plan: Adequately replaced. Continue injections q2 weeks   Iron deficiency anemia due to chronic blood loss Assessment & Plan: No longer taking iron. Periods recently restarted. Anticoagulated. Menses aren't heavy, but are every 20d. Hgb, iron and ferritin normal on recent check. Elevated TIBC, lower than last check.   Crohn's disease of small intestine with complication [facility]) Assessment & Plan: Doing very well on current regimen through Dr. Leone Payor. Prefers to keep ostomy. Notes slower, thickened output and no longer sees pills in it since starting the Zepbound this week.   Multiple sclerosis  (HCC) Assessment & Plan: Doing well on current regimen, under care of Dr. Everlena Cooper   Encounter for contraceptive management, unspecified type Assessment & Plan: Discussed her concerns re: Nexplanon. Reassured her that it is progesterone-only, just as with her Mirena IUD she previously used. She will contact GYN's office to schedule placement, and continue condoms in the interim.   Anticoagulant long-term use Assessment & Plan: Continue   History of pulmonary embolus (PE)  Recurrent major depressive disorder, in remission [facility]) Assessment & Plan: Depression improved, doing well on current regimen of wellbutrin 100mg  BID (NOT SR) and lexapro 10mg . Discussed possibility of switching to SR version, since slower transit, not seeing pills, but is doing well so will keep things the same.   Osteopenia, unspecified location Assessment & Plan: To d/w Dr. Leone Payor plans for repeating DEXA (last was in 10/2016), to be done on same machine at [facility] (always prev ordered by Dr. Leone Payor).   Class 1 obesity due to excess calories with body mass index (BMI) of 34.0 to 34.9 in adult, unspecified whether serious comorbidity present Assessment & Plan: Under treatment through [facility].  Zepbound started within the week. So far is tolerating. Discussed risks/SE, expected length of treatment as well as cessation (preferable to space out and taper dose rather than stop abruptly, in order to keep regain of weight down.)     Discussed monthly self breast exams and yearly mammograms (past due, reminded to schedule); at least 30 minutes of aerobic activity at least 5 days/week including weight-bearing exercise at least twice per week; proper sunscreen use reviewed; healthy diet, including goals of calcium and vitamin D intake and alcohol recommendations (less than or equal to 1 drink/day) reviewed; regular seatbelt use; changing batteries in smoke detectors.  Immunization recommendations discussed--encouraged  yearly flu shots. Declined COVID vaccine. Colonoscopy recommendations reviewed--UTD. DEXA discussed.  Last done through [facility], so will need to d/w Dr. Leone Payor to have him order to stay at same location.  F/u 1 year, sooner prn.  FTF time 60 mins, additional 25 mins spent in chart review and documentation. More than half spent on issues unrelated to wellness.

## 2024-01-31 NOTE — Patient Instructions (Incomplete)
  HEALTH MAINTENANCE RECOMMENDATIONS:  It is recommended that you get at least 30 minutes of aerobic exercise at least 5 days/week (for weight loss, you may need as much as 60-90 minutes). This can be any activity that gets your heart rate up. This can be divided in 10-15 minute intervals if needed, but try and build up your endurance at least once a week.  Weight bearing exercise is also recommended twice weekly.  Eat a healthy diet with lots of vegetables, fruits and fiber.  "Colorful" foods have a lot of vitamins (ie green vegetables, tomatoes, red peppers, etc).  Limit sweet tea, regular sodas and alcoholic beverages, all of which has a lot of calories and sugar.  Up to 1 alcoholic drink daily may be beneficial for women (unless trying to lose weight, watch sugars).  Drink a lot of water.  Calcium recommendations are 1200-1500 mg daily (1500 mg for postmenopausal women or women without ovaries), and vitamin D 1000 IU daily.  This should be obtained from diet and/or supplements (vitamins), and calcium should not be taken all at once, but in divided doses.  Monthly self breast exams and yearly mammograms for women over the age of 4 is recommended.  Sunscreen of at least SPF 30 should be used on all sun-exposed parts of the skin when outside between the hours of 10 am and 4 pm (not just when at beach or pool, but even with exercise, golf, tennis, and yard work!)  Use a sunscreen that says "broad spectrum" so it covers both UVA and UVB rays, and make sure to reapply every 1-2 hours.  Remember to change the batteries in your smoke detectors when changing your clock times in the spring and fall. Carbon monoxide detectors are recommended for your home.  Use your seat belt every time you are in a car, and please drive safely and not be distracted with cell phones and texting while driving.  Resume regular aerobic activity, starting slow and working your way up to a minimum of 150 minutes/week  (eventually).  I would recommend talking to Dr. Leone Payor about repeating DEXA (has to be ordered by him to get it done at [facility], should be done at same location as prior study). If next one doesn't show any decline, no need to check probably for 10 years (assuming no frequent steroid use during that time).  I encourage you to get yearly flu shots in the Fall, and consider the updated COVID booster when available.

## 2024-02-01 ENCOUNTER — Ambulatory Visit (INDEPENDENT_AMBULATORY_CARE_PROVIDER_SITE_OTHER): Payer: 59 | Admitting: Family Medicine

## 2024-02-01 ENCOUNTER — Encounter: Payer: Self-pay | Admitting: Family Medicine

## 2024-02-01 VITALS — BP 120/62 | HR 80 | Ht 64.5 in | Wt 206.0 lb

## 2024-02-01 DIAGNOSIS — E66811 Obesity, class 1: Secondary | ICD-10-CM

## 2024-02-01 DIAGNOSIS — Z0000 Encounter for general adult medical examination without abnormal findings: Secondary | ICD-10-CM | POA: Diagnosis not present

## 2024-02-01 DIAGNOSIS — Z309 Encounter for contraceptive management, unspecified: Secondary | ICD-10-CM

## 2024-02-01 DIAGNOSIS — E781 Pure hyperglyceridemia: Secondary | ICD-10-CM

## 2024-02-01 DIAGNOSIS — D500 Iron deficiency anemia secondary to blood loss (chronic): Secondary | ICD-10-CM

## 2024-02-01 DIAGNOSIS — G35D Multiple sclerosis, unspecified: Secondary | ICD-10-CM

## 2024-02-01 DIAGNOSIS — E559 Vitamin D deficiency, unspecified: Secondary | ICD-10-CM | POA: Diagnosis not present

## 2024-02-01 DIAGNOSIS — F3340 Major depressive disorder, recurrent, in remission, unspecified: Secondary | ICD-10-CM

## 2024-02-01 DIAGNOSIS — M8580 Other specified disorders of bone density and structure, unspecified site: Secondary | ICD-10-CM

## 2024-02-01 DIAGNOSIS — F324 Major depressive disorder, single episode, in partial remission: Secondary | ICD-10-CM

## 2024-02-01 DIAGNOSIS — E538 Deficiency of other specified B group vitamins: Secondary | ICD-10-CM | POA: Diagnosis not present

## 2024-02-01 DIAGNOSIS — E6609 Other obesity due to excess calories: Secondary | ICD-10-CM

## 2024-02-01 DIAGNOSIS — Z86711 Personal history of pulmonary embolism: Secondary | ICD-10-CM

## 2024-02-01 DIAGNOSIS — K50019 Crohn's disease of small intestine with unspecified complications: Secondary | ICD-10-CM

## 2024-02-01 DIAGNOSIS — Z7901 Long term (current) use of anticoagulants: Secondary | ICD-10-CM

## 2024-02-01 DIAGNOSIS — G35 Multiple sclerosis: Secondary | ICD-10-CM

## 2024-02-01 MED ORDER — ROSUVASTATIN CALCIUM 5 MG PO TABS: 5.0000 mg | ORAL_TABLET | Freq: Every day | ORAL | 3 refills | Status: AC

## 2024-02-04 ENCOUNTER — Encounter: Payer: Self-pay | Admitting: Family Medicine

## 2024-02-04 DIAGNOSIS — Z309 Encounter for contraceptive management, unspecified: Secondary | ICD-10-CM | POA: Insufficient documentation

## 2024-02-04 DIAGNOSIS — E66811 Other obesity due to excess calories: Secondary | ICD-10-CM | POA: Insufficient documentation

## 2024-02-04 NOTE — Assessment & Plan Note (Signed)
 No longer taking iron. Periods recently restarted. Anticoagulated. Menses aren't heavy, but are every 20d. Hgb, iron and ferritin normal on recent check. Elevated TIBC, lower than last check.

## 2024-02-04 NOTE — Assessment & Plan Note (Signed)
 Depression improved, doing well on current regimen of wellbutrin 100mg  BID (NOT SR) and lexapro 10mg . Discussed possibility of switching to SR version, since slower transit, not seeing pills, but is doing well so will keep things the same.

## 2024-02-04 NOTE — Assessment & Plan Note (Signed)
 Continue

## 2024-02-04 NOTE — Assessment & Plan Note (Signed)
 Adequately replaced. Continue injections q2 weeks

## 2024-02-04 NOTE — Assessment & Plan Note (Signed)
 Discussed her concerns re: Nexplanon. Reassured her that it is progesterone-only, just as with her Mirena IUD she previously used. She will contact GYN's office to schedule placement, and continue condoms in the interim.

## 2024-02-04 NOTE — Assessment & Plan Note (Signed)
 Finally at goal--on rosuvastatin 5 mg, fenofibrate 160 mg. Continue lowfat diet.

## 2024-02-04 NOTE — Assessment & Plan Note (Addendum)
 Adequately replaced on current supplements (total of 10,000 IU daily). We previously discussed the high dose, and she mentioned concern about higher levels with weight loss (since we previously discussed it getting stored in fat).  Advised we will continue to monitor levels as she loses weight, and cut the dose back if/when needed.

## 2024-02-04 NOTE — Assessment & Plan Note (Signed)
 Doing well on current regimen, under care of Dr. Everlena Cooper

## 2024-02-04 NOTE — Assessment & Plan Note (Signed)
 To d/w Dr. Leone Payor plans for repeating DEXA (last was in 10/2016), to be done on same machine at [facility] (always prev ordered by Dr. Leone Payor).

## 2024-02-04 NOTE — Assessment & Plan Note (Signed)
 Doing very well on current regimen through Dr. Leone Payor. Prefers to keep ostomy. Notes slower, thickened output and no longer sees pills in it since starting the Zepbound this week.

## 2024-02-04 NOTE — Assessment & Plan Note (Signed)
 Under treatment through [facility].  Zepbound started within the week. So far is tolerating. Discussed risks/SE, expected length of treatment as well as cessation (preferable to space out and taper dose rather than stop abruptly, in order to keep regain of weight down.)

## 2024-02-08 NOTE — Progress Notes (Unsigned)
 NEUROLOGY FOLLOW UP OFFICE NOTE  Lindsay Mccarthy [id number]  Assessment/Plan:   Multiple sclerosis - due to recent surgery and changes in medication to treat her Crohn's, will defer starting a new DMT for now.  She will be on immunosuppressant therapy for Crohn's anyway.  Compressive cervical myelopathy Bilateral arm/hand numbness - may be radiculopathy vs carpal tunnel syndrome   DMT: Defer.  Instead, taking Stelara for Crohn's disease D3 5000 IU twice daily (wouldn't increase dose further) gabapentin 100-200mg  QHS, Ampyra, baclofen 10mg  TID PRN MRI of brain, cervical and thoracic spine with and without contrast in 6 months Vit D level (she will have checked with PCP) Discussed getting second opinion regarding the cervical spinal stenosis.  She will get back to me. Follow up 6 months   Subjective:  Lindsay Mccarthy is a 48 year old right-handed woman with Crohn's disease, depression, and anxiety who follows up for multiple sclerosis.    UPDATE: Current disease modifying therapy: None.  Takes Stelara for Crohn's Other medication:  D3 10,000 IU daily, B12 injection, Ampyra, gabapentin 300mg  at bedtime, baclofen 10-20mg  at bedtime, Wellbutrin, Lexapro 10mg , ,Colestid, Bentayl, Eliquis, Mirena  MRI of brain/cervical/thoracic spine with and without contrast on 08/27/2023 were stable.  Vision:  No issues Motor:  No new issues Sensory:  as above Pain:  Spasms in legs as well as abdomen. Gait:  On Ampyra.  States gait overall improved.  No longer uses a walker.  Uses the cane once in awhile in the home.  Now has a support dog to help guide. Bowel/Bladder:  Occasional urinary incontinence when stressed.  Crohn's disease.  Has ostomy Fatigue:  Some fatigue. Cognition:  Trouble multi-tasking.   Mood:  Stable   HISTORY: She reports history of various neurologic symptoms since her late 46s: -  Urinary incontinence:  It started about a year ago and has gotten worse.  It occurs  suddenly, once or twice a month, usually at home and while standing, such as while washing dishes with the water running.  It occurs during fatigued or emotional stress as well.  She denies increased urinary frequency or dysuria.  No perineal numbness.  No low back pain.   - She reports poor night vision with difficulty seeing while driving at night. She has trouble focusing.  Recent eye exam showed poor "refocusing", which would normally be seen in an older adult.  Her sister had Fuchs dystrophy but she doesn't have it. -  She reports numbness and tingling in her hands when she wakes up in the morning or in the evening after work. -  She reports abnormal gait.  It feels like her feet are stuck in cement.  She has trouble lifting her feet.  It causes hip pain.  She trips over her feet.  It is worse when there is an extreme change in temperature.  She started having trouble in her mid-30s.  Worse over the past 2 years.  She reports a stabbing pain in her hips or knees.  She reports previously being diagnosed with a pinched nerve in the back that caused a left foot drop in the 20s. She had PT and injections which was ineffective.  No numbness in the legs.   -  When she throws a ball, her hand doesn't release immediately.  There is a delay until she is able to release the ball.  She has similar problems with her left hand.  No neck pain. -  She reports fatigued.  She needs to take  a nap during the day.  Prior sleep study was normal. - Trouble multitasking.  She underwent neuropsychological testing on 09/04/2019.  Performance fell largely within normal limits.  She did exhibit a weakness across aspects of attention/concentration and some performance variability across processing speed.  Performance involving complex attention, executive functioning, receptive and expressive language, visual-spatial abilities and verbal and visual learning and memory were within normal range.  In addition to cord involvement of  MS, she also has severe cervical spinal stenosis.  In November 2023, she saw neurosurgery at [facility] regarding cervical spinal stenosis causing compressive myelopathy.  As it is uncertain how much symptoms are related to MS or the C4-5 stenosis, surgery was deferred.  She underwent epidural injections which did helped with arm numbness and pain but not with balance and not effective enough that she wanted to return for repeat injections.   One of her two sisters has multiple sclerosis.   Other history:  She has Crohn's disease.  Earlier this year, she developed a DVT in her right leg.  She also was found to have B12 deficiency and is being treated with injections.   01/09/18 LABS:  B12 143, Sed rate 18.  Hypercoagulable panel unremarkable, including lupus anticoagulant panel, dRVVT mix, beta-2-glycoprotein, cardiolipin antibodies, prothrombin gene mutation, factor V Leiden.  NMO/AQP4+ antibody was negative.  Her insurance would not cover anti-MOG antibody testing.   09/2018 LABS:  JC Virus antibody was positive with index of 3.26.  Quantiferon-TB Gold Plus negative.  She tested negative for Hep B.      Imaging: 07/22/2018:  MRI C & T-SPINE W WO:  Multiple T2/STIR hyperintense signal within the spinal cord at C2, C3-C4, C6-C7, T1, T3-T4, T6-T7, and T12 levels, all non-enhancing.  She also demonstrates degenerative spinal stenosis at C4-C5 and C5-C6 with mild spinal cord mass effect. 07/24/2018:  MRI BRAIN W WO:  Mild hyperintense in the periventricular and juxtacortical white matter with signal running perpendicular from the lateral ventricle and involving the corpus callosum, non-enhancing. 04/05/2020 MRI C & T-SPINE W WO:  Chronic patchy multifocal cord signal abnormality throughout cervical and thoracic cord, overall stable compared to 2019.  No active lesions.  Degenerative cervical spondylosis at C4-5 and C5-6 with mild to moderate spinal stenosis and moderate left C5 foraminal  narrowing. 05/02/2020 MRI BRAIN W WO:  Unchanged mild subcortical/juxtacortical and deep periventricular white matter foci of signal abnormality consistent with the provided history of multiple sclerosis. No new or enhancing lesion is identified. 07/07/2021 MRI BRAIN W WO:  Similar mild T2 white matter hyperintensities, compatible with the reported history of multiple sclerosis. No convincing new or enhancing lesions identified. 07/19/2021 MRI C-SPINE W WO:  1. Multiple T2 hyperintense cord lesions consistent with the clinical diagnosis of multiple sclerosis. No new or enhancing lesion identified.  2. Degenerative changes at C4-5 with mild progression of spinal canal stenosis which is moderate, with mass effect on the cord.  There is also mild-to-moderate right and moderate left neural foraminal narrowing at this level. 07/24/2022 MRI BRAIN W WO:  1. Unchanged mild T2 hyperintense foci. No new or enhancing lesions to suggest progressive or active demyelination.  2. No acute intracranial process. 07/17/2022 MRI C & T-SPINE W WO:  1. Redemonstrated scattered T2 hyperintense lesions within the cervical and thoracic spinal cord, not significantly changed from prior exam, and compatible with demyelinating disease. No evidence of contrast enhancement to suggest active demyelination.  2. Compared to prior exam there is new T2  hyperintense signal within the central spinal cord a the C4-C5 and T6-T7 levels where there is moderate to severe spinal canal stenosis. This is favored to represent changes related to chronic compressive myelopathy. 08/27/2023 MRI BRAIN W WO:  Mild cerebral white matter disease, unchanged from the prior brain MRI of 07/24/2022 and compatible with the provided history of multiple sclerosis. No pathologic enhancement to suggest active demyelination. 08/27/2023 MRI C-SPINE W WO:  1. Chronic T2 hyperintense lesions within the cervical spinal cord, as described and unchanged from the prior MRI of  07/17/2022. Findings are compatible with the provided history of multiple sclerosis. No pathologic enhancement to suggest active demyelination. 2. Cervical spondylosis as outlined and not significantly changed from the prior MRI. Findings are most notably as follows. 3. At C4-C5, there is moderate disc degeneration with mild degenerative endplate edema and enhancement. Multifactorial severe spinal canal stenosis with spinal cord flattening. Subtle T2 hyperintense signal abnormality within the spinal cord at this level, which was better appreciated on the prior MRI, and may reflect myelomalacia and/or focal edema. Bilateral neural foraminal narrowing (moderate/severe right, severe left). 4. At C5-C6, there is moderate disc degeneration. A disc bulge and central disc protrusion effaces the ventral thecal sac and mildly flattens the ventral aspect of the spinal cord. However, the dorsal CSF space is maintained within the spinal canal. Left-sided uncovertebral hypertrophy contributes to moderate left neural foraminal narrowing. 08/27/2023 MRI T-SPINE W WO:  1. Known T2 hyperintense lesions within the thoracic spinal cord at the T1-T2, T3-T4, T9-T10 and T12 levels, some of which were better appreciated on the prior MRI of 07/17/2022. Findings are compatible with the provided history of multiple sclerosis. 2. T2 hyperintense signal abnormality also again noted within the central and right aspect of the spinal cord at the T6-T7 level. A central disc protrusion effaces the ventral thecal sac and mildly flattens the ventral aspect of the spinal cord at this level. Thus, this focus of cord signal abnormality could reflect a chronic demyelinating lesion and/or compressive myelopathy. 3. Background thoracic spondylosis, as outlined and unchanged. No more than mild spinal canal narrowing at the remaining thoracic levels. No significant foraminal stenosis.  Past DMT:  Ocrevus (effective but would develop recurrence of chronic  symptoms prior to next infusion), Kesimpta (possibly caused Crohn's flare)  Past medications:  gabapentin   PAST MEDICAL HISTORY: Past Medical History:  Diagnosis Date   Anemia    related to Crohns flares   Arm DVT (deep venous thromboembolism), acute, right (HCC) 11/29/2017   Arthritis    knees, feet, hands, wrists, related to Crohns flare   Asthma    Childhood   B12 deficiency    monitored/treated by Dr. Leone Payor   Cervical dysplasia    Clotting disorder (HCC)    on eliquis hx dvt/ pulmonary embolism   COVID 04/13/2021   Crohn disease (HCC)    Crohn's disease of small intestine (HCC) 06/24/2012   Diagnosed 1999, in [place]. Ileitis only then. Treated with Imuran Remicade and prednisone.  Noncompliant with therapy. 2004 return to care and was treated with Remicade prednisone Pentasa Cipro and Flagyl. 2006 status post right hemicolectomy. Subsequently treated with azathioprine and Cimzia. 200 mg every other week.  azathioprine was added in 2010. Prometheus TP MT enzyme was negative.   Depression    DVT of upper extremity (deep vein thrombosis) (HCC) 11/28/2017   Eczema    arms and behind knees, worse in winter   Generalized anxiety disorder    History of  recurrent UTI (urinary tract infection)    HPV (human papilloma virus) infection    Hyperlipemia    Hypertriglyceridemia 07/03/2012   10/2010 labs showed level of 753 with total cholesterol 180 and HDL 41' Labs 05/2013--TG 180    Major depressive disorder in remission (HCC) 10/03/2013   MS (multiple sclerosis) (HCC)    Multiple sclerosis (HCC) 2019   Officially diagnosed in the Fall   Neuromuscular disorder (HCC)    Osteopenia 09/29/2011   T-1.6   Papanicolaou smear 10/2011   last abnormal 2011   Pulmonary embolism (HCC) 11/30/2017   Scaphoid fracture of wrist 2013   left   Seasonal allergic rhinitis    Shingles 09/2015   R hip   Vitamin B12 deficiency 06/19/2012   Vitamin D deficiency 12/16/2015   Vitamin D-OH level  12     MEDICATIONS: Current Outpatient Medications on File Prior to Visit  Medication Sig Dispense Refill   acetaminophen (TYLENOL) 500 MG tablet Take 1,000 mg by mouth every 6 (six) hours as needed for headache (pain). Reported on 12/15/2015 (Patient not taking: Reported on 02/01/2024)     baclofen (LIORESAL) 10 MG tablet TAKE 1 TABLET(10 MG) BY MOUTH THREE TIMES DAILY AS NEEDED FOR MUSCLE SPASMS 90 tablet 5   buPROPion (WELLBUTRIN) 100 MG tablet TAKE 1 TABLET(100 MG) BY MOUTH TWICE DAILY 180 tablet 0   cetirizine (ZYRTEC) 10 MG tablet Take 10 mg by mouth at bedtime.      cyanocobalamin (DODEX) 1000 MCG/ML injection INJECT 1 ML INTO THE SKIN EVERY 14 DAYS 10 mL 4   dalfampridine 10 MG TB12 TAKE 1 TABLET BY MOUTH EVERY 12  HOURS 60 tablet 5   diphenoxylate-atropine (LOMOTIL) 2.5-0.025 MG tablet Take 1 to 2 tablets by mouth every 6 to 8 hours as needed for diarrhea (Patient not taking: Reported on 02/01/2024) 30 tablet 1   ELIQUIS 5 MG TABS tablet TAKE 1 TABLET BY MOUTH TWICE  DAILY 180 tablet 3   escitalopram (LEXAPRO) 10 MG tablet TAKE 1 TABLET BY MOUTH AT  BEDTIME 90 tablet 0   fenofibrate 160 MG tablet TAKE 1 TABLET BY MOUTH AT  BEDTIME 90 tablet 0   gabapentin (NEURONTIN) 100 MG capsule TAKE 3 CAPSULES BY MOUTH AT  BEDTIME (Patient taking differently: 200 mg at bedtime. TAKE 3 CAPSULES BY MOUTH AT  BEDTIME) 270 capsule 1   Insulin Pen Needle 33G X 5 MM MISC 1 each by Does not apply route every 14 (fourteen) days. 100 each 2   NON FORMULARY Take 1 each by mouth at bedtime. CBD/ cannabis     rosuvastatin (CRESTOR) 5 MG tablet Take 1 tablet (5 mg total) by mouth daily. 90 tablet 3   STELARA 90 MG/ML SOSY injection INJECT 1 SYRINGE SUBCUTANEOUSLY  EVERY 8 WEEKS 1 mL 5   tirzepatide (ZEPBOUND) 2.5 MG/0.5ML injection vial Inject 2.5 mg into the skin once a week.     VITAMIN D PO Take 5,000 Int'l Units by mouth in the morning and at bedtime.     No current facility-administered medications on file  prior to visit.    ALLERGIES: Allergies  Allergen Reactions   Iron Other (See Comments)    Blood in stool/Rectal Bleeding   Iron Sucrose Other (See Comments)    Fever, infusion reaction  Flu like symptoms   Ibuprofen Other (See Comments)    Avoids due to Crohns disease   Morphine And Codeine Itching    itchy   Ofatumumab Other (See Comments)  Crohn's flare   Topamax [Topiramate] Other (See Comments)    Headache (only took x 3d)   Adhesive [Tape] Rash    Rash with electrodes   Prednisone Other (See Comments) and Anxiety    "crazy", hallucinations Tolerates methylprednisolone hallucinations    FAMILY HISTORY: Family History  Problem Relation Age of Onset   Hypertension Mother    Breast cancer Mother 42   Bone cancer Mother        metastatic from breast   Depression Mother    Hypothyroidism Mother    Colon polyps Father    Diabetes Father        borderline--resolved 2020   Hypertension Father        off meds now   Hyperlipidemia Father        off meds now   Fuch's dystrophy Father    Multiple sclerosis Sister        affecting brain only   Alcohol abuse Sister    Fuch's dystrophy Sister    Hypertension Sister    Heart disease Sister        poss MI in 43's   Stroke Maternal Grandmother    Lung cancer Maternal Grandmother    Hypertension Maternal Grandmother    Heart disease Paternal Grandmother    Hypertension Paternal Grandmother    Colon cancer Paternal Grandfather    Stomach cancer Neg Hx    Esophageal cancer Neg Hx    Pancreatic cancer Neg Hx       Objective:  *** General: No acute distress.  Patient appears well-groomed.   Head:  Normocephalic/atraumatic Eyes:  Fundi examined but not visualized Neck: supple, no paraspinal tenderness, full range of motion Heart:  Regular rate and rhythm Back: No paraspinal tenderness Neurological Exam: alert and oriented.  Speech fluent and not dysarthric, language intact.  CN II-XII intact. Increased tone in  lower extremities.  Muscle strength 5-/5 right hip flexion, otherwise 5/5 throughout.  Sensation to pinprick and vibration intact.  Deep tendon reflexes 3+ in left patellar, otherwise 2+ throughout.  Bilateral Babinski sign present.  Finger to nose intact.  Spastic gait.  Ambulates with her guide dog.  [place] ***   Shon Millet, DO  CC: Joselyn Arrow, MD

## 2024-02-09 ENCOUNTER — Ambulatory Visit (INDEPENDENT_AMBULATORY_CARE_PROVIDER_SITE_OTHER): Admitting: Neurology

## 2024-02-09 ENCOUNTER — Encounter: Payer: Self-pay | Admitting: Neurology

## 2024-02-09 DIAGNOSIS — G35 Multiple sclerosis: Secondary | ICD-10-CM | POA: Diagnosis not present

## 2024-02-09 DIAGNOSIS — M62838 Other muscle spasm: Secondary | ICD-10-CM

## 2024-02-09 MED ORDER — GABAPENTIN 100 MG PO CAPS: ORAL_CAPSULE | ORAL | 1 refills | Status: DC

## 2024-02-09 MED ORDER — BACLOFEN 10 MG PO TABS: ORAL_TABLET | ORAL | 5 refills | Status: DC

## 2024-02-10 ENCOUNTER — Other Ambulatory Visit: Payer: Self-pay | Admitting: Internal Medicine

## 2024-02-10 ENCOUNTER — Telehealth: Payer: Self-pay

## 2024-02-10 DIAGNOSIS — Z7960 Long term (current) use of unspecified immunomodulators and immunosuppressants: Secondary | ICD-10-CM

## 2024-02-10 DIAGNOSIS — Z111 Encounter for screening for respiratory tuberculosis: Secondary | ICD-10-CM

## 2024-02-10 DIAGNOSIS — K50012 Crohn's disease of small intestine with intestinal obstruction: Secondary | ICD-10-CM

## 2024-02-10 DIAGNOSIS — D509 Iron deficiency anemia, unspecified: Secondary | ICD-10-CM

## 2024-02-10 NOTE — Telephone Encounter (Signed)
 Lab orders placed.

## 2024-02-10 NOTE — Telephone Encounter (Signed)
 Thanks for catching the order mistake   We can do the labs she requested and would do a CBC also  Dx iron deficiency anemia

## 2024-02-10 NOTE — Telephone Encounter (Signed)
 I spoke with Lindsay Mccarthy and she will come to have her lab work drawn May 9th. I entered orders to check the Stelara levels and AB's. I canceled the Entyvio orders. She is requesting to get her iron, ferritin and iron saturation checked at the same time Sir. Please advise. She had her IUD removed and now gets a period. Her PCP checked a CBC 01/26/2024. I booked her to see Dr Leone Payor on 03/28/2024 at 3:10pm, okay per Dr Leone Payor.

## 2024-02-11 ENCOUNTER — Other Ambulatory Visit: Payer: Self-pay | Admitting: Internal Medicine

## 2024-02-11 DIAGNOSIS — K50012 Crohn's disease of small intestine with intestinal obstruction: Secondary | ICD-10-CM

## 2024-02-18 ENCOUNTER — Other Ambulatory Visit: Payer: Self-pay | Admitting: Family Medicine

## 2024-02-18 DIAGNOSIS — E781 Pure hyperglyceridemia: Secondary | ICD-10-CM

## 2024-03-09 ENCOUNTER — Other Ambulatory Visit (INDEPENDENT_AMBULATORY_CARE_PROVIDER_SITE_OTHER)

## 2024-03-09 ENCOUNTER — Ambulatory Visit: Admitting: Physician Assistant

## 2024-03-09 DIAGNOSIS — D509 Iron deficiency anemia, unspecified: Secondary | ICD-10-CM | POA: Diagnosis not present

## 2024-03-09 DIAGNOSIS — K50012 Crohn's disease of small intestine with intestinal obstruction: Secondary | ICD-10-CM

## 2024-03-09 DIAGNOSIS — Z7960 Long term (current) use of unspecified immunomodulators and immunosuppressants: Secondary | ICD-10-CM

## 2024-03-09 DIAGNOSIS — Z111 Encounter for screening for respiratory tuberculosis: Secondary | ICD-10-CM

## 2024-03-09 LAB — CBC WITH DIFFERENTIAL/PLATELET
Basophils Absolute: 0 10*3/uL (ref 0.0–0.1)
Basophils Relative: 0.5 % (ref 0.0–3.0)
Eosinophils Absolute: 0.2 10*3/uL (ref 0.0–0.7)
Eosinophils Relative: 2.8 % (ref 0.0–5.0)
HCT: 41.9 % (ref 36.0–46.0)
Hemoglobin: 13.9 g/dL (ref 12.0–15.0)
Lymphocytes Relative: 19.1 % (ref 12.0–46.0)
Lymphs Abs: 1.4 10*3/uL (ref 0.7–4.0)
MCHC: 33.2 g/dL (ref 30.0–36.0)
MCV: 90.8 fl (ref 78.0–100.0)
Monocytes Absolute: 0.7 10*3/uL (ref 0.1–1.0)
Monocytes Relative: 9.5 % (ref 3.0–12.0)
Neutro Abs: 4.9 10*3/uL (ref 1.4–7.7)
Neutrophils Relative %: 68.1 % (ref 43.0–77.0)
Platelets: 372 10*3/uL (ref 150.0–400.0)
RBC: 4.61 Mil/uL (ref 3.87–5.11)
RDW: 13.3 % (ref 11.5–15.5)
WBC: 7.2 10*3/uL (ref 4.0–10.5)

## 2024-03-09 LAB — IBC + FERRITIN
Ferritin: 29.3 ng/mL (ref 10.0–291.0)
Iron: 105 ug/dL (ref 42–145)
Saturation Ratios: 15.9 % — ABNORMAL LOW (ref 20.0–50.0)
TIBC: 660.8 ug/dL — ABNORMAL HIGH (ref 250.0–450.0)
Transferrin: 472 mg/dL — ABNORMAL HIGH (ref 212.0–360.0)

## 2024-03-11 LAB — CALPROTECTIN, FECAL: Calprotectin, Fecal: 42 ug/g (ref 0–120)

## 2024-03-13 LAB — QUANTIFERON-TB GOLD PLUS
Mitogen-NIL: 2.71 [IU]/mL
NIL: 0.01 [IU]/mL
QuantiFERON-TB Gold Plus: NEGATIVE
TB1-NIL: 0 [IU]/mL
TB2-NIL: 0 [IU]/mL

## 2024-03-14 ENCOUNTER — Other Ambulatory Visit: Payer: Self-pay | Admitting: Family Medicine

## 2024-03-14 ENCOUNTER — Other Ambulatory Visit: Payer: Self-pay | Admitting: Hematology

## 2024-03-14 DIAGNOSIS — Z86711 Personal history of pulmonary embolism: Secondary | ICD-10-CM

## 2024-03-14 DIAGNOSIS — F325 Major depressive disorder, single episode, in full remission: Secondary | ICD-10-CM

## 2024-03-14 DIAGNOSIS — E538 Deficiency of other specified B group vitamins: Secondary | ICD-10-CM

## 2024-03-14 DIAGNOSIS — D509 Iron deficiency anemia, unspecified: Secondary | ICD-10-CM

## 2024-03-14 DIAGNOSIS — E781 Pure hyperglyceridemia: Secondary | ICD-10-CM

## 2024-03-14 DIAGNOSIS — Z7901 Long term (current) use of anticoagulants: Secondary | ICD-10-CM

## 2024-03-14 NOTE — Telephone Encounter (Signed)
 Contact PCP

## 2024-03-18 ENCOUNTER — Ambulatory Visit: Payer: Self-pay | Admitting: Internal Medicine

## 2024-03-18 LAB — SERIAL MONITORING

## 2024-03-19 LAB — USTEKINUMAB AND ANTI-USTEK AB
Anti-Ustekinumab Antibody: <40 ng/mL
Ustekinumab: 0.5 ug/mL

## 2024-03-28 ENCOUNTER — Encounter: Payer: Self-pay | Admitting: Internal Medicine

## 2024-03-28 ENCOUNTER — Ambulatory Visit (INDEPENDENT_AMBULATORY_CARE_PROVIDER_SITE_OTHER): Admitting: Internal Medicine

## 2024-03-28 VITALS — BP 106/70 | HR 96 | Ht 64.0 in | Wt 195.0 lb

## 2024-03-28 DIAGNOSIS — K435 Parastomal hernia without obstruction or  gangrene: Secondary | ICD-10-CM

## 2024-03-28 DIAGNOSIS — Z7960 Long term (current) use of unspecified immunomodulators and immunosuppressants: Secondary | ICD-10-CM

## 2024-03-28 DIAGNOSIS — K50012 Crohn's disease of small intestine with intestinal obstruction: Secondary | ICD-10-CM

## 2024-03-28 DIAGNOSIS — K469 Unspecified abdominal hernia without obstruction or gangrene: Secondary | ICD-10-CM

## 2024-03-28 DIAGNOSIS — Z932 Ileostomy status: Secondary | ICD-10-CM

## 2024-03-28 DIAGNOSIS — D509 Iron deficiency anemia, unspecified: Secondary | ICD-10-CM

## 2024-03-28 NOTE — Progress Notes (Unsigned)
 Lindsay Mccarthy 48 y.o. Jul 30, 1976 [id number]  Assessment & Plan:   Encounter Diagnoses  Name Primary?   Crohn's disease of small intestine with intestinal obstruction (HCC) Yes   Long-term use of immunosuppressant medication    Iron  deficiency anemia, unspecified iron  deficiency anemia type    Ileostomy in place [facility])    Peristomal hernia      Clinically feels well.  I am concerned about low level of ustekinumab  at trough.  Could have been due to interruption in therapy.  We have decided to pursue monthly ustekinumab .     Subjective:   Chief Complaint: Follow-up of Crohn's disease  HPI 48 year old woman with a history of Crohn's disease status post 2 surgeries, the most recent was in February 2023 with resection of ilio colonic anastomotic stricture by Dr. Ara Mccarthy, and end ileostomy.  She also has multiple sclerosis and is on Stelara  for treatment of her Crohn's disease.  She was last seen in this clinic in August 2024 by Lindsay Mccarthy.  She was doing well then and reports she is doing well now.  She does notice that she has a peristomal hernia that usually does not bother her though when she first realized she had it she went to urgent care she had been coughing it was somewhat firm.  Loose output from the ileostomy is unchanged.  No fever or chills or other complications.  She has started Zepbound for weight loss.  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  She has no bleeding at the ostomy.  There is no discharge from the rectum.  She does have iron  deficiency anemia, this is not a new issue she had EGD and colonoscopy for that in 2022.  Recently started menstruating again because Lindsay Mccarthy could not be reinserted.  Menses are not heavy.  She has not really been anemic but her iron  counts were low and she was started on iron  tablet in the last month.  She had previously taken liquid iron  therapy but it was thought that that raised her triglycerides significantly so it was stopped.   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 Last colon 4/22 pre-op  Wt Readings from Last 3 Encounters:  03/28/24 195 lb (88.5 kg)  02/09/24 207 lb (93.9 kg)  02/01/24 206 lb (93.4 kg)     Allergies  Allergen Reactions   Iron  Other (See Comments)    Blood in stool/Rectal Bleeding   Iron  Sucrose Other (See Comments)    Fever, infusion reaction  Flu like symptoms   Ibuprofen Other (See Comments)    Avoids due to Crohns disease   Morphine  And Codeine Itching    itchy   Ofatumumab  Other (See Comments)    Crohn's flare   Topamax [Topiramate] Other (See Comments)    Headache (only took x 3d)   Adhesive [Tape] Rash    Rash with electrodes   Prednisone Other (See Comments) and Anxiety    "crazy", hallucinations Tolerates methylprednisolone  hallucinations   Current Meds  Medication Sig   acetaminophen  (TYLENOL ) 500 MG tablet Take 1,000 mg by mouth every 6 (six) hours as needed for headache (pain). Reported on 12/15/2015   baclofen  (LIORESAL ) 10 MG tablet TAKE 1 TABLET(10 MG) BY MOUTH THREE TIMES DAILY AS NEEDED FOR MUSCLE SPASMS   buPROPion  (WELLBUTRIN ) 100 MG tablet TAKE 1 TABLET(100 MG) BY MOUTH TWICE DAILY   cetirizine (ZYRTEC) 10 MG tablet Take 10 mg by mouth at bedtime.    cyanocobalamin  (DODEX ) 1000 MCG/ML injection INJECT 1 ML INTO  THE SKIN EVERY 14 DAYS   dalfampridine  10 MG TB12 TAKE 1 TABLET BY MOUTH EVERY 12  HOURS   diphenoxylate -atropine  (LOMOTIL ) 2.5-0.025 MG tablet Take 1 to 2 tablets by mouth every 6 to 8 hours as needed for diarrhea   ELIQUIS  5 MG TABS tablet TAKE 1 TABLET BY MOUTH TWICE  DAILY   escitalopram  (LEXAPRO ) 10 MG tablet TAKE 1 TABLET BY MOUTH AT  BEDTIME   fenofibrate  160 MG tablet TAKE 1 TABLET BY MOUTH AT  BEDTIME   ferrous sulfate  325 (65 FE) MG EC tablet Take 325 mg by mouth daily.   gabapentin  (NEURONTIN ) 100 MG capsule TAKE 3 CAPSULES BY MOUTH AT  BEDTIME   Insulin  Pen Needle 33G X 5 MM MISC 1 each by Does not apply route every 14 (fourteen) days.    NON FORMULARY Take 1 each by mouth at bedtime. CBD/ cannabis   rosuvastatin  (CRESTOR ) 5 MG tablet Take 1 tablet (5 mg total) by mouth daily.   STELARA  90 MG/ML SOSY injection INJECT 1 SYRINGE SUBCUTANEOUSLY  EVERY 8 WEEKS   tirzepatide 5 MG/0.5ML injection vial Inject into the skin once a week.   VITAMIN D  PO Take 5,000 Int'l Units by mouth in the morning and at bedtime.   Past Medical History:  Diagnosis Date   Anemia    related to Crohns flares   Arm DVT (deep venous thromboembolism), acute, right (HCC) 11/29/2017   Arthritis    knees, feet, hands, wrists, related to Crohns flare   Asthma    Childhood   B12 deficiency    monitored/treated by Dr. Willy Mccarthy   Cervical dysplasia    Clotting disorder (HCC)    on eliquis  hx dvt/ pulmonary embolism   COVID 04/13/2021   Crohn disease (HCC)    Crohn's disease of small intestine (HCC) 06/24/2012   Diagnosed 1999, in [place] . Ileitis only then. Treated with Imuran  Remicade and prednisone.  Noncompliant with therapy. 2004 return to care and was treated with Remicade prednisone Pentasa Cipro and Flagyl. 2006 status post right hemicolectomy. Subsequently treated with azathioprine  and Cimzia . 200 mg every other week.  azathioprine  was added in 2010. Prometheus TP MT enzyme was negative.   Depression    DVT of upper extremity (deep vein thrombosis) (HCC) 11/28/2017   Eczema    arms and behind knees, worse in winter   Generalized anxiety disorder    History of recurrent UTI (urinary tract infection)    HPV (human papilloma virus) infection    Hyperlipemia    Hypertriglyceridemia 07/03/2012   10/2010 labs showed level of 753 with total cholesterol 180 and HDL 41' Labs 05/2013--TG 180    Major depressive disorder in remission (HCC) 10/03/2013   MS (multiple sclerosis) (HCC)    Multiple sclerosis (HCC) 2019   Officially diagnosed in the Fall   Neuromuscular disorder (HCC)    Osteopenia 09/29/2011   T-1.6   Papanicolaou smear 10/2011   last  abnormal 2011   Pulmonary embolism (HCC) 11/30/2017   Scaphoid fracture of wrist 2013   left   Seasonal allergic rhinitis    Shingles 09/2015   R hip   Vitamin B12 deficiency 06/19/2012   Vitamin D  deficiency 12/16/2015   Vitamin D -OH level 12    Past Surgical History:  Procedure Laterality Date   APPENDECTOMY     CERVICAL BIOPSY  W/ LOOP ELECTRODE EXCISION  11/02/2007   ---paps normal since   CERVICAL DISC ARTHROPLASTY  12/12/2023   C4-C5, Dr. Retha Cast ([facility])  COLONOSCOPY  multiple   scanned   ESOPHAGOGASTRODUODENOSCOPY  multiple   scanned   HEMICOLECTOMY  11/01/2004   ILEOCECETOMY  12/11/2021   and end ileostomy; at [facility]   IR ANGIOGRAM PULMONARY BILATERAL SELECTIVE  12/03/2017   IR ANGIOGRAM SELECTIVE EACH ADDITIONAL VESSEL  12/03/2017   IR ANGIOGRAM SELECTIVE EACH ADDITIONAL VESSEL  12/03/2017   IR INFUSION THROMBOL ARTERIAL INITIAL (MS)  12/03/2017   IR INFUSION THROMBOL ARTERIAL INITIAL (MS)  12/03/2017   IR IVC FILTER PLMT / S&I /IMG GUID/MOD SED  12/04/2017   IR THROMB F/U EVAL ART/VEN FINAL DAY (MS)  12/04/2017   IR US  GUIDE VASC ACCESS RIGHT  12/03/2017   IVC FILTER REMOVAL N/A 07/11/2018   Procedure: IVC FILTER REMOVAL;  Surgeon: Margherita Shell, MD;  Location: [facility];  Service: Cardiovascular;  Laterality: N/A;   IVC VENOGRAPHY N/A 07/11/2018   Procedure: IVC Venography;  Surgeon: Margherita Shell, MD;  Location: [facility];  Service: Cardiovascular;  Laterality: N/A;   LEEP  11/01/2009   --done in [place]    PERIPHERAL VASCULAR BALLOON ANGIOPLASTY Right 11/30/2017   Procedure: PERIPHERAL VASCULAR BALLOON ANGIOPLASTY;  Surgeon: Margherita Shell, MD;  Location: [facility];  Service: Cardiovascular;  Laterality: Right;   PERIPHERAL VASCULAR THROMBECTOMY N/A 11/29/2017   Procedure: PERIPHERAL VASCULAR THROMBECTOMY - THROMBOLYSIS;  Surgeon: Margherita Shell, MD;  Location: [facility];  Service: Cardiovascular;  Laterality: N/A;   LYSIS CATHETER PLACEMENT ONLY   PERIPHERAL VASCULAR THROMBECTOMY N/A 11/30/2017   Procedure: PERIPHERAL VASCULAR THROMBECTOMY - Lysis Recheck;  Surgeon: Margherita Shell, MD;  [facility]: [facility];  Service: Cardiovascular;  Laterality: N/A;   WISDOM TOOTH EXTRACTION     Social History   Social History Narrative   The patient is divorced.     Re-married Andy Bannister, partner of 10 years, on 10/10/16. 3 dogs, 1 cat. 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   Moved from [place]  to [place] in 2013.   Past smoker   No alcohol   2-3 caffeinated beverages a day   She reports she is compliant with sunscreen given her increased risk of sun damage and skin cancer (no longer on azathioprine )      Updated 01/2024   family history includes Alcohol abuse in her sister; Bone cancer in her mother; Breast cancer (age of onset: 39) in her mother; Colon cancer in her paternal grandfather; Colon polyps in her father; Depression in her mother; Diabetes in her father; Fuch's dystrophy in her father and sister; Heart disease in her paternal grandmother and sister; Hyperlipidemia in her father; Hypertension in her father, maternal grandmother, mother, paternal grandmother, and sister; Hypothyroidism in her mother; Lung cancer in her maternal grandmother; Multiple sclerosis in her sister; Stroke in her maternal grandmother.   Review of Systems   Objective:   Physical Exam @BP  106/70   Pulse 96   Ht 5\' 4"  (1.626 m)   Wt 195 lb (88.5 kg)   BMI 33.47 kg/m @  General:  NAD Eyes:   anicteric Lungs:  clear Heart::  S1S2 no rubs, murmurs or gallops Abdomen:  soft and nontender, BS+ RLQ ileostomy - moderate peristomal hernia soft and nontender Ext:   no edema, cyanosis or clubbing    Data Reviewed:

## 2024-03-28 NOTE — Patient Instructions (Addendum)
 _______________________________________________________  If your blood pressure at your visit was 140/90 or greater, please contact your primary care physician to follow up on this.  _______________________________________________________  If you are age 46 or older, your body mass index should be between 23-30. Your Body mass index is 33.47 kg/m. If this is out of the aforementioned range listed, please consider follow up with your Primary Care Provider.  If you are age 30 or younger, your body mass index should be between 19-25. Your Body mass index is 33.47 kg/m. If this is out of the aformentioned range listed, please consider follow up with your Primary Care Provider.   ________________________________________________________  The [facility] providers would like to encourage you to use MYCHART to communicate with providers for non-urgent requests or questions.  Due to long hold times on the telephone, sending your provider a message by [facility] may be a faster and more efficient way to get a response.  Please allow 48 business hours for a response.  Please remember that this is for non-urgent requests.  _______________________________________________________  Please call us  in 3 weeks if you haven't heard anything regarding doing your stelara  every 4 weeks  Follow up in 6-9 months. Please call us  October to see about scheduling an appointment for November or later.   I appreciate the opportunity to care for you. Loy Ruff, MD, [facility]

## 2024-03-29 ENCOUNTER — Encounter: Payer: Self-pay | Admitting: Internal Medicine

## 2024-03-29 MED ORDER — USTEKINUMAB 90 MG/ML ~~LOC~~ SOSY: 90.0000 mg | PREFILLED_SYRINGE | SUBCUTANEOUS | 5 refills | Status: DC

## 2024-03-30 ENCOUNTER — Telehealth: Payer: Self-pay | Admitting: Pharmacy Technician

## 2024-03-30 ENCOUNTER — Telehealth: Payer: Self-pay

## 2024-03-30 ENCOUNTER — Other Ambulatory Visit (HOSPITAL_COMMUNITY): Payer: Self-pay

## 2024-03-30 NOTE — Telephone Encounter (Signed)
 Pharmacy Patient Advocate Encounter   Received notification from Fax that prior authorization for DALFAMPRIDINE  ER 10MG  is required/requested.   Insurance verification completed.   The patient is insured through [facility] .   Per test claim: PA required; PA submitted to above mentioned insurance via CoverMyMeds Key/confirmation #/EOC [id number] Status is pending

## 2024-03-30 NOTE — Telephone Encounter (Signed)
 Pharmacy Patient Advocate Encounter   Received notification from Physician's Office that prior authorization for Stelara  90MG /ML syringes is required/requested.   Insurance verification completed.   The patient is insured through [facility] .   Per test claim: PA required; PA submitted to above mentioned insurance via CoverMyMeds Key/confirmation #/EOC [id number] Status is pending

## 2024-04-03 ENCOUNTER — Telehealth: Payer: Self-pay | Admitting: Pharmacist

## 2024-04-03 NOTE — Telephone Encounter (Signed)
 Appeal has been submitted for Stelara  once monthly dosing. Will advise when response is received, please be advised that most companies may take 30 days to make a decision. Appeal letter and supporting documentation have been faxed to [phone] on 04/03/2024 @1 :56 pm.  Thank you, Dene Fines, PharmD Clinical Pharmacist  [facility]  Direct Dial: [phone]

## 2024-04-03 NOTE — Telephone Encounter (Signed)
 Approved on May 30 by [facility] 2017 NCPDP Request Reference Number: [id number]. DALFAMPRIDIN TAB 10MG  ER is approved through 04/28/2025. Your patient may now fill this prescription and it will be covered. Effective Date: 04/28/2024 Authorization Expiration Date: 04/28/2025

## 2024-04-03 NOTE — Telephone Encounter (Signed)
 Pharmacy Patient Advocate Encounter  Received notification from [facility] that Prior Authorization for Stelara  90MG /ML syringes has been DENIED.  Full denial letter will be uploaded to the media tab. See denial reason below.  This request was denied because you did not meet the following requirements:  STELARA  INJ 90MG /ML has been previously approved for up to 1 per 56 days until 08-01-2024 or until coverage for the mediation is not longer available under your benefit plan or the mediation becomes subject to a pharmacy benefit coverage requirement, such as supply limits or notification, whichever occurs first as allowed by law. For quantities above the maximum benefit limit, please follow the appeals process  PA #/Case ID/Reference #: [id number]   Information has been sent to clinical pharmacist for appeals review. It may take 5-7 days to prepare the necessary documentation to request the appeal from the insurance.

## 2024-04-05 ENCOUNTER — Other Ambulatory Visit: Payer: Self-pay | Admitting: Neurology

## 2024-04-09 ENCOUNTER — Other Ambulatory Visit (HOSPITAL_COMMUNITY): Payer: Self-pay

## 2024-04-10 NOTE — Telephone Encounter (Signed)
 Nothing as of now. It may take up to 30 days to receive a response and we will update you once we hear back.

## 2024-04-10 NOTE — Telephone Encounter (Signed)
 Do you have a status report on the appeal for Stelara ?

## 2024-04-12 NOTE — Telephone Encounter (Signed)
 Spoke with the insurance today, 04/12/2024, appeal is still open. It is marked with a 04/18/2024 due date.

## 2024-04-19 ENCOUNTER — Other Ambulatory Visit (HOSPITAL_COMMUNITY): Payer: Self-pay

## 2024-04-19 ENCOUNTER — Other Ambulatory Visit: Payer: Self-pay | Admitting: Family Medicine

## 2024-04-19 DIAGNOSIS — F325 Major depressive disorder, single episode, in full remission: Secondary | ICD-10-CM

## 2024-04-23 ENCOUNTER — Other Ambulatory Visit (HOSPITAL_COMMUNITY): Payer: Self-pay

## 2024-05-07 ENCOUNTER — Encounter: Payer: Self-pay | Admitting: Family Medicine

## 2024-05-08 ENCOUNTER — Other Ambulatory Visit (HOSPITAL_COMMUNITY): Payer: Self-pay

## 2024-05-08 MED ORDER — SYRINGE 25G X 1" 3 ML MISC: 0 refills | Status: AC

## 2024-05-08 MED ORDER — BD DISP NEEDLES 25G X 5/8" MISC: 0 refills | Status: AC

## 2024-05-08 MED ORDER — CYANOCOBALAMIN 1000 MCG/ML IJ SOLN: INTRAMUSCULAR | 2 refills | Status: AC

## 2024-05-14 ENCOUNTER — Other Ambulatory Visit (HOSPITAL_COMMUNITY): Payer: Self-pay

## 2024-05-14 ENCOUNTER — Encounter: Payer: Self-pay | Admitting: Hematology

## 2024-05-14 NOTE — Telephone Encounter (Signed)
 Noted pt. advised

## 2024-05-14 NOTE — Telephone Encounter (Signed)
 Per insurance, the appeal for Stelara  has been approved.  Complete letter has been uploaded under patient's media tab.

## 2024-05-21 ENCOUNTER — Encounter: Payer: Self-pay | Admitting: Family Medicine

## 2024-06-10 ENCOUNTER — Encounter: Payer: Self-pay | Admitting: Family Medicine

## 2024-06-10 NOTE — Telephone Encounter (Signed)
 Please enter future order for vitamin D  (dx vitamin D  deficiency) and lipid panel (mixed hyperlipidemia, both also with med monitor) and schedule for lab visit Tuesday per her request.

## 2024-06-11 ENCOUNTER — Other Ambulatory Visit: Payer: Self-pay

## 2024-06-11 DIAGNOSIS — E559 Vitamin D deficiency, unspecified: Secondary | ICD-10-CM

## 2024-06-11 DIAGNOSIS — Z79899 Other long term (current) drug therapy: Secondary | ICD-10-CM

## 2024-06-11 DIAGNOSIS — E782 Mixed hyperlipidemia: Secondary | ICD-10-CM

## 2024-06-11 DIAGNOSIS — Z5181 Encounter for therapeutic drug level monitoring: Secondary | ICD-10-CM

## 2024-06-12 ENCOUNTER — Other Ambulatory Visit

## 2024-06-13 ENCOUNTER — Other Ambulatory Visit

## 2024-06-13 DIAGNOSIS — E559 Vitamin D deficiency, unspecified: Secondary | ICD-10-CM

## 2024-06-13 DIAGNOSIS — E782 Mixed hyperlipidemia: Secondary | ICD-10-CM

## 2024-06-13 DIAGNOSIS — Z79899 Other long term (current) drug therapy: Secondary | ICD-10-CM

## 2024-06-14 ENCOUNTER — Ambulatory Visit: Payer: Self-pay | Admitting: Family Medicine

## 2024-06-14 LAB — LIPID PANEL
Chol/HDL Ratio: 2 ratio (ref 0.0–4.4)
Cholesterol, Total: 106 mg/dL (ref 100–199)
HDL: 54 mg/dL (ref >39)
LDL Chol Calc (NIH): 32 mg/dL (ref 0–99)
Triglycerides: 109 mg/dL (ref 0–149)
VLDL Cholesterol Cal: 20 mg/dL (ref 5–40)

## 2024-06-14 LAB — VITAMIN D 25 HYDROXY (VIT D DEFICIENCY, FRACTURES): Vit D, 25-Hydroxy: 71.4 ng/mL (ref 30.0–100.0)

## 2024-07-27 ENCOUNTER — Other Ambulatory Visit (HOSPITAL_COMMUNITY): Payer: Self-pay

## 2024-07-30 ENCOUNTER — Other Ambulatory Visit (HOSPITAL_COMMUNITY): Payer: Self-pay

## 2024-07-30 ENCOUNTER — Telehealth: Payer: Self-pay

## 2024-07-30 MED ORDER — WEZLANA 90 MG/ML ~~LOC~~ SOSY: 90.0000 ug | PREFILLED_SYRINGE | SUBCUTANEOUS | 12 refills | Status: DC

## 2024-07-30 NOTE — Telephone Encounter (Signed)
 Rosina there is an approval under the media tab until 04/2025. Is that not still correct?

## 2024-07-30 NOTE — Telephone Encounter (Signed)
 I have sent the [facility] to [facility] per Dr Avram order

## 2024-07-30 NOTE — Telephone Encounter (Signed)
 Pharmacy Patient Advocate Encounter   Received notification from Patient Pharmacy that prior authorization for Stelara  90MG /ML syringes is required/requested.   Insurance verification completed.   The patient is insured through [facility] .   Prior Authorization for Stelara  90MG /ML syringes has been DENIED.  Full denial letter will be uploaded to the media tab. See denial reason below.  The request was denied because you did not meet the following requirements:   The requested medication is not covered because it is not on the listing or formulary of approved drugs for your plan benefit. Please discuss alternative drug therapy with your doctory.   The request for coverage for STELARA  INJ 90MG /ML, use as directed (1 per month), is denied. This decision is based on health plan criteria for STELARA  INJ 90MG /ML. This medicine is only covered if:  You have failed or cannot use two of the following:  (1) Wezlana (2) Steqeyma (3) Yesintek  Please note: Formulary alternative Steqeyma and Hansel have been approved through 07-27-2024 and will process at your pharmacy without requiring separate clinical review. Doses/quantities above plan limits and/or maximum Food and Drug Administration (FDA) approved dosing may be subject to further review.  Please note: This product may require prior authorization Please note: This medication cannot be further reviewed for the quantity limit until the above criteria have been addressed.   PA #/Case ID/Reference #: [id number]

## 2024-07-30 NOTE — Telephone Encounter (Signed)
 Stelara  is now a non-formulary medication. I was trying to get it approved again but it looks like they require the patient to use bio-similars first.

## 2024-07-30 NOTE — Telephone Encounter (Signed)
.  Pharmacy Patient Advocate Encounter   Received notification from Physician's Office that prior authorization for Wezlana 90MG /ML syringes is required/requested.   Insurance verification completed.   The patient is insured through [facility] .   Per test claim: PA required; PA submitted to above mentioned insurance via Latent Key/confirmation #/EOC [id number] Status is pending

## 2024-07-30 NOTE — Telephone Encounter (Signed)
 I recall receiving a form to use Wezlana (biosimilar ustekinumab ) and signing that and leaving it to be faxed in my office area.  At any rate would switch to Wezlana 90 micrograms every 30 days with refills for 1 year

## 2024-07-30 NOTE — Telephone Encounter (Signed)
 Dr Avram see the message from [place] and advise

## 2024-07-31 NOTE — Telephone Encounter (Signed)
 Pharmacy Patient Advocate Encounter  Additional information has been requested from the patient's insurance in order to proceed with the prior authorization request. Requested information has been sent, or form has been filled out and faxed back to [facility]

## 2024-08-01 ENCOUNTER — Other Ambulatory Visit (HOSPITAL_COMMUNITY): Payer: Self-pay

## 2024-08-01 ENCOUNTER — Other Ambulatory Visit: Payer: Self-pay | Admitting: Internal Medicine

## 2024-08-01 ENCOUNTER — Telehealth: Payer: Self-pay | Admitting: Internal Medicine

## 2024-08-01 ENCOUNTER — Encounter: Payer: Self-pay | Admitting: Internal Medicine

## 2024-08-01 MED ORDER — STEQEYMA 90 MG/ML ~~LOC~~ SOSY: 90.0000 mg | PREFILLED_SYRINGE | SUBCUTANEOUS | 11 refills | Status: AC

## 2024-08-01 NOTE — Telephone Encounter (Signed)
 Noted letter has been sent to Dr Avram

## 2024-08-01 NOTE — Telephone Encounter (Signed)
 FYI I sent in Steqeyma rx [facility] to replace Stelara  as required by her insurance

## 2024-08-01 NOTE — Telephone Encounter (Signed)
 Dr Avram see message regarding Wezlana

## 2024-08-01 NOTE — Telephone Encounter (Signed)
 Pharmacy Patient Advocate Encounter  Received notification from [facility] that Prior Authorization for Wezlana 90MG /ML syringes has been DENIED.  Full denial letter will be uploaded to the media tab. See denial reason below.  This request was denied because you did not meet the following requirements:   The requested medication is not covered because it is not on the listing or formulary of approved drugs for your plan benefit. Please discuss alternative drug therapy with your doctor.   The request for coverage for WEZLANA INJ 90MG /ML, use as directed (1 per month), is denied. This decision is based on health plan criteria for [facility] INJ 90MG /ML. This medicine is covered only if:  (1)  One of the following:  (A) You have been established on therapy with the requested ustekinumab  product under an active [employer] medical benefit prior authorization for treatment of moderately to severely active Crohn's disease.  (B) Both of the following:  (I) You are currently on the requested ustekinumab  product for moderately to severely active Crohn's disease as documented by claims history or medical records (document drug, date, and duration of therapy) (II) You have not received a manufacturer supplied sample at no cost in the prescriber's office, or any form of assistance from manufacturer sponsored program (for example: sample card which can be redeemed at a pharmacy for a free supply of medication) as a means to establish as a current user of the requested ustekinumab  product (2) You have a history of failure, contraindication or intolerance to all alternative treatment options: Judi and Hansel  PA #/Case ID/Reference #: [id number]

## 2024-08-02 ENCOUNTER — Other Ambulatory Visit: Payer: Self-pay

## 2024-08-02 ENCOUNTER — Other Ambulatory Visit (HOSPITAL_COMMUNITY): Payer: Self-pay

## 2024-08-02 ENCOUNTER — Telehealth: Payer: Self-pay

## 2024-08-02 NOTE — Telephone Encounter (Signed)
 Pharmacy Patient Advocate Encounter  Received notification from [facility] that Prior Authorization for Steqeyma 90MG /ML syringes has been APPROVED from 08-02-2024 to 08-02-2025   PA #/Case ID/Reference #: [id number]

## 2024-08-02 NOTE — Telephone Encounter (Signed)
 Pharmacy Patient Advocate Encounter   Received notification from Pt Calls Messages that prior authorization for Steqeyma 90MG /ML syringes is required/requested.   Insurance verification completed.   The patient is insured through [facility].   Per test claim: PA required; PA submitted to above mentioned insurance via Latent Key/confirmation #/EOC [id number] Status is pending

## 2024-08-16 ENCOUNTER — Encounter: Payer: Self-pay | Admitting: Neurology

## 2024-08-16 DIAGNOSIS — G35D Multiple sclerosis, unspecified: Secondary | ICD-10-CM

## 2024-08-27 ENCOUNTER — Ambulatory Visit: Admitting: Neurology

## 2024-08-27 ENCOUNTER — Telehealth: Payer: Self-pay | Admitting: Internal Medicine

## 2024-08-27 NOTE — Telephone Encounter (Signed)
 Are you able to look into this please. Lindsay Mccarthy was denied by the patient's insurance previously. Lindsay Mccarthy was approved for a year.

## 2024-08-27 NOTE — Telephone Encounter (Signed)
 Inbound call from [facility] rep stating that the patient needs to have her prescription for Steqeyma 90 MG/ ML changed to Wezlana in the future in order for this patient to continue with refills. Please advise.

## 2024-08-28 ENCOUNTER — Other Ambulatory Visit (HOSPITAL_COMMUNITY): Payer: Self-pay

## 2024-08-28 NOTE — Telephone Encounter (Signed)
 I'm surprised they did not explain anything further in the call. Do you happen to know if this was the pharmacy that called or the insurance?   There could be two reasons I can think of and that is there is a formulary change for her insurance or there is a new policy in place that involves them being able to dispense the medication or not. I know the same happened with [facility] and they were no longer able to dispense certain medications so patient had to transfer care to another pharmacy.

## 2024-08-28 NOTE — Telephone Encounter (Signed)
 If [facility] calls again, please get me a telephone number and I will call them back.

## 2024-08-29 ENCOUNTER — Ambulatory Visit
Admission: RE | Admit: 2024-08-29 | Discharge: 2024-08-29 | Disposition: A | Discharge status: Home / Self Care | Source: Ambulatory Visit | Attending: Neurology | Admitting: Neurology

## 2024-08-29 DIAGNOSIS — G35D Multiple sclerosis, unspecified: Secondary | ICD-10-CM

## 2024-08-29 MED ORDER — GADOPICLENOL 0.5 MMOL/ML IV SOLN
8.5000 mL | Freq: Once | INTRAVENOUS | Status: AC | PRN
  Administered 2024-08-29: 8.5 mL via INTRAVENOUS

## 2024-08-31 ENCOUNTER — Encounter: Payer: Self-pay | Admitting: Hematology

## 2024-08-31 ENCOUNTER — Ambulatory Visit
Admission: RE | Admit: 2024-08-31 | Discharge: 2024-08-31 | Disposition: A | Discharge status: Home / Self Care | Source: Ambulatory Visit | Attending: Neurology | Admitting: Neurology

## 2024-08-31 DIAGNOSIS — G35D Multiple sclerosis, unspecified: Secondary | ICD-10-CM

## 2024-08-31 MED ORDER — GADOPICLENOL 0.5 MMOL/ML IV SOLN
9.0000 mL | Freq: Once | INTRAVENOUS | Status: AC | PRN
  Administered 2024-08-31: 9 mL via INTRAVENOUS

## 2024-09-02 ENCOUNTER — Other Ambulatory Visit: Payer: Self-pay | Admitting: Neurology

## 2024-09-05 NOTE — Progress Notes (Unsigned)
 NEUROLOGY FOLLOW UP OFFICE NOTE  Lindsay Mccarthy [id number]  Assessment/Plan:   Multiple sclerosis - due to recent surgery and changes in medication to treat her Crohn's, will defer starting a new DMT for now.  She will be on immunosuppressant therapy for Crohn's anyway.  Compressive cervical myelopathy s/p C4-C5 arthroplasty.  Still with moderate cervical spinal stenosis at C5-6 being monitored by spine surgery Bilateral arm/hand numbness - may be radiculopathy vs carpal tunnel syndrome   DMT: Defer.  Instead, taking Steqeyma for Crohn's disease D3 5000 IU daily, gabapentin  100-200mg  QHS, Ampyra , baclofen  10mg  TID PRN MRI of brain, cervical and thoracic spine with and without contrast in 6 months Follow up with spine surgeon as scheduled. Follow up with me in 6 months.   Subjective:  Lindsay Mccarthy is a 48 year old right-handed woman with Crohn's disease, depression, anxiety and history of DVT who follows up for multiple sclerosis.    UPDATE: Current disease modifying therapy: None.  Lindsay Mccarthy for Crohn's Other medication:  D3 5,000 IU daily (decreased by PCP from 10,000 as level was 71.4 and she has lost weight), B12 injection, Ampyra , gabapentin  200mg  at bedtime, baclofen  10-20mg  at bedtime, Wellbutrin , Lexapro  10mg , ,Colestid , Bentayl, Eliquis , Mirena   MRI of brain/cervical/thoracic spine with and without contrast on 08/31/2024 were stable.    03/09/2024 LABS:  CBC with WBC 7.2, HGB 13.9, HCT 41.9, PLT 372; Quantiferon-TB Gold Plus negative 06/13/2024 LABS:  Vit D 71.4  Vision:  No issues Motor:  No new issues Sensory:  as above Pain:  Spasms in legs.  Treats with baclofen  Gait:  On Ampyra .  Now on Zepbound which is slowing her GI system and Ampyra  seems more effective.  She has a support dog to help guide. Bowel/Bladder:  Occasional urinary incontinence when stressed.  Crohn's disease.  Has ostomy Fatigue:  Some fatigue. Cognition:  Trouble multi-tasking.    Mood:  Stable   HISTORY: She reports history of various neurologic symptoms since her late 48s: -  Urinary incontinence:  It started about a year ago and has gotten worse.  It occurs suddenly, once or twice a month, usually at home and while standing, such as while washing dishes with the water running.  It occurs during fatigued or emotional stress as well.  She denies increased urinary frequency or dysuria.  No perineal numbness.  No low back pain.   - She reports poor night vision with difficulty seeing while driving at night. She has trouble focusing.  Recent eye exam showed poor refocusing, which would normally be seen in an older adult.  Her sister had Fuchs dystrophy but she doesn't have it. -  She reports numbness and tingling in her hands when she wakes up in the morning or in the evening after work. -  She reports abnormal gait.  It feels like her feet are stuck in cement.  She has trouble lifting her feet.  It causes hip pain.  She trips over her feet.  It is worse when there is an extreme change in temperature.  She started having trouble in her mid-30s.  Worse over the past 2 years.  She reports a stabbing pain in her hips or knees.  She reports previously being diagnosed with a pinched nerve in the back that caused a left foot drop in the 20s. She had PT and injections which was ineffective.  No numbness in the legs.   -  When she throws a ball, her hand doesn't release immediately.  There  is a delay until she is able to release the ball.  She has similar problems with her left hand.  No neck pain. -  She reports fatigued.  She needs to take a nap during the day.  Prior sleep study was normal. - Trouble multitasking.  She underwent neuropsychological testing on 09/04/2019.  Performance fell largely within normal limits.  She did exhibit a weakness across aspects of attention/concentration and some performance variability across processing speed.  Performance involving complex attention,  executive functioning, receptive and expressive language, visual-spatial abilities and verbal and visual learning and memory were within normal range.  In addition to cord involvement of MS, she also has severe cervical spinal stenosis.  In November 2023, she saw neurosurgery at [facility] regarding cervical spinal stenosis causing compressive myelopathy.  As it is uncertain how much symptoms are related to MS or the C4-5 stenosis, surgery was deferred.  She underwent epidural injections which did helped with arm numbness and pain but not with balance and not effective enough that she wanted to return for repeat injections.  She followed up with neurosurgery at [facility] and underwent C4-C5 arthroplasty on 12/12/2023.  She overall feels better after the surgery.  She thinks that her balance and strength has improved.  However, it will take time before it is determined how much of her symptoms were related to the spinal stenosis or the MS.     One of her two sisters has multiple sclerosis.   Other history:  She has Crohn's disease.  Earlier this year, she developed a DVT in her right leg.  She also was found to have B12 deficiency and is being treated with injections.   01/09/18 LABS:  B12 143, Sed rate 18.  Hypercoagulable panel unremarkable, including lupus anticoagulant panel, dRVVT mix, beta-2 -glycoprotein, cardiolipin antibodies, prothrombin gene mutation, factor V Leiden.  NMO/AQP4+ antibody was negative.  Her insurance would not cover anti-MOG antibody testing.   09/2018 LABS:  JC Virus antibody was positive with index of 3.26.  Quantiferon-TB Gold Plus negative.  She tested negative for Hep B.      Imaging: 07/22/2018:  MRI C & T-SPINE W WO:  Multiple T2/STIR hyperintense signal within the spinal cord at C2, C3-C4, C6-C7, T1, T3-T4, T6-T7, and T12 levels, all non-enhancing.  She also demonstrates degenerative spinal stenosis at C4-C5 and C5-C6 with mild spinal cord mass  effect. 07/24/2018:  MRI BRAIN W WO:  Mild hyperintense in the periventricular and juxtacortical white matter with signal running perpendicular from the lateral ventricle and involving the corpus callosum, non-enhancing. 04/05/2020 MRI C & T-SPINE W WO:  Chronic patchy multifocal cord signal abnormality throughout cervical and thoracic cord, overall stable compared to 2019.  No active lesions.  Degenerative cervical spondylosis at C4-5 and C5-6 with mild to moderate spinal stenosis and moderate left C5 foraminal narrowing. 05/02/2020 MRI BRAIN W WO:  Unchanged mild subcortical/juxtacortical and deep periventricular white matter foci of signal abnormality consistent with the provided history of multiple sclerosis. No new or enhancing lesion is identified. 07/07/2021 MRI BRAIN W WO:  Similar mild T2 white matter hyperintensities, compatible with the reported history of multiple sclerosis. No convincing new or enhancing lesions identified. 07/19/2021 MRI C-SPINE W WO:  1. Multiple T2 hyperintense cord lesions consistent with the clinical diagnosis of multiple sclerosis. No new or enhancing lesion identified.  2. Degenerative changes at C4-5 with mild progression of spinal canal stenosis which is moderate, with mass effect on the cord.  There is also mild-to-moderate right and moderate left neural foraminal narrowing at this level. 07/24/2022 MRI BRAIN W WO:  1. Unchanged mild T2 hyperintense foci. No new or enhancing lesions to suggest progressive or active demyelination.  2. No acute intracranial process. 07/17/2022 MRI C & T-SPINE W WO:  1. Redemonstrated scattered T2 hyperintense lesions within the cervical and thoracic spinal cord, not significantly changed from prior exam, and compatible with demyelinating disease. No evidence of contrast enhancement to suggest active demyelination.  2. Compared to prior exam there is new T2 hyperintense signal within the central spinal cord a the C4-C5 and T6-T7 levels  where there is moderate to severe spinal canal stenosis. This is favored to represent changes related to chronic compressive myelopathy. 08/27/2023 MRI BRAIN W WO:  Mild cerebral white matter disease, unchanged from the prior brain MRI of 07/24/2022 and compatible with the provided history of multiple sclerosis. No pathologic enhancement to suggest active demyelination. 08/27/2023 MRI C-SPINE W WO:  1. Chronic T2 hyperintense lesions within the cervical spinal cord, as described and unchanged from the prior MRI of 07/17/2022. Findings are compatible with the provided history of multiple sclerosis. No pathologic enhancement to suggest active demyelination. 2. Cervical spondylosis as outlined and not significantly changed from the prior MRI. Findings are most notably as follows. 3. At C4-C5, there is moderate disc degeneration with mild degenerative endplate edema and enhancement. Multifactorial severe spinal canal stenosis with spinal cord flattening. Subtle T2 hyperintense signal abnormality within the spinal cord at this level, which was better appreciated on the prior MRI, and may reflect myelomalacia and/or focal edema. Bilateral neural foraminal narrowing (moderate/severe right, severe left). 4. At C5-C6, there is moderate disc degeneration. A disc bulge and central disc protrusion effaces the ventral thecal sac and mildly flattens the ventral aspect of the spinal cord. However, the dorsal CSF space is maintained within the spinal canal. Left-sided uncovertebral hypertrophy contributes to moderate left neural foraminal narrowing. 08/27/2023 MRI T-SPINE W WO:  1. Known T2 hyperintense lesions within the thoracic spinal cord at the T1-T2, T3-T4, T9-T10 and T12 levels, some of which were better appreciated on the prior MRI of 07/17/2022. Findings are compatible with the provided history of multiple sclerosis. 2. T2 hyperintense signal abnormality also again noted within the central and right aspect of the  spinal cord at the T6-T7 level. A central disc protrusion effaces the ventral thecal sac and mildly flattens the ventral aspect of the spinal cord at this level. Thus, this focus of cord signal abnormality could reflect a chronic demyelinating lesion and/or compressive myelopathy. 3. Background thoracic spondylosis, as outlined and unchanged. No more than mild spinal canal narrowing at the remaining thoracic levels. No significant foraminal stenosis. 08/31/2024 MRI BRAIN W WO:  1. Stable punctate foci of increased T2 signal in the periventricular and subcortical white matter, without new or enhancing lesions. 08/31/2024 MRI C-SPINE W WO:  1. Stable demyelinating lesions at C2, C3, and C6-7 levels, with no new lesions or abnormal contrast enhancement. 2. Chronic degenerative disc disease at C5-6 with moderate central spinal canal stenosis due to posterior disc osteophyte complex. 3. Mild central spinal canal stenosis at C6-7 secondary to degenerative disc bulging. 08/31/2024 MRI T-SPINE W WO:  1. Stable T2 hyperintense cord lesions at T1-2 (left), T3-4 (anterolateral left), T6-7 (right lateral and central), and T12, compatible with multiple sclerosis. No new or enhancing lesions. The previously described dorsal T9-10 lesion is not clearly demonstrated on the current exam. 2. Chronic degenerative disc  disease at T6-7 with posterior disc bulge causing mild central canal stenosis; remaining thoracic disc spaces are preserved.   Past DMT:  Ocrevus  (effective but would develop recurrence of chronic symptoms prior to next infusion), Kesimpta  (possibly caused Crohn's flare)  Past medications:  gabapentin    PAST MEDICAL HISTORY: Past Medical History:  Diagnosis Date   Anemia    related to Crohns flares   Arm DVT (deep venous thromboembolism), acute, right (HCC) 11/29/2017   Arthritis    knees, feet, hands, wrists, related to Crohns flare   Asthma    Childhood   B12 deficiency    monitored/treated by Dr.  Avram   Cervical dysplasia    Clotting disorder    on eliquis  hx dvt/ pulmonary embolism   COVID 04/13/2021   Crohn disease (HCC)    Crohn's disease of small intestine (HCC) 06/24/2012   Diagnosed 1999, in [place] . Ileitis only then. Treated with Imuran  Remicade and prednisone.  Noncompliant with therapy. 2004 return to care and was treated with Remicade prednisone Pentasa Cipro and Flagyl. 2006 status post right hemicolectomy. Subsequently treated with azathioprine  and Cimzia . 200 mg every other week.  azathioprine  was added in 2010. Prometheus TP MT enzyme was negative.   Depression    DVT of upper extremity (deep vein thrombosis) (HCC) 11/28/2017   Eczema    arms and behind knees, worse in winter   Generalized anxiety disorder    History of recurrent UTI (urinary tract infection)    HPV (human papilloma virus) infection    Hyperlipemia    Hypertriglyceridemia 07/03/2012   10/2010 labs showed level of 753 with total cholesterol 180 and HDL 41' Labs 05/2013--TG 180    Major depressive disorder in remission 10/03/2013   MS (multiple sclerosis)    Multiple sclerosis 2019   Officially diagnosed in the Fall   Neuromuscular disorder (HCC)    Osteopenia 09/29/2011   T-1.6   Papanicolaou smear 10/2011   last abnormal 2011   Pulmonary embolism (HCC) 11/30/2017   Scaphoid fracture of wrist 2013   left   Seasonal allergic rhinitis    Shingles 09/2015   R hip   Vitamin B12 deficiency 06/19/2012   Vitamin D  deficiency 12/16/2015   Vitamin D -OH level 12     MEDICATIONS: Current Outpatient Medications on File Prior to Visit  Medication Sig Dispense Refill   acetaminophen  (TYLENOL ) 500 MG tablet Take 1,000 mg by mouth every 6 (six) hours as needed for headache (pain). Reported on 12/15/2015     baclofen  (LIORESAL ) 10 MG tablet TAKE 1 TABLET(10 MG) BY MOUTH THREE TIMES DAILY AS NEEDED FOR MUSCLE SPASMS 90 tablet 5   buPROPion  (WELLBUTRIN ) 100 MG tablet TAKE 1 TABLET(100 MG) BY MOUTH  TWICE DAILY 180 tablet 2   cetirizine (ZYRTEC) 10 MG tablet Take 10 mg by mouth at bedtime.      cyanocobalamin  (DODEX ) 1000 MCG/ML injection INJECT 1 ML INTO THE SKIN EVERY 14 DAYS 10 mL 4   cyanocobalamin  (VITAMIN B12) 1000 MCG/ML injection Inject 1ml every 2 weeks 10 mL 2   dalfampridine  10 MG TB12 TAKE 1 TABLET BY MOUTH EVERY 12  HOURS 60 tablet 5   diphenoxylate -atropine  (LOMOTIL ) 2.5-0.025 MG tablet Take 1 to 2 tablets by mouth every 6 to 8 hours as needed for diarrhea 30 tablet 1   ELIQUIS  5 MG TABS tablet TAKE 1 TABLET BY MOUTH TWICE  DAILY 180 tablet 3   escitalopram  (LEXAPRO ) 10 MG tablet TAKE 1 TABLET BY MOUTH AT  BEDTIME 90 tablet 3   fenofibrate  160 MG tablet TAKE 1 TABLET BY MOUTH AT  BEDTIME 90 tablet 3   ferrous sulfate  325 (65 FE) MG EC tablet Take 325 mg by mouth daily.     gabapentin  (NEURONTIN ) 100 MG capsule TAKE 3 CAPSULES BY MOUTH AT  BEDTIME 270 capsule 3   Insulin  Pen Needle 33G X 5 MM MISC 1 each by Does not apply route every 14 (fourteen) days. 100 each 2   NEEDLE, DISP, 25 G (BD DISP NEEDLES) 25G X 5/8 MISC Use with vitamin B12 injection 100 each 0   NON FORMULARY Take 1 each by mouth at bedtime. CBD/ cannabis     rosuvastatin  (CRESTOR ) 5 MG tablet Take 1 tablet (5 mg total) by mouth daily. 90 tablet 3   Syringe/Needle, Disp, (SYRINGE 3CC/25GX1) 25G X 1 3 ML MISC Use for Vitamin b12 injection 100 each 0   tirzepatide 5 MG/0.5ML injection vial Inject into the skin once a week.     Ustekinumab -stba (STEQEYMA) 90 MG/ML SOSY Inject 90 mg into the skin every 30 (thirty) days. 1 mL 11   VITAMIN D  PO Take 5,000 Int'l Units by mouth in the morning and at bedtime.     No current facility-administered medications on file prior to visit.    ALLERGIES: Allergies  Allergen Reactions   Iron  Other (See Comments)    Blood in stool/Rectal Bleeding   Iron  Sucrose Other (See Comments)    Fever, infusion reaction  Flu like symptoms   Ibuprofen Other (See Comments)    Avoids  due to Crohns disease   Morphine  And Codeine Itching    itchy   Ofatumumab  Other (See Comments)    Crohn's flare   Topamax [Topiramate] Other (See Comments)    Headache (only took x 3d)   Adhesive [Tape] Rash    Rash with electrodes   Prednisone Other (See Comments) and Anxiety    crazy, hallucinations Tolerates methylprednisolone  hallucinations    FAMILY HISTORY: Family History  Problem Relation Age of Onset   Hypertension Mother    Breast cancer Mother 65   Bone cancer Mother        metastatic from breast   Depression Mother    Hypothyroidism Mother    Colon polyps Father    Diabetes Father        borderline--resolved 2020   Hypertension Father        off meds now   Hyperlipidemia Father        off meds now   Fuch's dystrophy Father    Multiple sclerosis Sister        affecting brain only   Alcohol abuse Sister    Fuch's dystrophy Sister    Hypertension Sister    Heart disease Sister        poss MI in 106's   Stroke Maternal Grandmother    Lung cancer Maternal Grandmother    Hypertension Maternal Grandmother    Heart disease Paternal Grandmother    Hypertension Paternal Grandmother    Colon cancer Paternal Grandfather    Stomach cancer Neg Hx    Esophageal cancer Neg Hx    Pancreatic cancer Neg Hx       Objective:  Blood pressure 128/81, pulse 90, height 5' 4 (1.626 m), weight 186 lb (84.4 kg), SpO2 96%. General: No acute distress.  Patient appears well-groomed.   Head:  Normocephalic/atraumatic Eyes:  Fundi examined but not visualized Neck: supple, no paraspinal tenderness, full range of  motion Heart:  Regular rate and rhythm Back: No paraspinal tenderness Neurological Exam: alert and oriented.  Speech fluent and not dysarthric, language intact.  CN II-XII intact. Increased tone in lower extremities.  Muscle strength 5-/5 right hip flexion, otherwise 5/5 throughout.  Sensation to pinprick intact.  Vibratory sensation reduced in toes of right foot.  Deep  tendon reflexes 3+ right patellar, otherwise, 2+ throughout.  Bilateral Babinski sign present.  Finger to nose intact.  Spastic gait.  Ambulates with her guide dog.     Juliene Dunnings, DO  CC: Annabelle Fetters, MD

## 2024-09-06 ENCOUNTER — Ambulatory Visit (INDEPENDENT_AMBULATORY_CARE_PROVIDER_SITE_OTHER): Admitting: Neurology

## 2024-09-06 ENCOUNTER — Encounter: Payer: Self-pay | Admitting: Neurology

## 2024-09-06 DIAGNOSIS — G35D Multiple sclerosis, unspecified: Secondary | ICD-10-CM | POA: Diagnosis not present

## 2024-09-06 DIAGNOSIS — M62838 Other muscle spasm: Secondary | ICD-10-CM | POA: Diagnosis not present

## 2024-09-06 MED ORDER — DALFAMPRIDINE ER 10 MG PO TB12: 10.0000 mg | ORAL_TABLET | Freq: Two times a day (BID) | ORAL | 5 refills | Status: AC

## 2024-09-06 MED ORDER — BACLOFEN 10 MG PO TABS: ORAL_TABLET | ORAL | 5 refills | Status: AC

## 2024-09-06 MED ORDER — GABAPENTIN 100 MG PO CAPS: 300.0000 mg | ORAL_CAPSULE | Freq: Every day | ORAL | 3 refills | Status: AC

## 2024-09-06 NOTE — Patient Instructions (Signed)
 Baclofen   Gabapentin  Ampyra 

## 2024-09-13 ENCOUNTER — Encounter: Payer: Self-pay | Admitting: Internal Medicine

## 2024-10-17 ENCOUNTER — Ambulatory Visit: Admitting: Obstetrics and Gynecology

## 2024-10-17 ENCOUNTER — Encounter: Payer: Self-pay | Admitting: Obstetrics and Gynecology

## 2024-10-17 VITALS — BP 102/64 | HR 89 | Wt 179.0 lb

## 2024-10-17 DIAGNOSIS — Z308 Encounter for other contraceptive management: Secondary | ICD-10-CM

## 2024-10-17 NOTE — Progress Notes (Unsigned)
 GYNECOLOGY  VISIT   HPI: 48 y.o.   Married  Caucasian female   [id number] with Patient's last menstrual period was 10/06/2024 (exact date).   here for: Discuss nexplanon   Had disc replacement in her next this year.   Tired on monthly menstruation.   Can show up at random times.   Can last up to two weeks, once in a while.       GYNECOLOGIC HISTORY: Patient's last menstrual period was 10/06/2024 (exact date). Contraception:  Condoms  Menopausal hormone therapy:  n/a Last 2 paps:  06/22/22 neg, HR HPV neg, 04/27/19 neg HPV neg  History of abnormal Pap or positive HPV:  no Mammogram:  12/27/23 BIRADS Cat 1 neg         OB History     Gravida  0   Para  0   Term  0   Preterm  0   AB  0   Living  0      SAB  0   IAB  0   Ectopic  0   Multiple  0   Live Births                 Patient Active Problem List   Diagnosis Date Noted   Encounter for contraceptive management 02/04/2024   Class 1 obesity due to excess calories with body mass index (BMI) of 34.0 to 34.9 in adult 02/04/2024   Ileostomy in place [facility]) 06/22/2023   Crohn's disease of colon with intestinal obstruction (HCC) 11/12/2021   History of DVT (deep vein thrombosis) 11/12/2021   Iron  deficiency anemia 08/22/2020   Multiple falls 08/21/2019   Hematoma of right flank 08/21/2019   Anticoagulant long-term use 07/17/2019   Multiple sclerosis 12/19/2018   Pulmonary nodule 07/19/2018   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   Dvt femoral (deep venous thrombosis) (HCC) 11/30/2017   Arm DVT (deep venous thromboembolism), acute, right (HCC) 11/29/2017   Pulmonary embolus (HCC) 11/27/2017   Iron  deficiency anemia due to chronic blood loss 10/05/2017   Heartburn 10/05/2017   Other fatigue 10/05/2017   Vitamin D  deficiency 12/16/2015   Major depressive disorder in remission 10/03/2013   Hypertriglyceridemia 07/03/2012   Crohn's disease of small intestine (HCC) 06/24/2012   Long-term use of  immunosuppressant medication Entyvio  + ocrelizumab  06/24/2012   Vitamin B12 deficiency 06/19/2012   Osteopenia of femur neck T. score -1.4 09/29/2011    Past Medical History:  Diagnosis Date   Anemia    related to Crohns flares   Arm DVT (deep venous thromboembolism), acute, right (HCC) 11/29/2017   Arthritis    knees, feet, hands, wrists, related to Crohns flare   Asthma    Childhood   B12 deficiency    monitored/treated by Dr. Avram   Cervical dysplasia    Clotting disorder    on eliquis  hx dvt/ pulmonary embolism   COVID 04/13/2021   Crohn disease (HCC)    Crohn's disease of small intestine (HCC) 06/24/2012   Diagnosed 1999, in [place] . Ileitis only then. Treated with Imuran  Remicade and prednisone.  Noncompliant with therapy. 2004 return to care and was treated with Remicade prednisone Pentasa Cipro and Flagyl. 2006 status post right hemicolectomy. Subsequently treated with azathioprine  and Cimzia . 200 mg every other week.  azathioprine  was added in 2010. Prometheus TP MT enzyme was negative.   Depression    DVT of upper extremity (deep vein thrombosis) (HCC) 11/28/2017   Eczema    arms and behind knees, worse  in winter   Generalized anxiety disorder    History of recurrent UTI (urinary tract infection)    HPV (human papilloma virus) infection    Hyperlipemia    Hypertriglyceridemia 07/03/2012   10/2010 labs showed level of 753 with total cholesterol 180 and HDL 41' Labs 05/2013--TG 180    Major depressive disorder in remission 10/03/2013   MS (multiple sclerosis)    Multiple sclerosis 2019   Officially diagnosed in the Fall   Neuromuscular disorder (HCC)    Osteopenia 09/29/2011   T-1.6   Papanicolaou smear 10/2011   last abnormal 2011   Pulmonary embolism (HCC) 11/30/2017   Scaphoid fracture of wrist 2013   left   Seasonal allergic rhinitis    Shingles 09/2015   R hip   Vitamin B12 deficiency 06/19/2012   Vitamin D  deficiency 12/16/2015   Vitamin D -OH level  12     Past Surgical History:  Procedure Laterality Date   APPENDECTOMY     CERVICAL BIOPSY  W/ LOOP ELECTRODE EXCISION  11/02/2007   ---paps normal since   CERVICAL DISC ARTHROPLASTY  12/12/2023   C4-C5, Dr. Vonna ([facility])   COLONOSCOPY  multiple   scanned   ESOPHAGOGASTRODUODENOSCOPY  multiple   scanned   HEMICOLECTOMY  11/01/2004   ILEOCECETOMY  12/11/2021   and end ileostomy; at [facility]   IR ANGIOGRAM PULMONARY BILATERAL SELECTIVE  12/03/2017   IR ANGIOGRAM SELECTIVE EACH ADDITIONAL VESSEL  12/03/2017   IR ANGIOGRAM SELECTIVE EACH ADDITIONAL VESSEL  12/03/2017   IR INFUSION THROMBOL ARTERIAL INITIAL (MS)  12/03/2017   IR INFUSION THROMBOL ARTERIAL INITIAL (MS)  12/03/2017   IR IVC FILTER PLMT / S&I /IMG GUID/MOD SED  12/04/2017   IR THROMB F/U EVAL ART/VEN FINAL DAY (MS)  12/04/2017   IR US  GUIDE VASC ACCESS RIGHT  12/03/2017   IVC FILTER REMOVAL N/A 07/11/2018   Procedure: IVC FILTER REMOVAL;  Surgeon: Serene Gaile ORN, MD;  Location: [facility];  Service: Cardiovascular;  Laterality: N/A;   IVC VENOGRAPHY N/A 07/11/2018   Procedure: IVC Venography;  Surgeon: Serene Gaile ORN, MD;  Location: [facility];  Service: Cardiovascular;  Laterality: N/A;   LEEP  11/01/2009   --done in [place]    PERIPHERAL VASCULAR BALLOON ANGIOPLASTY Right 11/30/2017   Procedure: PERIPHERAL VASCULAR BALLOON ANGIOPLASTY;  Surgeon: Serene Gaile ORN, MD;  Location: [facility];  Service: Cardiovascular;  Laterality: Right;   PERIPHERAL VASCULAR THROMBECTOMY N/A 11/29/2017   Procedure: PERIPHERAL VASCULAR THROMBECTOMY - THROMBOLYSIS;  Surgeon: Serene Gaile ORN, MD;  Location: [facility];  Service: Cardiovascular;  Laterality: N/A;  LYSIS CATHETER PLACEMENT ONLY   PERIPHERAL VASCULAR THROMBECTOMY N/A 11/30/2017   Procedure: PERIPHERAL VASCULAR THROMBECTOMY - Lysis Recheck;  Surgeon: Serene Gaile ORN, MD;  Location: [facility];  Service: Cardiovascular;  Laterality:  N/A;   WISDOM TOOTH EXTRACTION      Current Outpatient Medications  Medication Sig Dispense Refill   acetaminophen  (TYLENOL ) 500 MG tablet Take 1,000 mg by mouth every 6 (six) hours as needed for headache (pain). Reported on 12/15/2015     baclofen  (LIORESAL ) 10 MG tablet TAKE 1 TABLET(10 MG) BY MOUTH THREE TIMES DAILY AS NEEDED FOR MUSCLE SPASMS 90 tablet 5   buPROPion  (WELLBUTRIN ) 100 MG tablet TAKE 1 TABLET(100 MG) BY MOUTH TWICE DAILY 180 tablet 2   cetirizine (ZYRTEC) 10 MG tablet Take 10 mg by mouth at bedtime.      cyanocobalamin  (DODEX ) 1000 MCG/ML injection INJECT  1 ML INTO THE SKIN EVERY 14 DAYS 10 mL 4   cyanocobalamin  (VITAMIN B12) 1000 MCG/ML injection Inject 1ml every 2 weeks 10 mL 2   dalfampridine  10 MG TB12 Take 1 tablet (10 mg total) by mouth every 12 (twelve) hours. 60 tablet 5   diphenoxylate -atropine  (LOMOTIL ) 2.5-0.025 MG tablet Take 1 to 2 tablets by mouth every 6 to 8 hours as needed for diarrhea 30 tablet 1   ELIQUIS  5 MG TABS tablet TAKE 1 TABLET BY MOUTH TWICE  DAILY 180 tablet 3   escitalopram  (LEXAPRO ) 10 MG tablet TAKE 1 TABLET BY MOUTH AT  BEDTIME 90 tablet 3   fenofibrate  160 MG tablet TAKE 1 TABLET BY MOUTH AT  BEDTIME 90 tablet 3   ferrous sulfate  325 (65 FE) MG EC tablet Take 325 mg by mouth daily.     gabapentin  (NEURONTIN ) 100 MG capsule Take 3 capsules (300 mg total) by mouth at bedtime. 270 capsule 3   Insulin  Pen Needle 33G X 5 MM MISC 1 each by Does not apply route every 14 (fourteen) days. 100 each 2   NEEDLE, DISP, 25 G (BD DISP NEEDLES) 25G X 5/8 MISC Use with vitamin B12 injection 100 each 0   NON FORMULARY Take 1 each by mouth at bedtime. CBD/ cannabis     rosuvastatin  (CRESTOR ) 5 MG tablet Take 1 tablet (5 mg total) by mouth daily. 90 tablet 3   Syringe/Needle, Disp, (SYRINGE 3CC/25GX1) 25G X 1 3 ML MISC Use for Vitamin b12 injection 100 each 0   tirzepatide 5 MG/0.5ML injection vial Inject into the skin once a week.     Ustekinumab -stba  (STEQEYMA ) 90 MG/ML SOSY Inject 90 mg into the skin every 30 (thirty) days. 1 mL 11   VITAMIN D  PO Take 5,000 Int'l Units by mouth in the morning and at bedtime.     No current facility-administered medications for this visit.     ALLERGIES: Iron , Iron  sucrose, Ibuprofen, Morphine  and codeine, Ofatumumab , Topamax [topiramate], Adhesive [tape], and Prednisone  Family History  Problem Relation Age of Onset   Hypertension Mother    Breast cancer Mother 61   Bone cancer Mother        metastatic from breast   Depression Mother    Hypothyroidism Mother    Colon polyps Father    Diabetes Father        borderline--resolved 2020   Hypertension Father        off meds now   Hyperlipidemia Father        off meds now   Fuch's dystrophy Father    Multiple sclerosis Sister        affecting brain only   Alcohol abuse Sister    Fuch's dystrophy Sister    Hypertension Sister    Heart disease Sister        poss MI in 30's   Stroke Maternal Grandmother    Lung cancer Maternal Grandmother    Hypertension Maternal Grandmother    Heart disease Paternal Grandmother    Hypertension Paternal Grandmother    Colon cancer Paternal Grandfather    Stomach cancer Neg Hx    Esophageal cancer Neg Hx    Pancreatic cancer Neg Hx     Social History   Socioeconomic History   Marital status: Married    Spouse name: Debby Hammersmith   Number of children: 0   Years of education: 14   Highest education level: Associate degree: occupational, scientist, product/process development, or vocational program  Occupational History   Occupation: Theatre Manager: [employer]  Tobacco Use   Smoking status: Former    Current packs/day: 0.00    Average packs/day: 1 pack/day for 4.0 years (4.0 ttl pk-yrs)    Types: Cigarettes    Start date: 11/18/1994    Quit date: 11/18/1998    Years since quitting: 25.9   Smokeless tobacco: Never  Vaping Use   Vaping status: Never Used  Substance and Sexual Activity   Alcohol use: Not  Currently   Drug use: Yes    Frequency: 4.0 times per week    Types: Marijuana    Comment: smokes marijuana most nights, bedtime   Sexual activity: Yes    Partners: Male    Birth control/protection: Condom  Other Topics Concern   Not on file  Social History Narrative   The patient is divorced.     Re-married Debby, partner of 10 years, on 10/10/16. 3 dogs, 1 cat. 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   Moved from [place]  to [place] in 2013.   Past smoker   No alcohol   2-3 caffeinated beverages a day   She reports she is compliant with sunscreen given her increased risk of sun damage and skin cancer (no longer on azathioprine )      Updated 01/2024   Social Drivers of Health   Tobacco Use: Medium Risk (10/17/2024)   Patient History    Smoking Tobacco Use: Former    Smokeless Tobacco Use: Never    Passive Exposure: Not on Actuary Strain: Not on file  Food Insecurity: Low Risk (12/12/2023)   Received from [facility]   Epic    Within the past 12 months, you worried that your food would run out before you got money to buy more: Never true    Within the past 12 months, the food you bought just didn't last and you didn't have money to get more. : Never true  Transportation Needs: No Transportation Needs (12/12/2023)   Received from [facility]    In the past 12 months, has lack of reliable transportation kept you from medical appointments, meetings, work or from getting things needed for daily living? : No  Physical Activity: Not on file  Stress: Not on file  Social Connections: Unknown (05/10/2023)   [facility] from [facility]   Social Network    Social Network: Not on file  Intimate Partner Violence: Unknown (05/10/2023)   Received from [facility]   HITS    Physically Hurt: Not on file    Insult or Talk Down To: Not on file    Threaten Physical Harm: Not  on file    Scream or Curse: Not on file  Depression (PHQ2-9): Low Risk (02/01/2024)   Depression (PHQ2-9)    PHQ-2 Score: 3  Alcohol Screen: Not on file  Housing: Low Risk (12/12/2023)   Received from [facility]   Epic    What is your living situation today?: I have a steady place to live    Think about the place you live. Do you have problems with any of the following? Choose all that apply:: None/None on this list  Utilities: Low Risk (12/12/2023)   Received from [facility]   Utilities    In the past 12 months has the electric, gas, oil, or water company threatened to shut off  services in your home? : No  Health Literacy: Not on file    Review of Systems  PHYSICAL EXAMINATION:   BP 102/64 (BP Location: Left Arm, Patient Position: Sitting, Cuff Size: Normal)   Pulse 89   Wt 179 lb (81.2 kg)   LMP 10/06/2024 (Exact Date)   SpO2 98%   BMI 30.73 kg/m     General appearance: alert, cooperative and appears stated age Head: Normocephalic, without obvious abnormality, atraumatic Neck: no adenopathy, supple, symmetrical, trachea midline and thyroid normal to inspection and palpation Lungs: clear to auscultation bilaterally Breasts: normal appearance, no masses or tenderness, No nipple retraction or dimpling, No nipple discharge or bleeding, No axillary or supraclavicular adenopathy Heart: regular rate and rhythm Abdomen: soft, non-tender, no masses,  no organomegaly Extremities: extremities normal, atraumatic, no cyanosis or edema Skin: Skin color, texture, turgor normal. No rashes or lesions Lymph nodes: Cervical, supraclavicular, and axillary nodes normal. No abnormal inguinal nodes palpated Neurologic: Grossly normal  Pelvic: External genitalia:  no lesions              Urethra:  normal appearing urethra with no masses, tenderness or lesions              Bartholins and Skenes: normal                 Vagina: normal appearing vagina with normal color and discharge, no  lesions              Cervix: no lesions                Bimanual Exam:  Uterus:  normal size, contour, position, consistency, mobility, non-tender              Adnexa: no mass, fullness, tenderness              Rectal exam: {yes no:314532}.  Confirms.              Anus:  normal sphincter tone, no lesions  Chaperone was present for exam:  {BSCHAPERONE:31226::Emily F, CMA}  ASSESSMENT:  Hx LEEP, 2009.  Hx DVT 2019 upper extremity.  Hx IVC filter.   On Eliquis . Ileostomy.  Crohn's. MS.  PLAN:    {LABS (Optional):23779}  ***  total time was spent for this patient encounter, including preparation, face-to-face counseling with the patient, coordination of care, and documentation of the encounter.

## 2024-10-17 NOTE — Patient Instructions (Signed)
Etonogestrel Implant What is this medication? ETONOGESTREL (et oh noe JES trel) prevents ovulation and pregnancy. It belongs to a group of medications called contraceptives. This medication is a progestin hormone. This medicine may be used for other purposes; ask your health care provider or pharmacist if you have questions. COMMON BRAND NAME(S): Implanon, Nexplanon What should I tell my care team before I take this medication? They need to know if you have any of these conditions: Abnormal vaginal bleeding Blood clots Blood vessel disease Breast, cervical, endometrial, ovarian, liver, or uterine cancer Diabetes Gallbladder disease Heart disease or recent heart attack High blood pressure High cholesterol or triglycerides Kidney disease Liver disease Migraine headaches Seizures Stroke Tobacco use An unusual or allergic reaction to etonogestrel, other medications, foods, dyes, or preservatives Pregnant or trying to get pregnant Breastfeeding How should I use this medication? This device is inserted just under the skin on the inner side of your upper arm by your care team. Talk to your care team about the use of this medication in children. Special care may be needed. Overdosage: If you think you have taken too much of this medicine contact a poison control center or emergency room at once. NOTE: This medicine is only for you. Do not share this medicine with others. What if I miss a dose? This does not apply. What may interact with this medication? Do not take this medication with any of the following: Amprenavir Fosamprenavir This medication may also interact with the following: Acitretin Aprepitant Armodafinil Bexarotene Bosentan Carbamazepine Certain antivirals for HIV or hepatitis Certain medications for fungal infections, such as fluconazole, ketoconazole, itraconazole, or  voriconazole Cyclosporine Felbamate Griseofulvin Lamotrigine Modafinil Oxcarbazepine Phenobarbital Phenytoin Primidone Rifabutin Rifampin Rifapentine St. John's wort Topiramate This list may not describe all possible interactions. Give your health care provider a list of all the medicines, herbs, non-prescription drugs, or dietary supplements you use. Also tell them if you smoke, drink alcohol, or use illegal drugs. Some items may interact with your medicine. What should I watch for while using this medication? Visit your care team for regular checks on your progress. Using this medication does not protect you or your partner against HIV or other sexually transmitted infections (STIs). You should be able to feel the implant by pressing your fingertips over the skin where it was inserted. Contact your care team if you cannot feel the implant, and use a non-hormonal birth control method (such as condoms) until your care team confirms that the implant is in place. Contact your care team if you think that the implant may have broken or become bent while in your arm. You will receive a user card from your care team after the implant is inserted. The card is a record of the location of the implant in your upper arm and when it should be removed. Keep this card with your health records. What side effects may I notice from receiving this medication? Side effects that you should report to your care team as soon as possible: Allergic reactions--skin rash, itching, hives, swelling of the face, lips, tongue, or throat Blood clot--pain, swelling, or warmth in the leg, shortness of breath, chest pain Gallbladder problems--severe stomach pain, nausea, vomiting, fever Increase in blood pressure Liver injury--right upper belly pain, loss of appetite, nausea, light-colored stool, dark yellow or brown urine, yellowing skin or eyes, unusual weakness or fatigue New or worsening migraines or headaches Pain,  redness, or irritation at injection site Stroke--sudden numbness or weakness of the face, arm,  or leg, trouble speaking, confusion, trouble walking, loss of balance or coordination, dizziness, severe headache, change in vision Unusual vaginal discharge, itching, or odor Worsening mood, feelings of depression Side effects that usually do not require medical attention (report to your care team if they continue or are bothersome): Breast pain or tenderness Dark patches of skin on the face or other sun-exposed areas Irregular menstrual cycles or spotting Nausea Weight gain This list may not describe all possible side effects. Call your doctor for medical advice about side effects. You may report side effects to FDA at 1-800-FDA-1088. Where should I keep my medication? This medication is given in a hospital or clinic and will not be stored at home. NOTE: This sheet is a summary. It may not cover all possible information. If you have questions about this medicine, talk to your doctor, pharmacist, or health care provider.  2024 Elsevier/Gold Standard (2022-05-25 00:00:00)

## 2024-10-22 ENCOUNTER — Ambulatory Visit: Admitting: Obstetrics and Gynecology

## 2024-11-06 ENCOUNTER — Ambulatory Visit (INDEPENDENT_AMBULATORY_CARE_PROVIDER_SITE_OTHER): Admitting: Internal Medicine

## 2024-11-06 ENCOUNTER — Encounter: Payer: Self-pay | Admitting: Internal Medicine

## 2024-11-06 VITALS — BP 110/72 | HR 95 | Ht 64.0 in | Wt 176.5 lb

## 2024-11-06 DIAGNOSIS — K435 Parastomal hernia without obstruction or  gangrene: Secondary | ICD-10-CM | POA: Diagnosis not present

## 2024-11-06 DIAGNOSIS — D509 Iron deficiency anemia, unspecified: Secondary | ICD-10-CM

## 2024-11-06 DIAGNOSIS — Z932 Ileostomy status: Secondary | ICD-10-CM

## 2024-11-06 DIAGNOSIS — K5000 Crohn's disease of small intestine without complications: Secondary | ICD-10-CM

## 2024-11-06 DIAGNOSIS — K469 Unspecified abdominal hernia without obstruction or gangrene: Secondary | ICD-10-CM

## 2024-11-06 DIAGNOSIS — Z7960 Long term (current) use of unspecified immunomodulators and immunosuppressants: Secondary | ICD-10-CM | POA: Diagnosis not present

## 2024-11-06 NOTE — Progress Notes (Signed)
 "       Lindsay Mccarthy 49 y.o. Mar 13, 1976 [id number]  Assessment & Plan:   Encounter Diagnoses  Name Primary?   Crohn's disease of small intestine without complication (HCC) Yes   Long-term use of immunosuppressant medication    Iron  deficiency anemia, unspecified iron  deficiency anemia type    Ileostomy in place [facility])    Peristomal hernia    Patient is doing well on current therapy with ustekinumab  every 4 weeks.  She will continue.  We will get drug levels and antibody levels prior to the next injection which is due on February 1.  Also check CBC iron  studies and CMP.  She is continuing ferrous sulfate  for iron  deficiency anemia.  Monitor the peristomal hernia  She asked about potentially having colectomy if she ever had to have surgery again to avoid the intermittent discharge of mucus.  Her colon has been normal so I am not sure that would be an option but would defer that to the future should surgery ever be needed.  Anticipate routine follow-up in this clinic in 6 months.    Subjective:   Chief Complaint: Follow-up of Crohn's disease of the small bowel  HPI 49 year old woman with a history of Crohn's disease of small bowel status post 2 resections now with ileostomy, on ustekinumab , also with a history of multiple sclerosis not on therapy, and on Eliquis  who presents for a  follow-up.  She was last seen here in May 2025. Fecal calprotectin level was normal at 42 then.  She reports that she continues to have good control of her Crohn's disease, ileostomy output has slowed somewhat with the use of tirzepatide, no abdominal pain no bloody ostomy output.  Multiple sclerosis is in observation mode, her neurologist (notes reviewed) thinks ustekinumab  may have some benefit, and she is monitoring things.  She has her support dog with her today.  Tirzepatide has been very helpful regarding loss of weight    Allergies[1] Active Medications[2] Past Medical History:  Diagnosis Date    Anemia    related to Crohns flares   Arm DVT (deep venous thromboembolism), acute, right (HCC) 11/29/2017   Arthritis    knees, feet, hands, wrists, related to Crohns flare   Asthma    Childhood   B12 deficiency    monitored/treated by Dr. Avram   Cervical dysplasia    Clotting disorder    on eliquis  hx dvt/ pulmonary embolism   COVID 04/13/2021   Crohn disease (HCC)    Crohn's disease of small intestine (HCC) 06/24/2012   Diagnosed 1999, in [place] . Ileitis only then. Treated with Imuran  Remicade and prednisone.  Noncompliant with therapy. 2004 return to care and was treated with Remicade prednisone Pentasa Cipro and Flagyl. 2006 status post right hemicolectomy. Subsequently treated with azathioprine  and Cimzia . 200 mg every other week.  azathioprine  was added in 2010. Prometheus TP MT enzyme was negative.   Depression    DVT of upper extremity (deep vein thrombosis) (HCC) 11/28/2017   Eczema    arms and behind knees, worse in winter   Generalized anxiety disorder    History of recurrent UTI (urinary tract infection)    HPV (human papilloma virus) infection    Hyperlipemia    Hypertriglyceridemia 07/03/2012   10/2010 labs showed level of 753 with total cholesterol 180 and HDL 41' Labs 05/2013--TG 180    Major depressive disorder in remission 10/03/2013   MS (multiple sclerosis)    Multiple sclerosis 2019   Officially diagnosed  in the Fall   Neuromuscular disorder (HCC)    Osteopenia 09/29/2011   T-1.6   Papanicolaou smear 10/2011   last abnormal 2011   Pulmonary embolism (HCC) 11/30/2017   Scaphoid fracture of wrist 2013   left   Seasonal allergic rhinitis    Shingles 09/2015   R hip   Vitamin B12 deficiency 06/19/2012   Vitamin D  deficiency 12/16/2015   Vitamin D -OH level 12    Past Surgical History:  Procedure Laterality Date   APPENDECTOMY     CERVICAL BIOPSY  W/ LOOP ELECTRODE EXCISION  11/02/2007   ---paps normal since   CERVICAL DISC ARTHROPLASTY   12/12/2023   C4-C5, Dr. Vonna ([facility])   COLONOSCOPY  multiple   scanned   ESOPHAGOGASTRODUODENOSCOPY  multiple   scanned   HEMICOLECTOMY  11/01/2004   ILEOCECETOMY  12/11/2021   and [facility]; [facility]   IR ANGIOGRAM PULMONARY BILATERAL SELECTIVE  12/03/2017   IR ANGIOGRAM SELECTIVE EACH ADDITIONAL VESSEL  12/03/2017   IR ANGIOGRAM SELECTIVE EACH ADDITIONAL VESSEL  12/03/2017   IR INFUSION THROMBOL ARTERIAL INITIAL (MS)  12/03/2017   IR INFUSION THROMBOL ARTERIAL INITIAL (MS)  12/03/2017   IR IVC FILTER PLMT / S&I /IMG GUID/MOD SED  12/04/2017   IR THROMB F/U EVAL ART/VEN FINAL DAY (MS)  12/04/2017   IR US  GUIDE VASC ACCESS RIGHT  12/03/2017   IVC FILTER REMOVAL N/A 07/11/2018   Procedure: IVC FILTER REMOVAL;  Surgeon: Serene Gaile ORN, MD;  [facility];  Service: Cardiovascular;  Laterality: N/A;   IVC VENOGRAPHY N/A 07/11/2018   Procedure: IVC Venography;  Surgeon: Serene Gaile ORN, MD;  [facility];  Service: Cardiovascular;  Laterality: N/A;   LEEP  11/01/2009   --done in [place]    PERIPHERAL VASCULAR BALLOON ANGIOPLASTY Right 11/30/2017   Procedure: PERIPHERAL VASCULAR BALLOON ANGIOPLASTY;  Surgeon: Serene Gaile ORN, MD;  [facility];  Service: Cardiovascular;  Laterality: Right;   PERIPHERAL VASCULAR THROMBECTOMY N/A 11/29/2017   Procedure: PERIPHERAL VASCULAR THROMBECTOMY - THROMBOLYSIS;  Surgeon: Serene Gaile ORN, MD;  [facility];  Service: Cardiovascular;  Laterality: N/A;  LYSIS CATHETER PLACEMENT ONLY   PERIPHERAL VASCULAR THROMBECTOMY N/A 11/30/2017   Procedure: PERIPHERAL VASCULAR THROMBECTOMY - Lysis Recheck;  Surgeon: Serene Gaile ORN, MD;  [facility];  Service: Cardiovascular;  Laterality: N/A;   WISDOM TOOTH EXTRACTION     Social History   Social History Narrative   The patient is divorced.     Re-married Debby, partner of 10 years, on 10/10/16. 3 dogs, 1 cat. 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   Moved from [place]  to [place] in 2013.   Past smoker   No alcohol   2-3 caffeinated beverages a day   She reports she is compliant with sunscreen given her increased risk of sun damage and skin cancer (no longer on azathioprine )      Updated 01/2024   family history includes Alcohol abuse in her sister; Bone cancer in her mother; Breast cancer (age of onset: 47) in her mother; Colon cancer in her paternal grandfather; Colon polyps in her father; Depression in her mother; Diabetes in her father; Fuch's dystrophy in her father and sister; Heart disease in her paternal grandmother and sister; Hyperlipidemia in her father; Hypertension in her father, maternal grandmother, mother, paternal grandmother, and sister; Hypothyroidism in  her mother; Lung cancer in her maternal grandmother; Multiple sclerosis in her sister; Stroke in her maternal grandmother.   Review of Systems   Objective:   Physical Exam BP 110/72   Pulse 95   Ht 5' 4 (1.626 m)   Wt 176 lb 8 oz (80.1 kg)   LMP 10/06/2024 (Exact Date)   BMI 30.30 kg/m  NAD Lungs cta Cor S1S2 no rmg Abd soft NT ileostomy RLQ w/ sl parastomal herniation     [1]  Allergies Allergen Reactions   Iron  Other (See Comments)    Blood in stool/Rectal Bleeding   Iron  Sucrose Other (See Comments)    Fever, infusion reaction  Flu like symptoms   Ibuprofen Other (See Comments)    Avoids due to Crohns disease   Morphine  And Codeine Itching    itchy   Ofatumumab  Other (See Comments)    Crohn's flare   Topamax [Topiramate] Other (See Comments)    Headache (only took x 3d)   Adhesive [Tape] Rash    Rash with electrodes   Prednisone Other (See Comments) and Anxiety    crazy, hallucinations Tolerates methylprednisolone  hallucinations  [2]  Current Meds  Medication Sig   acetaminophen  (TYLENOL ) 500 MG tablet Take 1,000 mg by  mouth every 6 (six) hours as needed for headache (pain). Reported on 12/15/2015   baclofen  (LIORESAL ) 10 MG tablet TAKE 1 TABLET(10 MG) BY MOUTH THREE TIMES DAILY AS NEEDED FOR MUSCLE SPASMS   buPROPion  (WELLBUTRIN ) 100 MG tablet TAKE 1 TABLET(100 MG) BY MOUTH TWICE DAILY   cetirizine (ZYRTEC) 10 MG tablet Take 10 mg by mouth at bedtime.    cyanocobalamin  (DODEX ) 1000 MCG/ML injection INJECT 1 ML INTO THE SKIN EVERY 14 DAYS   cyanocobalamin  (VITAMIN B12) 1000 MCG/ML injection Inject 1ml every 2 weeks   dalfampridine  10 MG TB12 Take 1 tablet (10 mg total) by mouth every 12 (twelve) hours.   diphenoxylate -atropine  (LOMOTIL ) 2.5-0.025 MG tablet Take 1 to 2 tablets by mouth every 6 to 8 hours as needed for diarrhea   ELIQUIS  5 MG TABS tablet TAKE 1 TABLET BY MOUTH TWICE  DAILY   escitalopram  (LEXAPRO ) 10 MG tablet TAKE 1 TABLET BY MOUTH AT  BEDTIME   fenofibrate  160 MG tablet TAKE 1 TABLET BY MOUTH AT  BEDTIME   ferrous sulfate  325 (65 FE) MG EC tablet Take 325 mg by mouth daily.   gabapentin  (NEURONTIN ) 100 MG capsule Take 3 capsules (300 mg total) by mouth at bedtime.   Insulin  Pen Needle 33G X 5 MM MISC 1 each by Does not apply route every 14 (fourteen) days.   NEEDLE, DISP, 25 G (BD DISP NEEDLES) 25G X 5/8 MISC Use with vitamin B12 injection   NON FORMULARY Take 1 each by mouth at bedtime. CBD/ cannabis   rosuvastatin  (CRESTOR ) 5 MG tablet Take 1 tablet (5 mg total) by mouth daily.   Syringe/Needle, Disp, (SYRINGE 3CC/25GX1) 25G X 1 3 ML MISC Use for Vitamin b12 injection   tirzepatide 5 MG/0.5ML injection vial Inject into the skin once a week.   Ustekinumab -stba (STEQEYMA ) 90 MG/ML SOSY Inject 90 mg into the skin every 30 (thirty) days.   VITAMIN D  PO Take 5,000 Int'l Units by mouth in the morning and at bedtime.   "

## 2024-11-06 NOTE — Patient Instructions (Signed)
 Your provider has requested that you go to the basement level for lab work before leaving today. Press B on the elevator. The lab is located at the first door on the left as you exit the elevator.  Due to recent changes in healthcare laws, you may see the results of your imaging and laboratory studies on MyChart before your provider has had a chance to review them.  We understand that in some cases there may be results that are confusing or concerning to you. Not all laboratory results come back in the same time frame and the provider may be waiting for multiple results in order to interpret others.  Please give us  48 hours in order for your provider to thoroughly review all the results before contacting the office for clarification of your results.   Please call in May for a July appointment.   I appreciate the opportunity to care for you. Lupita Commander, MD, [facility]

## 2024-11-12 ENCOUNTER — Ambulatory Visit: Admitting: Neurology

## 2024-11-30 ENCOUNTER — Other Ambulatory Visit

## 2024-11-30 DIAGNOSIS — Z7960 Long term (current) use of unspecified immunomodulators and immunosuppressants: Secondary | ICD-10-CM | POA: Diagnosis not present

## 2024-11-30 DIAGNOSIS — D509 Iron deficiency anemia, unspecified: Secondary | ICD-10-CM | POA: Diagnosis not present

## 2024-11-30 DIAGNOSIS — K5000 Crohn's disease of small intestine without complications: Secondary | ICD-10-CM

## 2024-11-30 LAB — CBC WITH DIFFERENTIAL/PLATELET
Basophils Absolute: 0 10*3/uL (ref 0.0–0.1)
Basophils Relative: 0.4 % (ref 0.0–3.0)
Eosinophils Absolute: 0.1 10*3/uL (ref 0.0–0.7)
Eosinophils Relative: 0.8 % (ref 0.0–5.0)
HCT: 42.3 % (ref 36.0–46.0)
Hemoglobin: 14.1 g/dL (ref 12.0–15.0)
Lymphocytes Relative: 20.9 % (ref 12.0–46.0)
Lymphs Abs: 1.6 10*3/uL (ref 0.7–4.0)
MCHC: 33.4 g/dL (ref 30.0–36.0)
MCV: 90.4 fl (ref 78.0–100.0)
Monocytes Absolute: 0.8 10*3/uL (ref 0.1–1.0)
Monocytes Relative: 10.2 % (ref 3.0–12.0)
Neutro Abs: 5.1 10*3/uL (ref 1.4–7.7)
Neutrophils Relative %: 67.7 % (ref 43.0–77.0)
Platelets: 371 10*3/uL (ref 150.0–400.0)
RBC: 4.68 Mil/uL (ref 3.87–5.11)
RDW: 13.7 % (ref 11.5–15.5)
WBC: 7.6 10*3/uL (ref 4.0–10.5)

## 2024-11-30 LAB — COMPREHENSIVE METABOLIC PANEL WITH GFR
ALT: 22 U/L (ref 3–35)
AST: 21 U/L (ref 5–37)
Albumin: 4.5 g/dL (ref 3.5–5.2)
Alkaline Phosphatase: 51 U/L (ref 39–117)
BUN: 17 mg/dL (ref 6–23)
CO2: 26 meq/L (ref 19–32)
Calcium: 9.5 mg/dL (ref 8.4–10.5)
Chloride: 100 meq/L (ref 96–112)
Creatinine, Ser: 1.03 mg/dL (ref 0.40–1.20)
GFR: 65.04 mL/min
Glucose, Bld: 83 mg/dL (ref 70–99)
Potassium: 3.9 meq/L (ref 3.5–5.1)
Sodium: 136 meq/L (ref 135–145)
Total Bilirubin: 0.5 mg/dL (ref 0.2–1.2)
Total Protein: 7.7 g/dL (ref 6.0–8.3)

## 2024-12-05 ENCOUNTER — Encounter: Payer: Self-pay | Admitting: Obstetrics and Gynecology

## 2024-12-05 ENCOUNTER — Ambulatory Visit (INDEPENDENT_AMBULATORY_CARE_PROVIDER_SITE_OTHER): Admitting: Obstetrics and Gynecology

## 2024-12-05 VITALS — BP 104/60 | HR 78

## 2024-12-05 DIAGNOSIS — Z01812 Encounter for preprocedural laboratory examination: Secondary | ICD-10-CM

## 2024-12-05 DIAGNOSIS — Z308 Encounter for other contraceptive management: Secondary | ICD-10-CM

## 2024-12-05 DIAGNOSIS — Z30017 Encounter for initial prescription of implantable subdermal contraceptive: Secondary | ICD-10-CM

## 2024-12-05 LAB — PREGNANCY, URINE: Preg Test, Ur: NEGATIVE

## 2024-12-05 NOTE — Patient Instructions (Signed)
Etonogestrel Implant What is this medication? ETONOGESTREL (et oh noe JES trel) prevents ovulation and pregnancy. It belongs to a group of medications called contraceptives. This medication is a progestin hormone. This medicine may be used for other purposes; ask your health care provider or pharmacist if you have questions. COMMON BRAND NAME(S): Implanon, Nexplanon What should I tell my care team before I take this medication? They need to know if you have any of these conditions: Abnormal vaginal bleeding Blood clots Blood vessel disease Breast, cervical, endometrial, ovarian, liver, or uterine cancer Diabetes Gallbladder disease Heart disease or recent heart attack High blood pressure High cholesterol or triglycerides Kidney disease Liver disease Migraine headaches Seizures Stroke Tobacco use An unusual or allergic reaction to etonogestrel, other medications, foods, dyes, or preservatives Pregnant or trying to get pregnant Breastfeeding How should I use this medication? This device is inserted just under the skin on the inner side of your upper arm by your care team. Talk to your care team about the use of this medication in children. Special care may be needed. Overdosage: If you think you have taken too much of this medicine contact a poison control center or emergency room at once. NOTE: This medicine is only for you. Do not share this medicine with others. What if I miss a dose? This does not apply. What may interact with this medication? Do not take this medication with any of the following: Amprenavir Fosamprenavir This medication may also interact with the following: Acitretin Aprepitant Armodafinil Bexarotene Bosentan Carbamazepine Certain antivirals for HIV or hepatitis Certain medications for fungal infections, such as fluconazole, ketoconazole, itraconazole, or  voriconazole Cyclosporine Felbamate Griseofulvin Lamotrigine Modafinil Oxcarbazepine Phenobarbital Phenytoin Primidone Rifabutin Rifampin Rifapentine St. John's wort Topiramate This list may not describe all possible interactions. Give your health care provider a list of all the medicines, herbs, non-prescription drugs, or dietary supplements you use. Also tell them if you smoke, drink alcohol, or use illegal drugs. Some items may interact with your medicine. What should I watch for while using this medication? Visit your care team for regular checks on your progress. Using this medication does not protect you or your partner against HIV or other sexually transmitted infections (STIs). You should be able to feel the implant by pressing your fingertips over the skin where it was inserted. Contact your care team if you cannot feel the implant, and use a non-hormonal birth control method (such as condoms) until your care team confirms that the implant is in place. Contact your care team if you think that the implant may have broken or become bent while in your arm. You will receive a user card from your care team after the implant is inserted. The card is a record of the location of the implant in your upper arm and when it should be removed. Keep this card with your health records. What side effects may I notice from receiving this medication? Side effects that you should report to your care team as soon as possible: Allergic reactions--skin rash, itching, hives, swelling of the face, lips, tongue, or throat Blood clot--pain, swelling, or warmth in the leg, shortness of breath, chest pain Gallbladder problems--severe stomach pain, nausea, vomiting, fever Increase in blood pressure Liver injury--right upper belly pain, loss of appetite, nausea, light-colored stool, dark yellow or brown urine, yellowing skin or eyes, unusual weakness or fatigue New or worsening migraines or headaches Pain,  redness, or irritation at injection site Stroke--sudden numbness or weakness of the face, arm,  or leg, trouble speaking, confusion, trouble walking, loss of balance or coordination, dizziness, severe headache, change in vision Unusual vaginal discharge, itching, or odor Worsening mood, feelings of depression Side effects that usually do not require medical attention (report to your care team if they continue or are bothersome): Breast pain or tenderness Dark patches of skin on the face or other sun-exposed areas Irregular menstrual cycles or spotting Nausea Weight gain This list may not describe all possible side effects. Call your doctor for medical advice about side effects. You may report side effects to FDA at 1-800-FDA-1088. Where should I keep my medication? This medication is given in a hospital or clinic and will not be stored at home. NOTE: This sheet is a summary. It may not cover all possible information. If you have questions about this medicine, talk to your doctor, pharmacist, or health care provider.  2024 Elsevier/Gold Standard (2022-05-25 00:00:00)

## 2024-12-05 NOTE — Progress Notes (Signed)
 "  GYNECOLOGY  VISIT   HPI: 49 y.o.   Married  Caucasian female   [id number] with Patient's last menstrual period was 11/18/2024 (exact date).   here for: Nexplanon insertion      UPT negative.   GYNECOLOGIC HISTORY: Patient's last menstrual period was 11/18/2024 (exact date). Contraception:  Condoms  Menopausal hormone therapy:  n/a Last 2 paps:  06/22/22 neg, HR HPV neg, 04/27/19 neg HPV neg  History of abnormal Pap or positive HPV:  no Mammogram:  12/27/23 [facility] Cat 1 neg         OB History     Gravida  0   Para  0   Term  0   Preterm  0   AB  0   Living  0      SAB  0   IAB  0   Ectopic  0   Multiple  0   Live Births                 Patient Active Problem List   Diagnosis Date Noted   Encounter for contraceptive management 02/04/2024   Class 1 obesity due to excess calories with body mass index (BMI) of 34.0 to 34.9 in adult 02/04/2024   Ileostomy in place [facility]) 06/22/2023   Crohn's disease of colon with intestinal obstruction (HCC) 11/12/2021   History of DVT (deep vein thrombosis) 11/12/2021   Iron  deficiency anemia 08/22/2020   Multiple falls 08/21/2019   Hematoma of right flank 08/21/2019   Anticoagulant long-term use 07/17/2019   Multiple sclerosis 12/19/2018   Pulmonary nodule 07/19/2018   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   Dvt femoral (deep venous thrombosis) (HCC) 11/30/2017   Arm DVT (deep venous thromboembolism), acute, right (HCC) 11/29/2017   Pulmonary embolus (HCC) 11/27/2017   Iron  deficiency anemia due to chronic blood loss 10/05/2017   Heartburn 10/05/2017   Other fatigue 10/05/2017   Vitamin D  deficiency 12/16/2015   Major depressive disorder in remission 10/03/2013   Hypertriglyceridemia 07/03/2012   Crohn's disease of small intestine (HCC) 06/24/2012   Long-term use of immunosuppressant medication Entyvio  + ocrelizumab  06/24/2012   Vitamin B12 deficiency 06/19/2012   Osteopenia of femur neck T. score -1.4 09/29/2011     Past Medical History:  Diagnosis Date   Anemia    related to Crohns flares   Arm DVT (deep venous thromboembolism), acute, right (HCC) 11/29/2017   Arthritis    knees, feet, hands, wrists, related to Crohns flare   Asthma    Childhood   B12 deficiency    monitored/treated by Dr. Avram   Cervical dysplasia    Clotting disorder    on eliquis  hx dvt/ pulmonary embolism   COVID 04/13/2021   Crohn disease (HCC)    Crohn's disease of small intestine (HCC) 06/24/2012   Diagnosed 1999, in [place] . Ileitis only then. Treated with Imuran  Remicade and prednisone.  Noncompliant with therapy. 2004 return to care and was treated with Remicade prednisone Pentasa Cipro and Flagyl. 2006 status post right hemicolectomy. Subsequently treated with azathioprine  and Cimzia . 200 mg every other week.  azathioprine  was added in 2010. Prometheus TP MT enzyme was negative.   Depression    DVT of upper extremity (deep vein thrombosis) (HCC) 11/28/2017   Eczema    arms and behind knees, worse in winter   Generalized anxiety disorder    History of recurrent UTI (urinary tract infection)    HPV (human papilloma virus) infection    Hyperlipemia  Hypertriglyceridemia 07/03/2012   10/2010 labs showed level of 753 with total cholesterol 180 and HDL 41' Labs 05/2013--TG 180    Major depressive disorder in remission 10/03/2013   MS (multiple sclerosis)    Multiple sclerosis 2019   Officially diagnosed in the Fall   Neuromuscular disorder (HCC)    Osteopenia 09/29/2011   T-1.6   Papanicolaou smear 10/2011   last abnormal 2011   Pulmonary embolism (HCC) 11/30/2017   Scaphoid fracture of wrist 2013   left   Seasonal allergic rhinitis    Shingles 09/2015   R hip   Vitamin B12 deficiency 06/19/2012   Vitamin D  deficiency 12/16/2015   Vitamin D -OH level 12     Past Surgical History:  Procedure Laterality Date   APPENDECTOMY     CERVICAL BIOPSY  W/ LOOP ELECTRODE EXCISION  11/02/2007   ---paps  normal since   CERVICAL DISC ARTHROPLASTY  12/12/2023   C4-C5, Dr. Vonna ([facility])   COLONOSCOPY  multiple   scanned   ESOPHAGOGASTRODUODENOSCOPY  multiple   scanned   HEMICOLECTOMY  11/01/2004   ILEOCECETOMY  12/11/2021   and end [facility]; [facility]   IR ANGIOGRAM PULMONARY BILATERAL SELECTIVE  12/03/2017   IR ANGIOGRAM SELECTIVE EACH ADDITIONAL VESSEL  12/03/2017   IR ANGIOGRAM SELECTIVE EACH ADDITIONAL VESSEL  12/03/2017   IR INFUSION THROMBOL ARTERIAL INITIAL (MS)  12/03/2017   IR INFUSION THROMBOL ARTERIAL INITIAL (MS)  12/03/2017   IR IVC FILTER PLMT / S&I /IMG GUID/MOD SED  12/04/2017   IR THROMB F/U EVAL ART/VEN FINAL DAY (MS)  12/04/2017   IR US  GUIDE VASC ACCESS RIGHT  12/03/2017   IVC FILTER REMOVAL N/A 07/11/2018   Procedure: IVC FILTER REMOVAL;  Surgeon: Serene Gaile ORN, MD;  [facility];  Service: Cardiovascular;  Laterality: N/A;   IVC VENOGRAPHY N/A 07/11/2018   Procedure: IVC Venography;  Surgeon: Serene Gaile ORN, MD;  [facility];  Service: Cardiovascular;  Laterality: N/A;   LEEP  11/01/2009   --done in [place]    PERIPHERAL VASCULAR BALLOON ANGIOPLASTY Right 11/30/2017   Procedure: PERIPHERAL VASCULAR BALLOON ANGIOPLASTY;  Surgeon: Serene Gaile ORN, MD;  [facility];  Service: Cardiovascular;  Laterality: Right;   PERIPHERAL VASCULAR THROMBECTOMY N/A 11/29/2017   Procedure: PERIPHERAL VASCULAR THROMBECTOMY - THROMBOLYSIS;  Surgeon: Serene Gaile ORN, MD;  [facility];  Service: Cardiovascular;  Laterality: N/A;  LYSIS CATHETER PLACEMENT ONLY   PERIPHERAL VASCULAR THROMBECTOMY N/A 11/30/2017   Procedure: PERIPHERAL VASCULAR THROMBECTOMY - Lysis Recheck;  Surgeon: Serene Gaile ORN, MD;  [facility];  Service: Cardiovascular;  Laterality: N/A;   WISDOM TOOTH EXTRACTION      Current Outpatient Medications  Medication Sig Dispense Refill   acetaminophen  (TYLENOL ) 500 MG tablet Take  1,000 mg by mouth every 6 (six) hours as needed for headache (pain). Reported on 12/15/2015     baclofen  (LIORESAL ) 10 MG tablet TAKE 1 TABLET(10 MG) BY MOUTH THREE TIMES DAILY AS NEEDED FOR MUSCLE SPASMS 90 tablet 5   buPROPion  (WELLBUTRIN ) 100 MG tablet TAKE 1 TABLET(100 MG) BY MOUTH TWICE DAILY 180 tablet 2   cetirizine (ZYRTEC) 10 MG tablet Take 10 mg by mouth at bedtime.      cyanocobalamin  (DODEX ) 1000 MCG/ML injection INJECT 1 ML INTO THE SKIN EVERY 14 DAYS 10 mL 4   cyanocobalamin  (VITAMIN B12) 1000 MCG/ML injection Inject 1ml every 2 weeks 10 mL 2   dalfampridine  10 MG  TB12 Take 1 tablet (10 mg total) by mouth every 12 (twelve) hours. 60 tablet 5   diphenoxylate -atropine  (LOMOTIL ) 2.5-0.025 MG tablet Take 1 to 2 tablets by mouth every 6 to 8 hours as needed for diarrhea 30 tablet 1   ELIQUIS  5 MG TABS tablet TAKE 1 TABLET BY MOUTH TWICE  DAILY 180 tablet 3   escitalopram  (LEXAPRO ) 10 MG tablet TAKE 1 TABLET BY MOUTH AT  BEDTIME 90 tablet 3   fenofibrate  160 MG tablet TAKE 1 TABLET BY MOUTH AT  BEDTIME 90 tablet 3   ferrous sulfate  325 (65 FE) MG EC tablet Take 325 mg by mouth daily.     gabapentin  (NEURONTIN ) 100 MG capsule Take 3 capsules (300 mg total) by mouth at bedtime. 270 capsule 3   Insulin  Pen Needle 33G X 5 MM MISC 1 each by Does not apply route every 14 (fourteen) days. 100 each 2   NEEDLE, DISP, 25 G (BD DISP NEEDLES) 25G X 5/8 MISC Use with vitamin B12 injection 100 each 0   NON FORMULARY Take 1 each by mouth at bedtime. CBD/ cannabis     rosuvastatin  (CRESTOR ) 5 MG tablet Take 1 tablet (5 mg total) by mouth daily. 90 tablet 3   Syringe/Needle, Disp, (SYRINGE 3CC/25GX1) 25G X 1 3 ML MISC Use for Vitamin b12 injection 100 each 0   tirzepatide 5 MG/0.5ML injection vial Inject into the skin once a week.     Ustekinumab -stba (STEQEYMA ) 90 MG/ML SOSY Inject 90 mg into the skin every 30 (thirty) days. 1 mL 11   VITAMIN D  PO Take 5,000 Int'l Units by mouth in the morning and at  bedtime.     No current facility-administered medications for this visit.     ALLERGIES: Iron , Iron  sucrose, Ibuprofen, Morphine  and codeine, Ofatumumab , Topamax [topiramate], Adhesive [tape], and Prednisone  Family History  Problem Relation Age of Onset   Hypertension Mother    Breast cancer Mother 51   Bone cancer Mother        metastatic from breast   Depression Mother    Hypothyroidism Mother    Colon polyps Father    Diabetes Father        borderline--resolved 2020   Hypertension Father        off meds now   Hyperlipidemia Father        off meds now   Fuch's dystrophy Father    Multiple sclerosis Sister        affecting brain only   Alcohol abuse Sister    Fuch's dystrophy Sister    Hypertension Sister    Heart disease Sister        poss MI in 68's   Stroke Maternal Grandmother    Lung cancer Maternal Grandmother    Hypertension Maternal Grandmother    Heart disease Paternal Grandmother    Hypertension Paternal Grandmother    Colon cancer Paternal Grandfather    Stomach cancer Neg Hx    Esophageal cancer Neg Hx    Pancreatic cancer Neg Hx     Social History   Socioeconomic History   Marital status: Married    Spouse name: Debby Hammersmith   Number of children: 0   Years of education: 14   Highest education level: Associate degree: occupational, scientist, product/process development, or vocational program  Occupational History   Occupation: Theatre Manager: [employer]  Tobacco Use   Smoking status: Former    Current packs/day: 0.00    Average packs/day:  1 pack/day for 4.0 years (4.0 ttl pk-yrs)    Types: Cigarettes    Start date: 11/18/1994    Quit date: 11/18/1998    Years since quitting: 26.0   Smokeless tobacco: Never  Vaping Use   Vaping status: Never Used  Substance and Sexual Activity   Alcohol use: Not Currently   Drug use: Yes    Frequency: 4.0 times per week    Types: Marijuana    Comment: smokes marijuana most nights, bedtime   Sexual activity: Yes     Partners: Male    Birth control/protection: Condom  Other Topics Concern   Not on file  Social History Narrative   The patient is divorced.     Re-married Debby, partner of 10 years, on 10/10/16. 3 dogs, 1 cat. (Has a service dog ([place]pton)) As of 11/06/2024 service dog is named Pumpkin.   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   Moved from [place]  to [place] in 2013.   Past smoker   No alcohol   2-3 caffeinated beverages a day   She reports she is compliant with sunscreen given her increased risk of sun damage and skin cancer (no longer on azathioprine )      Updated 01/2024   Social Drivers of Health   Tobacco Use: Medium Risk (12/05/2024)   Patient History    Smoking Tobacco Use: Former    Smokeless Tobacco Use: Never    Passive Exposure: Not on Actuary Strain: Not on file  Food Insecurity: Low Risk (12/12/2023)   Received from [facility]   Epic    Within the past 12 months, you worried that your food would run out before you got money to buy more: Never true    Within the past 12 months, the food you bought just didn't last and you didn't have money to get more. : Never true  Transportation Needs: No Transportation Needs (12/12/2023)   Received from [facility]    In the past 12 months, has lack of reliable transportation kept you from medical appointments, meetings, work or from getting things needed for daily living? : No  Physical Activity: Not on file  Stress: Not on file  Social Connections: Unknown (05/10/2023)   [facility] from [facility]   Social Network    Social Network: Not on file  Intimate Partner Violence: Unknown (05/10/2023)   Received from [facility]   HITS    Physically Hurt: Not on file    Insult or Talk Down To: Not on file    Threaten Physical Harm: Not on file    Scream or Curse: Not on file  Depression (PHQ2-9): Low Risk (02/01/2024)   Depression (PHQ2-9)     PHQ-2 Score: 3  Alcohol Screen: Not on file  Housing: Low Risk (12/12/2023)   Received from [facility]   Epic    What is your living situation today?: I have a steady place to live    Think about the place you live. Do you have problems with any of the following? Choose all that apply:: None/None on this list  Utilities: Low Risk (12/12/2023)   Received from [facility]   Utilities    In the past 12 months has the electric, gas, oil, or water company threatened to shut off services in your home? : No  Health Literacy: Not on file    Review of Systems  All other systems reviewed and  are negative.   PHYSICAL EXAMINATION:   BP 104/60 (BP Location: Left Arm, Patient Position: Sitting)   Pulse 78   LMP 11/18/2024 (Exact Date)   SpO2 97%     General appearance: alert, cooperative and appears stated age   Nexplanon insertion.  Lot [id number], expiration 06/2026 Consent and time out done.  Landmarks identified.  Sterile prep with betadine.  Local 1% lidocaine , lot [id number], exp January 29, 2026. Nexplanon placed with out difficulty.  Skin cleansed of betadine.  Steristrips placed.  Patient and provider identified the Nexplanon.  Sterile gauze bandage placed.  No complications.  Minimal EBL.  Chaperone was present for exam:  Kari HERO, CMA  ASSESSMENT:  Nexplanon insertion.   PLAN:  Patient educated that Nexplanon is effective for pregnancy prevention for 5 years of use. Post insertion precautions given.  Fu in 4 weeks for Nexplanon check.         "

## 2025-01-01 ENCOUNTER — Ambulatory Visit: Admitting: Obstetrics and Gynecology

## 2025-02-20 ENCOUNTER — Encounter: Payer: Self-pay | Admitting: Family Medicine

## 2025-03-11 ENCOUNTER — Ambulatory Visit: Admitting: Neurology

## 2025-04-10 ENCOUNTER — Ambulatory Visit: Admitting: Obstetrics and Gynecology
